# Patient Record
Sex: Female | Born: 1940 | Race: Black or African American | Hispanic: No | Marital: Married | Smoking: Never smoker
Health system: Southern US, Community
[De-identification: ages and names within clinical notes are randomized; demographics above are authoritative.]

## PROBLEM LIST (undated history)

## (undated) DIAGNOSIS — D751 Secondary polycythemia: Secondary | ICD-10-CM

## (undated) DIAGNOSIS — K219 Gastro-esophageal reflux disease without esophagitis: Secondary | ICD-10-CM

## (undated) DIAGNOSIS — E119 Type 2 diabetes mellitus without complications: Secondary | ICD-10-CM

## (undated) DIAGNOSIS — B029 Zoster without complications: Secondary | ICD-10-CM

## (undated) DIAGNOSIS — E0590 Thyrotoxicosis, unspecified without thyrotoxic crisis or storm: Secondary | ICD-10-CM

## (undated) DIAGNOSIS — I10 Essential (primary) hypertension: Secondary | ICD-10-CM

## (undated) DIAGNOSIS — E785 Hyperlipidemia, unspecified: Secondary | ICD-10-CM

## (undated) DIAGNOSIS — J189 Pneumonia, unspecified organism: Secondary | ICD-10-CM

## (undated) HISTORY — DX: Pneumonia, unspecified organism: J18.9

## (undated) HISTORY — DX: Zoster without complications: B02.9

## (undated) HISTORY — PX: COLONOSCOPY: SHX174

## (undated) HISTORY — PX: DENTAL SURGERY: SHX609

## (undated) HISTORY — DX: Secondary polycythemia: D75.1

## (undated) HISTORY — DX: Essential (primary) hypertension: I10

## (undated) HISTORY — DX: Gastro-esophageal reflux disease without esophagitis: K21.9

## (undated) HISTORY — DX: Hyperlipidemia, unspecified: E78.5

## (undated) HISTORY — DX: Type 2 diabetes mellitus without complications: E11.9

## (undated) HISTORY — DX: Thyrotoxicosis, unspecified without thyrotoxic crisis or storm: E05.90

---

## 2004-05-18 ENCOUNTER — Emergency Department (HOSPITAL_COMMUNITY): Admission: EM | Admit: 2004-05-18 | Discharge: 2004-05-18 | Payer: Self-pay | Admitting: Family Medicine

## 2004-05-28 ENCOUNTER — Emergency Department (HOSPITAL_COMMUNITY): Admission: EM | Admit: 2004-05-28 | Discharge: 2004-05-28 | Payer: Self-pay | Admitting: Family Medicine

## 2008-06-03 ENCOUNTER — Other Ambulatory Visit: Admission: RE | Admit: 2008-06-03 | Discharge: 2008-06-03 | Payer: Self-pay | Admitting: Family Medicine

## 2013-01-24 DIAGNOSIS — R21 Rash and other nonspecific skin eruption: Secondary | ICD-10-CM | POA: Insufficient documentation

## 2013-10-23 ENCOUNTER — Encounter (HOSPITAL_COMMUNITY): Payer: Self-pay | Admitting: Emergency Medicine

## 2013-10-23 ENCOUNTER — Emergency Department (HOSPITAL_COMMUNITY)
Admission: EM | Admit: 2013-10-23 | Discharge: 2013-10-24 | Disposition: A | Discharge status: Home / Self Care | Payer: Medicare Other | Attending: Emergency Medicine | Admitting: Emergency Medicine

## 2013-10-23 ENCOUNTER — Emergency Department (INDEPENDENT_AMBULATORY_CARE_PROVIDER_SITE_OTHER)
Admission: EM | Admit: 2013-10-23 | Discharge: 2013-10-23 | Disposition: A | Discharge status: Nursing Home (Includes ICF) | Payer: Medicare Other | Source: Home / Self Care | Attending: Emergency Medicine | Admitting: Emergency Medicine

## 2013-10-23 DIAGNOSIS — N390 Urinary tract infection, site not specified: Secondary | ICD-10-CM

## 2013-10-23 DIAGNOSIS — E119 Type 2 diabetes mellitus without complications: Secondary | ICD-10-CM | POA: Insufficient documentation

## 2013-10-23 DIAGNOSIS — R030 Elevated blood-pressure reading, without diagnosis of hypertension: Secondary | ICD-10-CM | POA: Insufficient documentation

## 2013-10-23 LAB — URINALYSIS, ROUTINE W REFLEX MICROSCOPIC
Bilirubin Urine: NEGATIVE
Glucose, UA: >1000 mg/dL — AB
Ketones, ur: 40 mg/dL — AB
NITRITE: POSITIVE — AB
PROTEIN: 100 mg/dL — AB
Specific Gravity, Urine: 1.034 — ABNORMAL HIGH (ref 1.005–1.030)
UROBILINOGEN UA: 1 mg/dL (ref 0.0–1.0)
pH: 5.5 (ref 5.0–8.0)

## 2013-10-23 LAB — COMPREHENSIVE METABOLIC PANEL
ALT: 23 U/L (ref 0–35)
AST: 21 U/L (ref 0–37)
Albumin: 3.7 g/dL (ref 3.5–5.2)
Alkaline Phosphatase: 146 U/L — ABNORMAL HIGH (ref 39–117)
BUN: 16 mg/dL (ref 6–23)
CALCIUM: 10.3 mg/dL (ref 8.4–10.5)
CO2: 27 mEq/L (ref 19–32)
Chloride: 93 mEq/L — ABNORMAL LOW (ref 96–112)
Creatinine, Ser: 0.63 mg/dL (ref 0.50–1.10)
GFR calc Af Amer: >90 mL/min (ref >90)
GFR, EST NON AFRICAN AMERICAN: 87 mL/min — AB (ref >90)
Glucose, Bld: 415 mg/dL — ABNORMAL HIGH (ref 70–99)
Potassium: 3.7 mEq/L (ref 3.7–5.3)
SODIUM: 135 meq/L — AB (ref 137–147)
Total Bilirubin: 0.9 mg/dL (ref 0.3–1.2)
Total Protein: 8.7 g/dL — ABNORMAL HIGH (ref 6.0–8.3)

## 2013-10-23 LAB — CBC WITH DIFFERENTIAL/PLATELET
BASOS ABS: 0 10*3/uL (ref 0.0–0.1)
Basophils Relative: 0 % (ref 0–1)
EOS PCT: 0 % (ref 0–5)
Eosinophils Absolute: 0 10*3/uL (ref 0.0–0.7)
HCT: 44.6 % (ref 36.0–46.0)
Hemoglobin: 15.9 g/dL — ABNORMAL HIGH (ref 12.0–15.0)
LYMPHS PCT: 11 % — AB (ref 12–46)
Lymphs Abs: 2 10*3/uL (ref 0.7–4.0)
MCH: 31.3 pg (ref 26.0–34.0)
MCHC: 35.7 g/dL (ref 30.0–36.0)
MCV: 87.8 fL (ref 78.0–100.0)
Monocytes Absolute: 1.3 10*3/uL — ABNORMAL HIGH (ref 0.1–1.0)
Monocytes Relative: 7 % (ref 3–12)
Neutro Abs: 14.6 10*3/uL — ABNORMAL HIGH (ref 1.7–7.7)
Neutrophils Relative %: 82 % — ABNORMAL HIGH (ref 43–77)
PLATELETS: 482 10*3/uL — AB (ref 150–400)
RBC: 5.08 MIL/uL (ref 3.87–5.11)
RDW: 15.5 % (ref 11.5–15.5)
WBC: 17.8 10*3/uL — AB (ref 4.0–10.5)

## 2013-10-23 LAB — URINE MICROSCOPIC-ADD ON

## 2013-10-23 LAB — POCT URINALYSIS DIP (DEVICE)
Bilirubin Urine: NEGATIVE
Glucose, UA: >=1000 mg/dL — AB
KETONES UR: 40 mg/dL — AB
Nitrite: POSITIVE — AB
PH: 5.5 (ref 5.0–8.0)
PROTEIN: 100 mg/dL — AB
Specific Gravity, Urine: 1.01 (ref 1.005–1.030)
Urobilinogen, UA: 0.2 mg/dL (ref 0.0–1.0)

## 2013-10-23 LAB — POCT I-STAT, CHEM 8
BUN: 16 mg/dL (ref 6–23)
CREATININE: 0.7 mg/dL (ref 0.50–1.10)
Calcium, Ion: 1.23 mmol/L (ref 1.13–1.30)
Chloride: 99 mEq/L (ref 96–112)
Glucose, Bld: 420 mg/dL — ABNORMAL HIGH (ref 70–99)
HCT: 50 % — ABNORMAL HIGH (ref 36.0–46.0)
Hemoglobin: 17 g/dL — ABNORMAL HIGH (ref 12.0–15.0)
Potassium: 4.1 mEq/L (ref 3.7–5.3)
Sodium: 139 mEq/L (ref 137–147)
TCO2: 26 mmol/L (ref 0–100)

## 2013-10-23 LAB — CBG MONITORING, ED: Glucose-Capillary: 390 mg/dL — ABNORMAL HIGH (ref 70–99)

## 2013-10-23 MED ORDER — SODIUM CHLORIDE 0.9 % IV SOLN
INTRAVENOUS | Status: DC
  Filled 2013-10-23: qty 1

## 2013-10-23 MED ORDER — INSULIN ASPART 100 UNIT/ML ~~LOC~~ SOLN
10.0000 [IU] | Freq: Once | SUBCUTANEOUS | Status: AC
  Administered 2013-10-23: 10 [IU] via SUBCUTANEOUS
  Filled 2013-10-23: qty 1

## 2013-10-23 MED ORDER — SODIUM CHLORIDE 0.9 % IV BOLUS (SEPSIS)
1000.0000 mL | Freq: Once | INTRAVENOUS | Status: AC
  Administered 2013-10-23: 1000 mL via INTRAVENOUS

## 2013-10-23 NOTE — ED Notes (Signed)
Pt  Reports  Low  abd pain  With  Frequency  Of  Urination            Symptoms  Started  Yesterday      Also she  Noticed  Some  Blood  On         Tissue  After       Wiping

## 2013-10-23 NOTE — ED Provider Notes (Addendum)
Chief Complaint   Chief Complaint  Patient presents with  . Urinary Frequency    History of Present Illness   Brandi Mccormick is a 73 year old female who presents tonight with a one-month history of urinary frequency, lower abdominal pain, dysuria, urgency, and blood in the urine. She denies any fever, chills, nausea, or vomiting. She also has had a one-month history of polyuria, polydipsia and a 25 pound weight loss. She's felt dizzy and lightheaded. Her appetite has been good. There is a positive family history diabetes but she has no personal history of diabetes.  Review of Systems   Other than as noted above, the patient denies any of the following symptoms: General:  No fevers, chills, or sweats. GI:  No abdominal pain, back pain, nausea, vomiting, diarrhea, or constipation. GU:  No dysuria, frequency, urgency, hematuria, or incontinence. GYN:  No discharge, itching, vulvar pain or lesions, pelvic pain, or abnormal vaginal bleeding.  [place]   Past medical history, family history, social history, meds, and allergies were reviewed.    Physical Examination     Vital signs:  BP 171/98  Pulse 108  Temp(Src) 98.6 F (37 C) (Oral)  Resp 18  SpO2 97% Gen:  Alert, oriented, in no distress. Lungs:  Clear to auscultation, no wheezes, rales or rhonchi. Heart:  Regular rhythm, no gallop or murmer. Abdomen:  Flat and soft. There was slight suprapubic pain to palpation.  No guarding, or rebound.  No hepato-splenomegaly or mass.  Bowel sounds were normally active.  No hernia. Back:  No CVA tenderness.  Skin:  Clear, warm and dry.  Labs   Results for orders placed during the hospital encounter of 10/23/13  POCT URINALYSIS DIP (DEVICE)      Result Value Ref Range   Glucose, UA >=1000 (*) NEGATIVE mg/dL   Bilirubin Urine NEGATIVE  NEGATIVE   Ketones, ur 40 (*) NEGATIVE mg/dL   Specific Gravity, Urine 1.010  1.005 - 1.030   Hgb urine dipstick LARGE (*) NEGATIVE   pH 5.5  5.0 - 8.0    Protein, ur 100 (*) NEGATIVE mg/dL   Urobilinogen, UA 0.2  0.0 - 1.0 mg/dL   Nitrite POSITIVE (*) NEGATIVE   Leukocytes, UA TRACE (*) NEGATIVE  POCT I-STAT, CHEM 8      Result Value Ref Range   Sodium 139  137 - 147 mEq/L   Potassium 4.1  3.7 - 5.3 mEq/L   Chloride 99  96 - 112 mEq/L   BUN 16  6 - 23 mg/dL   Creatinine, Ser 0.70  0.50 - 1.10 mg/dL   Glucose, Bld 420 (*) 70 - 99 mg/dL   Calcium, Ion 1.23  1.13 - 1.30 mmol/L   TCO2 26  0 - 100 mmol/L   Hemoglobin 17.0 (*) 12.0 - 15.0 g/dL   HCT 50.0 (*) 36.0 - 46.0 %     A urine culture was obtained.  Results are pending at this time and we will call about any positive results.  Assessment   The primary encounter diagnosis was UTI (lower urinary tract infection). A diagnosis of Type 2 diabetes mellitus was also pertinent to this visit.   No evidence of pyelonephritis.  She has new onset type 2 diabetes. She will need glycemic control and rehydration.  Plan   The patient was transferred to the ED via shuttle in stable condition.  Medical Decision Making   73 year old female has a 1 month history of urinary frequency, urgency, dysuria, and  hematuria.  She also has a 1 month history of polyuria, polydipsia and a 25 lb wt loss.  No previous history of DM.  Her UA is positive for nitrite, WBCs and RBCs.  Her iStat reveals a glucose of 420.  She needs blood glucose control.  She does have orthostatic changes on her VS, so she may be mildly dehydrated and in need of rehydration as well.       Harden Mo, MD 10/23/13 2008  Harden Mo, MD 10/23/13 2008  Harden Mo, MD 10/23/13 2010

## 2013-10-23 NOTE — ED Notes (Signed)
[place], PA at bedside. 

## 2013-10-23 NOTE — ED Notes (Signed)
Pt. transferred from [facility] , reports urinary frequency for several days and elevated blood sugar ( 420 ) this evening . Respirations unlabored / alert and oriented .

## 2013-10-23 NOTE — Discharge Instructions (Signed)
We have determined that your problem requires further evaluation in the emergency department.  We will take care of your transport there.  Once at the emergency department, you will be evaluated by a provider and they will order whatever treatment or tests they deem necessary.  We cannot guarantee that they will do any specific test or do any specific treatment.  ° °

## 2013-10-23 NOTE — ED Notes (Signed)
cbg was 390.

## 2013-10-24 LAB — CBG MONITORING, ED: Glucose-Capillary: 259 mg/dL — ABNORMAL HIGH (ref 70–99)

## 2013-10-24 MED ORDER — CEPHALEXIN 250 MG PO CAPS
500.0000 mg | ORAL_CAPSULE | Freq: Once | ORAL | Status: AC
  Administered 2013-10-24: 500 mg via ORAL
  Filled 2013-10-24: qty 2

## 2013-10-24 MED ORDER — CEPHALEXIN 500 MG PO CAPS
500.0000 mg | ORAL_CAPSULE | Freq: Four times a day (QID) | ORAL | Status: DC
Start: 2013-10-24 — End: 2013-11-15

## 2013-10-24 MED ORDER — LISINOPRIL 10 MG PO TABS: 10.0000 mg | ORAL_TABLET | Freq: Once | ORAL | Status: DC

## 2013-10-24 MED ORDER — METFORMIN HCL 850 MG PO TABS: 850.0000 mg | ORAL_TABLET | Freq: Every day | ORAL | Status: DC

## 2013-10-24 NOTE — Discharge Instructions (Signed)
Antibiotic Medication Antibiotics are among the most frequently prescribed medicines. Antibiotics cure illness by assisting our body to injure or kill the bacteria that cause infection. While antibiotics are useful to treat a wide variety of infections they are useless against viruses. Antibiotics cannot cure colds, flu, or other viral infections.  There are many types of antibiotics available. Your caregiver will decide which antibiotic will be useful for an illness. Never take or give someone else's antibiotics or left over medicine. Your caregiver may also take into account:  Allergies.  The cost of the medicine.  Dosing schedules.  Taste.  Common side effects when choosing an antibiotic for an infection. Ask your caregiver if you have questions about why a certain medicine was chosen. HOME CARE INSTRUCTIONS Read all instructions and labels on medicine bottles carefully. Some antibiotics should be taken on an empty stomach while others should be taken with food. Taking antibiotics incorrectly may reduce how well they work. Some antibiotics need to be kept in the refrigerator. Others should be kept at room temperature. Ask your caregiver or pharmacist if you do not understand how to give the medicine. Be sure to give the amount of medicine your caregiver has prescribed. Even if you feel better and your symptoms improve, bacteria may still remain alive in the body. Taking all of the medicine will prevent:  The infection from returning and becoming harder to treat.  Complications from partially treated infections. If there is any medicine left over after you have taken the medicine as your caregiver has instructed, throw the medicine away. Be sure to tell your caregiver if you:  Are allergic to any medicines.  Are pregnant or intend to become pregnant while using this medicine.  Are breastfeeding.  Are taking any other prescription, non-prescription medicine, or herbal  remedies.  Have any other medical conditions or problems you have not already discussed. If you are taking birth control pills, they may not work while you are on antibiotics. To avoid unwanted pregnancy:  Continue taking your birth control pills as usual.  Use a second form of birth control (such as condoms) while you are taking antibiotic medicine.  When you finish taking the antibiotic medicine, continue using the second form of birth control until you are finished with your current 1 month cycle of birth control pills. Try not to miss any doses of medicine. If you miss a dose, take it as soon as possible. However, if it is almost time for the next dose and the dosing schedule is:  2 doses a day, take the missed dose and the next dose 5 to 6 hours apart.  3 or more doses a day, take the missed dose and the next dose 2 to 4 hours apart, then go back to the normal schedule.  If you are unable to make up a missed dose, take the next scheduled dose on time and complete the missed dose at the end of the prescribed time for your medicine. SIDE EFFECTS TO TAKING ANTIBIOTICS Common side effects to antibiotic use include:  Soft stools or diarrhea.  Mild stomach upset.  Sun sensitivity. SEEK MEDICAL CARE IF:   If you get worse or do not improve within a few days of starting the medicine.  Vomiting develops.  Diaper rash or rash on the genitals appears.  Vaginal itching occurs.  White patches appear on the tongue or in the mouth.  Severe watery diarrhea and abdominal cramps occur.  Signs of an allergy develop (hives, unknown  itchy rash appears). STOP TAKING THE ANTIBIOTIC. SEEK IMMEDIATE MEDICAL CARE IF:   Urine turns dark or blood colored.  Skin turns yellow.  Easy bruising or bleeding occurs.  Joint pain or muscle aches occur.  Fever returns.  Severe headache occurs.  Signs of an allergy develop (trouble breathing, wheezing, swelling of the lips, face or tongue,  fainting, or blisters on the skin or in the mouth). STOP TAKING THE ANTIBIOTIC. Document Released: 05/01/2004 Document Revised: 11/11/2011 Document Reviewed: 05/11/2009 [facility] Patient Information 2014 [place].  Blood Glucose Monitoring, Adult Monitoring your blood glucose (also know as blood sugar) helps you to manage your diabetes. It also helps you and your health care provider monitor your diabetes and determine how well your treatment plan is working. WHY SHOULD YOU MONITOR YOUR BLOOD GLUCOSE?  It can help you understand how food, exercise, and medicine affect your blood glucose.  It allows you to know what your blood glucose is at any given moment. You can quickly tell if you are having low blood glucose (hypoglycemia) or high blood glucose (hyperglycemia).  It can help you and your health care provider know how to adjust your medicines.  It can help you understand how to manage an illness or adjust medicine for exercise. WHEN SHOULD YOU TEST? Your health care provider will help you decide how often you should check your blood glucose. This may depend on the type of diabetes you have, your diabetes control, or the types of medicines you are taking. Be sure to write down all of your blood glucose readings so that this information can be reviewed with your health care provider. See below for examples of testing times that your health care provider may suggest. Type 1 Diabetes  Test 4 times a day if you are in good control, using an insulin pump, or perform multiple daily injections.  If your diabetes is not well-controlled or if you are sick, you may need to monitor more often.  It is a good idea to also monitor:  Before and after exercise.  Between meals and 2 hours after a meal.  Occasionally between 2:00 to 3:00 am. Type 2 Diabetes  It can vary with each person, but generally, if you are on insulin, test 4 times a day.  If you take medicines by mouth (orally), test 2  times a day.  If you are on a controlled diet, test once a day.  If your diabetes is not well controlled or if you are sick, you may need to monitor more often. HOW TO MONITOR YOUR BLOOD GLUCOSE Supplies Needed  Blood glucose meter.  Test strips for your meter. Each meter has its own strips. You must use the strips that go with your own meter.  A pricking needle (lancet).  A device that holds the lancet (lancing device).  A journal or log book to write down your results. Procedure  Wash your hands with soap and water. Alcohol is not preferred.  Prick the side of your finger (not the tip) with the lancet.  Gently milk the finger until a small drop of blood appears.  Follow the instructions that come with your meter for inserting the test strip, applying blood to the strip, and using your blood glucose meter. Other Areas to Get Blood for Testing Some meters allow you to use other areas of your body (other than your finger) to test your blood. These areas are called alternative sites. The most common alternative sites are:  The forearm.  The thigh.  The back area of the lower leg.  The palm of the hand. The blood flow in these areas is slower. Therefore, the blood glucose values you get may be delayed, and the numbers are different from what you would get from your fingers. Do not use alternative sites if you think you are having hypoglycemia. Your reading will not be accurate. Always use a finger if you are having hypoglycemia. Also, if you cannot feel your lows (hypoglycemia unawareness), always use your fingers for your blood glucose checks. ADDITIONAL TIPS FOR GLUCOSE MONITORING  Do not reuse lancets.  Always carry your supplies with you.  All blood glucose meters have a 24-hour "hotline" number to call if you have questions or need help.  Adjust (calibrate) your blood glucose meter with a control solution after finishing a few boxes of strips. BLOOD GLUCOSE RECORD  KEEPING It is a good idea to keep a daily record or log of your blood glucose readings. Most glucose meters, if not all, keep your glucose records stored in the meter. Some meters come with the ability to download your records to your home computer. Keeping a record of your blood glucose readings is especially helpful if you are wanting to look for patterns. Make notes to go along with the blood glucose readings because you might forget what happened at that exact time. Keeping good records helps you and your health care provider to work together to achieve good diabetes management.  Document Released: 08/22/2003 Document Revised: 04/21/2013 Document Reviewed: 01/11/2013 [facility] Patient Information 2014 [place].  Diabetes Meal Planning Guide The diabetes meal planning guide is a tool to help you plan your meals and snacks. It is important for people with diabetes to manage their blood glucose (sugar) levels. Choosing the right foods and the right amounts throughout your day will help control your blood glucose. Eating right can even help you improve your blood pressure and reach or maintain a healthy weight. CARBOHYDRATE COUNTING MADE EASY When you eat carbohydrates, they turn to sugar. This raises your blood glucose level. Counting carbohydrates can help you control this level so you feel better. When you plan your meals by counting carbohydrates, you can have more flexibility in what you eat and balance your medicine with your food intake. Carbohydrate counting simply means adding up the total amount of carbohydrate grams in your meals and snacks. Try to eat about the same amount at each meal. Foods with carbohydrates are listed below. Each portion below is 1 carbohydrate serving or 15 grams of carbohydrates. Ask your dietician how many grams of carbohydrates you should eat at each meal or snack. Grains and Starches  1 slice bread.   English muffin or hotdog/hamburger bun.   cup cold  cereal (unsweetened).   cup cooked pasta or rice.   cup starchy vegetables (corn, potatoes, peas, beans, winter squash).  1 tortilla (6 inches).   bagel.  1 waffle or pancake (size of a CD).   cup cooked cereal.  4 to 6 small crackers. *Whole grain is recommended. Fruit  1 cup fresh unsweetened berries, melon, papaya, pineapple.  1 small fresh fruit.   banana or mango.   cup fruit juice (4 oz unsweetened).   cup canned fruit in natural juice or water.  2 tbs dried fruit.  12 to 15 grapes or cherries. Milk and Yogurt  1 cup fat-free or 1% milk.  1 cup soy milk.  6 oz light yogurt with sugar-free sweetener.  6  oz low-fat soy yogurt.  6 oz plain yogurt. Vegetables  1 cup raw or  cup cooked is counted as 0 carbohydrates or a "free" food.  If you eat 3 or more servings at 1 meal, count them as 1 carbohydrate serving. Other Carbohydrates   oz chips or pretzels.   cup ice cream or frozen yogurt.   cup sherbet or sorbet.  2 inch square cake, no frosting.  1 tbs honey, sugar, jam, jelly, or syrup.  2 small cookies.  3 squares of graham crackers.  3 cups popcorn.  6 crackers.  1 cup broth-based soup.  Count 1 cup casserole or other mixed foods as 2 carbohydrate servings.  Foods with less than 20 calories in a serving may be counted as 0 carbohydrates or a "free" food. You may want to purchase a book or computer software that lists the carbohydrate gram counts of different foods. In addition, the nutrition facts panel on the labels of the foods you eat are a good source of this information. The label will tell you how big the serving size is and the total number of carbohydrate grams you will be eating per serving. Divide this number by 15 to obtain the number of carbohydrate servings in a portion. Remember, 1 carbohydrate serving equals 15 grams of carbohydrate. SERVING SIZES Measuring foods and serving sizes helps you make sure you are  getting the right amount of food. The list below tells how big or small some common serving sizes are.  1 oz.........4 stacked dice.  3 oz........Marland KitchenDeck of cards.  1 tsp.......Marland KitchenTip of little finger.  1 tbs......Marland KitchenMarland KitchenThumb.  2 tbs.......Marland KitchenGolf ball.   cup......Marland KitchenHalf of a fist.  1 cup.......Marland KitchenA fist. SAMPLE DIABETES MEAL PLAN Below is a sample meal plan that includes foods from the grain and starches, dairy, vegetable, fruit, and meat groups. A dietician can individualize a meal plan to fit your calorie needs and tell you the number of servings needed from each food group. However, controlling the total amount of carbohydrates in your meal or snack is more important than making sure you include all of the food groups at every meal. You may interchange carbohydrate containing foods (dairy, starches, and fruits). The meal plan below is an example of a 2000 calorie diet using carbohydrate counting. This meal plan has 17 carbohydrate servings. Breakfast  1 cup oatmeal (2 carb servings).   cup light yogurt (1 carb serving).  1 cup blueberries (1 carb serving).   cup almonds. Snack  1 large apple (2 carb servings).  1 low-fat string cheese stick. Lunch  Chicken breast salad.  1 cup spinach.   cup chopped tomatoes.  2 oz chicken breast, sliced.  2 tbs low-fat [place] dressing.  12 whole-wheat crackers (2 carb servings).  12 to 15 grapes (1 carb serving).  1 cup low-fat milk (1 carb serving). Snack  1 cup carrots.   cup hummus (1 carb serving). Dinner  3 oz broiled salmon.  1 cup brown rice (3 carb servings). Snack  1  cups steamed broccoli (1 carb serving) drizzled with 1 tsp olive oil and lemon juice.  1 cup light pudding (2 carb servings). DIABETES MEAL PLANNING WORKSHEET Your dietician can use this worksheet to help you decide how many servings of foods and what types of foods are right for you.  BREAKFAST Food Group and Servings / Carb  Servings Grain/Starches __________________________________ Dairy __________________________________________ Vegetable ______________________________________ Fruit ___________________________________________ Meat __________________________________________ Fat ____________________________________________ LUNCH Food Group and Servings / Carb Servings  Grain/Starches ___________________________________ Dairy ___________________________________________ Fruit ____________________________________________ Meat ___________________________________________ Fat _____________________________________________ Wonda Cheng Food Group and Servings / Carb Servings Grain/Starches ___________________________________ Dairy ___________________________________________ Fruit ____________________________________________ Meat ___________________________________________ Fat _____________________________________________ SNACKS Food Group and Servings / Carb Servings Grain/Starches ___________________________________ Dairy ___________________________________________ Vegetable _______________________________________ Fruit ____________________________________________ Meat ___________________________________________ Fat _____________________________________________ DAILY TOTALS Starches _________________________ Vegetable ________________________ Fruit ____________________________ Dairy ____________________________ Meat ____________________________ Fat ______________________________ Document Released: 05/16/2005 Document Revised: 11/11/2011 Document Reviewed: 03/27/2009 [employer] Patient Information 2014 [employer].  Diets for Diabetes, Food Labeling Look at food labels to help you decide how much of a product you can eat. You will want to check the amount of total carbohydrate in a serving to see how the food fits into your meal plan. In the list of ingredients, the ingredient present in the largest amount by  weight must be listed first, followed by the other ingredients in descending order. STANDARD OF IDENTITY Most products have a list of ingredients. However, foods that the Food and Drug Administration (FDA) has given a standard of identity do not need a list of ingredients. A standard of identity means that a food must contain certain ingredients if it is called a particular name. Examples are mayonnaise, peanut butter, ketchup, jelly, and cheese. LABELING TERMS There are many terms found on food labels. Some of these terms have specific definitions. Some terms are regulated by the FDA, and the FDA has clearly specified how they can be used. Others are not regulated or well-defined and can be misleading and confusing. SPECIFICALLY DEFINED TERMS Nutritive Sweetener.  A sweetener that contains calories,such as table sugar or honey. Nonnutritive Sweetener.  A sweetener with few or no calories,such as saccharin, aspartame, sucralose, and cyclamate. LABELING TERMS REGULATED BY THE FDA Free.  The product contains only a tiny or small amount of fat, cholesterol, sodium, sugar, or calories. For example, a "fat-free" product will contain less than 0.5 g of fat per serving. Low.  A food described as "low" in fat, saturated fat, cholesterol, sodium, or calories could be eaten fairly often without exceeding dietary guidelines. For example, "low in fat" means no more than 3 g of fat per serving. Lean.  "Lean" and "extra lean" are U.S. Department of Agriculture Scientist, research (physical sciences)) terms for use on meat and poultry products. "Lean" means the product contains less than 10 g of fat, 4 g of saturated fat, and 95 mg of cholesterol per serving. "Lean" is not as low in fat as a product labeled "low." Extra Lean.  "Extra lean" means the product contains less than 5 g of fat, 2 g of saturated fat, and 95 mg of cholesterol per serving. While "extra lean" has less fat than "lean," it is still higher in fat than a product labeled  "low." Reduced, Less, Fewer.  A diet product that contains 25% less of a nutrient or calories than the regular version. For example, hot dogs might be labeled "25% less fat than our regular hot dogs." Light/Lite.  A diet product that contains  fewer calories or  the fat of the original. For example, "light in sodium" means a product with  the usual sodium. More.  One serving contains at least 10% more of the daily value of a vitamin, mineral, or fiber than usual. Good Source Of.  One serving contains 10% to 19% of the daily value for a particular vitamin, mineral, or fiber. Excellent Source Of.  One serving contains 20% or more of the daily value for a particular nutrient. Other terms used might be "high in"  or "rich in." Enriched or Fortified.  The product contains added vitamins, minerals, or protein. Nutrition labeling must be used on enriched or fortified foods. Imitation.  The product has been altered so that it is lower in protein, vitamins, or minerals than the usual food,such as imitation peanut butter. Total Fat.  The number listed is the total of all fat found in a serving of the product. Under total fat, food labels must list saturated fat and trans fat, which are associated with raising bad cholesterol and an increased risk of heart blood vessel disease. Saturated Fat.  Mainly fats from animal-based sources. Some examples are red meat, cheese, cream, whole milk, and coconut oil. Trans Fat.  Found in some fried snack foods, packaged foods, and fried restaurant foods. It is recommended you eat as close to 0 g of trans fat as possible, since it raises bad cholesterol and lowers good cholesterol. Polyunsaturated and Monounsaturated Fats.  More healthful fats. These fats are from plant sources. Total Carbohydrate.  The number of carbohydrate grams in a serving of the product. Under total carbohydrate are listed the other carbohydrate sources, such as dietary fiber and  sugars. Dietary Fiber.  A carbohydrate from plant sources. Sugars.  Sugars listed on the label contain all naturally occurring sugars as well as added sugars. LABELING TERMS NOT REGULATED BY THE FDA Sugarless.  Table sugar (sucrose) has not been added. However, the manufacturer may use another form of sugar in place of sucrose to sweeten the product. For example, sugar alcohols are used to sweeten foods. Sugar alcohols are a form of sugar but are not table sugar. If a product contains sugar alcohols in place of sucrose, it can still be labeled "sugarless." Low Salt, Salt-Free, Unsalted, No Salt, No Salt Added, Without Added Salt.  Food that is usually processed with salt has been made without salt. However, the food may contain sodium-containing additives, such as preservatives, leavening agents, or flavorings. Natural.  This term has no legal meaning. Organic.  Foods that are certified as organic have been inspected and approved by the USDA to ensure they are produced without pesticides, fertilizers containing synthetic ingredients, bioengineering, or ionizing radiation. Document Released: 08/22/2003 Document Revised: 11/11/2011 Document Reviewed: 03/09/2009 [facility] Patient Information 2014 [place], [place].  Daily Diabetes Record Week of _____________________________ Date: _________  Elita Boone, BG/Medications: ________________ / __________________________________________________________  LUNCH, BG/Medications: ____________________ / __________________________________________________________  Wonda Cheng, BG/Medications: ___________________ / __________________________________________________________  BEDTIME, BG/Medications: __________________ / __________________________________________________________ Date: _________  Elita Boone, BG/Medications: ________________ / __________________________________________________________  LUNCH, BG/Medications: ____________________ /  __________________________________________________________  Wonda Cheng, BG/Medications: ___________________ / __________________________________________________________  BEDTIME, BG/Medications: __________________ / __________________________________________________________ Date: _________  Elita Boone, BG/Medications: ________________ / __________________________________________________________  LUNCH, BG/Medications: ____________________ / __________________________________________________________  Wonda Cheng, BG/Medications: ___________________ / __________________________________________________________  BEDTIME, BG/Medications: __________________ / __________________________________________________________ Date: _________  Elita Boone, BG/Medications: ________________ / __________________________________________________________  LUNCH, BG/Medications: ____________________ / __________________________________________________________  Wonda Cheng, BG/Medications: ___________________ / __________________________________________________________  BEDTIME, BG/Medications: __________________ / __________________________________________________________ Date: _________  Elita Boone, BG/Medications: ________________ / __________________________________________________________  LUNCH, BG/Medications: ____________________ / __________________________________________________________  Wonda Cheng, BG/Medications: ___________________ / __________________________________________________________  BEDTIME, BG/Medications: __________________ / __________________________________________________________ Date: _________  Elita Boone, BG/Medications: ________________ / __________________________________________________________  LUNCH, BG/Medications: ____________________ / __________________________________________________________  Wonda Cheng, BG/Medications: ___________________ /  __________________________________________________________  BEDTIME, BG/Medications: __________________ / __________________________________________________________ Date: _________  Elita Boone, BG/Medications: ________________ / __________________________________________________________  LUNCH, BG/Medications: ____________________ / __________________________________________________________  Wonda Cheng, BG/Medications: ___________________ / __________________________________________________________  BEDTIME, BG/Medications: __________________ / __________________________________________________________ Notes: __________________________________________________________________________________________________ Document Released: 07/23/2004 Document Revised: 11/11/2011 Document Reviewed: 05/17/2009 [employer] Patient Information 2014 [employer].  Urinary Tract Infection  Urinary tract infections (UTIs) can develop anywhere along your urinary tract. Your urinary tract is your body's drainage system for removing wastes and extra water. Your urinary tract includes two kidneys, two ureters, a bladder, and a urethra. Your kidneys are a pair of bean-shaped organs. Each kidney is about the size of your fist. They are located below your ribs, one on each side of your spine. CAUSES Infections are caused by microbes, which are microscopic organisms, including fungi, viruses, and bacteria. These organisms are so small that they can only be seen through a microscope. Bacteria are the microbes that most commonly cause UTIs. SYMPTOMS  Symptoms of UTIs may vary by age and gender of the patient and by the location of the infection. Symptoms in young women typically include a frequent and intense urge to urinate and a painful, burning feeling in the bladder or urethra during urination. Older women and men are more likely to be tired, shaky, and weak and have muscle aches and abdominal pain. A fever may mean the  infection is in your kidneys. Other symptoms of a kidney infection include pain in your back or sides below the ribs, nausea, and vomiting. DIAGNOSIS To diagnose a UTI, your caregiver will ask you about your symptoms. Your caregiver also will ask to provide a urine sample. The urine sample will be tested for bacteria and white blood cells. White blood cells are made by your body to help fight infection. TREATMENT  Typically, UTIs can be treated with medication. Because most UTIs are caused by a bacterial infection, they usually can be treated with the use of antibiotics. The choice of antibiotic and length of treatment depend on your symptoms and the type of bacteria causing your infection. HOME CARE INSTRUCTIONS  If you were prescribed antibiotics, take them exactly as your caregiver instructs you. Finish the medication even if you feel better after you have only taken some of the medication.  Drink enough water and fluids to keep your urine clear or pale yellow.  Avoid caffeine, tea, and carbonated beverages. They tend to irritate your bladder.  Empty your bladder often. Avoid holding urine for long periods of time.  Empty your bladder before and after sexual intercourse.  After a bowel movement, women should cleanse from front to back. Use each tissue only once. SEEK MEDICAL CARE IF:   You have back pain.  You develop a fever.  Your symptoms do not begin to resolve within 3 days. SEEK IMMEDIATE MEDICAL CARE IF:   You have severe back pain or lower abdominal pain.  You develop chills.  You have nausea or vomiting.  You have continued burning or discomfort with urination. MAKE SURE YOU:   Understand these instructions.  Will watch your condition.  Will get help right away if you are not doing well or get worse. Document Released: 05/29/2005 Document Revised: 02/18/2012 Document Reviewed: 09/27/2011 [facility] Patient Information 2014 [place].

## 2013-10-24 NOTE — ED Provider Notes (Signed)
Medical screening examination/treatment/procedure(s) were conducted as a shared visit with non-physician practitioner(s) and myself.  I personally evaluated the patient during the encounter.  Patient understand and agree with initial ED impression and plan with expectations set for ED visit.   Babette Relic, MD 10/24/13 [phone]

## 2013-10-24 NOTE — ED Provider Notes (Signed)
CSN: [id number]     Arrival date & time 10/23/13  2024 History   First MD Initiated Contact with Patient 10/23/13 2156     Chief Complaint  Patient presents with  . Hyperglycemia  . Urinary Frequency     (Consider location/radiation/quality/duration/timing/severity/associated sxs/prior Treatment) HPI Comments: Patient is 73 year old female who presents here from the [facility] with hyperglycemia,  Initial blood sugar of 420 there, she is not in DKA, has slight increase in anion gap but is also noted to be hypertensive as well.  She reports no history of medical problems.  She will be following up with her husbands, PCP, Dr. Ivy Lynn this coming week.  She is also noted to have an UTI but without evidence of pyelonephritis.  She denies nausea, vomiting, chest pain, shortness of breath, abdominal pain, fever, or chills.  Patient is a 73 y.o. female presenting with hyperglycemia and frequency. The history is provided by the patient. No language interpreter was used.  Hyperglycemia Blood sugar level PTA:  420 Severity:  Severe Onset quality:  Gradual Timing:  Constant Progression:  Worsening Chronicity:  New Diabetes status:  Non-diabetic Relieved by:  Nothing Ineffective treatments:  None tried Associated symptoms: dysuria, increased appetite, increased thirst and polyuria   Associated symptoms: no abdominal pain, no altered mental status, no chest pain, no dehydration, no diaphoresis, no dizziness, no fatigue, no malaise, no nausea, no shortness of breath, no vomiting, no weakness and no weight change   Urinary Frequency Pertinent negatives include no abdominal pain, chest pain, diaphoresis, fatigue, nausea or vomiting.    History reviewed. No pertinent past medical history. History reviewed. No pertinent past surgical history. No family history on file. History  Substance Use Topics  . Smoking status: Never Smoker   . Smokeless tobacco: Not on file  . Alcohol Use: No   OB History   Grav  Para Term Preterm Abortions TAB SAB Ect Mult Living                 Review of Systems  Constitutional: Negative for diaphoresis and fatigue.  Respiratory: Negative for shortness of breath.   Cardiovascular: Negative for chest pain.  Gastrointestinal: Negative for nausea, vomiting and abdominal pain.  Endocrine: Positive for polydipsia and polyuria.  Genitourinary: Positive for dysuria and frequency.  Neurological: Negative for dizziness.  All other systems reviewed and are negative.      Allergies  Review of patient's allergies indicates no known allergies.  Home Medications   Current Outpatient Rx  Name  Route  Sig  Dispense  Refill  . ibuprofen (ADVIL,MOTRIN) 200 MG tablet   Oral   Take 200 mg by mouth every 6 (six) hours as needed for mild pain.          BP 177/82  Pulse 94  Temp(Src) 98 F (36.7 C) (Oral)  Resp 18  Ht 5\' 4"  (1.626 m)  Wt 111 lb 5 oz (50.491 kg)  BMI 19.10 kg/m2  SpO2 99% Physical Exam  Nursing note and vitals reviewed. Constitutional: She is oriented to person, place, and time. She appears well-developed and well-nourished. No distress.  HENT:  Head: Normocephalic and atraumatic.  Right Ear: External ear normal.  Left Ear: External ear normal.  Nose: Nose normal.  Mouth/Throat: Oropharynx is clear and moist. No oropharyngeal exudate.  Eyes: Conjunctivae are normal. Pupils are equal, round, and reactive to light. No scleral icterus.  Neck: Normal range of motion. Neck supple.  Cardiovascular: Normal rate, regular rhythm and  normal heart sounds.  Exam reveals no gallop and no friction rub.   No murmur heard. Pulmonary/Chest: Effort normal and breath sounds normal. No respiratory distress. She has no wheezes. She has no rales. She exhibits no tenderness.  Abdominal: Soft. Bowel sounds are normal. She exhibits no distension. There is no tenderness. There is no rebound and no guarding.  Musculoskeletal: Normal range of motion. She exhibits no  edema and no tenderness.  Lymphadenopathy:    She has no cervical adenopathy.  Neurological: She is alert and oriented to person, place, and time. She exhibits normal muscle tone. Coordination normal.  Skin: Skin is warm and dry. No rash noted. No erythema. No pallor.  Psychiatric: She has a normal mood and affect. Her behavior is normal. Judgment and thought content normal.    ED Course  Procedures (including critical care time) Labs Review Labs Reviewed  URINALYSIS, ROUTINE W REFLEX MICROSCOPIC - Abnormal; Notable for the following:    APPearance CLOUDY (*)    Specific Gravity, Urine 1.034 (*)    Glucose, UA >1000 (*)    Hgb urine dipstick LARGE (*)    Ketones, ur 40 (*)    Protein, ur 100 (*)    Nitrite POSITIVE (*)    Leukocytes, UA SMALL (*)    All other components within normal limits  CBC WITH DIFFERENTIAL - Abnormal; Notable for the following:    WBC 17.8 (*)    Hemoglobin 15.9 (*)    Platelets 482 (*)    Neutrophils Relative % 82 (*)    Neutro Abs 14.6 (*)    Lymphocytes Relative 11 (*)    Monocytes Absolute 1.3 (*)    All other components within normal limits  COMPREHENSIVE METABOLIC PANEL - Abnormal; Notable for the following:    Sodium 135 (*)    Chloride 93 (*)    Glucose, Bld 415 (*)    Total Protein 8.7 (*)    Alkaline Phosphatase 146 (*)    GFR calc non Af Amer 87 (*)    All other components within normal limits  URINE MICROSCOPIC-ADD ON - Abnormal; Notable for the following:    Bacteria, UA FEW (*)    All other components within normal limits  CBG MONITORING, ED - Abnormal; Notable for the following:    Glucose-Capillary 390 (*)    All other components within normal limits  CBG MONITORING, ED   Imaging Review No results found.  EKG Interpretation   None      Medications  sodium chloride 0.9 % bolus 1,000 mL (0 mLs Intravenous Stopped 10/23/13 2339)  insulin aspart (novoLOG) injection 10 Units (10 Units Subcutaneous Given 10/23/13 2305)   12:58  AM Glucose down to 256, plan to discharge patient on metformin to start with then adjusted dosing by PCP.  Patient was noted to be hypertensive here but now blood pressure is 136/78  MDM   DM, type 2 UTI  Patient is otherwise previously healthy 73 year old female who presents to the ED with complaints of hyperglycemia, she was noted to be 420 at the [facility], we have given SQ regular insulin and a liter of fluids and she is normalizing.  Will start the patient on metformin and keflex for the UTI, culture sent from [facility].  Patient to follow up with Dr. Ivy Lynn.    Idalia Needle Joelyn Oms, PA-C 10/24/13 0104

## 2013-10-24 NOTE — ED Notes (Signed)
[place], PA at bedside. 

## 2013-10-26 LAB — URINE CULTURE: Colony Count: >=100000

## 2013-10-26 NOTE — Progress Notes (Signed)
Quick Note:  Results are abnormal as noted, but have been adequately treated. No further action necessary. Patient was transferred to ED. They discharged her on cephalexin. ______

## 2013-10-26 NOTE — ED Notes (Signed)
Urine culture: >100,000 colonies E. Coli.  Pt. was transferred to ED.  Dr. Jake Michaelis said pt. was d/c with Rx. of Keflex which is adequate treatment. Roselyn Meier 10/26/2013

## 2013-11-15 ENCOUNTER — Telehealth: Payer: Self-pay

## 2013-11-15 NOTE — Telephone Encounter (Signed)
Medication and allergies:  Reviewed and updated  90 day supply/mail order: n/a Local pharmacy:  [id number] - [place] ([place]), [place] - [address]   Immunizations due: PNA and Zoster.  Influenza-declined   A/P: Personal, family history and past surgical hx-Updated CCS- less than 5 years ago per patient-normal MMG- many years ago-normal Flu- Declined Tdap- less than 10 years ago per patient PNA- never received Shingles-never received  To Discuss with Provider: Nothing at this time.

## 2013-11-16 ENCOUNTER — Ambulatory Visit (INDEPENDENT_AMBULATORY_CARE_PROVIDER_SITE_OTHER): Payer: Medicare Other | Admitting: Family Medicine

## 2013-11-16 ENCOUNTER — Encounter: Payer: Self-pay | Admitting: Family Medicine

## 2013-11-16 VITALS — BP 120/74 | HR 77 | Temp 97.6°F | Ht 64.0 in | Wt 112.4 lb

## 2013-11-16 DIAGNOSIS — E1165 Type 2 diabetes mellitus with hyperglycemia: Secondary | ICD-10-CM

## 2013-11-16 DIAGNOSIS — Z23 Encounter for immunization: Secondary | ICD-10-CM

## 2013-11-16 DIAGNOSIS — Z1239 Encounter for other screening for malignant neoplasm of breast: Secondary | ICD-10-CM

## 2013-11-16 DIAGNOSIS — E118 Type 2 diabetes mellitus with unspecified complications: Secondary | ICD-10-CM

## 2013-11-16 DIAGNOSIS — E1159 Type 2 diabetes mellitus with other circulatory complications: Secondary | ICD-10-CM

## 2013-11-16 DIAGNOSIS — IMO0002 Reserved for concepts with insufficient information to code with codable children: Secondary | ICD-10-CM

## 2013-11-16 DIAGNOSIS — E2839 Other primary ovarian failure: Secondary | ICD-10-CM

## 2013-11-16 LAB — LIPID PANEL
CHOL/HDL RATIO: 5
Cholesterol: 302 mg/dL — ABNORMAL HIGH (ref 0–200)
HDL: 65.3 mg/dL (ref >39.00)
LDL Cholesterol: 221 mg/dL — ABNORMAL HIGH (ref 0–99)
Triglycerides: 78 mg/dL (ref 0.0–149.0)
VLDL: 15.6 mg/dL (ref 0.0–40.0)

## 2013-11-16 LAB — HEPATIC FUNCTION PANEL
ALK PHOS: 71 U/L (ref 39–117)
ALT: 18 U/L (ref 0–35)
AST: 17 U/L (ref 0–37)
Albumin: 4.1 g/dL (ref 3.5–5.2)
BILIRUBIN TOTAL: 0.6 mg/dL (ref 0.3–1.2)
Bilirubin, Direct: 0.1 mg/dL (ref 0.0–0.3)
Total Protein: 7.5 g/dL (ref 6.0–8.3)

## 2013-11-16 LAB — BASIC METABOLIC PANEL
BUN: 19 mg/dL (ref 6–23)
CO2: 29 mEq/L (ref 19–32)
Calcium: 9.5 mg/dL (ref 8.4–10.5)
Chloride: 103 mEq/L (ref 96–112)
Creatinine, Ser: 0.5 mg/dL (ref 0.4–1.2)
GFR: 117.91 mL/min (ref >60.00)
Glucose, Bld: 146 mg/dL — ABNORMAL HIGH (ref 70–99)
POTASSIUM: 3.8 meq/L (ref 3.5–5.1)
SODIUM: 137 meq/L (ref 135–145)

## 2013-11-16 LAB — HEMOGLOBIN A1C: HEMOGLOBIN A1C: 11.9 % — AB (ref 4.6–6.5)

## 2013-11-16 MED ORDER — ONETOUCH ULTRASOFT LANCETS MISC: Status: DC

## 2013-11-16 MED ORDER — GLUCOSE BLOOD VI STRP: ORAL_STRIP | Status: DC

## 2013-11-16 NOTE — Progress Notes (Signed)
  Subjective:     Brandi Mccormick is a 73 y.o. female who presents with new onset of Type 2 diabetes.. Current symptoms include: hyperglycemia, increase appetite, nausea, visual disturbances, vomitting and weight loss. Patient denies foot ulcerations, hypoglycemia , increased appetite, nausea and vomiting. Evaluation to date has been: fasting blood sugar. Home sugars: patient does not check sugars. Current treatments: none. Last dilated eye exam due.  The following portions of the patient's history were reviewed and updated as appropriate: allergies, current medications, past family history, past medical history, past social history, past surgical history and problem list.  Review of Systems Pertinent items are noted in HPI.    Objective:    BP 120/74  Pulse 77  Temp(Src) 97.6 F (36.4 C) (Oral)  Ht 5\' 4"  (1.626 m)  Wt 112 lb 6.4 oz (50.984 kg)  BMI 19.28 kg/m2  SpO2 97% General appearance: alert, cooperative, appears stated age and no distress Eyes: conjunctivae/corneas clear. PERRL, EOM's intact. Fundi benign. Ears: normal TM's and external ear canals both ears Nose: Nares normal. Septum midline. Mucosa normal. No drainage or sinus tenderness. Throat: lips, mucosa, and tongue normal; teeth and gums normal Neck: no adenopathy, no carotid bruit, no JVD, supple, symmetrical, trachea midline and thyroid not enlarged, symmetric, no tenderness/mass/nodules Back: symmetric, no curvature. ROM normal. No CVA tenderness. Lungs: clear to auscultation bilaterally Heart: S1, S2 normal    @DMFOOTEXAM @  Patient was evaluated for proper footwear and sizing.  Laboratory: No components found with this basename: A1C      Assessment:    Diabetes mellitus Type II, under poor control.    Plan:    Discussed foot care.   1. Type II or unspecified type diabetes mellitus with peripheral circulatory disorders, uncontrolled(250.72) Check labs,  con't meds - Basic metabolic panel - Hemoglobin  A1c - Hepatic function panel - Lipid panel - Microalbumin / creatinine urine ratio - POCT urinalysis dipstick - glucose blood test strip; Use as instructed  Dispense: 100 each; Refill: 12 - Lancets (ONETOUCH ULTRASOFT) lancets; Use as instructed  Dispense: 100 each; Refill: 12  2. Other screening breast examination - MM DIGITAL SCREENING BILATERAL; Future  3. Estrogen deficiency  - DG Bone Density; Future  4. Need for prophylactic vaccination against Streptococcus pneumoniae (pneumococcus)  - Pneumococcal polysaccharide vaccine 23-valent greater than or equal to 2yo subcutaneous/IM  5. Type II or unspecified type diabetes mellitus with unspecified complication, uncontrolled

## 2013-11-16 NOTE — Progress Notes (Signed)
Pre visit review using our clinic review tool, if applicable. No additional management support is needed unless otherwise documented below in the visit note. 

## 2013-11-16 NOTE — Patient Instructions (Signed)

## 2013-11-17 ENCOUNTER — Other Ambulatory Visit: Payer: Self-pay

## 2013-11-17 DIAGNOSIS — E1159 Type 2 diabetes mellitus with other circulatory complications: Secondary | ICD-10-CM

## 2013-11-17 LAB — MICROALBUMIN / CREATININE URINE RATIO
Creatinine,U: 111.6 mg/dL
MICROALB UR: 9.8 mg/dL — AB (ref 0.0–1.9)
Microalb Creat Ratio: 8.8 mg/g (ref 0.0–30.0)

## 2013-11-17 LAB — POCT URINALYSIS DIPSTICK
Bilirubin, UA: NEGATIVE
Blood, UA: NEGATIVE
Glucose, UA: NEGATIVE
KETONES UA: NEGATIVE
Leukocytes, UA: NEGATIVE
Nitrite, UA: NEGATIVE
PH UA: 5
Protein, UA: NEGATIVE
Urobilinogen, UA: 0.2

## 2013-11-17 MED ORDER — ONETOUCH ULTRASOFT LANCETS MISC: Status: DC

## 2013-11-18 ENCOUNTER — Telehealth: Payer: Self-pay | Admitting: Family Medicine

## 2013-11-18 MED ORDER — GLUCOSE BLOOD VI STRP: ORAL_STRIP | Status: DC

## 2013-11-18 MED ORDER — METFORMIN HCL 850 MG PO TABS: 850.0000 mg | ORAL_TABLET | Freq: Every day | ORAL | Status: DC

## 2013-11-18 MED ORDER — ONETOUCH DELICA LANCETS FINE MISC
Status: DC
Start: 2013-11-18 — End: 2014-03-21

## 2013-11-18 NOTE — Telephone Encounter (Signed)
Rx faxed.    KP 

## 2013-11-18 NOTE — Telephone Encounter (Signed)
Patient called and stated that she checked with her pharmacy and they still don't have her refills. Patient needs a refill for Lancets (ONETOUCH ULTRASOFT) lancets, glucose blood test strip and metFORMIN (GLUCOPHAGE) 850 MG tablet.

## 2013-11-24 ENCOUNTER — Telehealth: Payer: Self-pay | Admitting: *Deleted

## 2013-11-24 DIAGNOSIS — E785 Hyperlipidemia, unspecified: Secondary | ICD-10-CM

## 2013-11-24 DIAGNOSIS — E119 Type 2 diabetes mellitus without complications: Secondary | ICD-10-CM

## 2013-11-24 MED ORDER — METFORMIN HCL 850 MG PO TABS: 850.0000 mg | ORAL_TABLET | Freq: Two times a day (BID) | ORAL | Status: DC

## 2013-11-24 MED ORDER — SIMVASTATIN 40 MG PO TABS: 40.0000 mg | ORAL_TABLET | Freq: Every day | ORAL | Status: DC

## 2013-11-24 NOTE — Telephone Encounter (Signed)
Spoke with patient and made aware of recent lab results. Patient was advised to increase metformin to 2 tablets daily and to begin Zocor. Patient also advised that referral to endo haas been recommended. Rx sent to [facility] on [address]

## 2013-11-24 NOTE — Telephone Encounter (Signed)
Message copied by Chilton Greathouse on Wed Nov 24, 2013  2:39 PM ------      Message from: Rosalita Chessman      Created: Thu Nov 18, 2013 10:49 PM       DM uncontrolled--- inc metformin 850 mg to bid #60-- refer to endo      Cholesterol--- LDL goal < 70,  HDL >40,  TG < 150.  Diet and exercise will increase HDL and decrease LDL and TG.  Fish,  Fish Oil, Flaxseed oil will also help increase the HDL and decrease Triglycerides.   Recheck labs in 3 months------ start zocor 40 mg #30  1 po qhs, 2 refills      2724 lipid, hep  250.01  Bmp, hgba1c.             ------

## 2013-12-06 LAB — HM DIABETES EYE EXAM

## 2013-12-14 ENCOUNTER — Ambulatory Visit: Payer: Medicare Other | Admitting: Internal Medicine

## 2013-12-20 ENCOUNTER — Ambulatory Visit (INDEPENDENT_AMBULATORY_CARE_PROVIDER_SITE_OTHER): Payer: Medicare Other | Admitting: Internal Medicine

## 2013-12-20 ENCOUNTER — Encounter: Payer: Self-pay | Admitting: Internal Medicine

## 2013-12-20 VITALS — BP 124/78 | HR 82 | Temp 98.1°F | Resp 12 | Ht 64.0 in | Wt 111.6 lb

## 2013-12-20 DIAGNOSIS — Z8639 Personal history of other endocrine, nutritional and metabolic disease: Secondary | ICD-10-CM

## 2013-12-20 DIAGNOSIS — E1165 Type 2 diabetes mellitus with hyperglycemia: Secondary | ICD-10-CM

## 2013-12-20 DIAGNOSIS — Z862 Personal history of diseases of the blood and blood-forming organs and certain disorders involving the immune mechanism: Secondary | ICD-10-CM

## 2013-12-20 DIAGNOSIS — E118 Type 2 diabetes mellitus with unspecified complications: Principal | ICD-10-CM

## 2013-12-20 DIAGNOSIS — IMO0002 Reserved for concepts with insufficient information to code with codable children: Secondary | ICD-10-CM

## 2013-12-20 LAB — TSH: TSH: 0.28 u[IU]/mL — ABNORMAL LOW (ref 0.35–5.50)

## 2013-12-20 LAB — T3, FREE: T3, Free: 2.9 pg/mL (ref 2.3–4.2)

## 2013-12-20 LAB — T4, FREE: Free T4: 0.97 ng/dL (ref 0.60–1.60)

## 2013-12-20 NOTE — Patient Instructions (Signed)
Please check sugars 2x a day as advised. Continue Metformin 850 mg 2x a daily. Please stop at the lab.  PATIENT INSTRUCTIONS FOR TYPE 2 DIABETES:  **Please join MyChart!** - see attached instructions about how to join   DIET AND EXERCISE Diet and exercise is an important part of diabetic treatment.  We recommended aerobic exercise in the form of brisk walking (working between 40-60% of maximal aerobic capacity, similar to brisk walking) for 150 minutes per week (such as 30 minutes five days per week) along with 3 times per week performing 'resistance' training (using various gauge rubber tubes with handles) 5-10 exercises involving the major muscle groups (upper body, lower body and core) performing 10-15 repetitions (or near fatigue) each exercise. Start at half the above goal but build slowly to reach the above goals. If limited by weight, joint pain, or disability, we recommend daily walking in a swimming pool with water up to waist to reduce pressure from joints while allow for adequate exercise.    BLOOD GLUCOSES Monitoring your blood glucoses is important for continued management of your diabetes. Please check your blood glucoses 2-4 times a day: fasting, before meals and at bedtime (you can rotate these measurements - e.g. one day check before the 3 meals, the next day check before 2 of the meals and before bedtime, etc.   HYPOGLYCEMIA (low blood sugar) Hypoglycemia is usually a reaction to not eating, exercising, or taking too much insulin/ other diabetes drugs.  Symptoms include tremors, sweating, hunger, confusion, headache, etc. Treat IMMEDIATELY with 15 grams of Carbs:   4 glucose tablets    cup regular juice/soda   2 tablespoons raisins   4 teaspoons sugar   1 tablespoon honey Recheck blood glucose in 15 mins and repeat above if still symptomatic/blood glucose <100. Please contact our office at [phone] if you have questions about how to next handle your  insulin.  RECOMMENDATIONS TO REDUCE YOUR RISK OF DIABETIC COMPLICATIONS: * Take your prescribed MEDICATION(S). * Follow a DIABETIC diet: Complex carbs, fiber rich foods, heart healthy fish twice weekly, (monounsaturated and polyunsaturated) fats * AVOID saturated/trans fats, high fat foods, >2,300 mg salt per day. * EXERCISE at least 5 times a week for 30 minutes or preferably daily.  * DO NOT SMOKE OR DRINK more than 1 drink a day. * Check your FEET every day. Do not wear tightfitting shoes. Contact us if you develop an ulcer * See your EYE doctor once a year or more if needed * Get a FLU shot once a year * Get a PNEUMONIA vaccine once before and once after age 10 years  GOALS:  * Your Hemoglobin A1c of <7%  * fasting sugars need to be <130 * after meals sugars need to be <180 (2h after you start eating) * Your Systolic BP should be [id number] or lower  * Your Diastolic BP should be 80 or lower  * Your HDL (Good Cholesterol) should be 40 or higher  * Your LDL (Bad Cholesterol) should be 100 or lower  * Your Triglycerides should be 150 or lower  * Your Urine microalbumin (kidney function) should be <30 * Your Body Mass Index should be 25 or lower   We will be glad to help you achieve these goals. Our telephone number is: [phone].

## 2013-12-20 NOTE — Progress Notes (Signed)
Patient ID: Brandi Mccormick, female   DOB: 1940/11/07, 73 y.o.   MRN: [id number]  HPI: Brandi Mccormick is a 73 y.o.-year-old femalemale, referred by her PCP, Dr.Lowne, for management of DM2, non-insulin-dependent, uncontrolled, without complications and pt also tells me she would like me to investigate her thyroid (h/o hyperthyroidism).  She has a h/o hyperthyroidism, tx with meds ~20 years ago. No other history available and no recent labs to review. No neck compression sxs.  Patient has been diagnosed with diabetes in 10/2013; she has not been started on insulin. Last hemoglobin A1c was: Lab Results  Component Value Date   HGBA1C 11.9* 11/16/2013   Pt is on a regimen of: - Metformin 850 mg po bid  Pt checks her sugars - once a week (her sisters checks her sugars) and they are ~160, ~130: - am: n/c  - 2h after b'fast: n/c - before lunch: n/c - 2h after lunch: n/c - before dinner: n/c - 2h after dinner: n/c - bedtime: n/c - nighttime: n/c ? lows; ? If she  she has hypoglycemia awareness. Highest sugar was ?Marland Kitchen  Meter: OneTouch Verio >> she does not check sugars herself  Pt's meals are: - Breakfast: egg, coffee, juice; sometime - Lunch: No Lunch - Dinner: meat + vegetables - Snacks: 1-2  - no CKD, last BUN/creatinine:  Lab Results  Component Value Date   BUN 19 11/16/2013   CREATININE 0.5 11/16/2013  ACR 8 on 11/16/2013. - last set of lipids: Lab Results  Component Value Date   CHOL 302* 11/16/2013   HDL 65.30 11/16/2013   LDLCALC 221* 11/16/2013   TRIG 78.0 11/16/2013   CHOLHDL 5 11/16/2013  on Simvastatin. - last eye exam was in 12/06/2013. No DR.  - no numbness and tingling in her feet.  I reviewed her chart and she also has a history of HL;   Pt has FH of DM in sisters, brother, mother.    Past Medical History  Diagnosis Date  . Diabetes   . Thyroid disease     Not on med's  . Shingles    Past Surgical History  Procedure Laterality Date  . Dental surgery      History   Social History  . Marital Status: Married    Spouse Name: N/A    Number of Children: 2   Occupational History  . [employer]--     retired   Social History Main Topics  . Smoking status: Never Smoker   . Smokeless tobacco: Not on file  . Alcohol Use: No  . Drug Use: No   Social History Narrative   Exercise-- yard work,  Walking in [employer]   Current Outpatient Prescriptions on File Prior to Visit  Medication Sig Dispense Refill  . glucose blood test strip Check blood sugar once daily.  100 each  12  . Lancets (ONETOUCH ULTRASOFT) lancets Check blood sugar once daily  100 each  12  . metFORMIN (GLUCOPHAGE) 850 MG tablet Take 1 tablet (850 mg total) by mouth 2 (two) times daily with a meal.  60 tablet  2  . ONETOUCH DELICA LANCETS FINE MISC Check blood sugar once daily.  100 each  12  . simvastatin (ZOCOR) 40 MG tablet Take 1 tablet (40 mg total) by mouth at bedtime.  30 tablet  3   No current facility-administered medications on file prior to visit.   No Known Allergies Family History  Problem Relation Age of Onset  . Diabetes Mother  and on mother's side of family  . Colon cancer Mother   . Stroke Mother   . Diabetes Father     and on father's side of family  . Diabetes Sister   . Diabetes Brother   . Diabetes Sister     ROS: Constitutional: no weight gain/loss, no fatigue, no subjective hyperthermia/hypothermia Eyes: no blurry vision, no xerophthalmia ENT: no sore throat, no nodules palpated in throat, no dysphagia/odynophagia, no hoarseness Cardiovascular: no CP/SOB/palpitations/leg swelling Respiratory: no cough/SOB Gastrointestinal: no N/V/D/C Musculoskeletal: no muscle/joint aches Skin: no rashes Neurological: no tremors/numbness/tingling/dizziness Psychiatric: no depression/anxiety  PE: BP 124/78  Pulse 82  Temp(Src) 98.1 F (36.7 C) (Oral)  Resp 12  Ht 5\' 4"  (1.626 m)  Wt 111 lb 9.6 oz (50.621 kg)  BMI 19.15 kg/m2  SpO2 97% Wt Readings  from Last 3 Encounters:  12/20/13 111 lb 9.6 oz (50.621 kg)  11/16/13 112 lb 6.4 oz (50.984 kg)  10/23/13 111 lb 5 oz (50.491 kg)   Constitutional: thin, in NAD Eyes: PERRLA, EOMI, no exophthalmos ENT: moist mucous membranes, no thyromegaly - but full R thyroid lobe, no cervical lymphadenopathy Cardiovascular: RRR, No MRG Respiratory: CTA B Gastrointestinal: abdomen soft, NT, ND, BS+ Musculoskeletal: no deformities, strength intact in all 4 Skin: moist, warm, no rashes Neurological: no tremor with outstretched hands, DTR normal in all 4  ASSESSMENT: 1. DM2, non-insulin-dependent, uncontrolled, without complications - new dx  2. H/o Hyperthyroidism - 20 years ago - was on meds, cannot remember name  PLAN:  1. Patient with recently dx'ed diabetes, on oral antidiabetic regimen, with unknown control, since not checking sugars - We discussed about options for treatment, and I suggested to:  Patient Instructions  Please check sugars 2x a day as advised. Continue Metformin 850 mg 2x a daily. Please stop at the lab. - Strongly advised her to start checking sugars at different times of the day - check 2 times a day, rotating checks - given sugar log and advised how to fill it and to bring it at next appt  - given foot care handout and explained the principles  - given instructions for hypoglycemia management "15-15 rule"  - advised for yearly eye exams - she is up to date - discussed healthy eating, not skipping meals - Return to clinic in 1 mo with sugar log   2. H/o hyperthyroidism - appears euthyroid, but she is very thin - check TFTs today - may need a thyroid U/S to investigate the R thyroid fullness  Office Visit on 12/20/2013  Component Date Value Ref Range Status  . HM Diabetic Eye Exam 12/06/2013 No Retinopathy  No Retinopathy Final   Dr Ricki Miller  . TSH 12/20/2013 0.28* 0.35 - 5.50 uIU/mL Final  . Free T4 12/20/2013 0.97  0.60 - 1.60 ng/dL Final  . T3, Free  12/20/2013 2.9  2.3 - 4.2 pg/mL Final   Mild subclinical hyperthyroidism. Will need an uptake and scan especially to investigate the R thyroid fulness. I will d/w her at next visit, and will need a repeat TFT set then.

## 2013-12-21 DIAGNOSIS — Z8639 Personal history of other endocrine, nutritional and metabolic disease: Secondary | ICD-10-CM | POA: Insufficient documentation

## 2014-01-25 ENCOUNTER — Ambulatory Visit: Payer: Medicare Other

## 2014-02-01 ENCOUNTER — Ambulatory Visit: Payer: Medicare Other

## 2014-02-08 ENCOUNTER — Ambulatory Visit: Payer: Medicare Other

## 2014-02-16 ENCOUNTER — Telehealth: Payer: Self-pay

## 2014-02-16 NOTE — Telephone Encounter (Signed)
Appointment confirmed

## 2014-02-17 ENCOUNTER — Ambulatory Visit (INDEPENDENT_AMBULATORY_CARE_PROVIDER_SITE_OTHER): Payer: Medicare Other | Admitting: Family Medicine

## 2014-02-17 ENCOUNTER — Encounter: Payer: Self-pay | Admitting: Family Medicine

## 2014-02-17 VITALS — BP 120/76 | HR 76 | Temp 98.2°F | Ht 64.0 in | Wt 111.2 lb

## 2014-02-17 DIAGNOSIS — R634 Abnormal weight loss: Secondary | ICD-10-CM

## 2014-02-17 DIAGNOSIS — Z23 Encounter for immunization: Secondary | ICD-10-CM

## 2014-02-17 DIAGNOSIS — Z0000 Encounter for general adult medical examination without abnormal findings: Secondary | ICD-10-CM

## 2014-02-17 DIAGNOSIS — Z1231 Encounter for screening mammogram for malignant neoplasm of breast: Secondary | ICD-10-CM

## 2014-02-17 DIAGNOSIS — E2839 Other primary ovarian failure: Secondary | ICD-10-CM

## 2014-02-17 DIAGNOSIS — E1159 Type 2 diabetes mellitus with other circulatory complications: Secondary | ICD-10-CM

## 2014-02-17 LAB — HEMOGLOBIN A1C: Hgb A1c MFr Bld: 8.1 % — ABNORMAL HIGH (ref 4.6–6.5)

## 2014-02-17 LAB — CBC WITH DIFFERENTIAL/PLATELET
BASOS ABS: 0 10*3/uL (ref 0.0–0.1)
Basophils Relative: 0.4 % (ref 0.0–3.0)
EOS ABS: 0 10*3/uL (ref 0.0–0.7)
Eosinophils Relative: 0.6 % (ref 0.0–5.0)
HEMATOCRIT: 44.4 % (ref 36.0–46.0)
HEMOGLOBIN: 14.8 g/dL (ref 12.0–15.0)
LYMPHS ABS: 2 10*3/uL (ref 0.7–4.0)
Lymphocytes Relative: 27.6 % (ref 12.0–46.0)
MCHC: 33.3 g/dL (ref 30.0–36.0)
MCV: 91.1 fl (ref 78.0–100.0)
MONO ABS: 0.4 10*3/uL (ref 0.1–1.0)
Monocytes Relative: 5.3 % (ref 3.0–12.0)
NEUTROS ABS: 4.9 10*3/uL (ref 1.4–7.7)
Neutrophils Relative %: 66.1 % (ref 43.0–77.0)
Platelets: 497 10*3/uL — ABNORMAL HIGH (ref 150.0–400.0)
RBC: 4.88 Mil/uL (ref 3.87–5.11)
RDW: 17.7 % — AB (ref 11.5–15.5)
WBC: 7.4 10*3/uL (ref 4.0–10.5)

## 2014-02-17 LAB — BASIC METABOLIC PANEL
BUN: 18 mg/dL (ref 6–23)
CO2: 26 mEq/L (ref 19–32)
Calcium: 9.4 mg/dL (ref 8.4–10.5)
Chloride: 105 mEq/L (ref 96–112)
Creatinine, Ser: 0.7 mg/dL (ref 0.4–1.2)
GFR: 95.13 mL/min (ref >60.00)
Glucose, Bld: 171 mg/dL — ABNORMAL HIGH (ref 70–99)
POTASSIUM: 3.7 meq/L (ref 3.5–5.1)
SODIUM: 138 meq/L (ref 135–145)

## 2014-02-17 LAB — T4, FREE: Free T4: 1 ng/dL (ref 0.60–1.60)

## 2014-02-17 LAB — HEPATIC FUNCTION PANEL
ALBUMIN: 4 g/dL (ref 3.5–5.2)
ALT: 18 U/L (ref 0–35)
AST: 17 U/L (ref 0–37)
Alkaline Phosphatase: 52 U/L (ref 39–117)
Bilirubin, Direct: 0 mg/dL (ref 0.0–0.3)
TOTAL PROTEIN: 7.2 g/dL (ref 6.0–8.3)
Total Bilirubin: 0.5 mg/dL (ref 0.2–1.2)

## 2014-02-17 LAB — LIPID PANEL
Cholesterol: 205 mg/dL — ABNORMAL HIGH (ref 0–200)
HDL: 61.7 mg/dL (ref >39.00)
LDL Cholesterol: 125 mg/dL — ABNORMAL HIGH (ref 0–99)
NonHDL: 143.3
Total CHOL/HDL Ratio: 3
Triglycerides: 90 mg/dL (ref 0.0–149.0)
VLDL: 18 mg/dL (ref 0.0–40.0)

## 2014-02-17 LAB — T3, FREE: T3, Free: 2.6 pg/mL (ref 2.3–4.2)

## 2014-02-17 LAB — TSH: TSH: 0.44 u[IU]/mL (ref 0.35–4.50)

## 2014-02-17 MED ORDER — ZOSTER VACCINE LIVE 19400 UNT/0.65ML ~~LOC~~ SOLR: 0.6500 mL | Freq: Once | SUBCUTANEOUS | Status: DC

## 2014-02-17 NOTE — Patient Instructions (Signed)
Preventive Care for Adults A healthy lifestyle and preventive care can promote health and wellness. Preventive health guidelines for women include the following key practices.  A routine yearly physical is a good way to check with your health care Brandi Mccormick about your health and preventive screening. It is a chance to share any concerns and updates on your health and to receive a thorough exam.  Visit your dentist for a routine exam and preventive care every 6 months. Brush your teeth twice a day and floss once a day. Good oral hygiene prevents tooth decay and gum disease.  The frequency of eye exams is based on your age, health, family medical history, use of contact lenses, and other factors. Follow your health care Brandi Mccormick's recommendations for frequency of eye exams.  Eat a healthy diet. Foods like vegetables, fruits, whole grains, low-fat dairy products, and lean protein foods contain the nutrients you need without too many calories. Decrease your intake of foods high in solid fats, added sugars, and salt. Eat the right amount of calories for you.Get information about a proper diet from your health care Brandi Mccormick, if necessary.  Regular physical exercise is one of the most important things you can do for your health. Most adults should get at least 150 minutes of moderate-intensity exercise (any activity that increases your heart rate and causes you to sweat) each week. In addition, most adults need muscle-strengthening exercises on 2 or more days a week.  Maintain a healthy weight. The body mass index (BMI) is a screening tool to identify possible weight problems. It provides an estimate of body fat based on height and weight. Your health care Brandi Mccormick can find your BMI, and can help you achieve or maintain a healthy weight.For adults 20 years and older:  A BMI below 18.5 is considered underweight.  A BMI of 18.5 to 24.9 is normal.  A BMI of 25 to 29.9 is considered overweight.  A BMI of  30 and above is considered obese.  Maintain normal blood lipids and cholesterol levels by exercising and minimizing your intake of saturated fat. Eat a balanced diet with plenty of fruit and vegetables. Blood tests for lipids and cholesterol should begin at age 52 and be repeated every 5 years. If your lipid or cholesterol levels are high, you are over 50, or you are at high risk for heart disease, you may need your cholesterol levels checked more frequently.Ongoing high lipid and cholesterol levels should be treated with medicines if diet and exercise are not working.  If you smoke, find out from your health care Brandi Mccormick how to quit. If you do not use tobacco, do not start.  Lung cancer screening is recommended for adults aged 37-80 years who are at high risk for developing lung cancer because of a history of smoking. A yearly low-dose CT scan of the lungs is recommended for people who have at least a 30-pack-year history of smoking and are a current smoker or have quit within the past 15 years. A pack year of smoking is smoking an average of 1 pack of cigarettes a day for 1 year (for example: 1 pack a day for 30 years or 2 packs a day for 15 years). Yearly screening should continue until the smoker has stopped smoking for at least 15 years. Yearly screening should be stopped for people who develop a health problem that would prevent them from having lung cancer treatment.  If you are pregnant, do not drink alcohol. If you are breastfeeding,  be very cautious about drinking alcohol. If you are not pregnant and choose to drink alcohol, do not have more than 1 drink per day. One drink is considered to be 12 ounces (355 mL) of beer, 5 ounces (148 mL) of wine, or 1.5 ounces (44 mL) of liquor.  Avoid use of street drugs. Do not share needles with anyone. Ask for help if you need support or instructions about stopping the use of drugs.  High blood pressure causes heart disease and increases the risk of  stroke. Your blood pressure should be checked at least every 1 to 2 years. Ongoing high blood pressure should be treated with medicines if weight loss and exercise do not work.  If you are 75-52 years old, ask your health care Brandi Mccormick if you should take aspirin to prevent strokes.  Diabetes screening involves taking a blood sample to check your fasting blood sugar level. This should be done once every 3 years, after age 15, if you are within normal weight and without risk factors for diabetes. Testing should be considered at a younger age or be carried out more frequently if you are overweight and have at least 1 risk factor for diabetes.  Breast cancer screening is essential preventive care for women. You should practice "breast self-awareness." This means understanding the normal appearance and feel of your breasts and may include breast self-examination. Any changes detected, no matter how small, should be reported to a health care Brandi Mccormick. Women in their 58s and 30s should have a clinical breast exam (CBE) by a health care Brandi Mccormick as part of a regular health exam every 1 to 3 years. After age 16, women should have a CBE every year. Starting at age 53, women should consider having a mammogram (breast X-ray test) every year. Women who have a family history of breast cancer should talk to their health care Brandi Mccormick about genetic screening. Women at a high risk of breast cancer should talk to their health care providers about having an MRI and a mammogram every year.  Breast cancer gene (BRCA)-related cancer risk assessment is recommended for women who have family members with BRCA-related cancers. BRCA-related cancers include breast, ovarian, tubal, and peritoneal cancers. Having family members with these cancers may be associated with an increased risk for harmful changes (mutations) in the breast cancer genes BRCA1 and BRCA2. Results of the assessment will determine the need for genetic counseling and  BRCA1 and BRCA2 testing.  Routine pelvic exams to screen for cancer are no longer recommended for nonpregnant women who are considered low risk for cancer of the pelvic organs (ovaries, uterus, and vagina) and who do not have symptoms. Ask your health care Brandi Mccormick if a screening pelvic exam is right for you.  If you have had past treatment for cervical cancer or a condition that could lead to cancer, you need Pap tests and screening for cancer for at least 20 years after your treatment. If Pap tests have been discontinued, your risk factors (such as having a new sexual partner) need to be reassessed to determine if screening should be resumed. Some women have medical problems that increase the chance of getting cervical cancer. In these cases, your health care Brandi Mccormick may recommend more frequent screening and Pap tests.  The HPV test is an additional test that may be used for cervical cancer screening. The HPV test looks for the virus that can cause the cell changes on the cervix. The cells collected during the Pap test can be  tested for HPV. The HPV test could be used to screen women aged 47 years and older, and should be used in women of any age who have unclear Pap test results. After the age of 36, women should have HPV testing at the same frequency as a Pap test.  Colorectal cancer can be detected and often prevented. Most routine colorectal cancer screening begins at the age of 38 years and continues through age 58 years. However, your health care Brandi Mccormick may recommend screening at an earlier age if you have risk factors for colon cancer. On a yearly basis, your health care Brandi Mccormick may provide home test kits to check for hidden blood in the stool. Use of a small camera at the end of a tube, to directly examine the colon (sigmoidoscopy or colonoscopy), can detect the earliest forms of colorectal cancer. Talk to your health care Brandi Mccormick about this at age 64, when routine screening begins. Direct  exam of the colon should be repeated every 5-10 years through age 21 years, unless early forms of pre-cancerous polyps or small growths are found.  People who are at an increased risk for hepatitis B should be screened for this virus. You are considered at high risk for hepatitis B if:  You were born in a country where hepatitis B occurs often. Talk with your health care Brandi Mccormick about which countries are considered high risk.  Your parents were born in a high-risk country and you have not received a shot to protect against hepatitis B (hepatitis B vaccine).  You have HIV or AIDS.  You use needles to inject street drugs.  You live with, or have sex with, someone who has Hepatitis B.  You get hemodialysis treatment.  You take certain medicines for conditions like cancer, organ transplantation, and autoimmune conditions.  Hepatitis C blood testing is recommended for all people born from [birth year] through 1965 and any individual with known risks for hepatitis C.  Practice safe sex. Use condoms and avoid high-risk sexual practices to reduce the spread of sexually transmitted infections (STIs). STIs include gonorrhea, chlamydia, syphilis, trichomonas, herpes, HPV, and human immunodeficiency virus (HIV). Herpes, HIV, and HPV are viral illnesses that have no cure. They can result in disability, cancer, and death.  You should be screened for sexually transmitted illnesses (STIs) including gonorrhea and chlamydia if:  You are sexually active and are younger than 24 years.  You are older than 24 years and your health care Brandi Mccormick tells you that you are at risk for this type of infection.  Your sexual activity has changed since you were last screened and you are at an increased risk for chlamydia or gonorrhea. Ask your health care Brandi Mccormick if you are at risk.  If you are at risk of being infected with HIV, it is recommended that you take a prescription medicine daily to prevent HIV infection. This is  called preexposure prophylaxis (PrEP). You are considered at risk if:  You are a heterosexual woman, are sexually active, and are at increased risk for HIV infection.  You take drugs by injection.  You are sexually active with a partner who has HIV.  Talk with your health care Brandi Mccormick about whether you are at high risk of being infected with HIV. If you choose to begin PrEP, you should first be tested for HIV. You should then be tested every 3 months for as long as you are taking PrEP.  Osteoporosis is a disease in which the bones lose minerals and strength  with aging. This can result in serious bone fractures or breaks. The risk of osteoporosis can be identified using a bone density scan. Women ages 65 years and over and women at risk for fractures or osteoporosis should discuss screening with their health care providers. Ask your health care Barbarajean Kinzler whether you should take a calcium supplement or vitamin D to reduce the rate of osteoporosis.  Menopause can be associated with physical symptoms and risks. Hormone replacement therapy is available to decrease symptoms and risks. You should talk to your health care Lucylle Foulkes about whether hormone replacement therapy is right for you.  Use sunscreen. Apply sunscreen liberally and repeatedly throughout the day. You should seek shade when your shadow is shorter than you. Protect yourself by wearing long sleeves, pants, a wide-brimmed hat, and sunglasses year round, whenever you are outdoors.  Once a month, do a whole body skin exam, using a mirror to look at the skin on your back. Tell your health care Cephus Tupy of new moles, moles that have irregular borders, moles that are larger than a pencil eraser, or moles that have changed in shape or color.  Stay current with required vaccines (immunizations).  Influenza vaccine. All adults should be immunized every year.  Tetanus, diphtheria, and acellular pertussis (Td, Tdap) vaccine. Pregnant women should  receive 1 dose of Tdap vaccine during each pregnancy. The dose should be obtained regardless of the length of time since the last dose. Immunization is preferred during the 27th-36th week of gestation. An adult who has not previously received Tdap or who does not know her vaccine status should receive 1 dose of Tdap. This initial dose should be followed by tetanus and diphtheria toxoids (Td) booster doses every 10 years. Adults with an unknown or incomplete history of completing a 3-dose immunization series with Td-containing vaccines should begin or complete a primary immunization series including a Tdap dose. Adults should receive a Td booster every 10 years.  Varicella vaccine. An adult without evidence of immunity to varicella should receive 2 doses or a second dose if she has previously received 1 dose. Pregnant females who do not have evidence of immunity should receive the first dose after pregnancy. This first dose should be obtained before leaving the health care facility. The second dose should be obtained 4-8 weeks after the first dose.  Human papillomavirus (HPV) vaccine. Females aged 13-26 years who have not received the vaccine previously should obtain the 3-dose series. The vaccine is not recommended for use in pregnant females. However, pregnancy testing is not needed before receiving a dose. If a female is found to be pregnant after receiving a dose, no treatment is needed. In that case, the remaining doses should be delayed until after the pregnancy. Immunization is recommended for any person with an immunocompromised condition through the age of 26 years if she did not get any or all doses earlier. During the 3-dose series, the second dose should be obtained 4-8 weeks after the first dose. The third dose should be obtained 24 weeks after the first dose and 16 weeks after the second dose.  Zoster vaccine. One dose is recommended for adults aged 60 years or older unless certain conditions are  present.  Measles, mumps, and rubella (MMR) vaccine. Adults born before 1957 generally are considered immune to measles and mumps. Adults born in 1957 or later should have 1 or more doses of MMR vaccine unless there is a contraindication to the vaccine or there is laboratory evidence of immunity to   each of the three diseases. A routine second dose of MMR vaccine should be obtained at least 28 days after the first dose for students attending postsecondary schools, health care workers, or international travelers. People who received inactivated measles vaccine or an unknown type of measles vaccine during 1963-1967 should receive 2 doses of MMR vaccine. People who received inactivated mumps vaccine or an unknown type of mumps vaccine before 1979 and are at high risk for mumps infection should consider immunization with 2 doses of MMR vaccine. For females of childbearing age, rubella immunity should be determined. If there is no evidence of immunity, females who are not pregnant should be vaccinated. If there is no evidence of immunity, females who are pregnant should delay immunization until after pregnancy. Unvaccinated health care workers born before 1957 who lack laboratory evidence of measles, mumps, or rubella immunity or laboratory confirmation of disease should consider measles and mumps immunization with 2 doses of MMR vaccine or rubella immunization with 1 dose of MMR vaccine.  Pneumococcal 13-valent conjugate (PCV13) vaccine. When indicated, a person who is uncertain of her immunization history and has no record of immunization should receive the PCV13 vaccine. An adult aged 19 years or older who has certain medical conditions and has not been previously immunized should receive 1 dose of PCV13 vaccine. This PCV13 should be followed with a dose of pneumococcal polysaccharide (PPSV23) vaccine. The PPSV23 vaccine dose should be obtained at least 8 weeks after the dose of PCV13 vaccine. An adult aged 19  years or older who has certain medical conditions and previously received 1 or more doses of PPSV23 vaccine should receive 1 dose of PCV13. The PCV13 vaccine dose should be obtained 1 or more years after the last PPSV23 vaccine dose.  Pneumococcal polysaccharide (PPSV23) vaccine. When PCV13 is also indicated, PCV13 should be obtained first. All adults aged 65 years and older should be immunized. An adult younger than age 65 years who has certain medical conditions should be immunized. Any person who resides in a nursing home or long-term care facility should be immunized. An adult smoker should be immunized. People with an immunocompromised condition and certain other conditions should receive both PCV13 and PPSV23 vaccines. People with human immunodeficiency virus (HIV) infection should be immunized as soon as possible after diagnosis. Immunization during chemotherapy or radiation therapy should be avoided. Routine use of PPSV23 vaccine is not recommended for American Indians, [place] Natives, or people younger than 65 years unless there are medical conditions that require PPSV23 vaccine. When indicated, people who have unknown immunization and have no record of immunization should receive PPSV23 vaccine. One-time revaccination 5 years after the first dose of PPSV23 is recommended for people aged 19-64 years who have chronic kidney failure, nephrotic syndrome, asplenia, or immunocompromised conditions. People who received 1-2 doses of PPSV23 before age 65 years should receive another dose of PPSV23 vaccine at age 65 years or later if at least 5 years have passed since the previous dose. Doses of PPSV23 are not needed for people immunized with PPSV23 at or after age 65 years.  Meningococcal vaccine. Adults with asplenia or persistent complement component deficiencies should receive 2 doses of quadrivalent meningococcal conjugate (MenACWY-D) vaccine. The doses should be obtained at least 2 months apart.  Microbiologists working with certain meningococcal bacteria, military recruits, people at risk during an outbreak, and people who travel to or live in countries with a high rate of meningitis should be immunized. A first-year college student up through age   21 years who is living in a residence hall should receive a dose if she did not receive a dose on or after her 16th birthday. Adults who have certain high-risk conditions should receive one or more doses of vaccine.  Hepatitis A vaccine. Adults who wish to be protected from this disease, have certain high-risk conditions, work with hepatitis A-infected animals, work in hepatitis A research labs, or travel to or work in countries with a high rate of hepatitis A should be immunized. Adults who were previously unvaccinated and who anticipate close contact with an international adoptee during the first 60 days after arrival in the [place] from a country with a high rate of hepatitis A should be immunized.  Hepatitis B vaccine. Adults who wish to be protected from this disease, have certain high-risk conditions, may be exposed to blood or other infectious body fluids, are household contacts or sex partners of hepatitis B positive people, are clients or workers in certain care facilities, or travel to or work in countries with a high rate of hepatitis B should be immunized.  Haemophilus influenzae type b (Hib) vaccine. A previously unvaccinated person with asplenia or sickle cell disease or having a scheduled splenectomy should receive 1 dose of Hib vaccine. Regardless of previous immunization, a recipient of a hematopoietic stem cell transplant should receive a 3-dose series 6-12 months after her successful transplant. Hib vaccine is not recommended for adults with HIV infection. Preventive Services / Frequency Ages 43 to 39years  Blood pressure check.** / Every 1 to 2 years.  Lipid and cholesterol check.** / Every 5 years beginning at age  75.  Clinical breast exam.** / Every 3 years for women in their 32s and 74s.  BRCA-related cancer risk assessment.** / For women who have family members with a BRCA-related cancer (breast, ovarian, tubal, or peritoneal cancers).  Pap test.** / Every 2 years from ages 65 through 91. Every 3 years starting at age 34 through age 93 or 72 with a history of 3 consecutive normal Pap tests.  HPV screening.** / Every 3 years from ages 46 through ages 53 to 26 with a history of 3 consecutive normal Pap tests.  Hepatitis C blood test.** / For any individual with known risks for hepatitis C.  Skin self-exam. / Monthly.  Influenza vaccine. / Every year.  Tetanus, diphtheria, and acellular pertussis (Tdap, Td) vaccine.** / Consult your health care Hila Bolding. Pregnant women should receive 1 dose of Tdap vaccine during each pregnancy. 1 dose of Td every 10 years.  Varicella vaccine.** / Consult your health care Sameer Teeple. Pregnant females who do not have evidence of immunity should receive the first dose after pregnancy.  HPV vaccine. / 3 doses over 6 months, if 70 and younger. The vaccine is not recommended for use in pregnant females. However, pregnancy testing is not needed before receiving a dose.  Measles, mumps, rubella (MMR) vaccine.** / You need at least 1 dose of MMR if you were born in 1957 or later. You may also need a 2nd dose. For females of childbearing age, rubella immunity should be determined. If there is no evidence of immunity, females who are not pregnant should be vaccinated. If there is no evidence of immunity, females who are pregnant should delay immunization until after pregnancy.  Pneumococcal 13-valent conjugate (PCV13) vaccine.** / Consult your health care Mizuki Hoel.  Pneumococcal polysaccharide (PPSV23) vaccine.** / 1 to 2 doses if you smoke cigarettes or if you have certain conditions.  Meningococcal vaccine.** /  1 dose if you are age 70 to 51 years and a [place]er living in a residence hall, or have one of several medical conditions, you need to get vaccinated against meningococcal disease. You may also need additional booster doses.  Hepatitis A vaccine.** / Consult your health care Montasia Chisenhall.  Hepatitis B vaccine.** / Consult your health care Manpreet Strey.  Haemophilus influenzae type b (Hib) vaccine.** / Consult your health care Denesha Brouse. Ages 40 to 64years  Blood pressure check.** / Every 1 to 2 years.  Lipid and cholesterol check.** / Every 5 years beginning at age 58 years.  Lung cancer screening. / Every year if you are aged 56-80 years and have a 30-pack-year history of smoking and currently smoke or have quit within the past 15 years. Yearly screening is stopped once you have quit smoking for at least 15 years or develop a health problem that would prevent you from having lung cancer treatment.  Clinical breast exam.** / Every year after age 35 years.  BRCA-related cancer risk assessment.** / For women who have family members with a BRCA-related cancer (breast, ovarian, tubal, or peritoneal cancers).  Mammogram.** / Every year beginning at age 109 years and continuing for as long as you are in good health. Consult with your health care Trecia Maring.  Pap test.** / Every 3 years starting at age 44 years through age 94 or 70 years with a history of 3 consecutive normal Pap tests.  HPV screening.** / Every 3 years from ages 109 years through ages 50 to 30 years with a history of 3 consecutive normal Pap tests.  Fecal occult blood test (FOBT) of stool. / Every year beginning at age 73 years and continuing until age 59 years. You may not need to do this test if you get a colonoscopy every 10 years.  Flexible sigmoidoscopy or colonoscopy.** / Every 5 years for a flexible sigmoidoscopy or every 10 years for a colonoscopy beginning at age 68 years and continuing until age 12 years.  Hepatitis C blood test.** / For all people born from [birth year] through  1964 and any individual with known risks for hepatitis C.  Skin self-exam. / Monthly.  Influenza vaccine. / Every year.  Tetanus, diphtheria, and acellular pertussis (Tdap/Td) vaccine.** / Consult your health care Emma Schupp. Pregnant women should receive 1 dose of Tdap vaccine during each pregnancy. 1 dose of Td every 10 years.  Varicella vaccine.** / Consult your health care Adebayo Ensminger. Pregnant females who do not have evidence of immunity should receive the first dose after pregnancy.  Zoster vaccine.** / 1 dose for adults aged 2 years or older.  Measles, mumps, rubella (MMR) vaccine.** / You need at least 1 dose of MMR if you were born in 1956 or later. You may also need a 2nd dose. For females of childbearing age, rubella immunity should be determined. If there is no evidence of immunity, females who are not pregnant should be vaccinated. If there is no evidence of immunity, females who are pregnant should delay immunization until after pregnancy.  Pneumococcal 13-valent conjugate (PCV13) vaccine.** / Consult your health care Thai Hemrick.  Pneumococcal polysaccharide (PPSV23) vaccine.** / 1 to 2 doses if you smoke cigarettes or if you have certain conditions.  Meningococcal vaccine.** / Consult your health care Jessi Pitstick.  Hepatitis A vaccine.** / Consult your health care Leandra Vanderweele.  Hepatitis B vaccine.** / Consult your health care Zair Borawski.  Haemophilus influenzae type b (Hib) vaccine.** / Consult your health care Janace Decker. Ages 48 years  and over  Blood pressure check.** / Every 1 to 2 years.  Lipid and cholesterol check.** / Every 5 years beginning at age 84 years.  Lung cancer screening. / Every year if you are aged 50-80 years and have a 30-pack-year history of smoking and currently smoke or have quit within the past 15 years. Yearly screening is stopped once you have quit smoking for at least 15 years or develop a health problem that would prevent you from having lung cancer  treatment.  Clinical breast exam.** / Every year after age 24 years.  BRCA-related cancer risk assessment.** / For women who have family members with a BRCA-related cancer (breast, ovarian, tubal, or peritoneal cancers).  Mammogram.** / Every year beginning at age 14 years and continuing for as long as you are in good health. Consult with your health care Senaya Dicenso.  Pap test.** / Every 3 years starting at age 17 years through age 31 or 74 years with 3 consecutive normal Pap tests. Testing can be stopped between 65 and 70 years with 3 consecutive normal Pap tests and no abnormal Pap or HPV tests in the past 10 years.  HPV screening.** / Every 3 years from ages 30 years through ages 70 or 28 years with a history of 3 consecutive normal Pap tests. Testing can be stopped between 65 and 70 years with 3 consecutive normal Pap tests and no abnormal Pap or HPV tests in the past 10 years.  Fecal occult blood test (FOBT) of stool. / Every year beginning at age 64 years and continuing until age 92 years. You may not need to do this test if you get a colonoscopy every 10 years.  Flexible sigmoidoscopy or colonoscopy.** / Every 5 years for a flexible sigmoidoscopy or every 10 years for a colonoscopy beginning at age 73 years and continuing until age 39 years.  Hepatitis C blood test.** / For all people born from [birth year] through 1964 and any individual with known risks for hepatitis C.  Osteoporosis screening.** / A one-time screening for women ages 35 years and over and women at risk for fractures or osteoporosis.  Skin self-exam. / Monthly.  Influenza vaccine. / Every year.  Tetanus, diphtheria, and acellular pertussis (Tdap/Td) vaccine.** / 1 dose of Td every 10 years.  Varicella vaccine.** / Consult your health care Summerlynn Glauser.  Zoster vaccine.** / 1 dose for adults aged 59 years or older.  Pneumococcal 13-valent conjugate (PCV13) vaccine.** / Consult your health care Leonetta Mcgivern.  Pneumococcal  polysaccharide (PPSV23) vaccine.** / 1 dose for all adults aged 8 years and older.  Meningococcal vaccine.** / Consult your health care Leeana Creer.  Hepatitis A vaccine.** / Consult your health care Belissa Kooy.  Hepatitis B vaccine.** / Consult your health care Alfie Rideaux.  Haemophilus influenzae type b (Hib) vaccine.** / Consult your health care Rahima Fleishman. ** Family history and personal history of risk and conditions may change your health care Niyah Mamaril's recommendations. Document Released: 10/15/2001 Document Revised: 08/24/2013 Document Reviewed: 01/14/2011 [facility] Patient Information 2015 [place], [place]. This information is not intended to replace advice given to you by your health care Yuli Lanigan. Make sure you discuss any questions you have with your health care Cristalle Rohm.

## 2014-02-17 NOTE — Progress Notes (Signed)
Pre visit review using our clinic review tool, if applicable. No additional management support is needed unless otherwise documented below in the visit note. 

## 2014-02-17 NOTE — Progress Notes (Signed)
Subjective:    Brandi Mccormick is a 73 y.o. female who presents for Medicare Annual/Subsequent preventive examination.  Preventive Screening-Counseling & Management  Tobacco History  Smoking status  . Never Smoker   Smokeless tobacco  . Not on file     Problems Prior to Visit 1. none  Current Problems (verified) Patient Active Problem List   Diagnosis Date Noted  . H/O hyperthyroidism 12/21/2013  . Type II or unspecified type diabetes mellitus with unspecified complication, uncontrolled 11/16/2013    Medications Prior to Visit Current Outpatient Prescriptions on File Prior to Visit  Medication Sig Dispense Refill  . glucose blood test strip Check blood sugar once daily.  100 each  12  . Lancets (ONETOUCH ULTRASOFT) lancets Check blood sugar once daily  100 each  12  . metFORMIN (GLUCOPHAGE) 850 MG tablet Take 1 tablet (850 mg total) by mouth 2 (two) times daily with a meal.  60 tablet  2  . ONETOUCH DELICA LANCETS FINE MISC Check blood sugar once daily.  100 each  12  . simvastatin (ZOCOR) 40 MG tablet Take 1 tablet (40 mg total) by mouth at bedtime.  30 tablet  3   No current facility-administered medications on file prior to visit.    Current Medications (verified) Current Outpatient Prescriptions  Medication Sig Dispense Refill  . glucose blood test strip Check blood sugar once daily.  100 each  12  . Lancets (ONETOUCH ULTRASOFT) lancets Check blood sugar once daily  100 each  12  . metFORMIN (GLUCOPHAGE) 850 MG tablet Take 1 tablet (850 mg total) by mouth 2 (two) times daily with a meal.  60 tablet  2  . ONETOUCH DELICA LANCETS FINE MISC Check blood sugar once daily.  100 each  12  . simvastatin (ZOCOR) 40 MG tablet Take 1 tablet (40 mg total) by mouth at bedtime.  30 tablet  3   No current facility-administered medications for this visit.     Allergies (verified) Review of patient's allergies indicates no known allergies.   PAST HISTORY  Family  History Family History  Problem Relation Age of Onset  . Diabetes Mother     and on mother's side of family  . Colon cancer Mother   . Stroke Mother   . Diabetes Father     and on father's side of family  . Diabetes Sister   . Diabetes Brother   . Diabetes Sister     Social History History  Substance Use Topics  . Smoking status: Never Smoker   . Smokeless tobacco: Not on file  . Alcohol Use: No     Are there smokers in your home (other than you)? No  Risk Factors Current exercise habits: yard work an running with great grandchildren  Dietary issues discussed: to see nutrition   Cardiac risk factors: advanced age (older than 4 for men, 53 for women), diabetes mellitus and dyslipidemia.  Depression Screen (Note: if answer to either of the following is "Yes", a more complete depression screening is indicated)   Over the past two weeks, have you felt down, depressed or hopeless? No  Over the past two weeks, have you felt little interest or pleasure in doing things? No  Have you lost interest or pleasure in daily life? No  Do you often feel hopeless? No  Do you cry easily over simple problems? No  Activities of Daily Living In your present state of health, do you have any difficulty performing the following activities?:  Driving? No Managing money?  No Feeding yourself? No Getting from bed to chair? No Climbing a flight of stairs? No Preparing food and eating?: No Bathing or showering? No Getting dressed: No Getting to the toilet? No Using the toilet:No Moving around from place to place: No In the past year have you fallen or had a near fall?:No   Are you sexually active?  Yes  Do you have more than one partner?  No  Hearing Difficulties: No Do you often ask people to speak up or repeat themselves? No Do you experience ringing or noises in your ears? No Do you have difficulty understanding soft or whispered voices? No   Do you feel that you have a problem  with memory? No  Do you often misplace items? No  Do you feel safe at home?  Yes  Cognitive Testing  Alert? Yes  Normal Appearance?Yes  Oriented to person? Yes  Place? Yes   Time? Yes  Recall of three objects?  Yes  Can perform simple calculations? Yes  Displays appropriate judgment?Yes  Can read the correct time from a watch face?Yes   Advanced Directives have been discussed with the patient? Yes  List the Names of Other Physician/Practitioners you currently use: 1.  Eye- [place] 2. Dentist-- Ilda Basset 3 endo- [facility]   Indicate any recent Medical Services you may have received from other than [facility] providers in the past year (date may be approximate).  Immunization History  Administered Date(s) Administered  . Pneumococcal Polysaccharide-23 11/16/2013    Screening Tests Health Maintenance  Topic Date Due  . Tetanus/tdap  10/02/1960  . Mammogram  10/03/1991  . Colonoscopy  10/03/1991  . Zostavax  10/02/2001  . Influenza Vaccine  04/02/2014  . Hemoglobin A1c  05/19/2014  . Urine Microalbumin  11/17/2014  . Ophthalmology Exam  12/07/2014  . Foot Exam  02/18/2015  . Pneumococcal Polysaccharide Vaccine Age 35 And Over  Completed    All answers were reviewed with the patient and necessary referrals were made:  Garnet Koyanagi, DO   02/17/2014   History reviewed:  She  has a past medical history of Diabetes; Thyroid disease; and Shingles. She  does not have any pertinent problems on file. She  has past surgical history that includes Dental surgery. Her family history includes Colon cancer in her mother; Diabetes in her brother, father, mother, sister, and sister; Stroke in her mother. She  reports that she has never smoked. She does not have any smokeless tobacco history on file. She reports that she does not drink alcohol or use illicit drugs. She has a current medication list which includes the following prescription(s): glucose blood, onetouch ultrasoft, metformin,  onetouch delica lancets fine, and simvastatin. Current Outpatient Prescriptions on File Prior to Visit  Medication Sig Dispense Refill  . glucose blood test strip Check blood sugar once daily.  100 each  12  . Lancets (ONETOUCH ULTRASOFT) lancets Check blood sugar once daily  100 each  12  . metFORMIN (GLUCOPHAGE) 850 MG tablet Take 1 tablet (850 mg total) by mouth 2 (two) times daily with a meal.  60 tablet  2  . ONETOUCH DELICA LANCETS FINE MISC Check blood sugar once daily.  100 each  12  . simvastatin (ZOCOR) 40 MG tablet Take 1 tablet (40 mg total) by mouth at bedtime.  30 tablet  3   No current facility-administered medications on file prior to visit.   She has No Known Allergies.  Review of Systems  Review of Systems  Constitutional: Negative for activity change, appetite change and fatigue.  HENT: Negative for hearing loss, congestion, tinnitus and ear discharge.   Eyes: Negative for visual disturbance (see optho q1y -- vision corrected to 20/20 with glasses).  Respiratory: Negative for cough, chest tightness and shortness of breath.   Cardiovascular: Negative for chest pain, palpitations and leg swelling.  Gastrointestinal: Negative for abdominal pain, diarrhea, constipation and abdominal distention.  Genitourinary: Negative for urgency, frequency, decreased urine volume and difficulty urinating.  Musculoskeletal: Negative for back pain, arthralgias and gait problem.  Skin: Negative for color change, pallor and rash.  Neurological: Negative for dizziness, light-headedness, numbness and headaches.  Hematological: Negative for adenopathy. Does not bruise/bleed easily.  Psychiatric/Behavioral: Negative for suicidal ideas, confusion, sleep disturbance, self-injury, dysphoric mood, decreased concentration and agitation.  Pt is able to read and write and can do all ADLs No risk for falling No abuse/ violence in home      Objective:     Vision by Snellen chart: opth  Body  mass index is 19.08 kg/(m^2). BP 120/76  Pulse 76  Temp(Src) 98.2 F (36.8 C) (Oral)  Ht 5\' 4"  (1.626 m)  Wt 111 lb 3.2 oz (50.44 kg)  BMI 19.08 kg/m2  SpO2 96%  BP 120/76  Pulse 76  Temp(Src) 98.2 F (36.8 C) (Oral)  Ht 5\' 4"  (1.626 m)  Wt 111 lb 3.2 oz (50.44 kg)  BMI 19.08 kg/m2  SpO2 96% General appearance: alert, cooperative, appears stated age and no distress Head: Normocephalic, without obvious abnormality, atraumatic Eyes: conjunctivae/corneas clear. PERRL, EOM's intact. Fundi benign. Ears: normal TM's and external ear canals both ears Nose: Nares normal. Septum midline. Mucosa normal. No drainage or sinus tenderness. Throat: lips, mucosa, and tongue normal; teeth and gums normal Neck: no adenopathy, no carotid bruit, no JVD, supple, symmetrical, trachea midline and thyroid not enlarged, symmetric, no tenderness/mass/nodules Back: symmetric, no curvature. ROM normal. No CVA tenderness. Lungs: clear to auscultation bilaterally Breasts: normal appearance, no masses or tenderness Heart: regular rate and rhythm, S1, S2 normal, no murmur, click, rub or gallop Abdomen: soft, non-tender; bowel sounds normal; no masses,  no organomegaly Pelvic: deferred Extremities: extremities normal, atraumatic, no cyanosis or edema Pulses: 2+ and symmetric Skin: Skin color, texture, turgor normal. No rashes or lesions Lymph nodes: Cervical, supraclavicular, and axillary nodes normal. Neurologic: Alert and oriented X 3, normal strength and tone. Normal symmetric reflexes. Normal coordination and gait Psych-- no depression, no anxiety      Assessment:     cpe     Plan:     During the course of the visit the patient was educated and counseled about appropriate screening and preventive services including:    Screening mammography  Bone densitometry screening  Colorectal cancer screening  Diabetes screening  Glaucoma screening  Advanced directives: has NO advanced directive  - not interested in additional information  Diet review for nutrition referral? Yes ____  Not Indicated ___x_   Patient Instructions (the written plan) was given to the patient.  Medicare Attestation I have personally reviewed: The patient's medical and social history Their use of alcohol, tobacco or illicit drugs Their current medications and supplements The patient's functional ability including ADLs,fall risks, home safety risks, cognitive, and hearing and visual impairment Diet and physical activities Evidence for depression or mood disorders  The patient's weight, height, BMI, and visual acuity have been recorded in the chart.  I have made referrals, counseling, and provided education to the patient  based on review of the above and I have provided the patient with a written personalized care plan for preventive services.    1. Loss of weight  - TSH - T3, free - T4, free  2. Type II or unspecified type diabetes mellitus with peripheral circulatory disorders, uncontrolled(250.72) Check labs-- f/u endo - Basic metabolic panel - CBC with Differential - Hemoglobin A1c - Hepatic function panel - Lipid panel - Microalbumin / creatinine urine ratio - POCT urinalysis dipstick  3. Medicare annual wellness visit, subsequent    4. Preventative health care    5. Other screening mammogram   - MM DIGITAL SCREENING BILATERAL; Future  6. Estrogen deficiency   - DG Bone Density; Future  7. Need for shingles vaccine   - zoster vaccine live, PF, (ZOSTAVAX) 19147 UNT/0.65ML injection; Inject 19,400 Units into the skin once.  Dispense: 1 vial; Refill: [place], DO   02/17/2014

## 2014-02-18 ENCOUNTER — Encounter: Payer: Self-pay | Admitting: Internal Medicine

## 2014-02-18 ENCOUNTER — Ambulatory Visit (INDEPENDENT_AMBULATORY_CARE_PROVIDER_SITE_OTHER): Payer: Medicare Other | Admitting: Internal Medicine

## 2014-02-18 VITALS — BP 122/76 | HR 113 | Temp 98.2°F | Resp 12 | Wt 112.0 lb

## 2014-02-18 DIAGNOSIS — Z8639 Personal history of other endocrine, nutritional and metabolic disease: Secondary | ICD-10-CM

## 2014-02-18 DIAGNOSIS — IMO0002 Reserved for concepts with insufficient information to code with codable children: Secondary | ICD-10-CM

## 2014-02-18 DIAGNOSIS — E1165 Type 2 diabetes mellitus with hyperglycemia: Secondary | ICD-10-CM

## 2014-02-18 DIAGNOSIS — Z862 Personal history of diseases of the blood and blood-forming organs and certain disorders involving the immune mechanism: Secondary | ICD-10-CM

## 2014-02-18 DIAGNOSIS — E118 Type 2 diabetes mellitus with unspecified complications: Secondary | ICD-10-CM

## 2014-02-18 MED ORDER — GLIPIZIDE 5 MG PO TABS: 5.0000 mg | ORAL_TABLET | Freq: Two times a day (BID) | ORAL | Status: DC

## 2014-02-18 NOTE — Progress Notes (Signed)
Patient ID: Brandi Mccormick, female   DOB: 19-Sep-1940, 73 y.o.   MRN: [id number]  HPI: Brandi Mccormick is a 73 y.o.-year-old female, returning for f/u for DM2, dx 2013, non-insulin-dependent, uncontrolled, without complications and h/o hyperthyroidism.  She has a h/o hyperthyroidism, tx with meds ~20 years ago. No other history available.  No neck compression sxs.  Recent labs reviewed: Lab Results  Component Value Date   TSH 0.28* 12/20/2013   FREET4 0.97 12/20/2013  Labs were collected yesterday by PCP but results not back yet.  DM2:  Last hemoglobin A1c was: Lab Results  Component Value Date   HGBA1C 11.9* 11/16/2013  A new HbA1c was collected yesterday by PCP but results not back yet.  Pt is on a regimen of: - Metformin 850 mg po bid  Pt now checks her sugars - 2x a day and they are high: - am: n/c >> 170-230 - 2h after b'fast: n/c >> 144,160-280 - before lunch: n/c >> 178, 184 - 2h after lunch: n/c >>  226, 244, 354 - before dinner: n/c >> 161, 174, 182, 211 - 2h after dinner: n/c >> 174-343 - bedtime: n/c >> 182-280 - nighttime: n/c no lows; ? If she  she has hypoglycemia awareness. Highest sugar was 353.  Meter: OneTouch Verio  Pt's meals are: - Breakfast: egg, coffee, juice; sometime - Lunch: No Lunch - Dinner: meat + vegetables - Snacks: 1-2  - no CKD, last BUN/creatinine:  Lab Results  Component Value Date   BUN 19 11/16/2013   CREATININE 0.5 11/16/2013  ACR 8 on 11/16/2013. - last set of lipids: Lab Results  Component Value Date   CHOL 302* 11/16/2013   HDL 65.30 11/16/2013   LDLCALC 221* 11/16/2013   TRIG 78.0 11/16/2013   CHOLHDL 5 11/16/2013  on Simvastatin. - last eye exam was in 12/06/2013. No DR.  - no numbness and tingling in her feet.  She also has a history of HL.  ROS: Constitutional: no weight gain/loss, no fatigue, no subjective hyperthermia/hypothermia Eyes: no blurry vision, no xerophthalmia ENT: no sore throat, no nodules palpated in  throat, no dysphagia/odynophagia, no hoarseness Cardiovascular: no CP/SOB/palpitations/leg swelling Respiratory: no cough/SOB Gastrointestinal: no N/V/D/C Musculoskeletal: no muscle/joint aches Skin: no rashes Neurological: no tremors/numbness/tingling/dizziness  I reviewed pt's medications, allergies, PMH, social hx, family hx and no changes required, except as mentioned above.  PE: BP 122/76  Pulse 113  Temp(Src) 98.2 F (36.8 C) (Oral)  Resp 12  Wt 112 lb (50.803 kg)  SpO2 96% Wt Readings from Last 3 Encounters:  02/18/14 112 lb (50.803 kg)  02/17/14 111 lb 3.2 oz (50.44 kg)  12/20/13 111 lb 9.6 oz (50.621 kg)   Constitutional: thin, in NAD Eyes: PERRLA, EOMI, no exophthalmos ENT: moist mucous membranes, no thyromegaly - but full R thyroid lobe, no cervical lymphadenopathy Cardiovascular: tachycardia, RR, No MRG Respiratory: CTA B Gastrointestinal: abdomen soft, NT, ND, BS+ Musculoskeletal: no deformities, strength intact in all 4 Skin: moist, warm, no rashes Neurological: no tremor with outstretched hands, DTR normal in all 4  ASSESSMENT: 1. DM2, non-insulin-dependent, uncontrolled, without complications - new dx  2. H/o Hyperthyroidism - 20 years ago - was on meds, cannot remember name  PLAN:  1. Patient with recently dx'ed diabetes, on oral antidiabetic regimen, with suboptimal control. I advised her to start basal insulin but she would like to try other oral alternatives. - We discussed about options for treatment, and I suggested to:  Patient Instructions  Please  continue Metformin 850 mg 2x a day. Start Glipizide 5 mg 2x a day before breakfast and before dinner. Please return in 1 month with your sugar log.  - if sugars improved at last visit, may be able to add Januvia and avoid insulin - continue checking sugars at different times of the day - check 2 times a day, rotating checks - given more sugar logs  - she is up to date with yearly eye exams - A1c  from yesterday pending - Return to clinic in 1 mo with sugar log   2. H/o hyperthyroidism - appears euthyroid, but she is very thin - her pulse is high and she mentions this is because she is nervous when she comes to the dr - will await TFT results (checked yesterday) - may need a thyroid U/S to investigate the R thyroid fullness  Component     Latest Ref Rng 02/17/2014  Hemoglobin A1C     4.6 - 6.5 % 8.1 (H)  TSH     0.35 - 4.50 uIU/mL 0.44  T3, Free     2.3 - 4.2 pg/mL 2.6  Free T4     0.60 - 1.60 ng/dL 1.00   TFTs normal now. HbA1c greatly decreased!

## 2014-02-18 NOTE — Patient Instructions (Signed)
Please continue Metformin 850 mg 2x a day. Start Glipizide 5 mg 2x a day before breakfast and before dinner.  Please return in 1 month with your sugar log.

## 2014-02-21 LAB — MICROALBUMIN / CREATININE URINE RATIO
CREATININE, U: 169 mg/dL
MICROALB UR: 16.7 mg/dL — AB (ref 0.0–1.9)
MICROALB/CREAT RATIO: 9.9 mg/g (ref 0.0–30.0)

## 2014-02-21 NOTE — Addendum Note (Signed)
Addended by: Modena Morrow D on: 02/21/2014 02:29 PM   Modules accepted: Orders

## 2014-02-23 ENCOUNTER — Telehealth: Payer: Self-pay

## 2014-02-23 DIAGNOSIS — E785 Hyperlipidemia, unspecified: Secondary | ICD-10-CM

## 2014-02-23 MED ORDER — ATORVASTATIN CALCIUM 20 MG PO TABS: 20.0000 mg | ORAL_TABLET | Freq: Every day | ORAL | Status: DC

## 2014-02-23 NOTE — Telephone Encounter (Signed)
Pt called stating the Glipizide has cause terrible stomach issues. Some of the symptoms have been nausea and stomach pain. Pt wanted to know if an alternative could be given.

## 2014-02-23 NOTE — Telephone Encounter (Signed)
hgba1c-- better-- I know you have an appointment with endo soon Your microalbumin went up-- need to check a 24 h urine for protein Cholesterol--- LDL goal < 70, HDL >40, TG < 150. Diet and exercise will increase HDL and decrease LDL and TG. Fish, Fish Oil, Flaxseed oil will also help increase the HDL and decrease Triglycerides. Recheck labs in 3 months----- zocor is not strong enough. Change to Lipitor 20 mg #30 1 po qhs, 2 refills 272.4 250.00 Lipid, hep, bmp, hgba1c.   Spoke with patient and she voiced understanding, she has agreed to switch to the Lipitor and will stop by the office in the morning for the 24 hour urine container. Med's updated.    KP

## 2014-02-23 NOTE — Telephone Encounter (Signed)
Please read note below and advise.  

## 2014-02-23 NOTE — Telephone Encounter (Signed)
Called pt and advised her per Dr Arman Filter note. Pt understood and will call to let us know if the pain comes back.

## 2014-02-23 NOTE — Telephone Encounter (Signed)
Let's stop Glipizide for 3-4 days to see if pain better. Can then restart 5 mg in am before b'fast and advance as tolerated. If pain is back >> will need a new med.

## 2014-03-01 LAB — POCT URINALYSIS DIPSTICK
Bilirubin, UA: NEGATIVE
Blood, UA: NEGATIVE
Glucose, UA: NEGATIVE
Ketones, UA: NEGATIVE
Leukocytes, UA: NEGATIVE
Nitrite, UA: NEGATIVE
PH UA: 6
Protein, UA: 30
SPEC GRAV UA: 1.025
UROBILINOGEN UA: 0.2

## 2014-03-08 ENCOUNTER — Ambulatory Visit
Admission: RE | Admit: 2014-03-08 | Discharge: 2014-03-08 | Disposition: A | Discharge status: Home / Self Care | Payer: Medicare Other | Source: Ambulatory Visit | Attending: Family Medicine | Admitting: Family Medicine

## 2014-03-08 DIAGNOSIS — Z1231 Encounter for screening mammogram for malignant neoplasm of breast: Secondary | ICD-10-CM

## 2014-03-21 ENCOUNTER — Ambulatory Visit (INDEPENDENT_AMBULATORY_CARE_PROVIDER_SITE_OTHER): Payer: Medicare Other | Admitting: Internal Medicine

## 2014-03-21 ENCOUNTER — Encounter: Payer: Self-pay | Admitting: Internal Medicine

## 2014-03-21 VITALS — BP 176/92 | HR 76 | Temp 98.5°F | Resp 12 | Wt 116.6 lb

## 2014-03-21 DIAGNOSIS — E1165 Type 2 diabetes mellitus with hyperglycemia: Secondary | ICD-10-CM

## 2014-03-21 DIAGNOSIS — Z8639 Personal history of other endocrine, nutritional and metabolic disease: Secondary | ICD-10-CM

## 2014-03-21 DIAGNOSIS — E118 Type 2 diabetes mellitus with unspecified complications: Secondary | ICD-10-CM

## 2014-03-21 DIAGNOSIS — Z862 Personal history of diseases of the blood and blood-forming organs and certain disorders involving the immune mechanism: Secondary | ICD-10-CM

## 2014-03-21 DIAGNOSIS — IMO0002 Reserved for concepts with insufficient information to code with codable children: Secondary | ICD-10-CM

## 2014-03-21 MED ORDER — ONETOUCH DELICA LANCETS FINE MISC: Status: DC

## 2014-03-21 NOTE — Progress Notes (Signed)
Patient ID: Brandi Mccormick, female   DOB: [birth year], 73 y.o.   MRN: [id number]  HPI: Brandi Mccormick is a 73 y.o.-year-old female, returning for f/u for DM2, dx 2013, non-insulin-dependent, uncontrolled, without complications and h/o hyperthyroidism. Last visit 1 mo ago.   She has a h/o hyperthyroidism, tx with meds ~20 years ago. No other history available.   Recent labs reviewed - normal: Lab Results  Component Value Date   TSH 0.44 02/17/2014   TSH 0.28* 12/20/2013   FREET4 1.00 02/17/2014   FREET4 0.97 12/20/2013   DM2:  Last hemoglobin A1c was: Lab Results  Component Value Date   HGBA1C 8.1* 02/17/2014   HGBA1C 11.9* 11/16/2013   Pt is on a regimen of: - Metformin 850 mg po bid - Glipizide 5 mg bid - initially had AP with this, not resolved  Pt now checks her sugars - 2x a day and they are better: - am: n/c >> 170-230 >> 95-194 - 2h after b'fast: n/c >> 144,160-280 >> 151, 157, 170 - before lunch: n/c >> 178, 184 >> 187 - 2h after lunch: n/c >>  226, 244, 354 >> 105, 173 - before dinner: n/c >> 161, 174, 182, 211 >> n/c - 2h after dinner: n/c >> 174-343 >> 153, 237, 240, 289 - bedtime: n/c >> 182-280 >> 86, 111, 142, 195 - nighttime: n/c no lows; ? If she  she has hypoglycemia awareness. Highest sugar was  289 x1.  Meter: OneTouch Verio.  Pt's meals are: - Breakfast: egg, coffee, juice; sometime - Lunch: No Lunch - Dinner: meat + vegetables - Snacks: 1-2  - no CKD, last BUN/creatinine:  Lab Results  Component Value Date   BUN 18 02/17/2014   CREATININE 0.7 02/17/2014  ACR 8 on 11/16/2013. - last set of lipids: Lab Results  Component Value Date   CHOL 205* 02/17/2014   HDL 61.70 02/17/2014   LDLCALC 125* 02/17/2014   TRIG 90.0 02/17/2014   CHOLHDL 3 02/17/2014  on Simvastatin. - last eye exam was in 12/06/2013. No DR.  - no numbness and tingling in her feet.   ROS: Constitutional: no weight gain/loss, no fatigue, no subjective hyperthermia/hypothermia Eyes:  no blurry vision, no xerophthalmia ENT: no sore throat, no nodules palpated in throat, no dysphagia/odynophagia, no hoarseness Cardiovascular: no CP/SOB/palpitations/leg swelling Respiratory: no cough/SOB Gastrointestinal: no N/V/D/C Musculoskeletal: no muscle/joint aches Skin: no rashes Neurological: no tremors/numbness/tingling/dizziness  I reviewed pt's medications, allergies, PMH, social hx, family hx and no changes required, except as mentioned above.  PE: BP 176/92  Pulse 76  Temp(Src) 98.5 F (36.9 C) (Oral)  Resp 12  Wt 116 lb 9.6 oz (52.889 kg)  SpO2 97% Wt Readings from Last 3 Encounters:  03/21/14 116 lb 9.6 oz (52.889 kg)  02/18/14 112 lb (50.803 kg)  02/17/14 111 lb 3.2 oz (50.44 kg)   Constitutional: thin, in NAD Eyes: PERRLA, EOMI, no exophthalmos ENT: moist mucous membranes, no thyromegaly - but full R thyroid lobe, no cervical lymphadenopathy Cardiovascular: tachycardia, RR, No MRG Respiratory: CTA B Gastrointestinal: abdomen soft, NT, ND, BS+ Musculoskeletal: no deformities, strength intact in all 4 Skin: moist, warm, no rashes Neurological: no tremor with outstretched hands, DTR normal in all 4  ASSESSMENT: 1. DM2, non-insulin-dependent, uncontrolled, without complications - new dx  2. H/o Hyperthyroidism - 20 years ago - was on meds, cannot remember name  PLAN:  1. Patient with recently dx'ed diabetes, on oral antidiabetic regimen, now with improved control. She had abd  pain after she started Glipizide, now resolved. - I suggested to:  Patient Instructions  Please continue Metformin 850 mg 2x a day. Continue Glipizide 5 mg 2x a day before breakfast and before dinner. Please return in 2 months with your sugar log.  - we may need to add Januvia at next visit - continue checking sugars at different times of the day - check 2 times a day, rotating checks - she is up to date with yearly eye exams - refilled her lancets - Return to clinic in 2 mo  with sugar log - will check Hba1c then  2. H/o hyperthyroidism - appears euthyroid - last TFTs normal at last visit - may need a thyroid U/S to investigate the R thyroid fullness - for now we will follow this clinically. She is asymptomatic.

## 2014-03-21 NOTE — Patient Instructions (Signed)
Please continue Metformin 850 mg 2x a day. Continue Glipizide 5 mg 2x a day before breakfast and before dinner.  Please return in 2 months with your sugar log.

## 2014-04-07 ENCOUNTER — Other Ambulatory Visit: Payer: Self-pay | Admitting: Family Medicine

## 2014-05-19 ENCOUNTER — Other Ambulatory Visit: Payer: Self-pay

## 2014-05-19 MED ORDER — ATORVASTATIN CALCIUM 20 MG PO TABS
ORAL_TABLET | ORAL | Status: DC
Start: 2014-05-19 — End: 2014-05-25

## 2014-05-23 ENCOUNTER — Ambulatory Visit (INDEPENDENT_AMBULATORY_CARE_PROVIDER_SITE_OTHER): Payer: Medicare Other | Admitting: Internal Medicine

## 2014-05-23 ENCOUNTER — Encounter: Payer: Self-pay | Admitting: Internal Medicine

## 2014-05-23 VITALS — BP 114/64 | HR 71 | Temp 97.9°F | Resp 12 | Wt 116.0 lb

## 2014-05-23 DIAGNOSIS — IMO0002 Reserved for concepts with insufficient information to code with codable children: Secondary | ICD-10-CM

## 2014-05-23 DIAGNOSIS — E1165 Type 2 diabetes mellitus with hyperglycemia: Secondary | ICD-10-CM

## 2014-05-23 DIAGNOSIS — E118 Type 2 diabetes mellitus with unspecified complications: Principal | ICD-10-CM

## 2014-05-23 DIAGNOSIS — Z8639 Personal history of other endocrine, nutritional and metabolic disease: Secondary | ICD-10-CM

## 2014-05-23 DIAGNOSIS — Z862 Personal history of diseases of the blood and blood-forming organs and certain disorders involving the immune mechanism: Secondary | ICD-10-CM

## 2014-05-23 MED ORDER — METFORMIN HCL ER 750 MG PO TB24: 750.0000 mg | ORAL_TABLET | Freq: Every day | ORAL | Status: DC

## 2014-05-23 MED ORDER — LINAGLIPTIN 5 MG PO TABS: 5.0000 mg | ORAL_TABLET | Freq: Every day | ORAL | Status: DC

## 2014-05-23 NOTE — Progress Notes (Signed)
Patient ID: Brandi Mccormick, female   DOB: March 26, 1941, 73 y.o.   MRN: [id number]  HPI: Brandi Mccormick is a 73 y.o.-year-old female, returning for f/u for DM2, dx 2013, non-insulin-dependent, uncontrolled, without complications and h/o hyperthyroidism. Last visit 1.5 mo ago.   She has a h/o hyperthyroidism, tx with meds ~20 years ago. No other history available.   Recent labs reviewed - normal now: Lab Results  Component Value Date   TSH 0.44 02/17/2014   TSH 0.28* 12/20/2013   FREET4 1.00 02/17/2014   FREET4 0.97 12/20/2013   DM2:  Last hemoglobin A1c was: Lab Results  Component Value Date   HGBA1C 8.1* 02/17/2014   HGBA1C 11.9* 11/16/2013   Pt is on a regimen of: - Metformin 850 mg po bid - Glipizide 5 mg bid  Pt now checks her sugars - 2x a day and they are worse: - am: n/c >> 170-230 >> 95-194 >> 163-247 - 2h after b'fast: n/c >> 144,160-280 >> 151, 157, 170 >> n/c - before lunch: n/c >> 178, 184 >> 187 >> n/c - 2h after lunch: n/c >>  226, 244, 354 >> 105, 173 >> 191-270 - before dinner: n/c >> 161, 174, 182, 211 >> n/c >> 70, 101, 149 - 2h after dinner: n/c >> 174-343 >> 153, 237, 240, 289 >> 158-225 - bedtime: n/c >> 182-280 >> 86, 111, 142, 195 >> 285, 313 - nighttime: n/c  no lows; ? If she  she has hypoglycemia awareness. Highest sugar was  289 x1 >> 313 x1.  Meter: OneTouch Verio.  Pt's meals are: - Breakfast: egg, coffee, juice; sometime - Lunch: No Lunch - Dinner: meat + vegetables - Snacks: 1-2  - no CKD, last BUN/creatinine:  Lab Results  Component Value Date   BUN 18 02/17/2014   CREATININE 0.7 02/17/2014  ACR 8 on 11/16/2013. - last set of lipids: Lab Results  Component Value Date   CHOL 205* 02/17/2014   HDL 61.70 02/17/2014   LDLCALC 125* 02/17/2014   TRIG 90.0 02/17/2014   CHOLHDL 3 02/17/2014  on Simvastatin. - last eye exam was in 12/06/2013. No DR.  - no numbness and tingling in her feet.   ROS: Constitutional: no weight gain/loss, no  fatigue, no subjective hyperthermia/hypothermia Eyes: no blurry vision, no xerophthalmia ENT: no sore throat, no nodules palpated in throat, no dysphagia/odynophagia, no hoarseness Cardiovascular: no CP/SOB/palpitations/leg swelling Respiratory: no cough/SOB Gastrointestinal: no N/V/+ D/no C Musculoskeletal: no muscle/joint aches Skin: no rashes Neurological: no tremors/numbness/tingling/dizziness  I reviewed pt's medications, allergies, PMH, social hx, family hx and no changes required, except as mentioned above.  PE: BP 114/64  Pulse 71  Temp(Src) 97.9 F (36.6 C) (Oral)  Resp 12  Wt 116 lb (52.617 kg)  SpO2 97% Wt Readings from Last 3 Encounters:  05/23/14 116 lb (52.617 kg)  03/21/14 116 lb 9.6 oz (52.889 kg)  02/18/14 112 lb (50.803 kg)   Constitutional: thin, in NAD Eyes: PERRLA, EOMI, no exophthalmos ENT: moist mucous membranes, no thyromegaly - but full R thyroid lobe, no cervical lymphadenopathy Cardiovascular: tachycardia, RR, No MRG Respiratory: CTA B Gastrointestinal: abdomen soft, NT, ND, BS+ Musculoskeletal: no deformities, strength intact in all 4 Skin: moist, warm, no rashes Neurological: no tremor with outstretched hands, DTR normal in all 4  ASSESSMENT: 1. DM2, non-insulin-dependent, uncontrolled, without complications - new dx  2. H/o Hyperthyroidism - 20 years ago - was on meds, cannot remember name  PLAN:  1. Patient with recently dx'ed  diabetes, on oral antidiabetic regimen, now worsened since last visit. She is getting diarrhea from Metformin. - I suggested to:  Patient Instructions  Please continue: - Glipizide 5 mg 2x a day  Stop: - Metformin  Start: - Metformin extended release 750 mg 2x a day with meals - Tradjenta 5 mg daily in am  Please return in 1.5 month with your sugar log.   Please stop at the lab.  - continue checking sugars at different times of the day - check 2 times a day, rotating checks - she is up to date with  yearly eye exams - will check HbA1c today - Return to clinic in 1.5 mo with sugar log   2. H/o hyperthyroidism - appears euthyroid - last TFTs normal at last visit - may need a thyroid U/S to investigate the R thyroid fullness - for now we will follow this clinically. She is asymptomatic.  Office Visit on 05/23/2014  Component Date Value Ref Range Status  . Hemoglobin A1C 05/23/2014 7.2* 4.6 - 6.5 % Final   Glycemic Control Guidelines for People with Diabetes:Non Diabetic:  <6%Goal of Therapy: <7%Additional Action Suggested:  >8%    The HbA1c improved further! However, there is a discrepancy between the higher sugars in her log and her HbA1c >> will need a fructosamine level at next check.

## 2014-05-23 NOTE — Patient Instructions (Addendum)
Please continue: - Glipizide 5 mg 2x a day  Stop: - Metformin  Start: - Metformin extended release 750 mg 2x a day with meals - Tradjenta 5 mg daily in am  Please return in 1.5 month with your sugar log.   Please stop at the lab.

## 2014-05-24 LAB — HEMOGLOBIN A1C: HEMOGLOBIN A1C: 7.2 % — AB (ref 4.6–6.5)

## 2014-05-25 ENCOUNTER — Other Ambulatory Visit: Payer: Self-pay

## 2014-05-25 MED ORDER — GLIPIZIDE 5 MG PO TABS: 5.0000 mg | ORAL_TABLET | Freq: Two times a day (BID) | ORAL | Status: DC

## 2014-05-25 MED ORDER — ATORVASTATIN CALCIUM 20 MG PO TABS: ORAL_TABLET | ORAL | Status: DC

## 2014-05-26 ENCOUNTER — Telehealth: Payer: Self-pay | Admitting: Internal Medicine

## 2014-05-26 NOTE — Telephone Encounter (Signed)
Patient is takinf metformin extended relese asnd stated that she can't afford it want to know if there is something else more affordable she can

## 2014-06-24 ENCOUNTER — Inpatient Hospital Stay (HOSPITAL_COMMUNITY)
Admission: EM | Admit: 2014-06-24 | Discharge: 2014-06-25 | DRG: 193 | Disposition: A | Discharge status: Home / Self Care | Payer: Medicare Other | Attending: Internal Medicine | Admitting: Internal Medicine

## 2014-06-24 ENCOUNTER — Encounter (HOSPITAL_COMMUNITY): Payer: Self-pay | Admitting: Emergency Medicine

## 2014-06-24 ENCOUNTER — Emergency Department (HOSPITAL_COMMUNITY): Payer: Medicare Other

## 2014-06-24 DIAGNOSIS — E43 Unspecified severe protein-calorie malnutrition: Secondary | ICD-10-CM | POA: Insufficient documentation

## 2014-06-24 DIAGNOSIS — Z880 Allergy status to penicillin: Secondary | ICD-10-CM

## 2014-06-24 DIAGNOSIS — Z79899 Other long term (current) drug therapy: Secondary | ICD-10-CM

## 2014-06-24 DIAGNOSIS — E0590 Thyrotoxicosis, unspecified without thyrotoxic crisis or storm: Secondary | ICD-10-CM | POA: Diagnosis present

## 2014-06-24 DIAGNOSIS — E119 Type 2 diabetes mellitus without complications: Secondary | ICD-10-CM

## 2014-06-24 DIAGNOSIS — Z6820 Body mass index (BMI) 20.0-20.9, adult: Secondary | ICD-10-CM

## 2014-06-24 DIAGNOSIS — E46 Unspecified protein-calorie malnutrition: Secondary | ICD-10-CM | POA: Diagnosis present

## 2014-06-24 DIAGNOSIS — J189 Pneumonia, unspecified organism: Principal | ICD-10-CM | POA: Diagnosis present

## 2014-06-24 DIAGNOSIS — R0600 Dyspnea, unspecified: Secondary | ICD-10-CM

## 2014-06-24 DIAGNOSIS — R05 Cough: Secondary | ICD-10-CM | POA: Diagnosis present

## 2014-06-24 DIAGNOSIS — D473 Essential (hemorrhagic) thrombocythemia: Secondary | ICD-10-CM | POA: Diagnosis present

## 2014-06-24 DIAGNOSIS — Z8639 Personal history of other endocrine, nutritional and metabolic disease: Secondary | ICD-10-CM

## 2014-06-24 DIAGNOSIS — E876 Hypokalemia: Secondary | ICD-10-CM | POA: Diagnosis present

## 2014-06-24 LAB — BASIC METABOLIC PANEL
Anion gap: 16 — ABNORMAL HIGH (ref 5–15)
BUN: 15 mg/dL (ref 6–23)
CO2: 27 mEq/L (ref 19–32)
Calcium: 9.5 mg/dL (ref 8.4–10.5)
Chloride: 92 mEq/L — ABNORMAL LOW (ref 96–112)
Creatinine, Ser: 0.62 mg/dL (ref 0.50–1.10)
GFR calc Af Amer: >90 mL/min (ref >90)
GFR calc non Af Amer: 88 mL/min — ABNORMAL LOW (ref >90)
Glucose, Bld: 183 mg/dL — ABNORMAL HIGH (ref 70–99)
Potassium: 3.2 mEq/L — ABNORMAL LOW (ref 3.7–5.3)
Sodium: 135 mEq/L — ABNORMAL LOW (ref 137–147)

## 2014-06-24 LAB — HIV ANTIBODY (ROUTINE TESTING W REFLEX): HIV: NONREACTIVE

## 2014-06-24 LAB — GLUCOSE, CAPILLARY
GLUCOSE-CAPILLARY: 98 mg/dL (ref 70–99)
Glucose-Capillary: 167 mg/dL — ABNORMAL HIGH (ref 70–99)

## 2014-06-24 LAB — CBC
HCT: 38.9 % (ref 36.0–46.0)
Hemoglobin: 13.7 g/dL (ref 12.0–15.0)
MCH: 30.1 pg (ref 26.0–34.0)
MCHC: 35.2 g/dL (ref 30.0–36.0)
MCV: 85.5 fL (ref 78.0–100.0)
Platelets: 632 10*3/uL — ABNORMAL HIGH (ref 150–400)
RBC: 4.55 MIL/uL (ref 3.87–5.11)
RDW: 15.4 % (ref 11.5–15.5)
WBC: 13.7 10*3/uL — ABNORMAL HIGH (ref 4.0–10.5)

## 2014-06-24 MED ORDER — GUAIFENESIN ER 600 MG PO TB12
600.0000 mg | ORAL_TABLET | Freq: Two times a day (BID) | ORAL | Status: DC
  Administered 2014-06-24 – 2014-06-25 (×2): 600 mg via ORAL
  Filled 2014-06-24 (×3): qty 1

## 2014-06-24 MED ORDER — POTASSIUM CHLORIDE CRYS ER 20 MEQ PO TBCR
40.0000 meq | EXTENDED_RELEASE_TABLET | Freq: Once | ORAL | Status: AC
  Administered 2014-06-24: 40 meq via ORAL
  Filled 2014-06-24: qty 2

## 2014-06-24 MED ORDER — LINAGLIPTIN 5 MG PO TABS
5.0000 mg | ORAL_TABLET | Freq: Every day | ORAL | Status: DC
  Administered 2014-06-24 – 2014-06-25 (×2): 5 mg via ORAL
  Filled 2014-06-24 (×2): qty 1

## 2014-06-24 MED ORDER — DEXTROSE 5 % IV SOLN
500.0000 mg | INTRAVENOUS | Status: DC
  Administered 2014-06-25: 500 mg via INTRAVENOUS
  Filled 2014-06-24: qty 500

## 2014-06-24 MED ORDER — ONDANSETRON HCL 4 MG PO TABS: 4.0000 mg | ORAL_TABLET | Freq: Four times a day (QID) | ORAL | Status: DC | PRN

## 2014-06-24 MED ORDER — INSULIN ASPART 100 UNIT/ML ~~LOC~~ SOLN
0.0000 [IU] | Freq: Three times a day (TID) | SUBCUTANEOUS | Status: DC
Start: 2014-06-24 — End: 2014-06-25
  Administered 2014-06-24: 3 [IU] via SUBCUTANEOUS

## 2014-06-24 MED ORDER — GLIPIZIDE 5 MG PO TABS
5.0000 mg | ORAL_TABLET | Freq: Two times a day (BID) | ORAL | Status: DC
  Administered 2014-06-24 – 2014-06-25 (×2): 5 mg via ORAL
  Filled 2014-06-24 (×4): qty 1

## 2014-06-24 MED ORDER — DEXTROSE 5 % IV SOLN
1.0000 g | Freq: Once | INTRAVENOUS | Status: AC
  Administered 2014-06-24: 1 g via INTRAVENOUS
  Filled 2014-06-24: qty 10

## 2014-06-24 MED ORDER — AZITHROMYCIN 500 MG IV SOLR
500.0000 mg | Freq: Once | INTRAVENOUS | Status: DC
  Filled 2014-06-24: qty 500

## 2014-06-24 MED ORDER — IPRATROPIUM-ALBUTEROL 0.5-2.5 (3) MG/3ML IN SOLN
3.0000 mL | Freq: Once | RESPIRATORY_TRACT | Status: AC
  Administered 2014-06-24: 3 mL via RESPIRATORY_TRACT
  Filled 2014-06-24: qty 3

## 2014-06-24 MED ORDER — ACETAMINOPHEN 325 MG PO TABS: 650.0000 mg | ORAL_TABLET | Freq: Four times a day (QID) | ORAL | Status: DC | PRN

## 2014-06-24 MED ORDER — ENOXAPARIN SODIUM 40 MG/0.4ML ~~LOC~~ SOLN
40.0000 mg | SUBCUTANEOUS | Status: DC
  Administered 2014-06-24: 40 mg via SUBCUTANEOUS
  Filled 2014-06-24 (×2): qty 0.4

## 2014-06-24 MED ORDER — ACETAMINOPHEN 650 MG RE SUPP: 650.0000 mg | Freq: Four times a day (QID) | RECTAL | Status: DC | PRN

## 2014-06-24 MED ORDER — ATORVASTATIN CALCIUM 20 MG PO TABS
20.0000 mg | ORAL_TABLET | Freq: Every day | ORAL | Status: DC
  Administered 2014-06-24 – 2014-06-25 (×2): 20 mg via ORAL
  Filled 2014-06-24 (×2): qty 1

## 2014-06-24 MED ORDER — ONDANSETRON HCL 4 MG/2ML IJ SOLN: 4.0000 mg | Freq: Four times a day (QID) | INTRAMUSCULAR | Status: DC | PRN

## 2014-06-24 MED ORDER — INSULIN ASPART 100 UNIT/ML ~~LOC~~ SOLN: 0.0000 [IU] | Freq: Every day | SUBCUTANEOUS | Status: DC

## 2014-06-24 MED ORDER — CEFTRIAXONE SODIUM 1 G IJ SOLR
1.0000 g | INTRAMUSCULAR | Status: DC
  Administered 2014-06-25: 1 g via INTRAVENOUS
  Filled 2014-06-24: qty 10

## 2014-06-24 MED ORDER — SODIUM CHLORIDE 0.9 % IV SOLN: INTRAVENOUS | Status: DC

## 2014-06-24 NOTE — ED Notes (Signed)
Bed: [id number] Expected date:  Expected time:  Means of arrival:  Comments: RN has RM #[id number]

## 2014-06-24 NOTE — ED Provider Notes (Signed)
CSN: [id number]     Arrival date & time 06/24/14  [id number] History   First MD Initiated Contact with Patient 06/24/14 [phone]     Chief Complaint  Patient presents with  . Cough  . Nasal Congestion     (Consider location/radiation/quality/duration/timing/severity/associated sxs/prior Treatment) HPI  73 year old female with history of type 2 diabetes mellitus on oral hypoglycemics, presented to the ED with for one week of cough with yellowish phlegm and dyspnea on exertion. Patient reported subjective fever yesterday but did not record her temperature. She reported having chills . Patient denied headache, dizziness, nausea , vomiting, chest pain, palpitations, bowel or urinary symptoms. Reported feeling weak with poor appetite. She reported dull aching abdominal pain for several weeks. denies any recent travel or sick contacts.   Past Medical History  Diagnosis Date  . Diabetes   . Thyroid disease     Not on med's  . Shingles    Past Surgical History  Procedure Laterality Date  . Dental surgery     Family History  Problem Relation Age of Onset  . Diabetes Mother     and on mother's side of family  . Colon cancer Mother   . Stroke Mother   . Diabetes Father     and on father's side of family  . Diabetes Sister   . Diabetes Brother   . Diabetes Sister    History  Substance Use Topics  . Smoking status: Never Smoker   . Smokeless tobacco: Never Used  . Alcohol Use: No   OB History   Grav Para Term Preterm Abortions TAB SAB Ect Mult Living                 Review of Systems  All systems reviewed and negative, other than as noted in HPI.    Allergies  Penicillins  Home Medications   Prior to Admission medications   Medication Sig Start Date End Date Taking? Authorizing Provider  atorvastatin (LIPITOR) 20 MG tablet Take 20 mg by mouth daily.   Yes Historical Provider, MD  glipiZIDE (GLUCOTROL) 5 MG tablet Take 1 tablet (5 mg total) by mouth 2 (two) times daily before  a meal. 05/25/14  Yes Yvonne R Lowne, DO  linagliptin (TRADJENTA) 5 MG TABS tablet Take 1 tablet (5 mg total) by mouth daily. 05/23/14  Yes Philemon Kingdom, MD  metFORMIN (GLUCOPHAGE-XR) 750 MG 24 hr tablet Take 1 tablet (750 mg total) by mouth daily with breakfast. 05/23/14  Yes Philemon Kingdom, MD   BP 137/67  Pulse 85  Temp(Src) 97.6 F (36.4 C) (Oral)  Resp 16 Physical Exam  Nursing note and vitals reviewed. Constitutional: She appears well-developed and well-nourished. No distress.  HENT:  Head: Normocephalic and atraumatic.  Eyes: Conjunctivae are normal. Right eye exhibits no discharge. Left eye exhibits no discharge.  Neck: Neck supple.  Cardiovascular: Normal rate, regular rhythm and normal heart sounds.  Exam reveals no gallop and no friction rub.   No murmur heard. Pulmonary/Chest: Effort normal and breath sounds normal. No respiratory distress.  Decreased breath sounds right side, but I do not hear any adventitious sounds.  Abdominal: Soft. She exhibits no distension. There is no tenderness.  Musculoskeletal: She exhibits no edema and no tenderness.  Lower extremities symmetric as compared to each other. No calf tenderness. Negative Homan's. No palpable cords.   Neurological: She is alert.  Skin: Skin is warm and dry.  Psychiatric: She has a normal mood and affect. Her behavior is normal.  Thought content normal.    ED Course  Procedures (including critical care time) Labs Review Labs Reviewed  BASIC METABOLIC PANEL - Abnormal; Notable for the following:    Sodium 135 (*)    Potassium 3.2 (*)    Chloride 92 (*)    Glucose, Bld 183 (*)    GFR calc non Af Amer 88 (*)    Anion gap 16 (*)    All other components within normal limits  CBC - Abnormal; Notable for the following:    WBC 13.7 (*)    Platelets 632 (*)    All other components within normal limits    Imaging Review Dg Chest 2 View (if Patient Has Fever And/or Copd)  06/24/2014   CLINICAL DATA:   Coughing ingestion getting worse of the last week  EXAM: CHEST  2 VIEW  COMPARISON:  None.  FINDINGS: Cardiac shadow is within normal limits. The lungs are well aerated bilaterally. Increased density is noted in the right lung base projecting in the right lower lobe on the lateral projection consistent with acute infiltrate. No acute bony abnormality is seen. No other focal abnormality is noted.  IMPRESSION: Right lower lobe pneumonia.   Electronically Signed   By: Inez Catalina M.D.   On: 06/24/2014 10:32     EKG Interpretation None      MDM   Final diagnoses:  Community acquired pneumonia  Hypokalemia  Protein-calorie malnutrition  Diabetes mellitus type 2, noninsulin dependent  Protein-calorie malnutrition, severe  Dyspnea  H/O hyperthyroidism  CAP (community acquired pneumonia)    73 year old female with shortness of breath. She does not appear distressed, but she is requiring small amount of supplemental oxygen to keep her saturations in the 90s. No home oxygen requirement. Given her age and this requirement, will admit.    Virgel Manifold, MD 06/29/14 1102

## 2014-06-24 NOTE — Progress Notes (Signed)
UR completed 

## 2014-06-24 NOTE — ED Notes (Signed)
Per pt, states cold symptoms for about a week-increase cough-states she took mucinex and it made it worse-place on [address] [place]

## 2014-06-24 NOTE — H&P (Signed)
[facility] History and Physical  Brandi Mccormick [id number] DOB: Jun 05, 1941 DOA: 06/24/2014  Referring physician: Dr Wilson Singer PCP: Garnet Koyanagi, DO   Chief Complaint:  Cough and shortness of breath for one week   HPI:  73 year old female with history of type 2 diabetes mellitus on oral hypoglycemics, presented to the ED with for one week of cough with yellowish phlegm and dyspnea on exertion. Patient reports subjective fever yesterday but did not record her temperature. She reports having chills .  Patient denies headache, dizziness, nausea , vomiting, chest pain, palpitations,  bowel or urinary symptoms. Reports feeling weak with poor appetite. She reports dull aching abdominal pain for several weeks.  denies any recent travel or sick contacts.  Course in the ED Patient was afebrile and vitals stable. But will go toward a lesion 30.7, hemoglobin of 13.7 and platelets of 632. Chemistry shows sodium of 135, potassium 3.2 and chloride of 92 with normal renal function.  Chest x-ray showed right lower lobe pneumonia. Patient given a dose of IV Rocephin and azithromycin and hospitalists admission requested the medical floor.   Review of Systems:  Constitutional: fever, chills, diaphoresis, appetite change and fatigue.  HEENT: Denies visual symptoms, ear pain, nasal congestion, sore throat, rhinorrhea, sneezing,  trouble swallowing, neck pain,  Respiratory: Dyspnea on exertion and cough, denies chest tightness,  and wheezing.   Cardiovascular: Denies chest pain, palpitations and leg swelling.  Gastrointestinal: Dull abdominal pain, Denies nausea, vomiting,  diarrhea, constipation, blood in stool and abdominal distention.  Genitourinary: Denies dysuria, urgency, frequency, hematuria, flank pain and difficulty urinating.  Endocrine: Denies: hot or cold intolerance,polyuria, polydipsia. Musculoskeletal: Denies myalgias, back pain, arthralgia Skin: Denies  rash and wound.  Neurological:  Generalized weakness, Denies dizziness,  syncope, weakness, light-headedness, numbness and headaches.  Psychiatric/Behavioral: Denies  confusion,    Past Medical History  Diagnosis Date  . Diabetes   . Thyroid disease     Not on med's  . Shingles    Past Surgical History  Procedure Laterality Date  . Dental surgery     Social History:  reports that she has never smoked. She has never used smokeless tobacco. She reports that she does not drink alcohol or use illicit drugs.  Allergies  Allergen Reactions  . Penicillins Nausea And Vomiting    Family History  Problem Relation Age of Onset  . Diabetes Mother     and on mother's side of family  . Colon cancer Mother   . Stroke Mother   . Diabetes Father     and on father's side of family  . Diabetes Sister   . Diabetes Brother   . Diabetes Sister     Prior to Admission medications   Medication Sig Start Date End Date Taking? Authorizing Provider  atorvastatin (LIPITOR) 20 MG tablet Take 20 mg by mouth daily.   Yes Historical Provider, MD  glipiZIDE (GLUCOTROL) 5 MG tablet Take 1 tablet (5 mg total) by mouth 2 (two) times daily before a meal. 05/25/14  Yes Yvonne R Lowne, DO  linagliptin (TRADJENTA) 5 MG TABS tablet Take 1 tablet (5 mg total) by mouth daily. 05/23/14  Yes Philemon Kingdom, MD  metFORMIN (GLUCOPHAGE-XR) 750 MG 24 hr tablet Take 1 tablet (750 mg total) by mouth daily with breakfast. 05/23/14  Yes Philemon Kingdom, MD     Physical Exam:  Filed Vitals:   06/24/14 0914 06/24/14 1129  BP: 137/67 157/67  Pulse: 85 76  Temp: 97.6 F (36.4 C)  TempSrc: Oral   Resp: 16 17  SpO2:  95%    Constitutional: Vital signs reviewed. Thin built elderly female in no acute distress HEENT: no pallor, no icterus, moist oral mucosa, no cervical lymphadenopathy Cardiovascular: RRR, S1 normal, S2 normal, no MRG Chest: CTAB, fine crackles over right lung base, no rhonchi or wheeze Abdominal: Soft. Non-tender, non-distended,  bowel sounds are normal,  Ext: warm, no edema Neurological: Alert and oriented  Labs on Admission:  Basic Metabolic Panel:  Recent Labs Lab 06/24/14 1005  NA 135*  K 3.2*  CL 92*  CO2 27  GLUCOSE 183*  BUN 15  CREATININE 0.62  CALCIUM 9.5   Liver Function Tests: No results found for this basename: AST, ALT, ALKPHOS, BILITOT, PROT, ALBUMIN,  in the last 168 hours No results found for this basename: LIPASE, AMYLASE,  in the last 168 hours No results found for this basename: AMMONIA,  in the last 168 hours CBC:  Recent Labs Lab 06/24/14 1005  WBC 13.7*  HGB 13.7  HCT 38.9  MCV 85.5  PLT 632*   Cardiac Enzymes: No results found for this basename: CKTOTAL, CKMB, CKMBINDEX, TROPONINI,  in the last 168 hours BNP: No components found with this basename: POCBNP,  CBG: No results found for this basename: GLUCAP,  in the last 168 hours  Radiological Exams on Admission: Dg Chest 2 View (if Patient Has Fever And/or Copd)  06/24/2014   CLINICAL DATA:  Coughing ingestion getting worse of the last week  EXAM: CHEST  2 VIEW  COMPARISON:  None.  FINDINGS: Cardiac shadow is within normal limits. The lungs are well aerated bilaterally. Increased density is noted in the right lung base projecting in the right lower lobe on the lateral projection consistent with acute infiltrate. No acute bony abnormality is seen. No other focal abnormality is noted.  IMPRESSION: Right lower lobe pneumonia.   Electronically Signed   By: Inez Catalina M.D.   On: 06/24/2014 10:32      Assessment/Plan  Principal Problem:   Community acquired pneumonia Admit to [place]. Continue empiric Rocephin and azithromycin Supportive care with when necessary O2 via nasal cannula, Tylenol and antitussives. Gentle IV hydration Follow blood culture, sputum culture, urine for strep antigen and Legionella. Check HIV antibody.  Active Problems:   Diabetes mellitus type 2, noninsulin dependent Resume home dose  glipizide and tradjenta. Hold metformin. He is on sliding-scale insulin      Protein-calorie malnutrition Nutrition consult    Hypokalemia Replenish     Diet: Diabetic  DVT prophylaxis: sq lovenox   Code Status: full code Family Communication: discussed with patient and her husband None at bedside Disposition Plan: Admit to [place]. Home once improved  Yaroslav Gombos, [place] [facility] Pager [phone]  Total time spent on admission :60 minutes  If 7PM-7AM, please contact night-coverage [email or web address] Password [email or web address] 06/24/2014, 12:08 PM

## 2014-06-24 NOTE — Progress Notes (Signed)
INITIAL NUTRITION ASSESSMENT  Pt meets criteria for severe MALNUTRITION in the context of acute illness as evidenced by intake <50% for > 1 month, decreased muscle mass and body fat.  DOCUMENTATION CODES Per approved criteria  -Severe malnutrition in the context of acute illness or injury   INTERVENTION: Continue CHO MOD diet Encouraged intake of meals Add HS snack Reviewed importance of regular meals and adequate intake of protein after discharge. RD to follow.  NUTRITION DIAGNOSIS: Inadequate oral inte related to tasted alteration, too tired to prepare food as evidenced by patient report..   Goal: Intake of meals and supplements to meet >90% estimated needs.  Monitor:  Intake, labs, weight trend  Reason for Assessment: MST and consult per progress notes  72 y.o. female  Admitting Dx: Community acquired pneumonia  ASSESSMENT: Patient admitted with community acquired pneumonia with hx of diabetes.  Patient states that diabetes was diagnosed this spring.  Reports very poor intake for the past week due to taste alterations.  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  Began eating breakfast this spring.  States that sister is a Marine scientist and has been lecturing her on her habits. Patient ate good lunch today.  Nutrition Focused Physical Exam:  Subcutaneous Fat:  Orbital Region: wnl Upper Arm Region: severe Thoracic and Lumbar Region: mild  Muscle:  Temple Region: severe Clavicle Bone Region: moderate Clavicle and Acromion Bone Region: mild Scapular Bone Region: moderate Dorsal Hand: severe Patellar Region: moderate Anterior Thigh Region: moderate Posterior Calf Region: moderate  Edema: not noted    Height: Ht Readings from Last 1 Encounters:  06/24/14 5\' 4"  (1.626 m)    Weight: Wt Readings from Last 1 Encounters:  06/24/14 119 lb  (53.978 kg)    Ideal Body Weight: 120 lbs  % Ideal Body Weight: 99  Wt Readings from Last 10 Encounters:  06/24/14 119 lb (53.978 kg)  05/23/14 116 lb (52.617 kg)  03/21/14 116 lb 9.6 oz (52.889 kg)  02/18/14 112 lb (50.803 kg)  02/17/14 111 lb 3.2 oz (50.44 kg)  12/20/13 111 lb 9.6 oz (50.621 kg)  11/16/13 112 lb 6.4 oz (50.984 kg)  10/23/13 111 lb 5 oz (50.491 kg)    Usual Body Weight: 116 lbs  % Usual Body Weight: 103  BMI:  Body mass index is 20.42 kg/(m^2).  Estimated Nutritional Needs: Kcal: 1400-1600 Protein: 65-75 gm Fluid: >1.4L daily  Skin: intact  Diet Order: Carb Control  EDUCATION NEEDS: -Education needs addressed  No intake or output data in the 24 hours ending 06/24/14 1600   Labs:   Recent Labs Lab 06/24/14 1005  NA 135*  K 3.2*  CL 92*  CO2 27  BUN 15  CREATININE 0.62  CALCIUM 9.5  GLUCOSE 183*    CBG (last 3)  No results found for this basename: GLUCAP,  in the last 72 hours  Scheduled Meds: . atorvastatin  20 mg Oral Daily  . azithromycin (ZITHROMAX) 500 MG IVPB  500 mg Intravenous Once  . [START ON 06/25/2014] azithromycin  500 mg Intravenous Q24H  . [START ON 06/25/2014] cefTRIAXone (ROCEPHIN)  IV  1 g Intravenous Q24H  . enoxaparin (LOVENOX) injection  40 mg Subcutaneous Q24H  . glipiZIDE  5 mg Oral BID AC  . guaiFENesin  600 mg Oral BID  . insulin aspart  0-15 Units  Subcutaneous TID WC  . insulin aspart  0-5 Units Subcutaneous QHS  . linagliptin  5 mg Oral Daily  . potassium chloride  40 mEq Oral Once    Continuous Infusions: . sodium chloride 75 mL/hr at 06/24/14 1458    Past Medical History  Diagnosis Date  . Diabetes   . Thyroid disease     Not on med's  . Shingles     Past Surgical History  Procedure Laterality Date  . Dental surgery      Antonieta Iba, RD, LDN Clinical Inpatient Dietitian Pager:  [phone] Weekend and after hours pager:  [phone]

## 2014-06-25 DIAGNOSIS — E43 Unspecified severe protein-calorie malnutrition: Secondary | ICD-10-CM

## 2014-06-25 DIAGNOSIS — E119 Type 2 diabetes mellitus without complications: Secondary | ICD-10-CM

## 2014-06-25 DIAGNOSIS — R0600 Dyspnea, unspecified: Secondary | ICD-10-CM

## 2014-06-25 LAB — BASIC METABOLIC PANEL
ANION GAP: 11 (ref 5–15)
BUN: 13 mg/dL (ref 6–23)
CALCIUM: 8.8 mg/dL (ref 8.4–10.5)
CO2: 26 mEq/L (ref 19–32)
Chloride: 102 mEq/L (ref 96–112)
Creatinine, Ser: 0.57 mg/dL (ref 0.50–1.10)
GFR calc Af Amer: >90 mL/min (ref >90)
GFR calc non Af Amer: >90 mL/min (ref >90)
Glucose, Bld: 106 mg/dL — ABNORMAL HIGH (ref 70–99)
Potassium: 4.6 mEq/L (ref 3.7–5.3)
SODIUM: 139 meq/L (ref 137–147)

## 2014-06-25 LAB — CBC
HEMATOCRIT: 37.6 % (ref 36.0–46.0)
Hemoglobin: 13.2 g/dL (ref 12.0–15.0)
MCH: 31.2 pg (ref 26.0–34.0)
MCHC: 35.1 g/dL (ref 30.0–36.0)
MCV: 88.9 fL (ref 78.0–100.0)
PLATELETS: 650 10*3/uL — AB (ref 150–400)
RBC: 4.23 MIL/uL (ref 3.87–5.11)
RDW: 15.8 % — AB (ref 11.5–15.5)
WBC: 12.2 10*3/uL — ABNORMAL HIGH (ref 4.0–10.5)

## 2014-06-25 LAB — GLUCOSE, CAPILLARY
GLUCOSE-CAPILLARY: 109 mg/dL — AB (ref 70–99)
Glucose-Capillary: 166 mg/dL — ABNORMAL HIGH (ref 70–99)

## 2014-06-25 LAB — STREP PNEUMONIAE URINARY ANTIGEN: Strep Pneumo Urinary Antigen: NEGATIVE

## 2014-06-25 MED ORDER — LEVOFLOXACIN 750 MG PO TABS: 750.0000 mg | ORAL_TABLET | Freq: Every day | ORAL | Status: DC

## 2014-06-25 MED ORDER — GUAIFENESIN ER 600 MG PO TB12: 600.0000 mg | ORAL_TABLET | Freq: Two times a day (BID) | ORAL | Status: DC

## 2014-06-25 NOTE — Progress Notes (Signed)
Discharge instructions given to pt, verbalized understanding. Left the unit in stable condition. 

## 2014-06-25 NOTE — Progress Notes (Signed)
Pt ambulated Halls on room air with sat 94%, no distress.Will continue to monitor.

## 2014-06-25 NOTE — Discharge Summary (Signed)
Physician Discharge Summary  ABIGEAL POERTNER [id number] DOB: [birth year] DOA: 06/24/2014  PCP: Garnet Koyanagi, DO  Admit date: 06/24/2014 Discharge date: 06/25/2014  Time spent: 45 minutes  Recommendations for Outpatient Follow-up:  Patient will be discharged home. She is to followup with her primary care physician within one week of discharge. Patient should continue her medications as prescribed. Patient should follow a carb modified diet.  Discharge Diagnoses:  Principal Problem:   Community acquired pneumonia Active Problems:   Diabetes mellitus type 2, noninsulin dependent   Hypokalemia   CAP (community acquired pneumonia)   Protein-calorie malnutrition, severe  Discharge Condition: Stable  Diet recommendation: Carb modified  Filed Weights   06/24/14 1355  Weight: 53.978 kg (119 lb)    History of present illness:  On 06/24/2014 73 year old female with history of type 2 diabetes mellitus on oral hypoglycemics, presented to the ED with for one week of cough with yellowish phlegm and dyspnea on exertion. Patient reported subjective fever yesterday but did not record her temperature. She reported having chills . Patient denied headache, dizziness, nausea , vomiting, chest pain, palpitations, bowel or urinary symptoms. Reported feeling weak with poor appetite. She reported dull aching abdominal pain for several weeks. denies any recent travel or sick contacts.  Hospital Course:  Dyspnea secondary to Community acquired pneumonia -Chest x-ray: Right lower lobe pneumonia -Patient initially placed on Rocephin and azithromycin, will be discharged with Levaquin -Patient required oxygen upon admission however were conducted while chest, patient maintained her oxygen saturations above 92% -Continue antitussives -Strep pneumoniae urine antigen negative, legionella urine antigen pending -Patient states her breathing has improved  Diabetes mellitus, type II -Continue home  regimen  Severe protein calorie malnutrition -Nutrition consulted and recommended nutrition supplementation  Thrombocytosis -Possibly reactive, patient to followup with primary care physician  Hypokalemia -Likely secondary to poor oral intake -Resolved  Procedures:  None  Consultations:  None  Discharge Exam: Filed Vitals:   06/25/14 0552  BP: 148/74  Pulse: 89  Temp: 97.9 F (36.6 C)  Resp: 20     General: Well developed, thin, NAD, appears stated age  HEENT: NCAT, mucous membranes moist.  Cardiovascular: S1 S2 auscultated, no rubs, murmurs or gallops. Regular rate and rhythm.  Respiratory: Fine crackles noted over the right lung base, otherwise clear, good air movement   Abdomen: Soft, nontender, nondistended, + bowel sounds  Extremities: warm dry without cyanosis clubbing or edema  Neuro: AAOx3, no focal deficits  Psych: Normal affect and demeanor   Discharge Instructions      Discharge Instructions   Discharge instructions    Complete by:  As directed   Patient will be discharged home. She is to followup with her primary care physician within one week of discharge. Patient should continue her medications as prescribed. Patient should follow a carb modified diet.            Medication List         atorvastatin 20 MG tablet  Commonly known as:  LIPITOR  Take 20 mg by mouth daily.     glipiZIDE 5 MG tablet  Commonly known as:  GLUCOTROL  Take 1 tablet (5 mg total) by mouth 2 (two) times daily before a meal.     guaiFENesin 600 MG 12 hr tablet  Commonly known as:  MUCINEX  Take 1 tablet (600 mg total) by mouth 2 (two) times daily.     levofloxacin 750 MG tablet  Commonly known as:  LEVAQUIN  Take 1 tablet (  750 mg total) by mouth daily.     linagliptin 5 MG Tabs tablet  Commonly known as:  TRADJENTA  Take 1 tablet (5 mg total) by mouth daily.     metFORMIN 750 MG 24 hr tablet  Commonly known as:  GLUCOPHAGE-XR  Take 1 tablet (750 mg  total) by mouth daily with breakfast.       Allergies  Allergen Reactions  . Penicillins Nausea And Vomiting   Follow-up Information   Follow up with Garnet Koyanagi, DO. Schedule an appointment as soon as possible for a visit in 1 week. [facility] followup)    Specialty:  Family Medicine   Contact information:   [place] [address] [phone]        The results of significant diagnostics from this hospitalization (including imaging, microbiology, ancillary and laboratory) are listed below for reference.    Significant Diagnostic Studies: Dg Chest 2 View (if Patient Has Fever And/or Copd)  06/24/2014   CLINICAL DATA:  Coughing ingestion getting worse of the last week  EXAM: CHEST  2 VIEW  COMPARISON:  None.  FINDINGS: Cardiac shadow is within normal limits. The lungs are well aerated bilaterally. Increased density is noted in the right lung base projecting in the right lower lobe on the lateral projection consistent with acute infiltrate. No acute bony abnormality is seen. No other focal abnormality is noted.  IMPRESSION: Right lower lobe pneumonia.   Electronically Signed   By: Inez Catalina M.D.   On: 06/24/2014 10:32    Microbiology: No results found for this or any previous visit (from the past 240 hour(s)).   Labs: Basic Metabolic Panel:  Recent Labs Lab 06/24/14 1005 06/25/14 0544  NA 135* 139  K 3.2* 4.6  CL 92* 102  CO2 27 26  GLUCOSE 183* 106*  BUN 15 13  CREATININE 0.62 0.57  CALCIUM 9.5 8.8   Liver Function Tests: No results found for this basename: AST, ALT, ALKPHOS, BILITOT, PROT, ALBUMIN,  in the last 168 hours No results found for this basename: LIPASE, AMYLASE,  in the last 168 hours No results found for this basename: AMMONIA,  in the last 168 hours CBC:  Recent Labs Lab 06/24/14 1005 06/25/14 0544  WBC 13.7* 12.2*  HGB 13.7 13.2  HCT 38.9 37.6  MCV 85.5 88.9  PLT 632* 650*   Cardiac Enzymes: No results found  for this basename: CKTOTAL, CKMB, CKMBINDEX, TROPONINI,  in the last 168 hours BNP: BNP (last 3 results) No results found for this basename: PROBNP,  in the last 8760 hours CBG:  Recent Labs Lab 06/24/14 1722 06/24/14 2148 06/25/14 0729  GLUCAP 167* 98 109*       Signed:  Halcyon Heck  [facility] 06/25/2014, 10:35 AM

## 2014-06-25 NOTE — Discharge Instructions (Signed)

## 2014-06-26 LAB — LEGIONELLA ANTIGEN, URINE

## 2014-06-27 ENCOUNTER — Telehealth: Payer: Self-pay

## 2014-06-27 NOTE — Telephone Encounter (Signed)
Admit date: 06/24/2014  Discharge date: 06/25/2014  Reason for admission:  Community acquired pneumonia-Chest x-ray: Right lower lobe pneumonia  Transition Care Management Follow-up Telephone Call  How have you been since you were released from the hospital? Pt states that she is doing much better.  She continues to have a cough, but it has improved.  She denies shortness of breath, wheezing, chest pain/tightness, and fever.  She is eating and drinking fluids without difficulty.  She is a diabetic and her blood sugar this morning was 105 before breakfast.    Do you understand why you were in the hospital? yes   Do you understand the discharge instructions? yes  Items Reviewed:  Medications reviewed: yes, has been taking antibiotic, Levaquin, as prescribed.  Allergies reviewed: yes  Dietary changes reviewed: yes, carb modified diet  Referrals reviewed: n/a   Functional Questionnaire:   Activities of Daily Living (ADLs):   She states they are independent in the following: ambulation, bathing and hygiene, feeding, continence, grooming, toileting and dressing States they require assistance with the following: n/a   Any transportation issues/concerns?: no   Any patient concerns? no   Confirmed importance and date/time of follow-up visits scheduled: yes   Confirmed with patient if condition begins to worsen call PCP or go to the ER.  Patient was given the Call-a-Nurse line [phone]: yes   Hospital follow up appointment scheduled with Elyn Aquas, PA-C on 07/01/14 @ 11:15 am.

## 2014-06-27 NOTE — Telephone Encounter (Signed)
Thank you for making me aware.

## 2014-06-30 LAB — CULTURE, BLOOD (ROUTINE X 2)
Culture: NO GROWTH
Culture: NO GROWTH

## 2014-07-01 ENCOUNTER — Encounter: Payer: Self-pay | Admitting: Physician Assistant

## 2014-07-01 ENCOUNTER — Ambulatory Visit (INDEPENDENT_AMBULATORY_CARE_PROVIDER_SITE_OTHER): Payer: Medicare Other | Admitting: Physician Assistant

## 2014-07-01 VITALS — BP 154/81 | HR 95 | Temp 98.0°F | Resp 16 | Ht 64.0 in | Wt 114.1 lb

## 2014-07-01 DIAGNOSIS — K219 Gastro-esophageal reflux disease without esophagitis: Secondary | ICD-10-CM

## 2014-07-01 DIAGNOSIS — D45 Polycythemia vera: Secondary | ICD-10-CM | POA: Insufficient documentation

## 2014-07-01 DIAGNOSIS — D473 Essential (hemorrhagic) thrombocythemia: Secondary | ICD-10-CM

## 2014-07-01 DIAGNOSIS — D75839 Thrombocytosis, unspecified: Secondary | ICD-10-CM

## 2014-07-01 DIAGNOSIS — J189 Pneumonia, unspecified organism: Secondary | ICD-10-CM

## 2014-07-01 LAB — BASIC METABOLIC PANEL
BUN: 14 mg/dL (ref 6–23)
CO2: 27 mEq/L (ref 19–32)
CREATININE: 0.7 mg/dL (ref 0.4–1.2)
Calcium: 10.1 mg/dL (ref 8.4–10.5)
Chloride: 101 mEq/L (ref 96–112)
GFR: 83.12 mL/min (ref >60.00)
Glucose, Bld: 71 mg/dL (ref 70–99)
Potassium: 4.4 mEq/L (ref 3.5–5.1)
Sodium: 141 mEq/L (ref 135–145)

## 2014-07-01 LAB — CBC
HCT: 42.4 % (ref 36.0–46.0)
HEMOGLOBIN: 14.3 g/dL (ref 12.0–15.0)
MCHC: 33.8 g/dL (ref 30.0–36.0)
MCV: 90.9 fl (ref 78.0–100.0)
Platelets: 834 10*3/uL — ABNORMAL HIGH (ref 150.0–400.0)
RBC: 4.66 Mil/uL (ref 3.87–5.11)
RDW: 17.3 % — AB (ref 11.5–15.5)
WBC: 12.3 10*3/uL — ABNORMAL HIGH (ref 4.0–10.5)

## 2014-07-01 MED ORDER — RANITIDINE HCL 150 MG PO TABS: 150.0000 mg | ORAL_TABLET | Freq: Every day | ORAL | Status: DC

## 2014-07-01 MED ORDER — AZITHROMYCIN 250 MG PO TABS: ORAL_TABLET | ORAL | Status: DC

## 2014-07-01 NOTE — Progress Notes (Signed)
Patient presents to clinic today for hospital follow-up of Community-Acquired Pneumonia and reactive thrombocytosis.  Patient admitted to the hospital on 06/24/14 after initial ER evaluation.  Hospital records reviewed.  Patient with RLL pneumonia on CXR.  Was initially treated with Rocephin and Azithromycin in the hospital.  Once ready for discharge, patient was given a 5-day course of Levaquin.  Patient endorses taking as directed.  Finished last pill 2 days ago.  Endorses marked improvement in respiratory symptoms. Denies cough, chest congestion, pleuritic chest pain or fever.  Endorses fatigue.  Denies new symptoms.  Of note CBC in hospital revealed thrombocytosis though to be reactive in nature.  Will need repeat CBC.   Past Medical History  Diagnosis Date  . Diabetes   . Thyroid disease     Not on med's  . Shingles     Current Outpatient Prescriptions on File Prior to Visit  Medication Sig Dispense Refill  . atorvastatin (LIPITOR) 20 MG tablet Take 20 mg by mouth daily.      Marland Kitchen glipiZIDE (GLUCOTROL) 5 MG tablet Take 1 tablet (5 mg total) by mouth 2 (two) times daily before a meal.  180 tablet  1  . guaiFENesin (MUCINEX) 600 MG 12 hr tablet Take 1 tablet (600 mg total) by mouth 2 (two) times daily.  10 tablet  0  . linagliptin (TRADJENTA) 5 MG TABS tablet Take 1 tablet (5 mg total) by mouth daily.  30 tablet  3  . metFORMIN (GLUCOPHAGE-XR) 750 MG 24 hr tablet Take 1 tablet (750 mg total) by mouth daily with breakfast.  60 tablet  3  . ONETOUCH DELICA LANCETS 23N MISC       . ONETOUCH VERIO test strip        No current facility-administered medications on file prior to visit.    Allergies  Allergen Reactions  . Penicillins Nausea And Vomiting    Family History  Problem Relation Age of Onset  . Diabetes Mother     and on mother's side of family  . Colon cancer Mother   . Stroke Mother   . Diabetes Father     and on father's side of family  . Diabetes Sister   . Diabetes  Brother   . Diabetes Sister     History   Social History  . Marital Status: Married    Spouse Name: N/A    Number of Children: N/A  . Years of Education: N/A   Occupational History  . [employer]--     retired   Social History Main Topics  . Smoking status: Never Smoker   . Smokeless tobacco: Never Used  . Alcohol Use: No  . Drug Use: No  . Sexual Activity: None   Other Topics Concern  . None   Social History Narrative   Exercise-- yard work,  Walking in [place] - See HPI.  All other ROS are negative.  BP 154/81  Pulse 95  Temp(Src) 98 F (36.7 C) (Oral)  Resp 16  Ht $R'5\' 4"'bJ$  (1.626 m)  Wt 114 lb 2 oz (51.767 kg)  BMI 19.58 kg/m2  SpO2 98%  Physical Exam  Vitals reviewed. Constitutional: She is oriented to person, place, and time and well-developed, well-nourished, and in no distress.  HENT:  Head: Normocephalic and atraumatic.  Right Ear: External ear normal.  Left Ear: External ear normal.  Nose: Nose normal.  Mouth/Throat: Oropharynx is clear and moist. No oropharyngeal exudate.  TM within  normal limits bilaterally.  Eyes: Conjunctivae are normal. Pupils are equal, round, and reactive to light.  Neck: Neck supple.  Cardiovascular: Normal rate, regular rhythm, normal heart sounds and intact distal pulses.   Pulmonary/Chest: Effort normal. No respiratory distress. She has no wheezes. She has rales. She exhibits no tenderness.  Lymphadenopathy:    She has no cervical adenopathy.  Neurological: She is alert and oriented to person, place, and time.  Skin: Skin is warm and dry. No rash noted.  Psychiatric: Affect normal.    Recent Results (from the past 2160 hour(s))  HEMOGLOBIN A1C     Status: Abnormal   Collection Time    05/23/14  4:29 PM      Result Value Ref Range   Hemoglobin A1C 7.2 (*) 4.6 - 6.5 %   Comment: Glycemic Control Guidelines for People with Diabetes:Non Diabetic:  <6%Goal of Therapy: <7%Additional Action Suggested:  >8%     BASIC METABOLIC PANEL     Status: Abnormal   Collection Time    06/24/14 10:05 AM      Result Value Ref Range   Sodium 135 (*) 137 - 147 mEq/L   Potassium 3.2 (*) 3.7 - 5.3 mEq/L   Chloride 92 (*) 96 - 112 mEq/L   CO2 27  19 - 32 mEq/L   Glucose, Bld 183 (*) 70 - 99 mg/dL   BUN 15  6 - 23 mg/dL   Creatinine, Ser 0.62  0.50 - 1.10 mg/dL   Calcium 9.5  8.4 - 10.5 mg/dL   GFR calc non Af Amer 88 (*) >90 mL/min   GFR calc Af Amer >90  >90 mL/min   Comment: (NOTE)     The eGFR has been calculated using the CKD EPI equation.     This calculation has not been validated in all clinical situations.     eGFR's persistently <90 mL/min signify possible Chronic Kidney     Disease.   Anion gap 16 (*) 5 - 15  CBC     Status: Abnormal   Collection Time    06/24/14 10:05 AM      Result Value Ref Range   WBC 13.7 (*) 4.0 - 10.5 K/uL   RBC 4.55  3.87 - 5.11 MIL/uL   Hemoglobin 13.7  12.0 - 15.0 g/dL   HCT 38.9  36.0 - 46.0 %   MCV 85.5  78.0 - 100.0 fL   MCH 30.1  26.0 - 34.0 pg   MCHC 35.2  30.0 - 36.0 g/dL   RDW 15.4  11.5 - 15.5 %   Platelets 632 (*) 150 - 400 K/uL  CULTURE, BLOOD (ROUTINE X 2)     Status: None   Collection Time    06/24/14 12:43 PM      Result Value Ref Range   Specimen Description BLOOD RIGHT ANTECUBITAL     Special Requests BOTTLES DRAWN AEROBIC AND ANAEROBIC 3CC     Culture  Setup Time       Value: 06/24/2014 19:20     Performed at [facility]   Culture       Value: NO GROWTH 5 DAYS     Performed at [facility]   Report Status 06/30/2014 FINAL    HIV ANTIBODY (ROUTINE TESTING)     Status: None   Collection Time    06/24/14 12:43 PM      Result Value Ref Range   HIV 1&2 Ab, 4th Generation NONREACTIVE  NONREACTIVE   Comment: (  NOTE)     A NONREACTIVE HIV Ag/Ab result does not exclude HIV infection since     the time frame for seroconversion is variable. If acute HIV infection     is suspected, a HIV-1 RNA Qualitative TMA test is recommended.      HIV-1/2 Antibody Diff         Not indicated.     HIV-1 RNA, Qual TMA           Not indicated.     PLEASE NOTE: This information has been disclosed to you from records     whose confidentiality may be protected by state law. If your state     requires such protection, then the state law prohibits you from making     any further disclosure of the information without the specific written     consent of the person to whom it pertains, or as otherwise permitted     by law. A general authorization for the release of medical or other     information is NOT sufficient for this purpose.     The performance of this assay has not been clinically validated in     patients less than 76 years old.     Performed at [place], BLOOD (ROUTINE X 2)     Status: None   Collection Time    06/24/14  1:00 PM      Result Value Ref Range   Specimen Description BLOOD RIGHT ANTECUBITAL     Special Requests BOTTLES DRAWN AEROBIC AND ANAEROBIC 4CC     Culture  Setup Time       Value: 06/24/2014 19:20     Performed at [facility]   Culture       Value: NO GROWTH 5 DAYS     Performed at [facility]   Report Status 06/30/2014 FINAL    GLUCOSE, CAPILLARY     Status: Abnormal   Collection Time    06/24/14  5:22 PM      Result Value Ref Range   Glucose-Capillary 167 (*) 70 - 99 mg/dL  LEGIONELLA ANTIGEN, URINE     Status: None   Collection Time    06/24/14  5:30 PM      Result Value Ref Range   Specimen Description URINE, CLEAN CATCH     Special Requests NONE     Legionella Antigen, Urine       Value: Negative for Legionella pneumophila serogroup 1                                                              Legionella pneumophila serogroup 1 antigen can be detected in urine within 2 to 3 days of infection and may persist even after treatment. This      assay does not detect other Legionella species or serogroups.     Performed at [facility]   Report Status  06/26/2014 FINAL    STREP PNEUMONIAE URINARY ANTIGEN     Status: None   Collection Time    06/24/14  5:30 PM      Result Value Ref Range   Strep Pneumo Urinary Antigen NEGATIVE  NEGATIVE   Comment:  Infection due to S. pneumoniae     cannot be absolutely ruled out     since the antigen present     may be below the detection limit     of the test.     Performed at [place], CAPILLARY     Status: None   Collection Time    06/24/14  9:48 PM      Result Value Ref Range   Glucose-Capillary 98  70 - 99 mg/dL   Comment 1 Notify RN    BASIC METABOLIC PANEL     Status: Abnormal   Collection Time    06/25/14  5:44 AM      Result Value Ref Range   Sodium 139  137 - 147 mEq/L   Potassium 4.6  3.7 - 5.3 mEq/L   Comment: DELTA CHECK NOTED   Chloride 102  96 - 112 mEq/L   Comment: DELTA CHECK NOTED   CO2 26  19 - 32 mEq/L   Glucose, Bld 106 (*) 70 - 99 mg/dL   BUN 13  6 - 23 mg/dL   Creatinine, Ser 0.57  0.50 - 1.10 mg/dL   Calcium 8.8  8.4 - 10.5 mg/dL   GFR calc non Af Amer >90  >90 mL/min   GFR calc Af Amer >90  >90 mL/min   Comment: (NOTE)     The eGFR has been calculated using the CKD EPI equation.     This calculation has not been validated in all clinical situations.     eGFR's persistently <90 mL/min signify possible Chronic Kidney     Disease.   Anion gap 11  5 - 15  CBC     Status: Abnormal   Collection Time    06/25/14  5:44 AM      Result Value Ref Range   WBC 12.2 (*) 4.0 - 10.5 K/uL   RBC 4.23  3.87 - 5.11 MIL/uL   Hemoglobin 13.2  12.0 - 15.0 g/dL   HCT 37.6  36.0 - 46.0 %   MCV 88.9  78.0 - 100.0 fL   MCH 31.2  26.0 - 34.0 pg   MCHC 35.1  30.0 - 36.0 g/dL   RDW 15.8 (*) 11.5 - 15.5 %   Platelets 650 (*) 150 - 400 K/uL  GLUCOSE, CAPILLARY     Status: Abnormal   Collection Time    06/25/14  7:29 AM      Result Value Ref Range   Glucose-Capillary 109 (*) 70 - 99 mg/dL   Comment 1 Notify RN    GLUCOSE, CAPILLARY     Status:  Abnormal   Collection Time    06/25/14 11:44 AM      Result Value Ref Range   Glucose-Capillary 166 (*) 70 - 99 mg/dL   Comment 1 Notify RN      Assessment/Plan: CAP (community acquired pneumonia) Vitals are good.  Patient symptomatic. Some crackling of R lung base that clears mostly with cough.  Given recent hospitalization, will Rx Azithromycin.  Increase fluids.  Rest.  Saline nasal spray.  Probiotic.  Follow-up 1 week for reassessment.  Thrombocytosis Most-likely reactive giving CAP.  Will obtain repeat CBC today.

## 2014-07-01 NOTE — Progress Notes (Signed)
Pre visit review using our clinic review tool, if applicable. No additional management support is needed unless otherwise documented below in the visit note/SLS  

## 2014-07-01 NOTE — Patient Instructions (Signed)
Please stop by lab for blood work. I will call you with your results.   Please take the Azithromycin and Zantac (Ranitidine) as directed.  Continue staying well-hydrated.  Follow-up in 1 week for repeat assessment.  Pneumonia Pneumonia is an infection of the lungs.  CAUSES Pneumonia may be caused by bacteria or a virus. Usually, these infections are caused by breathing infectious particles into the lungs (respiratory tract). SIGNS AND SYMPTOMS   Cough.  Fever.  Chest pain.  Increased rate of breathing.  Wheezing.  Mucus production. DIAGNOSIS  If you have the common symptoms of pneumonia, your health care provider will typically confirm the diagnosis with a chest X-ray. The X-ray will show an abnormality in the lung (pulmonary infiltrate) if you have pneumonia. Other tests of your blood, urine, or sputum may be done to find the specific cause of your pneumonia. Your health care provider may also do tests (blood gases or pulse oximetry) to see how well your lungs are working. TREATMENT  Some forms of pneumonia may be spread to other people when you cough or sneeze. You may be asked to wear a mask before and during your exam. Pneumonia that is caused by bacteria is treated with antibiotic medicine. Pneumonia that is caused by the influenza virus may be treated with an antiviral medicine. Most other viral infections must run their course. These infections will not respond to antibiotics.  HOME CARE INSTRUCTIONS   Cough suppressants may be used if you are losing too much rest. However, coughing protects you by clearing your lungs. You should avoid using cough suppressants if you can.  Your health care provider may have prescribed medicine if he or she thinks your pneumonia is caused by bacteria or influenza. Finish your medicine even if you start to feel better.  Your health care provider may also prescribe an expectorant. This loosens the mucus to be coughed up.  Take medicines only as  directed by your health care provider.  Do not smoke. Smoking is a common cause of bronchitis and can contribute to pneumonia. If you are a smoker and continue to smoke, your cough may last several weeks after your pneumonia has cleared.  A cold steam vaporizer or humidifier in your room or home may help loosen mucus.  Coughing is often worse at night. Sleeping in a semi-upright position in a recliner or using a couple pillows under your head will help with this.  Get rest as you feel it is needed. Your body will usually let you know when you need to rest. PREVENTION A pneumococcal shot (vaccine) is available to prevent a common bacterial cause of pneumonia. This is usually suggested for:  People over 31 years old.  Patients on chemotherapy.  People with chronic lung problems, such as bronchitis or emphysema.  People with immune system problems. If you are over 65 or have a high risk condition, you may receive the pneumococcal vaccine if you have not received it before. In some countries, a routine influenza vaccine is also recommended. This vaccine can help prevent some cases of pneumonia.You may be offered the influenza vaccine as part of your care. If you smoke, it is time to quit. You may receive instructions on how to stop smoking. Your health care provider can provide medicines and counseling to help you quit. SEEK MEDICAL CARE IF: You have a fever. SEEK IMMEDIATE MEDICAL CARE IF:   Your illness becomes worse. This is especially true if you are elderly or weakened from any  other disease.  You cannot control your cough with suppressants and are losing sleep.  You begin coughing up blood.  You develop pain which is getting worse or is uncontrolled with medicines.  Any of the symptoms which initially brought you in for treatment are getting worse rather than better.  You develop shortness of breath or chest pain. MAKE SURE YOU:   Understand these instructions.  Will watch  your condition.  Will get help right away if you are not doing well or get worse. Document Released: 08/19/2005 Document Revised: 01/03/2014 Document Reviewed: 11/08/2010 [facility] Patient Information 2015 [place], [place]. This information is not intended to replace advice given to you by your health care provider. Make sure you discuss any questions you have with your health care provider.

## 2014-07-01 NOTE — Assessment & Plan Note (Signed)
Most-likely reactive giving CAP.  Will obtain repeat CBC today.

## 2014-07-01 NOTE — Assessment & Plan Note (Signed)
Vitals are good.  Patient symptomatic. Some crackling of R lung base that clears mostly with cough.  Given recent hospitalization, will Rx Azithromycin.  Increase fluids.  Rest.  Saline nasal spray.  Probiotic.  Follow-up 1 week for reassessment.

## 2014-07-04 ENCOUNTER — Encounter: Payer: Self-pay | Admitting: Internal Medicine

## 2014-07-04 ENCOUNTER — Ambulatory Visit (INDEPENDENT_AMBULATORY_CARE_PROVIDER_SITE_OTHER): Payer: Medicare Other | Admitting: Internal Medicine

## 2014-07-04 VITALS — BP 118/64 | HR 94 | Temp 98.1°F | Resp 12 | Wt 115.0 lb

## 2014-07-04 DIAGNOSIS — E119 Type 2 diabetes mellitus without complications: Secondary | ICD-10-CM

## 2014-07-04 DIAGNOSIS — Z8639 Personal history of other endocrine, nutritional and metabolic disease: Secondary | ICD-10-CM

## 2014-07-04 NOTE — Patient Instructions (Signed)
Please continue: - Glipizide 5 mg 2x a day - Metformin extended release 750 mg once a day in am Move Tradjenta 5 mg daily in am Please return in 1.5 month with your sugar log.

## 2014-07-04 NOTE — Progress Notes (Signed)
Patient ID: AHMIRACLE UPCHURCH, female   DOB: [birth year], 73 y.o.   MRN: [id number]  HPI: Brandi Mccormick is a 73 y.o.-year-old female, returning for f/u for DM2, dx 2013, non-insulin-dependent, uncontrolled, without complications and h/o hyperthyroidism. Last visit 1.5 mo ago.   She was in the hospital with PNA x2 days - admitted 06/24/2014. Feeling better.  She has a h/o hyperthyroidism, tx with meds ~20 years ago. No other history available.   Recent labs reviewed - normal now: Lab Results  Component Value Date   TSH 0.44 02/17/2014   TSH 0.28* 12/20/2013   FREET4 1.00 02/17/2014   FREET4 0.97 12/20/2013   DM2:  Last hemoglobin A1c was: Lab Results  Component Value Date   HGBA1C 7.2* 05/23/2014   HGBA1C 8.1* 02/17/2014   HGBA1C 11.9* 11/16/2013   Pt is on a regimen of: - Metformin ER 750 mg po in am >> did not understand to take it bid - Glipizide 5 mg bid - Tradjenta 5 mg daily in am >> did not understand to take it in am   Pt now checks her sugars - 2x a day and they are much improved: - am: n/c >> 170-230 >> 95-194 >> 163-247 >> 97-115 - 2h after b'fast: n/c >> 144,160-280 >> 151, 157, 170 >> n/c - before lunch: n/c >> 178, 184 >> 187 >> n/c - 2h after lunch: n/c >>  226, 244, 354 >> 105, 173 >> 191-270 >> 117-120 - before dinner: n/c >> 161, 174, 182, 211 >> n/c >> 70, 101, 149 >> n/c - 2h after dinner: n/c >> 174-343 >> 153, 237, 240, 289 >> 158-225 >> n/c - bedtime: n/c >> 182-280 >> 86, 111, 142, 195 >> 285, 313 >> 117-120 - nighttime: n/c No lows; ? If she  she has hypoglycemia awareness. Highest sugar was  289 x1 >> 313 x1 >> <150  Meter: OneTouch Verio.  Pt's meals are: - Breakfast: egg, coffee, juice; sometime - Lunch: No Lunch - Dinner: meat + vegetables - Snacks: 1-2  - no CKD, last BUN/creatinine:  Lab Results  Component Value Date   BUN 14 07/01/2014   CREATININE 0.7 07/01/2014  ACR 8 on 11/16/2013. - last set of lipids: Lab Results  Component  Value Date   CHOL 205* 02/17/2014   HDL 61.70 02/17/2014   LDLCALC 125* 02/17/2014   TRIG 90.0 02/17/2014   CHOLHDL 3 02/17/2014  on Simvastatin. - last eye exam was in 12/06/2013. No DR.  - no numbness and tingling in her feet.   ROS: Constitutional: no weight gain/loss, no fatigue, no subjective hyperthermia/hypothermia Eyes: no blurry vision, no xerophthalmia ENT: no sore throat, no nodules palpated in throat, no dysphagia/odynophagia, no hoarseness Cardiovascular: no CP/SOB/palpitations/leg swelling Respiratory: no cough/SOB Gastrointestinal: no N/V/D/no C Musculoskeletal: no muscle/joint aches Skin: no rashes Neurological: no tremors/numbness/tingling/dizziness  I reviewed pt's medications, allergies, PMH, social hx, family hx and no changes required, except as mentioned above.  PE: BP 118/64 mmHg  Pulse 94  Temp(Src) 98.1 F (36.7 C) (Oral)  Resp 12  Wt 115 lb (52.164 kg)  SpO2 95% Wt Readings from Last 3 Encounters:  07/04/14 115 lb (52.164 kg)  07/01/14 114 lb 2 oz (51.767 kg)  06/24/14 119 lb (53.978 kg)   Constitutional: thin, in NAD Eyes: PERRLA, EOMI, no exophthalmos ENT: moist mucous membranes, no thyromegaly - but full R thyroid lobe, no cervical lymphadenopathy Cardiovascular: tachycardia, RR, No MRG Respiratory: CTA B Gastrointestinal: abdomen soft, NT, ND,  BS+ Musculoskeletal: no deformities, strength intact in all 4 Skin: moist, warm, no rashes Neurological: no tremor with outstretched hands, DTR normal in all 4  ASSESSMENT: 1. DM2, non-insulin-dependent, uncontrolled, without complications - new dx  2. H/o Hyperthyroidism - 20 years ago - was on meds, cannot remember name  PLAN:  1. Patient with recently dx'ed diabetes, on oral antidiabetic regimen, now worsened since last visit. She is getting diarrhea from Metformin. - I suggested to:  Patient Instructions  Please continue: - Glipizide 5 mg 2x a day - Metformin extended release 750 mg  once a day in am - Move Tradjenta 5 mg daily in am Please return in 1.5 month with your sugar log.  - continue checking sugars at different times of the day - check 2 times a day, rotating checks - she is up to date with yearly eye exams - will check HbA1c at next visit - Return to clinic in 1.5 mo with sugar log   2. H/o hyperthyroidism - appears euthyroid - last TFTs normal at last visit - may need a thyroid U/S to investigate the R thyroid fullness - for now we will follow this clinically. She is asymptomatic.

## 2014-07-08 ENCOUNTER — Encounter: Payer: Self-pay | Admitting: Physician Assistant

## 2014-07-08 ENCOUNTER — Ambulatory Visit (INDEPENDENT_AMBULATORY_CARE_PROVIDER_SITE_OTHER): Payer: Medicare Other | Admitting: Physician Assistant

## 2014-07-08 VITALS — BP 148/78 | HR 85 | Temp 98.0°F | Resp 16 | Ht 64.0 in | Wt 116.0 lb

## 2014-07-08 DIAGNOSIS — D75839 Thrombocytosis, unspecified: Secondary | ICD-10-CM

## 2014-07-08 DIAGNOSIS — J189 Pneumonia, unspecified organism: Secondary | ICD-10-CM

## 2014-07-08 DIAGNOSIS — D473 Essential (hemorrhagic) thrombocythemia: Secondary | ICD-10-CM

## 2014-07-08 LAB — CBC
HCT: 38.7 % (ref 36.0–46.0)
Hemoglobin: 12.7 g/dL (ref 12.0–15.0)
MCHC: 32.8 g/dL (ref 30.0–36.0)
MCV: 89.8 fl (ref 78.0–100.0)
Platelets: 576 10*3/uL — ABNORMAL HIGH (ref 150.0–400.0)
RBC: 4.31 Mil/uL (ref 3.87–5.11)
RDW: 17.3 % — ABNORMAL HIGH (ref 11.5–15.5)
WBC: 7.2 10*3/uL (ref 4.0–10.5)

## 2014-07-08 NOTE — Assessment & Plan Note (Signed)
Resolved.  Asymptomatic.  O2 saturation 100%.  Will check repeat CBC to ensure resolution of leukocytosis.

## 2014-07-08 NOTE — Progress Notes (Signed)
Pre visit review using our clinic review tool, if applicable. No additional management support is needed unless otherwise documented below in the visit note/SLS  

## 2014-07-08 NOTE — Progress Notes (Signed)
Patient presents to clinic today for follow-up of CAP. Patient was seen one week ago for hospital follow-up of CAP s/p treatment with Levaquin.  Some symptoms persisted and a Z-pack was prescribed.  Patient endorses taking medication as directed.  Endorses resolution of symptoms.  Denies cough, congestion, SOB or wheezing.  Needs repeat CBC to ensure resolution of reactive thrombocytosis.  Past Medical History  Diagnosis Date  . Diabetes   . Thyroid disease     Not on med's  . Shingles     Current Outpatient Prescriptions on File Prior to Visit  Medication Sig Dispense Refill  . atorvastatin (LIPITOR) 20 MG tablet Take 20 mg by mouth daily.    Marland Kitchen glipiZIDE (GLUCOTROL) 5 MG tablet Take 1 tablet (5 mg total) by mouth 2 (two) times daily before a meal. 180 tablet 1  . linagliptin (TRADJENTA) 5 MG TABS tablet Take 1 tablet (5 mg total) by mouth daily. 30 tablet 3  . ONETOUCH DELICA LANCETS 98J MISC     . ONETOUCH VERIO test strip     . ranitidine (ZANTAC) 150 MG tablet Take 1 tablet (150 mg total) by mouth at bedtime. 30 tablet 1  . metFORMIN (GLUCOPHAGE-XR) 750 MG 24 hr tablet Take 1 tablet (750 mg total) by mouth daily with breakfast. 60 tablet 3   No current facility-administered medications on file prior to visit.    Allergies  Allergen Reactions  . Penicillins Nausea And Vomiting    Family History  Problem Relation Age of Onset  . Diabetes Mother     and on mother's side of family  . Colon cancer Mother   . Stroke Mother   . Diabetes Father     and on father's side of family  . Diabetes Sister   . Diabetes Brother   . Diabetes Sister     History   Social History  . Marital Status: Married    Spouse Name: N/A    Number of Children: N/A  . Years of Education: N/A   Occupational History  . [employer]--     retired   Social History Main Topics  . Smoking status: Never Smoker   . Smokeless tobacco: Never Used  . Alcohol Use: No  . Drug Use: No  . Sexual Activity:  None   Other Topics Concern  . None   Social History Narrative   Exercise-- yard work,  Walking in [place] - See HPI.  All other ROS are negative.  BP 148/78 mmHg  Pulse 85  Temp(Src) 98 F (36.7 C) (Oral)  Resp 16  Ht 5' 4" (1.626 m)  Wt 116 lb (52.617 kg)  BMI 19.90 kg/m2  SpO2 100%  Physical Exam  Constitutional: She is oriented to person, place, and time and well-developed, well-nourished, and in no distress.  HENT:  Head: Normocephalic and atraumatic.  Cardiovascular: Normal rate, regular rhythm, normal heart sounds and intact distal pulses.   Pulmonary/Chest: Effort normal and breath sounds normal. No respiratory distress. She has no wheezes. She has no rales. She exhibits no tenderness.  Neurological: She is alert and oriented to person, place, and time.  Skin: Skin is warm and dry. No rash noted.  Psychiatric: Affect normal.  Vitals reviewed.   Recent Results (from the past 2160 hour(s))  HgB A1c     Status: Abnormal   Collection Time: 05/23/14  4:29 PM  Result Value Ref Range   Hgb A1c MFr Bld 7.2 (H) 4.6 -  6.5 %    Comment: Glycemic Control Guidelines for People with Diabetes:Non Diabetic:  <6%Goal of Therapy: <7%Additional Action Suggested:  >2%   Basic metabolic panel    (if pt has PMH of COPD)     Status: Abnormal   Collection Time: 06/24/14 10:05 AM  Result Value Ref Range   Sodium 135 (L) 137 - 147 mEq/L   Potassium 3.2 (L) 3.7 - 5.3 mEq/L   Chloride 92 (L) 96 - 112 mEq/L   CO2 27 19 - 32 mEq/L   Glucose, Bld 183 (H) 70 - 99 mg/dL   BUN 15 6 - 23 mg/dL   Creatinine, Ser 0.62 0.50 - 1.10 mg/dL   Calcium 9.5 8.4 - 10.5 mg/dL   GFR calc non Af Amer 88 (L) >90 mL/min   GFR calc Af Amer >90 >90 mL/min    Comment: (NOTE) The eGFR has been calculated using the CKD EPI equation. This calculation has not been validated in all clinical situations. eGFR's persistently <90 mL/min signify possible Chronic Kidney Disease.   Anion gap 16 (H)  5 - 15  CBC     (if pt has PMH of COPD)     Status: Abnormal   Collection Time: 06/24/14 10:05 AM  Result Value Ref Range   WBC 13.7 (H) 4.0 - 10.5 K/uL   RBC 4.55 3.87 - 5.11 MIL/uL   Hemoglobin 13.7 12.0 - 15.0 g/dL   HCT 38.9 36.0 - 46.0 %   MCV 85.5 78.0 - 100.0 fL   MCH 30.1 26.0 - 34.0 pg   MCHC 35.2 30.0 - 36.0 g/dL   RDW 15.4 11.5 - 15.5 %   Platelets 632 (H) 150 - 400 K/uL  Culture, blood (routine x 2) Call MD if unable to obtain prior to antibiotics being given     Status: None   Collection Time: 06/24/14 12:43 PM  Result Value Ref Range   Specimen Description BLOOD RIGHT ANTECUBITAL    Special Requests BOTTLES DRAWN AEROBIC AND ANAEROBIC 3CC    Culture  Setup Time      06/24/2014 19:20 Performed at [facility] 5 DAYS Performed at [facility]   Report Status 06/30/2014 FINAL   HIV antibody     Status: None   Collection Time: 06/24/14 12:43 PM  Result Value Ref Range   HIV 1&2 Ab, 4th Generation NONREACTIVE NONREACTIVE    Comment: (NOTE) A NONREACTIVE HIV Ag/Ab result does not exclude HIV infection since the time frame for seroconversion is variable. If acute HIV infection is suspected, a HIV-1 RNA Qualitative TMA test is recommended. HIV-1/2 Antibody Diff         Not indicated. HIV-1 RNA, Qual TMA           Not indicated. PLEASE NOTE: This information has been disclosed to you from records whose confidentiality may be protected by state law. If your state requires such protection, then the state law prohibits you from making any further disclosure of the information without the specific written consent of the person to whom it pertains, or as otherwise permitted by law. A general authorization for the release of medical or other information is NOT sufficient for this purpose. The performance of this assay has not been clinically validated in patients less than 49 years old. Performed at [facility],  blood (routine x 2) Call MD if unable to obtain prior to antibiotics being given  Status: None   Collection Time: 06/24/14  1:00 PM  Result Value Ref Range   Specimen Description BLOOD RIGHT ANTECUBITAL    Special Requests BOTTLES DRAWN AEROBIC AND ANAEROBIC 4CC    Culture  Setup Time      06/24/2014 19:20 Performed at [facility]   Culture      NO GROWTH 5 DAYS Performed at [facility]   Report Status 06/30/2014 FINAL   Glucose, capillary     Status: Abnormal   Collection Time: 06/24/14  5:22 PM  Result Value Ref Range   Glucose-Capillary 167 (H) 70 - 99 mg/dL  Legionella antigen, urine     Status: None   Collection Time: 06/24/14  5:30 PM  Result Value Ref Range   Specimen Description URINE, CLEAN CATCH    Special Requests NONE    Legionella Antigen, Urine      Negative for Legionella pneumophila serogroup 1                                                              Legionella pneumophila serogroup 1 antigen can be detected in urine within 2 to 3 days of infection and may persist even after treatment. This  assay does not detect other Legionella species or serogroups. Performed at [facility]   Report Status 06/26/2014 FINAL   Strep pneumoniae urinary antigen     Status: None   Collection Time: 06/24/14  5:30 PM  Result Value Ref Range   Strep Pneumo Urinary Antigen NEGATIVE NEGATIVE    Comment:        Infection due to S. pneumoniae cannot be absolutely ruled out since the antigen present may be below the detection limit of the test. Performed at [facility]  Glucose, capillary     Status: None   Collection Time: 06/24/14  9:48 PM  Result Value Ref Range   Glucose-Capillary 98 70 - 99 mg/dL   Comment 1 Notify RN   Basic metabolic panel     Status: Abnormal   Collection Time: 06/25/14  5:44 AM  Result Value Ref Range   Sodium 139 137 - 147 mEq/L   Potassium 4.6 3.7 - 5.3 mEq/L    Comment: DELTA CHECK NOTED   Chloride 102 96 -  112 mEq/L    Comment: DELTA CHECK NOTED   CO2 26 19 - 32 mEq/L   Glucose, Bld 106 (H) 70 - 99 mg/dL   BUN 13 6 - 23 mg/dL   Creatinine, Ser 0.57 0.50 - 1.10 mg/dL   Calcium 8.8 8.4 - 10.5 mg/dL   GFR calc non Af Amer >90 >90 mL/min   GFR calc Af Amer >90 >90 mL/min    Comment: (NOTE) The eGFR has been calculated using the CKD EPI equation. This calculation has not been validated in all clinical situations. eGFR's persistently <90 mL/min signify possible Chronic Kidney Disease.   Anion gap 11 5 - 15  CBC     Status: Abnormal   Collection Time: 06/25/14  5:44 AM  Result Value Ref Range   WBC 12.2 (H) 4.0 - 10.5 K/uL   RBC 4.23 3.87 - 5.11 MIL/uL   Hemoglobin 13.2 12.0 - 15.0 g/dL   HCT 37.6 36.0 - 46.0 %   MCV 88.9 78.0 -  100.0 fL   MCH 31.2 26.0 - 34.0 pg   MCHC 35.1 30.0 - 36.0 g/dL   RDW 15.8 (H) 11.5 - 15.5 %   Platelets 650 (H) 150 - 400 K/uL  Glucose, capillary     Status: Abnormal   Collection Time: 06/25/14  7:29 AM  Result Value Ref Range   Glucose-Capillary 109 (H) 70 - 99 mg/dL   Comment 1 Notify RN   Glucose, capillary     Status: Abnormal   Collection Time: 06/25/14 11:44 AM  Result Value Ref Range   Glucose-Capillary 166 (H) 70 - 99 mg/dL   Comment 1 Notify RN   CBC     Status: Abnormal   Collection Time: 07/01/14 11:57 AM  Result Value Ref Range   WBC 12.3 (H) 4.0 - 10.5 K/uL   RBC 4.66 3.87 - 5.11 Mil/uL   Platelets 834.0 (H) 150.0 - 400.0 K/uL   Hemoglobin 14.3 12.0 - 15.0 g/dL   HCT 42.4 36.0 - 46.0 %   MCV 90.9 78.0 - 100.0 fl   MCHC 33.8 30.0 - 36.0 g/dL   RDW 17.3 (H) 11.5 - 18.2 %  Basic Metabolic Panel (BMET)     Status: None   Collection Time: 07/01/14 11:57 AM  Result Value Ref Range   Sodium 141 135 - 145 mEq/L   Potassium 4.4 3.5 - 5.1 mEq/L   Chloride 101 96 - 112 mEq/L   CO2 27 19 - 32 mEq/L   Glucose, Bld 71 70 - 99 mg/dL   BUN 14 6 - 23 mg/dL   Creatinine, Ser 0.7 0.4 - 1.2 mg/dL   Calcium 10.1 8.4 - 10.5 mg/dL   GFR 83.12 >60.00  mL/min    Assessment/Plan: CAP (community acquired pneumonia) Resolved.  Asymptomatic.  O2 saturation 100%.  Will check repeat CBC to ensure resolution of leukocytosis.  Thrombocytosis Reactive.  Infection has resolved clinically. Will check repeat platelet count today.

## 2014-07-08 NOTE — Patient Instructions (Signed)
Please continue good fluid intake and rest.  I am glad you are feeling better.  Please stop by the lab for repeat blood work.  I will call you with your results.  Follow-up will be base don results.

## 2014-07-08 NOTE — Assessment & Plan Note (Signed)
Reactive.  Infection has resolved clinically. Will check repeat platelet count today.

## 2014-07-24 DIAGNOSIS — K219 Gastro-esophageal reflux disease without esophagitis: Secondary | ICD-10-CM

## 2014-07-24 DIAGNOSIS — D473 Essential (hemorrhagic) thrombocythemia: Secondary | ICD-10-CM

## 2014-07-26 ENCOUNTER — Other Ambulatory Visit: Payer: Self-pay

## 2014-07-26 MED ORDER — ATORVASTATIN CALCIUM 20 MG PO TABS: 20.0000 mg | ORAL_TABLET | Freq: Every day | ORAL | Status: DC

## 2014-08-15 ENCOUNTER — Encounter: Payer: Self-pay | Admitting: Internal Medicine

## 2014-08-15 ENCOUNTER — Ambulatory Visit (INDEPENDENT_AMBULATORY_CARE_PROVIDER_SITE_OTHER): Payer: Medicare Other | Admitting: Internal Medicine

## 2014-08-15 VITALS — BP 118/78 | HR 86 | Temp 97.9°F | Resp 12 | Wt 118.0 lb

## 2014-08-15 DIAGNOSIS — E119 Type 2 diabetes mellitus without complications: Secondary | ICD-10-CM

## 2014-08-15 DIAGNOSIS — Z8639 Personal history of other endocrine, nutritional and metabolic disease: Secondary | ICD-10-CM

## 2014-08-15 MED ORDER — GLIPIZIDE ER 5 MG PO TB24: 5.0000 mg | ORAL_TABLET | Freq: Every day | ORAL | Status: DC

## 2014-08-15 NOTE — Patient Instructions (Signed)
Please continue: - Metformin ER 750 mg in am Stop: - Glipizide  Start: - Tradjenta 5 mg in am - Glipizide XL 5 mg in am  Please return in 1.5 month with your sugar log.   Please stop at the lab.

## 2014-08-15 NOTE — Progress Notes (Signed)
Patient ID: Brandi Mccormick, female   DOB: November 03, 1940, 73 y.o.   MRN: [id number]  HPI: Brandi Mccormick is a 73 y.o.-year-old female, returning for f/u for DM2, dx 2013, non-insulin-dependent, uncontrolled, without complications and h/o hyperthyroidism. Last visit 1.5 mo ago.   She has a h/o hyperthyroidism, tx with meds ~20 years ago. No other history available.   She is asymptomatic. Denies palpitations, tremors, heat intolerance, weight loss, hyperdefecation.  Recent labs reviewed - normal at last check: Lab Results  Component Value Date   TSH 0.44 02/17/2014   TSH 0.28* 12/20/2013   FREET4 1.00 02/17/2014   FREET4 0.97 12/20/2013   DM2:  Last hemoglobin A1c greatly improved in last year: Lab Results  Component Value Date   HGBA1C 7.2* 05/23/2014   HGBA1C 8.1* 02/17/2014   HGBA1C 11.9* 11/16/2013   Pt is on a regimen of: - Metformin ER 750 mg po in am - Glipizide 5 mg bid She stoppedTradjenta 5 mg daily in am >> did not understand to continue...  Pt now checks her sugars - 2x a day and they are worse >> stress, eating more, stopped Tradjenta: - am: n/c >> 170-230 >> 95-194 >> 163-247 >> 97-115 >> 113-176 - 2h after b'fast: n/c >> 144,160-280 >> 151, 157, 170 >> n/c >> 93-175, 214 - before lunch: n/c >> 178, 184 >> 187 >> n/c >> 76-92 - 2h after lunch: n/c >>  226, 244, 354 >> 105, 173 >> 191-270 >> 117-120 >> n/c - before dinner: n/c >> 161, 174, 182, 211 >> n/c >> 70, 101, 149 >> n/c >> 108-174 - 2h after dinner: n/c >> 174-343 >> 153, 237, 240, 289 >> 158-225 >> n/c >> 99-196, 271x1 - bedtime: n/c >> 182-280 >> 86, 111, 142, 195 >> 285, 313 >> 117-120 >> 70, 111-190 - nighttime: n/c No lows; ? If she  she has hypoglycemia awareness. Highest sugar was  289 x1 >> 313 x1 >> <150 >> 271x1  Meter: OneTouch Verio.  Pt's meals are: - Breakfast: egg, coffee, juice; sometime - Lunch: No Lunch - Dinner: meat + vegetables - Snacks: 1-2  - no CKD, last BUN/creatinine:  Lab  Results  Component Value Date   BUN 14 07/01/2014   CREATININE 0.7 07/01/2014  ACR 8 on 11/16/2013. - last set of lipids: Lab Results  Component Value Date   CHOL 205* 02/17/2014   HDL 61.70 02/17/2014   LDLCALC 125* 02/17/2014   TRIG 90.0 02/17/2014   CHOLHDL 3 02/17/2014  on Simvastatin. - last eye exam was in 12/06/2013. No DR.  - no numbness and tingling in her feet.  ROS: Constitutional: no weight gain/loss, no fatigue, no subjective hyperthermia/hypothermia Eyes: no blurry vision, no xerophthalmia ENT: no sore throat, no nodules palpated in throat, no dysphagia/odynophagia, no hoarseness Cardiovascular: no CP/SOB/palpitations/leg swelling Respiratory: no cough/SOB Gastrointestinal: no N/V/D/no C Musculoskeletal: no muscle/joint aches Skin: no rashes Neurological: no tremors/numbness/tingling/dizziness  I reviewed pt's medications, allergies, PMH, social hx, family hx, and changes were documented in the history of present illness. Otherwise, unchanged from my last visit note.  PE: BP 118/78 mmHg  Pulse 86  Temp(Src) 97.9 F (36.6 C) (Oral)  Resp 12  Wt 118 lb (53.524 kg)  SpO2 99% Wt Readings from Last 3 Encounters:  08/15/14 118 lb (53.524 kg)  07/08/14 116 lb (52.617 kg)  07/04/14 115 lb (52.164 kg)   Constitutional: thin, in NAD Eyes: PERRLA, EOMI, no exophthalmos ENT: moist mucous membranes, no thyromegaly -  but full R thyroid lobe, no cervical lymphadenopathy Cardiovascular: tachycardia, RR, No MRG Respiratory: CTA B Gastrointestinal: abdomen soft, NT, ND, BS+ Musculoskeletal: no deformities, strength intact in all 4 Skin: moist, warm, no rashes Neurological: no tremor with outstretched hands, DTR normal in all 4  ASSESSMENT: 1. DM2, non-insulin-dependent, uncontrolled, without complications - new dx  2. H/o Hyperthyroidism - 20 years ago - was on meds, cannot remember name  PLAN:  1. Patient with fairly recently dx'ed diabetes, on oral  antidiabetic regimen, now worsened since last visit. She was getting diarrhea from Metformin >> now Metformin Xt 750 mg in am. She accidentally stopped Tradjenta >> needs to restart. Since she has lows at lunchtime >> will switch from regular Glipizide to Glipizide XL and decrease the dose to 5 mg daily. - I suggested to:  Patient Instructions  Please continue: - Metformin ER 750 mg in am Stop: - Glipizide  Start: - Tradjenta 5 mg in am - Glipizide XL 5 mg in am  Please return in 1.5 month with your sugar log.   Please stop at the lab.  - continue checking sugars at different times of the day - check 2 times a day, rotating checks - she is up to date with yearly eye exams - refuses flu vaccine - will check HbA1c at next visit (not yet 3 mo since previous) - Return to clinic in 1.5 mo with sugar log   2. H/o hyperthyroidism - appears euthyroid - last TFTs normal at last visit >> will repeat today - may need a thyroid U/S to investigate the R thyroid fullness - for now we will follow this clinically. She is asymptomatic.  Office Visit on 08/15/2014  Component Date Value Ref Range Status  . TSH 08/15/2014 0.93  0.35 - 4.50 uIU/mL Final  . Free T4 08/15/2014 0.91  0.60 - 1.60 ng/dL Final  . T3, Free 08/15/2014 3.2  2.3 - 4.2 pg/mL Final   TFTs normal.

## 2014-08-16 LAB — T3, FREE: T3, Free: 3.2 pg/mL (ref 2.3–4.2)

## 2014-08-16 LAB — T4, FREE: Free T4: 0.91 ng/dL (ref 0.60–1.60)

## 2014-08-16 LAB — TSH: TSH: 0.93 u[IU]/mL (ref 0.35–4.50)

## 2014-09-26 ENCOUNTER — Ambulatory Visit (INDEPENDENT_AMBULATORY_CARE_PROVIDER_SITE_OTHER): Payer: Medicare HMO | Admitting: Internal Medicine

## 2014-09-26 ENCOUNTER — Encounter: Payer: Self-pay | Admitting: Internal Medicine

## 2014-09-26 VITALS — BP 120/80 | HR 79 | Temp 97.9°F | Resp 12 | Wt 123.8 lb

## 2014-09-26 DIAGNOSIS — E119 Type 2 diabetes mellitus without complications: Secondary | ICD-10-CM

## 2014-09-26 DIAGNOSIS — Z8639 Personal history of other endocrine, nutritional and metabolic disease: Secondary | ICD-10-CM

## 2014-09-26 LAB — HEMOGLOBIN A1C: Hgb A1c MFr Bld: 6.7 % — ABNORMAL HIGH (ref 4.6–6.5)

## 2014-09-26 MED ORDER — LINAGLIPTIN 5 MG PO TABS: 5.0000 mg | ORAL_TABLET | Freq: Every day | ORAL | Status: DC

## 2014-09-26 MED ORDER — METFORMIN HCL ER 750 MG PO TB24: 750.0000 mg | ORAL_TABLET | Freq: Every day | ORAL | Status: DC

## 2014-09-26 NOTE — Patient Instructions (Addendum)
Please continue: - Metformin ER 750 mg in am - Glipizide XL 5 mg in am  Restart: - Tradjenta 5 mg in am  Please return in 3 month with your sugar log.   Please stop at the lab.

## 2014-09-26 NOTE — Progress Notes (Signed)
Patient ID: Brandi Mccormick, female   DOB: 09/11/1940, 74 y.o.   MRN: [id number]  HPI: Brandi Mccormick is a 74 y.o.-year-old female, returning for f/u for DM2, dx 2013, non-insulin-dependent, uncontrolled, without complications and h/o hyperthyroidism. Last visit 1.5 mo ago.   She has a h/o hyperthyroidism, tx with meds ~20 years ago. No other history available.   She is asymptomatic. Denies palpitations, tremors, heat intolerance, weight loss, hyperdefecation.  Recent labs reviewed - normal at last check: Lab Results  Component Value Date   TSH 0.93 08/15/2014   TSH 0.44 02/17/2014   TSH 0.28* 12/20/2013   FREET4 0.91 08/15/2014   FREET4 1.00 02/17/2014   FREET4 0.97 12/20/2013   DM2:  Last hemoglobin A1c greatly improved in last year: Lab Results  Component Value Date   HGBA1C 7.2* 05/23/2014   HGBA1C 8.1* 02/17/2014   HGBA1C 11.9* 11/16/2013   Pt is on a regimen of: - Metformin ER 750 mg po in am - Glipizide XL 5 mg in am - Tradjenta 5 mg daily in am - restarted at last visit >> did not get it from the pharmacy!  Pt now checks her sugars - 2x a day and they are ~same as before: - am: n/c >> 170-230 >> 95-194 >> 163-247 >> 97-115 >> 113-176 >> 94-176, 210x1 - 2h after b'fast: n/c >> 144,160-280 >> 151, 157, 170 >> n/c >> 93-175, 214 >> 175, 181 - before lunch: n/c >> 178, 184 >> 187 >> n/c >> 76-92 >> 79 - 2h after lunch: n/c >>  226, 244, 354 >> 105, 173 >> 191-270 >> 117-120 >> n/c >> 126, 172 - before dinner: n/c >> 161, 174, 182, 211 >> n/c >> 70, 101, 149 >> n/c >> 108-174 >> 108-177 - 2h after dinner: n/c >> 174-343 >> 153, 237, 240, 289 >> 158-225 >> n/c >> 99-196, 271x1 >> 148, 168 - bedtime: n/c >> 182-280 >> 86, 111, 142, 195 >> 285, 313 >> 117-120 >> 70, 111-190 >> 117-173, 205 x1 - nighttime: n/c No lows; had 1 CBG at 79 >> ? If she  she has hypoglycemia awareness. Highest sugar was  289 x1 >> 313 x1 >> <150 >> 271x1 >> 201x1  Meter: OneTouch Verio.  Pt's  meals are: - Breakfast: egg, coffee, juice; sometime - Lunch: No Lunch - Dinner: meat + vegetables - Snacks: 1-2  - no CKD, last BUN/creatinine:  Lab Results  Component Value Date   BUN 14 07/01/2014   CREATININE 0.7 07/01/2014  ACR 8 on 11/16/2013. - last set of lipids: Lab Results  Component Value Date   CHOL 205* 02/17/2014   HDL 61.70 02/17/2014   LDLCALC 125* 02/17/2014   TRIG 90.0 02/17/2014   CHOLHDL 3 02/17/2014  on Simvastatin. - last eye exam was in 12/06/2013. No DR.  - no numbness and tingling in her feet.  ROS: Constitutional: no weight gain/loss, no fatigue, no subjective hyperthermia/hypothermia Eyes: no blurry vision, no xerophthalmia ENT: no sore throat, no nodules palpated in throat, no dysphagia/odynophagia, no hoarseness Cardiovascular: no CP/SOB/palpitations/leg swelling Respiratory: no cough/SOB Gastrointestinal: no N/V/D/no C Musculoskeletal: no muscle/joint aches Skin: no rashes Neurological: no tremors/numbness/tingling/dizziness  I reviewed pt's medications, allergies, PMH, social hx, family hx, and changes were documented in the history of present illness. Otherwise, unchanged from my initial visit note.  PE: BP 120/80 mmHg  Pulse 79  Temp(Src) 97.9 F (36.6 C) (Oral)  Resp 12  Wt 123 lb 12.8 oz (56.155 kg)  SpO2 97% Wt Readings from Last 3 Encounters:  09/26/14 123 lb 12.8 oz (56.155 kg)  08/15/14 118 lb (53.524 kg)  07/08/14 116 lb (52.617 kg)   Constitutional: thin, in NAD Eyes: PERRLA, EOMI, no exophthalmos ENT: moist mucous membranes, no thyromegaly - full R thyroid lobe, no cervical lymphadenopathy Cardiovascular: tachycardia, RR, No MRG Respiratory: CTA B Gastrointestinal: abdomen soft, NT, ND, BS+ Musculoskeletal: no deformities, strength intact in all 4 Skin: moist, warm, no rashes Neurological: no tremor with outstretched hands, DTR normal in all 4  ASSESSMENT: 1. DM2, non-insulin-dependent, uncontrolled, without  complications - new dx  2. H/o Hyperthyroidism - 20 years ago - was on meds, cannot remember name  PLAN:  1. Patient with fairly recently dx'ed diabetes, on oral antidiabetic regimen, unchanged since last visit. She was getting diarrhea from Metformin >> now Metformin XR 750 mg in am. She did not get Tradjenta >> needs to restart. - I suggested to:  Patient Instructions  Please continue: - Metformin ER 750 mg in am - Glipizide XL 5 mg in am  Restart: - Tradjenta 5 mg in am  Please return in 3 month with your sugar log.   Please stop at the lab.  - continue checking sugars at different times of the day - check 2 times a day, rotating checks - she is up to date with yearly eye exams - refuses flu vaccine - will check HbA1c today - refilled Metformin and Tradjenta - given coupon - Return to clinic in 3 mo with sugar log   2. H/o hyperthyroidism - appears euthyroid - last TFTs normal at last visit, 1 mo ago - may need a thyroid U/S to investigate the R thyroid fullness - for now we will follow this clinically. She is asymptomatic.  Office Visit on 09/26/2014  Component Date Value Ref Range Status  . Hgb A1c MFr Bld 09/26/2014 6.7* 4.6 - 6.5 % Final   Glycemic Control Guidelines for People with Diabetes:Non Diabetic:  <6%Goal of Therapy: <7%Additional Action Suggested:  >8%    Hemoglobin A1c improved. We'll continue with the plan above, but have a low threshold to stop that glipizide if she develops lows.

## 2014-09-27 ENCOUNTER — Telehealth: Payer: Self-pay | Admitting: Internal Medicine

## 2014-09-27 NOTE — Telephone Encounter (Signed)
Continue without it, then. Keep an eye on the sugars. Last HbA1c was <7%, which is great.

## 2014-09-27 NOTE — Telephone Encounter (Signed)
Pt was not able to pick up the tradjenta it is $232. Please advise.

## 2014-09-27 NOTE — Telephone Encounter (Signed)
Please read note below and advise.  

## 2014-09-28 NOTE — Telephone Encounter (Signed)
Called pt and advised her per Dr Gherghe's note. Pt understood.  

## 2014-10-18 ENCOUNTER — Ambulatory Visit (INDEPENDENT_AMBULATORY_CARE_PROVIDER_SITE_OTHER): Payer: Medicare HMO | Admitting: Family Medicine

## 2014-10-18 ENCOUNTER — Encounter: Payer: Self-pay | Admitting: Family Medicine

## 2014-10-18 VITALS — BP 186/92 | HR 79 | Temp 98.2°F | Wt 124.0 lb

## 2014-10-18 DIAGNOSIS — K219 Gastro-esophageal reflux disease without esophagitis: Secondary | ICD-10-CM

## 2014-10-18 DIAGNOSIS — R140 Abdominal distension (gaseous): Secondary | ICD-10-CM

## 2014-10-18 DIAGNOSIS — R1010 Upper abdominal pain, unspecified: Secondary | ICD-10-CM

## 2014-10-18 DIAGNOSIS — Z8639 Personal history of other endocrine, nutritional and metabolic disease: Secondary | ICD-10-CM

## 2014-10-18 LAB — CBC WITH DIFFERENTIAL/PLATELET
BASOS ABS: 0 10*3/uL (ref 0.0–0.1)
Basophils Relative: 0.2 % (ref 0.0–3.0)
EOS ABS: 0.1 10*3/uL (ref 0.0–0.7)
Eosinophils Relative: 0.7 % (ref 0.0–5.0)
HCT: 46 % (ref 36.0–46.0)
Hemoglobin: 15.2 g/dL — ABNORMAL HIGH (ref 12.0–15.0)
LYMPHS PCT: 25.7 % (ref 12.0–46.0)
Lymphs Abs: 1.9 10*3/uL (ref 0.7–4.0)
MCHC: 33.2 g/dL (ref 30.0–36.0)
MCV: 88.8 fl (ref 78.0–100.0)
Monocytes Absolute: 0.6 10*3/uL (ref 0.1–1.0)
Monocytes Relative: 7.5 % (ref 3.0–12.0)
Neutro Abs: 4.9 10*3/uL (ref 1.4–7.7)
Neutrophils Relative %: 65.9 % (ref 43.0–77.0)
PLATELETS: 598 10*3/uL — AB (ref 150.0–400.0)
RBC: 5.18 Mil/uL — ABNORMAL HIGH (ref 3.87–5.11)
RDW: 18 % — AB (ref 11.5–15.5)
WBC: 7.5 10*3/uL (ref 4.0–10.5)

## 2014-10-18 LAB — POCT URINALYSIS DIPSTICK
Bilirubin, UA: NEGATIVE
GLUCOSE UA: NEGATIVE
Ketones, UA: NEGATIVE
Leukocytes, UA: NEGATIVE
NITRITE UA: NEGATIVE
RBC UA: NEGATIVE
Spec Grav, UA: 1.025
UROBILINOGEN UA: NEGATIVE
pH, UA: 5.5

## 2014-10-18 LAB — BASIC METABOLIC PANEL
BUN: 20 mg/dL (ref 6–23)
CHLORIDE: 105 meq/L (ref 96–112)
CO2: 30 meq/L (ref 19–32)
Calcium: 9.7 mg/dL (ref 8.4–10.5)
Creatinine, Ser: 0.75 mg/dL (ref 0.40–1.20)
GFR: 80.5 mL/min (ref >60.00)
Glucose, Bld: 134 mg/dL — ABNORMAL HIGH (ref 70–99)
Potassium: 4.2 mEq/L (ref 3.5–5.1)
SODIUM: 139 meq/L (ref 135–145)

## 2014-10-18 LAB — HEPATIC FUNCTION PANEL
ALT: 30 U/L (ref 0–35)
AST: 22 U/L (ref 0–37)
Albumin: 3.8 g/dL (ref 3.5–5.2)
Alkaline Phosphatase: 76 U/L (ref 39–117)
Bilirubin, Direct: 0.1 mg/dL (ref 0.0–0.3)
TOTAL PROTEIN: 7.2 g/dL (ref 6.0–8.3)
Total Bilirubin: 0.5 mg/dL (ref 0.2–1.2)

## 2014-10-18 LAB — LIPASE: LIPASE: 14 U/L (ref 11.0–59.0)

## 2014-10-18 LAB — H. PYLORI ANTIBODY, IGG: H PYLORI IGG: NEGATIVE

## 2014-10-18 LAB — TSH: TSH: 1.44 u[IU]/mL (ref 0.35–4.50)

## 2014-10-18 LAB — AMYLASE: Amylase: 79 U/L (ref 27–131)

## 2014-10-18 MED ORDER — RANITIDINE HCL 150 MG PO TABS: 150.0000 mg | ORAL_TABLET | Freq: Two times a day (BID) | ORAL | Status: DC

## 2014-10-18 NOTE — Patient Instructions (Signed)

## 2014-10-18 NOTE — Progress Notes (Signed)
Pre visit review using our clinic review tool, if applicable. No additional management support is needed unless otherwise documented below in the visit note. 

## 2014-10-18 NOTE — Progress Notes (Signed)
Subjective:    Patient ID: Brandi Mccormick, female    DOB: 05/29/1941, 74 y.o.   MRN: [id number]  HPI  Patient here for abd bloating.  Past Medical History  Diagnosis Date  . Diabetes   . Thyroid disease     Not on med's  . Shingles     Review of Systems  Constitutional: Negative for activity change, appetite change, fatigue and unexpected weight change.  Respiratory: Negative for cough and shortness of breath.   Cardiovascular: Negative for chest pain and palpitations.  Gastrointestinal: Positive for abdominal distention. Negative for nausea, vomiting, abdominal pain, diarrhea, constipation, blood in stool, anal bleeding and rectal pain.  Genitourinary: Negative for dysuria, frequency, flank pain, difficulty urinating and dyspareunia.       Pelvic bloating  Psychiatric/Behavioral: Negative for behavioral problems and dysphoric mood. The patient is not nervous/anxious.        Objective:    Physical Exam  Constitutional: She is oriented to person, place, and time. She appears well-developed and well-nourished. No distress.  HENT:  Right Ear: External ear normal.  Left Ear: External ear normal.  Nose: Nose normal.  Mouth/Throat: Oropharynx is clear and moist.  Eyes: EOM are normal. Pupils are equal, round, and reactive to light.  Neck: Normal range of motion. Neck supple.  Cardiovascular: Normal rate, regular rhythm and normal heart sounds.   No murmur heard. Pulmonary/Chest: Effort normal and breath sounds normal. No respiratory distress. She has no wheezes. She has no rales. She exhibits no tenderness.  Abdominal: Soft. Bowel sounds are normal. She exhibits distension. She exhibits no mass. There is no tenderness. There is no rebound and no guarding.  Neurological: She is alert and oriented to person, place, and time.  Psychiatric: She has a normal mood and affect. Her behavior is normal. Judgment and thought content normal.    BP 186/92 mmHg  Pulse 79  Temp(Src)  98.2 F (36.8 C) (Oral)  Wt 124 lb (56.246 kg)  SpO2 98% Wt Readings from Last 3 Encounters:  10/18/14 124 lb (56.246 kg)  09/26/14 123 lb 12.8 oz (56.155 kg)  08/15/14 118 lb (53.524 kg)     Lab Results  Component Value Date   WBC 7.2 07/08/2014   HGB 12.7 07/08/2014   HCT 38.7 07/08/2014   PLT 576.0* 07/08/2014   GLUCOSE 71 07/01/2014   CHOL 205* 02/17/2014   TRIG 90.0 02/17/2014   HDL 61.70 02/17/2014   LDLCALC 125* 02/17/2014   ALT 18 02/17/2014   AST 17 02/17/2014   NA 141 07/01/2014   K 4.4 07/01/2014   CL 101 07/01/2014   CREATININE 0.7 07/01/2014   BUN 14 07/01/2014   CO2 27 07/01/2014   TSH 0.93 08/15/2014   HGBA1C 6.7* 09/26/2014   MICROALBUR 16.7* 02/21/2014    Dg Chest 2 View (if Patient Has Fever And/or Copd)  06/24/2014   CLINICAL DATA:  Coughing ingestion getting worse of the last week  EXAM: CHEST  2 VIEW  COMPARISON:  None.  FINDINGS: Cardiac shadow is within normal limits. The lungs are well aerated bilaterally. Increased density is noted in the right lung base projecting in the right lower lobe on the lateral projection consistent with acute infiltrate. No acute bony abnormality is seen. No other focal abnormality is noted.  IMPRESSION: Right lower lobe pneumonia.   Electronically Signed   By: Inez Catalina M.D.   On: 06/24/2014 10:32       Assessment & Plan:   Problem  List Items Addressed This Visit    None    Visit Diagnoses    Gastroesophageal reflux disease without esophagitis    -  Primary    Relevant Medications    ranitidine (ZANTAC) tablet    Other Relevant Orders    H. pylori antibody, IgG    US Abdomen Complete    US Pelvis Complete    US Transvaginal Non-OB    Pain of upper abdomen        Relevant Orders    Basic metabolic panel    CBC with Differential/Platelet    Hepatic function panel    POCT urinalysis dipstick    TSH    H. pylori antibody, IgG    US Abdomen Complete    US Pelvis Complete    US Transvaginal Non-OB     Amylase    Lipase    Abdominal bloating        Relevant Orders    Basic metabolic panel    CBC with Differential/Platelet    Hepatic function panel    POCT urinalysis dipstick    TSH    US Abdomen Complete    US Pelvis Complete    US Transvaginal Non-OB    Amylase    Lipase        Garnet Koyanagi, DO

## 2014-10-20 ENCOUNTER — Ambulatory Visit (HOSPITAL_BASED_OUTPATIENT_CLINIC_OR_DEPARTMENT_OTHER): Payer: Medicare HMO

## 2014-12-27 ENCOUNTER — Ambulatory Visit (INDEPENDENT_AMBULATORY_CARE_PROVIDER_SITE_OTHER): Payer: Medicare HMO | Admitting: Internal Medicine

## 2014-12-27 ENCOUNTER — Encounter: Payer: Self-pay | Admitting: Internal Medicine

## 2014-12-27 VITALS — BP 108/62 | HR 83 | Temp 97.1°F | Resp 12 | Wt 125.0 lb

## 2014-12-27 DIAGNOSIS — E119 Type 2 diabetes mellitus without complications: Secondary | ICD-10-CM

## 2014-12-27 DIAGNOSIS — Z8639 Personal history of other endocrine, nutritional and metabolic disease: Secondary | ICD-10-CM

## 2014-12-27 LAB — HEMOGLOBIN A1C: HEMOGLOBIN A1C: 6.6 % — AB (ref 4.6–6.5)

## 2014-12-27 MED ORDER — GLIPIZIDE ER 5 MG PO TB24: ORAL_TABLET | ORAL | Status: DC

## 2014-12-27 NOTE — Progress Notes (Signed)
Patient ID: Brandi Mccormick, female   DOB: August 28, 1941, 74 y.o.   MRN: [id number]  HPI: Brandi Mccormick is a 74 y.o.-year-old female, returning for f/u for DM2, dx 2013, non-insulin-dependent, uncontrolled, without complications and h/o hyperthyroidism. Last visit 3 mo ago.   She has a h/o hyperthyroidism, tx with meds ~20 years ago. No other history available.  She is asymptomatic. Denies palpitations, tremors, heat intolerance, weight loss, hyperdefecation.  Recent labs reviewed - normal at last check: Lab Results  Component Value Date   TSH 1.44 10/18/2014   TSH 0.93 08/15/2014   TSH 0.44 02/17/2014   TSH 0.28* 12/20/2013   FREET4 0.91 08/15/2014   FREET4 1.00 02/17/2014   FREET4 0.97 12/20/2013   DM2:  Last hemoglobin A1c greatly improved in last year: Lab Results  Component Value Date   HGBA1C 6.7* 09/26/2014   HGBA1C 7.2* 05/23/2014   HGBA1C 8.1* 02/17/2014   Pt is on a regimen of: - Metformin ER 750 mg po in am - Glipizide XL 5 mg in am We tried Tradjenta 5 mg daily in am - too expensive.  Pt now checks her sugars - 2x a day and they are ~same as before - highs after dinner: - am: n/c >> 170-230 >> 95-194 >> 163-247 >> 97-115 >> 113-176 >> 94-176, 210x1 >> 91-163, 175 - 2h after b'fast: n/c >> 144,160-280 >> 151, 157, 170 >> n/c >> 93-175, 214 >> 175, 181 >> 161 - before lunch: n/c >> 178, 184 >> 187 >> n/c >> 76-92 >> 79 >> 96-149 - 2h after lunch: 105, 173 >> 191-270 >> 117-120 >> n/c >> 126, 172 >> 154, 278 (pound cake) - before dinner: n/c >> 70, 101, 149 >> n/c >> 108-174 >> 108-177 >> 78, 106-141 - 2h after dinner: 153, 237, 240, 289 >> 158-225 >> n/c >> 99-196, 271x1 >> 148, 168 >> 125, 153, 201, 226, 288 - bedtime: n/c >> 182-280 >> 86, 111, 142, 195 >> 285, 313 >> 117-120 >> 70, 111-190 >> 117-173, 205 x1 - nighttime: n/c >> 161 No lows; had 1 CBG at 79 >> ? If she  she has hypoglycemia awareness. Highest sugar was  289 x1 >> 313 x1 >> <150 >> 271x1 >> 201x1  >> 288   Meter: OneTouch Verio.  Pt's meals are: - Breakfast: egg, coffee, juice; sometime - Lunch: No Lunch - Dinner: meat + vegetables - Snacks: 1-2  - no CKD, last BUN/creatinine:  Lab Results  Component Value Date   BUN 20 10/18/2014   CREATININE 0.75 10/18/2014  ACR 8 on 11/16/2013. - last set of lipids: Lab Results  Component Value Date   CHOL 205* 02/17/2014   HDL 61.70 02/17/2014   LDLCALC 125* 02/17/2014   TRIG 90.0 02/17/2014   CHOLHDL 3 02/17/2014  on Simvastatin. - last eye exam was in 12/06/2013. No DR.  - no numbness and tingling in her feet.  ROS: Constitutional: no weight gain/loss, no fatigue, no subjective hyperthermia/hypothermia Eyes: no blurry vision, no xerophthalmia ENT: no sore throat, no nodules palpated in throat, no dysphagia/odynophagia, no hoarseness Cardiovascular: no CP/SOB/palpitations/leg swelling Respiratory: no cough/SOB Gastrointestinal: no N/V/D/no C Musculoskeletal: no muscle/joint aches Skin: no rashes Neurological: no tremors/numbness/tingling/dizziness  I reviewed pt's medications, allergies, PMH, social hx, family hx, and changes were documented in the history of present illness. Otherwise, unchanged from my initial visit note.  PE: BP 108/62 mmHg  Pulse 83  Temp(Src) 97.1 F (36.2 C) (Oral)  Resp 12  Wt 125 lb (56.7 kg)  SpO2 96% Wt Readings from Last 3 Encounters:  12/27/14 125 lb (56.7 kg)  10/18/14 124 lb (56.246 kg)  09/26/14 123 lb 12.8 oz (56.155 kg)   Constitutional: thin, in NAD Eyes: PERRLA, EOMI, no exophthalmos ENT: moist mucous membranes, no thyromegaly - full R thyroid lobe, no cervical lymphadenopathy Cardiovascular: tachycardia, RR, No MRG Respiratory: CTA B Gastrointestinal: abdomen soft, NT, ND, BS+ Musculoskeletal: no deformities, strength intact in all 4 Skin: moist, warm, no rashes Neurological: no tremor with outstretched hands, DTR normal in all 4  ASSESSMENT: 1. DM2,  non-insulin-dependent, uncontrolled, without complications  2. H/o Hyperthyroidism - 20 years ago - was on meds, cannot remember name  PLAN:  1. Patient with fairly recently dx'ed diabetes, on oral antidiabetic regimen, unchanged since last visit. Sugars are high after dinner >> add 1/2 of a glipizide XL tablet before dinner. She was getting diarrhea from Metformin >> now Metformin XR 750 mg in am. She did not get Tradjenta b/c too expensive.  - I suggested to:  Patient Instructions  Please continue: - Metformin ER 750 mg in am - Glipizide XL 5 mg in am  Add Glipizide XL 1/2 of a 5 mg tablet before dinner.   Please return in 3 month with your sugar log.   Please stop at the lab.  - continue checking sugars at different times of the day - check 2 times a day, rotating checks - needs a new eye exam - will check HbA1c today - Return to clinic in 3 mo with sugar log   2. H/o hyperthyroidism - appears euthyroid - last TFTs normal at last visit - we need a thyroid U/S to investigate the R thyroid fullness - refuses one for now  Office Visit on 12/27/2014  Component Date Value Ref Range Status  . Hgb A1c MFr Bld 12/27/2014 6.6* 4.6 - 6.5 % Final   Glycemic Control Guidelines for People with Diabetes:Non Diabetic:  <6%Goal of Therapy: <7%Additional Action Suggested:  >8%   HbA1c is a little better.

## 2014-12-27 NOTE — Patient Instructions (Signed)
Please continue: - Metformin ER 750 mg in am - Glipizide XL 5 mg in am  Add Glipizide XL 1/2 of a 5 mg tablet before dinner.   Please return in 3 month with your sugar log.   Please stop at the lab.

## 2015-02-03 ENCOUNTER — Other Ambulatory Visit: Payer: Self-pay | Admitting: Internal Medicine

## 2015-02-08 NOTE — Telephone Encounter (Signed)
Please refill the pt's glipizide

## 2015-02-27 ENCOUNTER — Other Ambulatory Visit: Payer: Self-pay

## 2015-03-02 ENCOUNTER — Other Ambulatory Visit: Payer: Self-pay

## 2015-03-02 MED ORDER — GLUCOSE BLOOD VI STRP: ORAL_STRIP | Status: DC

## 2015-03-28 ENCOUNTER — Ambulatory Visit: Payer: Medicare HMO | Admitting: Internal Medicine

## 2015-04-20 ENCOUNTER — Telehealth: Payer: Self-pay

## 2015-04-20 NOTE — Telephone Encounter (Signed)
Pt's husband returned the call, please call back

## 2015-04-20 NOTE — Telephone Encounter (Signed)
Called to schedule Medicare Wellness Visit with Health Coach.  Left a message for call back.  

## 2015-04-24 ENCOUNTER — Ambulatory Visit (INDEPENDENT_AMBULATORY_CARE_PROVIDER_SITE_OTHER): Payer: Medicare HMO

## 2015-04-24 VITALS — BP 176/86 | HR 83 | Temp 98.3°F | Ht 64.75 in | Wt 125.2 lb

## 2015-04-24 DIAGNOSIS — Z0000 Encounter for general adult medical examination without abnormal findings: Secondary | ICD-10-CM

## 2015-04-24 MED ORDER — ATORVASTATIN CALCIUM 20 MG PO TABS: 20.0000 mg | ORAL_TABLET | Freq: Every day | ORAL | Status: DC

## 2015-04-24 NOTE — Patient Instructions (Addendum)
Please consider completing a colonoscopy or IFobt, mammogram, and Dexa scan this year.  Schedule eye exam.  Eat at least 3 meals per day. Include lean protein, fruit/veggies, and whole grains.  Try Glucerna, Smoothies with protein, etc.    Increase physical activity by walking at least 3 days per week.    Limit sugar intake.  Trade cake and pies for fruit.    Follow up with Dr. Etter Sjogren in October.      Colonoscopy A colonoscopy is an exam to look at the entire large intestine (colon). This exam can help find problems such as tumors, polyps, inflammation, and areas of bleeding. The exam takes about 1 hour.  LET [facility] CARE PROVIDER KNOW ABOUT:   Any allergies you have.  All medicines you are taking, including vitamins, herbs, eye drops, creams, and over-the-counter medicines.  Previous problems you or members of your family have had with the use of anesthetics.  Any blood disorders you have.  Previous surgeries you have had.  Medical conditions you have. RISKS AND COMPLICATIONS  Generally, this is a safe procedure. However, as with any procedure, complications can occur. Possible complications include:  Bleeding.  Tearing or rupture of the colon wall.  Reaction to medicines given during the exam.  Infection (rare). BEFORE THE PROCEDURE   Ask your health care provider about changing or stopping your regular medicines.  You may be prescribed an oral bowel prep. This involves drinking a large amount of medicated liquid, starting the day before your procedure. The liquid will cause you to have multiple loose stools until your stool is almost clear or light green. This cleans out your colon in preparation for the procedure.  Do not eat or drink anything else once you have started the bowel prep, unless your health care provider tells you it is safe to do so.  Arrange for someone to drive you home after the procedure. PROCEDURE   You will be given medicine to help you  relax (sedative).  You will lie on your side with your knees bent.  A long, flexible tube with a light and camera on the end (colonoscope) will be inserted through the rectum and into the colon. The camera sends video back to a computer screen as it moves through the colon. The colonoscope also releases carbon dioxide gas to inflate the colon. This helps your health care provider see the area better.  During the exam, your health care provider may take a small tissue sample (biopsy) to be examined under a microscope if any abnormalities are found.  The exam is finished when the entire colon has been viewed. AFTER THE PROCEDURE   Do not drive for 24 hours after the exam.  You may have a small amount of blood in your stool.  You may pass moderate amounts of gas and have mild abdominal cramping or bloating. This is caused by the gas used to inflate your colon during the exam.  Ask when your test results will be ready and how you will get your results. Make sure you get your test results. Document Released: 08/16/2000 Document Revised: 06/09/2013 Document Reviewed: 04/26/2013 [facility] Patient Information 2015 [place], [place]. This information is not intended to replace advice given to you by your health care provider. Make sure you discuss any questions you have with your health care provider.  Fat and Cholesterol Control Diet Your diet has an affect on your fat and cholesterol levels in your blood and organs. Too much fat and  cholesterol in your blood can affect your:  Heart.  Blood vessels (arteries, veins).  Gallbladder.  Liver.  Pancreas. CONTROL FAT AND CHOLESTEROL WITH DIET Certain foods raise cholesterol and others lower it. It is important to replace bad fats with other types of fat.  Do not eat:  Fatty meats, such as hot dogs and salami.  Stick margarine and some tub margarines that have "partially hydrogenated oils" in them.  Baked goods, such as cookies and crackers  that have "partially hydrogenated oils" in them.  Saturated tropical oils, such as coconut and palm oil. Eat the following foods:  Round or loin cuts of red meat.  Chicken (without skin).  Fish.  Veal.  [place] breast.  Shellfish.  Fruit, such as apples.  Vegetables, such as broccoli, potatoes, and carrots.  Beans, peas, and lentils (legumes).  Grains, such as barley, rice, couscous, and bulgar wheat.  Pasta (without cream sauces). Look for foods that are nonfat, low in fat, and low in cholesterol.  FIND FOODS THAT ARE LOWER IN FAT AND CHOLESTEROL  Find foods with soluble fiber and plant sterols (phytosterol). You should eat 2 grams a day of these foods. These foods include:  Fruits.  Vegetables.  Whole grains.  Dried beans and peas.  Nuts and seeds.  Read package labels. Look for low-saturated fats, trans fat free, low-fat foods.  Choose cheese that have only 2 to 3 grams of saturated fat per ounce.  Use heart-healthy tub margarine that is free of trans fat or partially hydrogenated oil.  Avoid buying baked goods that have partially hydrogenated oils in them. Instead, buy baked goods made with whole grains (whole-wheat or whole oat flour). Avoid baked goods labeled with "flour" or "enriched flour."  Buy non-creamy canned soups with reduced salt and no added fats. PREPARING YOUR FOOD  Broil, bake, steam, or roast foods. Do not fry food.  Use non-stick cooking sprays.  Use lemon or herbs to flavor food instead of using butter or stick margarine.  Use nonfat yogurt, salsa, or low-fat dressings for salads. LOW-SATURATED FAT / LOW-FAT FOOD SUBSTITUTES  Meats / Saturated Fat (g)  Avoid: Steak, marbled (3 oz/85 g) / 11 g.  Choose: Steak, lean (3 oz/85 g) / 4 g.  Avoid: Hamburger (3 oz/85 g) / 7 g.  Choose: Hamburger, lean (3 oz/85 g) / 5 g.  Avoid: Ham (3 oz/85 g) / 6 g.  Choose: Ham, lean cut (3 oz/85 g) / 2.4 g.  Avoid: Chicken, with skin,  dark meat (3 oz/85 g) / 4 g.  Choose: Chicken, skin removed, dark meat (3 oz/85 g) / 2 g.  Avoid: Chicken, with skin, light meat (3 oz/85 g) / 2.5 g.  Choose: Chicken, skin removed, light meat (3 oz/85 g) / 1 g. Dairy / Saturated Fat (g)  Avoid: Whole milk (1 cup) / 5 g.  Choose: Low-fat milk, 2% (1 cup) / 3 g.  Choose: Low-fat milk, 1% (1 cup) / 1.5 g.  Choose: Skim milk (1 cup) / 0.3 g.  Avoid: Hard cheese (1 oz/28 g) / 6 g.  Choose: Skim milk cheese (1 oz/28 g) / 2 to 3 g.  Avoid: Cottage cheese, 4% fat (1 cup) / 6.5 g.  Choose: Low-fat cottage cheese, 1% fat (1 cup) / 1.5 g.  Avoid: Ice cream (1 cup) / 9 g.  Choose: Sherbet (1 cup) / 2.5 g.  Choose: Nonfat frozen yogurt (1 cup) / 0.3 g.  Choose: Frozen fruit bar /  trace.  Avoid: Whipped cream (1 tbs) / 3.5 g.  Choose: Nondairy whipped topping (1 tbs) / 1 g. Condiments / Saturated Fat (g)  Avoid: Mayonnaise (1 tbs) / 2 g.  Choose: Low-fat mayonnaise (1 tbs) / 1 g.  Avoid: Butter (1 tbs) / 7 g.  Choose: Extra light margarine (1 tbs) / 1 g.  Avoid: Coconut oil (1 tbs) / 11.8 g.  Choose: Olive oil (1 tbs) / 1.8 g.  Choose: Corn oil (1 tbs) / 1.7 g.  Choose: Safflower oil (1 tbs) / 1.2 g.  Choose: Sunflower oil (1 tbs) / 1.4 g.  Choose: Soybean oil (1 tbs) / 2.4 g .  Choose: Canola oil (1 tbs) / 1 g. Document Released: 02/18/2012 Document Revised: 04/21/2013 Document Reviewed: 11/18/2013 [facility] Patient Information 2015 [place], [place]. This information is not intended to replace advice given to you by your health care provider. Make sure you discuss any questions you have with your health care provider.  Health Maintenance Adopting a healthy lifestyle and getting preventive care can go a long way to promote health and wellness. Talk with your health care provider about what schedule of regular examinations is right for you. This is a good chance for you to check in with your provider about disease  prevention and staying healthy. In between checkups, there are plenty of things you can do on your own. Experts have done a lot of research about which lifestyle changes and preventive measures are most likely to keep you healthy. Ask your health care provider for more information. WEIGHT AND DIET  Eat a healthy diet  Be sure to include plenty of vegetables, fruits, low-fat dairy products, and lean protein.  Do not eat a lot of foods high in solid fats, added sugars, or salt.  Get regular exercise. This is one of the most important things you can do for your health.  Most adults should exercise for at least 150 minutes each week. The exercise should increase your heart rate and make you sweat (moderate-intensity exercise).  Most adults should also do strengthening exercises at least twice a week. This is in addition to the moderate-intensity exercise.  Maintain a healthy weight  Body mass index (BMI) is a measurement that can be used to identify possible weight problems. It estimates body fat based on height and weight. Your health care provider can help determine your BMI and help you achieve or maintain a healthy weight.  For females 63 years of age and older:   A BMI below 18.5 is considered underweight.  A BMI of 18.5 to 24.9 is normal.  A BMI of 25 to 29.9 is considered overweight.  A BMI of 30 and above is considered obese.  Watch levels of cholesterol and blood lipids  You should start having your blood tested for lipids and cholesterol at 73 years of age, then have this test every 5 years.  You may need to have your cholesterol levels checked more often if:  Your lipid or cholesterol levels are high.  You are older than 73 years of age.  You are at high risk for heart disease.  CANCER SCREENING   Lung Cancer  Lung cancer screening is recommended for adults 66-48 years old who are at high risk for lung cancer because of a history of smoking.  A yearly low-dose  CT scan of the lungs is recommended for people who:  Currently smoke.  Have quit within the past 15 years.  Have at least  a 30-pack-year history of smoking. A pack year is smoking an average of one pack of cigarettes a day for 1 year.  Yearly screening should continue until it has been 15 years since you quit.  Yearly screening should stop if you develop a health problem that would prevent you from having lung cancer treatment.  Breast Cancer  Practice breast self-awareness. This means understanding how your breasts normally appear and feel.  It also means doing regular breast self-exams. Let your health care provider know about any changes, no matter how small.  If you are in your 20s or 30s, you should have a clinical breast exam (CBE) by a health care provider every 1-3 years as part of a regular health exam.  If you are 4 or older, have a CBE every year. Also consider having a breast X-ray (mammogram) every year.  If you have a family history of breast cancer, talk to your health care provider about genetic screening.  If you are at high risk for breast cancer, talk to your health care provider about having an MRI and a mammogram every year.  Breast cancer gene (BRCA) assessment is recommended for women who have family members with BRCA-related cancers. BRCA-related cancers include:  Breast.  Ovarian.  Tubal.  Peritoneal cancers.  Results of the assessment will determine the need for genetic counseling and BRCA1 and BRCA2 testing. Cervical Cancer Routine pelvic examinations to screen for cervical cancer are no longer recommended for nonpregnant women who are considered low risk for cancer of the pelvic organs (ovaries, uterus, and vagina) and who do not have symptoms. A pelvic examination may be necessary if you have symptoms including those associated with pelvic infections. Ask your health care provider if a screening pelvic exam is right for you.   The Pap test is the  screening test for cervical cancer for women who are considered at risk.  If you had a hysterectomy for a problem that was not cancer or a condition that could lead to cancer, then you no longer need Pap tests.  If you are older than 65 years, and you have had normal Pap tests for the past 10 years, you no longer need to have Pap tests.  If you have had past treatment for cervical cancer or a condition that could lead to cancer, you need Pap tests and screening for cancer for at least 20 years after your treatment.  If you no longer get a Pap test, assess your risk factors if they change (such as having a new sexual partner). This can affect whether you should start being screened again.  Some women have medical problems that increase their chance of getting cervical cancer. If this is the case for you, your health care provider may recommend more frequent screening and Pap tests.  The human papillomavirus (HPV) test is another test that may be used for cervical cancer screening. The HPV test looks for the virus that can cause cell changes in the cervix. The cells collected during the Pap test can be tested for HPV.  The HPV test can be used to screen women 97 years of age and older. Getting tested for HPV can extend the interval between normal Pap tests from three to five years.  An HPV test also should be used to screen women of any age who have unclear Pap test results.  After 73 years of age, women should have HPV testing as often as Pap tests.  Colorectal Cancer  This type  of cancer can be detected and often prevented.  Routine colorectal cancer screening usually begins at 73 years of age and continues through 73 years of age.  Your health care provider may recommend screening at an earlier age if you have risk factors for colon cancer.  Your health care provider may also recommend using home test kits to check for hidden blood in the stool.  A small camera at the end of a tube can  be used to examine your colon directly (sigmoidoscopy or colonoscopy). This is done to check for the earliest forms of colorectal cancer.  Routine screening usually begins at age 18.  Direct examination of the colon should be repeated every 5-10 years through 73 years of age. However, you may need to be screened more often if early forms of precancerous polyps or small growths are found. Skin Cancer  Check your skin from head to toe regularly.  Tell your health care provider about any new moles or changes in moles, especially if there is a change in a mole's shape or color.  Also tell your health care provider if you have a mole that is larger than the size of a pencil eraser.  Always use sunscreen. Apply sunscreen liberally and repeatedly throughout the day.  Protect yourself by wearing long sleeves, pants, a wide-brimmed hat, and sunglasses whenever you are outside. HEART DISEASE, DIABETES, AND HIGH BLOOD PRESSURE   Have your blood pressure checked at least every 1-2 years. High blood pressure causes heart disease and increases the risk of stroke.  If you are between 30 years and 46 years old, ask your health care provider if you should take aspirin to prevent strokes.  Have regular diabetes screenings. This involves taking a blood sample to check your fasting blood sugar level.  If you are at a normal weight and have a low risk for diabetes, have this test once every three years after 73 years of age.  If you are overweight and have a high risk for diabetes, consider being tested at a younger age or more often. PREVENTING INFECTION  Hepatitis B  If you have a higher risk for hepatitis B, you should be screened for this virus. You are considered at high risk for hepatitis B if:  You were born in a country where hepatitis B is common. Ask your health care provider which countries are considered high risk.  Your parents were born in a high-risk country, and you have not been  immunized against hepatitis B (hepatitis B vaccine).  You have HIV or AIDS.  You use needles to inject street drugs.  You live with someone who has hepatitis B.  You have had sex with someone who has hepatitis B.  You get hemodialysis treatment.  You take certain medicines for conditions, including cancer, organ transplantation, and autoimmune conditions. Hepatitis C  Blood testing is recommended for:  Everyone born from [birth year] through 1964.  Anyone with known risk factors for hepatitis C. Sexually transmitted infections (STIs)  You should be screened for sexually transmitted infections (STIs) including gonorrhea and chlamydia if:  You are sexually active and are younger than 73 years of age.  You are older than 73 years of age and your health care provider tells you that you are at risk for this type of infection.  Your sexual activity has changed since you were last screened and you are at an increased risk for chlamydia or gonorrhea. Ask your health care provider if you are at risk.  If you do not have HIV, but are at risk, it may be recommended that you take a prescription medicine daily to prevent HIV infection. This is called pre-exposure prophylaxis (PrEP). You are considered at risk if:  You are sexually active and do not regularly use condoms or know the HIV status of your partner(s).  You take drugs by injection.  You are sexually active with a partner who has HIV. Talk with your health care provider about whether you are at high risk of being infected with HIV. If you choose to begin PrEP, you should first be tested for HIV. You should then be tested every 3 months for as long as you are taking PrEP.  PREGNANCY   If you are premenopausal and you may become pregnant, ask your health care provider about preconception counseling.  If you may become pregnant, take 400 to 800 micrograms (mcg) of folic acid every day.  If you want to prevent pregnancy, talk to your  health care provider about birth control (contraception). OSTEOPOROSIS AND MENOPAUSE   Osteoporosis is a disease in which the bones lose minerals and strength with aging. This can result in serious bone fractures. Your risk for osteoporosis can be identified using a bone density scan.  If you are 6 years of age or older, or if you are at risk for osteoporosis and fractures, ask your health care provider if you should be screened.  Ask your health care provider whether you should take a calcium or vitamin D supplement to lower your risk for osteoporosis.  Menopause may have certain physical symptoms and risks.  Hormone replacement therapy may reduce some of these symptoms and risks. Talk to your health care provider about whether hormone replacement therapy is right for you.  HOME CARE INSTRUCTIONS   Schedule regular health, dental, and eye exams.  Stay current with your immunizations.   Do not use any tobacco products including cigarettes, chewing tobacco, or electronic cigarettes.  If you are pregnant, do not drink alcohol.  If you are breastfeeding, limit how much and how often you drink alcohol.  Limit alcohol intake to no more than 1 drink per day for nonpregnant women. One drink equals 12 ounces of beer, 5 ounces of wine, or 1 ounces of hard liquor.  Do not use street drugs.  Do not share needles.  Ask your health care provider for help if you need support or information about quitting drugs.  Tell your health care provider if you often feel depressed.  Tell your health care provider if you have ever been abused or do not feel safe at home. Document Released: 03/04/2011 Document Revised: 01/03/2014 Document Reviewed: 07/21/2013 [facility] Patient Information 2015 [place], [place]. This information is not intended to replace advice given to you by your health care provider. Make sure you discuss any questions you have with your health care provider.

## 2015-04-24 NOTE — Telephone Encounter (Signed)
Appointment scheduled.

## 2015-04-24 NOTE — Progress Notes (Addendum)
Subjective:   Brandi Mccormick is a 74 y.o. female who presents for Medicare Annual (Subsequent) preventive examination.  She describes her overall health as "pretty good."   Review of Systems:  NO ROS  Sleep patterns:   Sleeps 4-5 hours per night/Gets up at least once   Home Safety/Smoke Alarms:  Feels safe at home.  Lives in 1 story home with husband.  Smoke detector and alarm system present.  Firearm Safety: 1 firearm kept in safe place.   Seat Belt Safety/Bike Helmet:  Always wears seat belt.    Counseling:   Eye Exam- Discussed; plan to scheduled an appointment.   Dental- goes regularly--twice a year--has an appointment next month Female:  Pap- No longer receive    Mammo- Discussed, plans to schedule an appointment.      Dexa scan-Ordered.      CCS- Discussed. Declines, but is considering.   Immunizations-pt states she would like to postpone at this time. Foot exam: Completed during this office visit.    Objective:   Vitals: BP 176/86 mmHg  Pulse 83  Temp(Src) 98.3 F (36.8 C) (Oral)  Ht 5' 4.75" (1.645 m)  Wt 125 lb 3.2 oz (56.79 kg)  BMI 20.99 kg/m2  SpO2 98%  Tobacco History  Smoking status  . Never Smoker   Smokeless tobacco  . Never Used     Counseling given: Not Answered   Past Medical History  Diagnosis Date  . Diabetes   . Thyroid disease     Not on med's  . Shingles    Past Surgical History  Procedure Laterality Date  . Dental surgery     Family History  Problem Relation Age of Onset  . Diabetes Mother     and on mother's side of family  . Colon cancer Mother   . Stroke Mother   . Diabetes Father     and on father's side of family  . Diabetes Sister   . Diabetes Brother   . Diabetes Sister    History  Sexual Activity  . Sexual Activity:  . Partners: Male    Comment: married-husband     Outpatient Encounter Prescriptions as of 04/24/2015  Medication Sig  . atorvastatin (LIPITOR) 20 MG tablet Take 1 tablet (20 mg total) by mouth  daily. Repeat labs are due now for more refills.  Marland Kitchen glipiZIDE (GLUCOTROL XL) 5 MG 24 hr tablet Take 1 tablet before breakfast and 1/2 tablet before dinner  . glipiZIDE (GLUCOTROL XL) 5 MG 24 hr tablet TAKE ONE TABLET BY MOUTH ONCE DAILY WITH  BREAKFAST  . glucose blood (ONETOUCH VERIO) test strip Check Blood sugar once daily. Dx: E11.9  . metFORMIN (GLUCOPHAGE-XR) 750 MG 24 hr tablet Take 1 tablet (750 mg total) by mouth daily with breakfast.  . ONETOUCH DELICA LANCETS 99991111 MISC   . ranitidine (ZANTAC) 150 MG tablet Take 1 tablet (150 mg total) by mouth 2 (two) times daily. (Patient taking differently: Take 150 mg by mouth at bedtime. )  . [DISCONTINUED] atorvastatin (LIPITOR) 20 MG tablet Take 1 tablet (20 mg total) by mouth daily. Repeat labs are due nor for more refills.   No facility-administered encounter medications on file as of 04/24/2015.    Activities of Daily Living In your present state of health, do you have any difficulty performing the following activities: 04/24/2015 06/24/2014  Hearing? N N  Vision? N N  Difficulty concentrating or making decisions? N N  Walking or climbing stairs? N N  Dressing or bathing? N N  Doing errands, shopping? N N  Preparing Food and eating ? N -  Using the Toilet? N -  In the past six months, have you accidently leaked urine? N -  Do you have problems with loss of bowel control? N -  Managing your Medications? N -  Managing your Finances? N -  Housekeeping or managing your Housekeeping? N -    Patient Care Team: Rosalita Chessman, DO as PCP - General (Family Medicine) Philemon Kingdom, MD as Consulting Physician (Internal Medicine)    Assessment:  Thrombocytosis- Plt count last assessed on 10/18/14. Pt encouraged to follow up with Dr. Etter Sjogren.    Protein-calorie malnutrition- pt states this was an issue last year when she had pneumonia.  Discussed the importance of eating regular meals at regular intervals.  Discussed ways to increase meal and  protein intake.    H/O Hyperthyroidism- treated with meds 20 yrs ago.  Currently asymptomatic.    Diabetes, type II-on Glipizide and metformin.  Monitors blood sugars at least once a day.  Per patient, BS range 120-130s before meals.  States she has occasional lows in the 68s.  Following with Dr. Cruzita Lederer.    Elevated BP: Pt blood pressure typically stable, however, elevated today.  Will make provider aware.  Pt denies chest pain, shortness of breath, headache, weakness, and blurred vision.    BMI: 20.99 kg/m2  Exercise Activities and Dietary recommendations Current Exercise Habits:: Home exercise routine (Yardwork/gardening ), Time (Minutes): 10, Frequency (Times/Week): 6, Weekly Exercise (Minutes/Week): 60, Intensity: Mild   Diet: Breakfast- egg/leftovers/bowl of cereal Lunch-no lunch Dinner-1 meat/vegetable/starch Snack-cheese and crackers Consumes very little fruit, but admits to eating too much sweets.    Goals    . Have 3 meals a day     At lunch add maybe Glucerna, Smoothies with protein, Sandwiches with lean protein/veggies, etc.     . Reduce sugar intake to 25 grams per day     Trade cake and pie for fruit.        Fall Risk Fall Risk  04/24/2015 11/16/2013  Falls in the past year? No No  Risk for fall due to : - History of fall(s)   Depression Screen PHQ 2/9 Scores 04/24/2015 11/16/2013  PHQ - 2 Score 0 0     Cognitive Testing MMSE - Mini Mental State Exam 04/24/2015  Orientation to time 5  Orientation to Place 5  Registration 3  Attention/ Calculation 4  Recall 2  Language- name 2 objects 2  Language- repeat 1  Language- follow 3 step command 3  Language- read & follow direction 1  Write a sentence 1  Copy design 1  Total score 28    Immunization History  Administered Date(s) Administered  . Pneumococcal Polysaccharide-23 11/16/2013   Screening Tests Health Maintenance  Topic Date Due  . URINE MICROALBUMIN  02/22/2015  . INFLUENZA VACCINE   04/03/2015  . OPHTHALMOLOGY EXAM  06/05/2015 (Originally 12/07/2014)  . DEXA SCAN  06/05/2015 (Originally 10/02/2006)  . COLONOSCOPY  06/05/2015 (Originally 10/03/1991)  . ZOSTAVAX  06/05/2015 (Originally 10/02/2001)  . TETANUS/TDAP  06/05/2015 (Originally 10/02/1960)  . PNA vac Low Risk Adult (2 of 2 - PCV13) 06/05/2015 (Originally 11/17/2014)  . HEMOGLOBIN A1C  06/28/2015  . MAMMOGRAM  03/08/2016  . FOOT EXAM  04/23/2016      Plan:   Please consider completing a colonoscopy or IFobt, mammogram, and Dexa scan this year.  Schedule eye exam.  Eat at  least 3 meals per day. Include lean protein, fruit/veggies, and whole grains.  Try Glucerna, Smoothies with protein, etc.    Increase physical activity by walking at least 3 times per week.    Limit sugar intake.  Trade cake and pies for fruit.    Follow up with Dr. Etter Sjogren in October.     During the course of the visit the patient was educated and counseled about the following appropriate screening and preventive services:   Vaccines to include Pneumoccal, Influenza, Hepatitis B, Td, Zostavax, HCV  Electrocardiogram  Cardiovascular Disease  Colorectal cancer screening  Bone density screening  Diabetes screening  Glaucoma screening  Mammography/PAP  Nutrition counseling   Patient Instructions (the written plan) was given to the patient.  Rudene Anda, RN  04/24/2015

## 2015-05-01 ENCOUNTER — Ambulatory Visit
Admission: RE | Admit: 2015-05-01 | Discharge: 2015-05-01 | Disposition: A | Discharge status: Home / Self Care | Payer: Medicare HMO | Source: Ambulatory Visit

## 2015-05-01 ENCOUNTER — Other Ambulatory Visit: Payer: Self-pay

## 2015-05-01 DIAGNOSIS — Z1231 Encounter for screening mammogram for malignant neoplasm of breast: Secondary | ICD-10-CM

## 2015-05-02 ENCOUNTER — Encounter: Payer: Self-pay | Admitting: Internal Medicine

## 2015-05-02 ENCOUNTER — Other Ambulatory Visit (INDEPENDENT_AMBULATORY_CARE_PROVIDER_SITE_OTHER): Payer: Medicare HMO | Admitting: *Deleted

## 2015-05-02 ENCOUNTER — Ambulatory Visit (INDEPENDENT_AMBULATORY_CARE_PROVIDER_SITE_OTHER): Payer: Medicare HMO | Admitting: Internal Medicine

## 2015-05-02 VITALS — BP 124/80 | HR 81 | Temp 98.4°F | Resp 12 | Wt 125.0 lb

## 2015-05-02 DIAGNOSIS — E119 Type 2 diabetes mellitus without complications: Secondary | ICD-10-CM

## 2015-05-02 DIAGNOSIS — Z8639 Personal history of other endocrine, nutritional and metabolic disease: Secondary | ICD-10-CM

## 2015-05-02 LAB — POCT GLYCOSYLATED HEMOGLOBIN (HGB A1C): HEMOGLOBIN A1C: 6.7

## 2015-05-02 MED ORDER — GLIPIZIDE ER 5 MG PO TB24: ORAL_TABLET | ORAL | Status: DC

## 2015-05-02 NOTE — Patient Instructions (Signed)
Please continue: - Metformin ER 750 mg in am - Glipizide XL 5 mg in am - Glipizide XL 1/2 of a 5 mg tablet before dinner.   Please return in 3-4 month with your sugar log.   Please have thyroid labs drawn at the appt with Dr Etter Sjogren next month.

## 2015-05-02 NOTE — Progress Notes (Signed)
Patient ID: Brandi Mccormick, female   DOB: Jan 13, 1941, 74 y.o.   MRN: [id number]  HPI: Brandi Mccormick is a 74 y.o.-year-old female, returning for f/u for DM2, dx 2013, non-insulin-dependent, uncontrolled, without complications and h/o hyperthyroidism. Last visit 3 mo ago.   She has a h/o hyperthyroidism, tx with meds ~20 years ago. No other history available.  She is asymptomatic. Denies palpitations, tremors, heat intolerance, weight loss, hyperdefecation.  Recent labs reviewed - normal at last check: Lab Results  Component Value Date   TSH 1.44 10/18/2014   TSH 0.93 08/15/2014   TSH 0.44 02/17/2014   TSH 0.28* 12/20/2013   FREET4 0.91 08/15/2014   FREET4 1.00 02/17/2014   FREET4 0.97 12/20/2013   DM2:  Last hemoglobin A1c greatly improved in last year: Lab Results  Component Value Date   HGBA1C 6.6* 12/27/2014   HGBA1C 6.7* 09/26/2014   HGBA1C 7.2* 05/23/2014   Pt is on a regimen of: - Metformin ER 750 mg in am - Glipizide XL 5 mg in am - Glipizide XL 1/2 of a 5 mg tablet before dinner.  We tried Tradjenta 5 mg daily in am - too expensive. She was getting diarrhea from Metformin >> now Metformin XR 750 mg in am >> tolerates it well.  Pt now checks her sugars - 2x a day and they are better: - am: 95-194 >> 163-247 >> 97-115 >> 113-176 >> 94-176, 210x1 >> 91-163, 175 >> 107-147, but higher last mo - 2h after b'fast: 144,160-280 >> 151, 157, 170 >> n/c >> 93-175, 214 >> 175, 181 >> 161 >> 127-136 - before lunch: n/c >> 178, 184 >> 187 >> n/c >> 76-92 >> 79 >> 96-149 >> 142-176 >>102-163, 176 (snack) - 2h after lunch: 105, 173 >> 191-270 >> 117-120 >> n/c >> 126, 172 >> 154, 278 (pound cake) >> 78-163 - before dinner: n/c >> 70, 101, 149 >> n/c >> 108-174 >> 108-177 >> 78, 106-141 >> 85-136, 154 - 2h after dinner: 158-225 >> n/c >> 99-196, 271x1 >> 148, 168 >> 125, 153, 201, 226, 288 >> 149, 170 - bedtime: 86, 111, 142, 195 >> 285, 313 >> 117-120 >> 70, 111-190 >> 117-173, 205  x1 >> n/c - nighttime: n/c >> 161 >> n/c No lows; had 1 CBG at 79 >> 78 If she  she has hypoglycemia awareness. Highest sugar was  289 x1 >> 313 x1 >> <150 >> 271x1 >> 201x1 >> 288 >> 170s  Meter: OneTouch Verio.  Pt's meals are: - Breakfast: egg, coffee, juice; sometime - Lunch: No Lunch - Dinner: meat + vegetables - Snacks: 1-2  - no CKD, last BUN/creatinine:  Lab Results  Component Value Date   BUN 20 10/18/2014   CREATININE 0.75 10/18/2014  ACR 8 on 11/16/2013. - last set of lipids: Lab Results  Component Value Date   CHOL 205* 02/17/2014   HDL 61.70 02/17/2014   LDLCALC 125* 02/17/2014   TRIG 90.0 02/17/2014   CHOLHDL 3 02/17/2014  on Simvastatin. - last eye exam was in 12/06/2013. No DR.  - no numbness and tingling in her feet.  ROS: Constitutional: no weight gain/loss, no fatigue, no subjective hyperthermia/hypothermia Eyes: no blurry vision, no xerophthalmia ENT: no sore throat, no nodules palpated in throat, no dysphagia/odynophagia, no hoarseness Cardiovascular: no CP/SOB/palpitations/leg swelling Respiratory: no cough/SOB Gastrointestinal: no N/V/D/C Musculoskeletal: no muscle/joint aches Skin: no rashes Neurological: no tremors/numbness/tingling/dizziness  I reviewed pt's medications, allergies, PMH, social hx, family hx, and changes were  documented in the history of present illness. Otherwise, unchanged from my initial visit note.  PE: BP 124/80 mmHg  Pulse 81  Temp(Src) 98.4 F (36.9 C) (Oral)  Resp 12  Wt 125 lb (56.7 kg)  SpO2 98% Wt Readings from Last 3 Encounters:  05/02/15 125 lb (56.7 kg)  04/24/15 125 lb 3.2 oz (56.79 kg)  12/27/14 125 lb (56.7 kg)   Constitutional: thin, in NAD Eyes: PERRLA, EOMI, no exophthalmos ENT: moist mucous membranes, no thyromegaly, no cervical lymphadenopathy Cardiovascular: tachycardia, RR, No MRG Respiratory: CTA B Gastrointestinal: abdomen soft, NT, ND, BS+ Musculoskeletal: no deformities, strength  intact in all 4 Skin: moist, warm, no rashes Neurological: no tremor with outstretched hands, DTR normal in all 4  ASSESSMENT: 1. DM2, non-insulin-dependent, uncontrolled, without complications  2. H/o Hyperthyroidism - 20 years ago - was on meds, cannot remember name  PLAN:  1. Patient with fairly recently dx'ed diabetes, on oral antidiabetic regimen, unchanged since last visit. Sugars were high after dinner >> we added 1/2 of a glipizide XL tablet before dinner >> sugars better after dinner, but not many checks. Continue current regimen. Refilled Glipizide XL. - I suggested to:  Patient Instructions  Please continue: - Metformin ER 750 mg in am - Glipizide XL 5 mg in am - Glipizide XL 1/2 of a 5 mg tablet before dinner.   Please return in 3-4 month with your sugar log.   Please have thyroid labs drawn at the appt with Dr Etter Sjogren next month.  - continue checking sugars at different times of the day - check 1-2 times a day, rotating checks - needs a new eye exam >> advised to schedule - will check HbA1c today >> 6.7%! - Return to clinic in 3 mo with sugar log   2. H/o hyperthyroidism - appears euthyroid - last TFTs normal at last visit >> will check at next lab draw in 1.5 mo

## 2015-05-03 ENCOUNTER — Other Ambulatory Visit: Payer: Self-pay | Admitting: Family Medicine

## 2015-05-03 DIAGNOSIS — R928 Other abnormal and inconclusive findings on diagnostic imaging of breast: Secondary | ICD-10-CM

## 2015-05-04 ENCOUNTER — Other Ambulatory Visit: Payer: Self-pay | Admitting: Family Medicine

## 2015-05-04 ENCOUNTER — Other Ambulatory Visit: Payer: Self-pay

## 2015-05-04 DIAGNOSIS — R928 Other abnormal and inconclusive findings on diagnostic imaging of breast: Secondary | ICD-10-CM

## 2015-05-09 ENCOUNTER — Ambulatory Visit
Admission: RE | Admit: 2015-05-09 | Discharge: 2015-05-09 | Disposition: A | Discharge status: Home / Self Care | Payer: Medicare HMO | Source: Ambulatory Visit | Attending: Family Medicine | Admitting: Family Medicine

## 2015-05-09 DIAGNOSIS — R928 Other abnormal and inconclusive findings on diagnostic imaging of breast: Secondary | ICD-10-CM

## 2015-05-29 ENCOUNTER — Other Ambulatory Visit: Payer: Self-pay | Admitting: Internal Medicine

## 2015-06-02 ENCOUNTER — Telehealth: Payer: Self-pay | Admitting: Family Medicine

## 2015-06-02 NOTE — Progress Notes (Signed)
bp is high--- f/u is scheduled for oct

## 2015-06-02 NOTE — Telephone Encounter (Signed)
pre visit letter mailed 06/02/15

## 2015-06-22 ENCOUNTER — Encounter: Payer: Self-pay | Admitting: Behavioral Health

## 2015-06-22 ENCOUNTER — Telehealth: Payer: Self-pay | Admitting: Behavioral Health

## 2015-06-22 NOTE — Telephone Encounter (Signed)
Pre-Visit Call completed with patient and chart updated.   Pre-Visit Info documented in Specialty Comments under [employer].    

## 2015-06-23 ENCOUNTER — Ambulatory Visit (INDEPENDENT_AMBULATORY_CARE_PROVIDER_SITE_OTHER): Payer: Medicare HMO | Admitting: Family Medicine

## 2015-06-23 ENCOUNTER — Encounter: Payer: Self-pay | Admitting: Family Medicine

## 2015-06-23 VITALS — BP 126/78 | HR 69 | Temp 98.0°F | Ht 65.0 in | Wt 126.4 lb

## 2015-06-23 DIAGNOSIS — E1151 Type 2 diabetes mellitus with diabetic peripheral angiopathy without gangrene: Secondary | ICD-10-CM

## 2015-06-23 DIAGNOSIS — E785 Hyperlipidemia, unspecified: Secondary | ICD-10-CM | POA: Diagnosis not present

## 2015-06-23 DIAGNOSIS — Z0000 Encounter for general adult medical examination without abnormal findings: Secondary | ICD-10-CM

## 2015-06-23 LAB — COMPREHENSIVE METABOLIC PANEL
ALT: 28 U/L (ref 0–35)
AST: 24 U/L (ref 0–37)
Albumin: 4.1 g/dL (ref 3.5–5.2)
Alkaline Phosphatase: 86 U/L (ref 39–117)
BUN: 17 mg/dL (ref 6–23)
CHLORIDE: 104 meq/L (ref 96–112)
CO2: 30 meq/L (ref 19–32)
CREATININE: 0.73 mg/dL (ref 0.40–1.20)
Calcium: 10.3 mg/dL (ref 8.4–10.5)
GFR: 82.89 mL/min (ref >60.00)
Glucose, Bld: 121 mg/dL — ABNORMAL HIGH (ref 70–99)
POTASSIUM: 5 meq/L (ref 3.5–5.1)
SODIUM: 141 meq/L (ref 135–145)
Total Bilirubin: 0.7 mg/dL (ref 0.2–1.2)
Total Protein: 7.5 g/dL (ref 6.0–8.3)

## 2015-06-23 LAB — POCT URINALYSIS DIPSTICK
BILIRUBIN UA: NEGATIVE
Glucose, UA: NEGATIVE
KETONES UA: NEGATIVE
Leukocytes, UA: NEGATIVE
NITRITE UA: NEGATIVE
PH UA: 6
RBC UA: NEGATIVE
Spec Grav, UA: 1.02
Urobilinogen, UA: 0.2

## 2015-06-23 LAB — CBC WITH DIFFERENTIAL/PLATELET
BASOS ABS: 0 10*3/uL (ref 0.0–0.1)
Basophils Relative: 0.5 % (ref 0.0–3.0)
Eosinophils Absolute: 0.1 10*3/uL (ref 0.0–0.7)
Eosinophils Relative: 1.5 % (ref 0.0–5.0)
HCT: 48.6 % — ABNORMAL HIGH (ref 36.0–46.0)
Hemoglobin: 15.8 g/dL — ABNORMAL HIGH (ref 12.0–15.0)
LYMPHS ABS: 2.2 10*3/uL (ref 0.7–4.0)
Lymphocytes Relative: 23.5 % (ref 12.0–46.0)
MCHC: 32.6 g/dL (ref 30.0–36.0)
MCV: 87.1 fl (ref 78.0–100.0)
MONO ABS: 0.4 10*3/uL (ref 0.1–1.0)
MONOS PCT: 4.8 % (ref 3.0–12.0)
NEUTROS ABS: 6.4 10*3/uL (ref 1.4–7.7)
NEUTROS PCT: 69.7 % (ref 43.0–77.0)
PLATELETS: 697 10*3/uL — AB (ref 150.0–400.0)
RBC: 5.58 Mil/uL — AB (ref 3.87–5.11)
RDW: 17.8 % — ABNORMAL HIGH (ref 11.5–15.5)
WBC: 9.2 10*3/uL (ref 4.0–10.5)

## 2015-06-23 LAB — LIPID PANEL
CHOL/HDL RATIO: 3
Cholesterol: 209 mg/dL — ABNORMAL HIGH (ref 0–200)
HDL: 61.1 mg/dL (ref >39.00)
LDL Cholesterol: 129 mg/dL — ABNORMAL HIGH (ref 0–99)
NONHDL: 147.67
TRIGLYCERIDES: 91 mg/dL (ref 0.0–149.0)
VLDL: 18.2 mg/dL (ref 0.0–40.0)

## 2015-06-23 LAB — TSH: TSH: 1.29 u[IU]/mL (ref 0.35–4.50)

## 2015-06-23 MED ORDER — ATORVASTATIN CALCIUM 20 MG PO TABS: 20.0000 mg | ORAL_TABLET | Freq: Every day | ORAL | Status: DC

## 2015-06-23 NOTE — Progress Notes (Signed)
Subjective:   Brandi Mccormick is a 74 y.o. female who presents for Medicare Annual (Subsequent) preventive examination.  Review of Systems:   Review of Systems  Constitutional: Negative for activity change, appetite change and fatigue.  HENT: Negative for hearing loss, congestion, tinnitus and ear discharge.   Eyes: Negative for visual disturbance (see optho q1y -- vision corrected to 20/20 with glasses).  Respiratory: Negative for cough, chest tightness and shortness of breath.   Cardiovascular: Negative for chest pain, palpitations and leg swelling.  Gastrointestinal: Negative for abdominal pain, diarrhea, constipation and abdominal distention.  Genitourinary: Negative for urgency, frequency, decreased urine volume and difficulty urinating.  Musculoskeletal: Negative for back pain, arthralgias and gait problem.  Skin: Negative for color change, pallor and rash.  Neurological: Negative for dizziness, light-headedness, numbness and headaches.  Hematological: Negative for adenopathy. Does not bruise/bleed easily.  Psychiatric/Behavioral: Negative for suicidal ideas, confusion, sleep disturbance, self-injury, dysphoric mood, decreased concentration and agitation.  Pt is able to read and write and can do all ADLs No risk for falling No abuse/ violence in home           Objective:     Vitals: BP 126/78 mmHg  Pulse 69  Temp(Src) 98 F (36.7 C) (Oral)  Ht $R'5\' 5"'dB$  (1.651 m)  Wt 126 lb 6.4 oz (57.335 kg)  BMI 21.03 kg/m2  SpO2 97% BP 126/78 mmHg  Pulse 69  Temp(Src) 98 F (36.7 C) (Oral)  Ht $R'5\' 5"'kq$  (1.651 m)  Wt 126 lb 6.4 oz (57.335 kg)  BMI 21.03 kg/m2  SpO2 97% General appearance: alert, cooperative, appears stated age and no distress Head: Normocephalic, without obvious abnormality, atraumatic Eyes: conjunctivae/corneas clear. PERRL, EOM's intact. Fundi benign. Ears: normal TM's and external ear canals both ears Nose: Nares normal. Septum midline. Mucosa normal. No  drainage or sinus tenderness. Throat: lips, mucosa, and tongue normal; teeth and gums normal Neck: no adenopathy, no carotid bruit, no JVD, supple, symmetrical, trachea midline and thyroid not enlarged, symmetric, no tenderness/mass/nodules Back: symmetric, no curvature. ROM normal. No CVA tenderness. Lungs: clear to auscultation bilaterally Breasts: normal appearance, no masses or tenderness Heart: regular rate and rhythm, S1, S2 normal, no murmur, click, rub or gallop Abdomen: soft, non-tender; bowel sounds normal; no masses,  no organomegaly Pelvic: not indicated; post-menopausal, no abnormal Pap smears in past Extremities: extremities normal, atraumatic, no cyanosis or edema Pulses: 2+ and symmetric Skin: Skin color, texture, turgor normal. No rashes or lesions Lymph nodes: Cervical, supraclavicular, and axillary nodes normal. Neurologic: Alert and oriented X 3, normal strength and tone. Normal symmetric reflexes. Normal coordination and gait Psych- no depression, no anxiety Tobacco History  Smoking status  . Never Smoker   Smokeless tobacco  . Never Used     Counseling given: Not Answered   Past Medical History  Diagnosis Date  . Diabetes ([place])   . Thyroid disease     Not on med's  . Shingles   . Pneumonia    Past Surgical History  Procedure Laterality Date  . Dental surgery     Family History  Problem Relation Age of Onset  . Diabetes Mother     and on mother's side of family  . Colon cancer Mother   . Stroke Mother   . Diabetes Father     and on father's side of family  . Diabetes Sister   . Diabetes Brother   . Diabetes Sister    History  Sexual Activity  . Sexual  Activity:  . Partners: Male    Comment: married-husband     Outpatient Encounter Prescriptions as of 06/23/2015  Medication Sig  . atorvastatin (LIPITOR) 20 MG tablet Take 1 tablet (20 mg total) by mouth daily.  Marland Kitchen glipiZIDE (GLUCOTROL XL) 5 MG 24 hr tablet TAKE ONE TABLET BY MOUTH ONCE  DAILY WITH  BREAKFAST  . glipiZIDE (GLUCOTROL XL) 5 MG 24 hr tablet Take 1 tablet before breakfast and 1/2 tablet before dinner  . glucose blood (ONETOUCH VERIO) test strip Check Blood sugar once daily. Dx: E11.9  . metFORMIN (GLUCOPHAGE-XR) 750 MG 24 hr tablet Take 1 tablet (750 mg total) by mouth daily with breakfast.  . ONETOUCH DELICA LANCETS 76L MISC USE  TO CHECK GLUCOSE TWICE DAILY  . ranitidine (ZANTAC) 150 MG tablet Take 1 tablet (150 mg total) by mouth 2 (two) times daily. (Patient taking differently: Take 150 mg by mouth at bedtime. )  . [DISCONTINUED] atorvastatin (LIPITOR) 20 MG tablet Take 1 tablet (20 mg total) by mouth daily. Repeat labs are due now for more refills.   No facility-administered encounter medications on file as of 06/23/2015.    Activities of Daily Living In your present state of health, do you have any difficulty performing the following activities: 06/23/2015 04/24/2015  Hearing? N N  Vision? N N  Difficulty concentrating or making decisions? N N  Walking or climbing stairs? N N  Dressing or bathing? N N  Doing errands, shopping? N N  Preparing Food and eating ? - N  Using the Toilet? - N  In the past six months, have you accidently leaked urine? - N  Do you have problems with loss of bowel control? - N  Managing your Medications? - N  Managing your Finances? - N  Housekeeping or managing your Housekeeping? - N    Patient Care Team: Rosalita Chessman, DO as PCP - General (Family Medicine) Philemon Kingdom, MD as Consulting Physician (Internal Medicine) Ara Kussmaul, MD as Consulting Physician (Ophthalmology)    Assessment:    CPE: Exercise Activities and Dietary recommendations--- pt doing yard work    Goals    . Have 3 meals a day     At lunch add maybe Glucerna, Smoothies with protein, Sandwiches with lean protein/veggies, etc.     . Reduce sugar intake to 25 grams per day     Trade cake and pie for fruit.        Fall Risk Fall Risk   06/23/2015 06/23/2015 04/24/2015 11/16/2013  Falls in the past year? No No No No  Risk for fall due to : - - - History of fall(s)   Depression Screen PHQ 2/9 Scores 06/23/2015 06/23/2015 04/24/2015 11/16/2013  PHQ - 2 Score 0 0 0 0     Cognitive Testing MMSE - Mini Mental State Exam 04/24/2015  Orientation to time 5  Orientation to Place 5  Registration 3  Attention/ Calculation 4  Recall 2  Language- name 2 objects 2  Language- repeat 1  Language- follow 3 step command 3  Language- read & follow direction 1  Write a sentence 1  Copy design 1  Total score 28    Immunization History  Administered Date(s) Administered  . Pneumococcal Polysaccharide-23 11/16/2013   Screening Tests Health Maintenance  Topic Date Due  . COLONOSCOPY  10/03/1991  . PNA vac Low Risk Adult (2 of 2 - PCV13) 11/17/2014  . OPHTHALMOLOGY EXAM  12/07/2014  . URINE MICROALBUMIN  02/22/2015  .  HEMOGLOBIN A1C  10/31/2015  . INFLUENZA VACCINE  04/02/2016  . FOOT EXAM  04/23/2016  . MAMMOGRAM  04/30/2017  . TETANUS/TDAP  06/22/2025  . DEXA SCAN  Addressed  . ZOSTAVAX  Addressed      Plan:    see avs During the course of the visit the patient was educated and counseled about the following appropriate screening and preventive services:   Vaccines to include Pneumoccal, Influenza, Hepatitis B, Td, Zostavax, HCV  Electrocardiogram  Cardiovascular Disease  Colorectal cancer screening  Bone density screening  Diabetes screening  Glaucoma screening  Mammography/PAP  Nutrition counseling   Patient Instructions (the written plan) was given to the patient.  1. Preventative health care See above  2. Hyperlipidemia  - atorvastatin (LIPITOR) 20 MG tablet; Take 1 tablet (20 mg total) by mouth daily.  Dispense: 90 tablet; Refill: 3 - Lipid panel - CBC with Differential/Platelet - POCT urinalysis dipstick - TSH - Comp Met (CMET)  3. DM (diabetes mellitus) type II controlled peripheral  vascular disorder (HCC) Per endo - Comp Met (CMET)  4. Routine history and physical examination of adult   Garnet Koyanagi, DO  06/23/2015

## 2015-06-23 NOTE — Patient Instructions (Signed)
Preventive Care for Adults, Female A healthy lifestyle and preventive care can promote health and wellness. Preventive health guidelines for women include the following key practices.  A routine yearly physical is a good way to check with your health care provider about your health and preventive screening. It is a chance to share any concerns and updates on your health and to receive a thorough exam.  Visit your dentist for a routine exam and preventive care every 6 months. Brush your teeth twice a day and floss once a day. Good oral hygiene prevents tooth decay and gum disease.  The frequency of eye exams is based on your age, health, family medical history, use of contact lenses, and other factors. Follow your health care provider's recommendations for frequency of eye exams.  Eat a healthy diet. Foods like vegetables, fruits, whole grains, low-fat dairy products, and lean protein foods contain the nutrients you need without too many calories. Decrease your intake of foods high in solid fats, added sugars, and salt. Eat the right amount of calories for you.Get information about a proper diet from your health care provider, if necessary.  Regular physical exercise is one of the most important things you can do for your health. Most adults should get at least 150 minutes of moderate-intensity exercise (any activity that increases your heart rate and causes you to sweat) each week. In addition, most adults need muscle-strengthening exercises on 2 or more days a week.  Maintain a healthy weight. The body mass index (BMI) is a screening tool to identify possible weight problems. It provides an estimate of body fat based on height and weight. Your health care provider can find your BMI and can help you achieve or maintain a healthy weight.For adults 20 years and older:  A BMI below 18.5 is considered underweight.  A BMI of 18.5 to 24.9 is normal.  A BMI of 25 to 29.9 is considered overweight.  A  BMI of 30 and above is considered obese.  Maintain normal blood lipids and cholesterol levels by exercising and minimizing your intake of saturated fat. Eat a balanced diet with plenty of fruit and vegetables. Blood tests for lipids and cholesterol should begin at age 45 and be repeated every 5 years. If your lipid or cholesterol levels are high, you are over 50, or you are at high risk for heart disease, you may need your cholesterol levels checked more frequently.Ongoing high lipid and cholesterol levels should be treated with medicines if diet and exercise are not working.  If you smoke, find out from your health care provider how to quit. If you do not use tobacco, do not start.  Lung cancer screening is recommended for adults aged 45-80 years who are at high risk for developing lung cancer because of a history of smoking. A yearly low-dose CT scan of the lungs is recommended for people who have at least a 30-pack-year history of smoking and are a current smoker or have quit within the past 15 years. A pack year of smoking is smoking an average of 1 pack of cigarettes a day for 1 year (for example: 1 pack a day for 30 years or 2 packs a day for 15 years). Yearly screening should continue until the smoker has stopped smoking for at least 15 years. Yearly screening should be stopped for people who develop a health problem that would prevent them from having lung cancer treatment.  If you are pregnant, do not drink alcohol. If you are  breastfeeding, be very cautious about drinking alcohol. If you are not pregnant and choose to drink alcohol, do not have more than 1 drink per day. One drink is considered to be 12 ounces (355 mL) of beer, 5 ounces (148 mL) of wine, or 1.5 ounces (44 mL) of liquor.  Avoid use of street drugs. Do not share needles with anyone. Ask for help if you need support or instructions about stopping the use of drugs.  High blood pressure causes heart disease and increases the risk  of stroke. Your blood pressure should be checked at least every 1 to 2 years. Ongoing high blood pressure should be treated with medicines if weight loss and exercise do not work.  If you are 55-79 years old, ask your health care provider if you should take aspirin to prevent strokes.  Diabetes screening is done by taking a blood sample to check your blood glucose level after you have not eaten for a certain period of time (fasting). If you are not overweight and you do not have risk factors for diabetes, you should be screened once every 3 years starting at age 45. If you are overweight or obese and you are 40-70 years of age, you should be screened for diabetes every year as part of your cardiovascular risk assessment.  Breast cancer screening is essential preventive care for women. You should practice "breast self-awareness." This means understanding the normal appearance and feel of your breasts and may include breast self-examination. Any changes detected, no matter how small, should be reported to a health care provider. Women in their 20s and 30s should have a clinical breast exam (CBE) by a health care provider as part of a regular health exam every 1 to 3 years. After age 40, women should have a CBE every year. Starting at age 40, women should consider having a mammogram (breast X-ray test) every year. Women who have a family history of breast cancer should talk to their health care provider about genetic screening. Women at a high risk of breast cancer should talk to their health care providers about having an MRI and a mammogram every year.  Breast cancer gene (BRCA)-related cancer risk assessment is recommended for women who have family members with BRCA-related cancers. BRCA-related cancers include breast, ovarian, tubal, and peritoneal cancers. Having family members with these cancers may be associated with an increased risk for harmful changes (mutations) in the breast cancer genes BRCA1 and  BRCA2. Results of the assessment will determine the need for genetic counseling and BRCA1 and BRCA2 testing.  Your health care provider may recommend that you be screened regularly for cancer of the pelvic organs (ovaries, uterus, and vagina). This screening involves a pelvic examination, including checking for microscopic changes to the surface of your cervix (Pap test). You may be encouraged to have this screening done every 3 years, beginning at age 21.  For women ages 30-65, health care providers may recommend pelvic exams and Pap testing every 3 years, or they may recommend the Pap and pelvic exam, combined with testing for human papilloma virus (HPV), every 5 years. Some types of HPV increase your risk of cervical cancer. Testing for HPV may also be done on women of any age with unclear Pap test results.  Other health care providers may not recommend any screening for nonpregnant women who are considered low risk for pelvic cancer and who do not have symptoms. Ask your health care provider if a screening pelvic exam is right for   you.  If you have had past treatment for cervical cancer or a condition that could lead to cancer, you need Pap tests and screening for cancer for at least 20 years after your treatment. If Pap tests have been discontinued, your risk factors (such as having a new sexual partner) need to be reassessed to determine if screening should resume. Some women have medical problems that increase the chance of getting cervical cancer. In these cases, your health care provider may recommend more frequent screening and Pap tests.  Colorectal cancer can be detected and often prevented. Most routine colorectal cancer screening begins at the age of 50 years and continues through age 75 years. However, your health care provider may recommend screening at an earlier age if you have risk factors for colon cancer. On a yearly basis, your health care provider may provide home test kits to check  for hidden blood in the stool. Use of a small camera at the end of a tube, to directly examine the colon (sigmoidoscopy or colonoscopy), can detect the earliest forms of colorectal cancer. Talk to your health care provider about this at age 50, when routine screening begins. Direct exam of the colon should be repeated every 5-10 years through age 75 years, unless early forms of precancerous polyps or small growths are found.  People who are at an increased risk for hepatitis B should be screened for this virus. You are considered at high risk for hepatitis B if:  You were born in a country where hepatitis B occurs often. Talk with your health care provider about which countries are considered high risk.  Your parents were born in a high-risk country and you have not received a shot to protect against hepatitis B (hepatitis B vaccine).  You have HIV or AIDS.  You use needles to inject street drugs.  You live with, or have sex with, someone who has hepatitis B.  You get hemodialysis treatment.  You take certain medicines for conditions like cancer, organ transplantation, and autoimmune conditions.  Hepatitis C blood testing is recommended for all people born from 1945 through 1965 and any individual with known risks for hepatitis C.  Practice safe sex. Use condoms and avoid high-risk sexual practices to reduce the spread of sexually transmitted infections (STIs). STIs include gonorrhea, chlamydia, syphilis, trichomonas, herpes, HPV, and human immunodeficiency virus (HIV). Herpes, HIV, and HPV are viral illnesses that have no cure. They can result in disability, cancer, and death.  You should be screened for sexually transmitted illnesses (STIs) including gonorrhea and chlamydia if:  You are sexually active and are younger than 24 years.  You are older than 24 years and your health care provider tells you that you are at risk for this type of infection.  Your sexual activity has changed  since you were last screened and you are at an increased risk for chlamydia or gonorrhea. Ask your health care provider if you are at risk.  If you are at risk of being infected with HIV, it is recommended that you take a prescription medicine daily to prevent HIV infection. This is called preexposure prophylaxis (PrEP). You are considered at risk if:  You are sexually active and do not regularly use condoms or know the HIV status of your partner(s).  You take drugs by injection.  You are sexually active with a partner who has HIV.  Talk with your health care provider about whether you are at high risk of being infected with HIV. If   you choose to begin PrEP, you should first be tested for HIV. You should then be tested every 3 months for as long as you are taking PrEP.  Osteoporosis is a disease in which the bones lose minerals and strength with aging. This can result in serious bone fractures or breaks. The risk of osteoporosis can be identified using a bone density scan. Women ages 67 years and over and women at risk for fractures or osteoporosis should discuss screening with their health care providers. Ask your health care provider whether you should take a calcium supplement or vitamin D to reduce the rate of osteoporosis.  Menopause can be associated with physical symptoms and risks. Hormone replacement therapy is available to decrease symptoms and risks. You should talk to your health care provider about whether hormone replacement therapy is right for you.  Use sunscreen. Apply sunscreen liberally and repeatedly throughout the day. You should seek shade when your shadow is shorter than you. Protect yourself by wearing long sleeves, pants, a wide-brimmed hat, and sunglasses year round, whenever you are outdoors.  Once a month, do a whole body skin exam, using a mirror to look at the skin on your back. Tell your health care provider of new moles, moles that have irregular borders, moles that  are larger than a pencil eraser, or moles that have changed in shape or color.  Stay current with required vaccines (immunizations).  Influenza vaccine. All adults should be immunized every year.  Tetanus, diphtheria, and acellular pertussis (Td, Tdap) vaccine. Pregnant women should receive 1 dose of Tdap vaccine during each pregnancy. The dose should be obtained regardless of the length of time since the last dose. Immunization is preferred during the 27th-36th week of gestation. An adult who has not previously received Tdap or who does not know her vaccine status should receive 1 dose of Tdap. This initial dose should be followed by tetanus and diphtheria toxoids (Td) booster doses every 10 years. Adults with an unknown or incomplete history of completing a 3-dose immunization series with Td-containing vaccines should begin or complete a primary immunization series including a Tdap dose. Adults should receive a Td booster every 10 years.  Varicella vaccine. An adult without evidence of immunity to varicella should receive 2 doses or a second dose if she has previously received 1 dose. Pregnant females who do not have evidence of immunity should receive the first dose after pregnancy. This first dose should be obtained before leaving the health care facility. The second dose should be obtained 4-8 weeks after the first dose.  Human papillomavirus (HPV) vaccine. Females aged 13-26 years who have not received the vaccine previously should obtain the 3-dose series. The vaccine is not recommended for use in pregnant females. However, pregnancy testing is not needed before receiving a dose. If a female is found to be pregnant after receiving a dose, no treatment is needed. In that case, the remaining doses should be delayed until after the pregnancy. Immunization is recommended for any person with an immunocompromised condition through the age of 61 years if she did not get any or all doses earlier. During the  3-dose series, the second dose should be obtained 4-8 weeks after the first dose. The third dose should be obtained 24 weeks after the first dose and 16 weeks after the second dose.  Zoster vaccine. One dose is recommended for adults aged 30 years or older unless certain conditions are present.  Measles, mumps, and rubella (MMR) vaccine. Adults born  before 1957 generally are considered immune to measles and mumps. Adults born in 1957 or later should have 1 or more doses of MMR vaccine unless there is a contraindication to the vaccine or there is laboratory evidence of immunity to each of the three diseases. A routine second dose of MMR vaccine should be obtained at least 28 days after the first dose for students attending postsecondary schools, health care workers, or international travelers. People who received inactivated measles vaccine or an unknown type of measles vaccine during 1963-1967 should receive 2 doses of MMR vaccine. People who received inactivated mumps vaccine or an unknown type of mumps vaccine before 1979 and are at high risk for mumps infection should consider immunization with 2 doses of MMR vaccine. For females of childbearing age, rubella immunity should be determined. If there is no evidence of immunity, females who are not pregnant should be vaccinated. If there is no evidence of immunity, females who are pregnant should delay immunization until after pregnancy. Unvaccinated health care workers born before 1957 who lack laboratory evidence of measles, mumps, or rubella immunity or laboratory confirmation of disease should consider measles and mumps immunization with 2 doses of MMR vaccine or rubella immunization with 1 dose of MMR vaccine.  Pneumococcal 13-valent conjugate (PCV13) vaccine. When indicated, a person who is uncertain of his immunization history and has no record of immunization should receive the PCV13 vaccine. All adults 65 years of age and older should receive this  vaccine. An adult aged 19 years or older who has certain medical conditions and has not been previously immunized should receive 1 dose of PCV13 vaccine. This PCV13 should be followed with a dose of pneumococcal polysaccharide (PPSV23) vaccine. Adults who are at high risk for pneumococcal disease should obtain the PPSV23 vaccine at least 8 weeks after the dose of PCV13 vaccine. Adults older than 73 years of age who have normal immune system function should obtain the PPSV23 vaccine dose at least 1 year after the dose of PCV13 vaccine.  Pneumococcal polysaccharide (PPSV23) vaccine. When PCV13 is also indicated, PCV13 should be obtained first. All adults aged 65 years and older should be immunized. An adult younger than age 65 years who has certain medical conditions should be immunized. Any person who resides in a nursing home or long-term care facility should be immunized. An adult smoker should be immunized. People with an immunocompromised condition and certain other conditions should receive both PCV13 and PPSV23 vaccines. People with human immunodeficiency virus (HIV) infection should be immunized as soon as possible after diagnosis. Immunization during chemotherapy or radiation therapy should be avoided. Routine use of PPSV23 vaccine is not recommended for American Indians, [place] Natives, or people younger than 65 years unless there are medical conditions that require PPSV23 vaccine. When indicated, people who have unknown immunization and have no record of immunization should receive PPSV23 vaccine. One-time revaccination 5 years after the first dose of PPSV23 is recommended for people aged 19-64 years who have chronic kidney failure, nephrotic syndrome, asplenia, or immunocompromised conditions. People who received 1-2 doses of PPSV23 before age 65 years should receive another dose of PPSV23 vaccine at age 65 years or later if at least 5 years have passed since the previous dose. Doses of PPSV23 are not  needed for people immunized with PPSV23 at or after age 65 years.  Meningococcal vaccine. Adults with asplenia or persistent complement component deficiencies should receive 2 doses of quadrivalent meningococcal conjugate (MenACWY-D) vaccine. The doses should be obtained   at least 2 months apart. Microbiologists working with certain meningococcal bacteria, [place] recruits, people at risk during an outbreak, and people who travel to or live in countries with a high rate of meningitis should be immunized. A first-year college student up through age 34 years who is living in a residence hall should receive a dose if she did not receive a dose on or after her 16th birthday. Adults who have certain high-risk conditions should receive one or more doses of vaccine.  Hepatitis A vaccine. Adults who wish to be protected from this disease, have certain high-risk conditions, work with hepatitis A-infected animals, work in hepatitis A research labs, or travel to or work in countries with a high rate of hepatitis A should be immunized. Adults who were previously unvaccinated and who anticipate close contact with an international adoptee during the first 60 days after arrival in the [place] from a country with a high rate of hepatitis A should be immunized.  Hepatitis B vaccine. Adults who wish to be protected from this disease, have certain high-risk conditions, may be exposed to blood or other infectious body fluids, are household contacts or sex partners of hepatitis B positive people, are clients or workers in certain care facilities, or travel to or work in countries with a high rate of hepatitis B should be immunized.  Haemophilus influenzae type b (Hib) vaccine. A previously unvaccinated person with asplenia or sickle cell disease or having a scheduled splenectomy should receive 1 dose of Hib vaccine. Regardless of previous immunization, a recipient of a hematopoietic stem cell transplant should receive a  3-dose series 6-12 months after her successful transplant. Hib vaccine is not recommended for adults with HIV infection. Preventive Services / Frequency Ages 35 to 4 years  Blood pressure check.** / Every 3-5 years.  Lipid and cholesterol check.** / Every 5 years beginning at age 60.  Clinical breast exam.** / Every 3 years for women in their 71s and 10s.  BRCA-related cancer risk assessment.** / For women who have family members with a BRCA-related cancer (breast, ovarian, tubal, or peritoneal cancers).  Pap test.** / Every 2 years from ages 76 through 26. Every 3 years starting at age 61 through age 76 or 93 with a history of 3 consecutive normal Pap tests.  HPV screening.** / Every 3 years from ages 37 through ages 60 to 51 with a history of 3 consecutive normal Pap tests.  Hepatitis C blood test.** / For any individual with known risks for hepatitis C.  Skin self-exam. / Monthly.  Influenza vaccine. / Every year.  Tetanus, diphtheria, and acellular pertussis (Tdap, Td) vaccine.** / Consult your health care provider. Pregnant women should receive 1 dose of Tdap vaccine during each pregnancy. 1 dose of Td every 10 years.  Varicella vaccine.** / Consult your health care provider. Pregnant females who do not have evidence of immunity should receive the first dose after pregnancy.  HPV vaccine. / 3 doses over 6 months, if 93 and younger. The vaccine is not recommended for use in pregnant females. However, pregnancy testing is not needed before receiving a dose.  Measles, mumps, rubella (MMR) vaccine.** / You need at least 1 dose of MMR if you were born in 1957 or later. You may also need a 2nd dose. For females of childbearing age, rubella immunity should be determined. If there is no evidence of immunity, females who are not pregnant should be vaccinated. If there is no evidence of immunity, females who are  pregnant should delay immunization until after pregnancy.  Pneumococcal  13-valent conjugate (PCV13) vaccine.** / Consult your health care provider.  Pneumococcal polysaccharide (PPSV23) vaccine.** / 1 to 2 doses if you smoke cigarettes or if you have certain conditions.  Meningococcal vaccine.** / 1 dose if you are age 68 to 8 years and a Market researcher living in a residence hall, or have one of several medical conditions, you need to get vaccinated against meningococcal disease. You may also need additional booster doses.  Hepatitis A vaccine.** / Consult your health care provider.  Hepatitis B vaccine.** / Consult your health care provider.  Haemophilus influenzae type b (Hib) vaccine.** / Consult your health care provider. Ages 7 to 53 years  Blood pressure check.** / Every year.  Lipid and cholesterol check.** / Every 5 years beginning at age 25 years.  Lung cancer screening. / Every year if you are aged 11-80 years and have a 30-pack-year history of smoking and currently smoke or have quit within the past 15 years. Yearly screening is stopped once you have quit smoking for at least 15 years or develop a health problem that would prevent you from having lung cancer treatment.  Clinical breast exam.** / Every year after age 48 years.  BRCA-related cancer risk assessment.** / For women who have family members with a BRCA-related cancer (breast, ovarian, tubal, or peritoneal cancers).  Mammogram.** / Every year beginning at age 41 years and continuing for as long as you are in good health. Consult with your health care provider.  Pap test.** / Every 3 years starting at age 65 years through age 37 or 70 years with a history of 3 consecutive normal Pap tests.  HPV screening.** / Every 3 years from ages 72 years through ages 60 to 40 years with a history of 3 consecutive normal Pap tests.  Fecal occult blood test (FOBT) of stool. / Every year beginning at age 21 years and continuing until age 5 years. You may not need to do this test if you get  a colonoscopy every 10 years.  Flexible sigmoidoscopy or colonoscopy.** / Every 5 years for a flexible sigmoidoscopy or every 10 years for a colonoscopy beginning at age 35 years and continuing until age 48 years.  Hepatitis C blood test.** / For all people born from [birth year] through 1965 and any individual with known risks for hepatitis C.  Skin self-exam. / Monthly.  Influenza vaccine. / Every year.  Tetanus, diphtheria, and acellular pertussis (Tdap/Td) vaccine.** / Consult your health care provider. Pregnant women should receive 1 dose of Tdap vaccine during each pregnancy. 1 dose of Td every 10 years.  Varicella vaccine.** / Consult your health care provider. Pregnant females who do not have evidence of immunity should receive the first dose after pregnancy.  Zoster vaccine.** / 1 dose for adults aged 30 years or older.  Measles, mumps, rubella (MMR) vaccine.** / You need at least 1 dose of MMR if you were born in 1957 or later. You may also need a second dose. For females of childbearing age, rubella immunity should be determined. If there is no evidence of immunity, females who are not pregnant should be vaccinated. If there is no evidence of immunity, females who are pregnant should delay immunization until after pregnancy.  Pneumococcal 13-valent conjugate (PCV13) vaccine.** / Consult your health care provider.  Pneumococcal polysaccharide (PPSV23) vaccine.** / 1 to 2 doses if you smoke cigarettes or if you have certain conditions.  Meningococcal vaccine.** /  Consult your health care provider.  Hepatitis A vaccine.** / Consult your health care provider.  Hepatitis B vaccine.** / Consult your health care provider.  Haemophilus influenzae type b (Hib) vaccine.** / Consult your health care provider. Ages 64 years and over  Blood pressure check.** / Every year.  Lipid and cholesterol check.** / Every 5 years beginning at age 23 years.  Lung cancer screening. / Every year if you  are aged 16-80 years and have a 30-pack-year history of smoking and currently smoke or have quit within the past 15 years. Yearly screening is stopped once you have quit smoking for at least 15 years or develop a health problem that would prevent you from having lung cancer treatment.  Clinical breast exam.** / Every year after age 74 years.  BRCA-related cancer risk assessment.** / For women who have family members with a BRCA-related cancer (breast, ovarian, tubal, or peritoneal cancers).  Mammogram.** / Every year beginning at age 44 years and continuing for as long as you are in good health. Consult with your health care provider.  Pap test.** / Every 3 years starting at age 58 years through age 22 or 39 years with 3 consecutive normal Pap tests. Testing can be stopped between 65 and 70 years with 3 consecutive normal Pap tests and no abnormal Pap or HPV tests in the past 10 years.  HPV screening.** / Every 3 years from ages 64 years through ages 70 or 61 years with a history of 3 consecutive normal Pap tests. Testing can be stopped between 65 and 70 years with 3 consecutive normal Pap tests and no abnormal Pap or HPV tests in the past 10 years.  Fecal occult blood test (FOBT) of stool. / Every year beginning at age 40 years and continuing until age 27 years. You may not need to do this test if you get a colonoscopy every 10 years.  Flexible sigmoidoscopy or colonoscopy.** / Every 5 years for a flexible sigmoidoscopy or every 10 years for a colonoscopy beginning at age 7 years and continuing until age 32 years.  Hepatitis C blood test.** / For all people born from [birth year] through 1964 and any individual with known risks for hepatitis C.  Osteoporosis screening.** / A one-time screening for women ages 30 years and over and women at risk for fractures or osteoporosis.  Skin self-exam. / Monthly.  Influenza vaccine. / Every year.  Tetanus, diphtheria, and acellular pertussis (Tdap/Td)  vaccine.** / 1 dose of Td every 10 years.  Varicella vaccine.** / Consult your health care provider.  Zoster vaccine.** / 1 dose for adults aged 35 years or older.  Pneumococcal 13-valent conjugate (PCV13) vaccine.** / Consult your health care provider.  Pneumococcal polysaccharide (PPSV23) vaccine.** / 1 dose for all adults aged 46 years and older.  Meningococcal vaccine.** / Consult your health care provider.  Hepatitis A vaccine.** / Consult your health care provider.  Hepatitis B vaccine.** / Consult your health care provider.  Haemophilus influenzae type b (Hib) vaccine.** / Consult your health care provider. ** Family history and personal history of risk and conditions may change your health care provider's recommendations.   This information is not intended to replace advice given to you by your health care provider. Make sure you discuss any questions you have with your health care provider.   Document Released: 10/15/2001 Document Revised: 09/09/2014 Document Reviewed: 01/14/2011 Elsevier Interactive Patient Education [employer].

## 2015-06-23 NOTE — Progress Notes (Signed)
Pre visit review using our clinic review tool, if applicable. No additional management support is needed unless otherwise documented below in the visit note. 

## 2015-06-24 ENCOUNTER — Other Ambulatory Visit: Payer: Self-pay | Admitting: Internal Medicine

## 2015-06-26 ENCOUNTER — Telehealth: Payer: Self-pay

## 2015-06-26 NOTE — Telephone Encounter (Signed)
Completed Cologuard request via the portal.     KP

## 2015-06-26 NOTE — Telephone Encounter (Signed)
-----   Message from Rosalita Chessman, DO sent at 06/23/2015  9:04 AM EDT ----- Pt will do cologuard

## 2015-07-12 DIAGNOSIS — E785 Hyperlipidemia, unspecified: Secondary | ICD-10-CM

## 2015-07-12 MED ORDER — ATORVASTATIN CALCIUM 40 MG PO TABS: 40.0000 mg | ORAL_TABLET | Freq: Every day | ORAL | Status: DC

## 2015-08-29 ENCOUNTER — Ambulatory Visit: Payer: Medicare HMO | Admitting: Internal Medicine

## 2015-08-29 DIAGNOSIS — R69 Illness, unspecified: Secondary | ICD-10-CM | POA: Diagnosis not present

## 2015-08-30 ENCOUNTER — Encounter: Payer: Self-pay | Admitting: Internal Medicine

## 2015-08-30 ENCOUNTER — Ambulatory Visit (INDEPENDENT_AMBULATORY_CARE_PROVIDER_SITE_OTHER): Payer: Medicare HMO | Admitting: Internal Medicine

## 2015-08-30 VITALS — BP 130/70 | HR 84 | Temp 97.9°F | Wt 130.0 lb

## 2015-08-30 DIAGNOSIS — Z8639 Personal history of other endocrine, nutritional and metabolic disease: Secondary | ICD-10-CM

## 2015-08-30 DIAGNOSIS — R69 Illness, unspecified: Secondary | ICD-10-CM | POA: Diagnosis not present

## 2015-08-30 DIAGNOSIS — E119 Type 2 diabetes mellitus without complications: Secondary | ICD-10-CM

## 2015-08-30 LAB — POCT GLYCOSYLATED HEMOGLOBIN (HGB A1C): Hemoglobin A1C: 6.9

## 2015-08-30 NOTE — Patient Instructions (Signed)
Patient Instructions  Please continue: - Metformin ER 750 mg in am - Glipizide XL 5 mg in am - Glipizide XL 1/2 of a 5 mg tablet before dinner.   Please return in 3-4 month with your sugar log.

## 2015-08-30 NOTE — Progress Notes (Signed)
Patient ID: Brandi Mccormick, female   DOB: [birth year], 74 y.o.   MRN: [id number]  HPI: Brandi Mccormick is a 74 y.o.-year-old female, returning for f/u for DM2, dx 2013, non-insulin-dependent, uncontrolled, without complications and h/o hyperthyroidism. Last visit 4 mo ago.   She has a h/o hyperthyroidism, tx with meds ~20 years ago. No other history available.  She is asymptomatic. Denies palpitations, tremors, heat intolerance, weight loss, hyperdefecation.  Recent labs reviewed - normal at last check: Lab Results  Component Value Date   TSH 1.29 06/23/2015   TSH 1.44 10/18/2014   TSH 0.93 08/15/2014   TSH 0.44 02/17/2014   TSH 0.28* 12/20/2013   FREET4 0.91 08/15/2014   FREET4 1.00 02/17/2014   FREET4 0.97 12/20/2013   DM2:  Last hemoglobin A1c greatly improved in last year: Lab Results  Component Value Date   HGBA1C 6.9 08/30/2015   HGBA1C 6.7 05/02/2015   HGBA1C 6.6* 12/27/2014   Pt is on a regimen of: - Metformin ER 750 mg in am - Glipizide XL 5 mg in am - Glipizide XL 1/2 of a 5 mg tablet before dinner.  We tried Tradjenta 5 mg daily in am - too expensive. She was getting diarrhea from Metformin >> now Metformin XR 750 mg in am >> tolerates it well.  Pt now checks her sugars - 2x a day and they are better: - am: 95-194 >> 163-247 >> 97-115 >> 113-176 >> 94-176, 210x1 >> 91-163, 175 >> 107-147 >> 112-160 - 2h after b'fast: 144,160-280 >> 151, 157, 170 >> n/c >> 93-175, 214 >> 175, 181 >> 161 >> 127-136 >> 107-150 - before lunch: n/c >> 178, 184 >> 187 >> n/c >> 76-92 >> 79 >> 96-149 >> 142-176 >>102-163, 176 (snack) >> 134 - 2h after lunch: 105, 173 >> 191-270 >> 117-120 >> n/c >> 126, 172 >> 154, 278 (pound cake) >> 78-163 >> 96-127 - before dinner: n/c >> 70, 101, 149 >> n/c >> 108-174 >> 108-177 >> 78, 106-141 >> 85-136, 154 >> 109-148, 174 - 2h after dinner: 158-225 >> n/c >> 99-196, 271x1 >> 148, 168 >> 125, 153, 201, 226, 288 >> 149, 170 >> 114-150, 170 - bedtime:  86, 111, 142, 195 >> 285, 313 >> 117-120 >> 70, 111-190 >> 117-173, 205 x1 >> n/c >> 133, 160 - nighttime: n/c >> 161 >> n/c No lows; had 1 CBG at 79 >> 78 >> 96  she has hypoglycemia awareness. Highest sugar was  289 x1 >> 313 x1 >> <150 >> 271x1 >> 201x1 >> 288 >> 170s >> 170  Meter: OneTouch Verio.  Pt's meals are: - Breakfast: egg, coffee, juice; sometime - Lunch: No Lunch - Dinner: meat + vegetables - Snacks: 1-2  - no CKD, last BUN/creatinine:  Lab Results  Component Value Date   BUN 17 06/23/2015   CREATININE 0.73 06/23/2015  ACR 8 on 11/16/2013. - last set of lipids: Lab Results  Component Value Date   CHOL 209* 06/23/2015   HDL 61.10 06/23/2015   LDLCALC 129* 06/23/2015   TRIG 91.0 06/23/2015   CHOLHDL 3 06/23/2015  On Simvastatin. - last eye exam was in 12/06/2013. No DR.  - no numbness and tingling in her feet.  ROS: Constitutional: no weight gain/loss, no fatigue, no subjective hyperthermia/hypothermia Eyes: no blurry vision, no xerophthalmia ENT: no sore throat, no nodules palpated in throat, no dysphagia/odynophagia, no hoarseness Cardiovascular: no CP/SOB/palpitations/leg swelling Respiratory: no cough/SOB Gastrointestinal: no N/V/D/C Musculoskeletal: no muscle/joint aches  Skin: no rashes Neurological: no tremors/numbness/tingling/dizziness  I reviewed pt's medications, allergies, PMH, social hx, family hx, and changes were documented in the history of present illness. Otherwise, unchanged from my initial visit note.  PE: BP 130/70 mmHg  Pulse 84  Temp(Src) 97.9 F (36.6 C)  Wt 130 lb (58.968 kg) Wt Readings from Last 3 Encounters:  08/30/15 130 lb (58.968 kg)  06/23/15 126 lb 6.4 oz (57.335 kg)  05/02/15 125 lb (56.7 kg)   Constitutional: thin, in NAD Eyes: PERRLA, EOMI, no exophthalmos ENT: moist mucous membranes, no thyromegaly, no cervical lymphadenopathy Cardiovascular: tachycardia, RR, No MRG Respiratory: CTA B Gastrointestinal: abdomen  soft, NT, ND, BS+ Musculoskeletal: no deformities, strength intact in all 4 Skin: moist, warm, no rashes Neurological: no tremor with outstretched hands, DTR normal in all 4  ASSESSMENT: 1. DM2, non-insulin-dependent, uncontrolled, without complications  2. H/o Hyperthyroidism - 20 years ago - was on meds, cannot remember name  PLAN:  1. Patient with fairly recently dx'ed diabetes, with now good control, on oral antidiabetic regimen, unchanged since last visit. Sugars were high after dinner in the past >> we added 1/2 of a glipizide XL tablet before dinner >> sugars better after dinner. She does not take this second glipizide every day, that is why it is not on her med list. - I suggested to continue current regimen:  Patient Instructions  Please continue: - Metformin ER 750 mg in am - Glipizide XL 5 mg in am - Glipizide XL 1/2 of a 5 mg tablet before dinner.   Please return in 3-4 month with your sugar log.   - continue checking sugars at different times of the day - check 1-2 times a day, rotating checks - needs a new eye exam >> advised to schedule - refuses flu shot today (son got sick after it) - will check HbA1c today >> 6.9%! - Return to clinic in 3-4 mo with sugar log   2. H/o hyperthyroidism - appears euthyroid - last TFTs normal 1 mo ago >> will not repeat

## 2015-09-01 ENCOUNTER — Ambulatory Visit: Payer: Medicare HMO | Admitting: Internal Medicine

## 2015-09-14 DIAGNOSIS — R69 Illness, unspecified: Secondary | ICD-10-CM | POA: Diagnosis not present

## 2015-09-15 ENCOUNTER — Telehealth: Payer: Self-pay | Admitting: Internal Medicine

## 2015-09-15 NOTE — Telephone Encounter (Signed)
Patient stated the she is having a problem with her big toe, she is scared she may need it amputated. please advise

## 2015-09-15 NOTE — Telephone Encounter (Signed)
Returned pt's call. Pt was not at home. Left message for her to return call.

## 2015-09-18 NOTE — Telephone Encounter (Signed)
Her toe nail is brownish and looks like it is ingrown perhaps. Does not have pain

## 2015-09-19 NOTE — Telephone Encounter (Signed)
Please read messages below and advise.

## 2015-09-19 NOTE — Telephone Encounter (Signed)
She will probably need to be seen - can she check with her PCP as I have a super full schedule this pm and tomorrow.

## 2015-09-19 NOTE — Telephone Encounter (Signed)
Called pt and advised her per Dr Arman Filter message. Pt voiced understanding, will call PCP, Dr Etter Sjogren.

## 2015-09-22 ENCOUNTER — Ambulatory Visit (INDEPENDENT_AMBULATORY_CARE_PROVIDER_SITE_OTHER): Payer: Medicare HMO | Admitting: Family Medicine

## 2015-09-22 ENCOUNTER — Encounter: Payer: Self-pay | Admitting: Family Medicine

## 2015-09-22 VITALS — BP 158/92 | HR 83 | Temp 98.3°F | Ht 65.0 in | Wt 130.2 lb

## 2015-09-22 DIAGNOSIS — E1151 Type 2 diabetes mellitus with diabetic peripheral angiopathy without gangrene: Secondary | ICD-10-CM

## 2015-09-22 DIAGNOSIS — E1165 Type 2 diabetes mellitus with hyperglycemia: Secondary | ICD-10-CM

## 2015-09-22 DIAGNOSIS — I10 Essential (primary) hypertension: Secondary | ICD-10-CM | POA: Diagnosis not present

## 2015-09-22 DIAGNOSIS — IMO0002 Reserved for concepts with insufficient information to code with codable children: Secondary | ICD-10-CM

## 2015-09-22 MED ORDER — AMLODIPINE BESYLATE 5 MG PO TABS: 5.0000 mg | ORAL_TABLET | Freq: Every day | ORAL | Status: DC

## 2015-09-22 NOTE — Patient Instructions (Signed)
Hypertension Hypertension, commonly called high blood pressure, is when the force of blood pumping through your arteries is too strong. Your arteries are the blood vessels that carry blood from your heart throughout your body. A blood pressure reading consists of a higher number over a lower number, such as 110/72. The higher number (systolic) is the pressure inside your arteries when your heart pumps. The lower number (diastolic) is the pressure inside your arteries when your heart relaxes. Ideally you want your blood pressure below 120/80. Hypertension forces your heart to work harder to pump blood. Your arteries may become narrow or stiff. Having untreated or uncontrolled hypertension can cause heart attack, stroke, kidney disease, and other problems. RISK FACTORS Some risk factors for high blood pressure are controllable. Others are not.  Risk factors you cannot control include:   Race. You may be at higher risk if you are African American.  Age. Risk increases with age.  Gender. Men are at higher risk than women before age 45 years. After age 65, women are at higher risk than men. Risk factors you can control include:  Not getting enough exercise or physical activity.  Being overweight.  Getting too much fat, sugar, calories, or salt in your diet.  Drinking too much alcohol. SIGNS AND SYMPTOMS Hypertension does not usually cause signs or symptoms. Extremely high blood pressure (hypertensive crisis) may cause headache, anxiety, shortness of breath, and nosebleed. DIAGNOSIS To check if you have hypertension, your health care provider will measure your blood pressure while you are seated, with your arm held at the level of your heart. It should be measured at least twice using the same arm. Certain conditions can cause a difference in blood pressure between your right and left arms. A blood pressure reading that is higher than normal on one occasion does not mean that you need treatment. If  it is not clear whether you have high blood pressure, you may be asked to return on a different day to have your blood pressure checked again. Or, you may be asked to monitor your blood pressure at home for 1 or more weeks. TREATMENT Treating high blood pressure includes making lifestyle changes and possibly taking medicine. Living a healthy lifestyle can help lower high blood pressure. You may need to change some of your habits. Lifestyle changes may include:  Following the DASH diet. This diet is high in fruits, vegetables, and whole grains. It is low in salt, red meat, and added sugars.  Keep your sodium intake below 2,300 mg per day.  Getting at least 30-45 minutes of aerobic exercise at least 4 times per week.  Losing weight if necessary.  Not smoking.  Limiting alcoholic beverages.  Learning ways to reduce stress. Your health care provider may prescribe medicine if lifestyle changes are not enough to get your blood pressure under control, and if one of the following is true:  You are 18-59 years of age and your systolic blood pressure is above 140.  You are 60 years of age or older, and your systolic blood pressure is above 150.  Your diastolic blood pressure is above 90.  You have diabetes, and your systolic blood pressure is over 140 or your diastolic blood pressure is over 90.  You have kidney disease and your blood pressure is above 140/90.  You have heart disease and your blood pressure is above 140/90. Your personal target blood pressure may vary depending on your medical conditions, your age, and other factors. HOME CARE INSTRUCTIONS    Have your blood pressure rechecked as directed by your health care provider.   Take medicines only as directed by your health care provider. Follow the directions carefully. Blood pressure medicines must be taken as prescribed. The medicine does not work as well when you skip doses. Skipping doses also puts you at risk for  problems.  Do not smoke.   Monitor your blood pressure at home as directed by your health care provider. SEEK MEDICAL CARE IF:   You think you are having a reaction to medicines taken.  You have recurrent headaches or feel dizzy.  You have swelling in your ankles.  You have trouble with your vision. SEEK IMMEDIATE MEDICAL CARE IF:  You develop a severe headache or confusion.  You have unusual weakness, numbness, or feel faint.  You have severe chest or abdominal pain.  You vomit repeatedly.  You have trouble breathing. MAKE SURE YOU:   Understand these instructions.  Will watch your condition.  Will get help right away if you are not doing well or get worse.   This information is not intended to replace advice given to you by your health care provider. Make sure you discuss any questions you have with your health care provider.   Document Released: 08/19/2005 Document Revised: 01/03/2015 Document Reviewed: 06/11/2013 Elsevier Interactive Patient Education 2016 [employer]  

## 2015-09-22 NOTE — Progress Notes (Signed)
Patient ID: Brandi Mccormick, female    DOB: 1941-07-18  Age: 75 y.o. MRN: [id number]    Subjective:  Subjective HPI Brandi Mccormick presents c/o discoloration of toenails and she is concerned because her family member died when she had gangrene of toe secondary diabetes.  .  No other symptoms.  Review of Systems  Constitutional: Negative for diaphoresis, appetite change, fatigue and unexpected weight change.  Eyes: Negative for pain, redness and visual disturbance.  Respiratory: Negative for cough, chest tightness, shortness of breath and wheezing.   Cardiovascular: Negative for chest pain, palpitations and leg swelling.  Endocrine: Negative for cold intolerance, heat intolerance, polydipsia, polyphagia and polyuria.  Genitourinary: Negative for dysuria, frequency and difficulty urinating.  Neurological: Negative for dizziness, light-headedness, numbness and headaches.    History Past Medical History  Diagnosis Date  . Diabetes ([place])   . Thyroid disease     Not on med's  . Shingles   . Pneumonia     She has past surgical history that includes Dental surgery.   Her family history includes Colon cancer in her mother; Diabetes in her brother, father, mother, sister, and sister; Stroke in her mother.She reports that she has never smoked. She has never used smokeless tobacco. She reports that she drinks alcohol. She reports that she does not use illicit drugs.  Current Outpatient Prescriptions on File Prior to Visit  Medication Sig Dispense Refill  . atorvastatin (LIPITOR) 40 MG tablet Take 1 tablet (40 mg total) by mouth daily. 90 tablet 1  . glipiZIDE (GLUCOTROL XL) 5 MG 24 hr tablet TAKE ONE TABLET BY MOUTH ONCE DAILY WITH  BREAKFAST 60 tablet 1  . glucose blood (ONETOUCH VERIO) test strip Check Blood sugar once daily. Dx: E11.9 100 each 3  . metFORMIN (GLUCOPHAGE-XR) 750 MG 24 hr tablet Take 1 tablet (750 mg total) by mouth daily with breakfast. 90 tablet 1  . ONETOUCH DELICA  LANCETS 99991111 MISC USE  TO CHECK GLUCOSE TWICE DAILY 200 each 5  . ranitidine (ZANTAC) 150 MG tablet Take 1 tablet (150 mg total) by mouth 2 (two) times daily. (Patient taking differently: Take 150 mg by mouth at bedtime. ) 60 tablet 1   No current facility-administered medications on file prior to visit.     Objective:  Objective Physical Exam  Constitutional: She is oriented to person, place, and time. She appears well-developed and well-nourished. No distress.  HENT:  Head: Normocephalic and atraumatic.  Right Ear: External ear normal.  Left Ear: External ear normal.  Nose: Nose normal.  Mouth/Throat: Oropharynx is clear and moist.  Eyes: Conjunctivae and EOM are normal. Pupils are equal, round, and reactive to light.  Neck: Normal range of motion. Neck supple. No JVD present. Carotid bruit is not present. No thyromegaly present.  Cardiovascular: Normal rate, regular rhythm and normal heart sounds.   No murmur heard. Pulmonary/Chest: Effort normal and breath sounds normal. No respiratory distress. She has no wheezes. She has no rales. She exhibits no tenderness.  Musculoskeletal: She exhibits no edema.  Neurological: She is alert and oriented to person, place, and time.  Skin: No cyanosis. Nails show no clubbing.     Psychiatric: She has a normal mood and affect. Her behavior is normal. Judgment and thought content normal.  Nursing note and vitals reviewed.  BP 158/92 mmHg  Pulse 83  Temp(Src) 98.3 F (36.8 C) (Oral)  Ht 5\' 5"  (1.651 m)  Wt 130 lb 3.2 oz (59.058 kg)  BMI 21.67  kg/m2  SpO2 98% Wt Readings from Last 3 Encounters:  09/22/15 130 lb 3.2 oz (59.058 kg)  08/30/15 130 lb (58.968 kg)  06/23/15 126 lb 6.4 oz (57.335 kg)     Lab Results  Component Value Date   WBC 9.2 06/23/2015   HGB 15.8* 06/23/2015   HCT 48.6* 06/23/2015   PLT 697.0* 06/23/2015   GLUCOSE 121* 06/23/2015   CHOL 209* 06/23/2015   TRIG 91.0 06/23/2015   HDL 61.10 06/23/2015   LDLCALC 129*  06/23/2015   ALT 28 06/23/2015   AST 24 06/23/2015   NA 141 06/23/2015   K 5.0 06/23/2015   CL 104 06/23/2015   CREATININE 0.73 06/23/2015   BUN 17 06/23/2015   CO2 30 06/23/2015   TSH 1.29 06/23/2015   HGBA1C 6.9 08/30/2015   MICROALBUR 16.7* 02/21/2014    Mm Diag Breast Tomo Uni Left  05/09/2015  CLINICAL DATA:  Screening recall for a left breast asymmetry seen on the MLO view only. EXAM: DIGITAL DIAGNOSTIC LEFT MAMMOGRAM WITH 3D TOMOSYNTHESIS AND CAD COMPARISON:  Mammograms dated 05/01/2015 and 03/08/2014. ACR Breast Density Category c: The breast tissue is heterogeneously dense, which may obscure small masses. FINDINGS: Cc and MLO tomosynthesis was performed of the left breast. The initially questioned left breast asymmetry resolves on the additional imaging with findings compatible with overlapping tissue. There is no mammographic evidence of malignancy in the left breast. Mammographic images were processed with CAD. IMPRESSION: Initially questioned left breast asymmetry resolves on the additional imaging compatible with overlapping tissue. There is no mammographic evidence of malignancy in the left breast. RECOMMENDATION: Screening mammogram in one year.(Code:[id number]-B-01Y) I have discussed the findings and recommendations with the patient. Results were also provided in writing at the conclusion of the visit. If applicable, a reminder letter will be sent to the patient regarding the next appointment. BI-RADS CATEGORY  1: Negative. Electronically Signed   By: Everlean Alstrom M.D.   On: 05/09/2015 08:25     Assessment & Plan:  Plan I am having Ms. Nowling start on amLODipine. I am also having her maintain her metFORMIN, ranitidine, glipiZIDE, glucose blood, ONETOUCH DELICA LANCETS 99991111, and atorvastatin.  Meds ordered this encounter  Medications  . amLODipine (NORVASC) 5 MG tablet    Sig: Take 1 tablet (5 mg total) by mouth daily.    Dispense:  30 tablet    Refill:  2    Problem List  Items Addressed This Visit    None    Visit Diagnoses    DM (diabetes mellitus) type II uncontrolled, periph vascular disorder ([place])    -  Primary    Relevant Medications    amLODipine (NORVASC) 5 MG tablet    Other Relevant Orders    Ambulatory referral to Podiatry    Essential hypertension        Relevant Medications    amLODipine (NORVASC) 5 MG tablet    Other Relevant Orders    EKG 12-Lead (Completed)       Follow-up: Return in about 2 weeks (around 10/06/2015), or if symptoms worsen or fail to improve, for hypertension.  Garnet Koyanagi, DO

## 2015-09-22 NOTE — Progress Notes (Signed)
Pre visit review using our clinic review tool, if applicable. No additional management support is needed unless otherwise documented below in the visit note. 

## 2015-09-25 ENCOUNTER — Ambulatory Visit (INDEPENDENT_AMBULATORY_CARE_PROVIDER_SITE_OTHER): Payer: Medicare HMO | Admitting: Sports Medicine

## 2015-09-25 ENCOUNTER — Encounter: Payer: Self-pay | Admitting: Sports Medicine

## 2015-09-25 DIAGNOSIS — M79672 Pain in left foot: Secondary | ICD-10-CM | POA: Diagnosis not present

## 2015-09-25 DIAGNOSIS — E119 Type 2 diabetes mellitus without complications: Secondary | ICD-10-CM

## 2015-09-25 DIAGNOSIS — B351 Tinea unguium: Secondary | ICD-10-CM | POA: Diagnosis not present

## 2015-09-25 DIAGNOSIS — M79671 Pain in right foot: Secondary | ICD-10-CM | POA: Diagnosis not present

## 2015-09-25 NOTE — Progress Notes (Signed)
Patient ID: Brandi Mccormick, female   DOB: [birth year], 75 y.o.   MRN: [id number] Subjective: Brandi Mccormick is a 75 y.o. female patient with history of type 2 diabetes who presents to office today complaining of thick, long, painful nails  while ambulating in shoes; unable to trim. Patient states that the glucose reading this morning was 110 mg/dl. Patient denies any new changes in medication or new problems. Patient denies any new cramping, numbness, burning or tingling in the legs.  Patient Active Problem List   Diagnosis Date Noted  . Thrombocytosis ([place]) 07/01/2014  . Protein-calorie malnutrition, severe ([place]) 06/25/2014  . Protein-calorie malnutrition ([place]) 06/24/2014  . Hypokalemia 06/24/2014  . CAP (community acquired pneumonia) 06/24/2014  . H/O hyperthyroidism 12/21/2013  . Diabetes mellitus type 2, noninsulin dependent ([place]) 11/16/2013   Current Outpatient Prescriptions on File Prior to Visit  Medication Sig Dispense Refill  . amLODipine (NORVASC) 5 MG tablet Take 1 tablet (5 mg total) by mouth daily. 30 tablet 2  . atorvastatin (LIPITOR) 40 MG tablet Take 1 tablet (40 mg total) by mouth daily. 90 tablet 1  . glipiZIDE (GLUCOTROL XL) 5 MG 24 hr tablet TAKE ONE TABLET BY MOUTH ONCE DAILY WITH  BREAKFAST 60 tablet 1  . glucose blood (ONETOUCH VERIO) test strip Check Blood sugar once daily. Dx: E11.9 100 each 3  . metFORMIN (GLUCOPHAGE-XR) 750 MG 24 hr tablet Take 1 tablet (750 mg total) by mouth daily with breakfast. 90 tablet 1  . ONETOUCH DELICA LANCETS 99991111 MISC USE  TO CHECK GLUCOSE TWICE DAILY 200 each 5  . ranitidine (ZANTAC) 150 MG tablet Take 1 tablet (150 mg total) by mouth 2 (two) times daily. (Patient taking differently: Take 150 mg by mouth at bedtime. ) 60 tablet 1   No current facility-administered medications on file prior to visit.   Allergies  Allergen Reactions  . Penicillins Nausea And Vomiting   Objective: General: Patient is awake, alert, and oriented x 3  and in no acute distress.  Integument: Skin is warm, dry and supple bilateral. Nails are tender, long, thickened and  dystrophic with subungual debris, consistent with onychomycosis, 1-5 bilateral. No signs of infection. No open lesions or preulcerative lesions present bilateral. Remaining integument unremarkable.  Vasculature:  Dorsalis Pedis pulse 2/4 bilateral. Posterior Tibial pulse  1/4 bilateral.  Capillary fill time <3 sec 1-5 bilateral. Positive hair growth to the level of the digits. Temperature gradient within normal limits. No varicosities present bilateral. No edema present bilateral.   Neurology: The patient has intact sensation measured with a 5.07/10g Semmes Weinstein Monofilament at all pedal sites bilateral . Vibratory sensation intact bilateral with tuning fork. No Babinski sign present bilateral.   Musculoskeletal: Asymptomatic mild bunion deformities noted bilateral. Muscular strength 5/5 in all lower extremity muscular groups bilateral without pain or limitation on range of motion . No tenderness with calf compression bilateral.  Assessment and Plan: Problem List Items Addressed This Visit    None    Visit Diagnoses    Dermatophytosis of nail    -  Primary    Foot pain, bilateral        Diabetes mellitus without complication ([place])          -Examined patient. -Discussed and educated patient on diabetic foot care, especially with  regards to the vascular, neurological and musculoskeletal systems.  -Stressed the importance of good glycemic control and the detriment of not  controlling glucose levels in relation to the foot. -Mechanically debrided  all nails 1-5 bilateral using sterile nail nipper and filed with dremel without incident  -Rx nail lacquer for onychomycosis rx sent to [facility] for mail to patient  -Answered all patient questions -Patient to return in 3 months for at risk foot care -Patient advised to call the office if any problems or questions  arise in the meantime.  [name], DPM

## 2015-09-25 NOTE — Patient Instructions (Signed)
Diabetes and Foot Care Diabetes may cause you to have problems because of poor blood supply (circulation) to your feet and legs. This may cause the skin on your feet to become thinner, break easier, and heal more slowly. Your skin may become dry, and the skin may peel and crack. You may also have nerve damage in your legs and feet causing decreased feeling in them. You may not notice minor injuries to your feet that could lead to infections or more serious problems. Taking care of your feet is one of the most important things you can do for yourself.  HOME CARE INSTRUCTIONS  Wear shoes at all times, even in the house. Do not go barefoot. Bare feet are easily injured.  Check your feet daily for blisters, cuts, and redness. If you cannot see the bottom of your feet, use a mirror or ask someone for help.  Wash your feet with warm water (do not use hot water) and mild soap. Then pat your feet and the areas between your toes until they are completely dry. Do not soak your feet as this can dry your skin.  Apply a moisturizing lotion or petroleum jelly (that does not contain alcohol and is unscented) to the skin on your feet and to dry, brittle toenails. Do not apply lotion between your toes.  Trim your toenails straight across. Do not dig under them or around the cuticle. File the edges of your nails with an emery board or nail file.  Do not cut corns or calluses or try to remove them with medicine.  Wear clean socks or stockings every day. Make sure they are not too tight. Do not wear knee-high stockings since they may decrease blood flow to your legs.  Wear shoes that fit properly and have enough cushioning. To break in new shoes, wear them for just a few hours a day. This prevents you from injuring your feet. Always look in your shoes before you put them on to be sure there are no objects inside.  Do not cross your legs. This may decrease the blood flow to your feet.  If you find a minor scrape,  cut, or break in the skin on your feet, keep it and the skin around it clean and dry. These areas may be cleansed with mild soap and water. Do not cleanse the area with peroxide, alcohol, or iodine.  When you remove an adhesive bandage, be sure not to damage the skin around it.  If you have a wound, look at it several times a day to make sure it is healing.  Do not use heating pads or hot water bottles. They may burn your skin. If you have lost feeling in your feet or legs, you may not know it is happening until it is too late.  Make sure your health care provider performs a complete foot exam at least annually or more often if you have foot problems. Report any cuts, sores, or bruises to your health care provider immediately. SEEK MEDICAL CARE IF:   You have an injury that is not healing.  You have cuts or breaks in the skin.  You have an ingrown nail.  You notice redness on your legs or feet.  You feel burning or tingling in your legs or feet.  You have pain or cramps in your legs and feet.  Your legs or feet are numb.  Your feet always feel cold. SEEK IMMEDIATE MEDICAL CARE IF:   There is increasing redness,   swelling, or pain in or around a wound.  There is a red line that goes up your leg.  Pus is coming from a wound.  You develop a fever or as directed by your health care provider.  You notice a bad smell coming from an ulcer or wound.   This information is not intended to replace advice given to you by your health care provider. Make sure you discuss any questions you have with your health care provider.   Document Released: 08/16/2000 Document Revised: 04/21/2013 Document Reviewed: 01/26/2013 Elsevier Interactive Patient Education 2016 [employer] Inc.  

## 2015-09-26 ENCOUNTER — Telehealth: Payer: Self-pay | Admitting: *Deleted

## 2015-09-26 MED ORDER — NONFORMULARY OR COMPOUNDED ITEM: Status: DC

## 2015-09-26 NOTE — Telephone Encounter (Signed)
Dr. Cannon Kettle ordered [employer] compound Onychomycosis Nail Lacquer.  Faxed to [facility].

## 2015-10-17 ENCOUNTER — Other Ambulatory Visit: Payer: Self-pay | Admitting: Internal Medicine

## 2015-11-02 ENCOUNTER — Telehealth: Payer: Self-pay | Admitting: Internal Medicine

## 2015-11-02 DIAGNOSIS — R69 Illness, unspecified: Secondary | ICD-10-CM | POA: Diagnosis not present

## 2015-11-02 MED ORDER — ONETOUCH DELICA LANCETS 33G MISC: Status: DC

## 2015-11-02 NOTE — Telephone Encounter (Signed)
Pt said she needs a new prescription for her One Touch Delica Lancets called into her pharmacy

## 2015-11-06 ENCOUNTER — Telehealth: Payer: Self-pay | Admitting: Internal Medicine

## 2015-11-06 MED ORDER — GLUCOSE BLOOD VI STRP: ORAL_STRIP | Status: DC

## 2015-11-06 NOTE — Telephone Encounter (Signed)
Test strips for one touch call into [facility]

## 2015-11-08 DIAGNOSIS — R69 Illness, unspecified: Secondary | ICD-10-CM | POA: Diagnosis not present

## 2015-11-20 DIAGNOSIS — Z0100 Encounter for examination of eyes and vision without abnormal findings: Secondary | ICD-10-CM | POA: Diagnosis not present

## 2015-11-20 DIAGNOSIS — H524 Presbyopia: Secondary | ICD-10-CM | POA: Diagnosis not present

## 2015-11-29 DIAGNOSIS — R69 Illness, unspecified: Secondary | ICD-10-CM | POA: Diagnosis not present

## 2015-12-25 ENCOUNTER — Encounter: Payer: Self-pay | Admitting: Sports Medicine

## 2015-12-25 ENCOUNTER — Ambulatory Visit (INDEPENDENT_AMBULATORY_CARE_PROVIDER_SITE_OTHER): Payer: Medicare HMO | Admitting: Sports Medicine

## 2015-12-25 DIAGNOSIS — B351 Tinea unguium: Secondary | ICD-10-CM

## 2015-12-25 DIAGNOSIS — E119 Type 2 diabetes mellitus without complications: Secondary | ICD-10-CM | POA: Diagnosis not present

## 2015-12-25 DIAGNOSIS — M79671 Pain in right foot: Secondary | ICD-10-CM | POA: Diagnosis not present

## 2015-12-25 DIAGNOSIS — M79672 Pain in left foot: Secondary | ICD-10-CM | POA: Diagnosis not present

## 2015-12-25 NOTE — Progress Notes (Signed)
Patient ID: Brandi Mccormick, female   DOB: 1941/03/28, 75 y.o.   MRN: [id number]   Subjective: Brandi Mccormick is a 75 y.o. female patient with history of type 2 diabetes who presents to office today complaining of thick, long, painful nails  while ambulating in shoes; unable to trim. Patient states that the glucose reading this morning was 130 mg/dl. Patient denies any new changes in medication or new problems. Patient denies any new cramping, numbness, burning or tingling in the legs.  Patient Active Problem List   Diagnosis Date Noted  . Thrombocytosis ([place]) 07/01/2014  . Protein-calorie malnutrition, severe ([place]) 06/25/2014  . Protein-calorie malnutrition ([place]) 06/24/2014  . Hypokalemia 06/24/2014  . CAP (community acquired pneumonia) 06/24/2014  . H/O hyperthyroidism 12/21/2013  . Diabetes mellitus type 2, noninsulin dependent ([place]) 11/16/2013   Current Outpatient Prescriptions on File Prior to Visit  Medication Sig Dispense Refill  . amLODipine (NORVASC) 5 MG tablet Take 1 tablet (5 mg total) by mouth daily. 30 tablet 2  . atorvastatin (LIPITOR) 40 MG tablet Take 1 tablet (40 mg total) by mouth daily. 90 tablet 1  . glipiZIDE (GLUCOTROL XL) 5 MG 24 hr tablet TAKE ONE TABLET BY MOUTH ONCE DAILY WITH  BREAKFAST 60 tablet 1  . glucose blood (ONETOUCH VERIO) test strip Check Blood sugar once daily. Dx: E11.9 100 each 3  . metFORMIN (GLUCOPHAGE-XR) 750 MG 24 hr tablet TAKE ONE TABLET BY MOUTH ONCE DAILY WITH BREAKFAST 60 tablet 2  . NONFORMULARY OR COMPOUNDED [place] compound:  Onychomycosis Nail Lacquer - Fluconazole 2%, Terbinafine 1%, DMSO, dispense 120 grams, appt to affected area once daily.  +2refills. 120 each 2  . ONETOUCH DELICA LANCETS 99991111 MISC USE  TO CHECK GLUCOSE TWICE DAILY. DX: E11.9 200 each 3  . ranitidine (ZANTAC) 150 MG tablet Take 1 tablet (150 mg total) by mouth 2 (two) times daily. (Patient taking differently: Take 150 mg by mouth at bedtime. ) 60 tablet  1   No current facility-administered medications on file prior to visit.   Allergies  Allergen Reactions  . Penicillins Nausea And Vomiting   Objective: General: Patient is awake, alert, and oriented x 3 and in no acute distress.  Integument: Skin is warm, dry and supple bilateral. Nails are tender, long, thickened and  dystrophic with subungual debris, consistent with onychomycosis, 1-5 bilateral. No signs of infection. No open lesions or preulcerative lesions present bilateral. Remaining integument unremarkable.  Vasculature:  Dorsalis Pedis pulse 2/4 bilateral. Posterior Tibial pulse  1/4 bilateral.  Capillary fill time <3 sec 1-5 bilateral. Positive hair growth to the level of the digits. Temperature gradient within normal limits. No varicosities present bilateral. No edema present bilateral.   Neurology: The patient has intact sensation measured with a 5.07/10g Semmes Weinstein Monofilament at all pedal sites bilateral . Vibratory sensation intact bilateral with tuning fork. No Babinski sign present bilateral.   Musculoskeletal: Asymptomatic mild bunion deformities noted bilateral. Muscular strength 5/5 in all lower extremity muscular groups bilateral without pain or limitation on range of motion . No tenderness with calf compression bilateral.  Assessment and Plan: Problem List Items Addressed This Visit    None    Visit Diagnoses    Dermatophytosis of nail    -  Primary    Foot pain, bilateral        Diabetes mellitus without complication ([place])          -Examined patient. -Discussed and educated patient on diabetic foot care,  especially with  regards to the vascular, neurological and musculoskeletal systems.  -Stressed the importance of good glycemic control and the detriment of not  controlling glucose levels in relation to the foot. -Mechanically debrided all nails 1-5 bilateral using sterile nail nipper and filed with dremel without incident  -Cont with nail lacquer  for onychomycosis ([place]) -Answered all patient questions -Patient to return in 3 months for at risk foot care -Patient advised to call the office if any problems or questions arise in the meantime.  [name], DPM

## 2015-12-25 NOTE — Patient Instructions (Signed)
Diabetes and Foot Care Diabetes may cause you to have problems because of poor blood supply (circulation) to your feet and legs. This may cause the skin on your feet to become thinner, break easier, and heal more slowly. Your skin may become dry, and the skin may peel and crack. You may also have nerve damage in your legs and feet causing decreased feeling in them. You may not notice minor injuries to your feet that could lead to infections or more serious problems. Taking care of your feet is one of the most important things you can do for yourself.  HOME CARE INSTRUCTIONS  Wear shoes at all times, even in the house. Do not go barefoot. Bare feet are easily injured.  Check your feet daily for blisters, cuts, and redness. If you cannot see the bottom of your feet, use a mirror or ask someone for help.  Wash your feet with warm water (do not use hot water) and mild soap. Then pat your feet and the areas between your toes until they are completely dry. Do not soak your feet as this can dry your skin.  Apply a moisturizing lotion or petroleum jelly (that does not contain alcohol and is unscented) to the skin on your feet and to dry, brittle toenails. Do not apply lotion between your toes.  Trim your toenails straight across. Do not dig under them or around the cuticle. File the edges of your nails with an emery board or nail file.  Do not cut corns or calluses or try to remove them with medicine.  Wear clean socks or stockings every day. Make sure they are not too tight. Do not wear knee-high stockings since they may decrease blood flow to your legs.  Wear shoes that fit properly and have enough cushioning. To break in new shoes, wear them for just a few hours a day. This prevents you from injuring your feet. Always look in your shoes before you put them on to be sure there are no objects inside.  Do not cross your legs. This may decrease the blood flow to your feet.  If you find a minor scrape,  cut, or break in the skin on your feet, keep it and the skin around it clean and dry. These areas may be cleansed with mild soap and water. Do not cleanse the area with peroxide, alcohol, or iodine.  When you remove an adhesive bandage, be sure not to damage the skin around it.  If you have a wound, look at it several times a day to make sure it is healing.  Do not use heating pads or hot water bottles. They may burn your skin. If you have lost feeling in your feet or legs, you may not know it is happening until it is too late.  Make sure your health care provider performs a complete foot exam at least annually or more often if you have foot problems. Report any cuts, sores, or bruises to your health care provider immediately. SEEK MEDICAL CARE IF:   You have an injury that is not healing.  You have cuts or breaks in the skin.  You have an ingrown nail.  You notice redness on your legs or feet.  You feel burning or tingling in your legs or feet.  You have pain or cramps in your legs and feet.  Your legs or feet are numb.  Your feet always feel cold. SEEK IMMEDIATE MEDICAL CARE IF:   There is increasing redness,   swelling, or pain in or around a wound.  There is a red line that goes up your leg.  Pus is coming from a wound.  You develop a fever or as directed by your health care provider.  You notice a bad smell coming from an ulcer or wound.   This information is not intended to replace advice given to you by your health care provider. Make sure you discuss any questions you have with your health care provider.   Document Released: 08/16/2000 Document Revised: 04/21/2013 Document Reviewed: 01/26/2013 Elsevier Interactive Patient Education 2016 [employer] Inc.  

## 2015-12-26 DIAGNOSIS — R69 Illness, unspecified: Secondary | ICD-10-CM | POA: Diagnosis not present

## 2015-12-29 ENCOUNTER — Ambulatory Visit (INDEPENDENT_AMBULATORY_CARE_PROVIDER_SITE_OTHER): Payer: Medicare HMO | Admitting: Internal Medicine

## 2015-12-29 ENCOUNTER — Encounter: Payer: Self-pay | Admitting: Internal Medicine

## 2015-12-29 ENCOUNTER — Other Ambulatory Visit (INDEPENDENT_AMBULATORY_CARE_PROVIDER_SITE_OTHER): Payer: Medicare HMO | Admitting: *Deleted

## 2015-12-29 VITALS — BP 118/78 | HR 78 | Temp 97.8°F | Resp 12 | Wt 133.0 lb

## 2015-12-29 DIAGNOSIS — I10 Essential (primary) hypertension: Secondary | ICD-10-CM

## 2015-12-29 DIAGNOSIS — E119 Type 2 diabetes mellitus without complications: Secondary | ICD-10-CM

## 2015-12-29 DIAGNOSIS — Z8639 Personal history of other endocrine, nutritional and metabolic disease: Secondary | ICD-10-CM | POA: Diagnosis not present

## 2015-12-29 LAB — POCT GLYCOSYLATED HEMOGLOBIN (HGB A1C): HEMOGLOBIN A1C: 7.5

## 2015-12-29 MED ORDER — METFORMIN HCL ER 750 MG PO TB24: ORAL_TABLET | ORAL | Status: DC

## 2015-12-29 MED ORDER — AMLODIPINE BESYLATE 5 MG PO TABS: 5.0000 mg | ORAL_TABLET | Freq: Every day | ORAL | Status: DC

## 2015-12-29 NOTE — Progress Notes (Signed)
Patient ID: Brandi Mccormick, female   DOB: Jul 30, 1941, 75 y.o.   MRN: [id number]  HPI: Brandi Mccormick is a 75 y.o.-year-old female, returning for f/u for DM2, dx 2013, non-insulin-dependent, uncontrolled, without complications and h/o hyperthyroidism. Last visit 4 mo ago.   She has a h/o hyperthyroidism, tx with meds ~20 years ago. No other history available.  She is asymptomatic. Denies palpitations, tremors, heat intolerance, weight loss, hyperdefecation.  Recent labs reviewed - normal at last check: Lab Results  Component Value Date   TSH 1.29 06/23/2015   TSH 1.44 10/18/2014   TSH 0.93 08/15/2014   TSH 0.44 02/17/2014   TSH 0.28* 12/20/2013   FREET4 0.91 08/15/2014   FREET4 1.00 02/17/2014   FREET4 0.97 12/20/2013   DM2: Hemoglobin A1c great in last year: Lab Results  Component Value Date   HGBA1C 6.9 08/30/2015   HGBA1C 6.7 05/02/2015   HGBA1C 6.6* 12/27/2014   Pt is on a regimen of: - Metformin ER 750 mg in am - Glipizide XL 5 mg in am - Glipizide XL 1/2 of a 5 mg tablet before dinner.  We tried Tradjenta 5 mg daily in am - too expensive. She was getting diarrhea from Metformin >> now Metformin XR 750 mg in am >> tolerates it well.  Pt now checks her sugars - 2x a day and they are better: - am: 163-247 >> 97-115 >> 113-176 >> 94-176, 210x1 >> 91-163, 175 >> 107-147 >> 112-160 >> 103-146, 155 - 2h after b'fast: 151, 157, 170 >> n/c >> 93-175, 214 >> 175, 181 >> 161 >> 127-136 >> 107-150 >> 132, 138 - before lunch: 187 >> n/c >> 76-92 >> 79 >> 96-149 >> 142-176 >>102-163, 176 (snack) >> 134 >> 109-156 - 2h after lunch:191-270 >> 117-120 >> n/c >> 126, 172 >> 154, 278 (pound cake) >> 78-163 >> 96-127 >> 130-159 - before dinner:n/c >> 108-174 >> 108-177 >> 78, 106-141 >> 85-136, 154 >> 109-148, 174 >> 94-150 - 2h after dinner: 99-196, 271x1 >> 148, 168 >> 125, 153, 201, 226, 288 >> 149, 170 >> 114-150, 170 >> 104-146, 155 - bedtime: 285, 313 >> 117-120 >> 70, 111-190 >>  117-173, 205 x1 >> n/c >> 133, 160 >> 140-153 - nighttime: n/c >> 161 >> n/c No lows; had 1 CBG at 79 >> 78 >> 96 >> 94  she has hypoglycemia awareness. Highest sugar was  289 x1 >> 313 x1 >> <150 >> 271x1 >> 201x1 >> 288 >> 170s >> 170 >> 163  Meter: OneTouch Verio.  Pt's meals are: - Breakfast: egg, coffee, juice; sometime - Lunch: No Lunch - Dinner: meat + vegetables - Snacks: 1-2 Cut doen on Pepsi and on sweets.  - no CKD, last BUN/creatinine:  Lab Results  Component Value Date   BUN 17 06/23/2015   CREATININE 0.73 06/23/2015  ACR 8 on 11/16/2013. - last set of lipids: Lab Results  Component Value Date   CHOL 209* 06/23/2015   HDL 61.10 06/23/2015   LDLCALC 129* 06/23/2015   TRIG 91.0 06/23/2015   CHOLHDL 3 06/23/2015  On Simvastatin >> off x 1 week b/c fatigue. - last eye exam was in 11/2015. No DR.  - no numbness and tingling in her feet.  ROS: Constitutional: no weight gain/loss, + fatigue, no subjective hyperthermia/hypothermia Eyes: no blurry vision, no xerophthalmia ENT: no sore throat, no nodules palpated in throat, no dysphagia/odynophagia, no hoarseness Cardiovascular: no CP/SOB/palpitations/leg swelling Respiratory: no cough/SOB Gastrointestinal: no N/V/D/C Musculoskeletal:  no muscle/joint aches Skin: no rashes Neurological: no tremors/numbness/tingling/dizziness  I reviewed pt's medications, allergies, PMH, social hx, family hx, and changes were documented in the history of present illness. Otherwise, unchanged from my initial visit note.  PE: BP 118/78 mmHg  Pulse 78  Temp(Src) 97.8 F (36.6 C) (Oral)  Resp 12  Wt 133 lb (60.328 kg)  SpO2 98% Body mass index is 22.13 kg/(m^2). Wt Readings from Last 3 Encounters:  12/29/15 133 lb (60.328 kg)  09/22/15 130 lb 3.2 oz (59.058 kg)  08/30/15 130 lb (58.968 kg)   Constitutional: thin, in NAD Eyes: PERRLA, EOMI, no exophthalmos ENT: moist mucous membranes, no thyromegaly, no cervical  lymphadenopathy Cardiovascular: tachycardia, RR, No MRG Respiratory: CTA B Gastrointestinal: abdomen soft, NT, ND, BS+ Musculoskeletal: no deformities, strength intact in all 4 Skin: moist, warm, no rashes Neurological: no tremor with outstretched hands, DTR normal in all 4  ASSESSMENT: 1. DM2, non-insulin-dependent, uncontrolled, without complications  2. H/o Hyperthyroidism - 20 years ago - was on meds, cannot remember name  PLAN:  1. Patient with controlled diabetes, on oral antidiabetic regimen, unchanged since last visit. Sugars are same as at last visit - slightly higher than goal before meals >> will increase Metformin ER to 2x a day. - I suggested to:  Patient Instructions  Please continue: - Glipizide XL 5 mg in am - Glipizide XL 1/2 of a 5 mg tablet before dinner.   Please increase: - Metformin ER 750 mg to 2x a day  Please return in 3 months with your sugar log.   - continue checking sugars at different times of the day - check 1-2 times a day, rotating checks - UTD with eye exams - refuses flu shots (son got sick after it) - will check HbA1c today >> 7.5% (higher) - Return to clinic in 3 mo with sugar log   2. H/o hyperthyroidism - appears euthyroid - last TFTs normal >> will repeat in 1 year from previous

## 2015-12-29 NOTE — Patient Instructions (Signed)
Patient Instructions  Please continue: - Glipizide XL 5 mg in am - Glipizide XL 1/2 of a 5 mg tablet before dinner.   Please increase: - Metformin ER 750 mg to 2x a day  Please return in 3 months with your sugar log.

## 2016-02-18 DIAGNOSIS — R69 Illness, unspecified: Secondary | ICD-10-CM | POA: Diagnosis not present

## 2016-02-27 ENCOUNTER — Telehealth: Payer: Self-pay

## 2016-02-27 NOTE — Telephone Encounter (Signed)
Patient is on the list for [facility] 2017 and may be a good candidate for an AWV in 2017. Please let me know if/when appt is scheduled.   Note: due around August.  

## 2016-03-01 NOTE — Telephone Encounter (Signed)
Patient scheduled for 04/24/2016 at 9am,

## 2016-03-01 NOTE — Telephone Encounter (Signed)
Thank you :)

## 2016-03-25 ENCOUNTER — Encounter: Payer: Self-pay | Admitting: Sports Medicine

## 2016-03-25 ENCOUNTER — Ambulatory Visit (INDEPENDENT_AMBULATORY_CARE_PROVIDER_SITE_OTHER): Payer: Medicare HMO | Admitting: Sports Medicine

## 2016-03-25 DIAGNOSIS — B351 Tinea unguium: Secondary | ICD-10-CM

## 2016-03-25 DIAGNOSIS — M79672 Pain in left foot: Secondary | ICD-10-CM

## 2016-03-25 DIAGNOSIS — M79671 Pain in right foot: Secondary | ICD-10-CM

## 2016-03-25 DIAGNOSIS — E119 Type 2 diabetes mellitus without complications: Secondary | ICD-10-CM

## 2016-03-25 NOTE — Progress Notes (Signed)
Patient ID: Brandi Mccormick, female   DOB: [birth year], 75 y.o.   MRN: [id number]   Subjective: Brandi Mccormick is a 75 y.o. female patient with history of type 2 diabetes who presents to office today complaining of thick, long, painful nails  while ambulating in shoes; unable to trim. Patient states that the glucose reading this morning was 130 mg/dl. Range 130-40; Patient denies any new changes in medication or new problems. Patient denies any new cramping, numbness, burning or tingling in the legs.  Patient Active Problem List   Diagnosis Date Noted  . Thrombocytosis ([place]) 07/01/2014  . Protein-calorie malnutrition, severe ([place]) 06/25/2014  . Protein-calorie malnutrition ([place]) 06/24/2014  . Hypokalemia 06/24/2014  . CAP (community acquired pneumonia) 06/24/2014  . H/O hyperthyroidism 12/21/2013  . Diabetes mellitus type 2, noninsulin dependent ([place]) 11/16/2013   Current Outpatient Prescriptions on File Prior to Visit  Medication Sig Dispense Refill  . amLODipine (NORVASC) 5 MG tablet Take 1 tablet (5 mg total) by mouth daily. 90 tablet 3  . atorvastatin (LIPITOR) 40 MG tablet Take 1 tablet (40 mg total) by mouth daily. 90 tablet 1  . glipiZIDE (GLUCOTROL XL) 5 MG 24 hr tablet TAKE ONE TABLET BY MOUTH ONCE DAILY WITH  BREAKFAST 60 tablet 1  . glucose blood (ONETOUCH VERIO) test strip Check Blood sugar once daily. Dx: E11.9 100 each 3  . metFORMIN (GLUCOPHAGE-XR) 750 MG 24 hr tablet TAKE ONE TABLET BY MOUTH 2x DAILY: WITH BREAKFAST AND DINNER 180 tablet 3  . NONFORMULARY OR COMPOUNDED [place] compound:  Onychomycosis Nail Lacquer - Fluconazole 2%, Terbinafine 1%, DMSO, dispense 120 grams, appt to affected area once daily.  +2refills. 120 each 2  . ONETOUCH DELICA LANCETS 99991111 MISC USE  TO CHECK GLUCOSE TWICE DAILY. DX: E11.9 200 each 3  . ranitidine (ZANTAC) 150 MG tablet Take 1 tablet (150 mg total) by mouth 2 (two) times daily. (Patient taking differently: Take 150 mg by mouth  at bedtime. ) 60 tablet 1   No current facility-administered medications on file prior to visit.    Allergies  Allergen Reactions  . Penicillins Nausea And Vomiting   Objective: General: Patient is awake, alert, and oriented x 3 and in no acute distress.  Integument: Skin is warm, dry and supple bilateral. Nails are tender, long, thickened and  dystrophic with subungual debris, consistent with onychomycosis, 1-5 bilateral. No signs of infection. No open lesions or preulcerative lesions present bilateral. Remaining integument unremarkable.  Vasculature:  Dorsalis Pedis pulse 2/4 bilateral. Posterior Tibial pulse  1/4 bilateral.  Capillary fill time <3 sec 1-5 bilateral. Positive hair growth to the level of the digits. Temperature gradient within normal limits. No varicosities present bilateral. No edema present bilateral.   Neurology: The patient has intact sensation measured with a 5.07/10g Semmes Weinstein Monofilament at all pedal sites bilateral . Vibratory sensation intact bilateral with tuning fork. No Babinski sign present bilateral.   Musculoskeletal: Asymptomatic mild bunion deformities noted bilateral. Muscular strength 5/5 in all lower extremity muscular groups bilateral without pain or limitation on range of motion . No tenderness with calf compression bilateral.  Assessment and Plan: Problem List Items Addressed This Visit    None    Visit Diagnoses    Dermatophytosis of nail    -  Primary   Foot pain, bilateral       Diabetes mellitus without complication ([place])         -Examined patient. -Discussed and educated patient on diabetic  foot care, especially with  regards to the vascular, neurological and musculoskeletal systems.  -Stressed the importance of good glycemic control and the detriment of not  controlling glucose levels in relation to the foot. -Mechanically debrided all nails 1-5 bilateral using sterile nail nipper and filed with dremel without incident   -Cont with nail lacquer for onychomycosis ([place]) and recommended vinegar soaks -Answered all patient questions -Patient to return in 3 months for at risk foot care -Patient advised to call the office if any problems or questions arise in the meantime.  [name], DPM

## 2016-03-28 ENCOUNTER — Encounter: Payer: Self-pay | Admitting: Internal Medicine

## 2016-03-28 ENCOUNTER — Ambulatory Visit (INDEPENDENT_AMBULATORY_CARE_PROVIDER_SITE_OTHER): Payer: Medicare HMO | Admitting: Internal Medicine

## 2016-03-28 VITALS — BP 173/91 | HR 90 | Wt 128.0 lb

## 2016-03-28 DIAGNOSIS — E119 Type 2 diabetes mellitus without complications: Secondary | ICD-10-CM | POA: Diagnosis not present

## 2016-03-28 DIAGNOSIS — Z8639 Personal history of other endocrine, nutritional and metabolic disease: Secondary | ICD-10-CM | POA: Diagnosis not present

## 2016-03-28 LAB — POCT GLYCOSYLATED HEMOGLOBIN (HGB A1C): HEMOGLOBIN A1C: 7.1

## 2016-03-28 NOTE — Progress Notes (Addendum)
Patient ID: Brandi Mccormick, female   DOB: 1940-11-29, 75 y.o.   MRN: [id number]  HPI: Brandi Mccormick is a 75 y.o.-year-old female, returning for f/u for DM2, dx 2013, non-insulin-dependent, uncontrolled, without complications and h/o hyperthyroidism. Last visit 3 mo ago.   She has a h/o hyperthyroidism, tx with meds ~20 years ago. No other history available.  She is asymptomatic. Denies palpitations, tremors, heat intolerance, hyperdefecation.  Recent labs reviewed - normal at last check: Lab Results  Component Value Date   TSH 1.29 06/23/2015   TSH 1.44 10/18/2014   TSH 0.93 08/15/2014   TSH 0.44 02/17/2014   TSH 0.28 (L) 12/20/2013   FREET4 0.91 08/15/2014   FREET4 1.00 02/17/2014   FREET4 0.97 12/20/2013   DM2: Hemoglobin A1c in last year: Lab Results  Component Value Date   HGBA1C 7.5 12/29/2015   HGBA1C 6.9 08/30/2015   HGBA1C 6.7 05/02/2015   Pt is on a regimen of: - Metformin ER 750 mg 2x a day - increased 12/2015 - Glipizide XL 5 mg in am - Glipizide XL 1/2 of a 5 mg tablet before dinner.  We tried Tradjenta 5 mg daily in am - too expensive. She was getting diarrhea from Metformin >> now Metformin XR 750 mg in am >> tolerates it well.  Pt now checks her sugars - 2x a day and they are: - am: 97-115 >> 113-176 >> 94-176, 210x1 >> 91-163, 175 >> 107-147 >> 112-160 >> 103-146, 155 >> 130-140 - 2h after b'fast: 151, 157, 170 >> n/c >> 93-175, 214 >> 175, 181 >> 161 >> 127-136 >> 107-150 >> 132, 138 >> n/c - before lunch: 187 >> n/c >> 76-92 >> 79 >> 96-149 >> 142-176 >>102-163, 176 (snack) >> 134 >> 109-156 >> 140s - 2h after lunch: 117-120 >> n/c >> 126, 172 >> 154, 278 (pound cake) >> 78-163 >> 96-127 >> 130-159 >> n/c - before dinner:n/c >> 108-174 >> 108-177 >> 78, 106-141 >> 85-136, 154 >> 109-148, 174 >> 94-150 >> 130-140 - 2h after dinner: 148, 168 >> 125, 153, 201, 226, 288 >> 149, 170 >> 114-150, 170 >> 104-146, 155 >> n/c - bedtime: 117-120 >> 70, 111-190 >>  117-173, 205 x1 >> n/c >> 133, 160 >> 140-153 >> 140s - nighttime: n/c >> 161 >> n/c No lows; had 1 CBG at 79 >> 78 >> 96 >> 94 >> 128 she has hypoglycemia awareness. Highest sugar was  289 x1 >> 313 x1 >> <150 >> 271x1 >> 201x1 >> 288 >> 170s >> 170 >> 163 >> 148  Meter: OneTouch Verio.  Pt's meals are: - Breakfast: egg, coffee, juice; sometime - Lunch: No Lunch - Dinner: meat + vegetables - Snacks: 1-2 Cut doen on Pepsi and on sweets.  - no CKD, last BUN/creatinine:  Lab Results  Component Value Date   BUN 17 06/23/2015   CREATININE 0.73 06/23/2015  ACR 8 on 11/16/2013. - last set of lipids: Lab Results  Component Value Date   CHOL 209 (H) 06/23/2015   HDL 61.10 06/23/2015   LDLCALC 129 (H) 06/23/2015   TRIG 91.0 06/23/2015   CHOLHDL 3 06/23/2015  On Simvastatin >> off now b/c fatigue. - last eye exam was in 11/2015. No DR.  - no numbness and tingling in her feet.  ROS: Constitutional: no weight gain/loss, no fatigue, no subjective hyperthermia/hypothermia Eyes: no blurry vision, no xerophthalmia ENT: no sore throat, no nodules palpated in throat, no dysphagia/odynophagia, no hoarseness Cardiovascular: no  CP/SOB/palpitations/leg swelling Respiratory: no cough/SOB Gastrointestinal: no N/V/D/C Musculoskeletal: no muscle/joint aches Skin: no rashes Neurological: no tremors/numbness/tingling/dizziness  I reviewed pt's medications, allergies, PMH, social hx, family hx, and changes were documented in the history of present illness. Otherwise, unchanged from my initial visit note.  PE: BP (!) 173/91 (BP Location: Left Arm, Patient Position: Sitting)   Pulse 90   Wt 128 lb (58.1 kg)   SpO2 98%   BMI 21.30 kg/m  Body mass index is 21.3 kg/m. Wt Readings from Last 3 Encounters:  03/28/16 128 lb (58.1 kg)  12/29/15 133 lb (60.3 kg)  09/22/15 130 lb 3.2 oz (59.1 kg)   Constitutional: thin, in NAD Eyes: PERRLA, EOMI, no exophthalmos ENT: moist mucous membranes, no  thyromegaly, no cervical lymphadenopathy Cardiovascular: tachycardia, RR, No MRG Respiratory: CTA B Gastrointestinal: abdomen soft, NT, ND, BS+ Musculoskeletal: no deformities, strength intact in all 4 Skin: moist, warm, no rashes Neurological: no tremor with outstretched hands, DTR normal in all 4  ASSESSMENT: 1. DM2, non-insulin-dependent, uncontrolled, without complications  2. H/o Hyperthyroidism - 20 years ago - was on meds, cannot remember name  PLAN:  1. Patient with controlled diabetes, on oral antidiabetic regimen, unchanged since last visit. Sugars were higher at last visit >> we increased Metformin ER to 2x a day >> she tolerates this well. Sugars stable 130-140. - I suggested to:  Patient Instructions  Please continue: - Glipizide XL 5 mg in am - Glipizide XL 1/2 of a 5 mg tablet before dinner.  - Metformin ER 750 mg 2x a day  Please return in 3 months with your sugar log.   - continue checking sugars at different times of the day - check 1-2 times a day, rotating checks - UTD with eye exams - refuses flu shots (son got sick after it) - will check HbA1c today >> 7.1% (improved!) - Return to clinic in 3 mo with sugar log   2. H/o hyperthyroidism - appears euthyroid - last TFTs normal >> will repeat in 1 year from previous

## 2016-03-28 NOTE — Patient Instructions (Signed)
Please continue: - Glipizide XL 5 mg in am - Glipizide XL 1/2 of a 5 mg tablet before dinner.  - Metformin ER 750 mg  2x a day  Please return in 3 months with your sugar log.

## 2016-04-11 ENCOUNTER — Other Ambulatory Visit: Payer: Self-pay | Admitting: Internal Medicine

## 2016-04-24 ENCOUNTER — Ambulatory Visit: Payer: Medicare HMO

## 2016-04-27 DIAGNOSIS — R69 Illness, unspecified: Secondary | ICD-10-CM | POA: Diagnosis not present

## 2016-04-29 DIAGNOSIS — R69 Illness, unspecified: Secondary | ICD-10-CM | POA: Diagnosis not present

## 2016-06-01 NOTE — Telephone Encounter (Signed)
Pt cancled AWV. If pt is rescheduled please let me know.

## 2016-06-03 NOTE — Telephone Encounter (Signed)
Unable to reach patient no voicemail set up.

## 2016-06-04 DIAGNOSIS — R69 Illness, unspecified: Secondary | ICD-10-CM | POA: Diagnosis not present

## 2016-06-17 ENCOUNTER — Other Ambulatory Visit: Payer: Self-pay | Admitting: Internal Medicine

## 2016-06-25 NOTE — Telephone Encounter (Signed)
lvm informing patient to call and schedule appointment.

## 2016-06-25 NOTE — Telephone Encounter (Signed)
Can you try to reach pt again?

## 2016-06-28 ENCOUNTER — Encounter: Payer: Self-pay | Admitting: Internal Medicine

## 2016-06-28 ENCOUNTER — Other Ambulatory Visit: Payer: Self-pay

## 2016-06-28 ENCOUNTER — Ambulatory Visit (INDEPENDENT_AMBULATORY_CARE_PROVIDER_SITE_OTHER): Payer: Medicare HMO | Admitting: Internal Medicine

## 2016-06-28 VITALS — BP 118/84 | HR 82 | Ht 64.0 in | Wt 127.0 lb

## 2016-06-28 DIAGNOSIS — E119 Type 2 diabetes mellitus without complications: Secondary | ICD-10-CM | POA: Diagnosis not present

## 2016-06-28 DIAGNOSIS — Z8639 Personal history of other endocrine, nutritional and metabolic disease: Secondary | ICD-10-CM

## 2016-06-28 DIAGNOSIS — E785 Hyperlipidemia, unspecified: Secondary | ICD-10-CM | POA: Diagnosis not present

## 2016-06-28 LAB — POCT GLYCOSYLATED HEMOGLOBIN (HGB A1C): Hemoglobin A1C: 6.6

## 2016-06-28 LAB — COMPLETE METABOLIC PANEL WITH GFR
ALT: 16 U/L (ref 6–29)
AST: 19 U/L (ref 10–35)
Albumin: 3.8 g/dL (ref 3.6–5.1)
Alkaline Phosphatase: 66 U/L (ref 33–130)
BUN: 16 mg/dL (ref 7–25)
CHLORIDE: 103 mmol/L (ref 98–110)
CO2: 26 mmol/L (ref 20–31)
CREATININE: 0.82 mg/dL (ref 0.60–0.93)
Calcium: 9.7 mg/dL (ref 8.6–10.4)
GFR, Est African American: 82 mL/min (ref >=60)
GFR, Est Non African American: 71 mL/min (ref >=60)
Glucose, Bld: 129 mg/dL — ABNORMAL HIGH (ref 65–99)
Potassium: 4.7 mmol/L (ref 3.5–5.3)
Sodium: 139 mmol/L (ref 135–146)
TOTAL PROTEIN: 6.8 g/dL (ref 6.1–8.1)
Total Bilirubin: 0.6 mg/dL (ref 0.2–1.2)

## 2016-06-28 LAB — LIPID PANEL
CHOL/HDL RATIO: 4
CHOLESTEROL: 251 mg/dL — AB (ref 0–200)
HDL: 63.6 mg/dL (ref >39.00)
LDL CALC: 169 mg/dL — AB (ref 0–99)
NonHDL: 187.75
TRIGLYCERIDES: 96 mg/dL (ref 0.0–149.0)
VLDL: 19.2 mg/dL (ref 0.0–40.0)

## 2016-06-28 LAB — MICROALBUMIN / CREATININE URINE RATIO
CREATININE, U: 195.8 mg/dL
Microalb Creat Ratio: 48.8 mg/g — ABNORMAL HIGH (ref 0.0–30.0)
Microalb, Ur: 95.6 mg/dL — ABNORMAL HIGH (ref 0.0–1.9)

## 2016-06-28 MED ORDER — ATORVASTATIN CALCIUM 40 MG PO TABS: 40.0000 mg | ORAL_TABLET | Freq: Every day | ORAL | 1 refills | Status: DC

## 2016-06-28 NOTE — Progress Notes (Signed)
Patient ID: Brandi Mccormick, female   DOB: [birth year], 75 y.o.   MRN: [id number]  HPI: Brandi Mccormick is a 75 y.o.-year-old female, returning for f/u for DM2, dx 2013, non-insulin-dependent, uncontrolled, without complications and h/o hyperthyroidism. Last visit 3 mo ago.   She has a h/o hyperthyroidism, tx with meds ~20 years ago. No other history available.  She is asymptomatic. Denies palpitations, tremors, heat intolerance, hyperdefecation.  Recent labs reviewed - normal in last 2.5 years:  Lab Results  Component Value Date   TSH 1.29 06/23/2015   TSH 1.44 10/18/2014   TSH 0.93 08/15/2014   TSH 0.44 02/17/2014   TSH 0.28 (L) 12/20/2013   FREET4 0.91 08/15/2014   FREET4 1.00 02/17/2014   FREET4 0.97 12/20/2013   DM2: Hemoglobin A1c in last year: Lab Results  Component Value Date   HGBA1C 7.1 03/28/2016   HGBA1C 7.5 12/29/2015   HGBA1C 6.9 08/30/2015   Pt is on a regimen of: - Metformin ER 750 mg 2x a day - increased 12/2015 - Glipizide XL 5 mg in am - Glipizide XL 1/2 of a 5 mg tablet before a larger dinner.  We tried Tradjenta 5 mg daily in am - too expensive. She was getting diarrhea from Metformin >> now Metformin XR 750 mg in am >> tolerates it well.  Pt now checks her sugars - 2x a day and they are: - am: 91-163, 175 >> 107-147 >> 112-160 >> 103-146, 155 >> 130-140 >> 101-150, 158 - 2h after b'fast: 175, 181 >> 161 >> 127-136 >> 107-150 >> 132, 138 >> n/c >> 132-150 - before lunch: 142-176 >>102-163, 176 (snack) >> 134 >> 109-156 >> 140s >> 126-146, 154 - 2h after lunch: 154, 278 (pound cake) >> 78-163 >> 96-127 >> 130-159 >> n/c >> 109, 142-149, 156 - before dinner:85-136, 154 >> 109-148, 174 >> 94-150 >> 130-140 >> 106-154 - 2h after dinner: 149, 170 >> 114-150, 170 >> 104-146, 155 >> n/c >> 132-156 - bedtime: 117-173, 205 x1 >> n/c >> 133, 160 >> 140-153 >> 140s >> 113, 151 - nighttime: n/c >> 161 >> n/c Lowest sugar: 128 >> 101; she has hypoglycemia  awareness. Highest sugar:  148 >> 158  Meter: OneTouch Verio.  Pt's meals are: - Breakfast: egg, coffee, juice; sometime - Lunch: No Lunch - Dinner: meat + vegetables - Snacks: 1-2 Cut doen on Pepsi and on sweets.  - no CKD, last BUN/creatinine:  Lab Results  Component Value Date   BUN 17 06/23/2015   CREATININE 0.73 06/23/2015  ACR 8 on 11/16/2013. - last set of lipids: Lab Results  Component Value Date   CHOL 209 (H) 06/23/2015   HDL 61.10 06/23/2015   LDLCALC 129 (H) 06/23/2015   TRIG 91.0 06/23/2015   CHOLHDL 3 06/23/2015  On Simvastatin >> off now b/c fatigue. - last eye exam was in 11/2015. No DR.  - no numbness and tingling in her feet.  ROS: Constitutional: no weight gain/loss, no fatigue, no subjective hyperthermia/hypothermia Eyes: no blurry vision, no xerophthalmia ENT: no sore throat, no nodules palpated in throat, no dysphagia/odynophagia, no hoarseness Cardiovascular: no CP/SOB/palpitations/leg swelling Respiratory: no cough/SOB Gastrointestinal: no N/V/D/C Musculoskeletal: no muscle/joint aches Skin: no rashes Neurological: no tremors/numbness/tingling/dizziness  I reviewed pt's medications, allergies, PMH, social hx, family hx, and changes were documented in the history of present illness. Otherwise, unchanged from my initial visit note.  PE: BP 118/84 (BP Location: Left Arm, Patient Position: Sitting)   Pulse 82  Ht 5\' 4"  (1.626 m)   Wt 127 lb (57.6 kg)   SpO2 97%   BMI 21.80 kg/m  Body mass index is 21.8 kg/m. Wt Readings from Last 3 Encounters:  06/28/16 127 lb (57.6 kg)  03/28/16 128 lb (58.1 kg)  12/29/15 133 lb (60.3 kg)   Constitutional: thin, in NAD Eyes: PERRLA, EOMI, no exophthalmos ENT: moist mucous membranes, no thyromegaly, no cervical lymphadenopathy Cardiovascular: tachycardia, RR, No MRG Respiratory: CTA B Gastrointestinal: abdomen soft, NT, ND, BS+ Musculoskeletal: no deformities, strength intact in all 4 Skin: moist,  warm, no rashes Neurological: no tremor with outstretched hands, DTR normal in all 4  ASSESSMENT: 1. DM2, non-insulin-dependent, uncontrolled, without complications  2. H/o Hyperthyroidism - 20 years ago - was on meds, cannot remember name  PLAN:  1. Patient with controlled diabetes, on oral antidiabetic regimen, unchanged since last visit. Her sugars are stable throughout the day >> will continue current regimen. I did advise her that she may need to take a crushed Glipizide XL 5 mg before a Holiday dinner. - I suggested to:  Patient Instructions  Please continue: - Glipizide XL 5 mg in am - Glipizide XL 1/2 of a 5 mg tablet before dinner (crush a whole tablet before a very large dinner) - Metformin ER 750 mg 2x a day  Please return in 3 months with your sugar log.   Please stop at the lab.  - continue checking sugars at different times of the day - check 1-2 times a day, rotating checks - UTD with eye exams - again refuses flu shots (son got sick after it) - will check Lipids, CMP, ACR - will check HbA1c today >> 6.6% (improved!) - Return to clinic in 3 mo with sugar log   2. H/o hyperthyroidism - appears euthyroid - last TFTs normal >> will repeat now, in 1 year from previous  Office Visit on 06/28/2016  Component Date Value Ref Range Status  . Free T4 07/01/2016 0.96  0.60 - 1.60 ng/dL Final   Comment: Specimens from patients who are undergoing biotin therapy and /or ingesting biotin supplements may contain high levels of biotin.  The higher biotin concentration in these specimens interferes with this Free T4 assay.  Specimens that contain high levels  of biotin may cause false high results for this Free T4 assay.  Please interpret results in light of the total clinical presentation of the patient.    . T3, Free 07/01/2016 3.1  2.3 - 4.2 pg/mL Final  . TSH 07/01/2016 1.12  0.35 - 4.50 uIU/mL Final  . Sodium 06/28/2016 139  135 - 146 mmol/L Final  . Potassium 06/28/2016  4.7  3.5 - 5.3 mmol/L Final  . Chloride 06/28/2016 103  98 - 110 mmol/L Final  . CO2 06/28/2016 26  20 - 31 mmol/L Final  . Glucose, Bld 06/28/2016 129* 65 - 99 mg/dL Final  . BUN 06/28/2016 16  7 - 25 mg/dL Final  . Creat 06/28/2016 0.82  0.60 - 0.93 mg/dL Final   Comment:   For patients > or = 74 years of age: The upper reference limit for Creatinine is approximately 13% higher for people identified as African-American.     . Total Bilirubin 06/28/2016 0.6  0.2 - 1.2 mg/dL Final  . Alkaline Phosphatase 06/28/2016 66  33 - 130 U/L Final  . AST 06/28/2016 19  10 - 35 U/L Final  . ALT 06/28/2016 16  6 - 29 U/L Final  . Total  Protein 06/28/2016 6.8  6.1 - 8.1 g/dL Final  . Albumin 06/28/2016 3.8  3.6 - 5.1 g/dL Final  . Calcium 06/28/2016 9.7  8.6 - 10.4 mg/dL Final  . GFR, Est African American 06/28/2016 82  >=60 mL/min Final  . GFR, Est Non African American 06/28/2016 71  >=60 mL/min Final  . Cholesterol 06/28/2016 251* 0 - 200 mg/dL Final  . Triglycerides 06/28/2016 96.0  0.0 - 149.0 mg/dL Final  . HDL 06/28/2016 63.60  >39.00 mg/dL Final  . VLDL 06/28/2016 19.2  0.0 - 40.0 mg/dL Final  . LDL Cholesterol 06/28/2016 169* 0 - 99 mg/dL Final  . Total CHOL/HDL Ratio 06/28/2016 4   Final  . NonHDL 06/28/2016 187.75   Final  . Microalb, Ur 06/28/2016 95.6* 0.0 - 1.9 mg/dL Final  . Creatinine,U 06/28/2016 195.8  mg/dL Final  . Microalb Creat Ratio 06/28/2016 48.8* 0.0 - 30.0 mg/g Final  . Hemoglobin A1C 06/28/2016 6.6   Final   Labs are good except elevated ACR. I will need to repeat this at next visit, and if still elevated, she needs to start ACE inhibitor. Also, LDL cholesterol is very high >> will try to see whether she accepts to try Livalo, which is more diabetes-friendly and hopefully will cause her less fatigue. I'm not sure if this is covered by her insurance though...  Philemon Kingdom, MD PhD [facility] Endocrinology

## 2016-06-28 NOTE — Patient Instructions (Addendum)
Please continue: - Glipizide XL 5 mg in am - Glipizide XL 1/2 of a 5 mg tablet before dinner (crush a whole tablet before a very large dinner) - Metformin ER 750 mg 2x a day  Please return in 3 months with your sugar log.   Please stop at the lab.

## 2016-07-01 LAB — TSH: TSH: 1.12 u[IU]/mL (ref 0.35–4.50)

## 2016-07-01 LAB — T4, FREE: FREE T4: 0.96 ng/dL (ref 0.60–1.60)

## 2016-07-01 LAB — T3, FREE: T3, Free: 3.1 pg/mL (ref 2.3–4.2)

## 2016-07-02 ENCOUNTER — Ambulatory Visit: Payer: Medicare HMO | Admitting: Sports Medicine

## 2016-07-05 DIAGNOSIS — R69 Illness, unspecified: Secondary | ICD-10-CM | POA: Diagnosis not present

## 2016-07-08 ENCOUNTER — Other Ambulatory Visit: Payer: Self-pay | Admitting: Family Medicine

## 2016-07-08 DIAGNOSIS — E785 Hyperlipidemia, unspecified: Secondary | ICD-10-CM

## 2016-07-09 ENCOUNTER — Encounter: Payer: Self-pay | Admitting: Sports Medicine

## 2016-07-09 ENCOUNTER — Ambulatory Visit (INDEPENDENT_AMBULATORY_CARE_PROVIDER_SITE_OTHER): Payer: Medicare HMO | Admitting: Sports Medicine

## 2016-07-09 DIAGNOSIS — M79676 Pain in unspecified toe(s): Secondary | ICD-10-CM

## 2016-07-09 DIAGNOSIS — B351 Tinea unguium: Secondary | ICD-10-CM | POA: Diagnosis not present

## 2016-07-09 DIAGNOSIS — M79671 Pain in right foot: Secondary | ICD-10-CM

## 2016-07-09 DIAGNOSIS — E119 Type 2 diabetes mellitus without complications: Secondary | ICD-10-CM | POA: Diagnosis not present

## 2016-07-09 DIAGNOSIS — M79672 Pain in left foot: Secondary | ICD-10-CM

## 2016-07-09 NOTE — Patient Instructions (Signed)
Vinegar soaks once a week. 1 cup of vinegar to 8 cups of warm water.

## 2016-07-09 NOTE — Progress Notes (Signed)
Patient ID: Brandi Mccormick, female   DOB: [birth year], 75 y.o.   MRN: [id number]   Subjective: Brandi Mccormick is a 75 y.o. female patient with history of type 2 diabetes who presents to office today complaining of thick, long, painful nails  while ambulating in shoes; unable to trim. Patient states that the glucose reading this morning was "good". Patient denies any new changes in medication or new problems. Patient denies any new cramping, numbness, burning or tingling in the legs.  Patient Active Problem List   Diagnosis Date Noted  . Thrombocytosis ([place]) 07/01/2014  . Protein-calorie malnutrition, severe ([place]) 06/25/2014  . Protein-calorie malnutrition ([place]) 06/24/2014  . Hypokalemia 06/24/2014  . CAP (community acquired pneumonia) 06/24/2014  . H/O hyperthyroidism 12/21/2013  . Diabetes mellitus type 2, noninsulin dependent ([place]) 11/16/2013   Current Outpatient Prescriptions on File Prior to Visit  Medication Sig Dispense Refill  . amLODipine (NORVASC) 5 MG tablet Take 1 tablet (5 mg total) by mouth daily. 90 tablet 3  . atorvastatin (LIPITOR) 40 MG tablet Take 1 tablet (40 mg total) by mouth daily. 90 tablet 1  . atorvastatin (LIPITOR) 40 MG tablet Take 1 tablet (40 mg total) by mouth daily. 90 tablet 1  . glipiZIDE (GLUCOTROL XL) 5 MG 24 hr tablet TAKE ONE TABLET BY MOUTH ONCE DAILY WITH  BREAKFAST 60 tablet 1  . glipiZIDE (GLUCOTROL XL) 5 MG 24 hr tablet TAKE ONE TABLET BY MOUTH BEFORE BREAKFAST AND ONE-HALF TABLET BEFORE DINNER 135 tablet 2  . glucose blood (ONETOUCH VERIO) test strip Check Blood sugar once daily. Dx: E11.9 100 each 3  . metFORMIN (GLUCOPHAGE-XR) 750 MG 24 hr tablet TAKE ONE TABLET BY MOUTH 2x DAILY: WITH BREAKFAST AND DINNER 180 tablet 3  . metFORMIN (GLUCOPHAGE-XR) 750 MG 24 hr tablet TAKE ONE TABLET BY MOUTH ONCE DAILY 60 tablet 2  . NONFORMULARY OR COMPOUNDED [place] compound:  Onychomycosis Nail Lacquer - Fluconazole 2%, Terbinafine 1%, DMSO,  dispense 120 grams, appt to affected area once daily.  +2refills. 120 each 2  . ONETOUCH DELICA LANCETS 10X MISC USE  TO CHECK GLUCOSE TWICE DAILY. DX: E11.9 200 each 3  . ranitidine (ZANTAC) 150 MG tablet Take 1 tablet (150 mg total) by mouth 2 (two) times daily. (Patient taking differently: Take 150 mg by mouth at bedtime. ) 60 tablet 1   No current facility-administered medications on file prior to visit.    Allergies  Allergen Reactions  . Penicillins Nausea And Vomiting   Objective: General: Patient is awake, alert, and oriented x 3 and in no acute distress.  Integument: Skin is warm, dry and supple bilateral. Nails are tender, long, thickened and  dystrophic with subungual debris, consistent with onychomycosis, 1-5 bilateral. No signs of infection. No open lesions or preulcerative lesions present bilateral. Remaining integument unremarkable.  Vasculature:  Dorsalis Pedis pulse 2/4 bilateral. Posterior Tibial pulse  1/4 bilateral.  Capillary fill time <3 sec 1-5 bilateral. Positive hair growth to the level of the digits. Temperature gradient within normal limits. No varicosities present bilateral. No edema present bilateral.   Neurology: The patient has intact sensation measured with a 5.07/10g Semmes Weinstein Monofilament at all pedal sites bilateral . Vibratory sensation intact bilateral with tuning fork. No Babinski sign present bilateral.   Musculoskeletal: Asymptomatic mild bunion deformities noted bilateral, R>L. Muscular strength 5/5 in all lower extremity muscular groups bilateral without pain or limitation on range of motion . No tenderness with calf compression bilateral.  Assessment  and Plan: Problem List Items Addressed This Visit    None    Visit Diagnoses    Dermatophytosis of nail    -  Primary   Foot pain, bilateral       Diabetes mellitus without complication ([place])         -Examined patient. -Discussed and educated patient on diabetic foot care, especially  with regards to the vascular, neurological and musculoskeletal systems.  -Stressed the importance of good glycemic control and the detriment of not controlling glucose levels in relation to the foot. -Mechanically debrided all nails 1-5 bilateral using sterile nail nipper and filed with dremel without incident  -Cont with nail lacquer for onychomycosis ([place]) and recommended vinegar soaks -Answered all patient questions -Patient to return in 3 months for at risk foot care -Patient advised to call the office if any problems or questions arise in the meantime.  Brandi Mccormick, DPM

## 2016-07-13 ENCOUNTER — Other Ambulatory Visit: Payer: Self-pay | Admitting: Family Medicine

## 2016-07-13 DIAGNOSIS — E785 Hyperlipidemia, unspecified: Secondary | ICD-10-CM

## 2016-07-16 ENCOUNTER — Encounter: Payer: Self-pay | Admitting: Family Medicine

## 2016-09-03 DIAGNOSIS — R69 Illness, unspecified: Secondary | ICD-10-CM | POA: Diagnosis not present

## 2016-09-04 DIAGNOSIS — R69 Illness, unspecified: Secondary | ICD-10-CM | POA: Diagnosis not present

## 2016-09-17 DIAGNOSIS — R69 Illness, unspecified: Secondary | ICD-10-CM | POA: Diagnosis not present

## 2016-09-27 ENCOUNTER — Ambulatory Visit: Payer: Medicare HMO | Admitting: Internal Medicine

## 2016-10-08 ENCOUNTER — Encounter: Payer: Self-pay | Admitting: Sports Medicine

## 2016-10-08 ENCOUNTER — Ambulatory Visit (INDEPENDENT_AMBULATORY_CARE_PROVIDER_SITE_OTHER): Payer: Medicare HMO | Admitting: Sports Medicine

## 2016-10-08 DIAGNOSIS — E119 Type 2 diabetes mellitus without complications: Secondary | ICD-10-CM

## 2016-10-08 DIAGNOSIS — B351 Tinea unguium: Secondary | ICD-10-CM

## 2016-10-08 DIAGNOSIS — M79671 Pain in right foot: Secondary | ICD-10-CM

## 2016-10-08 DIAGNOSIS — M79672 Pain in left foot: Secondary | ICD-10-CM

## 2016-10-08 NOTE — Progress Notes (Signed)
Patient ID: Brandi Mccormick, female   DOB: Dec 23, 1940, 76 y.o.   MRN: [id number]   Subjective: Brandi Mccormick is a 76 y.o. female patient with history of type 2 diabetes who presents to office today complaining of thick, long, painful nails  while ambulating in shoes; unable to trim. Patient states that the glucose reading this morning was 151. Patient denies any new changes in medication or new problems. Patient denies any new cramping, numbness, burning or tingling in the legs.  Patient Active Problem List   Diagnosis Date Noted  . Thrombocytosis ([place]) 07/01/2014  . Protein-calorie malnutrition, severe ([place]) 06/25/2014  . Protein-calorie malnutrition ([place]) 06/24/2014  . Hypokalemia 06/24/2014  . CAP (community acquired pneumonia) 06/24/2014  . H/O hyperthyroidism 12/21/2013  . Diabetes mellitus type 2, noninsulin dependent ([place]) 11/16/2013   Current Outpatient Prescriptions on File Prior to Visit  Medication Sig Dispense Refill  . amLODipine (NORVASC) 5 MG tablet Take 1 tablet (5 mg total) by mouth daily. 90 tablet 3  . atorvastatin (LIPITOR) 40 MG tablet Take 1 tablet (40 mg total) by mouth daily. 90 tablet 1  . atorvastatin (LIPITOR) 40 MG tablet Take 1 tablet (40 mg total) by mouth daily. 90 tablet 1  . glipiZIDE (GLUCOTROL XL) 5 MG 24 hr tablet TAKE ONE TABLET BY MOUTH ONCE DAILY WITH  BREAKFAST 60 tablet 1  . glipiZIDE (GLUCOTROL XL) 5 MG 24 hr tablet TAKE ONE TABLET BY MOUTH BEFORE BREAKFAST AND ONE-HALF TABLET BEFORE DINNER 135 tablet 2  . glucose blood (ONETOUCH VERIO) test strip Check Blood sugar once daily. Dx: E11.9 100 each 3  . metFORMIN (GLUCOPHAGE-XR) 750 MG 24 hr tablet TAKE ONE TABLET BY MOUTH 2x DAILY: WITH BREAKFAST AND DINNER 180 tablet 3  . metFORMIN (GLUCOPHAGE-XR) 750 MG 24 hr tablet TAKE ONE TABLET BY MOUTH ONCE DAILY 60 tablet 2  . NONFORMULARY OR COMPOUNDED [place] compound:  Onychomycosis Nail Lacquer - Fluconazole 2%, Terbinafine 1%, DMSO,  dispense 120 grams, appt to affected area once daily.  +2refills. 120 each 2  . ONETOUCH DELICA LANCETS 18H MISC USE  TO CHECK GLUCOSE TWICE DAILY. DX: E11.9 200 each 3  . ranitidine (ZANTAC) 150 MG tablet Take 1 tablet (150 mg total) by mouth 2 (two) times daily. (Patient taking differently: Take 150 mg by mouth at bedtime. ) 60 tablet 1   No current facility-administered medications on file prior to visit.    Allergies  Allergen Reactions  . Penicillins Nausea And Vomiting   Objective: General: Patient is awake, alert, and oriented x 3 and in no acute distress.  Integument: Skin is warm, dry and supple bilateral. Nails are tender, long, thickened and  dystrophic with subungual debris, consistent with onychomycosis, 1-5 bilateral. No signs of infection. No open lesions or preulcerative lesions present bilateral. Remaining integument unremarkable.  Vasculature:  Dorsalis Pedis pulse 2/4 bilateral. Posterior Tibial pulse  1/4 bilateral.  Capillary fill time <3 sec 1-5 bilateral. Positive hair growth to the level of the digits. Temperature gradient within normal limits. No varicosities present bilateral. No edema present bilateral.   Neurology: The patient has intact sensation measured with a 5.07/10g Semmes Weinstein Monofilament at all pedal sites bilateral . Vibratory sensation intact bilateral with tuning fork. No Babinski sign present bilateral.   Musculoskeletal: Asymptomatic mild bunion deformities noted bilateral, R>L. Muscular strength 5/5 in all lower extremity muscular groups bilateral without pain or limitation on range of motion . No tenderness with calf compression bilateral.  Assessment  and Plan: Problem List Items Addressed This Visit    None    Visit Diagnoses    Dermatophytosis of nail    -  Primary   Foot pain, bilateral       Diabetes mellitus without complication ([place])         -Examined patient. -Discussed and educated patient on diabetic foot care, especially  with regards to the vascular, neurological and musculoskeletal systems.  -Stressed the importance of good glycemic control and the detriment of not controlling glucose levels in relation to the foot. -Mechanically debrided all nails 1-5 bilateral using sterile nail nipper and filed with dremel without incident  -Cont with nail lacquer for onychomycosis ([place]) and recommended cont vinegar soaks -Answered all patient questions -Patient to return in 3 months for at risk foot care -Patient advised to call the office if any problems or questions arise in the meantime.  [name], DPM

## 2016-10-24 DIAGNOSIS — R69 Illness, unspecified: Secondary | ICD-10-CM | POA: Diagnosis not present

## 2016-11-05 ENCOUNTER — Other Ambulatory Visit: Payer: Self-pay

## 2016-11-05 ENCOUNTER — Telehealth: Payer: Self-pay | Admitting: Internal Medicine

## 2016-11-05 MED ORDER — METFORMIN HCL ER 750 MG PO TB24: 750.0000 mg | ORAL_TABLET | Freq: Every day | ORAL | 2 refills | Status: DC

## 2016-11-05 NOTE — Telephone Encounter (Signed)
Patient needs Rx refilled: metFORMIN (GLUCOPHAGE-XR) 750 MG 24 hr tablet 60 tablet       Sent to: [facility] ([address]), [place] [phone] (Phone) [phone] (Fax)

## 2016-11-05 NOTE — Telephone Encounter (Signed)
Submitted

## 2016-11-15 ENCOUNTER — Encounter: Payer: Self-pay | Admitting: Internal Medicine

## 2016-11-15 ENCOUNTER — Ambulatory Visit (INDEPENDENT_AMBULATORY_CARE_PROVIDER_SITE_OTHER): Payer: Medicare HMO | Admitting: Internal Medicine

## 2016-11-15 VITALS — BP 128/88 | HR 83 | Ht 64.0 in | Wt 125.0 lb

## 2016-11-15 DIAGNOSIS — Z8639 Personal history of other endocrine, nutritional and metabolic disease: Secondary | ICD-10-CM

## 2016-11-15 DIAGNOSIS — E785 Hyperlipidemia, unspecified: Secondary | ICD-10-CM | POA: Diagnosis not present

## 2016-11-15 DIAGNOSIS — E119 Type 2 diabetes mellitus without complications: Secondary | ICD-10-CM

## 2016-11-15 LAB — LIPID PANEL
CHOLESTEROL: 219 mg/dL — AB (ref 0–200)
HDL: 63.1 mg/dL (ref >39.00)
LDL CALC: 138 mg/dL — AB (ref 0–99)
NonHDL: 156.08
Total CHOL/HDL Ratio: 3
Triglycerides: 92 mg/dL (ref 0.0–149.0)
VLDL: 18.4 mg/dL (ref 0.0–40.0)

## 2016-11-15 LAB — MICROALBUMIN / CREATININE URINE RATIO
Creatinine,U: 120.1 mg/dL
Microalb Creat Ratio: 225.8 mg/g — ABNORMAL HIGH (ref 0.0–30.0)
Microalb, Ur: 271.1 mg/dL — ABNORMAL HIGH (ref 0.0–1.9)

## 2016-11-15 LAB — POCT GLYCOSYLATED HEMOGLOBIN (HGB A1C): HEMOGLOBIN A1C: 7.3

## 2016-11-15 MED ORDER — ATORVASTATIN CALCIUM 40 MG PO TABS: 80.0000 mg | ORAL_TABLET | Freq: Every day | ORAL | 1 refills | Status: DC

## 2016-11-15 MED ORDER — LISINOPRIL 5 MG PO TABS: 5.0000 mg | ORAL_TABLET | Freq: Every day | ORAL | 3 refills | Status: DC

## 2016-11-15 NOTE — Progress Notes (Signed)
Patient ID: Brandi Mccormick, female   DOB: Sep 08, 1940, 76 y.o.   MRN: [id number]  HPI: Brandi Mccormick is a 76 y.o.-year-old female, returning for f/u for DM2, dx 2013, non-insulin-dependent, uncontrolled, without complications and h/o hyperthyroidism. Last visit 5 mo ago.   She had 2 girls at her house x 6 mo >> more stressed >> sugars worse. Now the girls moved out.  She has a h/o hyperthyroidism, tx with meds ~20 years ago. No other history available.  She is asymptomatic. Denies palpitations, tremors, heat intolerance, hyperdefecation.  Recent labs reviewed - normal since at least 2015: Lab Results  Component Value Date   TSH 1.12 06/28/2016   TSH 1.29 06/23/2015   TSH 1.44 10/18/2014   TSH 0.93 08/15/2014   TSH 0.44 02/17/2014   FREET4 0.96 06/28/2016   FREET4 0.91 08/15/2014   FREET4 1.00 02/17/2014   FREET4 0.97 12/20/2013   DM2: Hemoglobin A1c levels: Lab Results  Component Value Date   HGBA1C 6.6 06/28/2016   HGBA1C 7.1 03/28/2016   HGBA1C 7.5 12/29/2015   Pt is on a regimen of: - Metformin ER 750 mg 2x a day - increased 12/2015 - Glipizide XL 5 mg in am - Glipizide XL 1/2 of a 5 mg tablet before dinner.  We tried Tradjenta 5 mg daily in am - too expensive. She was getting diarrhea from Metformin >> now Metformin XR 750 mg in am >> tolerates it well.  Pt now checks her sugars - 2x a day and they are: - am: 103-146, 155 >> 130-140 >> 101-150, 158 >> 127-153 - 2h after b'fast: 107-150 >> 132, 138 >> n/c >> 132-150 >> 150, 151 - before lunch: 134 >> 109-156 >> 140s >> 126-146, 154 >> 129-152 - 2h after lunch: 96-127 >> 130-159 >> n/c >> 109, 142-149, 156 >> 142-156 - before dinner:109-148, 174 >> 94-150 >> 130-140 >> 106-154 >> 79-144 - 2h after dinner: 114-150, 170 >> 104-146, 155 >> n/c >> 132-156 >> 138-158 - bedtime: 133, 160 >> 140-153 >> 140s >> 113, 151 >> 109-150 - nighttime: n/c >> 161 >> n/c Lowest sugar: 128 >> 101 >> 79; she has hypoglycemia  awareness. Highest sugar:  148 >> 158 >> 158  Meter: OneTouch Verio.  Pt's meals are: - Breakfast: egg, coffee, juice; sometime - Lunch: No Lunch - Dinner: meat + vegetables - Snacks: 1-2 Cut down on Pepsi and on sweets.  - no CKD, last BUN/creatinine:  Lab Results  Component Value Date   BUN 16 06/28/2016   CREATININE 0.82 06/28/2016   Lab Results  Component Value Date   MICRALBCREAT 48.8 (H) 06/28/2016   MICRALBCREAT 9.9 02/21/2014   MICRALBCREAT 8.8 11/16/2013   - last set of lipids: Lab Results  Component Value Date   CHOL 251 (H) 06/28/2016   HDL 63.60 06/28/2016   LDLCALC 169 (H) 06/28/2016   TRIG 96.0 06/28/2016   CHOLHDL 4 06/28/2016  Prev. on Simvastatin >> off in 06/2016 b/c fatigue. Started Pravastatin at last visit. - last eye exam was in 11/2015. No DR. Has an appt  Scheduled later this mo. - no numbness and tingling in her feet.  ROS: Constitutional: no weight gain/loss, no fatigue, no subjective hyperthermia/hypothermia Eyes: no blurry vision, no xerophthalmia ENT: no sore throat, no nodules palpated in throat, no dysphagia/odynophagia, no hoarseness Cardiovascular: no CP/SOB/palpitations/leg swelling Respiratory: no cough/SOB Gastrointestinal: no N/V/D/C Musculoskeletal: no muscle/joint aches Skin: no rashes Neurological: no tremors/numbness/tingling/dizziness  I reviewed pt's medications, allergies, PMH,  social hx, family hx, and changes were documented in the history of present illness. Otherwise, unchanged from my initial visit note.  PE: BP 128/88 (BP Location: Left Arm, Patient Position: Sitting)   Pulse 83   Ht 5\' 4"  (1.626 m)   Wt 125 lb (56.7 kg)   SpO2 94%   BMI 21.46 kg/m  Body mass index is 21.46 kg/m. Wt Readings from Last 3 Encounters:  11/15/16 125 lb (56.7 kg)  06/28/16 127 lb (57.6 kg)  03/28/16 128 lb (58.1 kg)   Constitutional: thin, in NAD Eyes: PERRLA, EOMI, no exophthalmos ENT: moist mucous membranes, no  thyromegaly, no cervical lymphadenopathy Cardiovascular: tachycardia, RR, No MRG Respiratory: CTA B Gastrointestinal: abdomen soft, NT, ND, BS+ Musculoskeletal: no deformities, strength intact in all 4 Skin: moist, warm, no rashes Neurological: no tremor with outstretched hands, DTR normal in all 4  ASSESSMENT: 1. DM2, non-insulin-dependent, uncontrolled, without complications  2. H/o Hyperthyroidism - 20 years ago - was on meds, cannot remember name  3. HL  PLAN:  1. Patient with controlled diabetes, on oral antidiabetic regimen, unchanged since last visit. Her sugars are stable throughout the day, with only 1 slightly loe CBG >> unclear why. Overall, the sugars are a little higher, likely due to stress of having house guests for 6 mo >> now gone >> will continue current regimen.  - I suggested to:  Patient Instructions  Please continue: - Glipizide XL 5 mg in am - Glipizide XL 1/2 of a 5 mg tablet before dinner - Metformin ER 750 mg 2x a day  Please return in 3 months with your sugar log.   Please stop at the lab.  - continue checking sugars at different times of the day - check 1-2 times a day, rotating checks - UTD with eye exams - again refuses flu shots (son got sick after it) - will check Lipids, ACR again - will check HbA1c today >> 7.3% (higher!) - Return to clinic in 3 mo with sugar log   2. H/o hyperthyroidism - appears euthyroid - last TFTs normal at last visit >> will repeat  in 1 year from previous  3. HL - we started Pravastatin 5 mo ago >> recheck Lipids today (fasting)  Component     Latest Ref Rng & Units 11/15/2016  Cholesterol     0 - 200 mg/dL 219 (H)  Triglycerides     0.0 - 149.0 mg/dL 92.0  HDL Cholesterol     >39.00 mg/dL 63.10  VLDL     0.0 - 40.0 mg/dL 18.4  LDL (calc)     0 - 99 mg/dL 138 (H)  Total CHOL/HDL Ratio      3  NonHDL      156.08  Microalb, Ur     0.0 - 1.9 mg/dL 271.1 (H)  Creatinine,U     mg/dL 120.1   MICROALB/CREAT RATIO     0.0 - 30.0 mg/g 225.8 (H)  Hemoglobin A1C      7.3  LDL is slightly better, but still high. Would suggest to increase the dose of atorvastatin to 2 tablets of the 40 mg daily. Her ACR is higher than before. I will suggest to start lisinopril 5 mg daily.  Philemon Kingdom, MD PhD [facility]

## 2016-11-15 NOTE — Patient Instructions (Signed)
Please continue: - Glipizide XL 5 mg in am - Glipizide XL 1/2 of a 5 mg tablet before dinner  - Metformin ER 750 mg 2x a day  Please return in 3 months with your sugar log.   Please stop at the lab.

## 2016-11-26 DIAGNOSIS — H2513 Age-related nuclear cataract, bilateral: Secondary | ICD-10-CM | POA: Diagnosis not present

## 2016-11-26 DIAGNOSIS — E119 Type 2 diabetes mellitus without complications: Secondary | ICD-10-CM | POA: Diagnosis not present

## 2016-11-26 DIAGNOSIS — H524 Presbyopia: Secondary | ICD-10-CM | POA: Diagnosis not present

## 2016-11-26 LAB — HM DIABETES EYE EXAM

## 2016-12-05 DIAGNOSIS — R69 Illness, unspecified: Secondary | ICD-10-CM | POA: Diagnosis not present

## 2016-12-09 ENCOUNTER — Other Ambulatory Visit: Payer: Self-pay | Admitting: Internal Medicine

## 2016-12-09 DIAGNOSIS — R69 Illness, unspecified: Secondary | ICD-10-CM | POA: Diagnosis not present

## 2016-12-13 DIAGNOSIS — R69 Illness, unspecified: Secondary | ICD-10-CM | POA: Diagnosis not present

## 2016-12-16 ENCOUNTER — Other Ambulatory Visit: Payer: Self-pay | Admitting: Internal Medicine

## 2016-12-16 DIAGNOSIS — I10 Essential (primary) hypertension: Secondary | ICD-10-CM

## 2016-12-18 ENCOUNTER — Telehealth: Payer: Self-pay | Admitting: Family Medicine

## 2016-12-18 NOTE — Telephone Encounter (Signed)
PT CAME BY MADE APPT 01/14/17 FOR FU FOR REFILL MEDS. PT HAS GENERIC AMLODIPINE 5MG .  ONCE IT IS GONE SHE HAS NO REFILLS REMAINING.

## 2016-12-20 ENCOUNTER — Other Ambulatory Visit: Payer: Self-pay | Admitting: Internal Medicine

## 2016-12-20 DIAGNOSIS — I10 Essential (primary) hypertension: Secondary | ICD-10-CM

## 2017-01-07 ENCOUNTER — Ambulatory Visit: Payer: Medicare HMO | Admitting: Sports Medicine

## 2017-01-14 ENCOUNTER — Ambulatory Visit (INDEPENDENT_AMBULATORY_CARE_PROVIDER_SITE_OTHER): Payer: Medicare HMO | Admitting: Family Medicine

## 2017-01-14 ENCOUNTER — Encounter: Payer: Self-pay | Admitting: Family Medicine

## 2017-01-14 VITALS — BP 132/88 | HR 77 | Temp 98.3°F | Resp 16 | Ht 64.0 in | Wt 127.2 lb

## 2017-01-14 DIAGNOSIS — Z0000 Encounter for general adult medical examination without abnormal findings: Secondary | ICD-10-CM

## 2017-01-14 DIAGNOSIS — I10 Essential (primary) hypertension: Secondary | ICD-10-CM | POA: Diagnosis not present

## 2017-01-14 DIAGNOSIS — E785 Hyperlipidemia, unspecified: Secondary | ICD-10-CM | POA: Diagnosis not present

## 2017-01-14 DIAGNOSIS — I152 Hypertension secondary to endocrine disorders: Secondary | ICD-10-CM | POA: Insufficient documentation

## 2017-01-14 LAB — COMPREHENSIVE METABOLIC PANEL
ALK PHOS: 92 U/L (ref 39–117)
ALT: 21 U/L (ref 0–35)
AST: 20 U/L (ref 0–37)
Albumin: 3.9 g/dL (ref 3.5–5.2)
BUN: 17 mg/dL (ref 6–23)
CHLORIDE: 108 meq/L (ref 96–112)
CO2: 29 mEq/L (ref 19–32)
Calcium: 10 mg/dL (ref 8.4–10.5)
Creatinine, Ser: 0.86 mg/dL (ref 0.40–1.20)
GFR: 68.32 mL/min (ref >60.00)
GLUCOSE: 91 mg/dL (ref 70–99)
Potassium: 5 mEq/L (ref 3.5–5.1)
SODIUM: 143 meq/L (ref 135–145)
Total Bilirubin: 0.7 mg/dL (ref 0.2–1.2)
Total Protein: 7.2 g/dL (ref 6.0–8.3)

## 2017-01-14 LAB — LIPID PANEL
CHOL/HDL RATIO: 4
Cholesterol: 257 mg/dL — ABNORMAL HIGH (ref 0–200)
HDL: 71.6 mg/dL (ref >39.00)
LDL CALC: 161 mg/dL — AB (ref 0–99)
NonHDL: 184.97
Triglycerides: 122 mg/dL (ref 0.0–149.0)
VLDL: 24.4 mg/dL (ref 0.0–40.0)

## 2017-01-14 NOTE — Progress Notes (Signed)
Patient ID: Brandi Mccormick, female   DOB: 01/05/1941, 76 y.o.   MRN: [id number]     Subjective:  I acted as a Education administrator for Dr. Carollee Herter.  Guerry Bruin, [place]   Patient ID: Brandi Mccormick, female    DOB: May 30, 1941, 76 y.o.   MRN: [id number]  Chief Complaint  Patient presents with  . Hypertension  . Diabetes    Hypertension  This is a chronic problem. Pertinent negatives include no blurred vision, chest pain, headaches, malaise/fatigue, palpitations or shortness of breath. Past treatments include ACE inhibitors and calcium channel blockers.  Diabetes  She presents for her follow-up diabetic visit. She has type 2 diabetes mellitus. Pertinent negatives for hypoglycemia include no headaches. Pertinent negatives for diabetes include no blurred vision, no chest pain, no fatigue, no foot ulcerations, no polydipsia, no polyphagia, no polyuria and no weakness. Current diabetic treatment includes oral agent (dual therapy). (Sugars have been ranging in the upper 90s-150s) An ACE inhibitor/angiotensin II receptor blocker is being taken.    Patient is in today for follow up blood pressure and diabetes.    Patient Care Team: Carollee Herter, Alferd Apa, DO as PCP - General (Family Medicine) Philemon Kingdom, MD as Consulting Physician (Internal Medicine) Ara Kussmaul, MD as Consulting Physician (Ophthalmology) [facility] [place] as Consulting Physician (Podiatry)   Past Medical History:  Diagnosis Date  . Pneumonia   . Shingles     Past Surgical History:  Procedure Laterality Date  . DENTAL SURGERY      Family History  Problem Relation Age of Onset  . Diabetes Mother        and on mother's side of family  . Colon cancer Mother   . Stroke Mother   . Diabetes Father        and on father's side of family  . Diabetes Sister   . Diabetes Brother   . Diabetes Sister     Social History   Social History  . Marital status: Married    Spouse name: N/A  .  Number of children: N/A  . Years of education: N/A   Occupational History  . [employer]--     retired   Social History Main Topics  . Smoking status: Never Smoker  . Smokeless tobacco: Never Used  . Alcohol use 0.0 oz/week     Comment: occasional wine cooler  . Drug use: No  . Sexual activity: Yes    Partners: Male     Comment: married-husband    Other Topics Concern  . Not on file   Social History Narrative   Exercise-- yard work,  Walking in [place] Medications Prior to Visit  Medication Sig Dispense Refill  . amLODipine (NORVASC) 5 MG tablet TAKE ONE TABLET BY MOUTH ONCE DAILY 90 tablet 3  . atorvastatin (LIPITOR) 40 MG tablet Take 2 tablets (80 mg total) by mouth daily. 180 tablet 1  . glipiZIDE (GLUCOTROL XL) 5 MG 24 hr tablet TAKE ONE TABLET BY MOUTH BEFORE BREAKFAST AND ONE-HALF TABLET BEFORE DINNER 135 tablet 2  . lisinopril (PRINIVIL,ZESTRIL) 5 MG tablet Take 1 tablet (5 mg total) by mouth daily. 90 tablet 3  . metFORMIN (GLUCOPHAGE-XR) 750 MG 24 hr tablet TAKE ONE TABLET BY MOUTH 2x DAILY: WITH BREAKFAST AND DINNER 180 tablet 3  . NONFORMULARY OR COMPOUNDED [place] compound:  Onychomycosis Nail Lacquer - Fluconazole 2%, Terbinafine 1%, DMSO, dispense 120 grams, appt to affected area once daily.  +  2refills. 120 each 2  . ONETOUCH DELICA LANCETS 44W MISC USE   TO CHECK GLUCOSE TWICE DAILY 100 each 7  . ONETOUCH VERIO test strip USE ONE STRIP TO CHECK GLUCOSE ONCE DAILY 50 each 7  . ranitidine (ZANTAC) 150 MG tablet Take 1 tablet (150 mg total) by mouth 2 (two) times daily. (Patient taking differently: Take 150 mg by mouth at bedtime. ) 60 tablet 1  . glipiZIDE (GLUCOTROL XL) 5 MG 24 hr tablet TAKE ONE TABLET BY MOUTH ONCE DAILY WITH  BREAKFAST 60 tablet 1  . metFORMIN (GLUCOPHAGE-XR) 750 MG 24 hr tablet Take 1 tablet (750 mg total) by mouth daily. 60 tablet 2   No facility-administered medications prior to visit.     Allergies  Allergen  Reactions  . Penicillins Nausea And Vomiting    Review of Systems  Constitutional: Negative for fatigue, fever and malaise/fatigue.  HENT: Negative for congestion.   Eyes: Negative for blurred vision.  Respiratory: Negative for cough and shortness of breath.   Cardiovascular: Negative for chest pain, palpitations and leg swelling.  Gastrointestinal: Negative for vomiting.  Musculoskeletal: Negative for back pain.  Skin: Negative for rash.  Neurological: Negative for loss of consciousness, weakness and headaches.  Endo/Heme/Allergies: Negative for polydipsia and polyphagia.       Objective:    Physical Exam  Constitutional: She is oriented to person, place, and time. She appears well-developed and well-nourished. No distress.  HENT:  Head: Normocephalic and atraumatic.  Eyes: Conjunctivae are normal.  Neck: Normal range of motion. No thyromegaly present.  Cardiovascular: Normal rate and regular rhythm.   Pulmonary/Chest: Effort normal and breath sounds normal. She has no wheezes.  Abdominal: Soft. Bowel sounds are normal. There is no tenderness.  Musculoskeletal: Normal range of motion. She exhibits no edema or deformity.  Neurological: She is alert and oriented to person, place, and time.  Skin: Skin is warm and dry. She is not diaphoretic.  Psychiatric: She has a normal mood and affect.    BP 132/88 (BP Location: Right Arm, Cuff Size: Normal)   Pulse 77   Temp 98.3 F (36.8 C) (Oral)   Resp 16   Ht 5\' 4"  (1.626 m)   Wt 127 lb 3.2 oz (57.7 kg)   SpO2 97%   BMI 21.83 kg/m  Wt Readings from Last 3 Encounters:  01/14/17 127 lb 3.2 oz (57.7 kg)  11/15/16 125 lb (56.7 kg)  06/28/16 127 lb (57.6 kg)   BP Readings from Last 3 Encounters:  01/14/17 132/88  11/15/16 128/88  06/28/16 118/84     Immunization History  Administered Date(s) Administered  . Pneumococcal Polysaccharide-23 11/16/2013    Health Maintenance  Topic Date Due  . COLONOSCOPY  10/03/1991  .  PNA vac Low Risk Adult (2 of 2 - PCV13) 11/17/2014  . INFLUENZA VACCINE  06/28/2017 (Originally 04/02/2017)  . HEMOGLOBIN A1C  05/18/2017  . FOOT EXAM  10/23/2017  . OPHTHALMOLOGY EXAM  11/26/2017  . TETANUS/TDAP  06/22/2025  . DEXA SCAN  Addressed    Lab Results  Component Value Date   WBC 9.2 06/23/2015   HGB 15.8 (H) 06/23/2015   HCT 48.6 (H) 06/23/2015   PLT 697.0 (H) 06/23/2015   GLUCOSE 91 01/14/2017   CHOL 257 (H) 01/14/2017   TRIG 122.0 01/14/2017   HDL 71.60 01/14/2017   LDLCALC 161 (H) 01/14/2017   ALT 21 01/14/2017   AST 20 01/14/2017   NA 143 01/14/2017   K 5.0 01/14/2017  CL 108 01/14/2017   CREATININE 0.86 01/14/2017   BUN 17 01/14/2017   CO2 29 01/14/2017   TSH 1.12 06/28/2016   HGBA1C 7.3 11/15/2016   MICROALBUR 271.1 (H) 11/15/2016    Lab Results  Component Value Date   TSH 1.12 06/28/2016   Lab Results  Component Value Date   WBC 9.2 06/23/2015   HGB 15.8 (H) 06/23/2015   HCT 48.6 (H) 06/23/2015   MCV 87.1 06/23/2015   PLT 697.0 (H) 06/23/2015   Lab Results  Component Value Date   NA 143 01/14/2017   K 5.0 01/14/2017   CO2 29 01/14/2017   GLUCOSE 91 01/14/2017   BUN 17 01/14/2017   CREATININE 0.86 01/14/2017   BILITOT 0.7 01/14/2017   ALKPHOS 92 01/14/2017   AST 20 01/14/2017   ALT 21 01/14/2017   PROT 7.2 01/14/2017   ALBUMIN 3.9 01/14/2017   CALCIUM 10.0 01/14/2017   ANIONGAP 11 06/25/2014   GFR 68.32 01/14/2017   Lab Results  Component Value Date   CHOL 257 (H) 01/14/2017   Lab Results  Component Value Date   HDL 71.60 01/14/2017   Lab Results  Component Value Date   LDLCALC 161 (H) 01/14/2017   Lab Results  Component Value Date   TRIG 122.0 01/14/2017   Lab Results  Component Value Date   CHOLHDL 4 01/14/2017   Lab Results  Component Value Date   HGBA1C 7.3 11/15/2016   +      Assessment & Plan:   Problem List Items Addressed This Visit      Unprioritized   Essential hypertension    Well controlled,  no changes to meds. Encouraged heart healthy diet such as the DASH diet and exercise as tolerated.       Relevant Orders   Lipid panel (Completed)   Comprehensive metabolic panel (Completed)   Hyperlipidemia LDL goal <70 - Primary    Tolerating statin, encouraged heart healthy diet, avoid trans fats, minimize simple carbs and saturated fats. Increase exercise as tolerated      Relevant Orders   Lipid panel (Completed)   Comprehensive metabolic panel (Completed)    Other Visit Diagnoses    Preventative health care       Relevant Orders   Ambulatory referral to Gastroenterology      I am having Ms. Glendenning maintain her ranitidine, NONFORMULARY OR COMPOUNDED ITEM, metFORMIN, glipiZIDE, atorvastatin, lisinopril, ONETOUCH VERIO, ONETOUCH DELICA LANCETS 92E, and amLODipine.  No orders of the defined types were placed in this encounter.   CMA served as Education administrator during this visit. History, Physical and Plan performed by medical provider. Documentation and orders reviewed and attested to.  [name], DO .

## 2017-01-14 NOTE — Assessment & Plan Note (Signed)
Well controlled, no changes to meds. Encouraged heart healthy diet such as the DASH diet and exercise as tolerated.  °

## 2017-01-14 NOTE — Assessment & Plan Note (Signed)
Tolerating statin, encouraged heart healthy diet, avoid trans fats, minimize simple carbs and saturated fats. Increase exercise as tolerated 

## 2017-01-14 NOTE — Patient Instructions (Signed)

## 2017-01-17 ENCOUNTER — Other Ambulatory Visit: Payer: Self-pay | Admitting: Family Medicine

## 2017-01-17 DIAGNOSIS — E119 Type 2 diabetes mellitus without complications: Secondary | ICD-10-CM

## 2017-01-17 DIAGNOSIS — E785 Hyperlipidemia, unspecified: Secondary | ICD-10-CM

## 2017-01-21 ENCOUNTER — Encounter: Payer: Self-pay | Admitting: Physician Assistant

## 2017-01-27 DIAGNOSIS — R69 Illness, unspecified: Secondary | ICD-10-CM | POA: Diagnosis not present

## 2017-01-29 ENCOUNTER — Encounter: Payer: Self-pay | Admitting: Gastroenterology

## 2017-01-29 ENCOUNTER — Ambulatory Visit (INDEPENDENT_AMBULATORY_CARE_PROVIDER_SITE_OTHER): Payer: Medicare HMO | Admitting: Physician Assistant

## 2017-01-29 ENCOUNTER — Encounter: Payer: Self-pay | Admitting: Physician Assistant

## 2017-01-29 VITALS — BP 134/72 | HR 88 | Ht 64.0 in | Wt 123.0 lb

## 2017-01-29 DIAGNOSIS — Z800 Family history of malignant neoplasm of digestive organs: Secondary | ICD-10-CM | POA: Diagnosis not present

## 2017-01-29 DIAGNOSIS — K6289 Other specified diseases of anus and rectum: Secondary | ICD-10-CM

## 2017-01-29 MED ORDER — NA SULFATE-K SULFATE-MG SULF 17.5-3.13-1.6 GM/177ML PO SOLN: 1.0000 | Freq: Once | ORAL | 0 refills | Status: AC

## 2017-01-29 NOTE — Progress Notes (Addendum)
Subjective:    Patient ID: Brandi Mccormick, female    DOB: 13-Jul-1941, 76 y.o.   MRN: [id number]  HPI Brandi Mccormick is a pleasant 76 year old African-American female, referred by Dr. Carollee Herter today to discuss colonoscopy. Patient states that she had 1 previous colonoscopy 5 or 6 years ago but does not remember who did the procedure. It was done in [place] and she was told it was negative. Patient has family history of colon cancer in her mother diagnosed in her late 56s. Patient also with history of hypertension hyperlipidemia and adult onset diabetes mellitus. She currently denies any abdominal pain. She does feel that her abdomen is been getting a little bit bigger but she has not gained any weight. She generally not having any difficulty with constipation or diarrhea and has not noted any melena or hematochezia. She does mention some vague rectal discomfort and says over the past few months she has noticed an auto rectal sensation which she describes as a "wiggling feeling"inside her rectum as if something is moving.  Review of Systems Pertinent positive and negative review of systems were noted in the above HPI section.  All other review of systems was otherwise negative.  Outpatient Encounter Prescriptions as of 01/29/2017  Medication Sig  . amLODipine (NORVASC) 5 MG tablet TAKE ONE TABLET BY MOUTH ONCE DAILY  . atorvastatin (LIPITOR) 40 MG tablet Take 2 tablets (80 mg total) by mouth daily.  Marland Kitchen glipiZIDE (GLUCOTROL XL) 5 MG 24 hr tablet TAKE ONE TABLET BY MOUTH BEFORE BREAKFAST AND ONE-HALF TABLET BEFORE DINNER  . lisinopril (PRINIVIL,ZESTRIL) 5 MG tablet Take 1 tablet (5 mg total) by mouth daily.  . metFORMIN (GLUCOPHAGE-XR) 750 MG 24 hr tablet TAKE ONE TABLET BY MOUTH 2x DAILY: WITH BREAKFAST AND DINNER  . NONFORMULARY OR COMPOUNDED [place] compound:  Onychomycosis Nail Lacquer - Fluconazole 2%, Terbinafine 1%, DMSO, dispense 120 grams, appt to affected area once daily.   +2refills.  Glory Rosebush DELICA LANCETS 37D MISC USE   TO CHECK GLUCOSE TWICE DAILY  . ONETOUCH VERIO test strip USE ONE STRIP TO CHECK GLUCOSE ONCE DAILY  . ranitidine (ZANTAC) 150 MG tablet Take 1 tablet (150 mg total) by mouth 2 (two) times daily. (Patient taking differently: Take 150 mg by mouth at bedtime. )  . Na Sulfate-K Sulfate-Mg Sulf 17.5-3.13-1.6 GM/180ML SOLN Take 1 kit by mouth once.   No facility-administered encounter medications on file as of 01/29/2017.    Allergies  Allergen Reactions  . Penicillins Nausea And Vomiting   Patient Active Problem List   Diagnosis Date Noted  . Essential hypertension 01/14/2017  . Hyperlipidemia LDL goal <70 11/15/2016  . Thrombocytosis ([place]) 07/01/2014  . Protein-calorie malnutrition, severe ([place]) 06/25/2014  . Protein-calorie malnutrition ([place]) 06/24/2014  . Hypokalemia 06/24/2014  . CAP (community acquired pneumonia) 06/24/2014  . H/O hyperthyroidism 12/21/2013  . Diabetes mellitus type 2, noninsulin dependent ([place]) 11/16/2013   Social History   Social History  . Marital status: Married    Spouse name: N/A  . Number of children: N/A  . Years of education: N/A   Occupational History  . A[employer]--     retired   Social History Main Topics  . Smoking status: Never Smoker  . Smokeless tobacco: Never Used  . Alcohol use 0.0 oz/week     Comment: occasional wine cooler  . Drug use: No  . Sexual activity: Yes    Partners: Male     Comment: married-husband    Other Topics  Concern  . Not on file   Social History Narrative   Exercise-- yard work,  Walking in [place]    Ms. Dina's family history includes Colon cancer in her mother; Diabetes in her brother, father, mother, sister, and sister; Stroke in her mother.      Objective:    Vitals:   01/29/17 1114  BP: 134/72  Pulse: 88    Physical Exam  well-developed African-American female in no acute distress, pleasant blood pressure 134/72 pulse 88, height 5 foot 4, BMI  21.1, weight 123. HEENT; nontraumatic normocephalic EOMI PERRLA sclera anicteric, Cardiovascular; regular rate and rhythm with S1-S2 no murmur or gallop, Pulmonary ;clear bilaterally, Abdomen ;soft, nontender nondistended bowel sounds are active there is no palpable mass or hepatosplenomegaly, Rectal ;exam not done, Extremities ;no clubbing cyanosis or edema skin warm and dry, Neuropsych; mood and affect appropriate       Assessment & Plan:   #69 76 year old African-American female with positive family history of colon cancer in her mother who is referred today for colonoscopy. Patient has had 1 previous colonoscopy done at [facility]-reported[facility] 5-6 years ago and negative. #2 recent intermittent rectal discomfort/question prurutis-with sensation of movement #3 hypertension #4 hyperlipidemia #5 adult-onset diabetes mellitus  Plan; we'll check stool for O&P Patient signed a release and will obtain copy of her previous colonoscopy Will schedule for colonoscopy with Dr. Havery Moros. Procedure discussed in detail with the patient including risks and benefits and she is agreeable to proceed.   Addendum; previous colonoscopy record obtained-colonoscopy October 2009 per Dr. Amedeo Plenty with finding of sigmoid diverticulosis, no colon polyps.  Amy S Esterwood PA-C 01/29/2017   Cc: Carollee Herter, Alferd Apa, *

## 2017-01-29 NOTE — Patient Instructions (Addendum)
Please go to the basement level to have our lab for a stool study.  You have been scheduled for a colonoscopy. Please follow written instructions given to you at your visit today.  Please pick up your prep supplies at the pharmacy within the next 1-3 days. [place] :[place]. If you use inhalers (even only as needed), please bring them with you on the day of your procedure. Your physician has requested that you go to www.startemmi.com and enter the access code given to you at your visit today. This web site gives a general overview about your procedure. However, you should still follow specific instructions given to you by our office regarding your preparation for the procedure.

## 2017-01-30 NOTE — Progress Notes (Signed)
Agree with note as outlined.

## 2017-02-04 ENCOUNTER — Ambulatory Visit (INDEPENDENT_AMBULATORY_CARE_PROVIDER_SITE_OTHER): Payer: Medicare HMO | Admitting: Sports Medicine

## 2017-02-04 ENCOUNTER — Encounter: Payer: Self-pay | Admitting: Sports Medicine

## 2017-02-04 ENCOUNTER — Other Ambulatory Visit: Payer: Self-pay | Admitting: Internal Medicine

## 2017-02-04 DIAGNOSIS — E785 Hyperlipidemia, unspecified: Secondary | ICD-10-CM

## 2017-02-04 DIAGNOSIS — M79672 Pain in left foot: Secondary | ICD-10-CM | POA: Diagnosis not present

## 2017-02-04 DIAGNOSIS — B351 Tinea unguium: Secondary | ICD-10-CM

## 2017-02-04 DIAGNOSIS — M79671 Pain in right foot: Secondary | ICD-10-CM

## 2017-02-04 DIAGNOSIS — E119 Type 2 diabetes mellitus without complications: Secondary | ICD-10-CM

## 2017-02-04 NOTE — Progress Notes (Signed)
Patient ID: Brandi Mccormick, female   DOB: 1941-04-11, 76 y.o.   MRN: [id number]   Subjective: Brandi Mccormick is a 76 y.o. female patient with history of type 2 diabetes who presents to office today complaining of thick, long, painful nails  while ambulating in shoes; unable to trim. Patient states that the glucose reading this morning was "good". Patient denies any new changes in medication or new problems. Patient denies any new cramping, numbness, burning or tingling in the legs. Patient denies any medication changes.  Patient Active Problem List   Diagnosis Date Noted  . Essential hypertension 01/14/2017  . Hyperlipidemia LDL goal <70 11/15/2016  . Thrombocytosis ([place]) 07/01/2014  . Protein-calorie malnutrition, severe ([place]) 06/25/2014  . Protein-calorie malnutrition ([place]) 06/24/2014  . Hypokalemia 06/24/2014  . CAP (community acquired pneumonia) 06/24/2014  . H/O hyperthyroidism 12/21/2013  . Diabetes mellitus type 2, noninsulin dependent ([place]) 11/16/2013   Current Outpatient Prescriptions on File Prior to Visit  Medication Sig Dispense Refill  . amLODipine (NORVASC) 5 MG tablet TAKE ONE TABLET BY MOUTH ONCE DAILY 90 tablet 3  . atorvastatin (LIPITOR) 40 MG tablet Take 2 tablets (80 mg total) by mouth daily. 180 tablet 1  . glipiZIDE (GLUCOTROL XL) 5 MG 24 hr tablet TAKE ONE TABLET BY MOUTH BEFORE BREAKFAST AND ONE-HALF TABLET BEFORE DINNER 135 tablet 2  . lisinopril (PRINIVIL,ZESTRIL) 5 MG tablet Take 1 tablet (5 mg total) by mouth daily. 90 tablet 3  . metFORMIN (GLUCOPHAGE-XR) 750 MG 24 hr tablet TAKE ONE TABLET BY MOUTH 2x DAILY: WITH BREAKFAST AND DINNER 180 tablet 3  . NONFORMULARY OR COMPOUNDED [place] compound:  Onychomycosis Nail Lacquer - Fluconazole 2%, Terbinafine 1%, DMSO, dispense 120 grams, appt to affected area once daily.  +2refills. 120 each 2  . ONETOUCH DELICA LANCETS 64P MISC USE   TO CHECK GLUCOSE TWICE DAILY 100 each 7  . ONETOUCH VERIO test strip  USE ONE STRIP TO CHECK GLUCOSE ONCE DAILY 50 each 7  . ranitidine (ZANTAC) 150 MG tablet Take 1 tablet (150 mg total) by mouth 2 (two) times daily. (Patient taking differently: Take 150 mg by mouth at bedtime. ) 60 tablet 1   No current facility-administered medications on file prior to visit.    Allergies  Allergen Reactions  . Penicillins Nausea And Vomiting   Objective: General: Patient is awake, alert, and oriented x 3 and in no acute distress.  Integument: Skin is warm, dry and supple bilateral. Nails are tender, long, thickened and  dystrophic with subungual debris, consistent with onychomycosis, 1-5 bilateral. No signs of infection. No open lesions or preulcerative lesions present bilateral. Remaining integument unremarkable.  Vasculature:  Dorsalis Pedis pulse 2/4 bilateral. Posterior Tibial pulse  1/4 bilateral.  Capillary fill time <3 sec 1-5 bilateral. Positive hair growth to the level of the digits. Temperature gradient within normal limits. No varicosities present bilateral. No edema present bilateral.   Neurology: The patient has intact sensation measured with a 5.07/10g Semmes Weinstein Monofilament at all pedal sites bilateral . Vibratory sensation intact bilateral with tuning fork. No Babinski sign present bilateral.   Musculoskeletal: Asymptomatic mild bunion deformities noted bilateral, R>L. Muscular strength 5/5 in all lower extremity muscular groups bilateral without pain or limitation on range of motion . No tenderness with calf compression bilateral.  Assessment and Plan: Problem List Items Addressed This Visit    None    Visit Diagnoses    Dermatophytosis of nail    -  Primary  Foot pain, bilateral       Diabetes mellitus without complication ([place])         -Examined patient. -Discussed and educated patient on diabetic foot care, especially with regards to the vascular, neurological and musculoskeletal systems.  -Stressed the importance of good glycemic  control and the detriment of not controlling glucose levels in relation to the foot. -Mechanically debrided all nails 1-5 bilateral using sterile nail nipper and filed with dremel without incident  -Cont with nail lacquer for onychomycosis ([place]) and recommended cont vinegar soaks as previously recommended  -Answered all patient questions -Patient to return in 3 months for at risk foot care -Patient advised to call the office if any problems or questions arise in the meantime.  [name], DPM

## 2017-02-05 ENCOUNTER — Ambulatory Visit (AMBULATORY_SURGERY_CENTER): Payer: Medicare HMO | Admitting: Gastroenterology

## 2017-02-05 ENCOUNTER — Encounter: Payer: Self-pay | Admitting: Gastroenterology

## 2017-02-05 VITALS — BP 195/92 | HR 70 | Temp 97.8°F | Resp 15 | Ht 64.0 in | Wt 123.0 lb

## 2017-02-05 DIAGNOSIS — Z1212 Encounter for screening for malignant neoplasm of rectum: Secondary | ICD-10-CM | POA: Diagnosis not present

## 2017-02-05 DIAGNOSIS — D120 Benign neoplasm of cecum: Secondary | ICD-10-CM

## 2017-02-05 DIAGNOSIS — Z1211 Encounter for screening for malignant neoplasm of colon: Secondary | ICD-10-CM | POA: Diagnosis not present

## 2017-02-05 DIAGNOSIS — D125 Benign neoplasm of sigmoid colon: Secondary | ICD-10-CM | POA: Diagnosis not present

## 2017-02-05 DIAGNOSIS — J189 Pneumonia, unspecified organism: Secondary | ICD-10-CM | POA: Insufficient documentation

## 2017-02-05 DIAGNOSIS — Z800 Family history of malignant neoplasm of digestive organs: Secondary | ICD-10-CM

## 2017-02-05 DIAGNOSIS — K6289 Other specified diseases of anus and rectum: Secondary | ICD-10-CM

## 2017-02-05 DIAGNOSIS — K635 Polyp of colon: Secondary | ICD-10-CM | POA: Diagnosis not present

## 2017-02-05 MED ORDER — SODIUM CHLORIDE 0.9 % IV SOLN: 500.0000 mL | INTRAVENOUS | Status: DC

## 2017-02-05 NOTE — Progress Notes (Signed)
Alert and oriented x3, pleased with MAC, report to RN Opal Sidles

## 2017-02-05 NOTE — Progress Notes (Signed)
Called to room to assist during endoscopic procedure.  Patient ID and intended procedure confirmed with present staff. Received instructions for my participation in the procedure from the performing physician.  

## 2017-02-05 NOTE — Progress Notes (Signed)
Pt. BP elevated talked with Dr. Havery Moros. Pt. States she took her BP medication this AM.  She denies any symptoms.  She was advised to recheck BP after she goes home and rests.  She should call her primary care or go to ED if she becomes symptomatic  She and her husband voiced understanding.

## 2017-02-05 NOTE — Op Note (Signed)
[facility] Patient Name: Brandi Mccormick Procedure Date: 02/05/2017 1:52 PM MRN: [id number] Endoscopist: Remo Lipps P. Jedaiah Rathbun MD, MD Age: 76 Referring MD:  Date of Birth: 1941/04/11 Gender: Female Account #: [id number] Procedure:                Colonoscopy Indications:              Screening in patient at increased risk: Family                            history of 1st-degree relative with colorectal                            cancer diagnosed before age 62 years Medicines:                Monitored Anesthesia Care Procedure:                Pre-Anesthesia Assessment:                           - Prior to the procedure, a History and Physical                            was performed, and patient medications and                            allergies were reviewed. The patient's tolerance of                            previous anesthesia was also reviewed. The risks                            and benefits of the procedure and the sedation                            options and risks were discussed with the patient.                            All questions were answered, and informed consent                            was obtained. Prior Anticoagulants: The patient has                            taken no previous anticoagulant or antiplatelet                            agents. ASA Grade Assessment: II - A patient with                            mild systemic disease. After reviewing the risks                            and benefits, the patient was deemed in  satisfactory condition to undergo the procedure.                           After obtaining informed consent, the colonoscope                            was passed under direct vision. Throughout the                            procedure, the patient's blood pressure, pulse, and                            oxygen saturations were monitored continuously. The                            Model PCF-[id number]  [id number]) scope was introduced                            through the anus and advanced to the the cecum,                            identified by appendiceal orifice and ileocecal                            valve. The colonoscopy was performed without                            difficulty. The patient tolerated the procedure                            well. The quality of the bowel preparation was                            adequate. The ileocecal valve, appendiceal orifice,                            and rectum were photographed. Scope In: 1:54:42 PM Scope Out: 2:17:24 PM Scope Withdrawal Time: 0 hours 19 minutes 38 seconds  Total Procedure Duration: 0 hours 22 minutes 42 seconds  Findings:                 The perianal and digital rectal examinations were                            normal.                           Two sessile polyps were found in the cecum. The                            polyps were 2 to 4 mm in size. These polyps were                            removed with a cold snare. Resection and retrieval  were complete.                           A 4 mm polyp was found in the sigmoid colon. The                            polyp was sessile. The polyp was removed with a                            cold snare. Resection and retrieval were complete.                           Scattered medium-mouthed diverticula were found in                            the entire colon.                           Internal hemorrhoids were found during retroflexion.                           The exam was otherwise without abnormality. Complications:            No immediate complications. Estimated blood loss:                            Minimal. Estimated Blood Loss:     Estimated blood loss was minimal. Impression:               - Two 2 to 4 mm polyps in the cecum, removed with a                            cold snare. Resected and retrieved.                           - One  4 mm polyp in the sigmoid colon, removed with                            a cold snare. Resected and retrieved.                           - Diverticulosis in the entire examined colon.                           - Internal hemorrhoids.                           - The examination was otherwise normal. Recommendation:           - Patient has a contact number available for                            emergencies. The signs and symptoms of potential                            delayed complications  were discussed with the                            patient. Return to normal activities tomorrow.                            Written discharge instructions were provided to the                            patient.                           - Resume previous diet.                           - Continue present medications.                           - Await pathology results.                           - Repeat colonoscopy is recommended for                            surveillance. The colonoscopy date will be                            determined after pathology results from today's                            exam become available for review.                           - No ibuprofen, naproxen, or other non-steroidal                            anti-inflammatory drugs for 2 weeks after polyp                            removal. Remo Lipps P. Marco Raper MD, MD 02/05/2017 2:22:10 PM This report has been signed electronically.

## 2017-02-05 NOTE — Patient Instructions (Signed)
YOU HAD AN ENDOSCOPIC PROCEDURE TODAY AT [facility]:   Refer to the procedure report that was given to you for any specific questions about what was found during the examination.  If the procedure report does not answer your questions, please call your gastroenterologist to clarify.  If you requested that your care partner not be given the details of your procedure findings, then the procedure report has been included in a sealed envelope for you to review at your convenience later.  YOU SHOULD EXPECT: Some feelings of bloating in the abdomen. Passage of more gas than usual.  Walking can help get rid of the air that was put into your GI tract during the procedure and reduce the bloating. If you had a lower endoscopy (such as a colonoscopy or flexible sigmoidoscopy) you may notice spotting of blood in your stool or on the toilet paper. If you underwent a bowel prep for your procedure, you may not have a normal bowel movement for a few days.  Please Note:  You might notice some irritation and congestion in your nose or some drainage.  This is from the oxygen used during your procedure.  There is no need for concern and it should clear up in a day or so.  SYMPTOMS TO REPORT IMMEDIATELY:   Following lower endoscopy (colonoscopy or flexible sigmoidoscopy):  Excessive amounts of blood in the stool  Significant tenderness or worsening of abdominal pains  Swelling of the abdomen that is new, acute  Fever of 100F or higher   Following upper endoscopy (EGD)  Vomiting of blood or coffee ground material  New chest pain or pain under the shoulder blades  Painful or persistently difficult swallowing  New shortness of breath  Fever of 100F or higher  Black, tarry-looking stools  For urgent or emergent issues, a gastroenterologist can be reached at any hour by calling [phone].   DIET:  We do recommend a small meal at first, but then you may proceed to your regular diet.  Drink  plenty of fluids but you should avoid alcoholic beverages for 24 hours.  ACTIVITY:  You should plan to take it easy for the rest of today and you should NOT DRIVE or use heavy machinery until tomorrow (because of the sedation medicines used during the test).    FOLLOW UP: Our staff will call the number listed on your records the next business day following your procedure to check on you and address any questions or concerns that you may have regarding the information given to you following your procedure. If we do not reach you, we will leave a message.  However, if you are feeling well and you are not experiencing any problems, there is no need to return our call.  We will assume that you have returned to your regular daily activities without incident.  If any biopsies were taken you will be contacted by phone or by letter within the next 1-3 weeks.  Please call us at [phone] if you have not heard about the biopsies in 3 weeks.    SIGNATURES/CONFIDENTIALITY: You and/or your care partner have signed paperwork which will be entered into your electronic medical record.  These signatures attest to the fact that that the information above on your After Visit Summary has been reviewed and is understood.  Full responsibility of the confidentiality of this discharge information lies with you and/or your care-partner.  Polyp, diverticulosis, and hemorrhoid information given.  No ibuprofen, naproxen or  other non- steroidal anti-inflammatory meds for 2 weeks.

## 2017-02-06 ENCOUNTER — Telehealth: Payer: Self-pay

## 2017-02-06 NOTE — Telephone Encounter (Signed)
  Follow up Call-  Call back number 02/05/2017  Post procedure Call Back phone  # [phone]  Permission to leave phone message Yes  Some recent data might be hidden     Patient questions:  Do you have a fever, pain , or abdominal swelling? No. Pain Score  0 *  Have you tolerated food without any problems? Yes.    Have you been able to return to your normal activities? Yes.    Do you have any questions about your discharge instructions: Diet   No. Medications  No. Follow up visit  No.  Do you have questions or concerns about your Care? No.  Actions: * If pain score is 4 or above: No action needed, pain <4.

## 2017-02-07 ENCOUNTER — Other Ambulatory Visit: Payer: Self-pay | Admitting: Internal Medicine

## 2017-02-07 DIAGNOSIS — E785 Hyperlipidemia, unspecified: Secondary | ICD-10-CM

## 2017-02-12 ENCOUNTER — Encounter: Payer: Self-pay | Admitting: Gastroenterology

## 2017-02-14 ENCOUNTER — Ambulatory Visit (INDEPENDENT_AMBULATORY_CARE_PROVIDER_SITE_OTHER): Payer: Medicare HMO | Admitting: Internal Medicine

## 2017-02-14 ENCOUNTER — Encounter: Payer: Self-pay | Admitting: Internal Medicine

## 2017-02-14 VITALS — BP 128/82 | HR 80 | Ht 64.0 in | Wt 123.0 lb

## 2017-02-14 DIAGNOSIS — Z8639 Personal history of other endocrine, nutritional and metabolic disease: Secondary | ICD-10-CM | POA: Diagnosis not present

## 2017-02-14 DIAGNOSIS — R809 Proteinuria, unspecified: Secondary | ICD-10-CM

## 2017-02-14 DIAGNOSIS — E1129 Type 2 diabetes mellitus with other diabetic kidney complication: Secondary | ICD-10-CM

## 2017-02-14 LAB — POCT GLYCOSYLATED HEMOGLOBIN (HGB A1C): HEMOGLOBIN A1C: 6.9

## 2017-02-14 MED ORDER — LISINOPRIL 5 MG PO TABS: 5.0000 mg | ORAL_TABLET | Freq: Every day | ORAL | 3 refills | Status: DC

## 2017-02-14 NOTE — Addendum Note (Signed)
Addended by: Caprice Beaver T on: 02/14/2017 10:09 AM   Modules accepted: Orders

## 2017-02-14 NOTE — Patient Instructions (Signed)
Please continue: - Glipizide XL 5 mg in am - Glipizide XL 1/2 of a 5 mg tablet before dinner - Metformin ER 750 mg 2x a day  Please return in 3 months with your sugar log.   Please stop at the lab.

## 2017-02-14 NOTE — Progress Notes (Signed)
Patient ID: Brandi Mccormick, female   DOB: February 20, 1941, 76 y.o.   MRN: [id number]  HPI: Brandi Mccormick is a 76 y.o.-year-old female, returning for f/u for DM2, dx 2013, non-insulin-dependent, uncontrolled, with complications (microalbuminuria) and h/o hyperthyroidism. Last visit 3 mo ago.  DM2: Hemoglobin A1c levels: Lab Results  Component Value Date   HGBA1C 7.3 11/15/2016   HGBA1C 6.6 06/28/2016   HGBA1C 7.1 03/28/2016   Pt is on a regimen of: - Metformin ER 750 mg 2x a day - increased 12/2015 - Glipizide XL 5 mg in am - Glipizide XL 2.5 mg tablet before dinner.  We tried Tradjenta 5 mg daily in am - too expensive. She was getting diarrhea from Metformin >> now Metformin XR 750 mg in am >> tolerates it well.  Pt now checks her sugars - 2x a day: - am: 103-146, 155 >> 130-140 >> 101-150, 158 >> 127-153 >> 86-140 - 2h after b'fast: 107-150 >> 132, 138 >> n/c >> 132-150 >> 150, 151 >> 109-147 - before lunch: 134 >> 109-156 >> 140s >> 126-146, 154 >> 129-152 >> 130 - 2h after lunch: 96-127 >> 130-159 >> n/c >> 109, 142-149, 156 >> 142-156 >> 136 - before dinner:109-148, 174 >> 94-150 >> 130-140 >> 106-154 >> 79-144 >> 104-150 (snack) - 2h after dinner: 114-150, 170 >> 104-146, 155 >> n/c >> 132-156 >> 138-158 >>143, 150 - bedtime: 133, 160 >> 140-153 >> 140s >> 113, 151 >> 109-150 >> 103, 128 - nighttime: n/c >> 161 >> n/c Lowest sugar: 128 >> 101 >> 79; she has hypoglycemia awareness. Highest sugar:  148 >> 158 >> 158  Meter: OneTouch Verio.  Pt's meals are: - Breakfast: egg, coffee, juice; sometime - Lunch: No Lunch - Dinner: meat + vegetables - Snacks: 1-2 Cut down on Pepsi and on sweets.  - no CKD, last BUN/creatinine:  Lab Results  Component Value Date   BUN 17 01/14/2017   CREATININE 0.86 01/14/2017   Lab Results  Component Value Date   MICRALBCREAT 225.8 (H) 11/15/2016   MICRALBCREAT 48.8 (H) 06/28/2016   MICRALBCREAT 9.9 02/21/2014   MICRALBCREAT 8.8  11/16/2013  Did not start Lisinopril 5. - last set of lipids: Lab Results  Component Value Date   CHOL 257 (H) 01/14/2017   HDL 71.60 01/14/2017   LDLCALC 161 (H) 01/14/2017   TRIG 122.0 01/14/2017   CHOLHDL 4 01/14/2017  Prev. on Simvastatin >> off in 06/2016 b/c fatigue. On Pravastatin now >> dose increased to 80 mg daily in 12/2016. - last eye exam was in 10/2016. No DR. - she denies numbness and tingling in her feet.  She has a h/o hyperthyroidism, tx with meds ~20 years ago. No other history available.   Pt denies: - weight loss - heat intolerance - tremors - palpitations - anxiety - hyperdefecation - hair loss  Pt denies: - feeling nodules in neck - hoarseness - dysphagia - choking - SOB with lying down  Recent labs reviewed - normal since at least 2015: Lab Results  Component Value Date   TSH 1.12 06/28/2016   TSH 1.29 06/23/2015   TSH 1.44 10/18/2014   TSH 0.93 08/15/2014   TSH 0.44 02/17/2014   TSH 0.28 (L) 12/20/2013   FREET4 0.96 06/28/2016   FREET4 0.91 08/15/2014   FREET4 1.00 02/17/2014   FREET4 0.97 12/20/2013   T3FREE 3.1 06/28/2016   T3FREE 3.2 08/15/2014   T3FREE 2.6 02/17/2014   T3FREE 2.9 12/20/2013   ROS: Constitutional:  no weight gain/no weight loss, no fatigue, no subjective hyperthermia, no subjective hypothermia Eyes: no blurry vision, no xerophthalmia ENT: no sore throat, no nodules palpated in throat, no dysphagia, no odynophagia, no hoarseness Cardiovascular: no CP/no SOB/no palpitations/no leg swelling Respiratory: no cough/no SOB/no wheezing Gastrointestinal: no N/no V/no D/no C/no acid reflux Musculoskeletal: no muscle aches/no joint aches Skin: no rashes, no hair loss Neurological: no tremors/no numbness/no tingling/no dizziness  I reviewed pt's medications, allergies, PMH, social hx, family hx, and changes were documented in the history of present illness. Otherwise, unchanged from my initial visit note.   PE: BP 128/82  (BP Location: Left Arm, Patient Position: Sitting)   Pulse 80   Ht 5\' 4"  (1.626 m)   Wt 123 lb (55.8 kg)   SpO2 96%   BMI 21.11 kg/m  Body mass index is 21.11 kg/m. Wt Readings from Last 3 Encounters:  02/14/17 123 lb (55.8 kg)  02/05/17 123 lb (55.8 kg)  01/29/17 123 lb (55.8 kg)   Constitutional: thin, in NAD Eyes: PERRLA, EOMI, no exophthalmos ENT: moist mucous membranes, no thyromegaly, no cervical lymphadenopathy Cardiovascular: RRR, No MRG Respiratory: CTA B Gastrointestinal: abdomen soft, NT, ND, BS+ Musculoskeletal: no deformities, strength intact in all 4 Skin: moist, warm, no rashes Neurological: no tremor with outstretched hands, DTR normal in all 4  ASSESSMENT: 1. DM2, non-insulin-dependent, uncontrolled, with complications - MAU  2. H/o Hyperthyroidism - 20 years ago - was on meds, cannot remember name  3. HL  PLAN:  1. Patient with controlled diabetes, on oral antidiabetic regimen, now with improved control after her house guests left. All her sugars are at or slightly above goal. No lows.  - will continue current regimen - I suggested to:  Patient Instructions  Please continue: - Glipizide XL 5 mg in am - Glipizide XL 1/2 of a 5 mg tablet before dinner - Metformin ER 750 mg 2x a day  Please return in 3 months with your sugar log.   Please stop at the lab.  - today, HbA1c is 6.9% (better) - continue checking sugars at different times of the day - check 1x a day, rotating checks - advised for yearly eye exams >> she is UTD - we started Lisinopril at last visit (5 mg) >> did not get it from pharmacy >> will re-send. Discussed with her why it is needed (ACR increased) - Return to clinic in 3 mo with sugar log    2. H/o hyperthyroidism - appears euthyroid - reviewed last TSH >> normal >> will repeat at next visit, 1 year from the last check  3. HL - at last visit, I advised her to increase her Atorvastatin to 80 mg >> she only did this after seeing  her PCP in 12/2016. It is too soon to check another lipid panel today...  Philemon Kingdom, MD PhD [facility]

## 2017-02-27 ENCOUNTER — Telehealth: Payer: Self-pay

## 2017-02-27 NOTE — Telephone Encounter (Signed)
Patient is on the list for [employer] 2018 and may be a good candidate for an AWV. Please let me know if/when appt is scheduled.   

## 2017-02-27 NOTE — Telephone Encounter (Signed)
Called pt to schedule awv. Lvm for pt to call office to schedule appt.  °

## 2017-03-04 DIAGNOSIS — H1032 Unspecified acute conjunctivitis, left eye: Secondary | ICD-10-CM | POA: Diagnosis not present

## 2017-03-04 DIAGNOSIS — I10 Essential (primary) hypertension: Secondary | ICD-10-CM | POA: Diagnosis not present

## 2017-03-18 DIAGNOSIS — R69 Illness, unspecified: Secondary | ICD-10-CM | POA: Diagnosis not present

## 2017-03-19 NOTE — Telephone Encounter (Signed)
Awv schedule with [facility] 03/21/17 @8 

## 2017-03-20 NOTE — Progress Notes (Signed)
Subjective:   Brandi Mccormick is a 76 y.o. female who presents for Medicare Annual (Subsequent) preventive examination.  Pt reports left eye irritation. States she feels like something fell in her eye when she was outside. Pt will discuss with PCP today.  Review of Systems:  No ROS.  Medicare Wellness Visit. Additional risk factors are reflected in the social history.  Cardiac Risk Factors include: advanced age (>47men, >38 women);diabetes mellitus;dyslipidemia;hypertension;sedentary lifestyle Sleep patterns: Reports only sleeping 3-4 hrs, but states that is all she needs. Naps occasionally. Declines anything to help her sleep.  Home Safety/Smoke Alarms: Feels safe in home. Smoke alarms in place.  Living environment; residence and Firearm Safety: Lives with husband. 1 story. Guns not discussed.  Seat Belt Safety/Bike Helmet: Wears seat belt.   Counseling:   Eye Exam- Wears reading glasses. Goes to eye doctor yearly for eye exam.  Dental- Dr.Cathey every 6 months.   Female:   Pap-  No longer doing routine screening due to age.    Mammo-   Last 05/09/15: BI-RADS CATEGORY  1: Negative.    Dexa scan- declines       CCS- Last 02/05/17: At least one of the polyps removed was pre-cancerous. Recall 5 yrs     Objective:     Vitals: BP 124/78 (BP Location: Right Arm, Patient Position: Sitting, Cuff Size: Normal)   Pulse 78   Ht 5\' 4"  (1.626 m)   Wt 124 lb 12.8 oz (56.6 kg)   SpO2 97%   BMI 21.42 kg/m   Body mass index is 21.42 kg/m.   Tobacco History  Smoking Status  . Never Smoker  Smokeless Tobacco  . Never Used     Counseling given: Not Answered   Past Medical History:  Diagnosis Date  . Diabetes ([place])   . GERD (gastroesophageal reflux disease)   . Hyperlipidemia   . Hypertension   . Hyperthyroidism   . Pneumonia   . Shingles    Past Surgical History:  Procedure Laterality Date  . COLONOSCOPY    . DENTAL SURGERY     Family History  Problem Relation Age of  Onset  . Diabetes Sister   . Diabetes Brother   . Diabetes Sister   . Diabetes Mother        and on mother's side of family  . Colon cancer Mother   . Stroke Mother   . Diabetes Father        and on father's side of family  . Heart disease Neg Hx   . Rectal cancer Neg Hx   . Stomach cancer Neg Hx    History  Sexual Activity  . Sexual activity: No    Outpatient Encounter Prescriptions as of 03/21/2017  Medication Sig  . amLODipine (NORVASC) 5 MG tablet TAKE ONE TABLET BY MOUTH ONCE DAILY  . atorvastatin (LIPITOR) 40 MG tablet Take 2 tablets (80 mg total) by mouth daily.  Marland Kitchen glipiZIDE (GLUCOTROL XL) 5 MG 24 hr tablet TAKE ONE TABLET BY MOUTH BEFORE BREAKFAST AND ONE-HALF TABLET BEFORE DINNER  . lisinopril (PRINIVIL,ZESTRIL) 5 MG tablet Take 1 tablet (5 mg total) by mouth daily.  . metFORMIN (GLUCOPHAGE-XR) 750 MG 24 hr tablet TAKE ONE TABLET BY MOUTH 2x DAILY: WITH BREAKFAST AND DINNER  . NONFORMULARY OR COMPOUNDED [place] compound:  Onychomycosis Nail Lacquer - Fluconazole 2%, Terbinafine 1%, DMSO, dispense 120 grams, appt to affected area once daily.  +2refills.  Glory Rosebush DELICA LANCETS 95M MISC USE  TO CHECK GLUCOSE TWICE DAILY  . ONETOUCH VERIO test strip USE ONE STRIP TO CHECK GLUCOSE ONCE DAILY  . ranitidine (ZANTAC) 150 MG tablet Take 1 tablet (150 mg total) by mouth 2 (two) times daily. (Patient taking differently: Take 150 mg by mouth at bedtime. )  . [DISCONTINUED] atorvastatin (LIPITOR) 40 MG tablet TAKE ONE TABLET BY MOUTH ONCE DAILY   Facility-Administered Encounter Medications as of 03/21/2017  Medication  . 0.9 %  sodium chloride infusion    Activities of Daily Living In your present state of health, do you have any difficulty performing the following activities: 03/21/2017  Hearing? N  Vision? N  Difficulty concentrating or making decisions? N  Walking or climbing stairs? N  Dressing or bathing? N  Doing errands, shopping? N  Preparing Food and  eating ? N  Using the Toilet? N  In the past six months, have you accidently leaked urine? Y  Do you have problems with loss of bowel control? N  Managing your Medications? N  Managing your Finances? N  Housekeeping or managing your Housekeeping? N  Some recent data might be hidden    Patient Care Team: Carollee Herter, Alferd Apa, DO as PCP - General (Family Medicine) Philemon Kingdom, MD as Consulting Physician (Internal Medicine) Ara Kussmaul, MD as Consulting Physician (Ophthalmology) [facility] as Consulting Physician (Podiatry)    Assessment:    Physical assessment deferred to PCP.  Exercise Activities and Dietary recommendations   Diet (meal preparation, eat out, water intake, caffeinated beverages, dairy products, fruits and vegetables): in general, a "healthy" diet    Goals    . Have 3 meals a day          At lunch add maybe Glucerna, Smoothies with protein, Sandwiches with lean protein/veggies, etc.     . Reduce sugar intake to 25 grams per day          Trade cake and pie for fruit.        Fall Risk Fall Risk  03/21/2017 06/23/2015 06/23/2015 04/24/2015 11/16/2013  Falls in the past year? No No No No No  Risk for fall due to : - - - - History of fall(s)   Depression Screen PHQ 2/9 Scores 03/21/2017 06/23/2015 06/23/2015 04/24/2015  PHQ - 2 Score 0 0 0 0     Cognitive Function MMSE - Mini Mental State Exam 03/21/2017 04/24/2015  Orientation to time 5 5  Orientation to Place 5 5  Registration 3 3  Attention/ Calculation 3 4  Recall 2 2  Language- name 2 objects 2 2  Language- repeat 1 1  Language- follow 3 step command 3 3  Language- read & follow direction 1 1  Write a sentence 1 1  Copy design 1 1  Total score 27 28        Immunization History  Administered Date(s) Administered  . Pneumococcal Polysaccharide-23 11/16/2013   Screening Tests Health Maintenance  Topic Date Due  . PNA vac Low Risk Adult (2 of 2 -  PCV13) 11/17/2014  . INFLUENZA VACCINE  06/28/2017 (Originally 04/02/2017)  . HEMOGLOBIN A1C  08/16/2017  . FOOT EXAM  10/23/2017  . OPHTHALMOLOGY EXAM  11/26/2017  . COLONOSCOPY  02/05/2022  . TETANUS/TDAP  06/22/2025  . DEXA SCAN  Addressed      Plan:   Follow up with PCP today as scheduled.  Continue to eat heart healthy diet (full of fruits, vegetables, whole grains, lean protein, water--limit salt,  fat, and sugar intake) and increase physical activity as tolerated.  Continue doing brain stimulating activities (puzzles, reading, adult coloring books, staying active) to keep memory sharp.   I have ordered your mammogram today. They will call you to schedule.   I have personally reviewed and noted the following in the patient's chart:   . Medical and social history . Use of alcohol, tobacco or illicit drugs  . Current medications and supplements . Functional ability and status . Nutritional status . Physical activity . Advanced directives . List of other physicians . Hospitalizations, surgeries, and ER visits in previous 12 months . Vitals . Screenings to include cognitive, depression, and falls . Referrals and appointments  In addition, I have reviewed and discussed with patient certain preventive protocols, quality metrics, and best practice recommendations. A written personalized care plan for preventive services as well as general preventive health recommendations were provided to patient.     Naaman Plummer [place], [place]  03/21/2017

## 2017-03-21 ENCOUNTER — Encounter: Payer: Self-pay | Admitting: Family Medicine

## 2017-03-21 ENCOUNTER — Ambulatory Visit (INDEPENDENT_AMBULATORY_CARE_PROVIDER_SITE_OTHER): Payer: Medicare HMO | Admitting: Family Medicine

## 2017-03-21 VITALS — BP 124/78 | HR 78 | Ht 64.0 in | Wt 124.8 lb

## 2017-03-21 DIAGNOSIS — I10 Essential (primary) hypertension: Secondary | ICD-10-CM

## 2017-03-21 DIAGNOSIS — Z1231 Encounter for screening mammogram for malignant neoplasm of breast: Secondary | ICD-10-CM | POA: Diagnosis not present

## 2017-03-21 DIAGNOSIS — R7989 Other specified abnormal findings of blood chemistry: Secondary | ICD-10-CM | POA: Diagnosis not present

## 2017-03-21 DIAGNOSIS — E785 Hyperlipidemia, unspecified: Secondary | ICD-10-CM

## 2017-03-21 DIAGNOSIS — E1151 Type 2 diabetes mellitus with diabetic peripheral angiopathy without gangrene: Secondary | ICD-10-CM

## 2017-03-21 DIAGNOSIS — Z1239 Encounter for other screening for malignant neoplasm of breast: Secondary | ICD-10-CM

## 2017-03-21 DIAGNOSIS — S0502XA Injury of conjunctiva and corneal abrasion without foreign body, left eye, initial encounter: Secondary | ICD-10-CM

## 2017-03-21 DIAGNOSIS — Z0001 Encounter for general adult medical examination with abnormal findings: Secondary | ICD-10-CM | POA: Diagnosis not present

## 2017-03-21 DIAGNOSIS — Z0000 Encounter for general adult medical examination without abnormal findings: Secondary | ICD-10-CM

## 2017-03-21 LAB — COMPREHENSIVE METABOLIC PANEL
ALK PHOS: 88 U/L (ref 39–117)
ALT: 30 U/L (ref 0–35)
AST: 33 U/L (ref 0–37)
Albumin: 3.9 g/dL (ref 3.5–5.2)
BILIRUBIN TOTAL: 0.8 mg/dL (ref 0.2–1.2)
BUN: 16 mg/dL (ref 6–23)
CALCIUM: 9.9 mg/dL (ref 8.4–10.5)
CO2: 27 mEq/L (ref 19–32)
Chloride: 103 mEq/L (ref 96–112)
Creatinine, Ser: 0.81 mg/dL (ref 0.40–1.20)
GFR: 73.17 mL/min (ref >60.00)
Glucose, Bld: 151 mg/dL — ABNORMAL HIGH (ref 70–99)
Potassium: 3.7 mEq/L (ref 3.5–5.1)
Sodium: 137 mEq/L (ref 135–145)
TOTAL PROTEIN: 7 g/dL (ref 6.0–8.3)

## 2017-03-21 LAB — CBC WITH DIFFERENTIAL/PLATELET
BASOS ABS: 0.1 10*3/uL (ref 0.0–0.1)
Basophils Relative: 0.6 % (ref 0.0–3.0)
EOS ABS: 0.2 10*3/uL (ref 0.0–0.7)
Eosinophils Relative: 1.7 % (ref 0.0–5.0)
HEMATOCRIT: 50.3 % — AB (ref 36.0–46.0)
Hemoglobin: 16.2 g/dL — ABNORMAL HIGH (ref 12.0–15.0)
LYMPHS ABS: 2.1 10*3/uL (ref 0.7–4.0)
LYMPHS PCT: 20.4 % (ref 12.0–46.0)
MCHC: 32.2 g/dL (ref 30.0–36.0)
MCV: 81.7 fl (ref 78.0–100.0)
MONOS PCT: 8.5 % (ref 3.0–12.0)
Monocytes Absolute: 0.9 10*3/uL (ref 0.1–1.0)
NEUTROS ABS: 7.2 10*3/uL (ref 1.4–7.7)
NEUTROS PCT: 68.8 % (ref 43.0–77.0)
PLATELETS: 871 10*3/uL — AB (ref 150.0–400.0)
RBC: 6.15 Mil/uL — AB (ref 3.87–5.11)
RDW: 19.5 % — ABNORMAL HIGH (ref 11.5–15.5)
WBC: 10.5 10*3/uL (ref 4.0–10.5)

## 2017-03-21 LAB — LIPID PANEL
Cholesterol: 215 mg/dL — ABNORMAL HIGH (ref 0–200)
HDL: 62.2 mg/dL (ref >39.00)
LDL Cholesterol: 133 mg/dL — ABNORMAL HIGH (ref 0–99)
NONHDL: 152.34
TRIGLYCERIDES: 98 mg/dL (ref 0.0–149.0)
Total CHOL/HDL Ratio: 3
VLDL: 19.6 mg/dL (ref 0.0–40.0)

## 2017-03-21 MED ORDER — MOXIFLOXACIN HCL 0.5 % OP SOLN: 1.0000 [drp] | Freq: Three times a day (TID) | OPHTHALMIC | 0 refills | Status: DC

## 2017-03-21 NOTE — Patient Instructions (Signed)
Brandi Mccormick , Thank you for taking time to come for your Medicare Wellness Visit. I appreciate your ongoing commitment to your health goals. Please review the following plan we discussed and let me know if I can assist you in the future.   These are the goals we discussed: Goals    . Have 3 meals a day          At lunch add maybe Glucerna, Smoothies with protein, Sandwiches with lean protein/veggies, etc.     . Reduce sugar intake to 25 grams per day          Trade cake and pie for fruit.         This is a list of the screening recommended for you and due dates:  Health Maintenance  Topic Date Due  . Flu Shot  06/28/2017*  . Pneumonia vaccines (2 of 2 - PCV13) 03/21/2018*  . Hemoglobin A1C  08/16/2017  . Complete foot exam   10/23/2017  . Eye exam for diabetics  11/26/2017  . Colon Cancer Screening  02/05/2022  . Tetanus Vaccine  06/22/2025  . DEXA scan (bone density measurement)  Addressed  *Topic was postponed. The date shown is not the original due date.   Continue to eat heart healthy diet (full of fruits, vegetables, whole grains, lean protein, water--limit salt, fat, and sugar intake) and increase physical activity as tolerated.  Continue doing brain stimulating activities (puzzles, reading, adult coloring books, staying active) to keep memory sharp.   I have ordered your mammogram today. They will call you to schedule.   Health Maintenance for Postmenopausal Women Menopause is a normal process in which your reproductive ability comes to an end. This process happens gradually over a span of months to years, usually between the ages of 33 and 32. Menopause is complete when you have missed 12 consecutive menstrual periods. It is important to talk with your health care provider about some of the most common conditions that affect postmenopausal women, such as heart disease, cancer, and bone loss (osteoporosis). Adopting a healthy lifestyle and getting preventive care can  help to promote your health and wellness. Those actions can also lower your chances of developing some of these common conditions. What should I know about menopause? During menopause, you may experience a number of symptoms, such as:  Moderate-to-severe hot flashes.  Night sweats.  Decrease in sex drive.  Mood swings.  Headaches.  Tiredness.  Irritability.  Memory problems.  Insomnia.  Choosing to treat or not to treat menopausal changes is an individual decision that you make with your health care provider. What should I know about hormone replacement therapy and supplements? Hormone therapy products are effective for treating symptoms that are associated with menopause, such as hot flashes and night sweats. Hormone replacement carries certain risks, especially as you become older. If you are thinking about using estrogen or estrogen with progestin treatments, discuss the benefits and risks with your health care provider. What should I know about heart disease and stroke? Heart disease, heart attack, and stroke become more likely as you age. This may be due, in part, to the hormonal changes that your body experiences during menopause. These can affect how your body processes dietary fats, triglycerides, and cholesterol. Heart attack and stroke are both medical emergencies. There are many things that you can do to help prevent heart disease and stroke:  Have your blood pressure checked at least every 1-2 years. High blood pressure causes heart disease and  increases the risk of stroke.  If you are 18-71 years old, ask your health care provider if you should take aspirin to prevent a heart attack or a stroke.  Do not use any tobacco products, including cigarettes, chewing tobacco, or electronic cigarettes. If you need help quitting, ask your health care provider.  It is important to eat a healthy diet and maintain a healthy weight. ? Be sure to include plenty of vegetables, fruits,  low-fat dairy products, and lean protein. ? Avoid eating foods that are high in solid fats, added sugars, or salt (sodium).  Get regular exercise. This is one of the most important things that you can do for your health. ? Try to exercise for at least 150 minutes each week. The type of exercise that you do should increase your heart rate and make you sweat. This is known as moderate-intensity exercise. ? Try to do strengthening exercises at least twice each week. Do these in addition to the moderate-intensity exercise.  Know your numbers.Ask your health care provider to check your cholesterol and your blood glucose. Continue to have your blood tested as directed by your health care provider.  What should I know about cancer screening? There are several types of cancer. Take the following steps to reduce your risk and to catch any cancer development as early as possible. Breast Cancer  Practice breast self-awareness. ? This means understanding how your breasts normally appear and feel. ? It also means doing regular breast self-exams. Let your health care provider know about any changes, no matter how small.  If you are 52 or older, have a clinician do a breast exam (clinical breast exam or CBE) every year. Depending on your age, family history, and medical history, it may be recommended that you also have a yearly breast X-ray (mammogram).  If you have a family history of breast cancer, talk with your health care provider about genetic screening.  If you are at high risk for breast cancer, talk with your health care provider about having an MRI and a mammogram every year.  Breast cancer (BRCA) gene test is recommended for women who have family members with BRCA-related cancers. Results of the assessment will determine the need for genetic counseling and BRCA1 and for BRCA2 testing. BRCA-related cancers include these types: ? Breast. This occurs in males or females. ? Ovarian. ? Tubal. This  may also be called fallopian tube cancer. ? Cancer of the abdominal or pelvic lining (peritoneal cancer). ? Prostate. ? Pancreatic.  Cervical, Uterine, and Ovarian Cancer Your health care provider may recommend that you be screened regularly for cancer of the pelvic organs. These include your ovaries, uterus, and vagina. This screening involves a pelvic exam, which includes checking for microscopic changes to the surface of your cervix (Pap test).  For women ages 21-65, health care providers may recommend a pelvic exam and a Pap test every three years. For women ages 17-65, they may recommend the Pap test and pelvic exam, combined with testing for human papilloma virus (HPV), every five years. Some types of HPV increase your risk of cervical cancer. Testing for HPV may also be done on women of any age who have unclear Pap test results.  Other health care providers may not recommend any screening for nonpregnant women who are considered low risk for pelvic cancer and have no symptoms. Ask your health care provider if a screening pelvic exam is right for you.  If you have had past treatment for cervical  cancer or a condition that could lead to cancer, you need Pap tests and screening for cancer for at least 20 years after your treatment. If Pap tests have been discontinued for you, your risk factors (such as having a new sexual partner) need to be reassessed to determine if you should start having screenings again. Some women have medical problems that increase the chance of getting cervical cancer. In these cases, your health care provider may recommend that you have screening and Pap tests more often.  If you have a family history of uterine cancer or ovarian cancer, talk with your health care provider about genetic screening.  If you have vaginal bleeding after reaching menopause, tell your health care provider.  There are currently no reliable tests available to screen for ovarian cancer.  Lung  Cancer Lung cancer screening is recommended for adults 59-14 years old who are at high risk for lung cancer because of a history of smoking. A yearly low-dose CT scan of the lungs is recommended if you:  Currently smoke.  Have a history of at least 30 pack-years of smoking and you currently smoke or have quit within the past 15 years. A pack-year is smoking an average of one pack of cigarettes per day for one year.  Yearly screening should:  Continue until it has been 15 years since you quit.  Stop if you develop a health problem that would prevent you from having lung cancer treatment.  Colorectal Cancer  This type of cancer can be detected and can often be prevented.  Routine colorectal cancer screening usually begins at age 81 and continues through age 31.  If you have risk factors for colon cancer, your health care provider may recommend that you be screened at an earlier age.  If you have a family history of colorectal cancer, talk with your health care provider about genetic screening.  Your health care provider may also recommend using home test kits to check for hidden blood in your stool.  A small camera at the end of a tube can be used to examine your colon directly (sigmoidoscopy or colonoscopy). This is done to check for the earliest forms of colorectal cancer.  Direct examination of the colon should be repeated every 5-10 years until age 2. However, if early forms of precancerous polyps or small growths are found or if you have a family history or genetic risk for colorectal cancer, you may need to be screened more often.  Skin Cancer  Check your skin from head to toe regularly.  Monitor any moles. Be sure to tell your health care provider: ? About any new moles or changes in moles, especially if there is a change in a mole's shape or color. ? If you have a mole that is larger than the size of a pencil eraser.  If any of your family members has a history of skin  cancer, especially at a young age, talk with your health care provider about genetic screening.  Always use sunscreen. Apply sunscreen liberally and repeatedly throughout the day.  Whenever you are outside, protect yourself by wearing long sleeves, pants, a wide-brimmed hat, and sunglasses.  What should I know about osteoporosis? Osteoporosis is a condition in which bone destruction happens more quickly than new bone creation. After menopause, you may be at an increased risk for osteoporosis. To help prevent osteoporosis or the bone fractures that can happen because of osteoporosis, the following is recommended:  If you are 23-43 years old,  get at least 1,000 mg of calcium and at least 600 mg of vitamin D per day.  If you are older than age 14 but younger than age 22, get at least 1,200 mg of calcium and at least 600 mg of vitamin D per day.  If you are older than age 80, get at least 1,200 mg of calcium and at least 800 mg of vitamin D per day.  Smoking and excessive alcohol intake increase the risk of osteoporosis. Eat foods that are rich in calcium and vitamin D, and do weight-bearing exercises several times each week as directed by your health care provider. What should I know about how menopause affects my mental health? Depression may occur at any age, but it is more common as you become older. Common symptoms of depression include:  Low or sad mood.  Changes in sleep patterns.  Changes in appetite or eating patterns.  Feeling an overall lack of motivation or enjoyment of activities that you previously enjoyed.  Frequent crying spells.  Talk with your health care provider if you think that you are experiencing depression. What should I know about immunizations? It is important that you get and maintain your immunizations. These include:  Tetanus, diphtheria, and pertussis (Tdap) booster vaccine.  Influenza every year before the flu season begins.  Pneumonia  vaccine.  Shingles vaccine.  Your health care provider may also recommend other immunizations. This information is not intended to replace advice given to you by your health care provider. Make sure you discuss any questions you have with your health care provider. Document Released: 10/11/2005 Document Revised: 03/08/2016 Document Reviewed: 05/23/2015 Elsevier Interactive Patient Education  2018 [employer].

## 2017-03-21 NOTE — Progress Notes (Signed)
Subjective:     Brandi Mccormick is a 76 y.o. female and is here for a comprehensive physical exam. The patient reports problems - feels like there is something in her left eye x 1 week-- after looking up at sky on windy day    HPI HYPERTENSION   Blood pressure range-not checking   Chest pain- no      Dyspnea- no Lightheadedness- no   Edema- no  Other side effects - no   Medication compliance: good Low salt diet- yes    DIABETES    Blood Sugar ranges-88-156  Polyuria- no New Visual problems- no  Hypoglycemic symptoms- no  Other side effects-no Medication compliance - good Last eye exam- 10/2016 Foot exam- today   HYPERLIPIDEMIA  Medication compliance- good RUQ pain- no  Muscle aches- no Other side effects-no  Social History   Social History  . Marital status: Married    Spouse name: N/A  . Number of children: N/A  . Years of education: N/A   Occupational History  . [employer]--     retired   Social History Main Topics  . Smoking status: Never Smoker  . Smokeless tobacco: Never Used  . Alcohol use 0.0 oz/week     Comment: occasional wine cooler  . Drug use: No  . Sexual activity: No   Other Topics Concern  . Not on file   Social History Narrative   Exercise-- yard work,  Walking in [place] Date Due  . INFLUENZA VACCINE  06/28/2017 (Originally 04/02/2017)  . PNA vac Low Risk Adult (2 of 2 - PCV13) 03/21/2018 (Originally 11/17/2014)  . HEMOGLOBIN A1C  08/16/2017  . FOOT EXAM  10/23/2017  . OPHTHALMOLOGY EXAM  11/26/2017  . COLONOSCOPY  02/05/2022  . TETANUS/TDAP  06/22/2025  . DEXA SCAN  Addressed    The following portions of the patient's history were reviewed and updated as appropriate:  She  has a past medical history of Diabetes ([place]); GERD (gastroesophageal reflux disease); Hyperlipidemia; Hypertension; Hyperthyroidism; Pneumonia; and Shingles. She  does not have any pertinent problems on file. She  has a past surgical history  that includes Dental surgery and Colonoscopy. Her family history includes Colon cancer in her mother; Diabetes in her brother, father, mother, sister, and sister; Stroke in her mother. She  reports that she has never smoked. She has never used smokeless tobacco. She reports that she drinks alcohol. She reports that she does not use drugs. She has a current medication list which includes the following prescription(s): amlodipine, atorvastatin, glipizide, lisinopril, metformin, NONFORMULARY OR COMPOUNDED ITEM, onetouch delica lancets 38V, onetouch verio, ranitidine, and moxifloxacin, and the following Facility-Administered Medications: sodium chloride. Current Outpatient Prescriptions on File Prior to Visit  Medication Sig Dispense Refill  . amLODipine (NORVASC) 5 MG tablet TAKE ONE TABLET BY MOUTH ONCE DAILY 90 tablet 3  . atorvastatin (LIPITOR) 40 MG tablet Take 2 tablets (80 mg total) by mouth daily. 180 tablet 1  . glipiZIDE (GLUCOTROL XL) 5 MG 24 hr tablet TAKE ONE TABLET BY MOUTH BEFORE BREAKFAST AND ONE-HALF TABLET BEFORE DINNER 135 tablet 2  . lisinopril (PRINIVIL,ZESTRIL) 5 MG tablet Take 1 tablet (5 mg total) by mouth daily. 90 tablet 3  . metFORMIN (GLUCOPHAGE-XR) 750 MG 24 hr tablet TAKE ONE TABLET BY MOUTH 2x DAILY: WITH BREAKFAST AND DINNER 180 tablet 3  . NONFORMULARY OR COMPOUNDED [place] compound:  Onychomycosis Nail Lacquer - Fluconazole 2%, Terbinafine 1%, DMSO, dispense 120  grams, appt to affected area once daily.  +2refills. 120 each 2  . ONETOUCH DELICA LANCETS 99I MISC USE   TO CHECK GLUCOSE TWICE DAILY 100 each 7  . ONETOUCH VERIO test strip USE ONE STRIP TO CHECK GLUCOSE ONCE DAILY 50 each 7  . ranitidine (ZANTAC) 150 MG tablet Take 1 tablet (150 mg total) by mouth 2 (two) times daily. (Patient taking differently: Take 150 mg by mouth at bedtime. ) 60 tablet 1   Current Facility-Administered Medications on File Prior to Visit  Medication Dose Route Frequency  Provider Last Rate Last Dose  . 0.9 %  sodium chloride infusion  500 mL Intravenous Continuous Armbruster, Renelda Loma, MD       She is allergic to penicillins..  Review of Systems Review of Systems  Constitutional: Negative for activity change, appetite change and fatigue.  HENT: Negative for hearing loss, congestion, tinnitus and ear discharge.  dentist q35m Eyes: Negative for visual disturbance (see optho q1y -- vision corrected to 20/20 with glasses). --- + fb sensation  Respiratory: Negative for cough, chest tightness and shortness of breath.   Cardiovascular: Negative for chest pain, palpitations and leg swelling.  Gastrointestinal: Negative for abdominal pain, diarrhea, constipation and abdominal distention.  Genitourinary: Negative for urgency, frequency, decreased urine volume and difficulty urinating.  Musculoskeletal: Negative for back pain, arthralgias and gait problem.  Skin: Negative for color change, pallor and rash.  Neurological: Negative for dizziness, light-headedness, numbness and headaches.  Hematological: Negative for adenopathy. Does not bruise/bleed easily.  Psychiatric/Behavioral: Negative for suicidal ideas, confusion, sleep disturbance, self-injury, dysphoric mood, decreased concentration and agitation.      Objective:    BP 124/78 (BP Location: Right Arm, Patient Position: Sitting, Cuff Size: Normal)   Pulse 78   Ht 5\' 4"  (1.626 m)   Wt 124 lb 12.8 oz (56.6 kg)   SpO2 97%   BMI 21.42 kg/m  General appearance: alert, cooperative, appears stated age and no distress Head: Normocephalic, without obvious abnormality, atraumatic Eyes: L eye -- + corneal abrasion laterally  --- looked at it with flourescein and woods lamp Ears: normal TM's and external ear canals both ears Nose: Nares normal. Septum midline. Mucosa normal. No drainage or sinus tenderness. Throat: lips, mucosa, and tongue normal; teeth and gums normal Neck: no adenopathy, no carotid bruit, no  JVD, supple, symmetrical, trachea midline and thyroid not enlarged, symmetric, no tenderness/mass/nodules Back: symmetric, no curvature. ROM normal. No CVA tenderness. Lungs: clear to auscultation bilaterally Breasts: normal appearance, no masses or tenderness Heart: regular rate and rhythm, S1, S2 normal, no murmur, click, rub or gallop Abdomen: soft, non-tender; bowel sounds normal; no masses,  no organomegaly Pelvic: not indicated; post-menopausal, no abnormal Pap smears in past Extremities: extremities normal, atraumatic, no cyanosis or edema Pulses: 2+ and symmetric Skin: Skin color, texture, turgor normal. No rashes or lesions Lymph nodes: Cervical, supraclavicular, and axillary nodes normal. Neurologic: Alert and oriented X 3, normal strength and tone. Normal symmetric reflexes. Normal coordination and gait    Assessment:    Healthy female exam.      Plan:     See After Visit Summary for Counseling Recommendations    1. Screening for breast cancer  - MM DIGITAL SCREENING BILATERAL; Future  2. Encounter for Medicare annual wellness exam See above  3. DM (diabetes mellitus) type II, controlled, with peripheral vascular disorder (HCC) hgba1c acceptable, minimize simple carbs. Increase exercise as tolerated. Continue current meds Per endo  - POCT Urinalysis Dipstick (  Automated)  4. Hyperlipidemia LDL goal <70 Tolerating statin, encouraged heart healthy diet, avoid trans fats, minimize simple carbs and saturated fats. Increase exercise as tolerated - Lipid panel - Comprehensive metabolic panel - POCT Urinalysis Dipstick (Automated)  5. Essential hypertension Well controlled, no changes to meds. Encouraged heart healthy diet such as the DASH diet and exercise as tolerated.   - CBC with Differential/Platelet - POCT Urinalysis Dipstick (Automated)  6. Preventative health care See above  7. Abrasion of left cornea, initial encounter vigamox per orders F/u opth if no  improvement in 3-4 days  - moxifloxacin (VIGAMOX) 0.5 % ophthalmic solution; Place 1 drop into the left eye 3 (three) times daily.  Dispense: 3 mL; Refill: 0

## 2017-03-26 MED ORDER — EZETIMIBE 10 MG PO TABS: 10.0000 mg | ORAL_TABLET | Freq: Every day | ORAL | 2 refills | Status: DC

## 2017-03-26 NOTE — Addendum Note (Signed)
Addended byDamita Dunnings D on: 03/26/2017 11:45 AM   Modules accepted: Orders

## 2017-04-03 ENCOUNTER — Other Ambulatory Visit: Payer: Self-pay | Admitting: Internal Medicine

## 2017-04-09 ENCOUNTER — Other Ambulatory Visit: Payer: Self-pay | Admitting: Family

## 2017-04-09 DIAGNOSIS — D473 Essential (hemorrhagic) thrombocythemia: Secondary | ICD-10-CM

## 2017-04-09 DIAGNOSIS — D75839 Thrombocytosis, unspecified: Secondary | ICD-10-CM

## 2017-04-10 ENCOUNTER — Telehealth: Payer: Self-pay | Admitting: Family

## 2017-04-10 ENCOUNTER — Ambulatory Visit (HOSPITAL_BASED_OUTPATIENT_CLINIC_OR_DEPARTMENT_OTHER): Payer: Medicare HMO | Admitting: Family

## 2017-04-10 ENCOUNTER — Ambulatory Visit: Payer: Medicare HMO

## 2017-04-10 ENCOUNTER — Other Ambulatory Visit (HOSPITAL_BASED_OUTPATIENT_CLINIC_OR_DEPARTMENT_OTHER): Payer: Medicare HMO

## 2017-04-10 VITALS — BP 185/77 | HR 78 | Temp 98.2°F | Resp 16 | Wt 125.0 lb

## 2017-04-10 DIAGNOSIS — D473 Essential (hemorrhagic) thrombocythemia: Secondary | ICD-10-CM | POA: Diagnosis not present

## 2017-04-10 DIAGNOSIS — E119 Type 2 diabetes mellitus without complications: Secondary | ICD-10-CM | POA: Diagnosis not present

## 2017-04-10 DIAGNOSIS — D75839 Thrombocytosis, unspecified: Secondary | ICD-10-CM

## 2017-04-10 LAB — CBC WITH DIFFERENTIAL (CANCER CENTER ONLY)
BASO#: 0.1 10*3/uL (ref 0.0–0.2)
BASO%: 0.5 % (ref 0.0–2.0)
EOS ABS: 0.2 10*3/uL (ref 0.0–0.5)
EOS%: 1.6 % (ref 0.0–7.0)
HEMATOCRIT: 48.6 % — AB (ref 34.8–46.6)
HEMOGLOBIN: 16.7 g/dL — AB (ref 11.6–15.9)
LYMPH#: 1.9 10*3/uL (ref 0.9–3.3)
LYMPH%: 18.4 % (ref 14.0–48.0)
MCH: 26.7 pg (ref 26.0–34.0)
MCHC: 34.4 g/dL (ref 32.0–36.0)
MCV: 78 fL — AB (ref 81–101)
MONO#: 0.9 10*3/uL (ref 0.1–0.9)
MONO%: 8.7 % (ref 0.0–13.0)
NEUT%: 70.8 % (ref 39.6–80.0)
NEUTROS ABS: 7.4 10*3/uL — AB (ref 1.5–6.5)
Platelets: 807 10*3/uL — ABNORMAL HIGH (ref 145–400)
RBC: 6.25 10*6/uL — AB (ref 3.70–5.32)
RDW: 19.8 % — ABNORMAL HIGH (ref 11.1–15.7)
WBC: 10.5 10*3/uL — AB (ref 3.9–10.0)

## 2017-04-10 LAB — CMP (CANCER CENTER ONLY)
ALBUMIN: 3.4 g/dL (ref 3.3–5.5)
ALT: 25 U/L (ref 10–47)
AST: 31 U/L (ref 11–38)
Alkaline Phosphatase: 100 U/L — ABNORMAL HIGH (ref 26–84)
BUN, Bld: 12 mg/dL (ref 7–22)
CALCIUM: 9.3 mg/dL (ref 8.0–10.3)
CHLORIDE: 110 meq/L — AB (ref 98–108)
CO2: 29 mEq/L (ref 18–33)
Creat: 0.9 mg/dl (ref 0.6–1.2)
Glucose, Bld: 135 mg/dL — ABNORMAL HIGH (ref 73–118)
POTASSIUM: 3.9 meq/L (ref 3.3–4.7)
Sodium: 138 mEq/L (ref 128–145)
TOTAL PROTEIN: 7.4 g/dL (ref 6.4–8.1)
Total Bilirubin: 0.9 mg/dl (ref 0.20–1.60)

## 2017-04-10 LAB — IRON AND TIBC
%SAT: 19 % — ABNORMAL LOW (ref 21–57)
Iron: 57 ug/dL (ref 41–142)
TIBC: 304 ug/dL (ref 236–444)
UIBC: 247 ug/dL (ref 120–384)

## 2017-04-10 LAB — CHCC SATELLITE - SMEAR

## 2017-04-10 LAB — FERRITIN: FERRITIN: 19 ng/mL (ref 9–269)

## 2017-04-10 NOTE — Telephone Encounter (Signed)
Erroneous encounter

## 2017-04-10 NOTE — Progress Notes (Signed)
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 Her Hgb and WBC count are also mildly elevated.  She is asymptomatic with this.  No issue with frequent infections. No fever, chills, n/v, cough, rash, dizziness, SOB, chest pain, palpitations, abdominal pain or changes in bowel or bladder habits.  No history of thrombus, stroke or heart attack. No episodes of bleeding bruising or petechiae.  No history of liver problems. She states that she still has her spleen.  No personal cancer history. Her mother had colon cancer.  She had her colonoscopy in June and had several polyps removed which were benign.  She is due for her mammogram and will schedule this herself.  No swelling, tenderness, numbness or tingling in her extremities.  She is diabetic and takes metformin and glipizide daily.  She has maintained a good appetite and is staying well hydrated. Her weight is stable.  She has never been a smoker. She has an occasional glass of wine once in a while.  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  She has several grandchildren and great grandchildren that she loves to pieces. She is going with her grandson and great granddaughter to the beach next week as a surprise.   ROS: All other 10 point review of systems is negative.   PAST MEDICAL HISTORY:   Past Medical History:  Diagnosis Date  . Diabetes (HCC)   . GERD (gastroesophageal reflux disease)   . Hyperlipidemia   . Hypertension   . Hyperthyroidism   . Pneumonia   . Shingles     ALLERGIES: Allergies  Allergen  Reactions  . Penicillins Nausea And Vomiting      MEDICATIONS:  Current Outpatient Prescriptions on File Prior to Visit  Medication Sig Dispense Refill  . amLODipine (NORVASC) 5 MG tablet TAKE ONE TABLET BY MOUTH ONCE DAILY 90 tablet 3  . atorvastatin (LIPITOR) 40 MG tablet Take 2 tablets (80 mg total) by mouth daily. 180 tablet 1  . ezetimibe (ZETIA) 10 MG tablet Take 1 tablet (10 mg total) by mouth daily. 30 tablet 2  . glipiZIDE (GLUCOTROL XL) 5 MG 24 hr tablet TAKE ONE TABLET BY MOUTH BEFORE BREAKFAST AND ONE-HALF TABLET BEFORE DINNER 135 tablet 2  . lisinopril (PRINIVIL,ZESTRIL) 5 MG tablet Take 1 tablet (5 mg total) by mouth daily. 90 tablet 3  . metFORMIN (GLUCOPHAGE-XR) 750 MG 24 hr tablet TAKE ONE TABLET BY MOUTH 2x DAILY: WITH BREAKFAST AND DINNER 180 tablet 3  . metFORMIN (GLUCOPHAGE-XR) 750 MG 24 hr tablet TAKE 1 TABLET BY MOUTH ONCE DAILY 60 tablet 2  . moxifloxacin (VIGAMOX) 0.5 % ophthalmic solution Place 1 drop into the left eye 3 (three) times daily. 3 mL 0  . NONFORMULARY OR COMPOUNDED ITEM [facility] compound:  Onychomycosis Nail Lacquer - Fluconazole 2%, Terbinafine 1%, DMSO, dispense 120 grams, appt to affected area once daily.  +2refills. 120 each 2  . ONETOUCH DELICA LANCETS 33G MISC USE   TO CHECK GLUCOSE TWICE DAILY 100 each 7  . ONETOUCH VERIO test strip USE ONE STRIP TO CHECK  GLUCOSE ONCE DAILY 50 each 7  . ranitidine (ZANTAC) 150 MG tablet Take 1 tablet (150 mg total) by mouth 2 (two) times daily. (Patient taking differently: Take 150 mg by mouth at bedtime. ) 60 tablet 1   Current Facility-Administered Medications on File Prior to Visit  Medication Dose Route Frequency Provider Last Rate Last Dose  . 0.9 %  sodium chloride infusion  500 mL Intravenous Continuous Armbruster, Renelda Loma, MD         PAST SURGICAL HISTORY Past Surgical History:  Procedure Laterality Date  . COLONOSCOPY    . DENTAL SURGERY      FAMILY HISTORY: Family History  Problem  Relation Age of Onset  . Diabetes Sister   . Diabetes Brother   . Diabetes Sister   . Diabetes Mother        and on mother's side of family  . Colon cancer Mother   . Stroke Mother   . Diabetes Father        and on father's side of family  . Heart disease Neg Hx   . Rectal cancer Neg Hx   . Stomach cancer Neg Hx     SOCIAL HISTORY:  reports that she has never smoked. She has never used smokeless tobacco. She reports that she drinks alcohol. She reports that she does not use drugs.  PERFORMANCE STATUS: The patient's performance status is 0 - Asymptomatic  PHYSICAL EXAM: Most Recent Vital Signs: Blood pressure (!) 185/77, pulse 78, temperature 98.2 F (36.8 C), temperature source Oral, resp. rate 16, weight 125 lb (56.7 kg), SpO2 100 %. BP (!) 185/77 (BP Location: Left Arm, Patient Position: Sitting)   Pulse 78   Temp 98.2 F (36.8 C) (Oral)   Resp 16   Wt 125 lb (56.7 kg)   SpO2 100%   BMI 21.46 kg/m   General Appearance:    Alert, cooperative, no distress, appears stated age  Head:    Normocephalic, without obvious abnormality, atraumatic  Eyes:    PERRL, conjunctiva/corneas clear, EOM's intact, fundi    benign, both eyes        Throat:   Lips, mucosa, and tongue normal; teeth and gums normal  Neck:   Supple, symmetrical, trachea midline, no adenopathy;    thyroid:  no enlargement/tenderness/nodules; no carotid   bruit or JVD  Back:     Symmetric, no curvature, ROM normal, no CVA tenderness  Lungs:     Clear to auscultation bilaterally, respirations unlabored  Chest Wall:    No tenderness or deformity   Heart:    Regular rate and rhythm, S1 and S2 normal, no murmur, rub   or gallop     Abdomen:     Soft, non-tender, bowel sounds active all four quadrants,    no masses, no organomegaly        Extremities:   Extremities normal, atraumatic, no cyanosis or edema  Pulses:   2+ and symmetric all extremities  Skin:   Skin color, texture, turgor normal, no rashes or  lesions  Lymph nodes:   Cervical, supraclavicular, and axillary nodes normal       LABORATORY DATA:  Results for orders placed or performed in visit on 04/10/17 (from the past 48 hour(s))  CBC w/Diff     Status: Abnormal   Collection Time: 04/10/17  9:02 AM  Result Value Ref Range   WBC 10.5 (H) 3.9 - 10.0 10e3/uL   RBC 6.25 (H) 3.70 - 5.32 10e6/uL   HGB  16.7 (H) 11.6 - 15.9 g/dL   HCT 48.6 (H) 34.8 - 46.6 %   MCV 78 (L) 81 - 101 fL   MCH 26.7 26.0 - 34.0 pg   MCHC 34.4 32.0 - 36.0 g/dL   RDW 19.8 (H) 11.1 - 15.7 %   Platelets 807 (H) 145 - 400 10e3/uL   NEUT# 7.4 (H) 1.5 - 6.5 10e3/uL   LYMPH# 1.9 0.9 - 3.3 10e3/uL   MONO# 0.9 0.1 - 0.9 10e3/uL   Eosinophils Absolute 0.2 0.0 - 0.5 10e3/uL   BASO# 0.1 0.0 - 0.2 10e3/uL   NEUT% 70.8 39.6 - 80.0 %   LYMPH% 18.4 14.0 - 48.0 %   MONO% 8.7 0.0 - 13.0 %   EOS% 1.6 0.0 - 7.0 %   BASO% 0.5 0.0 - 2.0 %      RADIOGRAPHY: No results found.     PATHOLOGY: None  ASSESSMENT/PLAN: Ms. Cranmer is a very pleasant 76 yo African American female with history of thrombocytosis and elevated Hgb and WBC count. We will add an MPN panel today to assess for polycythemia. She is asymptomatic at this time and has no complaints.  She will start taking 1 enteric coated aspirin daily.  We will get an US of the abdomen to assess her liver and spleen.  She may eventually require a bone marrow biopsy but we will see what her blood work shows first.  We will schedule her follow up once we have all her test results.   All questions were answered and she is in agreement with the plan. She will contact our office with any questions or concerns. We can certainly see her sooner if needed.  She was discussed with and also seen by Dr. Marin Olp and he is in agreement with the aforementioned.   [facility]    Addendum:  I agree with the above assessment by Judson Roch. I saw and examined the patient with Judson Roch.  I looked at her blood the microscope. 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 She has large platelets. I do not see any immature myeloid lymphoid cells.  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 Her iron studies are borderline low. 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  We'll will be very critical now will be the genetic markers-JAK2, Calreticulin, and MPL515.  We told her to make sure she starts a baby aspirin.  I think an ultrasound of her abdomen will be helpful to see if there is any splenomegaly. She is a very petite woman. We cannot palpate any obvious splenomegaly.  Of note, her MCV is low. There is no sickle cell in her family. There is no obvious thalassemia. I would suspect that her iron studies might be related to her iron studies.  I don't think we have to do a bone marrow biopsy on her. However, this might be something that we will think about.  I suspect that she probably was going to need to be on some type of marrow suppressive agent.  We will see her back in about 3-4 weeks.  We spent about 45 minutes with her. Again I really believe that she will end up having a myeloproliferative disorder.  Lattie Haw, MD

## 2017-04-11 ENCOUNTER — Other Ambulatory Visit: Payer: Self-pay | Admitting: Family

## 2017-04-11 DIAGNOSIS — D75839 Thrombocytosis, unspecified: Secondary | ICD-10-CM

## 2017-04-11 DIAGNOSIS — D473 Essential (hemorrhagic) thrombocythemia: Secondary | ICD-10-CM

## 2017-04-11 LAB — RETICULOCYTES: Reticulocyte Count: 1.4 % (ref 0.6–2.6)

## 2017-04-11 LAB — C-REACTIVE PROTEIN: CRP: 2.3 mg/L (ref 0.0–4.9)

## 2017-04-11 LAB — SEDIMENTATION RATE: Sedimentation Rate-Westergren: 2 mm/hr (ref 0–40)

## 2017-04-17 ENCOUNTER — Other Ambulatory Visit: Payer: Self-pay | Admitting: Family

## 2017-04-17 NOTE — Progress Notes (Signed)
error 

## 2017-04-18 ENCOUNTER — Other Ambulatory Visit (INDEPENDENT_AMBULATORY_CARE_PROVIDER_SITE_OTHER): Payer: Medicare HMO

## 2017-04-18 DIAGNOSIS — E785 Hyperlipidemia, unspecified: Secondary | ICD-10-CM | POA: Diagnosis not present

## 2017-04-18 DIAGNOSIS — E119 Type 2 diabetes mellitus without complications: Secondary | ICD-10-CM | POA: Diagnosis not present

## 2017-04-18 LAB — COMPREHENSIVE METABOLIC PANEL
ALBUMIN: 3.5 g/dL (ref 3.5–5.2)
ALK PHOS: 81 U/L (ref 39–117)
ALT: 25 U/L (ref 0–35)
AST: 27 U/L (ref 0–37)
BUN: 17 mg/dL (ref 6–23)
CALCIUM: 9.4 mg/dL (ref 8.4–10.5)
CHLORIDE: 105 meq/L (ref 96–112)
CO2: 26 mEq/L (ref 19–32)
Creatinine, Ser: 0.8 mg/dL (ref 0.40–1.20)
GFR: 74.21 mL/min (ref >60.00)
Glucose, Bld: 141 mg/dL — ABNORMAL HIGH (ref 70–99)
POTASSIUM: 3.7 meq/L (ref 3.5–5.1)
SODIUM: 139 meq/L (ref 135–145)
TOTAL PROTEIN: 6.6 g/dL (ref 6.0–8.3)
Total Bilirubin: 0.7 mg/dL (ref 0.2–1.2)

## 2017-04-18 LAB — LIPID PANEL
CHOLESTEROL: 181 mg/dL (ref 0–200)
HDL: 52.7 mg/dL (ref >39.00)
LDL CALC: 115 mg/dL — AB (ref 0–99)
NonHDL: 128.53
TRIGLYCERIDES: 66 mg/dL (ref 0.0–149.0)
Total CHOL/HDL Ratio: 3
VLDL: 13.2 mg/dL (ref 0.0–40.0)

## 2017-04-18 LAB — HEMOGLOBIN A1C: HEMOGLOBIN A1C: 7 % — AB (ref 4.6–6.5)

## 2017-04-21 ENCOUNTER — Ambulatory Visit (HOSPITAL_BASED_OUTPATIENT_CLINIC_OR_DEPARTMENT_OTHER)
Admission: RE | Admit: 2017-04-21 | Discharge: 2017-04-21 | Disposition: A | Discharge status: Home / Self Care | Payer: Medicare HMO | Source: Ambulatory Visit | Attending: Family Medicine | Admitting: Family Medicine

## 2017-04-21 ENCOUNTER — Telehealth: Payer: Self-pay | Admitting: Family Medicine

## 2017-04-21 ENCOUNTER — Other Ambulatory Visit: Payer: Self-pay | Admitting: Family

## 2017-04-21 ENCOUNTER — Encounter (HOSPITAL_BASED_OUTPATIENT_CLINIC_OR_DEPARTMENT_OTHER): Payer: Self-pay

## 2017-04-21 ENCOUNTER — Telehealth: Payer: Self-pay | Admitting: Family

## 2017-04-21 ENCOUNTER — Ambulatory Visit (HOSPITAL_BASED_OUTPATIENT_CLINIC_OR_DEPARTMENT_OTHER)
Admission: RE | Admit: 2017-04-21 | Discharge: 2017-04-21 | Disposition: A | Discharge status: Home / Self Care | Payer: Medicare HMO | Source: Ambulatory Visit | Attending: Family | Admitting: Family

## 2017-04-21 DIAGNOSIS — I700 Atherosclerosis of aorta: Secondary | ICD-10-CM | POA: Insufficient documentation

## 2017-04-21 DIAGNOSIS — Z1231 Encounter for screening mammogram for malignant neoplasm of breast: Secondary | ICD-10-CM | POA: Insufficient documentation

## 2017-04-21 DIAGNOSIS — N2889 Other specified disorders of kidney and ureter: Secondary | ICD-10-CM | POA: Insufficient documentation

## 2017-04-21 DIAGNOSIS — D75839 Thrombocytosis, unspecified: Secondary | ICD-10-CM

## 2017-04-21 DIAGNOSIS — Z1239 Encounter for other screening for malignant neoplasm of breast: Secondary | ICD-10-CM

## 2017-04-21 DIAGNOSIS — D473 Essential (hemorrhagic) thrombocythemia: Secondary | ICD-10-CM | POA: Insufficient documentation

## 2017-04-21 NOTE — Telephone Encounter (Signed)
Patient notified (left a message with her husband) to call in 2 to 3 weeks and schedule nurse visit to check BP on new medication.

## 2017-04-21 NOTE — Telephone Encounter (Signed)
I spole with Brandi Mccormick and went over her [place] report from today with her. ALl questions were answered and we will get an MRI of the abdomen in order to better evaluate the possible right kidney mass. She is in agreement with the plan. We are still awaiting her flow cytometry results.

## 2017-04-21 NOTE — Telephone Encounter (Signed)
-----   Message from [name], DO sent at 04/18/2017  5:02 PM EDT ----- benicar sent to pharmacy to replace diovan Pt need bp check 2-3 weeks

## 2017-04-22 ENCOUNTER — Telehealth: Payer: Self-pay | Admitting: Family Medicine

## 2017-04-22 NOTE — Telephone Encounter (Signed)
Pt called in because she received message from spouse. Advised pt. Not showing that medication has been sent to pharmacy. Pt will schedule bp check once she starts medication.      Pharmacy: [facility] ([facility] [place] [address]

## 2017-04-23 ENCOUNTER — Other Ambulatory Visit: Payer: Self-pay | Admitting: Family Medicine

## 2017-04-23 DIAGNOSIS — I10 Essential (primary) hypertension: Secondary | ICD-10-CM

## 2017-04-23 MED ORDER — OLMESARTAN MEDOXOMIL 40 MG PO TABS: 40.0000 mg | ORAL_TABLET | Freq: Every day | ORAL | 2 refills | Status: DC

## 2017-04-23 NOTE — Telephone Encounter (Signed)
See telephone note dated 04/21/17 benicar was not sent in

## 2017-04-23 NOTE — Telephone Encounter (Signed)
sent 

## 2017-04-23 NOTE — Telephone Encounter (Signed)
Patient informed. 

## 2017-04-26 ENCOUNTER — Ambulatory Visit (HOSPITAL_BASED_OUTPATIENT_CLINIC_OR_DEPARTMENT_OTHER)
Admission: RE | Admit: 2017-04-26 | Discharge: 2017-04-26 | Disposition: A | Discharge status: Home / Self Care | Payer: Medicare HMO | Source: Ambulatory Visit | Attending: Family | Admitting: Family

## 2017-04-26 DIAGNOSIS — N2889 Other specified disorders of kidney and ureter: Secondary | ICD-10-CM | POA: Insufficient documentation

## 2017-04-26 MED ORDER — GADOBENATE DIMEGLUMINE 529 MG/ML IV SOLN
10.0000 mL | Freq: Once | INTRAVENOUS | Status: AC | PRN
  Administered 2017-04-26: 10 mL via INTRAVENOUS

## 2017-04-29 ENCOUNTER — Telehealth: Payer: Self-pay | Admitting: Family

## 2017-04-29 ENCOUNTER — Telehealth: Payer: Self-pay | Admitting: *Deleted

## 2017-04-29 ENCOUNTER — Other Ambulatory Visit: Payer: Self-pay | Admitting: Family

## 2017-04-29 DIAGNOSIS — Z1589 Genetic susceptibility to other disease: Secondary | ICD-10-CM

## 2017-04-29 DIAGNOSIS — D473 Essential (hemorrhagic) thrombocythemia: Secondary | ICD-10-CM

## 2017-04-29 LAB — MYELODYSPLASTIC SYNDROME (MDS) PANEL

## 2017-04-29 NOTE — Telephone Encounter (Signed)
I went over recent lab work and scans with Brandi Mccormick in detail and explained that she was JAK 2 positive with essential thrombocytosis putting her at higher risk of developing a blood clot or having a cardiac event. I explained to her what a phlebotomy was ans that we would like for her to have one to bring down her Hct. She verbalized understanding but states that she wants to get the bills first for her visit, labs and scans first. She is on social security and has a limited income. This is understandable. I will have her speak with Baxter Flattery and see if there is any financial assistance out there for her.

## 2017-04-29 NOTE — Telephone Encounter (Addendum)
Patient is aware of results. She wants to speak to provider before she agrees to any further treatment. Message given to [facility]  ----- Message from Eliezer Bottom, NP sent at 04/29/2017  9:32 AM EDT ----- She was JAK 2 positive, MRI was negative for mass on kidney. LOS sent to [facility] to schedule phlebotomy this week and follow-up in 1 month. Thank you!  Sarah  ----- Message ----- From: Buel Ream, Rad Results In Sent: 04/26/2017   5:36 PM To: Eliezer Bottom, NP

## 2017-05-01 ENCOUNTER — Telehealth: Payer: Self-pay | Admitting: Family Medicine

## 2017-05-01 NOTE — Telephone Encounter (Signed)
D/c benicar avapro 150 mg #30  1 po qd,  2 refills   Ov 2-3 weeks

## 2017-05-01 NOTE — Telephone Encounter (Signed)
Pt called in to make PCP aware that she can not afford her BP medication Brandi Mccormick she said that it is over 100.00   She would like to be advised further.

## 2017-05-01 NOTE — Telephone Encounter (Signed)
YL-Patient  Called stating that she cannot afford Benicar as it is over $100.00/is requesting an alternative/plz advise/thx dmf

## 2017-05-02 MED ORDER — IRBESARTAN 150 MG PO TABS: 150.0000 mg | ORAL_TABLET | Freq: Every day | ORAL | 2 refills | Status: DC

## 2017-05-02 NOTE — Telephone Encounter (Signed)
I LM with female to advise pt to RTC as YL said to Jill Stopka/C Benicar and Start Avapro 150mg  1qd #30+2/will fax when I speak with pt/thx dmf

## 2017-05-02 NOTE — Telephone Encounter (Signed)
Pt aware to Nessie Nong/C Benicar/Start Avapro 150mg  1qd #30+2 faxed/pt will advise if cannot afford/thx dmf

## 2017-05-13 ENCOUNTER — Encounter: Payer: Self-pay | Admitting: Sports Medicine

## 2017-05-13 ENCOUNTER — Ambulatory Visit (INDEPENDENT_AMBULATORY_CARE_PROVIDER_SITE_OTHER): Payer: Medicare HMO | Admitting: Sports Medicine

## 2017-05-13 DIAGNOSIS — E119 Type 2 diabetes mellitus without complications: Secondary | ICD-10-CM | POA: Diagnosis not present

## 2017-05-13 DIAGNOSIS — M79671 Pain in right foot: Secondary | ICD-10-CM

## 2017-05-13 DIAGNOSIS — M79672 Pain in left foot: Secondary | ICD-10-CM

## 2017-05-13 DIAGNOSIS — B351 Tinea unguium: Secondary | ICD-10-CM

## 2017-05-13 NOTE — Progress Notes (Signed)
Patient ID: Brandi Mccormick, female   DOB: Jun 19, 1941, 76 y.o.   MRN: [id number]   Subjective: Brandi Mccormick is a 76 y.o. female patient with history of type 2 diabetes who presents to office today complaining of thick, long, painful nails  while ambulating in shoes; unable to trim. Patient states that the glucose reading this morning was "good". Patient does not know A1c. Patient denies any new changes in medication or new problems.   Patient Active Problem List   Diagnosis Date Noted  . 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 Pneumonia   . Essential hypertension 01/14/2017  . Hyperlipidemia LDL goal <70 11/15/2016  . 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 Protein-calorie malnutrition, severe ([place]) 06/25/2014  . Protein-calorie malnutrition ([place]) 06/24/2014  . Hypokalemia 06/24/2014  . CAP (community acquired pneumonia) 06/24/2014  . H/O hyperthyroidism 12/21/2013  . Diabetes mellitus type 2, noninsulin dependent ([place]) 11/16/2013   Current Outpatient Prescriptions on File Prior to Visit  Medication Sig Dispense Refill  . amLODipine (NORVASC) 5 MG tablet TAKE ONE TABLET BY MOUTH ONCE DAILY 90 tablet 3  . atorvastatin (LIPITOR) 40 MG tablet Take 2 tablets (80 mg total) by mouth daily. 180 tablet 1  . erythromycin ophthalmic ointment erythromycin 5 mg/gram (0.5 %) eye ointment  APPLY 1 CM RIBBON INTO THE LOWER CONJUNCTIVAL SAC(S) IN THE AFFECTED EYE(S) BY OPHTHALMIC ROUTE 4 TIMES A DAY FOR 5 DAYS.    Marland Kitchen ezetimibe (ZETIA) 10 MG tablet Take 1 tablet (10 mg total) by mouth daily. 30 tablet 2  . glipiZIDE (GLUCOTROL XL) 5 MG 24 hr tablet TAKE ONE TABLET BY MOUTH BEFORE BREAKFAST AND ONE-HALF TABLET BEFORE DINNER 135 tablet 2  . irbesartan (AVAPRO) 150 MG tablet Take 1 tablet (150 mg total) by mouth daily. 30 tablet 2  . lisinopril (PRINIVIL,ZESTRIL) 5 MG tablet Take 1 tablet (5 mg total) by mouth daily. 90 tablet 3  . metFORMIN (GLUCOPHAGE-XR) 750 MG 24 hr tablet TAKE ONE TABLET BY MOUTH 2x  DAILY: WITH BREAKFAST AND DINNER 180 tablet 3  . metFORMIN (GLUCOPHAGE-XR) 750 MG 24 hr tablet TAKE 1 TABLET BY MOUTH ONCE DAILY 60 tablet 2  . moxifloxacin (VIGAMOX) 0.5 % ophthalmic solution Place 1 drop into the left eye 3 (three) times daily. 3 mL 0  . NONFORMULARY OR COMPOUNDED [place] compound:  Onychomycosis Nail Lacquer - Fluconazole 2%, Terbinafine 1%, DMSO, dispense 120 grams, appt to affected area once daily.  +2refills. 120 each 2  . ONETOUCH DELICA LANCETS 18A MISC USE   TO CHECK GLUCOSE TWICE DAILY 100 each 7  . ONETOUCH VERIO test strip USE ONE STRIP TO CHECK GLUCOSE ONCE DAILY 50 each 7  . ranitidine (ZANTAC) 150 MG tablet Take 1 tablet (150 mg total) by mouth 2 (two) times daily. (Patient taking differently: Take 150 mg by mouth at bedtime. ) 60 tablet 1   Current Facility-Administered Medications on File Prior to Visit  Medication Dose Route Frequency Provider Last Rate Last Dose  . 0.9 %  sodium chloride infusion  500 mL Intravenous Continuous Armbruster, Carlota Raspberry, MD       Allergies  Allergen Reactions  . Penicillins Nausea And Vomiting   Objective: General: Patient is awake, alert, and oriented x 3 and in no acute distress.  Integument: Skin is warm, dry and supple bilateral. Nails are tender, long, thickened and  dystrophic with subungual debris, consistent with onychomycosis, 1-5 bilateral. No signs of infection. No open lesions or preulcerative lesions present bilateral.  Remaining integument unremarkable.  Vasculature:  Dorsalis Pedis pulse 2/4 bilateral. Posterior Tibial pulse  1/4 bilateral.  Capillary fill time <3 sec 1-5 bilateral. Positive hair growth to the level of the digits. Temperature gradient within normal limits. No varicosities present bilateral. No edema present bilateral.   Neurology: The patient has intact sensation measured with a 5.07/10g Semmes Weinstein Monofilament at all pedal sites bilateral . Vibratory sensation intact  bilateral with tuning fork. No Babinski sign present bilateral.   Musculoskeletal: Asymptomatic mild bunion deformities noted bilateral, R>L. Muscular strength 5/5 in all lower extremity muscular groups bilateral without pain or limitation on range of motion . No tenderness with calf compression bilateral.  Assessment and Plan: Problem List Items Addressed This Visit    None    Visit Diagnoses    Dermatophytosis of nail    -  Primary   Foot pain, bilateral       Diabetes mellitus without complication ([place])         -Examined patient. -Discussed and educated patient on diabetic foot care, especially with regards to the vascular, neurological and musculoskeletal systems.  -Stressed the importance of good glycemic control and the detriment of not controlling glucose levels in relation to the foot. -Mechanically debrided all nails 1-5 bilateral using sterile nail nipper and filed with dremel without incident  -Cont with nail lacquer for onychomycosis ([facility]) and recommended cont vinegar soaks as previously recommended; Patient to call [facility] for a refill -Answered all patient questions -Patient to return in 2.5 months for at risk foot care -Patient advised to call the office if any problems or questions arise in the meantime.  [name], DPM

## 2017-05-16 DIAGNOSIS — R69 Illness, unspecified: Secondary | ICD-10-CM | POA: Diagnosis not present

## 2017-06-02 ENCOUNTER — Other Ambulatory Visit: Payer: Self-pay | Admitting: Internal Medicine

## 2017-06-03 ENCOUNTER — Telehealth: Payer: Self-pay | Admitting: *Deleted

## 2017-06-03 MED ORDER — NONFORMULARY OR COMPOUNDED ITEM: 11 refills | Status: DC

## 2017-06-03 NOTE — Telephone Encounter (Signed)
Received request for refill of [facility] Onychomycosis Nail Lacquer. Dr. Marcene Duos refill as prn. Return faxed [phone].

## 2017-06-09 DIAGNOSIS — R69 Illness, unspecified: Secondary | ICD-10-CM | POA: Diagnosis not present

## 2017-06-13 ENCOUNTER — Telehealth: Payer: Self-pay | Admitting: Sports Medicine

## 2017-06-13 NOTE — Telephone Encounter (Signed)
[facility] states they received an order on 06/04/2017, and their notes do not show anyone speaking with pt. I asked Daisy to contact pt as pt requested on mobile phone. Charleston Ropes states she will call pt and check if order has been sent out.

## 2017-06-13 NOTE — Telephone Encounter (Signed)
I have a medication for my toe that has ran out. I contacted [facility] on 28 September and I also gave them my bank card information. I have not received the medication nor have I heard anything from them. I was wondering if you could tell me what is going on. Please call me on my cell phone as we have no power. That number is [phone]. Thank you.

## 2017-06-16 ENCOUNTER — Ambulatory Visit: Payer: Medicare HMO | Admitting: Internal Medicine

## 2017-07-03 ENCOUNTER — Ambulatory Visit (INDEPENDENT_AMBULATORY_CARE_PROVIDER_SITE_OTHER): Payer: Medicare HMO | Admitting: Internal Medicine

## 2017-07-03 ENCOUNTER — Encounter: Payer: Self-pay | Admitting: Internal Medicine

## 2017-07-03 VITALS — BP 132/84 | HR 92 | Wt 126.0 lb

## 2017-07-03 DIAGNOSIS — Z8639 Personal history of other endocrine, nutritional and metabolic disease: Secondary | ICD-10-CM | POA: Diagnosis not present

## 2017-07-03 DIAGNOSIS — E119 Type 2 diabetes mellitus without complications: Secondary | ICD-10-CM | POA: Diagnosis not present

## 2017-07-03 DIAGNOSIS — R69 Illness, unspecified: Secondary | ICD-10-CM | POA: Diagnosis not present

## 2017-07-03 LAB — T3, FREE: T3, Free: 3.2 pg/mL (ref 2.3–4.2)

## 2017-07-03 LAB — T4, FREE: Free T4: 0.93 ng/dL (ref 0.60–1.60)

## 2017-07-03 LAB — TSH: TSH: 1.13 u[IU]/mL (ref 0.35–4.50)

## 2017-07-03 NOTE — Progress Notes (Signed)
Patient ID: Brandi Mccormick, female   DOB: [birth year], 76 y.o.   MRN: [id number]  HPI: Brandi Mccormick is a 76 y.o.-year-old female, returning for f/u for DM2, dx 2013, non-insulin-dependent, uncontrolled, with complications (microalbuminuria) and h/o hyperthyroidism. Last visit  4.5 mo ago.  DM2: Hemoglobin A1c levels: Lab Results  Component Value Date   HGBA1C 7.0 (H) 04/18/2017   HGBA1C 6.9 02/14/2017   HGBA1C 7.3 11/15/2016   Pt is on a regimen of: - Metformin ER 750 mg 1x a day (she was supposed to take it 2x a day...) - Glipizide XL 5 mg in am - Glipizide XL 2.5 mg tablet before dinner.  We tried Tradjenta 5 mg daily in am - too expensive. She was getting diarrhea from Metformin >> now Metformin XR 750 mg in am >> tolerates it well.  Pt now checks her sugars - 1x a day: - am: 101-150, 158 >> 127-153 >> 86-140 >> 96-132, 141 - 2h after b'fast: 150, 151 >> 109-147 >> 140 - before lunch:  129-152 >> 130 >> 98-134 - 2h after lunch: 142-156 >> 136 >> 93-145 - before dinner: 79-144 >> 104-150 (snack) >> 74, 110-140 - 2h after dinner: 138-158 >>143, 150 >> n/c - bedtime:109-150 >> 103, 128 >> 140, 144 - nighttime: n/c >> 161 >> n/c Lowest sugar: 128 >> 101 >> 79 >> 74; she has hypoglycemia awareness. Highest sugar:  148 >> 158 >> 158 >> 145.  Meter: OneTouch Verio.  Pt's meals are: - Breakfast: egg, coffee, juice; sometime - Lunch: No Lunch - Dinner: meat + vegetables - Snacks: 1-2 Cut down on Pepsi and on sweets.  - + CKD, last BUN/creatinine:  Lab Results  Component Value Date   BUN 17 04/18/2017   CREATININE 0.80 04/18/2017  She has microalbuminuria: Lab Results  Component Value Date   MICRALBCREAT 225.8 (H) 11/15/2016   MICRALBCREAT 48.8 (H) 06/28/2016   MICRALBCREAT 9.9 02/21/2014   MICRALBCREAT 8.8 11/16/2013  On Irbesartan. - + HL; ast set of lipids: Lab Results  Component Value Date   CHOL 181 04/18/2017   HDL 52.70 04/18/2017   LDLCALC 115 (H)  04/18/2017   TRIG 66.0 04/18/2017   CHOLHDL 3 04/18/2017  Prev. on Simvastatin >> off in 06/2016 b/c fatigue. On Pravastatin now >> dose increased to 80 mg daily in 12/2016. Also on Zetia - started this summer. - No numbness and tingling in her feet. - Last eye exam 10/2016 >> No DR.  She has a h/o hyperthyroidism, tx with meds approximately 20 years ago. No other history available.  Pt denies: - feeling nodules in neck - hoarseness - dysphagia - choking - SOB with lying down  Recent labs reviewed - normal since at least 2015: Lab Results  Component Value Date   TSH 1.12 06/28/2016   TSH 1.29 06/23/2015   TSH 1.44 10/18/2014   TSH 0.93 08/15/2014   TSH 0.44 02/17/2014   TSH 0.28 (L) 12/20/2013   FREET4 0.96 06/28/2016   FREET4 0.91 08/15/2014   FREET4 1.00 02/17/2014   FREET4 0.97 12/20/2013   T3FREE 3.1 06/28/2016   T3FREE 3.2 08/15/2014   T3FREE 2.6 02/17/2014   T3FREE 2.9 12/20/2013   ROS: Constitutional: no weight gain/no weight loss, no fatigue, no subjective hyperthermia, no subjective hypothermia Eyes: no blurry vision, no xerophthalmia ENT: no sore throat, no nodules palpated in throat, no dysphagia, no odynophagia, no hoarseness Cardiovascular: no CP/no SOB/no palpitations/no leg swelling Respiratory: no cough/no SOB/no wheezing Gastrointestinal:  no N/no V/no D/no C/no acid reflux Musculoskeletal: no muscle aches/no joint aches Skin: no rashes, no hair loss Neurological: no tremors/no numbness/no tingling/no dizziness  I reviewed pt's medications, allergies, PMH, social hx, family hx, and changes were documented in the history of present illness. Otherwise, unchanged from my initial visit note.  PE: BP 132/84 (BP Location: Left Arm, Patient Position: Sitting)   Pulse 92   Wt 126 lb (57.2 kg)   SpO2 96%   BMI 21.63 kg/m  Body mass index is 21.63 kg/m. Wt Readings from Last 3 Encounters:  07/03/17 126 lb (57.2 kg)  04/10/17 125 lb (56.7 kg)  03/21/17  124 lb 12.8 oz (56.6 kg)   Constitutional: thin, in NAD Eyes: PERRLA, EOMI, no exophthalmos ENT: moist mucous membranes, no thyromegaly, no cervical lymphadenopathy Cardiovascular: RRR (no tachycardia at the end of the visit), No MRG Respiratory: CTA B Gastrointestinal: abdomen soft, NT, ND, BS+ Musculoskeletal: no deformities, strength intact in all 4 Skin: moist, warm, no rashes Neurological: no tremor with outstretched hands, DTR normal in all 4  ASSESSMENT: 1. DM2, non-insulin-dependent, uncontrolled, with complications - MAU  2. H/o Hyperthyroidism - 20 years ago - was on meds, cannot remember name  PLAN:  1. Patient with controlled diabetes, on oral antidiabetic regimen, with good control despite only taking 1 metformin tablet a day instead of 2. As sugars are at goal or very close to goal, no need to change her regimen at this point. We can increase the metformin back to 2 tablets a day at next visit, if needed. - We reviewed together her latest HbA1c from 2.5 months ago, which was 7.0%. It is not yet time to check another one and she prefers to wait until they are next visit for a repeat. - I suggested to:  Patient Instructions  Please continue: - Glipizide XL 5 mg in am - Glipizide XL 1/2 of a 5 mg tablet before dinner - Metformin ER 750 mg 1x a day  Please return in 3 months with your sugar log.   Please stop at the lab.  - continue checking sugars at different times of the day - check 1x a day, rotating checks - advised for yearly eye exams >> she is UTD - refuses flu shot - Return to clinic in 3 mo with sugar log   2. H/o hyperthyroidism - Appears euthyroid. She had mild tachycardia at the beginning of the appointment, but by the time I listened to her chest, she was in regular rate and rhythm. - Reviewed her latest TSH level, which was normal a year ago. We will repeat a TFT sets today.  Office Visit on 07/03/2017  Component Date Value Ref Range Status  . TSH  07/03/2017 1.13  0.35 - 4.50 uIU/mL Final  . Free T4 07/03/2017 0.93  0.60 - 1.60 ng/dL Final   Comment: Specimens from patients who are undergoing biotin therapy and /or ingesting biotin supplements may contain high levels of biotin.  The higher biotin concentration in these specimens interferes with this Free T4 assay.  Specimens that contain high levels  of biotin may cause false high results for this Free T4 assay.  Please interpret results in light of the total clinical presentation of the patient.    . T3, Free 07/03/2017 3.2  2.3 - 4.2 pg/mL Final   TFTs remain normal. Philemon Kingdom, MD PhD [facility]

## 2017-07-03 NOTE — Patient Instructions (Signed)
Please continue: - Glipizide XL 5 mg in am - Glipizide XL 1/2 of a 5 mg tablet before dinner - Metformin ER 750 mg 2x a day  Please return in 3 months with your sugar log.   Please stop at the lab.

## 2017-07-22 ENCOUNTER — Ambulatory Visit: Payer: Medicare HMO | Admitting: Sports Medicine

## 2017-08-02 ENCOUNTER — Other Ambulatory Visit: Payer: Self-pay | Admitting: Internal Medicine

## 2017-08-02 DIAGNOSIS — E785 Hyperlipidemia, unspecified: Secondary | ICD-10-CM

## 2017-08-20 DIAGNOSIS — R69 Illness, unspecified: Secondary | ICD-10-CM | POA: Diagnosis not present

## 2017-09-03 ENCOUNTER — Other Ambulatory Visit: Payer: Self-pay

## 2017-09-03 MED ORDER — IRBESARTAN 150 MG PO TABS: 150.0000 mg | ORAL_TABLET | Freq: Every day | ORAL | 0 refills | Status: DC

## 2017-09-09 ENCOUNTER — Encounter: Payer: Self-pay | Admitting: Sports Medicine

## 2017-09-09 ENCOUNTER — Ambulatory Visit: Payer: Medicare HMO | Admitting: Sports Medicine

## 2017-09-09 DIAGNOSIS — E119 Type 2 diabetes mellitus without complications: Secondary | ICD-10-CM | POA: Diagnosis not present

## 2017-09-09 DIAGNOSIS — M79671 Pain in right foot: Secondary | ICD-10-CM

## 2017-09-09 DIAGNOSIS — B351 Tinea unguium: Secondary | ICD-10-CM | POA: Diagnosis not present

## 2017-09-09 DIAGNOSIS — M79672 Pain in left foot: Secondary | ICD-10-CM | POA: Diagnosis not present

## 2017-09-09 NOTE — Progress Notes (Signed)
Patient ID: Brandi Mccormick, female   DOB: 1941/04/04, 77 y.o.   MRN: [id number]   Subjective: Brandi Mccormick is a 77 y.o. female patient with history of type 2 diabetes who presents to office today complaining of thick, long, painful nails  while ambulating in shoes; unable to trim. Patient states that the glucose reading this morning was "good". Patient does not know A1c. Patient denies any new changes in medication or new problems.   Patient Active Problem List   Diagnosis Date Noted  . 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 Pneumonia   . Essential hypertension 01/14/2017  . Hyperlipidemia LDL goal <70 11/15/2016  . 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 Protein-calorie malnutrition, severe ([place]) 06/25/2014  . Protein-calorie malnutrition ([place]) 06/24/2014  . Hypokalemia 06/24/2014  . CAP (community acquired pneumonia) 06/24/2014  . H/O hyperthyroidism 12/21/2013  . Diabetes mellitus type 2, noninsulin dependent ([place]) 11/16/2013   Current Outpatient Medications on File Prior to Visit  Medication Sig Dispense Refill  . amLODipine (NORVASC) 5 MG tablet TAKE ONE TABLET BY MOUTH ONCE DAILY 90 tablet 3  . atorvastatin (LIPITOR) 40 MG tablet Take 2 tablets (80 mg total) by mouth daily. 180 tablet 1  . atorvastatin (LIPITOR) 40 MG tablet TAKE 1 TABLET BY MOUTH ONCE DAILY 90 tablet 1  . erythromycin ophthalmic ointment erythromycin 5 mg/gram (0.5 %) eye ointment  APPLY 1 CM RIBBON INTO THE LOWER CONJUNCTIVAL SAC(S) IN THE AFFECTED EYE(S) BY OPHTHALMIC ROUTE 4 TIMES A DAY FOR 5 DAYS.    Marland Kitchen ezetimibe (ZETIA) 10 MG tablet Take 1 tablet (10 mg total) by mouth daily. 30 tablet 2  . GLIPIZIDE XL 5 MG 24 hr tablet TAKE ONE TABLET BY MOUTH BEFORE BREAKFAST AND ONE-HALF BEFORE SUPPER 135 tablet 2  . irbesartan (AVAPRO) 150 MG tablet Take 1 tablet (150 mg total) by mouth daily. 90 tablet 0  . metFORMIN (GLUCOPHAGE-XR) 750 MG 24 hr tablet TAKE 1 TABLET BY MOUTH ONCE DAILY 60 tablet 2  . moxifloxacin  (VIGAMOX) 0.5 % ophthalmic solution Place 1 drop into the left eye 3 (three) times daily. 3 mL 0  . NONFORMULARY OR COMPOUNDED [place] compound:  Onychomycosis Nail Lacquer - Fluconazole 2%, Terbinafine 1%, DMSO, dispense 120 grams, appt to affected area once daily.  +2refills. 120 each 11  . ONETOUCH DELICA LANCETS 78G MISC USE   TO CHECK GLUCOSE TWICE DAILY 100 each 7  . ONETOUCH VERIO test strip USE ONE STRIP TO CHECK GLUCOSE ONCE DAILY 50 each 7  . ranitidine (ZANTAC) 150 MG tablet Take 1 tablet (150 mg total) by mouth 2 (two) times daily. (Patient taking differently: Take 150 mg by mouth at bedtime. ) 60 tablet 1   Current Facility-Administered Medications on File Prior to Visit  Medication Dose Route Frequency Provider Last Rate Last Dose  . 0.9 %  sodium chloride infusion  500 mL Intravenous Continuous Armbruster, Carlota Raspberry, MD       Allergies  Allergen Reactions  . Penicillins Nausea And Vomiting   Objective: General: Patient is awake, alert, and oriented x 3 and in no acute distress.  Integument: Skin is warm, dry and supple bilateral. Nails are tender, long, thickened and  dystrophic with subungual debris, consistent with onychomycosis, 1-5 bilateral. No signs of infection. No open lesions or preulcerative lesions present bilateral. Remaining integument unremarkable.  Vasculature:  Dorsalis Pedis pulse 2/4 bilateral. Posterior Tibial pulse  1/4 bilateral.  Capillary fill time <3 sec 1-5 bilateral. Positive hair  growth to the level of the digits. Temperature gradient within normal limits. No varicosities present bilateral. No edema present bilateral.   Neurology: The patient has intact sensation measured with a 5.07/10g Semmes Weinstein Monofilament at all pedal sites bilateral . Vibratory sensation intact bilateral with tuning fork. No Babinski sign present bilateral.   Musculoskeletal: Asymptomatic mild bunion deformities noted bilateral, R>L. Muscular strength 5/5  in all lower extremity muscular groups bilateral without pain or limitation on range of motion . No tenderness with calf compression bilateral.  Assessment and Plan: Problem List Items Addressed This Visit    None    Visit Diagnoses    Dermatophytosis of nail    -  Primary   Foot pain, bilateral       Diabetes mellitus without complication ([place])         -Examined patient. -Discussed and educated patient on diabetic foot care, especially with regards to the vascular, neurological and musculoskeletal systems.  -Stressed the importance of good glycemic control and the detriment of not controlling glucose levels in relation to the foot. -Mechanically debrided all nails 1-5 bilateral using sterile nail nipper and filed with dremel without incident  -Cont with nail lacquer for onychomycosis ([facility]) and recommended cont vinegar soaks as previously recommended; Patient to call [facility] for a refill -Answered all patient questions -Patient to return in 2.5 months for at risk foot care -Patient advised to call the office if any problems or questions arise in the meantime.  [name], DPM

## 2017-09-22 ENCOUNTER — Other Ambulatory Visit: Payer: Self-pay | Admitting: Internal Medicine

## 2017-09-29 ENCOUNTER — Other Ambulatory Visit: Payer: Self-pay

## 2017-09-29 ENCOUNTER — Telehealth: Payer: Self-pay | Admitting: Family Medicine

## 2017-09-29 MED ORDER — AMLODIPINE BESYLATE 10 MG PO TABS: 10.0000 mg | ORAL_TABLET | Freq: Every day | ORAL | 3 refills | Status: DC

## 2017-09-29 NOTE — Telephone Encounter (Signed)
Rx changed as advised Pt states she will come in for BP follow up as advised.

## 2017-09-29 NOTE — Telephone Encounter (Signed)
Stop [facility] norvasc to 10 mg #30  1 po qd , 2 refill  Keep f/u appointment

## 2017-09-29 NOTE — Telephone Encounter (Signed)
CRM forwarded to [facility]. Pt seems fairly upset about Rx recall and wants Rx switched or advised on what to do ASAP. Pt states that she's still taking Rx but no longer wants to. Pt is scheduled to come in in 2 weeks for BP follow up, same day as her husband to see nurse for BP follow up pending a change in therapy is made.

## 2017-09-29 NOTE — Telephone Encounter (Signed)
Pt called and Rx change was made. Pt will come in as advised. Will D/c irbesartan

## 2017-09-29 NOTE — Telephone Encounter (Signed)
Copied from [place]. Topic: Quick Communication - See Telephone Encounter >> Sep 29, 2017  8:37 AM Ether Griffins B wrote: CRM for notification. See Telephone encounter for:  Pt calling about recall on irbesartan.  09/29/17.

## 2017-10-03 ENCOUNTER — Ambulatory Visit: Payer: Medicare HMO | Admitting: Internal Medicine

## 2017-10-03 ENCOUNTER — Encounter: Payer: Self-pay | Admitting: Internal Medicine

## 2017-10-03 VITALS — BP 126/80 | HR 90 | Ht 64.0 in | Wt 128.2 lb

## 2017-10-03 DIAGNOSIS — Z8639 Personal history of other endocrine, nutritional and metabolic disease: Secondary | ICD-10-CM | POA: Diagnosis not present

## 2017-10-03 DIAGNOSIS — E119 Type 2 diabetes mellitus without complications: Secondary | ICD-10-CM | POA: Diagnosis not present

## 2017-10-03 LAB — POCT GLYCOSYLATED HEMOGLOBIN (HGB A1C): HEMOGLOBIN A1C: 7.4

## 2017-10-03 NOTE — Progress Notes (Signed)
Patient ID: Brandi Mccormick, female   DOB: June 22, 1941, 77 y.o.   MRN: [id number]  HPI: Brandi Mccormick is a 77 y.o.-year-old female, returning for f/u for DM2, dx 2013, non-insulin-dependent, uncontrolled, with complications (microalbuminuria) and h/o hyperthyroidism. Last visit 3 months ago.  DM2: Hemoglobin A1c levels: Lab Results  Component Value Date   HGBA1C 7.0 (H) 04/18/2017   HGBA1C 6.9 02/14/2017   HGBA1C 7.3 11/15/2016   HGBA1C 6.6 06/28/2016   HGBA1C 7.1 03/28/2016   HGBA1C 7.5 12/29/2015   HGBA1C 6.9 08/30/2015   HGBA1C 6.7 05/02/2015   HGBA1C 6.6 (H) 12/27/2014   HGBA1C 6.7 (H) 09/26/2014   HGBA1C 7.2 (H) 05/23/2014   HGBA1C 8.1 (H) 02/17/2014   HGBA1C 11.9 (H) 11/16/2013   Pt is on a regimen of: - Metformin ER 750 mg once a day - Glipizide XL 5 mg in am - Glipizide XL 2.5 mg tablet before dinner. We tried Tradjenta 5 mg daily in am - too expensive. She was getting diarrhea from Metformin >> now Metformin XR 750 mg in am >> tolerates this well.  Pt now checks her sugars -once a day: - am: 127-153 >> 86-140 >> 96-132, 141 >> 103, 130-150 - 2h after b'fast: 150, 151 >> 109-147 >> 140 >> 134, 153 - before lunch:  129-152 >> 130 >> 98-134 >> n/c - 2h after lunch: 142-156 >> 136 >> 93-145 >> n/c - before dinner: 79-144 >> 104-150 >> 74, 110-140 >> 75, 114-154 - 2h after dinner: 138-158 >>143, 150 >> n/c >> 90, 102 - bedtime:109-150 >> 103, 128 >> 140, 144 >> 105-144 - nighttime: n/c >> 161 >> n/c Lowest sugar: 74; unclear at what level she has hypoglycemia awareness. Highest sugar:  145.  Meter: OneTouch Verio.  Pt's meals are: - Breakfast: egg, coffee, juice; sometime - Lunch: No Lunch - Dinner: meat + vegetables - Snacks: 1-2 Cut down on Pepsi and on sweets.  -+ CKD, last BUN/creatinine:  Lab Results  Component Value Date   BUN 17 04/18/2017   CREATININE 0.80 04/18/2017   + Microalbuminuria: Lab Results  Component Value Date   MICRALBCREAT 225.8  (H) 11/15/2016   MICRALBCREAT 48.8 (H) 06/28/2016   MICRALBCREAT 9.9 02/21/2014   MICRALBCREAT 8.8 11/16/2013  Prev. on irbesartan, stopped 2/2 recall. Now on Amlodipine.  -+ HL; last set of lipids: Lab Results  Component Value Date   CHOL 181 04/18/2017   HDL 52.70 04/18/2017   LDLCALC 115 (H) 04/18/2017   TRIG 66.0 04/18/2017   CHOLHDL 3 04/18/2017  Prev. on Simvastatin >> off in 06/2016 b/c fatigue.  On pravastatin now -80 mg daily since 12/2016.  Also on Zetia.  - Denies numbness and tingling in her feet.  - Last eye exam 10/2016: No DR  She has a h/o hyperthyroidism, previously treated with medication, approximately 20 years ago.  No other history available.  Pt denies: - feeling nodules in neck - hoarseness - dysphagia - choking - SOB with lying down  Pt denies: - weight loss - heat intolerance - tremors - palpitations - anxiety - hyperdefecation - hair loss  Recent labs reviewed - normal since at least 2015: Lab Results  Component Value Date   TSH 1.13 07/03/2017   TSH 1.12 06/28/2016   TSH 1.29 06/23/2015   TSH 1.44 10/18/2014   TSH 0.93 08/15/2014   TSH 0.44 02/17/2014   TSH 0.28 (L) 12/20/2013   FREET4 0.93 07/03/2017   FREET4 0.96 06/28/2016   FREET4 0.91  08/15/2014   FREET4 1.00 02/17/2014   FREET4 0.97 12/20/2013   T3FREE 3.2 07/03/2017   T3FREE 3.1 06/28/2016   T3FREE 3.2 08/15/2014   T3FREE 2.6 02/17/2014   T3FREE 2.9 12/20/2013   ROS: Constitutional: + See HPI Eyes: no blurry vision, no xerophthalmia ENT: no sore throat, + see HPI Cardiovascular: no CP/no SOB/no palpitations/no leg swelling Respiratory: no cough/no SOB/no wheezing Gastrointestinal: no N/no V/no D/no C/no acid reflux Musculoskeletal: no muscle aches/no joint aches Skin: no rashes, no hair loss Neurological: no tremors/no numbness/no tingling/no dizziness  I reviewed pt's medications, allergies, PMH, social hx, family hx, and changes were documented in the history of  present illness. Otherwise, unchanged from my initial visit note.  PE: BP 126/80   Pulse 90   Ht 5\' 4"  (1.626 m)   Wt 128 lb 3.2 oz (58.2 kg)   SpO2 98%   BMI 22.01 kg/m  Body mass index is 22.01 kg/m. Wt Readings from Last 3 Encounters:  10/03/17 128 lb 3.2 oz (58.2 kg)  07/03/17 126 lb (57.2 kg)  04/10/17 125 lb (56.7 kg)   Constitutional: normal weight, in NAD Eyes: PERRLA, EOMI, no exophthalmos ENT: moist mucous membranes, no thyromegaly, no cervical lymphadenopathy Cardiovascular: RRR, No MRG Respiratory: CTA B Gastrointestinal: abdomen soft, NT, ND, BS+ Musculoskeletal: no deformities, strength intact in all 4 Skin: moist, warm, no rashes Neurological: no tremor with outstretched hands, DTR normal in all 4  ASSESSMENT: 1. DM2, non-insulin-dependent, uncontrolled, with complications - MAU  2. H/o Hyperthyroidism - 20 years ago - was on meds, cannot remember name  PLAN:  1. Patient with history of uncontrolled diabetes, with better control in last 3-4 years, only on oral antidiabetic regimen.  She takes a low-dose metformin ER once a day in the morning.  Her sugars in the morning are slightly higher than target, so I advised her to move the metformin dose with dinner.  She takes glipizide before breakfast and dinner and will continue this since she has no lows.  I advised her to let me know if she develops any lows, in that case we need to reduce or even stop glipizide.  She had problems affording other tier medications in the past. - latest HbA1c: 7.0% 04/2017- today, HbA1c is 7.4% (higher) - I suggested to:  Patient Instructions  Please continue: - Glipizide XL 5 mg in am - Glipizide XL 1/2 of a 5 mg tablet before dinner  Please move: - Metformin ER 750 mg to dinnertime  Please return in 4 months with your sugar log.   - continue checking sugars at different times of the day - check 1x a day, rotating checks - advised for yearly eye exams >> she is UTD - Return  to clinic in 4 mo with sugar log   2. H/o hyperthyroidism - appears euthyroid - no tachycardia on auscultation - recent TFTs were reviewed >> normal in 07/2017  Philemon Kingdom, MD PhD [facility] Endocr[facility]

## 2017-10-03 NOTE — Patient Instructions (Addendum)
Please continue: - Glipizide XL 5 mg in am - Glipizide XL 1/2 of a 5 mg tablet before dinner  Please move: - Metformin ER 750 mg to dinnertime  Please return in 4 months with your sugar log.

## 2017-10-03 NOTE — Addendum Note (Signed)
Addended by: Drucilla Schmidt on: 10/03/2017 04:23 PM   Modules accepted: Orders

## 2017-10-14 ENCOUNTER — Ambulatory Visit: Payer: Medicare HMO

## 2017-10-14 VITALS — BP 169/85 | HR 78

## 2017-10-14 DIAGNOSIS — R69 Illness, unspecified: Secondary | ICD-10-CM | POA: Diagnosis not present

## 2017-10-14 DIAGNOSIS — I10 Essential (primary) hypertension: Secondary | ICD-10-CM

## 2017-10-14 MED ORDER — METOPROLOL SUCCINATE ER 25 MG PO TB24: 25.0000 mg | ORAL_TABLET | Freq: Every day | ORAL | 3 refills | Status: DC

## 2017-10-14 NOTE — Addendum Note (Signed)
Addended by: Roma Kayser on: 10/14/2017 01:49 PM   Modules accepted: Orders

## 2017-10-14 NOTE — Progress Notes (Addendum)
Pt comes in this morning for a BP check. Pt's BP was 173/92 upon coming in. She stated that traffic was bad and there was a lot going on this morning that may have "pushed it up" due to the acute stress. Pt states she's taking her Norvasc 10mg  tablets daily as advised.Pt states her last dose was about an hour before the appointment. After allowing Pt to rest for about 85minutes her BP went down to 169/85. Advised Pt that I would consult with her PCP upon arrival and call her with any directives or changes provider may advise. Pt states that she understands and is willing to comply with future directives.     Dr.Lowne sent in the following instructions:   toprol 25 mg  #30 1 po qd     bp check 2-3 weeks  Med to be added on   Pt called and notified of update and Rx sent in to pharmacy of Pt's choice ([facility] on [address]). Pt stated she would comply with new regimen.

## 2017-10-14 NOTE — Progress Notes (Signed)
toprol 25 mg  #30 1 po qd     bp check 2-3 weeks  Med to be added on

## 2017-11-18 ENCOUNTER — Ambulatory Visit: Payer: Medicare HMO | Admitting: Sports Medicine

## 2017-11-18 ENCOUNTER — Encounter: Payer: Self-pay | Admitting: Sports Medicine

## 2017-11-18 DIAGNOSIS — M79672 Pain in left foot: Secondary | ICD-10-CM

## 2017-11-18 DIAGNOSIS — B351 Tinea unguium: Secondary | ICD-10-CM | POA: Diagnosis not present

## 2017-11-18 DIAGNOSIS — M79671 Pain in right foot: Secondary | ICD-10-CM | POA: Diagnosis not present

## 2017-11-18 DIAGNOSIS — E119 Type 2 diabetes mellitus without complications: Secondary | ICD-10-CM | POA: Diagnosis not present

## 2017-11-18 NOTE — Progress Notes (Signed)
Patient ID: Brandi Mccormick, female   DOB: 14-Oct-1940, 77 y.o.   MRN: [id number]   Subjective: Brandi Mccormick is a 77 y.o. female patient with history of type 2 diabetes who presents to office today complaining of thick, long, painful nails  while ambulating in shoes; unable to trim. Patient states that the glucose reading this morning was "good", no changes. Patient admits to a little swelling in ankles.   Patient Active Problem List   Diagnosis Date Noted  . 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 Pneumonia   . Essential hypertension 01/14/2017  . Hyperlipidemia LDL goal <70 11/15/2016  . 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 Protein-calorie malnutrition, severe ([place]) 06/25/2014  . Protein-calorie malnutrition ([place]) 06/24/2014  . Hypokalemia 06/24/2014  . CAP (community acquired pneumonia) 06/24/2014  . H/O hyperthyroidism 12/21/2013  . Diabetes mellitus type 2, noninsulin dependent ([place]) 11/16/2013   Current Outpatient Medications on File Prior to Visit  Medication Sig Dispense Refill  . amLODipine (NORVASC) 10 MG tablet Take 1 tablet (10 mg total) by mouth daily. 90 tablet 3  . erythromycin ophthalmic ointment erythromycin 5 mg/gram (0.5 %) eye ointment  APPLY 1 CM RIBBON INTO THE LOWER CONJUNCTIVAL SAC(S) IN THE AFFECTED EYE(S) BY OPHTHALMIC ROUTE 4 TIMES A DAY FOR 5 DAYS.    Marland Kitchen ezetimibe (ZETIA) 10 MG tablet Take 1 tablet (10 mg total) by mouth daily. 30 tablet 2  . GLIPIZIDE XL 5 MG 24 hr tablet TAKE ONE TABLET BY MOUTH BEFORE BREAKFAST AND ONE-HALF BEFORE SUPPER 135 tablet 2  . metFORMIN (GLUCOPHAGE-XR) 750 MG 24 hr tablet TAKE 1 TABLET BY MOUTH ONCE DAILY 60 tablet 0  . metoprolol succinate (TOPROL-XL) 25 MG 24 hr tablet Take 1 tablet (25 mg total) by mouth daily. 90 tablet 3  . moxifloxacin (VIGAMOX) 0.5 % ophthalmic solution Place 1 drop into the left eye 3 (three) times daily. 3 mL 0  . NONFORMULARY OR COMPOUNDED [place] compound:  Onychomycosis Nail Lacquer -  Fluconazole 2%, Terbinafine 1%, DMSO, dispense 120 grams, appt to affected area once daily.  +2refills. 120 each 11  . ONETOUCH DELICA LANCETS 70W MISC USE   TO CHECK GLUCOSE TWICE DAILY 100 each 7  . ONETOUCH VERIO test strip USE ONE STRIP TO CHECK GLUCOSE ONCE DAILY 50 each 7  . ranitidine (ZANTAC) 150 MG tablet Take 1 tablet (150 mg total) by mouth 2 (two) times daily. (Patient taking differently: Take 150 mg by mouth at bedtime. ) 60 tablet 1   Current Facility-Administered Medications on File Prior to Visit  Medication Dose Route Frequency Provider Last Rate Last Dose  . 0.9 %  sodium chloride infusion  500 mL Intravenous Continuous Armbruster, Carlota Raspberry, MD       Allergies  Allergen Reactions  . Penicillins Nausea And Vomiting   Objective: General: Patient is awake, alert, and oriented x 3 and in no acute distress.  Integument: Skin is warm, dry and supple bilateral. Nails are tender, long, thickened and  dystrophic with subungual debris, consistent with onychomycosis, 1-5 bilateral. No signs of infection. No open lesions or preulcerative lesions present bilateral. Remaining integument unremarkable.  Vasculature:  Dorsalis Pedis pulse 2/4 bilateral. Posterior Tibial pulse  1/4 bilateral.  Capillary fill time <3 sec 1-5 bilateral. Positive hair growth to the level of the digits. Temperature gradient within normal limits. No varicosities present bilateral. Trace edema present bilateral.   Neurology: The patient has intact sensation measured with a 5.07/10g Semmes Weinstein Monofilament at  all pedal sites bilateral . Vibratory sensation intact bilateral with tuning fork. No Babinski sign present bilateral.   Musculoskeletal: Asymptomatic mild bunion deformities noted bilateral, R>L. Muscular strength 5/5 in all lower extremity muscular groups bilateral without pain or limitation on range of motion . No tenderness with calf compression bilateral.  Assessment and Plan: Problem List Items  Addressed This Visit    None    Visit Diagnoses    Dermatophytosis of nail    -  Primary   Foot pain, bilateral       Diabetes mellitus without complication ([name])         -Examined patient. -Discussed and educated patient on diabetic foot care, especially with regards to the vascular, neurological and musculoskeletal systems.  -Stressed the importance of good glycemic control and the detriment of not controlling glucose levels in relation to the foot. -Mechanically debrided all nails 1-5 bilateral using sterile nail nipper and filed with dremel without incident  -Recommend elevation to assist with edema control  -Answered all patient questions -Patient to return in 2.5 months for at risk foot care -Patient advised to call the office if any problems or questions arise in the meantime.  [name], DPM

## 2017-11-21 DIAGNOSIS — R69 Illness, unspecified: Secondary | ICD-10-CM | POA: Diagnosis not present

## 2017-11-27 ENCOUNTER — Other Ambulatory Visit: Payer: Self-pay | Admitting: Internal Medicine

## 2017-12-06 DIAGNOSIS — R69 Illness, unspecified: Secondary | ICD-10-CM | POA: Diagnosis not present

## 2017-12-09 DIAGNOSIS — R69 Illness, unspecified: Secondary | ICD-10-CM | POA: Diagnosis not present

## 2017-12-13 DIAGNOSIS — H524 Presbyopia: Secondary | ICD-10-CM | POA: Diagnosis not present

## 2017-12-13 LAB — HM DIABETES EYE EXAM

## 2017-12-25 ENCOUNTER — Other Ambulatory Visit: Payer: Self-pay | Admitting: Internal Medicine

## 2017-12-31 ENCOUNTER — Other Ambulatory Visit: Payer: Self-pay

## 2017-12-31 DIAGNOSIS — R69 Illness, unspecified: Secondary | ICD-10-CM | POA: Diagnosis not present

## 2017-12-31 MED ORDER — METFORMIN HCL ER 750 MG PO TB24: 750.0000 mg | ORAL_TABLET | Freq: Every day | ORAL | 0 refills | Status: DC

## 2018-01-01 ENCOUNTER — Other Ambulatory Visit: Payer: Self-pay

## 2018-01-01 MED ORDER — METFORMIN HCL ER 750 MG PO TB24: 750.0000 mg | ORAL_TABLET | Freq: Every day | ORAL | 0 refills | Status: DC

## 2018-01-27 ENCOUNTER — Ambulatory Visit: Payer: Medicare HMO | Admitting: Sports Medicine

## 2018-01-27 ENCOUNTER — Encounter: Payer: Self-pay | Admitting: Sports Medicine

## 2018-01-27 DIAGNOSIS — E119 Type 2 diabetes mellitus without complications: Secondary | ICD-10-CM | POA: Diagnosis not present

## 2018-01-27 DIAGNOSIS — M79671 Pain in right foot: Secondary | ICD-10-CM | POA: Diagnosis not present

## 2018-01-27 DIAGNOSIS — M79672 Pain in left foot: Secondary | ICD-10-CM | POA: Diagnosis not present

## 2018-01-27 DIAGNOSIS — B351 Tinea unguium: Secondary | ICD-10-CM

## 2018-01-27 NOTE — Progress Notes (Signed)
Patient ID: Brandi Mccormick, female   DOB: [birth year], 77 y.o.   MRN: [id number]   Subjective: Brandi Mccormick is a 77 y.o. female patient with history of type 2 diabetes who presents to office today complaining of thick, long, painful nails  while ambulating in shoes; unable to trim. Patient states that the glucose reading this morning was "good", around 140, no changes to health or medications since last visit.    Patient Active Problem List   Diagnosis Date Noted  . 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 Pneumonia   . Essential hypertension 01/14/2017  . Hyperlipidemia LDL goal <70 11/15/2016  . 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 Protein-calorie malnutrition, severe ([place]) 06/25/2014  . Protein-calorie malnutrition ([place]) 06/24/2014  . Hypokalemia 06/24/2014  . CAP (community acquired pneumonia) 06/24/2014  . H/O hyperthyroidism 12/21/2013  . Diabetes mellitus type 2, noninsulin dependent ([place]) 11/16/2013   Current Outpatient Medications on File Prior to Visit  Medication Sig Dispense Refill  . amLODipine (NORVASC) 10 MG tablet Take 1 tablet (10 mg total) by mouth daily. 90 tablet 3  . erythromycin ophthalmic ointment erythromycin 5 mg/gram (0.5 %) eye ointment  APPLY 1 CM RIBBON INTO THE LOWER CONJUNCTIVAL SAC(S) IN THE AFFECTED EYE(S) BY OPHTHALMIC ROUTE 4 TIMES A DAY FOR 5 DAYS.    Marland Kitchen ezetimibe (ZETIA) 10 MG tablet Take 1 tablet (10 mg total) by mouth daily. 30 tablet 2  . GLIPIZIDE XL 5 MG 24 hr tablet TAKE ONE TABLET BY MOUTH BEFORE BREAKFAST AND ONE-HALF BEFORE SUPPER 135 tablet 2  . metFORMIN (GLUCOPHAGE-XR) 750 MG 24 hr tablet Take 1 tablet (750 mg total) by mouth daily. 90 tablet 0  . metoprolol succinate (TOPROL-XL) 25 MG 24 hr tablet Take 1 tablet (25 mg total) by mouth daily. 90 tablet 3  . moxifloxacin (VIGAMOX) 0.5 % ophthalmic solution Place 1 drop into the left eye 3 (three) times daily. 3 mL 0  . NONFORMULARY OR COMPOUNDED [place] compound:   Onychomycosis Nail Lacquer - Fluconazole 2%, Terbinafine 1%, DMSO, dispense 120 grams, appt to affected area once daily.  +2refills. 120 each 11  . ONETOUCH DELICA LANCETS 42A MISC USE   TO CHECK GLUCOSE TWICE DAILY 100 each 7  . ONETOUCH VERIO test strip USE 1 STRIP TO CHECK GLUCOSE ONCE DAILY 50 each 7  . ranitidine (ZANTAC) 150 MG tablet Take 1 tablet (150 mg total) by mouth 2 (two) times daily. (Patient taking differently: Take 150 mg by mouth at bedtime. ) 60 tablet 1   Current Facility-Administered Medications on File Prior to Visit  Medication Dose Route Frequency Provider Last Rate Last Dose  . 0.9 %  sodium chloride infusion  500 mL Intravenous Continuous Armbruster, Carlota Raspberry, MD       Allergies  Allergen Reactions  . Penicillins Nausea And Vomiting   Objective: General: Patient is awake, alert, and oriented x 3 and in no acute distress.  Integument: Skin is warm, dry and supple bilateral. Nails are tender, long, thickened and  dystrophic with subungual debris, consistent with onychomycosis, 1-5 bilateral. No signs of infection. No open lesions or preulcerative lesions present bilateral. Remaining integument unremarkable.  Vasculature:  Dorsalis Pedis pulse 2/4 bilateral. Posterior Tibial pulse  1/4 bilateral.  Capillary fill time <3 sec 1-5 bilateral. Positive hair growth to the level of the digits. Temperature gradient within normal limits. No varicosities present bilateral. Trace edema present bilateral.   Neurology: The patient has intact sensation measured with a 5.07/10g  Semmes Weinstein Monofilament at all pedal sites bilateral . Vibratory sensation intact bilateral with tuning fork. No Babinski sign present bilateral.   Musculoskeletal: Asymptomatic mild bunion deformities noted bilateral, R>L. Muscular strength 5/5 in all lower extremity muscular groups bilateral without pain or limitation on range of motion . No tenderness with calf compression bilateral.  Assessment and  Plan: Problem List Items Addressed This Visit    None    Visit Diagnoses    Dermatophytosis of nail    -  Primary   Foot pain, bilateral       Diabetes mellitus without complication ([name])         -Examined patient. -Discussed and educated patient on diabetic foot care, especially with regards to the vascular, neurological and musculoskeletal systems.  -Stressed the importance of good glycemic control and the detriment of not controlling glucose levels in relation to the foot. -Mechanically debrided all nails 1-5 bilateral using sterile nail nipper and filed with dremel without incident  -Patient to return in 2.5 to 3 months for at risk foot care -Patient advised to call the office if any problems or questions arise in the meantime.  [name], DPM

## 2018-02-02 ENCOUNTER — Encounter: Payer: Self-pay | Admitting: Internal Medicine

## 2018-02-02 ENCOUNTER — Ambulatory Visit (INDEPENDENT_AMBULATORY_CARE_PROVIDER_SITE_OTHER): Payer: Medicare HMO | Admitting: Internal Medicine

## 2018-02-02 VITALS — BP 130/90 | HR 85 | Ht 64.0 in | Wt 127.4 lb

## 2018-02-02 DIAGNOSIS — Z8639 Personal history of other endocrine, nutritional and metabolic disease: Secondary | ICD-10-CM | POA: Diagnosis not present

## 2018-02-02 DIAGNOSIS — E785 Hyperlipidemia, unspecified: Secondary | ICD-10-CM

## 2018-02-02 DIAGNOSIS — E119 Type 2 diabetes mellitus without complications: Secondary | ICD-10-CM | POA: Diagnosis not present

## 2018-02-02 LAB — POCT GLYCOSYLATED HEMOGLOBIN (HGB A1C): Hemoglobin A1C: 7.6 % — AB (ref 4.0–5.6)

## 2018-02-02 MED ORDER — METFORMIN HCL ER 750 MG PO TB24: 750.0000 mg | ORAL_TABLET | Freq: Two times a day (BID) | ORAL | 3 refills | Status: DC

## 2018-02-02 NOTE — Progress Notes (Signed)
Patient ID: Brandi Mccormick, female   DOB: [birth year], 77 y.o.   MRN: [id number]  HPI: MIKI Mccormick is a 77 y.o.-year-old female, returning for f/u for DM2, dx 2013, non-insulin-dependent, uncontrolled, with complications (microalbuminuria) and h/o hyperthyroidism. Last visit 4 months ago  DM2: Hemoglobin A1c levels: Lab Results  Component Value Date   HGBA1C 7.4 10/03/2017   HGBA1C 7.0 (H) 04/18/2017   HGBA1C 6.9 02/14/2017   HGBA1C 7.3 11/15/2016   HGBA1C 6.6 06/28/2016   HGBA1C 7.1 03/28/2016   HGBA1C 7.5 12/29/2015   HGBA1C 6.9 08/30/2015   HGBA1C 6.7 05/02/2015   HGBA1C 6.6 (H) 12/27/2014   HGBA1C 6.7 (H) 09/26/2014   HGBA1C 7.2 (H) 05/23/2014   HGBA1C 8.1 (H) 02/17/2014   HGBA1C 11.9 (H) 11/16/2013   Pt is on a regimen of: - Glipizide XL 5 mg in am - Glipizide XL 2.5 mg before dinner - Metformin ER 750 mg in a.m. (she did not move it at dinnertime as advised at last visit, as she forgot).  We tried Tradjenta 5 mg daily in am - too expensive. She was getting diarrhea from Metformin >> now Metformin XR 750 mg daily >> tolerates this well.  Pt checks her sugars -1X a day: - am: 96-132, 141 >> 103, 130-150 >> 109-152 - 2h after b'fast: 109-147 >> 140 >> 134, 153 >>122 - before lunch:   130 >> 98-134 >> n/c >> 114-145 - 2h after lunch:  136 >> 93-145 >> n/c - before dinner:  74, 110-140 >> 75, 114-154 >> 85, 109-150 - 2h after dinner:143, 150 >> n/c >> 90, 102 >> 145-155 - bedtime: 140, 144 >> 105-144 >> 103-142 >> 109-141 - nighttime: n/c >> 161 >> n/c Lowest sugar: 74 >> 85; It is unclear at which level she has hypoglycemia awareness Highest sugar:  145 >> 155  Meter: OneTouch Verio.  Pt's meals are: - Breakfast: egg, coffee, juice; sometime - Lunch: No Lunch - Dinner: meat + vegetables >> late dinner during the weekend as her son is cooking and brings her food - Snacks: 1-2 Cut down on Pepsi and sweets.  -+ CKD, last BUN/creatinine:  Lab Results  Component  Value Date   BUN 17 04/18/2017   CREATININE 0.80 04/18/2017   She has a history of microalbuminuria: Lab Results  Component Value Date   MICRALBCREAT 225.8 (H) 11/15/2016   MICRALBCREAT 48.8 (H) 06/28/2016   MICRALBCREAT 9.9 02/21/2014   MICRALBCREAT 8.8 11/16/2013  Previously on irbesartan, stopped due to recall.  Now on amlodipine.  -+ HL; last set of lipids: Lab Results  Component Value Date   CHOL 181 04/18/2017   HDL 52.70 04/18/2017   LDLCALC 115 (H) 04/18/2017   TRIG 66.0 04/18/2017   CHOLHDL 3 04/18/2017  Prev. on Simvastatin >> off in 06/2016 b/c fatigue.  On pravastatin 80 mg daily since 12/2016.  Also on Zetia.  -No numbness and tingling in her feet.  - Last eye exam 12/2017: No DR  She has a h/o hyperthyroidism, previously treated with medication, approximately 20 years ago.  No other history available.  Pt denies: - feeling nodules in neck - hoarseness - dysphagia - choking - SOB with lying down  Review TFTs-normal at least since 2015: Lab Results  Component Value Date   TSH 1.13 07/03/2017   TSH 1.12 06/28/2016   TSH 1.29 06/23/2015   TSH 1.44 10/18/2014   TSH 0.93 08/15/2014   TSH 0.44 02/17/2014   TSH 0.28 (L) 12/20/2013  FREET4 0.93 07/03/2017   FREET4 0.96 06/28/2016   FREET4 0.91 08/15/2014   FREET4 1.00 02/17/2014   FREET4 0.97 12/20/2013   T3FREE 3.2 07/03/2017   T3FREE 3.1 06/28/2016   T3FREE 3.2 08/15/2014   T3FREE 2.6 02/17/2014   T3FREE 2.9 12/20/2013   ROS: Constitutional: no weight gain/no weight loss, no fatigue, no subjective hyperthermia, no subjective hypothermia Eyes: no blurry vision, no xerophthalmia ENT: no sore throat, + see HPI Cardiovascular: no CP/no SOB/no palpitations/no leg swelling Respiratory: no cough/no SOB/no wheezing Gastrointestinal: no N/no V/no D/no C/no acid reflux Musculoskeletal: no muscle aches/no joint aches Skin: no rashes, no hair loss Neurological: no tremors/no numbness/no tingling/no  dizziness  I reviewed pt's medications, allergies, PMH, social hx, family hx, and changes were documented in the history of present illness. Otherwise, unchanged from my initial visit note.  Past Medical History:  Diagnosis Date  . Diabetes ([place])   . GERD (gastroesophageal reflux disease)   . Hyperlipidemia   . Hypertension   . Hyperthyroidism   . Pneumonia   . Shingles    Past Surgical History:  Procedure Laterality Date  . COLONOSCOPY    . DENTAL SURGERY     Social History   Socioeconomic History  . Marital status: Married    Spouse name: Not on file  . Number of children: Not on file  . Years of education: Not on file  . Highest education level: Not on file  Occupational History  . 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 Financial resource strain: Not on file  . Food insecurity:    Worry: Not on file    Inability: Not on file  . Transportation needs:    Medical: Not on file    Non-medical: Not on file  Tobacco Use  . Smoking status: Never Smoker  . Smokeless tobacco: Never Used  Substance and Sexual Activity  . Alcohol use: Yes    Alcohol/week: 0.0 oz    Comment: occasional wine cooler  . Drug use: No  . Sexual activity: Never    Partners: Male  Lifestyle  . Physical activity:    Days per week: Not on file    Minutes per session: Not on file  . Stress: Not on file  Relationships  . Social connections:    Talks on phone: Not on file    Gets together: Not on file    Attends religious service: Not on file    Active member of club or organization: Not on file    Attends meetings of clubs or organizations: Not on file    Relationship status: Not on file  . Intimate partner violence:    Fear of current or ex partner: Not on file    Emotionally abused: Not on file    Physically abused: Not on file    Forced sexual activity: Not on file  Other Topics Concern  . Not on file  Social History Narrative   Exercise-- yard work,  Walking in  [employer]   Current Outpatient Medications on File Prior to Visit  Medication Sig Dispense Refill  . amLODipine (NORVASC) 10 MG tablet Take 1 tablet (10 mg total) by mouth daily. 90 tablet 3  . erythromycin ophthalmic ointment erythromycin 5 mg/gram (0.5 %) eye ointment  APPLY 1 CM RIBBON INTO THE LOWER CONJUNCTIVAL SAC(S) IN THE AFFECTED EYE(S) BY OPHTHALMIC ROUTE 4 TIMES A DAY FOR 5 DAYS.    Marland Kitchen ezetimibe (ZETIA) 10 MG tablet Take  1 tablet (10 mg total) by mouth daily. 30 tablet 2  . GLIPIZIDE XL 5 MG 24 hr tablet TAKE ONE TABLET BY MOUTH BEFORE BREAKFAST AND ONE-HALF BEFORE SUPPER 135 tablet 2  . metFORMIN (GLUCOPHAGE-XR) 750 MG 24 hr tablet Take 1 tablet (750 mg total) by mouth daily. 90 tablet 0  . metoprolol succinate (TOPROL-XL) 25 MG 24 hr tablet Take 1 tablet (25 mg total) by mouth daily. 90 tablet 3  . moxifloxacin (VIGAMOX) 0.5 % ophthalmic solution Place 1 drop into the left eye 3 (three) times daily. 3 mL 0  . NONFORMULARY OR COMPOUNDED [place] compound:  Onychomycosis Nail Lacquer - Fluconazole 2%, Terbinafine 1%, DMSO, dispense 120 grams, appt to affected area once daily.  +2refills. 120 each 11  . ONETOUCH DELICA LANCETS 29J MISC USE   TO CHECK GLUCOSE TWICE DAILY 100 each 7  . ONETOUCH VERIO test strip USE 1 STRIP TO CHECK GLUCOSE ONCE DAILY 50 each 7  . ranitidine (ZANTAC) 150 MG tablet Take 1 tablet (150 mg total) by mouth 2 (two) times daily. (Patient taking differently: Take 150 mg by mouth at bedtime. ) 60 tablet 1   Current Facility-Administered Medications on File Prior to Visit  Medication Dose Route Frequency Provider Last Rate Last Dose  . 0.9 %  sodium chloride infusion  500 mL Intravenous Continuous Armbruster, Carlota Raspberry, MD       Allergies  Allergen Reactions  . Penicillins Nausea And Vomiting   Family History  Problem Relation Age of Onset  . Diabetes Sister   . Diabetes Brother   . Diabetes Sister   . Diabetes Mother        and on mother's side  of family  . Colon cancer Mother   . Stroke Mother   . Diabetes Father        and on father's side of family  . Heart disease Neg Hx   . Rectal cancer Neg Hx   . Stomach cancer Neg Hx     PE: BP 130/90   Pulse 85   Ht 5\' 4"  (1.626 m)   Wt 127 lb 6.4 oz (57.8 kg)   SpO2 96%   BMI 21.87 kg/m  Body mass index is 21.87 kg/m. Wt Readings from Last 3 Encounters:  02/02/18 127 lb 6.4 oz (57.8 kg)  10/03/17 128 lb 3.2 oz (58.2 kg)  07/03/17 126 lb (57.2 kg)   Constitutional: Normal weight, in NAD Eyes: PERRLA, EOMI, no exophthalmos ENT: moist mucous membranes, no thyromegaly, no cervical lymphadenopathy Cardiovascular: RRR, No MRG Respiratory: CTA B Gastrointestinal: abdomen soft, NT, ND, BS+ Musculoskeletal: no deformities, strength intact in all 4 Skin: moist, warm, no rashes Neurological: no tremor with outstretched hands, DTR normal in all 4  ASSESSMENT: 1. DM2, non-insulin-dependent, uncontrolled, with complications - MAU  2. H/o Hyperthyroidism - 20 years ago - was on meds, cannot remember name  3. HL  PLAN:  1. Patient with history of uncontrolled diabetes, with better control in the last 4 years, only on oral antidiabetic regimen.  She takes a low-dose metformin ER, once a day.  I advised her to move this to dinnertime at last visit as her morning sugars were slightly higher than target, but she forgot and still taking it in a.m.  She continues to take glipizide XL before breakfast and a lower dose before dinner (1/2 of a 5 mg tablet).  No lows.  She could not afford her other medicines in the past. -  At this visit, sugars are slightly higher, especially in the morning-with more values in the 150s.  I suspect that this is due to her late dinners or snacking.  We discussed about not snacking after 7 PM ideally and moving dinner earlier.  We will also add another metformin ER tablet with dinnerto improve a.m. Sugars. - At last visit, HbA1c was slightly higher, at 7.4% -  I suggested to:  Patient Instructions  Please continue: - Glipizide XL 5 mg in am - Glipizide XL 2.5 mg before dinner  Please increase: - Metformin ER 750 mg to 2x a day with meals  Please return in 4 months with your sugar log.   - today, HbA1c is 7.6% (worse) - continue checking sugars at different times of the day - check 1x a day, rotating checks - advised for yearly eye exams >> she is UTD - Return to clinic in 4 mo with sugar log   2. H/o hyperthyroidism - appears euthyroid - No tachycardia - Recent TFTs were reviewed: Normal in 07/2017  3. HL - Reviewed latest lipid panel from 04/2017: LDL above target, with the rest of the fractions at goal - Continues high-dose pravastatin without side effects.   Philemon Kingdom, MD PhD [facility]

## 2018-02-02 NOTE — Addendum Note (Signed)
Addended by: Drucilla Schmidt on: 02/02/2018 11:40 AM   Modules accepted: Orders

## 2018-02-02 NOTE — Patient Instructions (Addendum)
Please continue: - Glipizide XL 5 mg in am - Glipizide XL 2.5 mg before dinner  Please increase: - Metformin ER 750 mg to 2x a day with meals  Please return in 4 months with your sugar log.

## 2018-02-07 DIAGNOSIS — R69 Illness, unspecified: Secondary | ICD-10-CM | POA: Diagnosis not present

## 2018-02-16 ENCOUNTER — Other Ambulatory Visit: Payer: Self-pay | Admitting: Internal Medicine

## 2018-02-16 DIAGNOSIS — E785 Hyperlipidemia, unspecified: Secondary | ICD-10-CM

## 2018-02-16 NOTE — Telephone Encounter (Signed)
Ok

## 2018-02-16 NOTE — Telephone Encounter (Signed)
Is this okay to refill? 

## 2018-03-18 DIAGNOSIS — R69 Illness, unspecified: Secondary | ICD-10-CM | POA: Diagnosis not present

## 2018-03-24 ENCOUNTER — Ambulatory Visit: Payer: Medicare HMO | Admitting: *Deleted

## 2018-03-25 NOTE — Progress Notes (Signed)
Subjective:   Brandi Mccormick is a 77 y.o. female who presents for Medicare Annual (Subsequent) preventive examination.  Review of Systems: No ROS.  Medicare Wellness Visit. Additional risk factors are reflected in the social history. Cardiac Risk Factors include: advanced age (>54men, >42 women);diabetes mellitus;dyslipidemia;hypertension Sleep patterns: Doesn't sleep well. Feels rested though per pt.  Home Safety/Smoke Alarms: Feels safe in home. Smoke alarms in place.  Living environment; residence and Firearm Safety: 1 story home. Lives with husband.    Female:        Mammo- utd       Dexa scan- declines       CCS- due 2023 Eye-UTD per pt. Last visit in April.     Objective:     Vitals: BP 136/78 (BP Location: Left Arm, Patient Position: Sitting, Cuff Size: Normal)   Pulse 91   Ht 5\' 4"  (1.626 m)   Wt 126 lb (57.2 kg)   SpO2 96%   BMI 21.63 kg/m   Body mass index is 21.63 kg/m.  Advanced Directives 04/02/2018 04/10/2017 03/21/2017 02/05/2017 06/23/2015 06/23/2015 04/24/2015  Does Patient Have a Medical Advance Directive? No No No No No No No  Would patient like information on creating a medical advance directive? No - Patient declined - No - Patient declined - No - patient declined information Yes - Scientist, clinical (histocompatibility and immunogenetics) given Yes - Scientist, clinical (histocompatibility and immunogenetics) given    Tobacco Social History   Tobacco Use  Smoking Status Never Smoker  Smokeless Tobacco Never Used     Counseling given: Not Answered   Clinical Intake:  Pain : No/denies pain   Past Medical History:  Diagnosis Date  . Diabetes ([place])   . GERD (gastroesophageal reflux disease)   . Hyperlipidemia   . Hypertension   . Hyperthyroidism   . Pneumonia   . Shingles    Past Surgical History:  Procedure Laterality Date  . COLONOSCOPY    . DENTAL SURGERY     Family History  Problem Relation Age of Onset  . Diabetes Sister   . Diabetes Brother   . Diabetes Sister   . Diabetes Mother        and on  mother's side of family  . Colon cancer Mother   . Stroke Mother   . Diabetes Father        and on father's side of family  . Heart disease Neg Hx   . Rectal cancer Neg Hx   . Stomach cancer Neg Hx    Social History   Socioeconomic History  . Marital status: Married    Spouse name: Not on file  . Number of children: Not on file  . Years of education: Not on file  . Highest education level: Not on file  Occupational History  . 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 Financial resource strain: Not on file  . Food insecurity:    Worry: Not on file    Inability: Not on file  . Transportation needs:    Medical: Not on file    Non-medical: Not on file  Tobacco Use  . Smoking status: Never Smoker  . Smokeless tobacco: Never Used  Substance and Sexual Activity  . Alcohol use: Yes    Alcohol/week: 0.0 oz    Comment: occasional wine cooler  . Drug use: No  . Sexual activity: Never    Partners: Male  Lifestyle  . Physical activity:    Days per week: Not on  file    Minutes per session: Not on file  . Stress: Not on file  Relationships  . Social connections:    Talks on phone: Not on file    Gets together: Not on file    Attends religious service: Not on file    Active member of club or organization: Not on file    Attends meetings of clubs or organizations: Not on file    Relationship status: Not on file  Other Topics Concern  . Not on file  Social History Narrative   Exercise-- yard work,  Walking in [place] Encounter Medications as of 04/02/2018  Medication Sig  . amLODipine (NORVASC) 10 MG tablet Take 1 tablet (10 mg total) by mouth daily.  Marland Kitchen atorvastatin (LIPITOR) 40 MG tablet TAKE 1 TABLET BY MOUTH ONCE DAILY  . erythromycin ophthalmic ointment erythromycin 5 mg/gram (0.5 %) eye ointment  APPLY 1 CM RIBBON INTO THE LOWER CONJUNCTIVAL SAC(S) IN THE AFFECTED EYE(S) BY OPHTHALMIC ROUTE 4 TIMES A DAY FOR 5 DAYS.  Marland Kitchen ezetimibe (ZETIA) 10 MG  tablet Take 1 tablet (10 mg total) by mouth daily.  Marland Kitchen GLIPIZIDE XL 5 MG 24 hr tablet TAKE ONE TABLET BY MOUTH BEFORE BREAKFAST AND ONE-HALF BEFORE SUPPER  . metFORMIN (GLUCOPHAGE-XR) 750 MG 24 hr tablet Take 1 tablet (750 mg total) by mouth 2 (two) times daily with a meal.  . metoprolol succinate (TOPROL-XL) 25 MG 24 hr tablet Take 1 tablet (25 mg total) by mouth daily.  . NONFORMULARY OR COMPOUNDED [place] compound:  Onychomycosis Nail Lacquer - Fluconazole 2%, Terbinafine 1%, DMSO, dispense 120 grams, appt to affected area once daily.  +2refills.  Glory Rosebush DELICA LANCETS 17C MISC USE   TO CHECK GLUCOSE TWICE DAILY  . ONETOUCH VERIO test strip USE 1 STRIP TO CHECK GLUCOSE ONCE DAILY  . ranitidine (ZANTAC) 150 MG tablet Take 1 tablet (150 mg total) by mouth 2 (two) times daily. (Patient taking differently: Take 150 mg by mouth at bedtime. )  . [DISCONTINUED] moxifloxacin (VIGAMOX) 0.5 % ophthalmic solution Place 1 drop into the left eye 3 (three) times daily.   Facility-Administered Encounter Medications as of 04/02/2018  Medication  . 0.9 %  sodium chloride infusion    Activities of Daily Living In your present state of health, do you have any difficulty performing the following activities: 04/02/2018  Hearing? N  Vision? N  Difficulty concentrating or making decisions? N  Walking or climbing stairs? N  Dressing or bathing? N  Doing errands, shopping? N  Preparing Food and eating ? N  Using the Toilet? N  In the past six months, have you accidently leaked urine? N  Do you have problems with loss of bowel control? N  Managing your Medications? N  Managing your Finances? N  Housekeeping or managing your Housekeeping? N  Some recent data might be hidden    Patient Care Team: Carollee Herter, Alferd Apa, DO as PCP - General (Family Medicine) Philemon Kingdom, MD as Consulting Physician (Internal Medicine) Ara Kussmaul, MD as Consulting Physician  (Ophthalmology) [facility] [place] [facility] as Consulting Physician (Podiatry)    Assessment:   This is a routine wellness examination for Jackelin. Physical assessment deferred to PCP.  Exercise Activities and Dietary recommendations Current Exercise Habits: The patient does not participate in regular exercise at present, Exercise limited by: None identified Diet (meal preparation, eat out, water intake, caffeinated beverages, dairy products, fruits and vegetables):  Breakfast: egg and cheese  Lunch:hot dog and cake Dinner: steak Does not drink enough water   Goals    . DIET - INCREASE WATER INTAKE    . Have 3 meals a day     At lunch add maybe Glucerna, Smoothies with protein, Sandwiches with lean protein/veggies, etc.     . Reduce sugar intake to 25 grams per day     Trade cake and pie for fruit.         Fall Risk Fall Risk  04/02/2018 03/21/2017 06/23/2015 06/23/2015 04/24/2015  Falls in the past year? No No No No No  Risk for fall due to : - - - - -    Depression Screen PHQ 2/9 Scores 04/02/2018 03/21/2017 06/23/2015 06/23/2015  PHQ - 2 Score 0 0 0 0     Cognitive Function MMSE - Mini Mental State Exam 04/02/2018 03/21/2017 04/24/2015  Orientation to time 5 5 5   Orientation to Place 5 5 5   Registration 3 3 3   Attention/ Calculation 4 3 4   Recall 2 2 2   Language- name 2 objects 2 2 2   Language- repeat 1 1 1   Language- follow 3 step command 3 3 3   Language- read & follow direction 1 1 1   Write a sentence 1 1 1   Copy design 1 1 1   Total score 28 27 28         Immunization History  Administered Date(s) Administered  . Pneumococcal Polysaccharide-23 11/16/2013    Screening Tests Health Maintenance  Topic Date Due  . FOOT EXAM  10/23/2017  . URINE MICROALBUMIN  11/15/2017  . INFLUENZA VACCINE  07/03/2018 (Originally 04/02/2018)  . PNA vac Low Risk Adult (2 of 2 - PCV13) 04/03/2019 (Originally 11/17/2014)  . HEMOGLOBIN A1C  08/04/2018  . OPHTHALMOLOGY  EXAM  12/14/2018  . COLONOSCOPY  02/05/2022  . TETANUS/TDAP  06/22/2025  . DEXA SCAN  Addressed       Plan:    Please schedule your next medicare wellness visit with me in 1 yr.  Eat heart healthy diet (full of fruits, vegetables, whole grains, lean protein, water--limit salt, fat, and sugar intake) and increase physical activity as tolerated.  Continue doing brain stimulating activities (puzzles, reading, adult coloring books, staying active) to keep memory sharp.    I have personally reviewed and noted the following in the patient's chart:   . Medical and social history . Use of alcohol, tobacco or illicit drugs  . Current medications and supplements . Functional ability and status . Nutritional status . Physical activity . Advanced directives . List of other physicians . Hospitalizations, surgeries, and ER visits in previous 12 months . Vitals . Screenings to include cognitive, depression, and falls . Referrals and appointments  In addition, I have reviewed and discussed with patient certain preventive protocols, quality metrics, and best practice recommendations. A written personalized care plan for preventive services as well as general preventive health recommendations were provided to patient.     Shela Nevin, [place]  04/02/2018

## 2018-04-02 ENCOUNTER — Encounter: Payer: Self-pay | Admitting: Family Medicine

## 2018-04-02 ENCOUNTER — Ambulatory Visit (INDEPENDENT_AMBULATORY_CARE_PROVIDER_SITE_OTHER): Payer: Medicare HMO | Admitting: Family Medicine

## 2018-04-02 ENCOUNTER — Encounter: Payer: Self-pay | Admitting: *Deleted

## 2018-04-02 ENCOUNTER — Ambulatory Visit (INDEPENDENT_AMBULATORY_CARE_PROVIDER_SITE_OTHER): Payer: Medicare HMO | Admitting: *Deleted

## 2018-04-02 ENCOUNTER — Ambulatory Visit: Payer: Medicare HMO | Admitting: Family Medicine

## 2018-04-02 VITALS — BP 184/79 | HR 91 | Ht 64.0 in | Wt 126.0 lb

## 2018-04-02 VITALS — BP 136/78 | HR 91 | Ht 64.0 in | Wt 126.0 lb

## 2018-04-02 DIAGNOSIS — Z0000 Encounter for general adult medical examination without abnormal findings: Secondary | ICD-10-CM

## 2018-04-02 DIAGNOSIS — E785 Hyperlipidemia, unspecified: Secondary | ICD-10-CM | POA: Diagnosis not present

## 2018-04-02 DIAGNOSIS — I10 Essential (primary) hypertension: Secondary | ICD-10-CM | POA: Diagnosis not present

## 2018-04-02 DIAGNOSIS — E118 Type 2 diabetes mellitus with unspecified complications: Secondary | ICD-10-CM

## 2018-04-02 DIAGNOSIS — E1169 Type 2 diabetes mellitus with other specified complication: Secondary | ICD-10-CM

## 2018-04-02 DIAGNOSIS — Z8639 Personal history of other endocrine, nutritional and metabolic disease: Secondary | ICD-10-CM | POA: Diagnosis not present

## 2018-04-02 DIAGNOSIS — E119 Type 2 diabetes mellitus without complications: Secondary | ICD-10-CM

## 2018-04-02 LAB — COMPREHENSIVE METABOLIC PANEL
ALBUMIN: 3.3 g/dL — AB (ref 3.5–5.2)
ALT: 22 U/L (ref 0–35)
AST: 28 U/L (ref 0–37)
Alkaline Phosphatase: 106 U/L (ref 39–117)
BUN: 20 mg/dL (ref 6–23)
CALCIUM: 9.6 mg/dL (ref 8.4–10.5)
CHLORIDE: 103 meq/L (ref 96–112)
CO2: 26 mEq/L (ref 19–32)
CREATININE: 0.98 mg/dL (ref 0.40–1.20)
GFR: 58.57 mL/min — ABNORMAL LOW (ref >60.00)
Glucose, Bld: 180 mg/dL — ABNORMAL HIGH (ref 70–99)
POTASSIUM: 3.7 meq/L (ref 3.5–5.1)
Sodium: 137 mEq/L (ref 135–145)
Total Bilirubin: 0.8 mg/dL (ref 0.2–1.2)
Total Protein: 6.9 g/dL (ref 6.0–8.3)

## 2018-04-02 LAB — LIPID PANEL
CHOLESTEROL: 328 mg/dL — AB (ref 0–200)
HDL: 69.5 mg/dL (ref >39.00)
LDL CALC: 237 mg/dL — AB (ref 0–99)
NonHDL: 258.23
TRIGLYCERIDES: 105 mg/dL (ref 0.0–149.0)
Total CHOL/HDL Ratio: 5
VLDL: 21 mg/dL (ref 0.0–40.0)

## 2018-04-02 LAB — TSH: TSH: 1.11 u[IU]/mL (ref 0.35–4.50)

## 2018-04-02 LAB — MICROALBUMIN / CREATININE URINE RATIO
CREATININE, U: 97 mg/dL
MICROALB UR: 410 mg/dL — AB (ref 0.0–1.9)
Microalb Creat Ratio: 422.7 mg/g — ABNORMAL HIGH (ref 0.0–30.0)

## 2018-04-02 MED ORDER — METOPROLOL SUCCINATE ER 50 MG PO TB24: 50.0000 mg | ORAL_TABLET | Freq: Every day | ORAL | 3 refills | Status: DC

## 2018-04-02 NOTE — Patient Instructions (Signed)
Please schedule your next medicare wellness visit with me in 1 yr.  Eat heart healthy diet (full of fruits, vegetables, whole grains, lean protein, water--limit salt, fat, and sugar intake) and increase physical activity as tolerated.  Continue doing brain stimulating activities (puzzles, reading, adult coloring books, staying active) to keep memory sharp.   Brandi Mccormick , Thank you for taking time to come for your Medicare Wellness Visit. I appreciate your ongoing commitment to your health goals. Please review the following plan we discussed and let me know if I can assist you in the future.   These are the goals we discussed: Goals    . DIET - INCREASE WATER INTAKE    . Have 3 meals a day     At lunch add maybe Glucerna, Smoothies with protein, Sandwiches with lean protein/veggies, etc.     . Reduce sugar intake to 25 grams per day     Trade cake and pie for fruit.         This is a list of the screening recommended for you and due dates:  Health Maintenance  Topic Date Due  . Complete foot exam   10/23/2017  . Urine Protein Check  11/15/2017  . Flu Shot  07/03/2018*  . Pneumonia vaccines (2 of 2 - PCV13) 04/03/2019*  . Hemoglobin A1C  08/04/2018  . Eye exam for diabetics  12/14/2018  . Colon Cancer Screening  02/05/2022  . Tetanus Vaccine  06/22/2025  . DEXA scan (bone density measurement)  Addressed  *Topic was postponed. The date shown is not the original due date.    Health Maintenance for Postmenopausal Women Menopause is a normal process in which your reproductive ability comes to an end. This process happens gradually over a span of months to years, usually between the ages of 75 and 64. Menopause is complete when you have missed 12 consecutive menstrual periods. It is important to talk with your health care provider about some of the most common conditions that affect postmenopausal women, such as heart disease, cancer, and bone loss (osteoporosis). Adopting a healthy  lifestyle and getting preventive care can help to promote your health and wellness. Those actions can also lower your chances of developing some of these common conditions. What should I know about menopause? During menopause, you may experience a number of symptoms, such as:  Moderate-to-severe hot flashes.  Night sweats.  Decrease in sex drive.  Mood swings.  Headaches.  Tiredness.  Irritability.  Memory problems.  Insomnia.  Choosing to treat or not to treat menopausal changes is an individual decision that you make with your health care provider. What should I know about hormone replacement therapy and supplements? Hormone therapy products are effective for treating symptoms that are associated with menopause, such as hot flashes and night sweats. Hormone replacement carries certain risks, especially as you become older. If you are thinking about using estrogen or estrogen with progestin treatments, discuss the benefits and risks with your health care provider. What should I know about heart disease and stroke? Heart disease, heart attack, and stroke become more likely as you age. This may be due, in part, to the hormonal changes that your body experiences during menopause. These can affect how your body processes dietary fats, triglycerides, and cholesterol. Heart attack and stroke are both medical emergencies. There are many things that you can do to help prevent heart disease and stroke:  Have your blood pressure checked at least every 1-2 years. High blood pressure  causes heart disease and increases the risk of stroke.  If you are 51-61 years old, ask your health care provider if you should take aspirin to prevent a heart attack or a stroke.  Do not use any tobacco products, including cigarettes, chewing tobacco, or electronic cigarettes. If you need help quitting, ask your health care provider.  It is important to eat a healthy diet and maintain a healthy weight. ? Be  sure to include plenty of vegetables, fruits, low-fat dairy products, and lean protein. ? Avoid eating foods that are high in solid fats, added sugars, or salt (sodium).  Get regular exercise. This is one of the most important things that you can do for your health. ? Try to exercise for at least 150 minutes each week. The type of exercise that you do should increase your heart rate and make you sweat. This is known as moderate-intensity exercise. ? Try to do strengthening exercises at least twice each week. Do these in addition to the moderate-intensity exercise.  Know your numbers.Ask your health care provider to check your cholesterol and your blood glucose. Continue to have your blood tested as directed by your health care provider.  What should I know about cancer screening? There are several types of cancer. Take the following steps to reduce your risk and to catch any cancer development as early as possible. Breast Cancer  Practice breast self-awareness. ? This means understanding how your breasts normally appear and feel. ? It also means doing regular breast self-exams. Let your health care provider know about any changes, no matter how small.  If you are 30 or older, have a clinician do a breast exam (clinical breast exam or CBE) every year. Depending on your age, family history, and medical history, it may be recommended that you also have a yearly breast X-ray (mammogram).  If you have a family history of breast cancer, talk with your health care provider about genetic screening.  If you are at high risk for breast cancer, talk with your health care provider about having an MRI and a mammogram every year.  Breast cancer (BRCA) gene test is recommended for women who have family members with BRCA-related cancers. Results of the assessment will determine the need for genetic counseling and BRCA1 and for BRCA2 testing. BRCA-related cancers include these types: ? Breast. This occurs in  males or females. ? Ovarian. ? Tubal. This may also be called fallopian tube cancer. ? Cancer of the abdominal or pelvic lining (peritoneal cancer). ? Prostate. ? Pancreatic.  Cervical, Uterine, and Ovarian Cancer Your health care provider may recommend that you be screened regularly for cancer of the pelvic organs. These include your ovaries, uterus, and vagina. This screening involves a pelvic exam, which includes checking for microscopic changes to the surface of your cervix (Pap test).  For women ages 21-65, health care providers may recommend a pelvic exam and a Pap test every three years. For women ages 10-65, they may recommend the Pap test and pelvic exam, combined with testing for human papilloma virus (HPV), every five years. Some types of HPV increase your risk of cervical cancer. Testing for HPV may also be done on women of any age who have unclear Pap test results.  Other health care providers may not recommend any screening for nonpregnant women who are considered low risk for pelvic cancer and have no symptoms. Ask your health care provider if a screening pelvic exam is right for you.  If you have had  past treatment for cervical cancer or a condition that could lead to cancer, you need Pap tests and screening for cancer for at least 20 years after your treatment. If Pap tests have been discontinued for you, your risk factors (such as having a new sexual partner) need to be reassessed to determine if you should start having screenings again. Some women have medical problems that increase the chance of getting cervical cancer. In these cases, your health care provider may recommend that you have screening and Pap tests more often.  If you have a family history of uterine cancer or ovarian cancer, talk with your health care provider about genetic screening.  If you have vaginal bleeding after reaching menopause, tell your health care provider.  There are currently no reliable tests  available to screen for ovarian cancer.  Lung Cancer Lung cancer screening is recommended for adults 2-80 years old who are at high risk for lung cancer because of a history of smoking. A yearly low-dose CT scan of the lungs is recommended if you:  Currently smoke.  Have a history of at least 30 pack-years of smoking and you currently smoke or have quit within the past 15 years. A pack-year is smoking an average of one pack of cigarettes per day for one year.  Yearly screening should:  Continue until it has been 15 years since you quit.  Stop if you develop a health problem that would prevent you from having lung cancer treatment.  Colorectal Cancer  This type of cancer can be detected and can often be prevented.  Routine colorectal cancer screening usually begins at age 82 and continues through age 64.  If you have risk factors for colon cancer, your health care provider may recommend that you be screened at an earlier age.  If you have a family history of colorectal cancer, talk with your health care provider about genetic screening.  Your health care provider may also recommend using home test kits to check for hidden blood in your stool.  A small camera at the end of a tube can be used to examine your colon directly (sigmoidoscopy or colonoscopy). This is done to check for the earliest forms of colorectal cancer.  Direct examination of the colon should be repeated every 5-10 years until age 70. However, if early forms of precancerous polyps or small growths are found or if you have a family history or genetic risk for colorectal cancer, you may need to be screened more often.  Skin Cancer  Check your skin from head to toe regularly.  Monitor any moles. Be sure to tell your health care provider: ? About any new moles or changes in moles, especially if there is a change in a mole's shape or color. ? If you have a mole that is larger than the size of a pencil eraser.  If any  of your family members has a history of skin cancer, especially at a young age, talk with your health care provider about genetic screening.  Always use sunscreen. Apply sunscreen liberally and repeatedly throughout the day.  Whenever you are outside, protect yourself by wearing long sleeves, pants, a wide-brimmed hat, and sunglasses.  What should I know about osteoporosis? Osteoporosis is a condition in which bone destruction happens more quickly than new bone creation. After menopause, you may be at an increased risk for osteoporosis. To help prevent osteoporosis or the bone fractures that can happen because of osteoporosis, the following is recommended:  If you  are 60-57 years old, get at least 1,000 mg of calcium and at least 600 mg of vitamin D per day.  If you are older than age 23 but younger than age 87, get at least 1,200 mg of calcium and at least 600 mg of vitamin D per day.  If you are older than age 24, get at least 1,200 mg of calcium and at least 800 mg of vitamin D per day.  Smoking and excessive alcohol intake increase the risk of osteoporosis. Eat foods that are rich in calcium and vitamin D, and do weight-bearing exercises several times each week as directed by your health care provider. What should I know about how menopause affects my mental health? Depression may occur at any age, but it is more common as you become older. Common symptoms of depression include:  Low or sad mood.  Changes in sleep patterns.  Changes in appetite or eating patterns.  Feeling an overall lack of motivation or enjoyment of activities that you previously enjoyed.  Frequent crying spells.  Talk with your health care provider if you think that you are experiencing depression. What should I know about immunizations? It is important that you get and maintain your immunizations. These include:  Tetanus, diphtheria, and pertussis (Tdap) booster vaccine.  Influenza every year before the flu  season begins.  Pneumonia vaccine.  Shingles vaccine.  Your health care provider may also recommend other immunizations. This information is not intended to replace advice given to you by your health care provider. Make sure you discuss any questions you have with your health care provider. Document Released: 10/11/2005 Document Revised: 03/08/2016 Document Reviewed: 05/23/2015 Elsevier Interactive Patient Education  2018 [employer].

## 2018-04-02 NOTE — Assessment & Plan Note (Signed)
Tolerating statin, encouraged heart healthy diet, avoid trans fats, minimize simple carbs and saturated fats. Increase exercise as tolerated 

## 2018-04-02 NOTE — Assessment & Plan Note (Signed)
Per endo °

## 2018-04-02 NOTE — Patient Instructions (Signed)

## 2018-04-02 NOTE — Progress Notes (Signed)
Patient ID: Brandi Mccormick, female    DOB: Mar 02, 1941  Age: 77 y.o. MRN: [id number]    Subjective:  Subjective  HPI Brandi Mccormick presents for f/u bp and cholesterol.  Pt sees endo for DM  HYPERTENSION   Blood pressure range-not checking   Chest pain- no      Dyspnea- no Lightheadedness- no   Edema- no  Other side effects - no   Medication compliance: good Low salt diet- yes    DIABETES    Blood Sugar ranges-per endo  Polyuria- no New Visual problems- no  Hypoglycemic symptoms- no  Other side effects-no Medication compliance - good Foot exam- per podiatry    HYPERLIPIDEMIA  Medication compliance- good RUQ pain- no  Muscle aches- mo Other side effects-no  Review of Systems  Constitutional: Negative for chills and fever.  HENT: Negative for congestion and hearing loss.   Eyes: Negative for discharge.  Respiratory: Negative for cough and shortness of breath.   Cardiovascular: Negative for chest pain, palpitations and leg swelling.  Gastrointestinal: Negative for abdominal pain, blood in stool, constipation, diarrhea, nausea and vomiting.  Genitourinary: Negative for dysuria, frequency, hematuria and urgency.  Musculoskeletal: Negative for back pain and myalgias.  Skin: Negative for rash.  Allergic/Immunologic: Negative for environmental allergies.  Neurological: Negative for dizziness, weakness and headaches.  Hematological: Does not bruise/bleed easily.  Psychiatric/Behavioral: Negative for suicidal ideas. The patient is not nervous/anxious.     History Past Medical History:  Diagnosis Date  . Diabetes ([place])   . GERD (gastroesophageal reflux disease)   . Hyperlipidemia   . Hypertension   . Hyperthyroidism   . Pneumonia   . Shingles     She has a past surgical history that includes Dental surgery and Colonoscopy.   Her family history includes Colon cancer in her mother; Diabetes in her brother, father, mother, sister, and sister; Stroke in her mother.She  reports that she has never smoked. She has never used smokeless tobacco. She reports that she drinks alcohol. She reports that she does not use drugs.  Current Outpatient Medications on File Prior to Visit  Medication Sig Dispense Refill  . amLODipine (NORVASC) 10 MG tablet Take 1 tablet (10 mg total) by mouth daily. 90 tablet 3  . atorvastatin (LIPITOR) 40 MG tablet TAKE 1 TABLET BY MOUTH ONCE DAILY 90 tablet 1  . erythromycin ophthalmic ointment erythromycin 5 mg/gram (0.5 %) eye ointment  APPLY 1 CM RIBBON INTO THE LOWER CONJUNCTIVAL SAC(S) IN THE AFFECTED EYE(S) BY OPHTHALMIC ROUTE 4 TIMES A DAY FOR 5 DAYS.    Marland Kitchen ezetimibe (ZETIA) 10 MG tablet Take 1 tablet (10 mg total) by mouth daily. 30 tablet 2  . GLIPIZIDE XL 5 MG 24 hr tablet TAKE ONE TABLET BY MOUTH BEFORE BREAKFAST AND ONE-HALF BEFORE SUPPER 135 tablet 2  . metFORMIN (GLUCOPHAGE-XR) 750 MG 24 hr tablet Take 1 tablet (750 mg total) by mouth 2 (two) times daily with a meal. 180 tablet 3  . NONFORMULARY OR COMPOUNDED [place] compound:  Onychomycosis Nail Lacquer - Fluconazole 2%, Terbinafine 1%, DMSO, dispense 120 grams, appt to affected area once daily.  +2refills. 120 each 11  . ONETOUCH DELICA LANCETS 24O MISC USE   TO CHECK GLUCOSE TWICE DAILY 100 each 7  . ONETOUCH VERIO test strip USE 1 STRIP TO CHECK GLUCOSE ONCE DAILY 50 each 7  . ranitidine (ZANTAC) 150 MG tablet Take 1 tablet (150 mg total) by mouth 2 (two) times daily. (Patient taking  differently: Take 150 mg by mouth at bedtime. ) 60 tablet 1   Current Facility-Administered Medications on File Prior to Visit  Medication Dose Route Frequency Provider Last Rate Last Dose  . 0.9 %  sodium chloride infusion  500 mL Intravenous Continuous Armbruster, Carlota Raspberry, MD         Objective:  Objective  Physical Exam  Constitutional: She is oriented to person, place, and time. She appears well-developed and well-nourished.  HENT:  Head: Normocephalic and atraumatic.    Eyes: Conjunctivae and EOM are normal.  Neck: Normal range of motion. Neck supple. No JVD present. Carotid bruit is not present. No thyromegaly present.  Cardiovascular: Normal rate, regular rhythm and normal heart sounds.  No murmur heard. Pulmonary/Chest: Effort normal and breath sounds normal. No respiratory distress. She has no wheezes. She has no rales. She exhibits no tenderness.  Musculoskeletal: She exhibits no edema.  Neurological: She is alert and oriented to person, place, and time.  Psychiatric: She has a normal mood and affect.  Nursing note and vitals reviewed.  BP (!) 184/79 (BP Location: Left Arm, Patient Position: Sitting, Cuff Size: Normal)   Pulse 91   Ht 5\' 4"  (1.626 m)   Wt 126 lb (57.2 kg)   SpO2 99%   BMI 21.63 kg/m  Wt Readings from Last 3 Encounters:  04/02/18 126 lb (57.2 kg)  04/02/18 126 lb (57.2 kg)  02/02/18 127 lb 6.4 oz (57.8 kg)     Lab Results  Component Value Date   WBC 10.5 (H) 04/10/2017   HGB 16.7 (H) 04/10/2017   HCT 48.6 (H) 04/10/2017   PLT 807 (H) 04/10/2017   GLUCOSE 141 (H) 04/18/2017   CHOL 181 04/18/2017   TRIG 66.0 04/18/2017   HDL 52.70 04/18/2017   LDLCALC 115 (H) 04/18/2017   ALT 25 04/18/2017   AST 27 04/18/2017   NA 139 04/18/2017   K 3.7 04/18/2017   CL 105 04/18/2017   CREATININE 0.80 04/18/2017   BUN 17 04/18/2017   CO2 26 04/18/2017   TSH 1.13 07/03/2017   HGBA1C 7.6 (A) 02/02/2018   MICROALBUR 271.1 (H) 11/15/2016    Mr Abdomen W Wo Contrast  Result Date: 04/26/2017 CLINICAL DATA:  Right renal mass EXAM: MRI ABDOMEN WITHOUT AND WITH CONTRAST TECHNIQUE: Multiplanar multisequence MR imaging of the abdomen was performed both before and after the administration of intravenous contrast. CONTRAST:  42mL MULTIHANCE GADOBENATE DIMEGLUMINE 529 MG/ML IV SOLN COMPARISON:  Abdominal ultrasound dated 04/21/2017. CT abdomen/ pelvis dated 06/24/2008. FINDINGS: Lower chest: Lung bases are clear. Hepatobiliary: Liver is  within normal limits. Gallbladder is unremarkable. No intrahepatic or extrahepatic ductal dilatation. Pancreas:  Within normal limits. Spleen:  Within normal limits. Adrenals/Urinary Tract:  Adrenal glands are within normal limits. Kidneys are within normal limits. No renal cysts or enhancing renal lesion. No hydronephrosis. Stomach/Bowel: Stomach is within normal limits. Visualized bowel is unremarkable. Vascular/Lymphatic:  No evidence of abdominal aortic aneurysm. No suspicious abdominal lymphadenopathy. Other:  No abdominal ascites. Musculoskeletal: No focal osseous lesions. IMPRESSION: Negative MRI abdomen. No evidence of right renal mass. Electronically Signed   By: Julian Hy M.D.   On: 04/26/2017 17:34     Assessment & Plan:  Plan  I have discontinued Leslea G. Blessinger's metoprolol succinate. I am also having her start on metoprolol succinate. Additionally, I am having her maintain her ranitidine, ONETOUCH DELICA LANCETS 71G, ezetimibe, erythromycin, GLIPIZIDE XL, NONFORMULARY OR COMPOUNDED ITEM, amLODipine, ONETOUCH VERIO, metFORMIN, and atorvastatin. We will  continue to administer sodium chloride.  Meds ordered this encounter  Medications  . metoprolol succinate (TOPROL-XL) 50 MG 24 hr tablet    Sig: Take 1 tablet (50 mg total) by mouth daily. Take with or immediately following a meal.    Dispense:  90 tablet    Refill:  3    Problem List Items Addressed This Visit      Unprioritized   Diabetes mellitus type 2, noninsulin dependent ([place])    Per endo      Essential hypertension    Poorly controlled will alter medications, encouraged DASH diet, minimize caffeine and obtain adequate sleep. Report concerning symptoms and follow up as directed and as needed      Relevant Medications   metoprolol succinate (TOPROL-XL) 50 MG 24 hr tablet   Other Relevant Orders   Lipid panel   Comprehensive metabolic panel   Microalbumin / creatinine urine ratio   Hyperlipidemia associated  with type 2 diabetes mellitus ([facility]) - Primary   Relevant Medications   metoprolol succinate (TOPROL-XL) 50 MG 24 hr tablet   Other Relevant Orders   Lipid panel   Comprehensive metabolic panel   Microalbumin / creatinine urine ratio   Hyperlipidemia LDL goal <70    Tolerating statin, encouraged heart healthy diet, avoid trans fats, minimize simple carbs and saturated fats. Increase exercise as tolerated      Relevant Medications   metoprolol succinate (TOPROL-XL) 50 MG 24 hr tablet    Other Visit Diagnoses    Controlled type 2 diabetes mellitus with complication, without long-term current use of insulin ([place])       Relevant Orders   Microalbumin / creatinine urine ratio   History of thyroid disease       Relevant Orders   TSH      Follow-up: Return in about 6 months (around 10/03/2018) for annual exam, fasting.  [name], DO

## 2018-04-02 NOTE — Assessment & Plan Note (Signed)
Poorly controlled will alter medications, encouraged DASH diet, minimize caffeine and obtain adequate sleep. Report concerning symptoms and follow up as directed and as needed 

## 2018-04-02 NOTE — Progress Notes (Signed)
Reviewed  Yvonne R Lowne Chase, DO  

## 2018-04-03 ENCOUNTER — Other Ambulatory Visit: Payer: Self-pay

## 2018-04-03 DIAGNOSIS — R809 Proteinuria, unspecified: Secondary | ICD-10-CM

## 2018-04-03 DIAGNOSIS — E785 Hyperlipidemia, unspecified: Secondary | ICD-10-CM

## 2018-04-03 DIAGNOSIS — I10 Essential (primary) hypertension: Secondary | ICD-10-CM

## 2018-04-12 ENCOUNTER — Other Ambulatory Visit: Payer: Self-pay | Admitting: Internal Medicine

## 2018-04-13 DIAGNOSIS — R69 Illness, unspecified: Secondary | ICD-10-CM | POA: Diagnosis not present

## 2018-05-05 ENCOUNTER — Ambulatory Visit: Payer: Medicare HMO | Admitting: Sports Medicine

## 2018-05-05 ENCOUNTER — Encounter: Payer: Self-pay | Admitting: Sports Medicine

## 2018-05-05 DIAGNOSIS — B351 Tinea unguium: Secondary | ICD-10-CM

## 2018-05-05 DIAGNOSIS — E119 Type 2 diabetes mellitus without complications: Secondary | ICD-10-CM | POA: Diagnosis not present

## 2018-05-05 DIAGNOSIS — R69 Illness, unspecified: Secondary | ICD-10-CM | POA: Diagnosis not present

## 2018-05-05 DIAGNOSIS — M79672 Pain in left foot: Secondary | ICD-10-CM

## 2018-05-05 DIAGNOSIS — M79671 Pain in right foot: Secondary | ICD-10-CM | POA: Diagnosis not present

## 2018-05-05 NOTE — Progress Notes (Signed)
Patient ID: Brandi Mccormick, female   DOB: 1941-08-28, 77 y.o.   MRN: [id number]   Subjective: Brandi Mccormick is a 77 y.o. female patient with history of type 2 diabetes who presents to office today complaining of thick, long, painful nails  while ambulating in shoes; unable to trim. Patient states that the glucose reading this morning was not recorded. Admits kidney issues but no other changes to health or medications since last visit.    Patient Active Problem List   Diagnosis Date Noted  . Hyperlipidemia associated with type 2 diabetes mellitus ([place]) 04/02/2018  . 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 Pneumonia   . Essential hypertension 01/14/2017  . Hyperlipidemia LDL goal <70 11/15/2016  . Essential thrombocytosis ([place]) 07/01/2014  . Protein-calorie malnutrition, severe ([place]) 06/25/2014  . Protein-calorie malnutrition ([place]) 06/24/2014  . Hypokalemia 06/24/2014  . CAP (community acquired pneumonia) 06/24/2014  . H/O hyperthyroidism 12/21/2013  . Diabetes mellitus type 2, noninsulin dependent ([place]) 11/16/2013   Current Outpatient Medications on File Prior to Visit  Medication Sig Dispense Refill  . amLODipine (NORVASC) 10 MG tablet Take 1 tablet (10 mg total) by mouth daily. 90 tablet 3  . atorvastatin (LIPITOR) 40 MG tablet TAKE 1 TABLET BY MOUTH ONCE DAILY 90 tablet 1  . erythromycin ophthalmic ointment erythromycin 5 mg/gram (0.5 %) eye ointment  APPLY 1 CM RIBBON INTO THE LOWER CONJUNCTIVAL SAC(S) IN THE AFFECTED EYE(S) BY OPHTHALMIC ROUTE 4 TIMES A DAY FOR 5 DAYS.    Marland Kitchen ezetimibe (ZETIA) 10 MG tablet Take 1 tablet (10 mg total) by mouth daily. 30 tablet 2  . GLIPIZIDE XL 5 MG 24 hr tablet TAKE ONE TABLET BY MOUTH BEFORE BREAKFAST AND ONE-HALF BEFORE SUPPER 135 tablet 2  . lisinopril (PRINIVIL,ZESTRIL) 5 MG tablet lisinopril 5 mg tablet    . metFORMIN (GLUCOPHAGE-XR) 750 MG 24 hr tablet Take 1 tablet (750 mg total) by mouth 2 (two) times daily with a meal. 180 tablet 3  . metoprolol  succinate (TOPROL-XL) 50 MG 24 hr tablet Take 1 tablet (50 mg total) by mouth daily. Take with or immediately following a meal. 90 tablet 3  . NONFORMULARY OR COMPOUNDED [place] compound:  Onychomycosis Nail Lacquer - Fluconazole 2%, Terbinafine 1%, DMSO, dispense 120 grams, appt to affected area once daily.  +2refills. 120 each 11  . ONETOUCH DELICA LANCETS 41D MISC OneTouch Delica Lancets 33 gauge    . ONETOUCH VERIO test strip USE 1 STRIP TO CHECK GLUCOSE ONCE DAILY 50 each 7  . ranitidine (ZANTAC) 150 MG tablet Take 1 tablet (150 mg total) by mouth 2 (two) times daily. (Patient taking differently: Take 150 mg by mouth at bedtime. ) 60 tablet 1   Current Facility-Administered Medications on File Prior to Visit  Medication Dose Route Frequency Provider Last Rate Last Dose  . 0.9 %  sodium chloride infusion  500 mL Intravenous Continuous Armbruster, Carlota Raspberry, MD       Allergies  Allergen Reactions  . Penicillins Nausea And Vomiting   Objective: General: Patient is awake, alert, and oriented x 3 and in no acute distress.  Integument: Skin is warm, dry and supple bilateral. Nails are tender, long, thickened and  dystrophic with subungual debris, consistent with onychomycosis, 1-5 bilateral. No signs of infection. No open lesions or preulcerative lesions present bilateral. Remaining integument unremarkable.  Vasculature:  Dorsalis Pedis pulse 2/4 bilateral. Posterior Tibial pulse  1/4 bilateral.  Capillary fill time <3 sec 1-5 bilateral. Positive hair  growth to the level of the digits. Temperature gradient within normal limits. No varicosities present bilateral. Trace edema present bilateral.   Neurology: The patient has intact sensation measured with a 5.07/10g Semmes Weinstein Monofilament at all pedal sites bilateral . Vibratory sensation intact bilateral with tuning fork. No Babinski sign present bilateral.   Musculoskeletal: Asymptomatic mild bunion deformities noted  bilateral, R>L. Muscular strength 5/5 in all lower extremity muscular groups bilateral without pain or limitation on range of motion . No tenderness with calf compression bilateral.  Assessment and Plan: Problem List Items Addressed This Visit    None    Visit Diagnoses    Dermatophytosis of nail    -  Primary   Foot pain, bilateral       Diabetes mellitus without complication ([place])       Relevant Medications   lisinopril (PRINIVIL,ZESTRIL) 5 MG tablet     -Examined patient. -Discussed and educated patient on diabetic foot care, especially with regards to the vascular, neurological and musculoskeletal systems.  -Stressed the importance of good glycemic control and the detriment of not controlling glucose levels in relation to the foot. -Mechanically debrided all nails 1-5 bilateral using sterile nail nipper and filed with dremel without incident  -Recommend elevation of legs when sitting to assist with edema control and follow-up with primary care doctor and nephrologist for further recommendations in the setting of kidney disease -Patient to return in 2.5 to 3 months for at risk foot care -Patient advised to call the office if any problems or questions arise in the meantime.  [name], DPM

## 2018-05-11 ENCOUNTER — Other Ambulatory Visit: Payer: Self-pay | Admitting: Internal Medicine

## 2018-05-15 DIAGNOSIS — E1129 Type 2 diabetes mellitus with other diabetic kidney complication: Secondary | ICD-10-CM | POA: Diagnosis not present

## 2018-05-15 DIAGNOSIS — E785 Hyperlipidemia, unspecified: Secondary | ICD-10-CM | POA: Diagnosis not present

## 2018-05-15 DIAGNOSIS — R809 Proteinuria, unspecified: Secondary | ICD-10-CM | POA: Diagnosis not present

## 2018-05-15 DIAGNOSIS — R8271 Bacteriuria: Secondary | ICD-10-CM | POA: Diagnosis not present

## 2018-05-15 DIAGNOSIS — I129 Hypertensive chronic kidney disease with stage 1 through stage 4 chronic kidney disease, or unspecified chronic kidney disease: Secondary | ICD-10-CM | POA: Diagnosis not present

## 2018-05-22 ENCOUNTER — Other Ambulatory Visit (HOSPITAL_COMMUNITY): Payer: Self-pay | Admitting: Internal Medicine

## 2018-05-22 DIAGNOSIS — R809 Proteinuria, unspecified: Secondary | ICD-10-CM

## 2018-05-26 DIAGNOSIS — I129 Hypertensive chronic kidney disease with stage 1 through stage 4 chronic kidney disease, or unspecified chronic kidney disease: Secondary | ICD-10-CM | POA: Diagnosis not present

## 2018-06-01 ENCOUNTER — Other Ambulatory Visit: Payer: Self-pay | Admitting: Radiology

## 2018-06-02 ENCOUNTER — Emergency Department (HOSPITAL_COMMUNITY)
Admission: EM | Admit: 2018-06-02 | Discharge: 2018-06-02 | Disposition: A | Discharge status: Home / Self Care | Payer: Medicare HMO | Attending: Emergency Medicine | Admitting: Emergency Medicine

## 2018-06-02 ENCOUNTER — Emergency Department (HOSPITAL_COMMUNITY): Payer: Medicare HMO

## 2018-06-02 ENCOUNTER — Ambulatory Visit (HOSPITAL_COMMUNITY)
Admission: RE | Admit: 2018-06-02 | Discharge: 2018-06-02 | Disposition: A | Discharge status: Home / Self Care | Payer: Medicare HMO | Source: Ambulatory Visit | Attending: Internal Medicine | Admitting: Internal Medicine

## 2018-06-02 DIAGNOSIS — Z7984 Long term (current) use of oral hypoglycemic drugs: Secondary | ICD-10-CM | POA: Diagnosis not present

## 2018-06-02 DIAGNOSIS — I10 Essential (primary) hypertension: Secondary | ICD-10-CM | POA: Diagnosis not present

## 2018-06-02 DIAGNOSIS — N99820 Postprocedural hemorrhage and hematoma of a genitourinary system organ or structure following a genitourinary system procedure: Secondary | ICD-10-CM | POA: Insufficient documentation

## 2018-06-02 DIAGNOSIS — E119 Type 2 diabetes mellitus without complications: Secondary | ICD-10-CM | POA: Diagnosis not present

## 2018-06-02 DIAGNOSIS — R112 Nausea with vomiting, unspecified: Secondary | ICD-10-CM | POA: Diagnosis not present

## 2018-06-02 DIAGNOSIS — R809 Proteinuria, unspecified: Secondary | ICD-10-CM

## 2018-06-02 DIAGNOSIS — Z7982 Long term (current) use of aspirin: Secondary | ICD-10-CM | POA: Diagnosis not present

## 2018-06-02 DIAGNOSIS — N99840 Postprocedural hematoma of a genitourinary system organ or structure following a genitourinary system procedure: Secondary | ICD-10-CM

## 2018-06-02 DIAGNOSIS — K5730 Diverticulosis of large intestine without perforation or abscess without bleeding: Secondary | ICD-10-CM | POA: Diagnosis not present

## 2018-06-02 DIAGNOSIS — R109 Unspecified abdominal pain: Secondary | ICD-10-CM | POA: Diagnosis not present

## 2018-06-02 LAB — BASIC METABOLIC PANEL
ANION GAP: 11 (ref 5–15)
BUN: 19 mg/dL (ref 8–23)
CALCIUM: 9.1 mg/dL (ref 8.9–10.3)
CO2: 22 mmol/L (ref 22–32)
Chloride: 103 mmol/L (ref 98–111)
Creatinine, Ser: 1.49 mg/dL — ABNORMAL HIGH (ref 0.44–1.00)
GFR calc Af Amer: 38 mL/min — ABNORMAL LOW (ref >60)
GFR calc non Af Amer: 33 mL/min — ABNORMAL LOW (ref >60)
GLUCOSE: 260 mg/dL — AB (ref 70–99)
Potassium: 3.6 mmol/L (ref 3.5–5.1)
Sodium: 136 mmol/L (ref 135–145)

## 2018-06-02 LAB — URINALYSIS, MICROSCOPIC (REFLEX)
BACTERIA UA: NONE SEEN
RBC / HPF: >50 RBC/hpf (ref 0–5)
WBC, UA: NONE SEEN WBC/hpf (ref 0–5)

## 2018-06-02 LAB — URINALYSIS, ROUTINE W REFLEX MICROSCOPIC
Bilirubin Urine: NEGATIVE
Glucose, UA: 150 mg/dL — AB
Ketones, ur: 5 mg/dL — AB
Nitrite: NEGATIVE
PH: 6 (ref 5.0–8.0)
Protein, ur: 100 mg/dL — AB
Specific Gravity, Urine: 1.01 (ref 1.005–1.030)

## 2018-06-02 LAB — CBC
HCT: 51.7 % — ABNORMAL HIGH (ref 36.0–46.0)
HEMATOCRIT: 47 % — AB (ref 36.0–46.0)
HEMOGLOBIN: 14.6 g/dL (ref 12.0–15.0)
Hemoglobin: 16.3 g/dL — ABNORMAL HIGH (ref 12.0–15.0)
MCH: 25.3 pg — ABNORMAL LOW (ref 26.0–34.0)
MCH: 25.5 pg — ABNORMAL LOW (ref 26.0–34.0)
MCHC: 31.1 g/dL (ref 30.0–36.0)
MCHC: 31.5 g/dL (ref 30.0–36.0)
MCV: 81.5 fL (ref 78.0–100.0)
MCV: 81 fL (ref 78.0–100.0)
Platelets: 945 10*3/uL (ref 150–400)
Platelets: 1172 10*3/uL (ref 150–400)
RBC: 5.77 MIL/uL — ABNORMAL HIGH (ref 3.87–5.11)
RBC: 6.38 MIL/uL — ABNORMAL HIGH (ref 3.87–5.11)
RDW: 20.3 % — ABNORMAL HIGH (ref 11.5–15.5)
RDW: 20.6 % — AB (ref 11.5–15.5)
WBC: 10.7 10*3/uL — ABNORMAL HIGH (ref 4.0–10.5)
WBC: 22 10*3/uL — AB (ref 4.0–10.5)

## 2018-06-02 LAB — PROTIME-INR
INR: 0.97
PROTHROMBIN TIME: 12.8 s (ref 11.4–15.2)

## 2018-06-02 LAB — GLUCOSE, CAPILLARY: Glucose-Capillary: 143 mg/dL — ABNORMAL HIGH (ref 70–99)

## 2018-06-02 MED ORDER — FENTANYL CITRATE (PF) 100 MCG/2ML IJ SOLN
50.0000 ug | Freq: Once | INTRAMUSCULAR | Status: AC
  Administered 2018-06-02: 50 ug via INTRAVENOUS
  Filled 2018-06-02: qty 2

## 2018-06-02 MED ORDER — LISINOPRIL 20 MG PO TABS
20.0000 mg | ORAL_TABLET | ORAL | Status: AC
  Administered 2018-06-02: 20 mg via ORAL
  Filled 2018-06-02: qty 1

## 2018-06-02 MED ORDER — SODIUM CHLORIDE 0.9 % IV SOLN: INTRAVENOUS | Status: DC

## 2018-06-02 MED ORDER — FENTANYL CITRATE (PF) 100 MCG/2ML IJ SOLN
INTRAMUSCULAR | Status: AC
  Filled 2018-06-02: qty 2

## 2018-06-02 MED ORDER — ONDANSETRON HCL 4 MG/2ML IJ SOLN
4.0000 mg | Freq: Once | INTRAMUSCULAR | Status: AC
  Administered 2018-06-02: 4 mg via INTRAVENOUS
  Filled 2018-06-02: qty 2

## 2018-06-02 MED ORDER — MIDAZOLAM HCL 2 MG/2ML IJ SOLN
INTRAMUSCULAR | Status: AC
  Filled 2018-06-02: qty 2

## 2018-06-02 MED ORDER — MIDAZOLAM HCL 2 MG/2ML IJ SOLN
INTRAMUSCULAR | Status: AC | PRN
  Administered 2018-06-02: 1 mg via INTRAVENOUS

## 2018-06-02 MED ORDER — IOPAMIDOL (ISOVUE-300) INJECTION 61%
100.0000 mL | Freq: Once | INTRAVENOUS | Status: AC | PRN
  Administered 2018-06-02: 80 mL via INTRAVENOUS

## 2018-06-02 MED ORDER — LIDOCAINE-EPINEPHRINE 1 %-1:100000 IJ SOLN
INTRAMUSCULAR | Status: AC
  Filled 2018-06-02: qty 1

## 2018-06-02 MED ORDER — FENTANYL CITRATE (PF) 100 MCG/2ML IJ SOLN
INTRAMUSCULAR | Status: AC | PRN
  Administered 2018-06-02 (×2): 50 ug via INTRAVENOUS

## 2018-06-02 NOTE — Progress Notes (Signed)
Patient came back to IR c/o of Left sided groin/abdominal pain post renal biopsy completed earlier today. Patient also reports of distention to abdomen post procedure. Patient states pain has became increasingly worsened. Patient denies flank pain. Incision site assessed and looks remarkable. No bleeding or bruising noted. Patient denies fevers or chills. BP elevated 207/94. Dr. Pascal Lux made aware. Per Dr. Pascal Lux, patient is to be evaluated in ED. Patient agrees. PAtient taken to ED for evaluation.

## 2018-06-02 NOTE — Discharge Instructions (Signed)

## 2018-06-02 NOTE — Sedation Documentation (Signed)
Bed Rest starts at 0901

## 2018-06-02 NOTE — ED Notes (Signed)
Patient verbalizes understanding of medications and discharge instructions. No further questions at this time. VSS and patient ambulatory at discharge.   

## 2018-06-02 NOTE — ED Triage Notes (Signed)
Pt from IR; c/o LLQ pain; L kidney biopsy this am.  now c/o L flank/ groin pain; abd distention per pt; no blood in urine or stool; hypertensive, systolic over 168, has taken meds this am; concern for bleed; no fevers or chills; Dr. Pascal Lux suggesting CT

## 2018-06-02 NOTE — Procedures (Signed)
Pre Procedure Dx: Proteinuria Post Procedural Dx: Same  Technically successful US guided biopsy of inferior pole of the left kidney  EBL: Minimal Procedure complicated by development of a small asymptomatic left sided perinephric hematoma.  Ronny Bacon, MD Pager #: [phone]

## 2018-06-02 NOTE — ED Notes (Signed)
MD notified of pt's continued HTN; will administer pain medication and continue to monitor

## 2018-06-02 NOTE — Sedation Documentation (Signed)
Writer informed Dr. Pascal Lux of patients bp. Verbal order obtained to give fentanyl IVP. Orders followed.

## 2018-06-02 NOTE — ED Provider Notes (Signed)
[facility] EMERGENCY DEPARTMENT Provider Note   CSN: [id number] Arrival date & time: 06/02/18  1654     History   Chief Complaint Chief Complaint  Patient presents with  . Abdominal Pain    HPI Brandi Mccormick is a 77 y.o. female.  HPI Patient presents with left-sided abdominal pain.  Had a ultrasound-guided left renal biopsy this morning.  Went home and then began to have pain in her left abdomen.  States it is rather severe.  Has had nausea and some vomiting.  No hematuria.  No blood in the stool.  Pain is sharp.  Worse with certain movements. Past Medical History:  Diagnosis Date  . Diabetes ([place])   . GERD (gastroesophageal reflux disease)   . Hyperlipidemia   . Hypertension   . Hyperthyroidism   . Pneumonia   . Shingles     Patient Active Problem List   Diagnosis Date Noted  . Hyperlipidemia associated with type 2 diabetes mellitus ([place]) 04/02/2018  . 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 Pneumonia   . Essential hypertension 01/14/2017  . Hyperlipidemia LDL goal <70 11/15/2016  . Essential thrombocytosis ([place]) 07/01/2014  . Protein-calorie malnutrition, severe ([place]) 06/25/2014  . Protein-calorie malnutrition ([place]) 06/24/2014  . Hypokalemia 06/24/2014  . CAP (community acquired pneumonia) 06/24/2014  . H/O hyperthyroidism 12/21/2013  . Diabetes mellitus type 2, noninsulin dependent ([place]) 11/16/2013    Past Surgical History:  Procedure Laterality Date  . COLONOSCOPY    . DENTAL SURGERY       OB History   None      Home Medications    Prior to Admission medications   Medication Sig Start Date End Date Taking? Authorizing Provider  aspirin EC 81 MG tablet Take 81 mg by mouth daily.   Yes [provider]  atorvastatin (LIPITOR) 40 MG tablet TAKE 1 TABLET BY MOUTH ONCE DAILY Patient taking differently: Take 40 mg by mouth daily.  02/16/18  Yes Philemon Kingdom, MD  furosemide (LASIX) 20 MG tablet Take 20 mg by mouth 2 (two)  times daily.   Yes [provider]  lisinopril (PRINIVIL,ZESTRIL) 20 MG tablet Take 20 mg by mouth daily.    Yes [provider]  metFORMIN (GLUCOPHAGE-XR) 750 MG 24 hr tablet Take 1 tablet (750 mg total) by mouth 2 (two) times daily with a meal. Patient taking differently: Take 750 mg by mouth daily with breakfast.  02/02/18  Yes Philemon Kingdom, MD  ranitidine (ZANTAC) 150 MG tablet Take 1 tablet (150 mg total) by mouth 2 (two) times daily. Patient taking differently: Take 150 mg by mouth at bedtime as needed for heartburn.  10/18/14  Yes Lowne Chase, Alferd Apa, DO  ONETOUCH DELICA LANCETS 22W MISC OneTouch Delica Lancets 33 gauge    [provider]  [facility] VERIO test strip USE 1 STRIP TO CHECK GLUCOSE ONCE DAILY 12/26/17   Philemon Kingdom, MD    Family History Family History  Problem Relation Age of Onset  . Diabetes Sister   . Diabetes Brother   . Diabetes Sister   . Diabetes Mother        and on mother's side of family  . Colon cancer Mother   . Stroke Mother   . Diabetes Father        and on father's side of family  . Heart disease Neg Hx   . Rectal cancer Neg Hx   . Stomach cancer Neg Hx     Social History Social  History   Tobacco Use  . Smoking status: Never Smoker  . Smokeless tobacco: Never Used  Substance Use Topics  . Alcohol use: Yes    Alcohol/week: 0.0 standard drinks    Comment: occasional wine cooler  . Drug use: No     Allergies   Penicillins   Review of Systems Review of Systems  Constitutional: Negative for appetite change.  HENT: Negative for congestion.   Respiratory: Negative for shortness of breath.   Cardiovascular: Negative for chest pain.  Gastrointestinal: Positive for abdominal pain, nausea and vomiting.  Genitourinary: Positive for flank pain.  Musculoskeletal: Negative for back pain.  Skin: Negative for rash.  Neurological: Negative for weakness.  Psychiatric/Behavioral: Negative for confusion.      Physical Exam Updated Vital Signs BP (!) 195/81 (BP Location: Left Arm)   Pulse 88   Resp 17   SpO2 97%   Physical Exam  Constitutional: She appears well-developed.  HENT:  Head: Normocephalic.  Eyes: EOM are normal.  Cardiovascular: Regular rhythm.  Pulmonary/Chest: Breath sounds normal.  Abdominal: Normal appearance. No hernia.  Genitourinary:  Genitourinary Comments: Some tenderness left CVA area with Band-Aid over small incision from biopsy.  Neurological: She is alert.  Skin: Skin is warm. Capillary refill takes less than 2 seconds.     ED Treatments / Results  Labs (all labs ordered are listed, but only abnormal results are displayed) Labs Reviewed  URINALYSIS, ROUTINE W REFLEX MICROSCOPIC - Abnormal; Notable for the following components:      Result Value   Color, Urine STRAW (*)    APPearance HAZY (*)    Glucose, UA 150 (*)    Hgb urine dipstick SMALL (*)    Ketones, ur 5 (*)    Protein, ur 100 (*)    Leukocytes, UA TRACE (*)    All other components within normal limits  CBC - Abnormal; Notable for the following components:   WBC 22.0 (*)    RBC 6.38 (*)    Hemoglobin 16.3 (*)    HCT 51.7 (*)    MCH 25.5 (*)    RDW 20.6 (*)    Platelets 1,172 (*)    All other components within normal limits  BASIC METABOLIC PANEL - Abnormal; Notable for the following components:   Glucose, Bld 260 (*)    Creatinine, Ser 1.49 (*)    GFR calc non Af Amer 33 (*)    GFR calc Af Amer 38 (*)    All other components within normal limits  URINALYSIS, MICROSCOPIC (REFLEX)    EKG None  Radiology Ct Abdomen Pelvis W Contrast  Result Date: 06/02/2018 CLINICAL DATA:  Left-sided abdominal/groin pain post renal biopsy earlier today. Abdominal distension post procedure. EXAM: CT ABDOMEN AND PELVIS WITH CONTRAST TECHNIQUE: Multidetector CT imaging of the abdomen and pelvis was performed using the standard protocol following bolus administration of intravenous contrast.  CONTRAST:  66mL ISOVUE-300 IOPAMIDOL (ISOVUE-300) INJECTION 61% COMPARISON:  06/24/2008 and MRI abdomen/kidney 04/26/2017 FINDINGS: Lower chest: Lung bases demonstrate minimal posterior bibasilar dependent atelectasis. Mild calcified plaque over the left anterior descending coronary artery. Hepatobiliary: Gallbladder, liver and biliary tree are normal. Pancreas: Normal. Spleen: Within normal. Adrenals/Urinary Tract: Adrenal glands are normal. Kidneys are normal in size without evidence of nephrolithiasis. Kidneys are well opacified. Mild prominence of the renal pelvis bilaterally left worse than right. There is minimal left perinephric hemorrhage adjacent the lower pole compatible with percutaneous ultrasound-guided biopsy earlier today. This rind of hemorrhage measures 1.6 cm in thickness  at the level of the lower pole on the axial images. No significant subcapsular collection. Ureters and bladder are normal. Stomach/Bowel: Stomach and small bowel are normal. Appendix is normal. There is diverticulosis throughout the colon most notable over the sigmoid colon. Vascular/Lymphatic: Mild calcified plaque over the abdominal aorta. Vascular structures are otherwise unremarkable. No adenopathy. Reproductive: 1.4 cm fat containing mass over the uterine fundus possibly a small lipoma. Ovaries are within normal. Other: Small amount of free fluid in the pelvis. Musculoskeletal: Minimal degenerative change over the lower lumbar spine and hips. IMPRESSION: Small amount of left perinephric hemorrhage adjacent the lower pole compatible with recent ultrasound-guided percutaneous biopsy earlier today. Colonic diverticulosis. Aortic Atherosclerosis (ICD10-I70.0). 1.4 cm fatty nodule over the uterine fundus possibly a lipoma. Subtle atherosclerotic coronary artery disease. Mild bibasilar atelectasis. Electronically Signed   By: Marin Olp M.D.   On: 06/02/2018 20:41   US Biopsy (kidney)  Result Date: 06/02/2018 INDICATION:  Proteinuria of uncertain etiology. Please perform ultrasound-guided random renal biopsy for tissue diagnostic purposes. EXAM: ULTRASOUND GUIDED RENAL BIOPSY COMPARISON:  Abdominal ultrasound - 04/21/2017; abdominal MRI - 04/26/2017 MEDICATIONS: None. ANESTHESIA/SEDATION: Fentanyl 100 mcg IV; Versed 1 mg IV Total Moderate Sedation time: 10 minutes; The patient was continuously monitored during the procedure by the interventional radiology nurse under my direct supervision. COMPLICATIONS: SIR Level A - No therapy, no consequence. Procedure complicated by development of a small asymptomatic left-sided perinephric hematoma. PROCEDURE: Informed written consent was obtained from the patient after a discussion of the risks, benefits and alternatives to treatment. The patient understands and consents the procedure. A timeout was performed prior to the initiation of the procedure. Ultrasound scanning was performed of the bilateral flanks. The inferior pole of the left kidney was selected for biopsy due to location and sonographic window. The procedure was planned. The operative site was prepped and draped in the usual sterile fashion. The overlying soft tissues were anesthetized with 1% lidocaine with epinephrine. A 17 gauge core needle biopsy device was advanced into the inferior cortex of the left kidney and 2 core biopsies were obtained under direct ultrasound guidance. Images were saved for documentation purposes. The biopsy device was removed and hemostasis was obtained with manual compression. Post procedural scanning demonstrated a small asymptomatic left-sided perinephric hematoma which was not found to significantly enlarge subsequent delayed images. A dressing was placed. The patient tolerated the procedure well without immediate post procedural complication. IMPRESSION: Technically successful ultrasound guided left renal biopsy. Electronically Signed   By: Sandi Mariscal M.D.   On: 06/02/2018 11:18     Procedures Procedures (including critical care time)  Medications Ordered in ED Medications  fentaNYL (SUBLIMAZE) injection 50 mcg (50 mcg Intravenous Given 06/02/18 1812)  ondansetron (ZOFRAN) injection 4 mg (4 mg Intravenous Given 06/02/18 1810)  iopamidol (ISOVUE-300) 61 % injection 100 mL (80 mLs Intravenous Contrast Given 06/02/18 2003)     Initial Impression / Assessment and Plan / ED Course  I have reviewed the triage vital signs and the nursing notes.  Pertinent labs & imaging results that were available during my care of the patient were reviewed by me and considered in my medical decision making (see chart for details).     Patient presents with abdominal pain after renal biopsy.  White count elevated but hemoglobin reassuring.  Feels much better after treatment.  CT scan done and discussed with Dr. Pascal Lux, who did the procedure.  Likely self-limited hematoma.  Discharge home with follow-up as needed.  Final Clinical  Impressions(s) / ED Diagnoses   Final diagnoses:  Postoperative hematoma involving genitourinary system following genitourinary procedure    ED Discharge Orders    None       Davonna Belling, MD 06/02/18 2138

## 2018-06-02 NOTE — Progress Notes (Signed)
CRITICAL VALUE ALERT  Critical Value:  plts 945  Date & Time Notied:  06/02/18  Provider Notified: Jannifer Franklin  Orders Received/Actions taken: no new orders

## 2018-06-02 NOTE — H&P (Signed)
Chief Complaint: Patient was seen in consultation today for random renal biopsy  Referring Physician(s): Jannifer Hick A  Supervising Physician: Sandi Mariscal  Patient Status: [facility] - Out-pt  History of Present Illness: Brandi Mccormick is a 77 y.o. female with a past medical history significant for poorly controlled DM, HLD, HTN, thrombocytosis and hyperthyroidism who presents to [facility] today for scheduled random renal biopsy due to proteinuria. Urinalysis performed at most recent PCP office visit on 04/02/18 shows microalbumin 410, previously 271.1 (11/15/16), 95.6 (06/28/16) and 16.7 (02/21/14) - she was referred to nephrology for further evaluation. Nephrology has requested a random renal biopsy in IR to further evaluate proteinuria.  Per chart patient underwent MR abdomen on 04/26/17 for possible right renal mass that was seen on [place] from 04/21/17, however MRI showed no evidence of right renal mass.   Patient denies any complaints today - she is aware of procedure to be performed today and would like to proceed.  Past Medical History:  Diagnosis Date  . Diabetes ([place])   . GERD (gastroesophageal reflux disease)   . Hyperlipidemia   . Hypertension   . Hyperthyroidism   . Pneumonia   . Shingles     Past Surgical History:  Procedure Laterality Date  . COLONOSCOPY    . DENTAL SURGERY      Allergies: Penicillins  Medications: Prior to Admission medications   Medication Sig Start Date End Date Taking? Authorizing Provider  aspirin EC 81 MG tablet Take 81 mg by mouth daily.   Yes [provider]  atorvastatin (LIPITOR) 40 MG tablet TAKE 1 TABLET BY MOUTH ONCE DAILY Patient taking differently: Take 40 mg by mouth daily.  02/16/18  Yes Philemon Kingdom, MD  furosemide (LASIX) 20 MG tablet Take 20 mg by mouth 2 (two) times daily.   Yes [provider]  lisinopril (PRINIVIL,ZESTRIL) 20 MG tablet Take 20 mg by mouth daily.    Yes [provider]  metFORMIN  (GLUCOPHAGE-XR) 750 MG 24 hr tablet Take 1 tablet (750 mg total) by mouth 2 (two) times daily with a meal. Patient taking differently: Take 750 mg by mouth daily with breakfast.  02/02/18  Yes Philemon Kingdom, MD  ranitidine (ZANTAC) 150 MG tablet Take 1 tablet (150 mg total) by mouth 2 (two) times daily. Patient taking differently: Take 150 mg by mouth at bedtime as needed for heartburn.  10/18/14  Yes Lowne Chase, Alferd Apa, DO  ONETOUCH DELICA LANCETS 03K MISC OneTouch Delica Lancets 33 gauge    [provider]  [facility] VERIO test strip USE 1 STRIP TO CHECK GLUCOSE ONCE DAILY 12/26/17   Philemon Kingdom, MD     Family History  Problem Relation Age of Onset  . Diabetes Sister   . Diabetes Brother   . Diabetes Sister   . Diabetes Mother        and on mother's side of family  . Colon cancer Mother   . Stroke Mother   . Diabetes Father        and on father's side of family  . Heart disease Neg Hx   . Rectal cancer Neg Hx   . Stomach cancer Neg Hx     Social History   Socioeconomic History  . Marital status: Married    Spouse name: Not on file  . Number of children: Not on file  . Years of education: Not on file  . Highest education level: Not on file  Occupational History  . Occupation: ABB--  Comment: retired  Scientific laboratory technician  . Financial resource strain: Not on file  . Food insecurity:    Worry: Not on file    Inability: Not on file  . Transportation needs:    Medical: Not on file    Non-medical: Not on file  Tobacco Use  . Smoking status: Never Smoker  . Smokeless tobacco: Never Used  Substance and Sexual Activity  . Alcohol use: Yes    Alcohol/week: 0.0 standard drinks    Comment: occasional wine cooler  . Drug use: No  . Sexual activity: Never    Partners: Male  Lifestyle  . Physical activity:    Days per week: Not on file    Minutes per session: Not on file  . Stress: Not on file  Relationships  . Social connections:    Talks on phone: Not on  file    Gets together: Not on file    Attends religious service: Not on file    Active member of club or organization: Not on file    Attends meetings of clubs or organizations: Not on file    Relationship status: Not on file  Other Topics Concern  . Not on file  Social History Narrative   Exercise-- yard work,  Walking in [place] discussed and pertinent positives are indicated in the HPI above.  All other systems are negative.  Review of Systems  Constitutional: Negative for chills and fever.  Respiratory: Negative for shortness of breath.   Cardiovascular: Negative for chest pain.  Gastrointestinal: Negative for abdominal pain, nausea and vomiting.  Genitourinary: Negative for dysuria and hematuria.  Neurological: Negative for dizziness.  Psychiatric/Behavioral: Negative for confusion.    Vital Signs: Pulse 77   Temp (!) 97.1 F (36.2 C)   Resp 18   Ht 5\' 4"  (1.626 m)   Wt 125 lb (56.7 kg)   SpO2 100%   BMI 21.46 kg/m   Physical Exam  Constitutional: She is oriented to person, place, and time. No distress.  HENT:  Head: Normocephalic.  Cardiovascular: Normal rate, regular rhythm and normal heart sounds.  Pulmonary/Chest: Effort normal and breath sounds normal.  Abdominal: Soft. There is no tenderness.  Neurological: She is alert and oriented to person, place, and time.  Skin: Skin is warm and dry. She is not diaphoretic.  Psychiatric: She has a normal mood and affect. Her behavior is normal. Judgment and thought content normal.  Vitals reviewed.    MD Evaluation Airway: WNL Heart: WNL Abdomen: WNL Chest/ Lungs: WNL ASA  Classification: 2 Mallampati/Airway Score: Two   Imaging: No results found.  Labs:  CBC: No results for input(s): WBC, HGB, HCT, PLT in the last 8760 hours.  COAGS: No results for input(s): INR, APTT in the last 8760 hours.  BMP: Recent Labs    04/02/18 0955  NA 137  K 3.7  CL 103  CO2  26  GLUCOSE 180*  BUN 20  CALCIUM 9.6  CREATININE 0.98    LIVER FUNCTION TESTS: Recent Labs    04/02/18 0955  BILITOT 0.8  AST 28  ALT 22  ALKPHOS 106  PROT 6.9  ALBUMIN 3.3*    TUMOR MARKERS: No results for input(s): AFPTM, CEA, CA199, CHROMGRNA in the last 8760 hours.  Assessment and Plan:  Patient with new onset proteinuria - request for random renal biopsy in IR today. Patient has been reviewed by Dr. Pascal Lux and he is agreeable  to proceed with procedure as planned. Patient has been NPO since last night, she does not take any blood thinning medications, pre-procedure lab work pending at time of this note writing.  Risks and benefits discussed with the patient including, but not limited to bleeding, infection, damage to adjacent structures or low yield requiring additional tests.  All of the patient's questions were answered, patient is agreeable to proceed.  Consent signed and in chart.  Thank you for this interesting consult.  I greatly enjoyed meeting Brandi Mccormick and look forward to participating in their care.  A copy of this report was sent to the requesting provider on this date.  Electronically Signed: Joaquim Nam, PA-C 06/02/2018, 6:56 AM   I spent a total of 15 Minutes   in face to face in clinical consultation, greater than 50% of which was counseling/coordinating care for random renal biopsy.

## 2018-06-04 ENCOUNTER — Ambulatory Visit: Payer: Medicare HMO | Admitting: Internal Medicine

## 2018-06-04 ENCOUNTER — Encounter: Payer: Self-pay | Admitting: Internal Medicine

## 2018-06-04 VITALS — BP 158/92 | HR 86 | Ht 64.0 in | Wt 123.0 lb

## 2018-06-04 DIAGNOSIS — Z8639 Personal history of other endocrine, nutritional and metabolic disease: Secondary | ICD-10-CM

## 2018-06-04 DIAGNOSIS — E119 Type 2 diabetes mellitus without complications: Secondary | ICD-10-CM

## 2018-06-04 DIAGNOSIS — E785 Hyperlipidemia, unspecified: Secondary | ICD-10-CM

## 2018-06-04 LAB — POCT GLYCOSYLATED HEMOGLOBIN (HGB A1C): HEMOGLOBIN A1C: 6.9 % — AB (ref 4.0–5.6)

## 2018-06-04 LAB — PATHOLOGIST SMEAR REVIEW

## 2018-06-04 MED ORDER — METFORMIN HCL ER 750 MG PO TB24: 750.0000 mg | ORAL_TABLET | Freq: Every day | ORAL | 3 refills | Status: DC

## 2018-06-04 NOTE — Patient Instructions (Addendum)
Please continue: - Metformin ER 750 mg but move it with dinner - Glipizide XL 5 mg in a.m. and 2.5 mg before dinner  Please return in 4 months with your sugar log.

## 2018-06-04 NOTE — Addendum Note (Signed)
Addended by: Cardell Peach I on: 06/04/2018 11:29 AM   Modules accepted: Orders

## 2018-06-04 NOTE — Progress Notes (Signed)
Patient ID: Brandi Mccormick, female   DOB: 03-Mar-1941, 77 y.o.   MRN: [id number]  HPI: Brandi Mccormick is a 77 y.o.-year-old female, returning for f/u for DM2, dx 2013, non-insulin-dependent, uncontrolled, with complications (microalbuminuria) and h/o hyperthyroidism. Last visit 4 months ago.  She had worsening proteinuria since last visit >> had a kidney Bx >> results pending.   DM2: Hemoglobin A1c levels: Lab Results  Component Value Date   HGBA1C 7.6 (A) 02/02/2018   HGBA1C 7.4 10/03/2017   HGBA1C 7.0 (H) 04/18/2017   HGBA1C 6.9 02/14/2017   HGBA1C 7.3 11/15/2016   HGBA1C 6.6 06/28/2016   HGBA1C 7.1 03/28/2016   HGBA1C 7.5 12/29/2015   HGBA1C 6.9 08/30/2015   HGBA1C 6.7 05/02/2015   HGBA1C 6.6 (H) 12/27/2014   HGBA1C 6.7 (H) 09/26/2014   HGBA1C 7.2 (H) 05/23/2014   HGBA1C 8.1 (H) 02/17/2014   HGBA1C 11.9 (H) 11/16/2013   Pt is on a regimen of: - Metformin ER 750 mg 2x >> 1x daily with b'fast - Glipizide XL 5 mg in a.m. and 2.5 mg before dinner We tried Tradjenta 5 mg daily in am - too expensive. She was getting diarrhea from Metformin >> now Metformin XR 750 mg daily >> tolerates this well.  Pt checks her sugars - once a day: - am: 103, 130-150 >> 109-152 >> 124-157 - 2h after b'fast: 140 >> 134, 153 >>122 >> 114 - before lunch:  98-134 >> n/c >> 114-145 >> 126, 135 - 2h after lunch:  136 >> 93-145 >> n/c >> n/c - before dinner: 75, 114-154 >> 85, 109-150 >> 70-146 - 2h after dinner:90, 102 >> 145-155 >> 91-157 - bedtime: 103-142 >> 109-141 >> 98-120 - nighttime: n/c >> 161 >> n/c Lowest sugar: 74 >> 85 >> 70; it is unclear at which level she has hypoglycemia awareness Highest sugar:  145 >> 155 >> 157  Meter: OneTouch Verio.  Pt's meals are: - Breakfast: egg, coffee, juice; sometime - Lunch: No Lunch - Dinner: meat + vegetables >> late dinner during the weekend as her son is cooking and brings her food - Snacks: 1-2 Cut down on Pepsi and sweets.  -+ CKD, last  BUN/creatinine:  Lab Results  Component Value Date   BUN 19 06/02/2018   CREATININE 1.49 (H) 06/02/2018   She has microalbuminuria: Worsening: Lab Results  Component Value Date   MICRALBCREAT 422.7 (H) 04/02/2018   MICRALBCREAT 225.8 (H) 11/15/2016   MICRALBCREAT 48.8 (H) 06/28/2016   MICRALBCREAT 9.9 02/21/2014   MICRALBCREAT 8.8 11/16/2013  Previously on irbesartan, stopped due to recall.  Now on amlodipine.  -+ HL; last set of lipids: Lab Results  Component Value Date   CHOL 328 (H) 04/02/2018   HDL 69.50 04/02/2018   LDLCALC 237 (H) 04/02/2018   TRIG 105.0 04/02/2018   CHOLHDL 5 04/02/2018  Prev. on Simvastatin >> off in 06/2016 b/c fatigue.  She started pravastatin 80 mg daily in 12/2016 >> now Lipitor 40 mg daily.  She was on Zetia, now off.  -No numbness and tingling in her feet.  - Last eye exam 12/2017: No DR  She has a h/o hyperthyroidism, previously treated with medication, approximately 20 years ago..  No other history available  Pt denies: - feeling nodules in neck - hoarseness - dysphagia - choking - SOB with lying down  Reviewed her TFTs: Normal since at least 2015: Lab Results  Component Value Date   TSH 1.11 04/02/2018   TSH 1.13 07/03/2017  TSH 1.12 06/28/2016   TSH 1.29 06/23/2015   TSH 1.44 10/18/2014   TSH 0.93 08/15/2014   TSH 0.44 02/17/2014   TSH 0.28 (L) 12/20/2013   FREET4 0.93 07/03/2017   FREET4 0.96 06/28/2016   FREET4 0.91 08/15/2014   FREET4 1.00 02/17/2014   FREET4 0.97 12/20/2013   T3FREE 3.2 07/03/2017   T3FREE 3.1 06/28/2016   T3FREE 3.2 08/15/2014   T3FREE 2.6 02/17/2014   T3FREE 2.9 12/20/2013   ROS: Constitutional: no weight gain/no weight loss, no fatigue, no subjective hyperthermia, no subjective hypothermia Eyes: no blurry vision, no xerophthalmia ENT: no sore throat, + see HPI Cardiovascular: no CP/no SOB/no palpitations/+ leg swelling Respiratory: no cough/no SOB/no wheezing Gastrointestinal: no N/no V/no  D/no C/no acid reflux Musculoskeletal: no muscle aches/no joint aches Skin: no rashes, no hair loss Neurological: no tremors/no numbness/no tingling/no dizziness  I reviewed pt's medications, allergies, PMH, social hx, family hx, and changes were documented in the history of present illness. Otherwise, unchanged from my initial visit note.  Past Medical History:  Diagnosis Date  . Diabetes ([place])   . GERD (gastroesophageal reflux disease)   . Hyperlipidemia   . Hypertension   . Hyperthyroidism   . Pneumonia   . Shingles    Past Surgical History:  Procedure Laterality Date  . COLONOSCOPY    . DENTAL SURGERY     Social History   Socioeconomic History  . Marital status: Married    Spouse name: Not on file  . Number of children: Not on file  . Years of education: Not on file  . Highest education level: Not on file  Occupational History  . 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 Financial resource strain: Not on file  . Food insecurity:    Worry: Not on file    Inability: Not on file  . Transportation needs:    Medical: Not on file    Non-medical: Not on file  Tobacco Use  . Smoking status: Never Smoker  . Smokeless tobacco: Never Used  Substance and Sexual Activity  . Alcohol use: Yes    Alcohol/week: 0.0 standard drinks    Comment: occasional wine cooler  . Drug use: No  . Sexual activity: Never    Partners: Male  Lifestyle  . Physical activity:    Days per week: Not on file    Minutes per session: Not on file  . Stress: Not on file  Relationships  . Social connections:    Talks on phone: Not on file    Gets together: Not on file    Attends religious service: Not on file    Active member of club or organization: Not on file    Attends meetings of clubs or organizations: Not on file    Relationship status: Not on file  . Intimate partner violence:    Fear of current or ex partner: Not on file    Emotionally abused: Not on file    Physically  abused: Not on file    Forced sexual activity: Not on file  Other Topics Concern  . Not on file  Social History Narrative   Exercise-- yard work,  Walking in [place]   Current Outpatient Medications on File Prior to Visit  Medication Sig Dispense Refill  . aspirin EC 81 MG tablet Take 81 mg by mouth daily.    Marland Kitchen atorvastatin (LIPITOR) 40 MG tablet TAKE 1 TABLET BY MOUTH ONCE DAILY (Patient taking differently: Take  40 mg by mouth daily. ) 90 tablet 1  . furosemide (LASIX) 20 MG tablet Take 20 mg by mouth 2 (two) times daily.    Marland Kitchen lisinopril (PRINIVIL,ZESTRIL) 20 MG tablet Take 20 mg by mouth daily.     . metFORMIN (GLUCOPHAGE-XR) 750 MG 24 hr tablet Take 1 tablet (750 mg total) by mouth 2 (two) times daily with a meal. (Patient taking differently: Take 750 mg by mouth daily with breakfast. ) 180 tablet 3  . ONETOUCH DELICA LANCETS 83M MISC OneTouch Delica Lancets 33 gauge    . ONETOUCH VERIO test strip USE 1 STRIP TO CHECK GLUCOSE ONCE DAILY 50 each 7  . ranitidine (ZANTAC) 150 MG tablet Take 1 tablet (150 mg total) by mouth 2 (two) times daily. (Patient taking differently: Take 150 mg by mouth at bedtime as needed for heartburn. ) 60 tablet 1   Current Facility-Administered Medications on File Prior to Visit  Medication Dose Route Frequency Provider Last Rate Last Dose  . 0.9 %  sodium chloride infusion  500 mL Intravenous Continuous Armbruster, Carlota Raspberry, MD       Allergies  Allergen Reactions  . Penicillins Nausea And Vomiting    Has patient had a PCN reaction causing immediate rash, facial/tongue/throat swelling, SOB or lightheadedness with hypotension: no Has patient had a PCN reaction causing severe rash involving mucus membranes or skin necrosis: no Has patient had a PCN reaction that required hospitalization: no Has patient had a PCN reaction occurring within the last 10 years: no If all of the above answers are "NO", then may proceed with Cephalosporin use.    Family History   Problem Relation Age of Onset  . Diabetes Sister   . Diabetes Brother   . Diabetes Sister   . Diabetes Mother        and on mother's side of family  . Colon cancer Mother   . Stroke Mother   . Diabetes Father        and on father's side of family  . Heart disease Neg Hx   . Rectal cancer Neg Hx   . Stomach cancer Neg Hx     PE: BP (!) 158/92   Pulse 86   Ht 5\' 4"  (1.626 m)   Wt 123 lb (55.8 kg)   SpO2 98%   BMI 21.11 kg/m  Body mass index is 21.11 kg/m. Wt Readings from Last 3 Encounters:  06/04/18 123 lb (55.8 kg)  06/02/18 125 lb (56.7 kg)  04/02/18 126 lb (57.2 kg)   Constitutional: Normal weight, in NAD Eyes: PERRLA, EOMI, no exophthalmos ENT: moist mucous membranes, no thyromegaly, no cervical lymphadenopathy Cardiovascular: RRR, No MRG Respiratory: CTA B Gastrointestinal: abdomen soft, NT, ND, BS+ Musculoskeletal: no deformities, strength intact in all 4 Skin: moist, warm, no rashes Neurological: no tremor with outstretched hands, DTR normal in all 4  ASSESSMENT: 1. DM2, non-insulin-dependent, uncontrolled, with complications - MAU  2. H/o Hyperthyroidism - 20 years ago - was on meds, cannot remember name  3. HL  PLAN:  1. Patient with history of uncontrolled diabetes, with improved control in the last 4 years, only on oral antidiabetic regimen.  However, at the last 2 visits, HbA1c started to increase and we ended up increasing her metformin ER.  She is also on glipizide XL.  She could not afford other diabetic medicines in the past.  No low blood sugars. -At last visit, sugars were higher in the morning and we discussed about improving her  dinners (moving them earlier) and reducing snacking after dinner.  We also doubled the dose of metformin ER then. -At this visit, she tells me that she reduce the dose of metformin to just once a day as she felt that twice a day was too much.  Reviewing her kidney test, this does not allow 1500 mg of metformin daily,  but we can continue with the 750 mg once a day.  I did advise her to move it with dinner, though, since sugars in the morning are higher than target.  We will continue the glipizide since she does not experience any lows and sugars later in the day are almost all at goal. - I suggested to:  Patient Instructions  Please continue: - Metformin ER 750 mg but move it with dinner - Glipizide XL 5 mg in a.m. and 2.5 mg before dinner  Please return in 4 months with your sugar log.   - today, HbA1c is 6.9% (better) - continue checking sugars at different times of the day - check 1x a day, rotating checks - advised for yearly eye exams >> she is UTD - refuses flu shot today - Return to clinic in 4 mo with sugar log   2. H/o hyperthyroidism -Appears euthyroid -Reviewed TFTs: TSH normal 04/2018  3. HL - Reviewed latest lipid panel from 04/2018: LDL was extremely high, as was the total cholesterol.  The rest of the fractions were at goal. Lab Results  Component Value Date   CHOL 328 (H) 04/02/2018   HDL 69.50 04/02/2018   LDLCALC 237 (H) 04/02/2018   TRIG 105.0 04/02/2018   CHOLHDL 5 04/02/2018  - Continues Lipitor 40 without side effects.  She was on Zetia, but she is not sure if she needs to continue with this.  We discussed that the LDL can be high due to her proteinuria.  After her kidney investigation is completed, she will probably need to switch to high-dose Crestor +/- Zetia +/- PCSK9 inhibitors  Philemon Kingdom, MD PhD [facility] Endocrinology

## 2018-06-05 ENCOUNTER — Encounter: Payer: Self-pay | Admitting: Hematology and Oncology

## 2018-06-10 ENCOUNTER — Telehealth: Payer: Self-pay | Admitting: *Deleted

## 2018-06-10 DIAGNOSIS — R69 Illness, unspecified: Secondary | ICD-10-CM | POA: Diagnosis not present

## 2018-06-10 NOTE — Telephone Encounter (Signed)
Copied from [place] [id number]. Topic: General - Other >> Jun 09, 2018  9:24 AM Lennox Solders wrote: Reason for CRM: pt is calling and would like to know why she needs to see hematologist. Pt received a letter from hematologist. Pt has an appt on 06/16/18

## 2018-06-11 ENCOUNTER — Encounter (HOSPITAL_COMMUNITY): Payer: Self-pay | Admitting: Internal Medicine

## 2018-06-11 NOTE — Telephone Encounter (Signed)
Do you know anything about this?  It looks like referral was placed in your name and I do not see where you requested a referral.

## 2018-06-12 NOTE — Telephone Encounter (Signed)
[  06/12/2018 1:44 PM]  Kem Boroughs:   i have a patient of mine who is calling about a referral to hematology that was made on 06/01/18 that my provider Roma Schanz is unaware of this order as well as the patient.  patients mrn is [id number].  it was put in by [facility].  I sent message to her but no response.  I saw your name on this request and wanted to know if you knew anything about this?   [06/12/2018 1:47 PM]  Isaiah Blakes:   I had the pt scheduled to see Dr. Audelia Hives on 10/15, but she cld me yesterday to cancel because she wasn't sure why she was being referred and had called your office but hadn't heard anything from your office at the time.    [06/12/2018 1:56 PM]  Kem Boroughs:   Nor my provider or I put in the referral.  I do not even see an encounter on the 06/01/18.  My provider and I are confused.  I spoke with the patient and she stated that she was not even seen in any office on that day.  I am not sure who mechele overton is.  She does not work in this office at all.     [06/12/2018 1:59 PM]  Isaiah Blakes:   Ok let me look into it a little further. I was wondering why she was confused about the referral. Mechele is our HIM specialist who enters referrals from [facility]. It could be that she misread who actually referred the pt. I will let you know.   [06/12/2018 2:01 PM]  Kem Boroughs:   ok great   [06/12/2018 2:02 PM]  Isaiah Blakes:   That was a total mixup. [facility] was the referring office. They don't fill out the referral form to let us know the actual MD's name that is sending the pt. Dr. Nonda Lou name is on the pt's demographic sheet as the referring provider. Sorry about the mixup   We saved this conversation. You'll see it soon in the Conversations tab in [employer] and in the [place] History folder in [place].  [06/12/2018 2:05 PM]  Kem Boroughs:   will you guys call to let the patient know and  maybe she will be seen   [06/12/2018 2:07 PM]  Isaiah Blakes:   I am going to call [facility] and have them speak to the pt and explain the reason for the referral. Thank you for pointing this out to me! Have a great weekend!

## 2018-06-12 NOTE — Telephone Encounter (Signed)
Advised patient not sure of who put in order and that we were trying to figure out where the referral generated from.  Patient stated that she was not in any dr office on 06/01/18.   She was seen at [facility] on 06/02/18 which is documented in the chart.

## 2018-06-12 NOTE — Telephone Encounter (Signed)
Left message on machine that [facility] will be calling her to explain to her about the referral.  Dr. Jyl Heinz, figured it out.

## 2018-06-12 NOTE — Telephone Encounter (Signed)
I did not put it in --- I wonder if the hospital did it because her cbc was so abnormal--- platelets were high

## 2018-06-16 ENCOUNTER — Inpatient Hospital Stay: Payer: Medicare HMO | Admitting: Hematology and Oncology

## 2018-06-17 DIAGNOSIS — R69 Illness, unspecified: Secondary | ICD-10-CM | POA: Diagnosis not present

## 2018-06-19 DIAGNOSIS — N051 Unspecified nephritic syndrome with focal and segmental glomerular lesions: Secondary | ICD-10-CM | POA: Diagnosis not present

## 2018-06-19 DIAGNOSIS — E1129 Type 2 diabetes mellitus with other diabetic kidney complication: Secondary | ICD-10-CM | POA: Diagnosis not present

## 2018-06-19 DIAGNOSIS — E785 Hyperlipidemia, unspecified: Secondary | ICD-10-CM | POA: Diagnosis not present

## 2018-06-19 DIAGNOSIS — D473 Essential (hemorrhagic) thrombocythemia: Secondary | ICD-10-CM | POA: Diagnosis not present

## 2018-06-19 DIAGNOSIS — I129 Hypertensive chronic kidney disease with stage 1 through stage 4 chronic kidney disease, or unspecified chronic kidney disease: Secondary | ICD-10-CM | POA: Diagnosis not present

## 2018-06-22 ENCOUNTER — Telehealth: Payer: Self-pay | Admitting: Hematology and Oncology

## 2018-06-22 NOTE — Telephone Encounter (Signed)
Pt cld to reschedule hematology. Pt has been scheduled to see Dr. Audelia Hives on 10/25 at 11am.

## 2018-06-26 ENCOUNTER — Telehealth: Payer: Self-pay | Admitting: Hematology and Oncology

## 2018-06-26 ENCOUNTER — Telehealth: Payer: Self-pay | Admitting: Emergency Medicine

## 2018-06-26 ENCOUNTER — Inpatient Hospital Stay: Payer: Medicare HMO

## 2018-06-26 ENCOUNTER — Other Ambulatory Visit: Payer: Self-pay

## 2018-06-26 ENCOUNTER — Encounter: Payer: Self-pay | Admitting: Hematology and Oncology

## 2018-06-26 ENCOUNTER — Inpatient Hospital Stay: Payer: Medicare HMO | Attending: Hematology and Oncology | Admitting: Hematology and Oncology

## 2018-06-26 VITALS — BP 200/86 | HR 76 | Temp 97.9°F | Resp 18 | Ht 64.0 in | Wt 124.0 lb

## 2018-06-26 DIAGNOSIS — I10 Essential (primary) hypertension: Secondary | ICD-10-CM

## 2018-06-26 DIAGNOSIS — E0590 Thyrotoxicosis, unspecified without thyrotoxic crisis or storm: Secondary | ICD-10-CM

## 2018-06-26 DIAGNOSIS — D471 Chronic myeloproliferative disease: Secondary | ICD-10-CM

## 2018-06-26 DIAGNOSIS — E119 Type 2 diabetes mellitus without complications: Secondary | ICD-10-CM | POA: Diagnosis not present

## 2018-06-26 DIAGNOSIS — E785 Hyperlipidemia, unspecified: Secondary | ICD-10-CM | POA: Diagnosis not present

## 2018-06-26 DIAGNOSIS — Z7984 Long term (current) use of oral hypoglycemic drugs: Secondary | ICD-10-CM | POA: Diagnosis not present

## 2018-06-26 DIAGNOSIS — Z7982 Long term (current) use of aspirin: Secondary | ICD-10-CM | POA: Diagnosis not present

## 2018-06-26 DIAGNOSIS — Z79899 Other long term (current) drug therapy: Secondary | ICD-10-CM | POA: Insufficient documentation

## 2018-06-26 DIAGNOSIS — D469 Myelodysplastic syndrome, unspecified: Secondary | ICD-10-CM

## 2018-06-26 LAB — CBC WITH DIFFERENTIAL (CANCER CENTER ONLY)
Abs Immature Granulocytes: 0.08 10*3/uL — ABNORMAL HIGH (ref 0.00–0.07)
BASOS ABS: 0.1 10*3/uL (ref 0.0–0.1)
Basophils Relative: 0 %
Eosinophils Absolute: 0.2 10*3/uL (ref 0.0–0.5)
Eosinophils Relative: 2 %
HEMATOCRIT: 51.6 % — AB (ref 36.0–46.0)
HEMOGLOBIN: 16.2 g/dL — AB (ref 12.0–15.0)
IMMATURE GRANULOCYTES: 1 %
LYMPHS ABS: 2.7 10*3/uL (ref 0.7–4.0)
Lymphocytes Relative: 21 %
MCH: 25.2 pg — ABNORMAL LOW (ref 26.0–34.0)
MCHC: 31.4 g/dL (ref 30.0–36.0)
MCV: 80.4 fL (ref 80.0–100.0)
Monocytes Absolute: 1.2 10*3/uL — ABNORMAL HIGH (ref 0.1–1.0)
Monocytes Relative: 9 %
NEUTROS ABS: 8.7 10*3/uL — AB (ref 1.7–7.7)
NEUTROS PCT: 67 %
NRBC: 0 % (ref 0.0–0.2)
Platelet Count: 1286 10*3/uL (ref 150–400)
RBC: 6.42 MIL/uL — AB (ref 3.87–5.11)
RDW: 20.2 % — ABNORMAL HIGH (ref 11.5–15.5)
WBC: 12.9 10*3/uL — AB (ref 4.0–10.5)

## 2018-06-26 LAB — COMPREHENSIVE METABOLIC PANEL
ALBUMIN: 3.1 g/dL — AB (ref 3.5–5.0)
ALK PHOS: 119 U/L (ref 38–126)
ALT: 20 U/L (ref 0–44)
ANION GAP: 11 (ref 5–15)
AST: 24 U/L (ref 15–41)
BILIRUBIN TOTAL: 0.6 mg/dL (ref 0.3–1.2)
BUN: 14 mg/dL (ref 8–23)
CALCIUM: 9.9 mg/dL (ref 8.9–10.3)
CO2: 25 mmol/L (ref 22–32)
Chloride: 107 mmol/L (ref 98–111)
Creatinine, Ser: 0.97 mg/dL (ref 0.44–1.00)
GFR calc Af Amer: >60 mL/min (ref >60)
GFR calc non Af Amer: 55 mL/min — ABNORMAL LOW (ref >60)
GLUCOSE: 96 mg/dL (ref 70–99)
POTASSIUM: 3.6 mmol/L (ref 3.5–5.1)
Sodium: 143 mmol/L (ref 135–145)
TOTAL PROTEIN: 7.6 g/dL (ref 6.5–8.1)

## 2018-06-26 LAB — TSH: TSH: 0.635 u[IU]/mL (ref 0.308–3.960)

## 2018-06-26 LAB — FERRITIN: Ferritin: 22 ng/mL (ref 11–307)

## 2018-06-26 MED ORDER — HYDROXYUREA 500 MG PO CAPS: ORAL_CAPSULE | ORAL | 2 refills | Status: AC

## 2018-06-26 NOTE — Telephone Encounter (Addendum)
Faxed 10/25 visit info with MD Audelia Hives to DO Cheri Rous and MD Cruzita Lederer.  Despite multiple attempts was unable to fax, received BUSY and NO ANSWER messages from both.

## 2018-06-26 NOTE — Telephone Encounter (Signed)
Returning pt's VM asking if she should take one or two doses of her new medication from MD [place] today.  Advised her to take one and start on two a day tomorrow.  Verbalized understanding.

## 2018-06-26 NOTE — Telephone Encounter (Signed)
Called pt's cell phone, no answer.  Called home phone, spoke with husband.  Advised him for pt to call MD Audelia Hives back to go over lab results.  Verbalized understanding.

## 2018-06-26 NOTE — Progress Notes (Signed)
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  On October 1, she underwent a kidney biopsy under the direction of Dr. Sandi Mariscal at [facility].  Her primary care physician is Dr. Roma Schanz.  She is also followed by Dr. Philemon Kingdom, endocrinology.  She is alone at this first visit.  Brandi Mccormick was seen previously at the [facility] office by Laverna Peace, nurse practitioner at the [facility] on April 10, 2017.  Her platelet count had been elevated since 2015.  Both her hemoglobin and white blood cell count were also mildly elevated.  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  Ironically, Brandi Mccormick does not remember that initial consultation and follow-up or subsequent telephone conversation with [facility].   On April 29, 2017 the results of her lab work including mutation studies were discussed over the telephone.  Although  phlebotomy therapy was recommended, it never transpired.  By her report, discussed also was the risk of thrombosis (arterial or venous) and possibly bleeding tendency.  It was recommended at that time that she begin phlebotomy therapy to lower her red blood cell volume.  She declined in favor of getting her personal and financial affairs in order.  There was unfortunately no subsequent follow-up.  At the time she was relatively asymptomatic.  She has no coronary artery disease, angina, or myocardial infarction.  She denies seizure disorder or stroke syndrome.  She has no peptic ulcer disease.  She denies viral hepatitis, inflammatory bowel disease, or symptomatic diverticulosis.  Her most recent colonoscopy was reportedly 3 years earlier.  She denies degenerative joint disease.  She has no rheumatoid or gouty arthritis.  There is no known blood disorder in the immediate family.  She has no peripheral arterial venous thromboembolic disease.  Clinically both her appetite and weight remain stable.  She has no unusual headache, dizziness, lightheadedness, syncope, or near syncopal episodes.  She has no dental pain. She denies visual changes or hearing deficit.  She has no rash or itching.  She has no epistaxis or hemoptysis.  There is no unusual cough, sore throat, or orthopnea.  She has no dyspnea either at rest or on minimal exertion.  There is no pain or difficulty in swallowing.  No fever, shaking chills, sweats, or flulike symptoms are evident.  She denies heartburn or indigestion.    No nausea, vomiting, diarrhea, constipation are reported.  She denies melena or bright red blood per rectum.  She has no urinary urgency, hematuria, or dysuria.  For the past 3 weeks she has had urinary frequency.  She is undergone a recent kidney biopsy as outlined above.  There is no swelling of her ankles at present.  She denies focal areas of bone, joint, muscle pain.  She has no numbness or tingling in the fingers or toes.   She denies bleeding tendency or easy bruisability.  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  Past Medical History:  Diagnosis Date  . Diabetes ([place])   . GERD (gastroesophageal reflux disease)   . Hyperlipidemia   . Hypertension   . Hyperthyroidism   . Pneumonia   . Shingles    Past Surgical History:  Procedure Laterality Date  . COLONOSCOPY    . DENTAL SURGERY    Kidney biopsy June 02, 2018  Gynecologic History: Her menarche was at age 50 years. Her last menstruation was in her 64s She is a gravida 2 para 2. Her first pregnancy was at age 58 years. Her last pregnancy was at age 66 years. She has never been on oral contraception. Her last mammogram was reportedly 2 years earlier. She has never had a breast biopsy. Her last Pap smear was many years earlier.  Family History  Problem Relation Age of Onset  . Diabetes Sister   . Diabetes Brother   . Diabetes Sister   . Diabetes Mother        and on mother's side of family  . Colon cancer Mother   . Stroke Mother   . Diabetes Father        and on father's side of family  . Heart disease Neg Hx   . Rectal cancer Neg Hx   . Stomach cancer Neg Hx   Mother: Deceased: 55s: CVA/colon cancer. Father: Deceased: 27s: COPD. Brothers (2): Her brother at the age of 39 years has a kidney transplant.                       Her second brother also has "kidney problems." Sisters (2): Both sisters are living with diabetes mellitus.  Social History   Socioeconomic History  . Marital status: Married    Spouse name: Not on file  . Number of children: Not on file  . Years of education: Not on file  . Highest education level: Not on file  Occupational History  . 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 Financial  resource strain: Not on file  . Food insecurity:    Worry: Not on file    Inability: Not on file  . Transportation needs:    Medical: Not on file    Non-medical: Not on file  Tobacco Use  . Smoking status: Never Smoker  . Smokeless tobacco: Never Used  Substance and Sexual Activity  . Alcohol use: Yes    Alcohol/week: 0.0 standard drinks    Comment: occasional wine cooler  . Drug use: No  . Sexual activity: Never    Partners: Male  Lifestyle  . Physical activity:    Days per week: Not on file    Minutes per session: Not on file  . Stress: Not on file  Relationships  . Social connections:    Talks on phone: Not on file    Gets together: Not on file    Attends religious service: Not on file    Active member of club or organization: Not on file    Attends meetings of clubs or organizations: Not on file  Relationship status: Not on file  . Intimate partner violence:    Fear of current or ex partner: Not on file    Emotionally abused: Not on file    Physically abused: Not on file    Forced sexual activity: Not on file  Other Topics Concern  . Not on file  Social History Narrative   Exercise-- yard work,  Walking in [employer] is married for the past 55 years. She has 2 children without major medical problems both sons. She is a lifetime non-smoker. Her alcohol intake consists of monthly wine cooler. She reports no recreational drug use.  Transfusion History: No prior transfusion  Exposure History: She has no known exposure to toxic chemicals, radiation, or pesticides.  Allergies:  Allergies  Allergen Reactions  . Penicillins Nausea And Vomiting    Has patient had a PCN reaction causing immediate rash, facial/tongue/throat swelling, SOB or lightheadedness with hypotension: no Has patient had a PCN reaction causing severe rash involving mucus membranes or skin necrosis: no Has patient had a PCN reaction that required hospitalization: no Has patient had a PCN  reaction occurring within the last 10 years: no If all of the above answers are "NO", then may proceed with Cephalosporin use.   She has no food or seasonal allergies.  Current Outpatient Medications on File Prior to Visit  Medication Sig  . aspirin EC 81 MG tablet Take 81 mg by mouth daily.  Marland Kitchen atorvastatin (LIPITOR) 40 MG tablet TAKE 1 TABLET BY MOUTH ONCE DAILY (Patient taking differently: Take 40 mg by mouth daily. )  . furosemide (LASIX) 20 MG tablet Take 20 mg by mouth 2 (two) times daily.  Marland Kitchen lisinopril (PRINIVIL,ZESTRIL) 20 MG tablet Take 20 mg by mouth daily.   . metFORMIN (GLUCOPHAGE-XR) 750 MG 24 hr tablet Take 1 tablet (750 mg total) by mouth daily with supper.  Glory Rosebush DELICA LANCETS 34L MISC OneTouch Delica Lancets 33 gauge  . ONETOUCH VERIO test strip USE 1 STRIP TO CHECK GLUCOSE ONCE DAILY  . ranitidine (ZANTAC) 150 MG tablet Take 1 tablet (150 mg total) by mouth 2 (two) times daily. (Patient taking differently: Take 150 mg by mouth at bedtime as needed for heartburn. )   Current Facility-Administered Medications on File Prior to Visit  Medication  . 0.9 %  sodium chloride infusion    Review of Systems: Constitutional: No fever, sweats, or shaking chills.  No appetite or weight deficit. Skin: No rash, scaling, sores, lumps, or jaundice. HEENT: No visual changes or hearing deficit. Pulmonary: No unusual cough, sore throat, or orthopnea; no sinusitis or rhinitis Breasts: No complaints. Cardiovascular: No coronary artery disease, angina, or myocardial infarction.  No cardiac dysrhythmia, essential hypertension and dyslipidemia. Gastrointestinal: No indigestion, dysphagia, abdominal pain, diarrhea, or constipation.  No change in bowel habits.  No nausea or vomiting.  No melena or bright red blood per rectum. Genitourinary: No urinary urgency, hematuria, or dysuria.  Urinary frequency over the past 3 weeks; status post kidney biopsy; focal/segmental  glomerulosclerosis. Musculoskeletal: No arthralgias or myalgias; no joint swelling, pain, or instability. Hematologic: No bleeding tendency or easy bruisability; polycythemia vera rubra. Endocrine: No intolerance to heat or cold; hyperthyroid disease and non-insulin requiring diabetes mellitus. Vascular: No peripheral arterial or venous thromboembolic disease. Psychological: No anxiety, depression, or mood changes; no mental health illnesses. Neurological: No dizziness, lightheadedness, syncope, or near syncopal episodes; no numbness or tingling in the fingers or toes.  Physical Examination: Vital Signs: Body surface area is  1.59 meters squared.  Vitals:   06/26/18 1055  BP: (!) 211/97  Pulse: 76  Resp: 18  Temp: 97.9 F (36.6 C)  SpO2: 99%    Filed Weights   06/26/18 1055  Weight: 124 lb (56.2 kg)  ECOG PERFORMANCE STATUS: 1 Constitutional:  Brandi Mccormick is a fully nourished and developed African-American.  He looks age appropriate.  He is friendly and cooperative without respiratory compromise at rest. Skin: No rashes, scaling, dryness, jaundice, or itching. HEENT: Head is normocephalic and atraumatic.  Pupils are equal round and reactive to light and accommodation.  Extraocular muscles are intact.  Sclerae are anicteric.  Conjunctivae are pink.  No sinus tenderness nor oropharyngeal lesions. No nystagmus is present.  Lips without cracking or peeling; tongue without mass, inflammation, or nodularity.  Mucous membranes are moist. Neck: Supple and symmetric.  No jugular venous distention or thyromegaly.  Trachea is midline. Lymphatics: No cervical or supraclavicular lymphadenopathy.  No epitrochlear, axillary, or inguinal lymphadenopathy is appreciated. Respiratory/chest: Thorax is symmetrical.  Breath sounds are clear to auscultation and percussion.  Normal excursion and respiratory effort. Back: Symmetric without deformity or tenderness. Cardiovascular: Heart rate and rhythm are  regular without murmurs. Gastrointestinal: Abdomen is soft, nontender; no organomegaly.  Bowel sounds are normoactive.  No masses are appreciated. Rectal examination: Not performed. Extremities: In the lower extremities, there is no asymmetric swelling, erythema, tenderness, or cord formation.  No clubbing, cyanosis, nor edema. Hematologic: No petechiae, hematomas, or ecchymoses. Psychological:  She is oriented to person, place, and time; normal affect; poor memory. Neurological: There are no gross neurologic deficits.  Laboratory Results: I have reviewed the data as listed: June 26, 2018  Ref Range & Units 12:59 (06/26/18) 3wk ago (06/02/18) 3wk ago (06/02/18) 58yr ago (04/10/17) 3yr ago (03/21/17)  WBC Count 4.0 - 10.5 K/uL 12.9High   22.0High   10.7High   10.5High  R 10.5   RBC 3.87 - 5.11 MIL/uL 6.42High   6.38High   5.77High   6.25High  R 6.15High  R  Hemoglobin 12.0 - 15.0 g/dL 16.2High   16.3High   14.6  16.7High  R 16.2High    HCT 36.0 - 46.0 % 51.6High   51.7High   47.0High   48.6High  R 50.3High    MCV 80.0 - 100.0 fL 80.4  81.0 R 81.5 R 78Low  R 81.7 R  MCH 26.0 - 34.0 pg 25.2Low   25.5Low   25.3Low   26.7    MCHC 30.0 - 36.0 g/dL 31.4  31.5  31.1  34.4 R 32.2   RDW 11.5 - 15.5 % 20.2High   20.6High   20.3High   19.8High  R 19.5High    Platelet Count 150 - 400 K/uL 1,286High Panic   1,172High Panic  CM 945High Panic  CM 807High  R 871.0High  R  Comment: This critical result has verified and been called to Brandi Mccormick Im by Orvis Brill on 10 25 2019 at 1324, and has been read back.   nRBC 0.0 - 0.2 % 0.0       Neutrophils Relative % % 67     68.8 R  Neutro Abs 1.7 - 7.7 K/uL 8.7High     7.4High  R 7.2 R  Lymphocytes Relative % 21     20.4 R  Lymphs Abs 0.7 - 4.0 K/uL 2.7     2.1   Monocytes Relative % 9     8.5 R  Monocytes Absolute 0.1 - 1.0 K/uL 1.2High  0.9   Eosinophils Relative % 2     1.7 R  Eosinophils Absolute 0.0 - 0.5 K/uL 0.2    0.2 R 0.2 R  Basophils Relative %  0     0.6 R  Basophils Absolute 0.0 - 0.1 K/uL 0.1     0.1   Immature Granulocytes % 1       Abs Immature Granulocytes 0.00 - 0.07 K/uL 0.40JWJX        Comment: Performed at [facility], [place] [address], [place], [place] [address]  LYMPH#     1.9    MONO#     0.9    BASO#     0.1    NEUT%     70.8    LYMPH%     18.4    MONO%     8.7    EOS%     1.6    BASO%     0.5         CMP Latest Ref Rng & Units 06/02/2018 04/02/2018 04/18/2017  Glucose 70 - 99 mg/dL 260(H) 180(H) 141(H)  BUN 8 - 23 mg/dL 19 20 17   Creatinine 0.44 - 1.00 mg/dL 1.49(H) 0.98 0.80  Sodium 135 - 145 mmol/L 136 137 139  Potassium 3.5 - 5.1 mmol/L 3.6 3.7 3.7  Chloride 98 - 111 mmol/L 103 103 105  CO2 22 - 32 mmol/L 22 26 26   Calcium 8.9 - 10.3 mg/dL 9.1 9.6 9.4  Total Protein 6.0 - 8.3 g/dL - 6.9 6.6  Total Bilirubin 0.2 - 1.2 mg/dL - 0.8 0.7  Alkaline Phos 39 - 117 U/L - 106 81  AST 0 - 37 U/L - 28 27  ALT 0 - 35 U/L - 22 25   Diagnostic/Imaging Studies: CT ABDOMEN AND PELVIS WITH CONTRAST  TECHNIQUE: Multidetector CT imaging of the abdomen and pelvis was performed using the standard protocol following bolus administration of intravenous contrast.  CONTRAST:  38mL ISOVUE-300 IOPAMIDOL (ISOVUE-300) INJECTION 61%  COMPARISON:  06/24/2008 and MRI abdomen/kidney 04/26/2017  FINDINGS: Lower chest: Lung bases demonstrate minimal posterior bibasilar dependent atelectasis. Mild calcified plaque over the left anterior descending coronary artery.  Hepatobiliary: Gallbladder, liver and biliary tree are normal.  Pancreas: Normal.  Spleen: Within normal.  Adrenals/Urinary Tract: Adrenal glands are normal. Kidneys are normal in size without evidence of nephrolithiasis. Kidneys are well opacified. Mild prominence of the renal pelvis bilaterally left worse than right. There is minimal left perinephric hemorrhage adjacent the lower pole compatible with  percutaneous ultrasound-guided biopsy earlier today. This rind of hemorrhage measures 1.6 cm in thickness at the level of the lower pole on the axial images. No significant subcapsular collection. Ureters and bladder are normal.  Stomach/Bowel: Stomach and small bowel are normal. Appendix is normal. There is diverticulosis throughout the colon most notable over the sigmoid colon.  Vascular/Lymphatic: Mild calcified plaque over the abdominal aorta. Vascular structures are otherwise unremarkable. No adenopathy.  Reproductive: 1.4 cm fat containing mass over the uterine fundus possibly a small lipoma. Ovaries are within normal.  Other: Small amount of free fluid in the pelvis.  Musculoskeletal: Minimal degenerative change over the lower lumbar spine and hips.  IMPRESSION: Small amount of left perinephric hemorrhage adjacent the lower pole compatible with recent ultrasound-guided percutaneous biopsy earlier today.  Colonic diverticulosis.  Aortic Atherosclerosis (ICD10-I70.0).  1.4 cm fatty nodule over the uterine fundus possibly a lipoma.  Subtle atherosclerotic coronary artery disease. Mild bibasilar atelectasis.  Marin Olp M.D.  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  Brandi Mccormick was seen previously at the [facility] office by Laverna Peace, nurse practitioner at the [facility] on April 10, 2017.  Her platelet count had been elevated since 2015.  Both her hemoglobin and white blood cell count were also mildly elevated.  On April 10, 2017: A complete blood count showed hemoglobin 16.7 hematocrit 48.6 WBC 10.5; platelets 807,000.  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 At the time she was relatively asymptomatic.  Ironically, Brandi Mccormick does not remember that initial consultation, follow-up or subsequent telephone conversation with [facility]. On April 29, 2017 the results of her lab work including mutation studies were discussed over the telephone by Judson Roch.  Although phlebotomy therapy was recommended, it never transpired.  By her report, she discussed also the risk of thrombosis (arterial or venous) in the form of a stroke, myocardial infarction, or renal ischemia.  On occasion there exists a bleeding tendency.  It was recommended at that time that she begin phlebotomy therapy to lower her red blood cell volume.  She declined in favor of getting her personal and financial affairs in order.  There was unfortunately no subsequent follow-up.    She has no coronary artery disease, angina, or myocardial infarction.  She denies seizure disorder or stroke syndrome.  She has no peptic ulcer disease.  She denies viral hepatitis, inflammatory bowel disease, or symptomatic diverticulosis.  Her most recent colonoscopy was reportedly 3 years earlier.  She denies degenerative joint disease.  She has no rheumatoid or gouty arthritis.  There is no known blood disorder in the immediate family.  She has no peripheral arterial venous thromboembolic disease.  Clinically both her appetite and weight remain stable.  She has no unusual headache, dizziness, lightheadedness, syncope, or near syncopal episodes.  She has no dental pain. She denies visual changes or hearing deficit.  She has no rash or itching.  She has no epistaxis or hemoptysis.  There is no unusual cough, sore throat, or orthopnea.  She has no dyspnea either at rest or on minimal exertion.  There is no pain or difficulty in swallowing.  No fever, shaking chills, sweats, or flulike symptoms are evident.  She denies heartburn or indigestion.    No  nausea, vomiting, diarrhea, constipation are reported.  She denies melena or bright red blood per rectum.  She has no urinary urgency, hematuria, or dysuria.  For the past 3 weeks she has had urinary frequency.  She has undergone a recent kidney biopsy as outlined above.  There is no swelling of her ankles at present.  She denies focal areas of bone, joint, muscle pain.  She has no numbness or tingling in the fingers or toes.  She denies bleeding tendency or easy bruisability.  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    Recommendation/Plan: I discussed in detail the previous work-up and evaluation from August, 2018.  I discussed and personally reviewed the gene mutation study revealing a JAK 2 V617F gene mutation.  Copies were given for her review.  The results of her laboratory studies from today were  not available at the time of discharge.  Those results are detailed above.  Additionally since patients with myeloproliferative disease can have an acquired von Willebrand disease, predisposing them to bleeding, a von Willebrand panel was also requested.    We reviewed in detail the role, rationale, expectation, and potential side effect profile of hydroxyurea.  Those side effects include, but are not exclusive of, myelosuppression and the risk of serious even life-threatening infectious and/or bleeding complications, nausea, mucositis, fever, and rash.  In general, however, hydroxyurea is well-tolerated..  Literature was given to review the potential side effect profile of hydroxyurea.  We discussed in detail the natural history and behavior of polycythemia vera.  Uncommonly, this myeloproliferative disease can transform into either myelodysplasia or acute myeloid  leukemia.  We discussed also once again the potential complications associated with elevated red blood cell volume, and especially platelets.  In her age group generally, the risk of thrombosis is greater than the risk of bleeding.  Because her platelet count is over 1.2 million, and her hematocrit is 52%, her risk is high.  At first, she declined a prescription for hydroxyurea.  She was later called by me, and informed of the results of her complete blood count.  It was emphasized the importance of getting started on treatment immediately.  She verbalized understanding and agreed to start.  A prescription for hydroxyurea: 500 mg twice daily was sent to her pharmacy.  She agreed to start treatment today.  Because of her renal disease and elevated creatinine, she was encouraged to drink at least 1-1/2 quarts of water daily.  Barring any unforeseen complications, her next scheduled doctor visit with laboratory studies is on July 08, 2018 with recommendations to follow.  She was advised to call us in the interim should any new or untoward problems arise.    The total time spent discussing the natural history and behavior of myeloproliferative disorder; role, rationale, and potential side effect profile of hydroxyurea; laboratory studies from today with recommendations was 60 minutes.  At least 50% of that time was spent in face-to-face discussion, reviewing outside records, laboratory evaluation, counseling, and answering questions. All questions were answered to her satisfaction.  This note was dictated using voice activated technology/software.  Unfortunately, typographical errors are not uncommon, and transcription is subject to mistakes and regrettably misinterpretation.  If necessary, clarification of the above information can be discussed with me at any time.  Thank you Dr. Cheri Rous for allowing my participation in the care of Brandi Mccormick.  Please do not hesitate to call should any questions  arise regarding this initial consultation and discussion.  FOLLOW UP: AS DIRECTED   cc:      Garnet Koyanagi Chase DO            Philemon Kingdom, MD   Henreitta Leber, MD  Hematology/Oncology [facility] [address] [place], [place] [address] Office: [phone] BJYN: [phone]

## 2018-06-26 NOTE — Patient Instructions (Signed)
We discussed in detail the results of your prior gene mutation testing from April 16, 2017.  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  Your last platelet count was 807,000.  Normal is between 150-400,000.  We discussed in detail the risk involved with a high platelet count which includes both arterial and venous thromboembolic disease and on occasion bleeding tendency.  Repeat laboratory studies were obtained today.  Those results will be discussed in detail the time of your next visit.  Literature was given regarding the role, rationale, and potential side effect profile of hydroxyurea.  Hydroxyurea is generally well-tolerated.  Uncommonly, it can cause nausea, rash, and visual changes.  These are uncommon and dose dependent.  Instead of starting on hydroxyurea today, you chose to discuss this in detail with your husband Laverna Peace who unfortunately is not present.  If he has questions, he is welcome to come to your next visit.  You were given a copy of your gene mutation testing.    Your sons, if not already tested, should discuss testing with their primary care physician.  Barring any unforeseen complications, a follow-up appointment has been scheduled on November 6 to discuss the use of hydroxyurea in the setting of a chronic myeloproliferative disease as discussed previously.  Please do not hesitate to call in the interim if any new or untoward problems arise.  Thank you! Ladona Ridgel, MD Hematology/Oncology

## 2018-06-26 NOTE — Telephone Encounter (Signed)
Appts scheduled avs/calendar printed per 10/25 los

## 2018-06-28 LAB — VON WILLEBRAND PANEL
COAGULATION FACTOR VIII: 176 % — AB (ref 56–140)
RISTOCETIN CO-FACTOR, PLASMA: 54 % (ref 50–200)
VON WILLEBRAND ANTIGEN, PLASMA: 192 % (ref 50–200)

## 2018-06-28 LAB — COAG STUDIES INTERP REPORT

## 2018-06-29 ENCOUNTER — Telehealth: Payer: Self-pay

## 2018-06-29 NOTE — Telephone Encounter (Signed)
Faxed OV from Dr. Audelia Hives to Dr. Philemon Kingdom [phone] and Lyndal Pulley, DO [phone]. Confirmed fax receipt 06/29/18 at 1211.

## 2018-07-08 ENCOUNTER — Ambulatory Visit: Payer: Medicare HMO | Admitting: Medical

## 2018-07-08 ENCOUNTER — Emergency Department (HOSPITAL_COMMUNITY)
Admission: EM | Admit: 2018-07-08 | Discharge: 2018-07-08 | Disposition: A | Discharge status: Home / Self Care | Payer: Medicare HMO | Attending: Emergency Medicine | Admitting: Emergency Medicine

## 2018-07-08 ENCOUNTER — Inpatient Hospital Stay: Payer: Medicare HMO

## 2018-07-08 ENCOUNTER — Telehealth: Payer: Self-pay | Admitting: Hematology and Oncology

## 2018-07-08 ENCOUNTER — Encounter: Payer: Self-pay | Admitting: Hematology and Oncology

## 2018-07-08 ENCOUNTER — Encounter (HOSPITAL_COMMUNITY): Payer: Self-pay | Admitting: *Deleted

## 2018-07-08 ENCOUNTER — Inpatient Hospital Stay: Payer: Medicare HMO | Attending: Hematology and Oncology | Admitting: Hematology and Oncology

## 2018-07-08 VITALS — BP 204/64 | HR 79 | Temp 97.9°F | Resp 18 | Ht 64.0 in | Wt 123.0 lb

## 2018-07-08 DIAGNOSIS — Z7984 Long term (current) use of oral hypoglycemic drugs: Secondary | ICD-10-CM | POA: Insufficient documentation

## 2018-07-08 DIAGNOSIS — K219 Gastro-esophageal reflux disease without esophagitis: Secondary | ICD-10-CM | POA: Diagnosis not present

## 2018-07-08 DIAGNOSIS — Z7982 Long term (current) use of aspirin: Secondary | ICD-10-CM

## 2018-07-08 DIAGNOSIS — Z79899 Other long term (current) drug therapy: Secondary | ICD-10-CM | POA: Insufficient documentation

## 2018-07-08 DIAGNOSIS — K5730 Diverticulosis of large intestine without perforation or abscess without bleeding: Secondary | ICD-10-CM

## 2018-07-08 DIAGNOSIS — I10 Essential (primary) hypertension: Secondary | ICD-10-CM

## 2018-07-08 DIAGNOSIS — D471 Chronic myeloproliferative disease: Secondary | ICD-10-CM

## 2018-07-08 DIAGNOSIS — E119 Type 2 diabetes mellitus without complications: Secondary | ICD-10-CM | POA: Insufficient documentation

## 2018-07-08 DIAGNOSIS — I2510 Atherosclerotic heart disease of native coronary artery without angina pectoris: Secondary | ICD-10-CM

## 2018-07-08 DIAGNOSIS — R030 Elevated blood-pressure reading, without diagnosis of hypertension: Secondary | ICD-10-CM

## 2018-07-08 DIAGNOSIS — I700 Atherosclerosis of aorta: Secondary | ICD-10-CM

## 2018-07-08 DIAGNOSIS — D469 Myelodysplastic syndrome, unspecified: Secondary | ICD-10-CM | POA: Diagnosis not present

## 2018-07-08 LAB — CBC WITH DIFFERENTIAL (CANCER CENTER ONLY)
ABS IMMATURE GRANULOCYTES: 0 10*3/uL (ref 0.00–0.07)
BASOS ABS: 0 10*3/uL (ref 0.0–0.1)
Basophils Relative: 1 %
Eosinophils Absolute: 0.1 10*3/uL (ref 0.0–0.5)
Eosinophils Relative: 2 %
HEMATOCRIT: 49.8 % — AB (ref 36.0–46.0)
HEMOGLOBIN: 15.8 g/dL — AB (ref 12.0–15.0)
IMMATURE GRANULOCYTES: 0 %
LYMPHS ABS: 1.6 10*3/uL (ref 0.7–4.0)
LYMPHS PCT: 41 %
MCH: 25.5 pg — ABNORMAL LOW (ref 26.0–34.0)
MCHC: 31.7 g/dL (ref 30.0–36.0)
MCV: 80.3 fL (ref 80.0–100.0)
Monocytes Absolute: 0.3 10*3/uL (ref 0.1–1.0)
Monocytes Relative: 7 %
NEUTROS ABS: 2 10*3/uL (ref 1.7–7.7)
NRBC: 0 % (ref 0.0–0.2)
Neutrophils Relative %: 49 %
Platelet Count: 669 10*3/uL — ABNORMAL HIGH (ref 150–400)
RBC: 6.2 MIL/uL — ABNORMAL HIGH (ref 3.87–5.11)
RDW: 20.9 % — ABNORMAL HIGH (ref 11.5–15.5)
WBC Count: 4 10*3/uL (ref 4.0–10.5)

## 2018-07-08 NOTE — Progress Notes (Signed)
The patient presented to see Dr. 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 She was found to have a blood pressure of 247/100. She was asymtomatic. Given her malignant hypertension and thrombocytosis of >600,000 she was transported to the ER for management. She reports that her BP is always elevated here and at her PCP's. Her husband checks her BP at home. She does not know the results of these checks however.

## 2018-07-08 NOTE — ED Provider Notes (Signed)
  Face-to-face evaluation   History: Patient is here for evaluation of elevated blood pressure, found during routine screening, while she was at the oncology center today.  She denies headache, chest pain, shortness of breath, dizziness or paresthesia.  Physical exam: Elderly, alert and cooperative.  She is lucid.  There is no dysarthria or aphasia.   Medical screening examination/treatment/procedure(s) were conducted as a shared visit with non-physician practitioner(s) and myself.  I personally evaluated the patient during the encounter    Daleen Bo, MD 07/08/18 2133

## 2018-07-08 NOTE — ED Notes (Signed)
Patient given discharge teaching and verbalized understanding. Patient ambulated out of ED with a steady gait. 

## 2018-07-08 NOTE — ED Triage Notes (Signed)
Pt was having blood drawn at cancer center and was told to go to ED for elevated BP. Pt had BP of 204/64 at cancer center. Pt denies headache, dizziness, blurred vision.

## 2018-07-08 NOTE — Patient Instructions (Signed)
We discussed in detail the results of your laboratory studies from today.  Those results suggest marked improvement.  Your platelet count today was 669,000.  Previously it was well over 1.2 million.  You are advised to continue hydroxyurea 1 capsule 2 times daily.  Barring any unforeseen complications, your next scheduled laboratory studies and doctor's visit is on 08-05-18.  Please do not hesitate to call should any new or untoward problems arise in the interim.  Thank you! Ladona Ridgel, MD Hematology/Oncology

## 2018-07-08 NOTE — ED Provider Notes (Addendum)
[facility] DEPT Provider Note   CSN: [id number] Arrival date & time: 07/08/18  1318     History   Chief Complaint Chief Complaint  Patient presents with  . Hypertension    HPI Brandi Mccormick is a 77 y.o. female.  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 Patient had blood work this morning at the Cancer center and when they took her blood pressure the reading were in the 981 systolic and 80 diastolic. Patient reports taking her lasix and lisinopril this morning. She states her pressure is elevated every time she goes into the doctors office as she does not like going for checkups. Patient denies any blurry vision, headache, chest pain or shortness of breath.      Past Medical History:  Diagnosis Date  . Diabetes ([place])   . GERD (gastroesophageal reflux disease)   . Hyperlipidemia   . Hypertension   . Hyperthyroidism   . Pneumonia   . Shingles     Patient Active Problem List   Diagnosis Date Noted  . Hyperlipidemia associated with type 2 diabetes mellitus ([place]) 04/02/2018  . 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 Pneumonia   . Essential hypertension 01/14/2017  . Hyperlipidemia LDL goal <70 11/15/2016  . Polycythemia vera ([place]) 07/01/2014  . Protein-calorie malnutrition, severe ([place]) 06/25/2014  . Protein-calorie malnutrition ([place]) 06/24/2014  . Hypokalemia 06/24/2014  . CAP (community acquired pneumonia) 06/24/2014  . H/O hyperthyroidism 12/21/2013  . Diabetes mellitus type 2, noninsulin dependent ([place]) 11/16/2013    Past Surgical History:  Procedure Laterality Date  . COLONOSCOPY    . DENTAL SURGERY       OB History   None      Home Medications    Prior to Admission medications   Medication Sig Start Date End Date Taking? Authorizing Provider  aspirin EC 81 MG tablet Take 81 mg by mouth daily.   Yes [provider]  atorvastatin (LIPITOR) 40  MG tablet TAKE 1 TABLET BY MOUTH ONCE DAILY Patient taking differently: Take 40 mg by mouth daily.  02/16/18  Yes Philemon Kingdom, MD  furosemide (LASIX) 20 MG tablet Take 20 mg by mouth 2 (two) times daily.   Yes [provider]  hydroxyurea (HYDREA) 500 MG capsule Take 1 capsule 2 times daily.  May take with food to minimize GI side effects. 06/26/18 09/26/18 Yes Henreitta Leber, MD  lisinopril (PRINIVIL,ZESTRIL) 20 MG tablet Take 20 mg by mouth daily.    Yes [provider]  metFORMIN (GLUCOPHAGE-XR) 750 MG 24 hr tablet Take 1 tablet (750 mg total) by mouth daily with supper. 06/04/18  Yes Philemon Kingdom, MD  ranitidine (ZANTAC) 150 MG tablet Take 1 tablet (150 mg total) by mouth 2 (two) times daily. Patient taking differently: Take 150 mg by mouth daily.  10/18/14  Yes Lowne Chase, Alferd Apa, DO  ONETOUCH DELICA LANCETS 19J MISC OneTouch Delica Lancets 33 gauge    [provider]  [facility] VERIO test strip USE 1 STRIP TO CHECK GLUCOSE ONCE DAILY 12/26/17   Philemon Kingdom, MD    Family History Family History  Problem Relation Age of Onset  . Diabetes Sister   . Diabetes Brother   . Diabetes Sister   . Diabetes Mother        and on mother's side of family  . Colon cancer Mother   . Stroke Mother   .  Diabetes Father        and on father's side of family  . Heart disease Neg Hx   . Rectal cancer Neg Hx   . Stomach cancer Neg Hx     Social History Social History   Tobacco Use  . Smoking status: Never Smoker  . Smokeless tobacco: Never Used  Substance Use Topics  . Alcohol use: Yes    Alcohol/week: 0.0 standard drinks    Comment: occasional wine cooler  . Drug use: No     Allergies   Penicillins   Review of Systems Review of Systems  Constitutional: Negative for chills and fever.  HENT: Negative for ear pain and sore throat.   Eyes: Negative for pain and visual disturbance.  Respiratory: Negative for cough and shortness of breath.     Cardiovascular: Negative for chest pain and palpitations.  Gastrointestinal: Negative for abdominal pain and vomiting.  Genitourinary: Negative for dysuria and hematuria.  Musculoskeletal: Negative for arthralgias and back pain.  Skin: Negative for color change and rash.  Neurological: Negative for seizures and syncope.  All other systems reviewed and are negative.    Physical Exam Updated Vital Signs BP (!) 197/83 (BP Location: Left Arm)   Pulse 78   Temp 97.7 F (36.5 C) (Oral)   Resp 16   Ht 5\' 4"  (1.626 m)   Wt 55.8 kg   SpO2 98%   BMI 21.11 kg/m   Physical Exam  Constitutional: She is oriented to person, place, and time. She appears well-developed and well-nourished. No distress.  HENT:  Head: Normocephalic and atraumatic.  Mouth/Throat: Oropharynx is clear and moist. No oropharyngeal exudate.  Eyes: Pupils are equal, round, and reactive to light.  Neck: Normal range of motion.  Cardiovascular: Regular rhythm and normal heart sounds.  Pulmonary/Chest: Effort normal and breath sounds normal. No respiratory distress.  Abdominal: Soft. Bowel sounds are normal. She exhibits no distension. There is no tenderness.  Musculoskeletal: She exhibits no tenderness or deformity.       Right lower leg: She exhibits no edema.       Left lower leg: She exhibits no edema.  Neurological: She is alert and oriented to person, place, and time.  Alert, oriented, thought content appropriate. Speech fluent without evidence of aphasia. Able to follow 2 step commands without difficulty.  Cranial Nerves:  II:  Peripheral visual fields grossly normal, pupils, round, reactive to light III,IV, VI: ptosis not present, extra-ocular motions intact bilaterally  V,VII: smile symmetric, facial light touch sensation equal VIII: hearing grossly normal bilaterally  IX,X: midline uvula rise  XI: bilateral shoulder shrug equal and strong XII: midline tongue extension  Motor:  5/5 in upper and lower  extremities bilaterally including strong and equal grip strength and dorsiflexion/plantar flexion Sensory: light touch normal in all extremities.  Cerebellar: normal finger-to-nose with bilateral upper extremities, pronator drift negative Gait: normal gait and balance   Skin: Skin is warm and dry.  Psychiatric: She has a normal mood and affect.  Nursing note and vitals reviewed.    ED Treatments / Results  Labs (all labs ordered are listed, but only abnormal results are displayed) Labs Reviewed - No data to display  EKG None  Radiology No results found.  Procedures Procedures (including critical care time)  Medications Ordered in ED Medications - No data to display   Initial Impression / Assessment and Plan / ED Course  I have reviewed the triage vital signs and the nursing notes.  Pertinent  labs & imaging results that were available during my care of the patient were reviewed by me and considered in my medical decision making (see chart for details).    Presents with a chief complaint of asymptomatic hypertension.  We will getting her blood drawn at the cancer center patient had 3 different readings of elevated blood pressure.  She is asymptomatic on arrival, blood pressure on arrival was 195/86 with a heart rate of 86 satting at 99%.  Reports her pressure is usually elevated when she goes to Dr. visit that she does not like attending them.  Denies any chest pain, shortness of breath, blurry vision, back pain or headache.  I have personally reviewed patient's records and see patient has a previous visits with her pressure ranging in the 102V systolic according to an appointment 12 days ago.I have discussed this patient with Dr. Eulis Foster who has also seen this patient, she is symptomatic.  Her vitals are stable.  Will recheck her blood pressure prior to discharge.  She is advised to follow-up with her primary care physician as needed.  Final Clinical Impressions(s) / ED Diagnoses     Final diagnoses:  Elevated blood pressure reading    ED Discharge Orders    None       Janeece Fitting, PA-C 07/08/18 1437    Janeece Fitting, PA-C 07/08/18 1535    Daleen Bo, MD 07/08/18 2133

## 2018-07-08 NOTE — Discharge Instructions (Addendum)
Please follow up with your Primary care physician as needed.   Make sure that you stay on a low-salt diet.  See the attached resource guide regarding appropriate foods to eat.

## 2018-07-08 NOTE — Progress Notes (Signed)
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  On October 1, she underwent a kidney biopsy under the direction of Dr. Sandi Mariscal at [facility].  Her primary care physician is Dr. Roma Schanz.  She is also followed by Dr. Philemon Kingdom, endocrinology.  She is alone at this visit.  [place]igh[place] was seen previously at the [facility] office by Laverna Peace, nurse practitioner at the [facility] on April 10, 2017.  Her platelet count had been elevated since 2015. Both her hemoglobin and white blood cell count were also mildly elevated.  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  Ironically, Brandi Mccormick does not remember that initial consultation and follow-up or subsequent telephone conversation with [facility].   On April 29, 2017 the results of her lab work including mutation studies were discussed over the telephone.  Although phlebotomy therapy was recommended, it never transpired.  By  her report, discussed also was the risk of thrombosis (arterial or venous) and possibly bleeding tendency.  It was recommended at that time that she begin phlebotomy therapy to lower her red blood cell volume.  She declined in favor of getting her personal and financial affairs in order. There was unfortunately no subsequent follow-up.  At the time she was relatively asymptomatic.  At the time of her initial visit, we discussed in detail the previous work-up and evaluation from August, 2018.  I discussed and personally reviewed the gene mutation study revealing a JAK 2 V617F gene mutation. Copies were given for her review.  Additionally since individuals with myeloproliferative disease can have an acquired von Willebrand disease, predisposing them to bleeding, a von Willebrand panel was also requested.    We reviewed in detail the role, rationale, expectation, and potential side effect profile of hydroxyurea.  Those side effects include, but are not exclusive of, myelosuppression and the risk of serious even life-threatening infectious and/or bleeding complications, nausea, mucositis, fever, and rash. In general, however, hydroxyurea is well-tolerated. Literature was given to review the potential side effect profile of hydroxyurea.  We discussed in detail the natural history and behavior of polycythemia vera. Uncommonly, this myeloproliferative disease can transform into either myelodysplasia or acute myeloid leukemia.  We discussed also once again the potential complications associated with elevated red blood cell volume, and especially platelets.  In her age group generally, the risk of thrombosis is greater than the risk of bleeding.  Because her platelet count was over 1.2 million, and her hematocrit was 52%, her risk was high. At first, she declined a prescription for hydroxyurea. She later called me, and was informed of the results of her complete blood count.  It was emphasized the importance of  getting started on  treatment immediately.  She verbalized understanding and agreed to start.  A prescription for hydroxyurea: 500 mg twice daily was sent to her pharmacy. She started treatment at once.  Because of her renal disease and elevated creatinine, she was encouraged to drink at least 1-1/2 quarts of water daily. She has had no ill effects from hydroxyurea.  It is with this background she presents now for laboratory studies and reevaluation, now on hydroxyurea: 500 mg twice daily, in the setting of JAK 2 V617F gene mutation consistent with an underlying myeloproliferative disease, polycythemia vera rubra as outlined above.  Interval History: In the interim since her last visit, there are no new problems or complaints. Both her appetite and weight remain stable.  She has no unusual headache, dizziness, lightheadedness, syncope, or near syncopal episodes.  She has no dental pain. She denies visual changes or hearing deficit. She has no rash or itching.  She has no epistaxis or hemoptysis.  There is no unusual cough, sore throat, or orthopnea.  She has no dyspnea either at rest or on minimal exertion.  There is no pain or difficulty in swallowing.  No fever, shaking chills, sweats, or flulike symptoms are evident. She denies heartburn or indigestion.    Where she is no nausea, vomiting, diarrhea, constipation are reported.  She denies melena or bright red blood per rectum.  She has no urinary urgency, hematuria, or dysuria. For the past 3 weeks she has had urinary frequency.  She is undergone a recent kidney biopsy as outlined above. There is no swelling of her ankles at present.  She denies focal areas of bone, joint, muscle pain.  She has no numbness or tingling in the fingers or toes.  She denies bleeding tendency or easy bruisability.    Past Medical History Reviewed        Family History Reviewed       Social History Reviewed  Past Medical History:  Diagnosis Date  . Diabetes ([place])   . GERD  (gastroesophageal reflux disease)   . Hyperlipidemia   . Hypertension   . Hyperthyroidism   . Pneumonia   . Shingles    Allergies  Allergen Reactions  . Penicillins Nausea And Vomiting    Has patient had a PCN reaction causing immediate rash, facial/tongue/throat swelling, SOB or lightheadedness with hypotension: no Has patient had a PCN reaction causing severe rash involving mucus membranes or skin necrosis: no Has patient had a PCN reaction that required hospitalization: no Has patient had a PCN reaction occurring within the last 10 years: no If all of the above answers are "NO", then may proceed with Cephalosporin use.    Current Outpatient Medications on File Prior to Visit  Medication Sig  . aspirin EC 81 MG tablet Take 81 mg by mouth daily.  Marland Kitchen atorvastatin (LIPITOR) 40 MG tablet TAKE 1 TABLET BY MOUTH ONCE DAILY (Patient taking differently: Take 40 mg by mouth daily. )  . furosemide (LASIX) 20 MG tablet Take 20 mg by mouth 2 (two) times daily.  . hydroxyurea (HYDREA) 500 MG capsule Take 1 capsule 2 times daily.  May take with food to minimize GI side effects.  Marland Kitchen lisinopril (PRINIVIL,ZESTRIL) 20 MG tablet Take 20 mg by mouth daily.   . metFORMIN (GLUCOPHAGE-XR) 750 MG 24 hr tablet Take 1 tablet (750 mg total) by mouth daily with supper.  Glory Rosebush DELICA LANCETS 42A MISC OneTouch Delica Lancets 33 gauge  . ONETOUCH VERIO test strip USE 1 STRIP TO CHECK  GLUCOSE ONCE DAILY  . ranitidine (ZANTAC) 150 MG tablet Take 1 tablet (150 mg total) by mouth 2 (two) times daily. (Patient taking differently: Take 150 mg by mouth daily. )   Current Facility-Administered Medications on File Prior to Visit  Medication  . 0.9 %  sodium chloride infusion    Review of Systems: Constitutional: No fever, sweats, or shaking chills.  No appetite or weight deficit. Skin: No rash, scaling, sores, lumps, or jaundice. HEENT: No visual changes or hearing deficit. Pulmonary: No unusual cough, sore  throat, or orthopnea; no sinusitis or rhinitis Breasts: No complaints. Cardiovascular: No coronary artery disease, angina, or myocardial infarction.  No cardiac dysrhythmia, essential hypertension and dyslipidemia. Gastrointestinal: No indigestion, dysphagia, abdominal pain, diarrhea, or constipation.  No change in bowel habits.  No nausea or vomiting.  No melena or bright red blood per rectum. Genitourinary: No urinary urgency, hematuria, or dysuria.  Urinary frequency over the past 3 weeks; status post kidney biopsy; focal/segmental glomerulosclerosis. Musculoskeletal: No arthralgias or myalgias; no joint swelling, pain, or instability. Hematologic: No bleeding tendency or easy bruisability; polycythemia vera rubra. Endocrine: No intolerance to heat or cold; hyperthyroid disease and non-insulin requiring diabetes mellitus. Vascular: No peripheral arterial or venous thromboembolic disease. Psychological: No anxiety, depression, or mood changes; no mental health illnesses. Neurological: No dizziness, lightheadedness, syncope, or near syncopal episodes; no numbness or tingling in the fingers or toes.  Physical Examination: Vital Signs: Body surface area is 1.59 meters squared.  Vitals:   07/08/18 1104  BP: (!) 204/64  Pulse: 79  Resp: 18  Temp: 97.9 F (36.6 C)  SpO2: 100%    Filed Weights   07/08/18 1104  Weight: 123 lb (55.8 kg)  Body mass index is 21.11 kg/m. ECOG PERFORMANCE STATUS: 1 Constitutional:  Brandi Mccormick is a fully nourished and developed African-American.  He looks age appropriate.  He is friendly and cooperative without respiratory compromise at rest. Skin: No rashes, scaling, dryness, jaundice, or itching. HEENT: Head is normocephalic and atraumatic.  Pupils are equal round and reactive to light and accommodation.  Extraocular muscles are intact.  Sclerae are anicteric.  Conjunctivae are pink.  No sinus tenderness nor oropharyngeal lesions. No nystagmus is present.   Lips without cracking or peeling; tongue without mass, inflammation, or nodularity.  Mucous membranes are moist. Neck: Supple and symmetric.  No jugular venous distention or thyromegaly.  Trachea is midline. Lymphatics: No cervical or supraclavicular lymphadenopathy.  No epitrochlear, axillary, or inguinal lymphadenopathy is appreciated. Respiratory/chest: Thorax is symmetrical.  Breath sounds are clear to auscultation and percussion.  Normal excursion and respiratory effort. Back: Symmetric without deformity or tenderness. Cardiovascular: Heart rate and rhythm are regular without murmurs. Gastrointestinal: Abdomen is soft, nontender; no organomegaly.  Bowel sounds are normoactive.  No masses are appreciated. Extremities: In the lower extremities, there is no asymmetric swelling, erythema, tenderness, or cord formation.  No clubbing, cyanosis, nor edema. Hematologic: No petechiae, hematomas, or ecchymoses. Psychological:  She is oriented to person, place, and time; normal affect; poor memory. Neurological: There are no gross neurologic deficits.  Laboratory Results: I have reviewed the data as listed: July 08, 2018  Ref Range & Units 11:25  WBC Count 4.0 - 10.5 K/uL 4.0   RBC 3.87 - 5.11 MIL/uL 6.20High    Hemoglobin 12.0 - 15.0 g/dL 15.8High    HCT 36.0 - 46.0 % 49.8High    MCV 80.0 - 100.0 fL 80.3   MCH 26.0 - 34.0 pg 25.5Low  MCHC 30.0 - 36.0 g/dL 31.7   RDW 11.5 - 15.5 % 20.9High    Platelet Count 150 - 400 K/uL 669High    nRBC 0.0 - 0.2 % 0.0   Neutrophils Relative % % 49   Neutro Abs 1.7 - 7.7 K/uL 2.0   Lymphocytes Relative % 41   Lymphs Abs 0.7 - 4.0 K/uL 1.6   Monocytes Relative % 7   Monocytes Absolute 0.1 - 1.0 K/uL 0.3   Eosinophils Relative % 2   Eosinophils Absolute 0.0 - 0.5 K/uL 0.1   Basophils Relative % 1   Basophils Absolute 0.0 - 0.1 K/uL 0.0   Immature Granulocytes % 0   Abs Immature Granulocytes 0.00 - 0.07 K/uL 0.00    June 26, 2018  Ref Range &  Units 12:59 (06/26/18) 3wk ago (06/02/18) 3wk ago (06/02/18) 78yr ago (04/10/17) 54yr ago (03/21/17)  WBC Count 4.0 - 10.5 K/uL 12.9High   22.0High   10.7High   10.5High  R 10.5   RBC 3.87 - 5.11 MIL/uL 6.42High   6.38High   5.77High   6.25High  R 6.15High  R  Hemoglobin 12.0 - 15.0 g/dL 16.2High   16.3High   14.6  16.7High  R 16.2High    HCT 36.0 - 46.0 % 51.6High   51.7High   47.0High   48.6High  R 50.3High    MCV 80.0 - 100.0 fL 80.4  81.0 R 81.5 R 78Low  R 81.7 R  MCH 26.0 - 34.0 pg 25.2Low   25.5Low   25.3Low   26.7    MCHC 30.0 - 36.0 g/dL 31.4  31.5  31.1  34.4 R 32.2   RDW 11.5 - 15.5 % 20.2High   20.6High   20.3High   19.8High  R 19.5High    Platelet Count 150 - 400 K/uL 1,286High Panic   1,172High Panic  CM 945High Panic  CM 807High  R 871.0High  R  Comment: This critical result has verified and been called to Ma Munoz Im by Orvis Brill on 10 25 2019 at 1324, and has been read back.   nRBC 0.0 - 0.2 % 0.0       Neutrophils Relative % % 67     68.8 R  Neutro Abs 1.7 - 7.7 K/uL 8.7High     7.4High  R 7.2 R  Lymphocytes Relative % 21     20.4 R  Lymphs Abs 0.7 - 4.0 K/uL 2.7     2.1   Monocytes Relative % 9     8.5 R  Monocytes Absolute 0.1 - 1.0 K/uL 1.2High      0.9   Eosinophils Relative % 2     1.7 R  Eosinophils Absolute 0.0 - 0.5 K/uL 0.2    0.2 R 0.2 R  Basophils Relative % 0     0.6 R  Basophils Absolute 0.0 - 0.1 K/uL 0.1     0.1   Immature Granulocytes % 1       Abs Immature Granulocytes 0.00 - 0.07 K/uL 0.97QBHA        Comment: Performed at [facility], [place] [address] [place], [place] [address]  LYMPH#     1.9    MONO#     0.9    BASO#     0.1    NEUT%     70.8    LYMPH%     18.4    MONO%     8.7    EOS%  1.6    BASO%     0.5          Ref Range & Units 12d ago (06/26/18) 70mo ago (06/02/18) 7mo ago (04/02/18) 60yr ago (04/18/17) 85yr ago (04/10/17)  Sodium  135 - 145 mmol/L 143  136  137 R 139 R 138 R  Potassium 3.5 - 5.1 mmol/L 3.6  3.6  3.7 R 3.7 R 3.9 R  Chloride 98 - 111 mmol/L 107  103  103 R 105 R 110High  R  CO2 22 - 32 mmol/L 25  22  26  R 26 R 29 R  Glucose, Bld 70 - 99 mg/dL 96  260High   180High   141High   135High  R  BUN 8 - 23 mg/dL 14  19  20  R 17 R 12 R  Creatinine, Ser 0.44 - 1.00 mg/dL 0.97  1.49High   0.98 R 0.80 R 0.9 R  Calcium 8.9 - 10.3 mg/dL 9.9  9.1  9.6 R 9.4 R 9.3 R  Total Protein 6.5 - 8.1 g/dL 7.6   6.9 R 6.6 R 7.4 R  Albumin 3.5 - 5.0 g/dL 3.1Low    3.3Low  R 3.5 R 3.4 R  AST 15 - 41 U/L 24   28 R 27 R 31 R  ALT 0 - 44 U/L 20   22 R 25 R 25 R  Alkaline Phosphatase 38 - 126 U/L 119   106 R 81 R 100High  R  Total Bilirubin 0.3 - 1.2 mg/dL 0.6   0.8 R 0.7 R 0.90 R  GFR calc non Af Amer >60 mL/min 55Low   33Low       GFR calc Af Amer >60 mL/min >60  38Low       Von Willebrand Factor Assessment:  These results are not consistent with a diagnosis of VWD   according to the current NHLBI guideline.  Ferritin 22   TSH 0.635  Diagnostic/Imaging Studies: CT ABDOMEN AND PELVIS WITH CONTRAST  TECHNIQUE: Multidetector CT imaging of the abdomen and pelvis was performed using the standard protocol following bolus administration of intravenous contrast.  CONTRAST: 2mL ISOVUE-300 IOPAMIDOL (ISOVUE-300) INJECTION 61%  COMPARISON: 06/24/2008 and MRI abdomen/kidney 04/26/2017  FINDINGS: Lower chest: Lung bases demonstrate minimal posterior bibasilar dependent atelectasis. Mild calcified plaque over the left anterior descending coronary artery.  Hepatobiliary: Gallbladder, liver and biliary tree are normal.  Pancreas: Normal.  Spleen: Within normal.  Adrenals/Urinary Tract: Adrenal glands are normal. Kidneys are normal in size without evidence of nephrolithiasis. Kidneys are well opacified. Mild prominence of the renal pelvis bilaterally left worse than right. There is minimal left perinephric  hemorrhage adjacent the lower pole compatible with percutaneous ultrasound-guided biopsy earlier today. This rind of hemorrhage measures 1.6 cm in thickness at the level of the lower pole on the axial images. No significant subcapsular collection. Ureters and bladder are normal.  Stomach/Bowel: Stomach and small bowel are normal. Appendix is normal. There is diverticulosis throughout the colon most notable over the sigmoid colon.  Vascular/Lymphatic: Mild calcified plaque over the abdominal aorta. Vascular structures are otherwise unremarkable. No adenopathy.  Reproductive: 1.4 cm fat containing mass over the uterine fundus possibly a small lipoma. Ovaries are within normal.  Other: Small amount of free fluid in the pelvis.  Musculoskeletal: Minimal degenerative change over the lower lumbar spine and hips.  IMPRESSION: Small amount of left perinephric hemorrhage adjacent the lower pole compatible with recent ultrasound-guided percutaneous biopsy earlier today.  Colonic diverticulosis.  Aortic Atherosclerosis (ICD10-I70.0).  1.4 cm fatty nodule over the uterine fundus possibly a lipoma.  Subtle atherosclerotic coronary artery disease. Mild bibasilar atelectasis.  Marin Olp M.D. 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  Brandi Mccormick was seen previously at the [facility] office by Laverna Peace, nurse practitioner at the [facility] on April 10, 2017.  Her platelet count had been elevated since 2015. Both her hemoglobin and white blood cell count were also mildly elevated.  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  Ironically, Brandi Mccormick does not remember that initial consultation and follow-up or subsequent telephone conversation with [facility].   On April 29, 2017 the results of her lab work including mutation studies were discussed over the telephone.  Although phlebotomy therapy was recommended, it never transpired.  By her report, discussed also was the risk of thrombosis (arterial or venous) and possibly bleeding tendency.  It was recommended at that time that she begin phlebotomy therapy to lower her red blood cell volume.  She declined in favor of getting her personal and financial affairs in order. There was unfortunately no subsequent follow-up.  At the time she was relatively asymptomatic.  At the time of her initial visit, we discussed in detail the previous work-up and evaluation from August, 2018.  I discussed and personally reviewed the gene mutation study revealing a JAK 2 V617F gene mutation. Copies were given for her review.  Additionally since individuals with myeloproliferative disease can have an acquired von Willebrand disease, predisposing them to bleeding, a von Willebrand panel was also requested.    We reviewed in detail the role, rationale, expectation, and potential side effect profile of hydroxyurea.  Those side effects include, but are not exclusive of, myelosuppression and the risk of serious even life-threatening infectious and/or bleeding complications, nausea, mucositis, fever, and rash. In general, however, hydroxyurea is well-tolerated. Literature was given to review the potential side effect profile of hydroxyurea.  We discussed in detail the natural history and behavior of polycythemia vera. Uncommonly, this myeloproliferative disease can transform into either myelodysplasia or acute myeloid leukemia.  We discussed also once again the potential complications associated with elevated red blood cell volume, and especially platelets.  In her age group generally, the  risk of thrombosis is greater than the risk of bleeding.  Because her platelet count was over 1.2 million, and her hematocrit was 52%, her risk was high. At first, she declined a prescription for hydroxyurea. She later called me, and was informed of the results of her complete blood count.  It was emphasized the importance of getting started on treatment immediately.  She verbalized understanding and agreed to start.  A prescription for hydroxyurea: 500 mg twice daily was sent to her pharmacy. She started treatment at once.  Because of her renal disease and elevated creatinine, she was encouraged to drink at least 1-1/2 quarts of water daily. She has had no ill effects from hydroxyurea.    In the interim since her last visit, there are no new problems or complaints. Both her appetite and weight remain stable.  She has no unusual headache, dizziness, lightheadedness, syncope, or near syncopal episodes.  She has no dental pain. She denies visual changes or hearing deficit. She has  no rash or itching.  She has no epistaxis or hemoptysis.  There is no unusual cough, sore throat, or orthopnea.  She has no dyspnea either at rest or on minimal exertion.  There is no pain or difficulty in swallowing.  No fever, shaking chills, sweats, or flulike symptoms are evident. She denies heartburn or indigestion.    Where she is no nausea, vomiting, diarrhea, constipation are reported.  She denies melena or bright red blood per rectum.  She has no urinary urgency, hematuria, or dysuria.  For the past 3 weeks she has had urinary frequency.  She is undergone a recent kidney biopsy as outlined above. There is no swelling of her ankles at present.  She denies focal areas of bone, joint, muscle pain.  She has no numbness or tingling in the fingers or toes. She denies bleeding tendency or easy bruisability.    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  On October 1, she underwent a kidney biopsy under the direction of Dr. Sandi Mariscal at [facility].    Recommendation/Plan: Brandi Mccormick has had a favorable response to hydroxyurea.  Unfortunately, she did not take her blood pressure medications this morning.  Even after taking her blood pressure medication, her systolic blood pressure was 200 mmHg.  For that reason she was sent to the emergency department with "malignant hypertension."  She was seen by our triage physician assistant, Sandi Mealy.  In a wheelchair she was escorted to the emergency department for further evaluation and treatment.  They were called in advance.  Prior to her transfer, however, we discussed in detail the results of her laboratory studies from today.  There has been a significant decrease in both red blood cell volume and platelets.  It was recommended that she continue hydroxyurea: 500 mg twice daily.  Barring any unforeseen complications, her next scheduled doctor visit with laboratory studies is on November 22.  She was advised to call us in the interim should any new or untoward problems arise.  The total time spent discussing the results of her laboratory studies, intolerance to hydroxyurea, abnormal blood pressure and importance of emergent management, and recommendations with regard to continuing hydroxyurea was 25 minutes. At least 50% of that time was spent in face-to-face discussion, counseling, and answering questions.  This note was dictated using voice activated technology/software.  Unfortunately, typographical errors are not uncommon, and transcription is subject to mistakes and regrettably misinterpretation.  If necessary, clarification of the above information can be discussed with me at any time.  FOLLOW UP: AS DIRECTED    cc:       Garnet Koyanagi Chase DO            Philemon Kingdom, MD   Henreitta Leber, MD  Hematology/Oncology [place] [address] [place], [place] [address] Office: [phone] KWIO: [phone]

## 2018-07-08 NOTE — Telephone Encounter (Signed)
No 1/16 los.  

## 2018-07-09 ENCOUNTER — Telehealth: Payer: Self-pay | Admitting: Family Medicine

## 2018-07-09 NOTE — Telephone Encounter (Signed)
Can she come in for ov tomorrow--?  At least nurse visit for bp check.

## 2018-07-09 NOTE — Telephone Encounter (Signed)
Received fax from [employer] phone stating patient needs her BP medication called in. Please call patient once medicine is called in.

## 2018-07-09 NOTE — Telephone Encounter (Signed)
Patient stated that she is taking lisinopril 20mg .  She seems like she is a little confused about her medications.   Her blood pressure this morning was 147/81.  She stated "should I take the lisinopril twice a day"?  I advised on let see what Dr. Etter Sjogren would like to do.  Can you please review chart from yesterday visits and see what she needs to do.  She was not told by [facility] to stop medication.

## 2018-07-10 NOTE — Telephone Encounter (Signed)
Left message with husband for her to schedule appt for recheck bp

## 2018-07-13 ENCOUNTER — Telehealth: Payer: Self-pay | Admitting: Hematology and Oncology

## 2018-07-13 NOTE — Telephone Encounter (Signed)
Called patient back regarding date/time of appt with Dr. Audelia Hives; 11/22 @ 3:30 pm.

## 2018-07-14 ENCOUNTER — Ambulatory Visit (INDEPENDENT_AMBULATORY_CARE_PROVIDER_SITE_OTHER): Payer: Medicare HMO | Admitting: Family Medicine

## 2018-07-14 ENCOUNTER — Encounter: Payer: Self-pay | Admitting: Family Medicine

## 2018-07-14 VITALS — BP 174/66 | HR 83 | Temp 98.0°F | Resp 16 | Ht 64.0 in | Wt 125.4 lb

## 2018-07-14 DIAGNOSIS — I10 Essential (primary) hypertension: Secondary | ICD-10-CM | POA: Diagnosis not present

## 2018-07-14 MED ORDER — LISINOPRIL 40 MG PO TABS: 40.0000 mg | ORAL_TABLET | Freq: Every day | ORAL | 1 refills | Status: DC

## 2018-07-14 NOTE — Telephone Encounter (Signed)
Pt. Has appointment with Dr. Etter Sjogren today.

## 2018-07-14 NOTE — Progress Notes (Signed)
Patient ID: Brandi Mccormick, female    DOB: February 09, 1941  Age: 77 y.o. MRN: [id number]    Subjective:  Subjective  HPI Brandi Mccormick presents for bp f/u.  No complaints    Review of Systems  Constitutional: Negative for appetite change, diaphoresis, fatigue and unexpected weight change.  Eyes: Negative for pain, redness and visual disturbance.  Respiratory: Negative for cough, chest tightness, shortness of breath and wheezing.   Cardiovascular: Negative for chest pain, palpitations and leg swelling.  Endocrine: Negative for cold intolerance, heat intolerance, polydipsia, polyphagia and polyuria.  Genitourinary: Negative for difficulty urinating, dysuria and frequency.  Neurological: Negative for dizziness, light-headedness, numbness and headaches.    History Past Medical History:  Diagnosis Date  . Diabetes ([place])   . GERD (gastroesophageal reflux disease)   . Hyperlipidemia   . Hypertension   . Hyperthyroidism   . Pneumonia   . Shingles     She has a past surgical history that includes Dental surgery and Colonoscopy.   Her family history includes Colon cancer in her mother; Diabetes in her brother, father, mother, sister, and sister; Stroke in her mother.She reports that she has never smoked. She has never used smokeless tobacco. She reports that she drinks alcohol. She reports that she does not use drugs.  Current Outpatient Medications on File Prior to Visit  Medication Sig Dispense Refill  . aspirin EC 81 MG tablet Take 81 mg by mouth daily.    Marland Kitchen atorvastatin (LIPITOR) 40 MG tablet TAKE 1 TABLET BY MOUTH ONCE DAILY (Patient taking differently: Take 40 mg by mouth daily. ) 90 tablet 1  . furosemide (LASIX) 20 MG tablet Take 20 mg by mouth 2 (two) times daily.    . hydroxyurea (HYDREA) 500 MG capsule Take 1 capsule 2 times daily.  May take with food to minimize GI side effects. 60 capsule 2  . metFORMIN (GLUCOPHAGE-XR) 750 MG 24 hr tablet Take 1 tablet (750 mg total) by  mouth daily with supper. 90 tablet 3  . ONETOUCH DELICA LANCETS 44I MISC OneTouch Delica Lancets 33 gauge    . ONETOUCH VERIO test strip USE 1 STRIP TO CHECK GLUCOSE ONCE DAILY 50 each 7  . ranitidine (ZANTAC) 150 MG tablet Take 1 tablet (150 mg total) by mouth 2 (two) times daily. (Patient taking differently: Take 150 mg by mouth daily. ) 60 tablet 1   Current Facility-Administered Medications on File Prior to Visit  Medication Dose Route Frequency Provider Last Rate Last Dose  . 0.9 %  sodium chloride infusion  500 mL Intravenous Continuous Armbruster, Carlota Raspberry, MD         Objective:  Objective  Physical Exam  Constitutional: She is oriented to person, place, and time. She appears well-developed and well-nourished.  HENT:  Head: Normocephalic and atraumatic.  Eyes: Conjunctivae and EOM are normal.  Neck: Normal range of motion. Neck supple. No JVD present. Carotid bruit is not present. No thyromegaly present.  Cardiovascular: Normal rate, regular rhythm and normal heart sounds.  No murmur heard. Pulmonary/Chest: Effort normal and breath sounds normal. No respiratory distress. She has no wheezes. She has no rales. She exhibits no tenderness.  Musculoskeletal: She exhibits no edema.  Neurological: She is alert and oriented to person, place, and time.  Psychiatric: She has a normal mood and affect.  Nursing note and vitals reviewed.  BP (!) 174/66   Pulse 83   Temp 98 F (36.7 C) (Oral)   Resp 16   Ht  5\' 4"  (1.626 m)   Wt 125 lb 6.4 oz (56.9 kg)   SpO2 99%   BMI 21.52 kg/m  Wt Readings from Last 3 Encounters:  07/14/18 125 lb 6.4 oz (56.9 kg)  07/08/18 123 lb (55.8 kg)  07/08/18 123 lb (55.8 kg)     Lab Results  Component Value Date   WBC 4.0 07/08/2018   HGB 15.8 (H) 07/08/2018   HCT 49.8 (H) 07/08/2018   PLT 669 (H) 07/08/2018   GLUCOSE 96 06/26/2018   CHOL 328 (H) 04/02/2018   TRIG 105.0 04/02/2018   HDL 69.50 04/02/2018   LDLCALC 237 (H) 04/02/2018   ALT 20  06/26/2018   AST 24 06/26/2018   NA 143 06/26/2018   K 3.6 06/26/2018   CL 107 06/26/2018   CREATININE 0.97 06/26/2018   BUN 14 06/26/2018   CO2 25 06/26/2018   TSH 0.635 06/26/2018   INR 0.97 06/02/2018   HGBA1C 6.9 (A) 06/04/2018   MICROALBUR 410.0 (H) 04/02/2018    No results found.   Assessment & Plan:  Plan  I have discontinued Brandi Mccormick's lisinopril. I am also having her start on lisinopril. Additionally, I am having her maintain her ranitidine, ONETOUCH VERIO, atorvastatin, ONETOUCH DELICA LANCETS 51W, furosemide, aspirin EC, metFORMIN, and hydroxyurea. We will continue to administer sodium chloride.  Meds ordered this encounter  Medications  . lisinopril (PRINIVIL,ZESTRIL) 40 MG tablet    Sig: Take 1 tablet (40 mg total) by mouth daily.    Dispense:  90 tablet    Refill:  1    Problem List Items Addressed This Visit      Unprioritized   Essential hypertension - Primary   Relevant Medications   lisinopril (PRINIVIL,ZESTRIL) 40 MG tablet    inc lisinopril 40 mg daily Well controlled, no changes to meds. Encouraged heart healthy diet such as the DASH diet and exercise as tolerated.   Follow-up: Return in about 3 weeks (around 08/04/2018) for hypertension.  [name], DO

## 2018-07-14 NOTE — Patient Instructions (Signed)
DASH Eating Plan DASH stands for "Dietary Approaches to Stop Hypertension." The DASH eating plan is a healthy eating plan that has been shown to reduce high blood pressure (hypertension). It may also reduce your risk for type 2 diabetes, heart disease, and stroke. The DASH eating plan may also help with weight loss. What are tips for following this plan? General guidelines  Avoid eating more than 2,300 mg (milligrams) of salt (sodium) a day. If you have hypertension, you may need to reduce your sodium intake to 1,500 mg a day.  Limit alcohol intake to no more than 1 drink a day for nonpregnant women and 2 drinks a day for men. One drink equals 12 oz of beer, 5 oz of wine, or 1 oz of hard liquor.  Work with your health care provider to maintain a healthy body weight or to lose weight. Ask what an ideal weight is for you.  Get at least 30 minutes of exercise that causes your heart to beat faster (aerobic exercise) most days of the week. Activities may include walking, swimming, or biking.  Work with your health care provider or diet and nutrition specialist (dietitian) to adjust your eating plan to your individual calorie needs. Reading food labels  Check food labels for the amount of sodium per serving. Choose foods with less than 5 percent of the Daily Value of sodium. Generally, foods with less than 300 mg of sodium per serving fit into this eating plan.  To find whole grains, look for the word "whole" as the first word in the ingredient list. Shopping  Buy products labeled as "low-sodium" or "no salt added."  Buy fresh foods. Avoid canned foods and premade or frozen meals. Cooking  Avoid adding salt when cooking. Use salt-free seasonings or herbs instead of table salt or sea salt. Check with your health care provider or pharmacist before using salt substitutes.  Do not fry foods. Cook foods using healthy methods such as baking, boiling, grilling, and broiling instead.  Cook with  heart-healthy oils, such as olive, canola, soybean, or sunflower oil. Meal planning   Eat a balanced diet that includes: ? 5 or more servings of fruits and vegetables each day. At each meal, try to fill half of your plate with fruits and vegetables. ? Up to 6-8 servings of whole grains each day. ? Less than 6 oz of lean meat, poultry, or fish each day. A 3-oz serving of meat is about the same size as a deck of cards. One egg equals 1 oz. ? 2 servings of low-fat dairy each day. ? A serving of nuts, seeds, or beans 5 times each week. ? Heart-healthy fats. Healthy fats called Omega-3 fatty acids are found in foods such as flaxseeds and coldwater fish, like sardines, salmon, and mackerel.  Limit how much you eat of the following: ? Canned or prepackaged foods. ? Food that is high in trans fat, such as fried foods. ? Food that is high in saturated fat, such as fatty meat. ? Sweets, desserts, sugary drinks, and other foods with added sugar. ? Full-fat dairy products.  Do not salt foods before eating.  Try to eat at least 2 vegetarian meals each week.  Eat more home-cooked food and less restaurant, buffet, and fast food.  When eating at a restaurant, ask that your food be prepared with less salt or no salt, if possible. What foods are recommended? The items listed may not be a complete list. Talk with your dietitian about what   dietary choices are best for you. Grains Whole-grain or whole-wheat bread. Whole-grain or whole-wheat pasta. Brown rice. Oatmeal. Quinoa. Bulgur. Whole-grain and low-sodium cereals. Pita bread. Low-fat, low-sodium crackers. Whole-wheat flour tortillas. Vegetables Fresh or frozen vegetables (raw, steamed, roasted, or grilled). Low-sodium or reduced-sodium tomato and vegetable juice. Low-sodium or reduced-sodium tomato sauce and tomato paste. Low-sodium or reduced-sodium canned vegetables. Fruits All fresh, dried, or frozen fruit. Canned fruit in natural juice (without  added sugar). Meat and other protein foods Skinless chicken or turkey. Ground chicken or turkey. Pork with fat trimmed off. Fish and seafood. Egg whites. Dried beans, peas, or lentils. Unsalted nuts, nut butters, and seeds. Unsalted canned beans. Lean cuts of beef with fat trimmed off. Low-sodium, lean deli meat. Dairy Low-fat (1%) or fat-free (skim) milk. Fat-free, low-fat, or reduced-fat cheeses. Nonfat, low-sodium ricotta or cottage cheese. Low-fat or nonfat yogurt. Low-fat, low-sodium cheese. Fats and oils Soft margarine without trans fats. Vegetable oil. Low-fat, reduced-fat, or light mayonnaise and salad dressings (reduced-sodium). Canola, safflower, olive, soybean, and sunflower oils. Avocado. Seasoning and other foods Herbs. Spices. Seasoning mixes without salt. Unsalted popcorn and pretzels. Fat-free sweets. What foods are not recommended? The items listed may not be a complete list. Talk with your dietitian about what dietary choices are best for you. Grains Baked goods made with fat, such as croissants, muffins, or some breads. Dry pasta or rice meal packs. Vegetables Creamed or fried vegetables. Vegetables in a cheese sauce. Regular canned vegetables (not low-sodium or reduced-sodium). Regular canned tomato sauce and paste (not low-sodium or reduced-sodium). Regular tomato and vegetable juice (not low-sodium or reduced-sodium). Pickles. Olives. Fruits Canned fruit in a light or heavy syrup. Fried fruit. Fruit in cream or butter sauce. Meat and other protein foods Fatty cuts of meat. Ribs. Fried meat. Bacon. Sausage. Bologna and other processed lunch meats. Salami. Fatback. Hotdogs. Bratwurst. Salted nuts and seeds. Canned beans with added salt. Canned or smoked fish. Whole eggs or egg yolks. Chicken or turkey with skin. Dairy Whole or 2% milk, cream, and half-and-half. Whole or full-fat cream cheese. Whole-fat or sweetened yogurt. Full-fat cheese. Nondairy creamers. Whipped toppings.  Processed cheese and cheese spreads. Fats and oils Butter. Stick margarine. Lard. Shortening. Ghee. Bacon fat. Tropical oils, such as coconut, palm kernel, or palm oil. Seasoning and other foods Salted popcorn and pretzels. Onion salt, garlic salt, seasoned salt, table salt, and sea salt. Worcestershire sauce. Tartar sauce. Barbecue sauce. Teriyaki sauce. Soy sauce, including reduced-sodium. Steak sauce. Canned and packaged gravies. Fish sauce. Oyster sauce. Cocktail sauce. Horseradish that you find on the shelf. Ketchup. Mustard. Meat flavorings and tenderizers. Bouillon cubes. Hot sauce and Tabasco sauce. Premade or packaged marinades. Premade or packaged taco seasonings. Relishes. Regular salad dressings. Where to find more information:  National Heart, Lung, and Blood Institute: www.nhlbi.nih.gov  American Heart Association: www.heart.org Summary  The DASH eating plan is a healthy eating plan that has been shown to reduce high blood pressure (hypertension). It may also reduce your risk for type 2 diabetes, heart disease, and stroke.  With the DASH eating plan, you should limit salt (sodium) intake to 2,300 mg a day. If you have hypertension, you may need to reduce your sodium intake to 1,500 mg a day.  When on the DASH eating plan, aim to eat more fresh fruits and vegetables, whole grains, lean proteins, low-fat dairy, and heart-healthy fats.  Work with your health care provider or diet and nutrition specialist (dietitian) to adjust your eating plan to your individual   calorie needs. This information is not intended to replace advice given to you by your health care provider. Make sure you discuss any questions you have with your health care provider. Document Released: 08/08/2011 Document Revised: 08/12/2016 Document Reviewed: 08/12/2016 Elsevier Interactive Patient Education  2018 Elsevier Inc.  

## 2018-07-15 ENCOUNTER — Other Ambulatory Visit: Payer: Self-pay | Admitting: Internal Medicine

## 2018-07-24 ENCOUNTER — Encounter: Payer: Self-pay | Admitting: Hematology and Oncology

## 2018-07-24 ENCOUNTER — Inpatient Hospital Stay (HOSPITAL_BASED_OUTPATIENT_CLINIC_OR_DEPARTMENT_OTHER): Payer: Medicare HMO | Admitting: Hematology and Oncology

## 2018-07-24 VITALS — BP 230/98 | HR 77 | Temp 97.8°F | Resp 18 | Ht 64.0 in | Wt 123.3 lb

## 2018-07-24 DIAGNOSIS — K219 Gastro-esophageal reflux disease without esophagitis: Secondary | ICD-10-CM | POA: Diagnosis not present

## 2018-07-24 DIAGNOSIS — Z7984 Long term (current) use of oral hypoglycemic drugs: Secondary | ICD-10-CM | POA: Diagnosis not present

## 2018-07-24 DIAGNOSIS — I700 Atherosclerosis of aorta: Secondary | ICD-10-CM | POA: Diagnosis not present

## 2018-07-24 DIAGNOSIS — D469 Myelodysplastic syndrome, unspecified: Secondary | ICD-10-CM | POA: Diagnosis not present

## 2018-07-24 DIAGNOSIS — Z7982 Long term (current) use of aspirin: Secondary | ICD-10-CM | POA: Diagnosis not present

## 2018-07-24 DIAGNOSIS — Z79899 Other long term (current) drug therapy: Secondary | ICD-10-CM

## 2018-07-24 DIAGNOSIS — I10 Essential (primary) hypertension: Secondary | ICD-10-CM

## 2018-07-24 DIAGNOSIS — D45 Polycythemia vera: Secondary | ICD-10-CM | POA: Diagnosis not present

## 2018-07-24 DIAGNOSIS — K5730 Diverticulosis of large intestine without perforation or abscess without bleeding: Secondary | ICD-10-CM | POA: Diagnosis not present

## 2018-07-24 DIAGNOSIS — I2510 Atherosclerotic heart disease of native coronary artery without angina pectoris: Secondary | ICD-10-CM | POA: Diagnosis not present

## 2018-07-24 DIAGNOSIS — E119 Type 2 diabetes mellitus without complications: Secondary | ICD-10-CM | POA: Diagnosis not present

## 2018-07-24 MED ORDER — CLONIDINE HCL 0.1 MG PO TABS
0.1000 mg | ORAL_TABLET | Freq: Once | ORAL | Status: AC
  Administered 2018-07-24: 0.1 mg via ORAL

## 2018-07-24 MED ORDER — CLONIDINE HCL 0.1 MG PO TABS
ORAL_TABLET | ORAL | Status: AC
  Filled 2018-07-24: qty 1

## 2018-07-24 NOTE — Patient Instructions (Signed)
Continue hydroxyurea at the same dose until your next visit with instructions.  You need to have lab work done prior to every visit.  An appointment has been scheduled with the physician assistant, Lattie Haw or Monday, November 25.    Come in at 11:30 AM for lab work.  He will see Lattie Haw, physician assistant at 12:15 PM.  In the future, you will be followed by both Lattie Haw and Dr. Benay Spice.  An appointment will be scheduled at the time of your next visit with Lattie Haw and adjustment in the hydroxyurea as necessary.

## 2018-07-24 NOTE — Progress Notes (Signed)
  Hematology/Oncology OutpatientProgress Note  Date of Service: July 24, 2018  Indiah came in today for reevaluation in the setting of polycythemia vera; now on hydroxyurea: 500 mg twice daily.  Unfortunately, she did not obtain lab work prior to the lab closing today. In addition, her blood pressure was markedly elevated again. She was given clonidine: 0.1 mg x 1 dose. AGAINST MEDICAL ADVICE, she left before her blood pressure decreased.  Physical Examination: Vital Signs: Body surface area is 1.59 meters squared.  BP 217/87    HR 83    RR 18   T 97.8     O2 sat Sat.  100% ECOG PERFORMANCE STATUS:1 Constitutional:LyndaMitchellis afully nourished and developedAfrican-American. He looks age appropriate. He is friendly and cooperative without respiratory compromise at rest. Skin: No rashes, scaling, dryness, jaundice, or itching. HEENT: Head is normocephalic and atraumatic. Pupils are equal round and reactive to light and accommodation. Sclerae are anicteric. Conjunctivae are pink. Lips without cracking or peeling; tongue without mass, inflammation, or nodularity. Mucous membranes are moist. Neck: Supple and symmetric. No jugular venous distention or thyromegaly. Trachea is midline. Lymphatics: No cervical or supraclavicular lymphadenopathy. No epitrochlear, axillary, or inguinal lymphadenopathy is appreciated. Respiratory/chest: Thorax is symmetrical. Breath sounds are clear to auscultation and percussion. Normal excursion and respiratory effort. Cardiovascular: Heart rate and rhythm are regular without murmurs. Gastrointestinal: Abdomen is soft, nontender; no organomegaly. Bowel sounds are normoactive. No masses are appreciated. Extremities: In the lower extremities, there is no asymmetric swelling, erythema, tenderness, or cord formation. No clubbing, cyanosis, nor edema. Neurological:There are no gross neurologic deficits.  Review of Systems: She denies any new  problems or complaints. She has no dizziness, lightheadedness, syncope, near syncopal episodes. She denies any headache or vision changes. There is no rash or itching. She has no chest or abdominal pain.   She has no dyspnea either at rest or on minimal exertion. At the time of her last visit, she was sent to the emergency department for "malignant hypertension." Since then, her lisinopril has been increased to lisinopril: 40 mg once daily.  Recommendation/Plan: She was advised to stay on hydroxyurea: 500 mg twice daily until the time of her next visit on November 25. Lattie Haw, Dr. Gearldine Shown physician assistant, kindly agreed to see her on short notice at 12:15PM on November 25. She was instructed to obtain lab work first at 11:30 AM before meeting with Lattie Haw. If necessary adjustments in medication will be forthcoming. I informed her that I will be leaving the practice and that Dr. Benay Spice and his team will be following her in the future.  Henreitta Leber, MD Hematology/Oncology

## 2018-07-24 NOTE — Progress Notes (Signed)
Pt here for office visit with Dr. Audelia Hives.  BP increased ; Dr. Audelia Hives notified.  Pt stated she had taken all her BP meds today.  Stated she is always anxious when coming to doctor's office.  Pt does check her BP at home,  and it is always normal. Order received for Clonidine 0.1 mg po.  Med given at 1719.  Pt was very anxious to go since she has to pick up her granddaughter.  Instructed pt to wait at least 30 minutes post Clonidine; however, pt refused and stated she had to go.  BP rechecked and documented  230/98.  Dr. Audelia Hives notified.  Pt left Against Medical Advice.

## 2018-07-27 ENCOUNTER — Inpatient Hospital Stay: Payer: Medicare HMO

## 2018-07-27 ENCOUNTER — Telehealth: Payer: Self-pay | Admitting: Nurse Practitioner

## 2018-07-27 ENCOUNTER — Inpatient Hospital Stay (HOSPITAL_BASED_OUTPATIENT_CLINIC_OR_DEPARTMENT_OTHER): Payer: Medicare HMO | Admitting: Nurse Practitioner

## 2018-07-27 VITALS — BP 192/92 | HR 80 | Temp 98.2°F | Resp 18 | Ht 64.0 in | Wt 122.1 lb

## 2018-07-27 DIAGNOSIS — E119 Type 2 diabetes mellitus without complications: Secondary | ICD-10-CM | POA: Diagnosis not present

## 2018-07-27 DIAGNOSIS — I10 Essential (primary) hypertension: Secondary | ICD-10-CM | POA: Diagnosis not present

## 2018-07-27 DIAGNOSIS — D471 Chronic myeloproliferative disease: Secondary | ICD-10-CM

## 2018-07-27 DIAGNOSIS — I700 Atherosclerosis of aorta: Secondary | ICD-10-CM | POA: Diagnosis not present

## 2018-07-27 DIAGNOSIS — Z79899 Other long term (current) drug therapy: Secondary | ICD-10-CM

## 2018-07-27 DIAGNOSIS — N189 Chronic kidney disease, unspecified: Secondary | ICD-10-CM

## 2018-07-27 DIAGNOSIS — Z7982 Long term (current) use of aspirin: Secondary | ICD-10-CM | POA: Diagnosis not present

## 2018-07-27 DIAGNOSIS — K219 Gastro-esophageal reflux disease without esophagitis: Secondary | ICD-10-CM | POA: Diagnosis not present

## 2018-07-27 DIAGNOSIS — D45 Polycythemia vera: Secondary | ICD-10-CM | POA: Diagnosis not present

## 2018-07-27 DIAGNOSIS — D469 Myelodysplastic syndrome, unspecified: Secondary | ICD-10-CM

## 2018-07-27 DIAGNOSIS — K5730 Diverticulosis of large intestine without perforation or abscess without bleeding: Secondary | ICD-10-CM | POA: Diagnosis not present

## 2018-07-27 DIAGNOSIS — Z7984 Long term (current) use of oral hypoglycemic drugs: Secondary | ICD-10-CM | POA: Diagnosis not present

## 2018-07-27 DIAGNOSIS — I2510 Atherosclerotic heart disease of native coronary artery without angina pectoris: Secondary | ICD-10-CM | POA: Diagnosis not present

## 2018-07-27 LAB — COMPREHENSIVE METABOLIC PANEL
ALBUMIN: 2.7 g/dL — AB (ref 3.5–5.0)
ALK PHOS: 113 U/L (ref 38–126)
ALT: 16 U/L (ref 0–44)
AST: 19 U/L (ref 15–41)
Anion gap: 8 (ref 5–15)
BUN: 17 mg/dL (ref 8–23)
CALCIUM: 9.5 mg/dL (ref 8.9–10.3)
CO2: 26 mmol/L (ref 22–32)
CREATININE: 1.34 mg/dL — AB (ref 0.44–1.00)
Chloride: 107 mmol/L (ref 98–111)
GFR calc Af Amer: 43 mL/min — ABNORMAL LOW (ref >60)
GFR calc non Af Amer: 37 mL/min — ABNORMAL LOW (ref >60)
GLUCOSE: 177 mg/dL — AB (ref 70–99)
Potassium: 3.8 mmol/L (ref 3.5–5.1)
Sodium: 141 mmol/L (ref 135–145)
Total Bilirubin: 0.5 mg/dL (ref 0.3–1.2)
Total Protein: 6.7 g/dL (ref 6.5–8.1)

## 2018-07-27 LAB — CBC WITH DIFFERENTIAL (CANCER CENTER ONLY)
ABS IMMATURE GRANULOCYTES: 0 10*3/uL (ref 0.00–0.07)
BASOS ABS: 0.1 10*3/uL (ref 0.0–0.1)
Basophils Relative: 2 %
EOS PCT: 1 %
Eosinophils Absolute: 0 10*3/uL (ref 0.0–0.5)
HEMATOCRIT: 42.4 % (ref 36.0–46.0)
HEMOGLOBIN: 13.8 g/dL (ref 12.0–15.0)
IMMATURE GRANULOCYTES: 0 %
LYMPHS ABS: 1.6 10*3/uL (ref 0.7–4.0)
LYMPHS PCT: 42 %
MCH: 27 pg (ref 26.0–34.0)
MCHC: 32.5 g/dL (ref 30.0–36.0)
MCV: 82.8 fL (ref 80.0–100.0)
Monocytes Absolute: 0.4 10*3/uL (ref 0.1–1.0)
Monocytes Relative: 11 %
NEUTROS ABS: 1.7 10*3/uL (ref 1.7–7.7)
NEUTROS PCT: 44 %
NRBC: 0 % (ref 0.0–0.2)
Platelet Count: 600 10*3/uL — ABNORMAL HIGH (ref 150–400)
RBC: 5.12 MIL/uL — ABNORMAL HIGH (ref 3.87–5.11)
RDW: 23.2 % — ABNORMAL HIGH (ref 11.5–15.5)
WBC Count: 3.8 10*3/uL — ABNORMAL LOW (ref 4.0–10.5)

## 2018-07-27 NOTE — Patient Instructions (Addendum)
Decrease Hydrea to 500 mg daily

## 2018-07-27 NOTE — Progress Notes (Addendum)
  [facility] OFFICE PROGRESS NOTE   Diagnosis: Polycythemia vera  INTERVAL HISTORY:   Brandi Mccormick is a 77 year old woman previously followed by Dr. Audelia Hives for polycythemia vera.  She is currently on hydroxyurea 500 mg twice daily.  She takes aspirin 81 mg daily.  She denies nausea/vomiting.  No mouth sores.  No diarrhea.  No rash.  She denies leg swelling and calf pain.  No shortness of breath or chest pain.  She denies bleeding.  She reports a recent adjustment to the lisinopril dose for management of hypertension.  She reports past medical history significant for high blood pressure, diabetes, focal/segmental glomerular sclerosis.  Objective:  Vital signs in last 24 hours:  Blood pressure (!) 192/92, pulse 80, temperature 98.2 F (36.8 C), temperature source Oral, resp. rate 18, height 5\' 4"  (1.626 m), weight 122 lb 1.6 oz (55.4 kg), SpO2 99 %.    HEENT: No thrush or ulcers. Lymphatics: No palpable cervical or supraclavicular lymph nodes. Resp: Lungs clear bilaterally. Cardio: Regular rate and rhythm. GI: Abdomen soft and nontender.  No hepatosplenomegaly. Vascular: No leg edema.  Calves soft and nontender. Skin: No rash.   Lab Results:  Lab Results  Component Value Date   WBC 3.8 (L) 07/27/2018   HGB 13.8 07/27/2018   HCT 42.4 07/27/2018   MCV 82.8 07/27/2018   PLT 600 (H) 07/27/2018   NEUTROABS 1.7 07/27/2018    Imaging:  No results found.  Medications: I have reviewed the patient's current medications.  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 Type 2 diabetes 3. Hypertension 4. Focal/segmental glomerular sclerosis  Disposition: Brandi Mccormick appears stable.  We reviewed the diagnosis of polycythemia vera and the indication for hydroxyurea with her at today's visit.  She has been taking hydroxyurea 500 mg twice daily  for about 1 month with a decrease in the hemoglobin, platelet count and white count.  Taking into account the blood counts from today and her renal function Dr. Benay Spice recommends decreasing the hydroxyurea to 500 mg daily.  She will return for lab and a follow-up visit in 3 weeks.  She will contact the office in the interim with any problems.  She will continue follow-up with Dr. Etter Sjogren for management of hypertension.  Patient seen with Dr. Benay Spice.  25 minutes were spent face-to-face at today's visit with the majority of that time involved in counseling/coordination of care.    Ned Card ANP/GNP-BC   07/27/2018  12:38 PM This was a shared visit with Ned Card.  Brandi Mccormick was interviewed and examined.  She has been diagnosed with polycythemia vera.  She will continue hydroxyurea.  We adjusted the hydroxyurea dose.  We discussed the reason for the hydroxyurea and aspirin therapy.  The hemoglobin and platelet count are moving toward the goal range.  Julieanne Manson, MD

## 2018-07-27 NOTE — Telephone Encounter (Signed)
Printed calendar and avs. °

## 2018-08-04 ENCOUNTER — Ambulatory Visit: Payer: Medicare HMO | Admitting: Sports Medicine

## 2018-08-04 ENCOUNTER — Encounter: Payer: Self-pay | Admitting: Sports Medicine

## 2018-08-04 DIAGNOSIS — M79671 Pain in right foot: Secondary | ICD-10-CM

## 2018-08-04 DIAGNOSIS — B351 Tinea unguium: Secondary | ICD-10-CM

## 2018-08-04 DIAGNOSIS — M79672 Pain in left foot: Secondary | ICD-10-CM

## 2018-08-04 DIAGNOSIS — E119 Type 2 diabetes mellitus without complications: Secondary | ICD-10-CM | POA: Diagnosis not present

## 2018-08-04 NOTE — Progress Notes (Signed)
Patient ID: Brandi Mccormick, female   DOB: 03-30-1941, 77 y.o.   MRN: [id number]   Subjective: Brandi Mccormick is a 77 y.o. female patient with history of type 2 diabetes who presents to office today complaining of thick, long, painful nails  while ambulating in shoes; unable to trim. Patient states that the glucose reading this morning was not recorded but yesterday was 138. Admits to change with a platelet medication since last visit.No other issues noted.     Patient Active Problem List   Diagnosis Date Noted  . Hyperlipidemia associated with type 2 diabetes mellitus ([place]) 04/02/2018  . 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 Pneumonia   . Essential hypertension 01/14/2017  . Hyperlipidemia LDL goal <70 11/15/2016  . Polycythemia vera ([place]) 07/01/2014  . Protein-calorie malnutrition, severe ([place]) 06/25/2014  . Protein-calorie malnutrition ([place]) 06/24/2014  . Hypokalemia 06/24/2014  . CAP (community acquired pneumonia) 06/24/2014  . H/O hyperthyroidism 12/21/2013  . Diabetes mellitus type 2, noninsulin dependent ([place]) 11/16/2013   Current Outpatient Medications on File Prior to Visit  Medication Sig Dispense Refill  . aspirin EC 81 MG tablet Take 81 mg by mouth daily.    Marland Kitchen atorvastatin (LIPITOR) 40 MG tablet TAKE 1 TABLET BY MOUTH ONCE DAILY (Patient taking differently: Take 40 mg by mouth daily. ) 90 tablet 1  . furosemide (LASIX) 20 MG tablet Take 20 mg by mouth 2 (two) times daily.    . hydroxyurea (HYDREA) 500 MG capsule Take 1 capsule 2 times daily.  May take with food to minimize GI side effects. 60 capsule 2  . lisinopril (PRINIVIL,ZESTRIL) 40 MG tablet Take 1 tablet (40 mg total) by mouth daily. 90 tablet 1  . metFORMIN (GLUCOPHAGE-XR) 750 MG 24 hr tablet Take 1 tablet (750 mg total) by mouth daily with supper. 90 tablet 3  . metFORMIN (GLUCOPHAGE-XR) 750 MG 24 hr tablet TAKE 1 TABLET BY MOUTH ONCE DAILY 90 tablet 0  . ONETOUCH DELICA LANCETS 79G MISC OneTouch Delica Lancets 33  gauge    . ONETOUCH VERIO test strip USE 1 STRIP TO CHECK GLUCOSE ONCE DAILY 50 each 7  . ranitidine (ZANTAC) 150 MG tablet Take 1 tablet (150 mg total) by mouth 2 (two) times daily. (Patient taking differently: Take 150 mg by mouth daily. ) 60 tablet 1   Current Facility-Administered Medications on File Prior to Visit  Medication Dose Route Frequency Provider Last Rate Last Dose  . 0.9 %  sodium chloride infusion  500 mL Intravenous Continuous Armbruster, Carlota Raspberry, MD       Allergies  Allergen Reactions  . Penicillins Nausea And Vomiting    Has patient had a PCN reaction causing immediate rash, facial/tongue/throat swelling, SOB or lightheadedness with hypotension: no Has patient had a PCN reaction causing severe rash involving mucus membranes or skin necrosis: no Has patient had a PCN reaction that required hospitalization: no Has patient had a PCN reaction occurring within the last 10 years: no If all of the above answers are "NO", then may proceed with Cephalosporin use.    Objective: General: Patient is awake, alert, and oriented x 3 and in no acute distress.  Integument: Skin is warm, dry and supple bilateral. Nails are tender, long, thickened and  dystrophic with subungual debris, consistent with onychomycosis, 1-5 bilateral. No signs of infection. No open lesions or preulcerative lesions present bilateral. Remaining integument unremarkable.  Vasculature:  Dorsalis Pedis pulse 2/4 bilateral. Posterior Tibial pulse  1/4 bilateral.  Capillary fill time <  3 sec 1-5 bilateral. Positive hair growth to the level of the digits. Temperature gradient within normal limits. No varicosities present bilateral. Trace edema present bilateral.   Neurology: The patient has intact sensation measured with a 5.07/10g Semmes Weinstein Monofilament at all pedal sites bilateral . Vibratory sensation intact bilateral with tuning fork. No Babinski sign present bilateral.   Musculoskeletal: Asymptomatic  mild bunion deformities noted bilateral, R>L. Muscular strength 5/5 in all lower extremity muscular groups bilateral without pain or limitation on range of motion . No tenderness with calf compression bilateral.  Assessment and Plan: Problem List Items Addressed This Visit    None    Visit Diagnoses    Dermatophytosis of nail    -  Primary   Foot pain, bilateral       Diabetes mellitus without complication ([place])         -Examined patient. -Discussed and educated patient on diabetic foot care, especially with regards to the vascular, neurological and musculoskeletal systems.  -Mechanically debrided all nails 1-5 bilateral using sterile nail nipper and filed with dremel without incident  -Patient to return in 2.5 to 3 months for at risk foot care -Patient advised to call the office if any problems or questions arise in the meantime.  [name], DPM

## 2018-08-05 DIAGNOSIS — R69 Illness, unspecified: Secondary | ICD-10-CM | POA: Diagnosis not present

## 2018-08-07 DIAGNOSIS — N051 Unspecified nephritic syndrome with focal and segmental glomerular lesions: Secondary | ICD-10-CM | POA: Diagnosis not present

## 2018-08-07 DIAGNOSIS — I129 Hypertensive chronic kidney disease with stage 1 through stage 4 chronic kidney disease, or unspecified chronic kidney disease: Secondary | ICD-10-CM | POA: Diagnosis not present

## 2018-08-07 DIAGNOSIS — E1129 Type 2 diabetes mellitus with other diabetic kidney complication: Secondary | ICD-10-CM | POA: Diagnosis not present

## 2018-08-07 DIAGNOSIS — E785 Hyperlipidemia, unspecified: Secondary | ICD-10-CM | POA: Diagnosis not present

## 2018-08-07 DIAGNOSIS — D473 Essential (hemorrhagic) thrombocythemia: Secondary | ICD-10-CM | POA: Diagnosis not present

## 2018-08-17 ENCOUNTER — Inpatient Hospital Stay (HOSPITAL_BASED_OUTPATIENT_CLINIC_OR_DEPARTMENT_OTHER): Payer: Medicare HMO | Admitting: Nurse Practitioner

## 2018-08-17 ENCOUNTER — Encounter: Payer: Self-pay | Admitting: Nurse Practitioner

## 2018-08-17 ENCOUNTER — Inpatient Hospital Stay: Payer: Medicare HMO | Attending: Hematology and Oncology

## 2018-08-17 VITALS — BP 175/88 | HR 78 | Temp 97.9°F | Resp 18 | Ht 64.0 in | Wt 124.9 lb

## 2018-08-17 DIAGNOSIS — E119 Type 2 diabetes mellitus without complications: Secondary | ICD-10-CM | POA: Insufficient documentation

## 2018-08-17 DIAGNOSIS — D471 Chronic myeloproliferative disease: Secondary | ICD-10-CM

## 2018-08-17 DIAGNOSIS — I10 Essential (primary) hypertension: Secondary | ICD-10-CM

## 2018-08-17 DIAGNOSIS — Z79899 Other long term (current) drug therapy: Secondary | ICD-10-CM | POA: Diagnosis not present

## 2018-08-17 DIAGNOSIS — D45 Polycythemia vera: Secondary | ICD-10-CM

## 2018-08-17 LAB — CBC WITH DIFFERENTIAL (CANCER CENTER ONLY)
ABS IMMATURE GRANULOCYTES: 0.01 10*3/uL (ref 0.00–0.07)
Basophils Absolute: 0 10*3/uL (ref 0.0–0.1)
Basophils Relative: 0 %
Eosinophils Absolute: 0.1 10*3/uL (ref 0.0–0.5)
Eosinophils Relative: 3 %
HCT: 38.5 % (ref 36.0–46.0)
HEMOGLOBIN: 12.6 g/dL (ref 12.0–15.0)
IMMATURE GRANULOCYTES: 0 %
LYMPHS PCT: 26 %
Lymphs Abs: 1.3 10*3/uL (ref 0.7–4.0)
MCH: 27.8 pg (ref 26.0–34.0)
MCHC: 32.7 g/dL (ref 30.0–36.0)
MCV: 84.8 fL (ref 80.0–100.0)
MONO ABS: 0.5 10*3/uL (ref 0.1–1.0)
MONOS PCT: 11 %
NEUTROS ABS: 2.9 10*3/uL (ref 1.7–7.7)
Neutrophils Relative %: 60 %
Platelet Count: 148 10*3/uL — ABNORMAL LOW (ref 150–400)
RBC: 4.54 MIL/uL (ref 3.87–5.11)
RDW: 25 % — ABNORMAL HIGH (ref 11.5–15.5)
WBC: 4.9 10*3/uL (ref 4.0–10.5)
nRBC: 0 % (ref 0.0–0.2)

## 2018-08-17 NOTE — Progress Notes (Signed)
  [place] OFFICE PROGRESS NOTE   Diagnosis: Polycythemia vera  INTERVAL HISTORY:   Brandi Mccormick returns as scheduled.  She continues hydroxyurea 500 mg daily.  She feels well.  She denies nausea/vomiting.  No mouth sores.  No diarrhea.  No rash.  No bleeding.  No symptoms of thrombosis.  Objective:  Vital signs in last 24 hours:  Blood pressure (!) 175/88, pulse 78, temperature 97.9 F (36.6 C), temperature source Oral, resp. rate 18, height 5\' 4"  (1.626 m), weight 124 lb 14.4 oz (56.7 kg), SpO2 100 %.    HEENT: No thrush or ulcers. Resp: Lungs clear bilaterally. Cardio: Regular rate and rhythm. GI: Abdomen soft and nontender.  No hepatosplenomegaly. Vascular: No leg edema.   Lab Results:  Lab Results  Component Value Date   WBC 4.9 08/17/2018   HGB 12.6 08/17/2018   HCT 38.5 08/17/2018   MCV 84.8 08/17/2018   PLT 148 (L) 08/17/2018   NEUTROABS 2.9 08/17/2018    Imaging:  No results found.  Medications: I have reviewed the patient's current medications.  Assessment/Plan: 1. Polycythemia vera  JAK2 mutation (V617F)  Hydroxyurea 500 mg twice daily 06/26/2018  Hydroxyurea decreased to 500 mg daily beginning 07/27/2018  Hydroxyurea placed on hold 08/17/2018 2. Type 2 diabetes 3. Hypertension 4. Focal/segmental glomerular sclerosis  Disposition: Ms. Imperato appears stable.  Hydroxyurea dose was decreased from 500 mg twice daily to 500 mg daily about 3 weeks ago.  We reviewed the CBC from today.  Hemoglobin is in goal range.  The white count is stable.  The platelet count has continued to decline, now 148,000.  Dr. Benay Spice recommends placing hydroxyurea on hold.  She will return for a follow-up CBC and office visit in 2 weeks.  She understands to contact the office in the interim with any problems.  We specifically discussed bleeding.    Ned Card ANP/GNP-BC   08/17/2018  10:05 AM

## 2018-08-17 NOTE — Patient Instructions (Signed)
HOLD Hydrea

## 2018-08-22 DIAGNOSIS — R509 Fever, unspecified: Secondary | ICD-10-CM | POA: Diagnosis not present

## 2018-08-22 DIAGNOSIS — R197 Diarrhea, unspecified: Secondary | ICD-10-CM | POA: Diagnosis not present

## 2018-08-31 ENCOUNTER — Encounter: Payer: Self-pay | Admitting: Nurse Practitioner

## 2018-08-31 ENCOUNTER — Telehealth: Payer: Self-pay | Admitting: Nurse Practitioner

## 2018-08-31 ENCOUNTER — Inpatient Hospital Stay (HOSPITAL_BASED_OUTPATIENT_CLINIC_OR_DEPARTMENT_OTHER): Payer: Medicare HMO | Admitting: Nurse Practitioner

## 2018-08-31 ENCOUNTER — Telehealth: Payer: Self-pay

## 2018-08-31 ENCOUNTER — Inpatient Hospital Stay: Payer: Medicare HMO

## 2018-08-31 VITALS — BP 213/83 | HR 79 | Temp 98.5°F | Resp 18 | Ht 64.0 in | Wt 126.2 lb

## 2018-08-31 DIAGNOSIS — D45 Polycythemia vera: Secondary | ICD-10-CM

## 2018-08-31 DIAGNOSIS — D471 Chronic myeloproliferative disease: Secondary | ICD-10-CM

## 2018-08-31 DIAGNOSIS — Z79899 Other long term (current) drug therapy: Secondary | ICD-10-CM

## 2018-08-31 DIAGNOSIS — E119 Type 2 diabetes mellitus without complications: Secondary | ICD-10-CM | POA: Diagnosis not present

## 2018-08-31 DIAGNOSIS — I10 Essential (primary) hypertension: Secondary | ICD-10-CM

## 2018-08-31 LAB — CBC WITH DIFFERENTIAL (CANCER CENTER ONLY)
Abs Immature Granulocytes: 0.01 10*3/uL (ref 0.00–0.07)
BASOS ABS: 0 10*3/uL (ref 0.0–0.1)
Basophils Relative: 1 %
EOS PCT: 14 %
Eosinophils Absolute: 0.9 10*3/uL — ABNORMAL HIGH (ref 0.0–0.5)
HCT: 35.6 % — ABNORMAL LOW (ref 36.0–46.0)
HEMOGLOBIN: 11.8 g/dL — AB (ref 12.0–15.0)
Immature Granulocytes: 0 %
Lymphocytes Relative: 30 %
Lymphs Abs: 1.9 10*3/uL (ref 0.7–4.0)
MCH: 28.6 pg (ref 26.0–34.0)
MCHC: 33.1 g/dL (ref 30.0–36.0)
MCV: 86.2 fL (ref 80.0–100.0)
Monocytes Absolute: 0.4 10*3/uL (ref 0.1–1.0)
Monocytes Relative: 6 %
NRBC: 0 % (ref 0.0–0.2)
Neutro Abs: 3.1 10*3/uL (ref 1.7–7.7)
Neutrophils Relative %: 49 %
Platelet Count: 513 10*3/uL — ABNORMAL HIGH (ref 150–400)
RBC: 4.13 MIL/uL (ref 3.87–5.11)
RDW: 26.7 % — AB (ref 11.5–15.5)
WBC: 6.3 10*3/uL (ref 4.0–10.5)

## 2018-08-31 NOTE — Telephone Encounter (Signed)
Will you call pt and ask did she take her lisinopril and lasix today. Does she have blood pressure cuff at home. Can she check bp today and let us know what reading is.   Will you verify no cardiac or neurologic signs or symptoms.  Can she have nurse bp check tomorrow. If so make sure you remind her to take meds in morning.  If any cardiac or neuro signs or symptoms when you call her then advise ED evaluation.

## 2018-08-31 NOTE — Telephone Encounter (Signed)
Pt. Verified that she is taking her BP meds as prescribed, and denies any symptoms (HA, N/V). Appointment made with nurse for tmr. 12/31 per Percell Miller Saguier's recommendation; pt. Verbalized understanding.

## 2018-08-31 NOTE — Telephone Encounter (Signed)
Printed calendar and avs. °

## 2018-08-31 NOTE — Telephone Encounter (Signed)
Received call from [facility] at [facility], who reported that pt. came in today with BP of 200/90, no unusual symptoms however. "Pt. has routinely had high blood pressure, and when she is told to follow-up with her primary doctor, she doesn't. She did say she took her medication this morning". Pt. on lisinopril 40mg  daily and lasix 20mg  bid. Pt. last seen by Dr. Etter Sjogren on 11/12 for HTN, BP 176/66. Author phoned pt. to follow-up, but no answer. Author left generic VM asking for return call. Routed to [place], DOD and Dr. Nonda Lou covering provider, Mackie Pai, to advise.

## 2018-08-31 NOTE — Progress Notes (Addendum)
  [place] OFFICE PROGRESS NOTE   Diagnosis: Polycythemia vera  INTERVAL HISTORY:   Ms. Osland returns as scheduled.  Hydroxyurea was placed on hold 08/17/2018 due to thrombocytopenia.  She reports feeling "great".  She denies nausea/vomiting.  No diarrhea.  No rash.  No signs of thrombosis.  No headaches.  No shortness of breath.  No chest pain.  No vision change.  Objective:  Vital signs in last 24 hours:  Blood pressure (!) 213/83, pulse 79, temperature 98.5 F (36.9 C), temperature source Oral, resp. rate 18, height 5\' 4"  (1.626 m), weight 126 lb 3.2 oz (57.2 kg), SpO2 97 %.    HEENT: No thrush or ulcers. Resp: Lungs clear bilaterally. Cardio: Regular rate and rhythm. GI: Abdomen soft and nontender.  No hepatosplenomegaly. Vascular: No leg edema.  Calves soft and nontender.  Skin: No rash.   Lab Results:  Lab Results  Component Value Date   WBC 6.3 08/31/2018   HGB 11.8 (L) 08/31/2018   HCT 35.6 (L) 08/31/2018   MCV 86.2 08/31/2018   PLT 513 (H) 08/31/2018   NEUTROABS 3.1 08/31/2018    Imaging:  No results found.  Medications: I have reviewed the patient's current medications.  Assessment/Plan: 1. Polycythemia vera  JAK18mutation (V617F)  Hydroxyurea 500 mg twice daily 06/26/2018  Hydroxyurea decreased to 500 mg daily beginning 07/27/2018  Hydroxyurea placed on hold 08/17/2018  Hydroxyurea resumed at a dose of 500 mg every other day beginning 08/31/2018 2. Type 2 diabetes 3. Hypertension 4. Focal/segmental glomerular sclerosis  Disposition: Ms. Morici appears stable.  Hydroxyurea was placed on hold 08/17/2018 due to mild thrombocytopenia.  We reviewed the CBC from today.  The platelet count is higher.  She will resume hydroxyurea 500 mg every other day.  She will return for a CBC and follow-up visit in 3 weeks.  Her blood pressure has been elevated on multiple occasions in our office.  We contacted her PCP to request an  appointment today.  They will contact her.  Plan reviewed with Dr. Benay Spice.    Ned Card ANP/GNP-BC   08/31/2018  10:21 AM

## 2018-09-01 ENCOUNTER — Ambulatory Visit (INDEPENDENT_AMBULATORY_CARE_PROVIDER_SITE_OTHER): Payer: Medicare HMO | Admitting: Medical

## 2018-09-01 VITALS — BP 179/90 | HR 73

## 2018-09-01 DIAGNOSIS — I10 Essential (primary) hypertension: Secondary | ICD-10-CM

## 2018-09-01 MED ORDER — AMLODIPINE BESYLATE 10 MG PO TABS: 10.0000 mg | ORAL_TABLET | Freq: Every day | ORAL | 0 refills | Status: DC

## 2018-09-01 NOTE — Telephone Encounter (Signed)
Patietn in today for BP check. Medications adjusted by E. Saguier and follow up appointment scheduled.

## 2018-09-01 NOTE — Progress Notes (Addendum)
Pre visit review using our clinic tool,if applicable. No additional management support is needed unless otherwise documented below in the visit note.   Pt here for Blood pressure check per order from E. Saguier,PA-C  Pt currently takes:Lisinopril 40 mg daily and Lasix 20 mg bid.   Pt reports compliance with medication. States she is not having chest pain,blurred vision nor dizziness. No headaches at this time.     BP today @ = 179/90 HR = 73  Pt advised per Mackie Pai  to start AMlodipine 10 mg daily and return for OV in 1 month. Appointment scheduled for September 09, 2018.    This is advise I gave. Did talk with pt as well. No cardiac or neurologic signs or symptoms. Normal neurologic exam. Prior use amlodipine in the past no side effects per pt. She is already on lasix and lisinopril. No pedal edema. Janin Kozlowski sent in rx of amlodipine. Will follow up next week with me for blood pressure check/office visit.

## 2018-09-02 HISTORY — PX: CATARACT EXTRACTION: SUR2

## 2018-09-09 ENCOUNTER — Ambulatory Visit: Payer: Medicare HMO | Admitting: Medical

## 2018-09-11 ENCOUNTER — Ambulatory Visit (INDEPENDENT_AMBULATORY_CARE_PROVIDER_SITE_OTHER): Payer: Medicare HMO | Admitting: Family Medicine

## 2018-09-11 ENCOUNTER — Encounter: Payer: Self-pay | Admitting: Family Medicine

## 2018-09-11 VITALS — BP 132/70 | HR 85 | Temp 98.5°F | Resp 16 | Ht 64.0 in | Wt 123.2 lb

## 2018-09-11 DIAGNOSIS — I10 Essential (primary) hypertension: Secondary | ICD-10-CM

## 2018-09-11 DIAGNOSIS — E785 Hyperlipidemia, unspecified: Secondary | ICD-10-CM | POA: Diagnosis not present

## 2018-09-11 DIAGNOSIS — E1151 Type 2 diabetes mellitus with diabetic peripheral angiopathy without gangrene: Secondary | ICD-10-CM | POA: Diagnosis not present

## 2018-09-11 DIAGNOSIS — E1169 Type 2 diabetes mellitus with other specified complication: Secondary | ICD-10-CM | POA: Diagnosis not present

## 2018-09-11 DIAGNOSIS — IMO0002 Reserved for concepts with insufficient information to code with codable children: Secondary | ICD-10-CM

## 2018-09-11 DIAGNOSIS — E1165 Type 2 diabetes mellitus with hyperglycemia: Secondary | ICD-10-CM | POA: Diagnosis not present

## 2018-09-11 LAB — LIPID PANEL
CHOL/HDL RATIO: 4
CHOLESTEROL: 236 mg/dL — AB (ref 0–200)
HDL: 58.4 mg/dL (ref >39.00)
LDL CALC: 146 mg/dL — AB (ref 0–99)
NonHDL: 177.37
Triglycerides: 156 mg/dL — ABNORMAL HIGH (ref 0.0–149.0)
VLDL: 31.2 mg/dL (ref 0.0–40.0)

## 2018-09-11 LAB — COMPREHENSIVE METABOLIC PANEL
ALT: 26 U/L (ref 0–35)
AST: 18 U/L (ref 0–37)
Albumin: 3.5 g/dL (ref 3.5–5.2)
Alkaline Phosphatase: 121 U/L — ABNORMAL HIGH (ref 39–117)
BUN: 22 mg/dL (ref 6–23)
CHLORIDE: 108 meq/L (ref 96–112)
CO2: 29 mEq/L (ref 19–32)
Calcium: 10.1 mg/dL (ref 8.4–10.5)
Creatinine, Ser: 1.17 mg/dL (ref 0.40–1.20)
GFR: 47.68 mL/min — ABNORMAL LOW (ref >60.00)
GLUCOSE: 230 mg/dL — AB (ref 70–99)
POTASSIUM: 5.4 meq/L — AB (ref 3.5–5.1)
SODIUM: 145 meq/L (ref 135–145)
TOTAL PROTEIN: 6.1 g/dL (ref 6.0–8.3)
Total Bilirubin: 0.5 mg/dL (ref 0.2–1.2)

## 2018-09-11 LAB — HEMOGLOBIN A1C: HEMOGLOBIN A1C: 8.3 % — AB (ref 4.6–6.5)

## 2018-09-11 NOTE — Progress Notes (Signed)
Patient ID: Brandi Mccormick, female    DOB: 01-03-1941  Age: 78 y.o. MRN: [id number]    Subjective:  Subjective  HPI Brandi Mccormick presents for f/u bp , chol  And dm.  HYPERTENSION   Blood pressure range-running good per pt  Chest pain- no      Dyspnea- no Medication compliance: good Low salt diet- yes    DIABETES    Blood Sugar ranges-115 this am   Polyuria- no New Visual problems- no  Hypoglycemic symptoms- no  Other side effects-no Medication compliance - good Last eye exam- 10/2017 Foot exam- today   HYPERLIPIDEMIA  Medication compliance-yes RUQ pain- no  Muscle aches- no Other side effects-no      Review of Systems  Constitutional: Negative for chills and fever.  HENT: Negative for congestion and hearing loss.   Eyes: Negative for discharge.  Respiratory: Negative for cough and shortness of breath.   Cardiovascular: Negative for chest pain, palpitations and leg swelling.  Gastrointestinal: Negative for abdominal pain, blood in stool, constipation, diarrhea, nausea and vomiting.  Genitourinary: Negative for dysuria, frequency, hematuria and urgency.  Musculoskeletal: Negative for back pain and myalgias.  Skin: Negative for rash.  Allergic/Immunologic: Negative for environmental allergies.  Neurological: Negative for dizziness, weakness and headaches.  Hematological: Does not bruise/bleed easily.  Psychiatric/Behavioral: Negative for suicidal ideas. The patient is not nervous/anxious.     History Past Medical History:  Diagnosis Date  . Diabetes ([place])   . GERD (gastroesophageal reflux disease)   . Hyperlipidemia   . Hypertension   . Hyperthyroidism   . Pneumonia   . Shingles     She has a past surgical history that includes Dental surgery and Colonoscopy.   Her family history includes Colon cancer in her mother; Diabetes in her brother, father, mother, sister, and sister; Stroke in her mother.She reports that she has never smoked. She has never  used smokeless tobacco. She reports current alcohol use. She reports that she does not use drugs.  Current Outpatient Medications on File Prior to Visit  Medication Sig Dispense Refill  . amLODipine (NORVASC) 10 MG tablet Take 1 tablet (10 mg total) by mouth daily. 30 tablet 0  . aspirin EC 81 MG tablet Take 81 mg by mouth daily.    Marland Kitchen atorvastatin (LIPITOR) 40 MG tablet TAKE 1 TABLET BY MOUTH ONCE DAILY (Patient taking differently: Take 40 mg by mouth daily. ) 90 tablet 1  . famotidine (PEPCID) 20 MG tablet Take 20 mg by mouth daily.    . furosemide (LASIX) 20 MG tablet Take 20 mg by mouth 2 (two) times daily.    . hydroxyurea (HYDREA) 500 MG capsule Take 1 capsule 2 times daily.  May take with food to minimize GI side effects. 60 capsule 2  . lisinopril (PRINIVIL,ZESTRIL) 40 MG tablet Take 1 tablet (40 mg total) by mouth daily. 90 tablet 1  . metFORMIN (GLUCOPHAGE-XR) 750 MG 24 hr tablet Take 1 tablet (750 mg total) by mouth daily with supper. 90 tablet 3  . ONETOUCH DELICA LANCETS 32K MISC OneTouch Delica Lancets 33 gauge    . ONETOUCH VERIO test strip USE 1 STRIP TO CHECK GLUCOSE ONCE DAILY 50 each 7   Current Facility-Administered Medications on File Prior to Visit  Medication Dose Route Frequency Provider Last Rate Last Dose  . 0.9 %  sodium chloride infusion  500 mL Intravenous Continuous Armbruster, Carlota Raspberry, MD         Objective:  Objective  Physical Exam Vitals signs and nursing note reviewed.  Constitutional:      Appearance: She is well-developed.  HENT:     Head: Normocephalic and atraumatic.  Eyes:     Conjunctiva/sclera: Conjunctivae normal.  Neck:     Musculoskeletal: Normal range of motion and neck supple.     Thyroid: No thyromegaly.     Vascular: No carotid bruit or JVD.  Cardiovascular:     Rate and Rhythm: Normal rate and regular rhythm.     Heart sounds: Normal heart sounds. No murmur.  Pulmonary:     Effort: Pulmonary effort is normal. No respiratory  distress.     Breath sounds: Normal breath sounds. No wheezing or rales.  Chest:     Chest wall: No tenderness.  Neurological:     Mental Status: She is alert and oriented to person, place, and time.   . Diabetic Foot Exam - Simple   Simple Foot Form Diabetic Foot exam was performed with the following findings:  Yes 09/11/2018  1:40 PM  Visual Inspection No deformities, no ulcerations, no other skin breakdown bilaterally:  Yes Sensation Testing Intact to touch and monofilament testing bilaterally:  Yes Pulse Check Posterior Tibialis and Dorsalis pulse intact bilaterally:  Yes Comments     BP 132/70 (BP Location: Left Arm, Cuff Size: Normal)   Pulse 85   Temp 98.5 F (36.9 C) (Oral)   Resp 16   Ht 5\' 4"  (1.626 m)   Wt 123 lb 3.2 oz (55.9 kg)   SpO2 98%   BMI 21.15 kg/m  Wt Readings from Last 3 Encounters:  09/11/18 123 lb 3.2 oz (55.9 kg)  08/31/18 126 lb 3.2 oz (57.2 kg)  08/17/18 124 lb 14.4 oz (56.7 kg)     Lab Results  Component Value Date   WBC 6.3 08/31/2018   HGB 11.8 (L) 08/31/2018   HCT 35.6 (L) 08/31/2018   PLT 513 (H) 08/31/2018   GLUCOSE 177 (H) 07/27/2018   CHOL 328 (H) 04/02/2018   TRIG 105.0 04/02/2018   HDL 69.50 04/02/2018   LDLCALC 237 (H) 04/02/2018   ALT 16 07/27/2018   AST 19 07/27/2018   NA 141 07/27/2018   K 3.8 07/27/2018   CL 107 07/27/2018   CREATININE 1.34 (H) 07/27/2018   BUN 17 07/27/2018   CO2 26 07/27/2018   TSH 0.635 06/26/2018   INR 0.97 06/02/2018   HGBA1C 6.9 (A) 06/04/2018   MICROALBUR 410.0 (H) 04/02/2018    No results found.   Assessment & Plan:  Plan  I am having Brandi Mccormick maintain her ONETOUCH VERIO, atorvastatin, ONETOUCH DELICA LANCETS 29F, furosemide, aspirin EC, metFORMIN, hydroxyurea, lisinopril, famotidine, and amLODipine. We will continue to administer sodium chloride.  No orders of the defined types were placed in this encounter.   Problem List Items Addressed This Visit      Unprioritized     DM (diabetes mellitus) type II uncontrolled, periph vascular disorder ([place]) - Primary    hgba1c to be checked, minimize simple carbs. Increase exercise as tolerated. Continue current meds       Relevant Orders   Hemoglobin A1c   Comprehensive metabolic panel   Essential hypertension    Well controlled, no changes to meds. Encouraged heart healthy diet such as the DASH diet and exercise as tolerated.       Relevant Orders   Comprehensive metabolic panel   Hyperlipidemia associated with type 2 diabetes mellitus ([place])   Relevant Orders  Lipid panel   Comprehensive metabolic panel   Hyperlipidemia LDL goal <70    Encouraged heart healthy diet, increase exercise, avoid trans fats, consider a krill oil cap daily         Follow-up: Return in about 6 months (around 03/12/2019) for annual exam, fasting.  [name], DO

## 2018-09-11 NOTE — Assessment & Plan Note (Signed)
hgba1c to be checked, minimize simple carbs. Increase exercise as tolerated. Continue current meds  

## 2018-09-11 NOTE — Assessment & Plan Note (Signed)
Well controlled, no changes to meds. Encouraged heart healthy diet such as the DASH diet and exercise as tolerated.  °

## 2018-09-11 NOTE — Assessment & Plan Note (Signed)
Encouraged heart healthy diet, increase exercise, avoid trans fats, consider a krill oil cap daily 

## 2018-09-11 NOTE — Patient Instructions (Signed)
Carbohydrate Counting for Diabetes Mellitus, Adult  Carbohydrate counting is a method of keeping track of how many carbohydrates you eat. Eating carbohydrates naturally increases the amount of sugar (glucose) in the blood. Counting how many carbohydrates you eat helps keep your blood glucose within normal limits, which helps you manage your diabetes (diabetes mellitus). It is important to know how many carbohydrates you can safely have in each meal. This is different for every person. A diet and nutrition specialist (registered dietitian) can help you make a meal plan and calculate how many carbohydrates you should have at each meal and snack. Carbohydrates are found in the following foods:  Grains, such as breads and cereals.  Dried beans and soy products.  Starchy vegetables, such as potatoes, peas, and corn.  Fruit and fruit juices.  Milk and yogurt.  Sweets and snack foods, such as cake, cookies, candy, chips, and soft drinks. How do I count carbohydrates? There are two ways to count carbohydrates in food. You can use either of the methods or a combination of both. Reading "Nutrition Facts" on packaged food The "Nutrition Facts" list is included on the labels of almost all packaged foods and beverages in the [place] It includes:  The serving size.  Information about nutrients in each serving, including the grams (g) of carbohydrate per serving. To use the "Nutrition Facts":  Decide how many servings you will have.  Multiply the number of servings by the number of carbohydrates per serving.  The resulting number is the total amount of carbohydrates that you will be having. Learning standard serving sizes of other foods When you eat carbohydrate foods that are not packaged or do not include "Nutrition Facts" on the label, you need to measure the servings in order to count the amount of carbohydrates:  Measure the foods that you will eat with a food scale or measuring cup, if needed.   Decide how many standard-size servings you will eat.  Multiply the number of servings by 15. Most carbohydrate-rich foods have about 15 g of carbohydrates per serving. ? For example, if you eat 8 oz (170 g) of strawberries, you will have eaten 2 servings and 30 g of carbohydrates (2 servings x 15 g = 30 g).  For foods that have more than one food mixed, such as soups and casseroles, you must count the carbohydrates in each food that is included. The following list contains standard serving sizes of common carbohydrate-rich foods. Each of these servings has about 15 g of carbohydrates:   hamburger bun or  English muffin.   oz (15 mL) syrup.   oz (14 g) jelly.  1 slice of bread.  1 six-inch tortilla.  3 oz (85 g) cooked rice or pasta.  4 oz (113 g) cooked dried beans.  4 oz (113 g) starchy vegetable, such as peas, corn, or potatoes.  4 oz (113 g) hot cereal.  4 oz (113 g) mashed potatoes or  of a large baked potato.  4 oz (113 g) canned or frozen fruit.  4 oz (120 mL) fruit juice.  4-6 crackers.  6 chicken nuggets.  6 oz (170 g) unsweetened dry cereal.  6 oz (170 g) plain fat-free yogurt or yogurt sweetened with artificial sweeteners.  8 oz (240 mL) milk.  8 oz (170 g) fresh fruit or one small piece of fruit.  24 oz (680 g) popped popcorn. Example of carbohydrate counting Sample meal  3 oz (85 g) chicken breast.  6 oz (170 g)   brown rice.  4 oz (113 g) corn.  8 oz (240 mL) milk.  8 oz (170 g) strawberries with sugar-free whipped topping. Carbohydrate calculation 1. Identify the foods that contain carbohydrates: ? Rice. ? Corn. ? Milk. ? Strawberries. 2. Calculate how many servings you have of each food: ? 2 servings rice. ? 1 serving corn. ? 1 serving milk. ? 1 serving strawberries. 3. Multiply each number of servings by 15 g: ? 2 servings rice x 15 g = 30 g. ? 1 serving corn x 15 g = 15 g. ? 1 serving milk x 15 g = 15 g. ? 1 serving  strawberries x 15 g = 15 g. 4. Add together all of the amounts to find the total grams of carbohydrates eaten: ? 30 g + 15 g + 15 g + 15 g = 75 g of carbohydrates total. Summary  Carbohydrate counting is a method of keeping track of how many carbohydrates you eat.  Eating carbohydrates naturally increases the amount of sugar (glucose) in the blood.  Counting how many carbohydrates you eat helps keep your blood glucose within normal limits, which helps you manage your diabetes.  A diet and nutrition specialist (registered dietitian) can help you make a meal plan and calculate how many carbohydrates you should have at each meal and snack. This information is not intended to replace advice given to you by your health care provider. Make sure you discuss any questions you have with your health care provider. Document Released: 08/19/2005 Document Revised: 02/26/2017 Document Reviewed: 01/31/2016 Elsevier Interactive Patient Education  2019 [employer]  

## 2018-09-12 ENCOUNTER — Other Ambulatory Visit: Payer: Self-pay | Admitting: Family Medicine

## 2018-09-12 DIAGNOSIS — E785 Hyperlipidemia, unspecified: Secondary | ICD-10-CM

## 2018-09-12 DIAGNOSIS — E1169 Type 2 diabetes mellitus with other specified complication: Secondary | ICD-10-CM

## 2018-09-12 DIAGNOSIS — E1165 Type 2 diabetes mellitus with hyperglycemia: Secondary | ICD-10-CM

## 2018-09-14 DIAGNOSIS — R69 Illness, unspecified: Secondary | ICD-10-CM | POA: Diagnosis not present

## 2018-09-19 DIAGNOSIS — R69 Illness, unspecified: Secondary | ICD-10-CM | POA: Diagnosis not present

## 2018-09-21 ENCOUNTER — Encounter: Payer: Self-pay | Admitting: Nurse Practitioner

## 2018-09-21 ENCOUNTER — Telehealth: Payer: Self-pay | Admitting: Nurse Practitioner

## 2018-09-21 ENCOUNTER — Inpatient Hospital Stay: Payer: Medicare HMO | Attending: Hematology and Oncology

## 2018-09-21 ENCOUNTER — Inpatient Hospital Stay (HOSPITAL_BASED_OUTPATIENT_CLINIC_OR_DEPARTMENT_OTHER): Payer: Medicare HMO | Admitting: Nurse Practitioner

## 2018-09-21 ENCOUNTER — Other Ambulatory Visit: Payer: Self-pay | Admitting: *Deleted

## 2018-09-21 VITALS — BP 139/67 | HR 74 | Temp 97.5°F | Resp 17 | Ht 64.0 in | Wt 123.7 lb

## 2018-09-21 DIAGNOSIS — D471 Chronic myeloproliferative disease: Secondary | ICD-10-CM

## 2018-09-21 DIAGNOSIS — E119 Type 2 diabetes mellitus without complications: Secondary | ICD-10-CM | POA: Insufficient documentation

## 2018-09-21 DIAGNOSIS — D45 Polycythemia vera: Secondary | ICD-10-CM | POA: Insufficient documentation

## 2018-09-21 DIAGNOSIS — Z79899 Other long term (current) drug therapy: Secondary | ICD-10-CM | POA: Diagnosis not present

## 2018-09-21 DIAGNOSIS — I10 Essential (primary) hypertension: Secondary | ICD-10-CM | POA: Diagnosis not present

## 2018-09-21 LAB — CBC WITH DIFFERENTIAL (CANCER CENTER ONLY)
Abs Immature Granulocytes: 0.05 10*3/uL (ref 0.00–0.07)
BASOS ABS: 0.1 10*3/uL (ref 0.0–0.1)
Basophils Relative: 1 %
EOS PCT: 2 %
Eosinophils Absolute: 0.1 10*3/uL (ref 0.0–0.5)
HCT: 39 % (ref 36.0–46.0)
Hemoglobin: 13.1 g/dL (ref 12.0–15.0)
Immature Granulocytes: 1 %
Lymphocytes Relative: 27 %
Lymphs Abs: 2.5 10*3/uL (ref 0.7–4.0)
MCH: 29.8 pg (ref 26.0–34.0)
MCHC: 33.6 g/dL (ref 30.0–36.0)
MCV: 88.6 fL (ref 80.0–100.0)
MONO ABS: 0.9 10*3/uL (ref 0.1–1.0)
Monocytes Relative: 9 %
Neutro Abs: 5.7 10*3/uL (ref 1.7–7.7)
Neutrophils Relative %: 60 %
Platelet Count: 785 10*3/uL — ABNORMAL HIGH (ref 150–400)
RBC: 4.4 MIL/uL (ref 3.87–5.11)
WBC: 9.3 10*3/uL (ref 4.0–10.5)
nRBC: 0 % (ref 0.0–0.2)

## 2018-09-21 MED ORDER — SITAGLIP PHOS-METFORMIN HCL ER 100-1000 MG PO TB24: 1.0000 | ORAL_TABLET | Freq: Every day | ORAL | 2 refills | Status: DC

## 2018-09-21 MED ORDER — ROSUVASTATIN CALCIUM 40 MG PO TABS: 40.0000 mg | ORAL_TABLET | Freq: Every day | ORAL | 2 refills | Status: DC

## 2018-09-21 NOTE — Progress Notes (Signed)
  [facility] OFFICE PROGRESS NOTE   Diagnosis: Polycythemia vera  INTERVAL HISTORY:   Ms. Eddington returns as scheduled.  Hydrea was resumed at a dose of 500 mg every other day 08/31/2018.  She denies nausea/vomiting.  No mouth sores.  No diarrhea.  No rash.  No symptoms of thrombosis.  Objective:  Vital signs in last 24 hours:  Blood pressure 139/67, pulse 74, temperature (!) 97.5 F (36.4 C), temperature source Oral, resp. rate 17, height 5\' 4"  (1.626 m), weight 123 lb 11.2 oz (56.1 kg), SpO2 100 %.    HEENT: No thrush or ulcers. Resp: Lungs clear bilaterally. Cardio: Regular rate and rhythm. GI: Abdomen soft and nontender.  No hepatosplenomegaly. Vascular: No leg edema.  Skin: No rash.   Lab Results:  Lab Results  Component Value Date   WBC 9.3 09/21/2018   HGB 13.1 09/21/2018   HCT 39.0 09/21/2018   MCV 88.6 09/21/2018   PLT 785 (H) 09/21/2018   NEUTROABS 5.7 09/21/2018    Imaging:  No results found.  Medications: I have reviewed the patient's current medications.  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 Type 2 diabetes 3. Hypertension 4. Focal/segmental glomerular sclerosis  Disposition: Ms. Brabant appears stable.  We reviewed the CBC from today.  She will increase hydroxyurea from 500 mg every other day to 500 mg daily.  She will return for a follow-up CBC in 3 weeks.  She will return for lab and follow-up in 6 weeks.  She will contact the office in the interim with any problems.  Plan reviewed with Dr. Benay Spice.    Ned Card ANP/GNP-BC   09/21/2018  11:49 AM

## 2018-09-21 NOTE — Telephone Encounter (Signed)
Printed calendar and avs. °

## 2018-09-30 ENCOUNTER — Other Ambulatory Visit: Payer: Self-pay | Admitting: Family Medicine

## 2018-10-03 IMAGING — CT CT ABD-PELV W/ CM
2 of 5 series · 16 of 46 positions shown, 18 images · IV contrast (Omni 300)
Comparison: 06/24/2008 and MRI abdomen/kidney 04/26/2017

CLINICAL DATA: Left-sided abdominal/groin pain post renal biopsy
earlier today. Abdominal distension post procedure.

EXAM:
CT ABDOMEN AND PELVIS WITH CONTRAST
TECHNIQUE: Multidetector CT imaging of the abdomen and pelvis was performed
using the standard protocol following bolus administration of
intravenous contrast.
CONTRAST:  80mL PRD1H0-HOO IOPAMIDOL (PRD1H0-HOO) INJECTION 61%

[Series 3: a/p w/ 5mm · axial · 0.72mm/px · z∈[+745,+1175]mm · 13 of 98 slices shown, 15 images]
[im 6/98  soft-tissue]
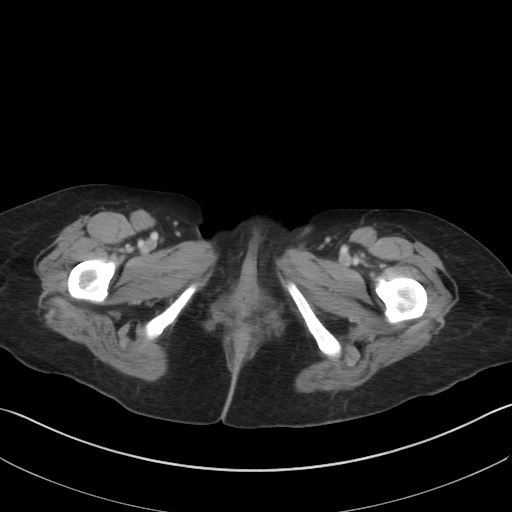
[im 6/98  bone]
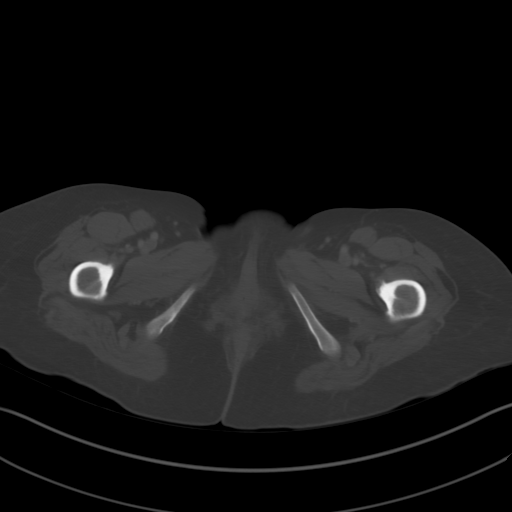
[im 11/98  soft-tissue]
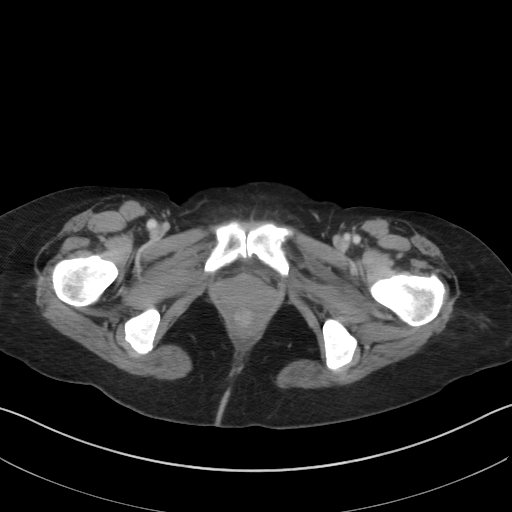
[im 22/98  soft-tissue]
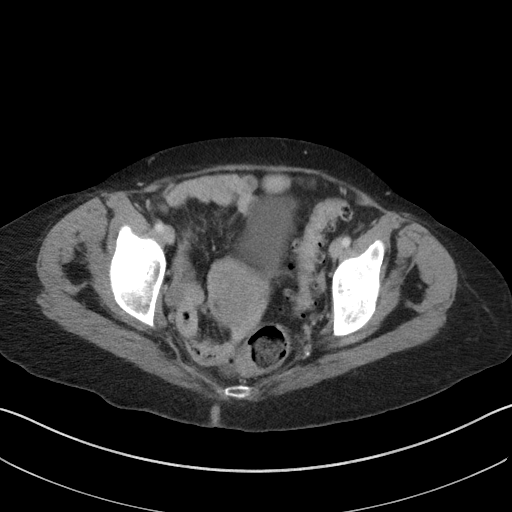
[im 27/98  soft-tissue]
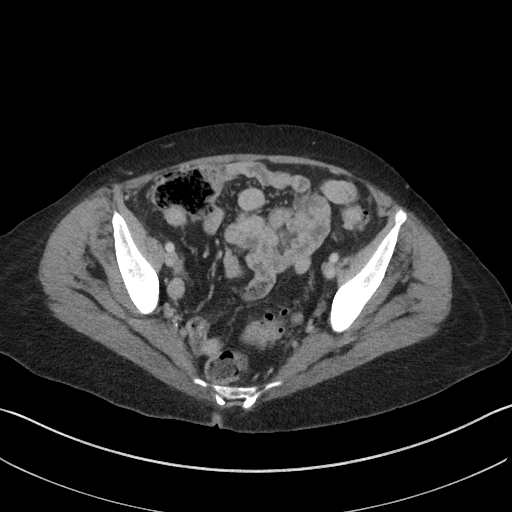
[im 33/98  soft-tissue]
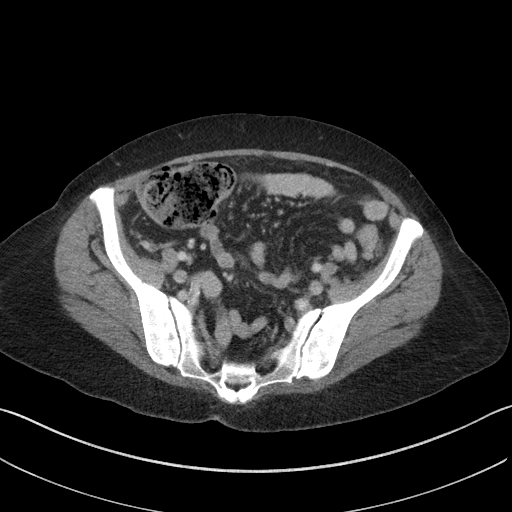
[im 44/98  soft-tissue]
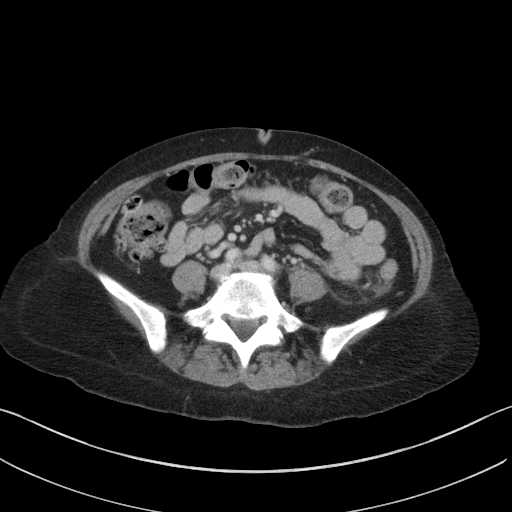
[im 49/98  soft-tissue]
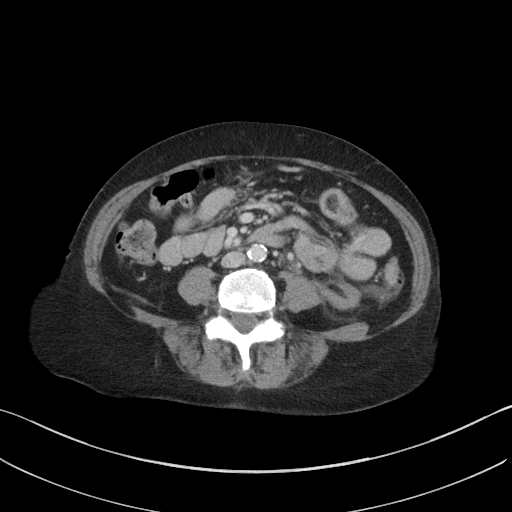
[im 54/98  soft-tissue]
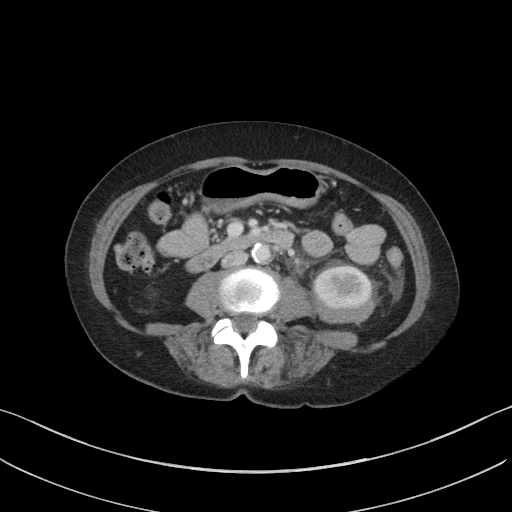
[im 65/98  soft-tissue]
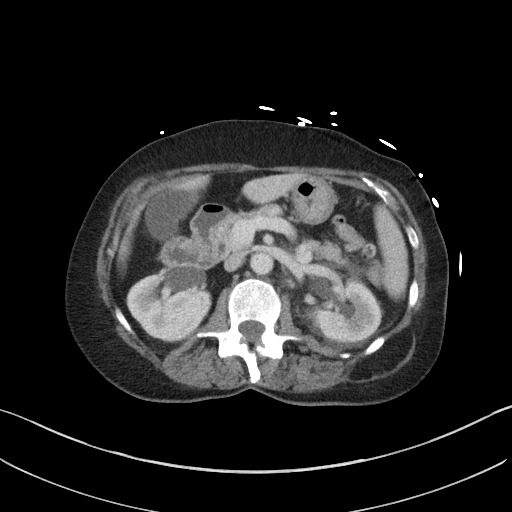
[im 65/98  bone]
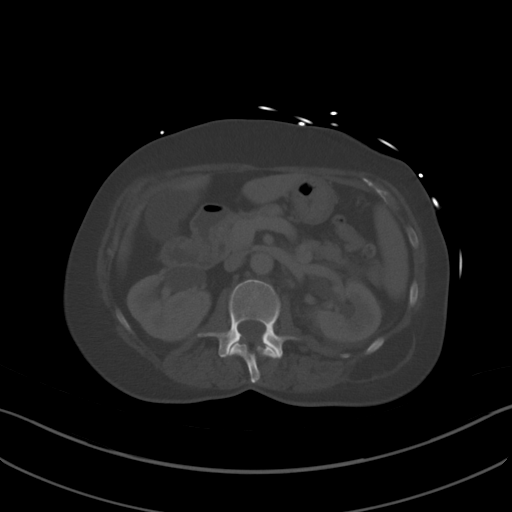
[im 71/98  soft-tissue]
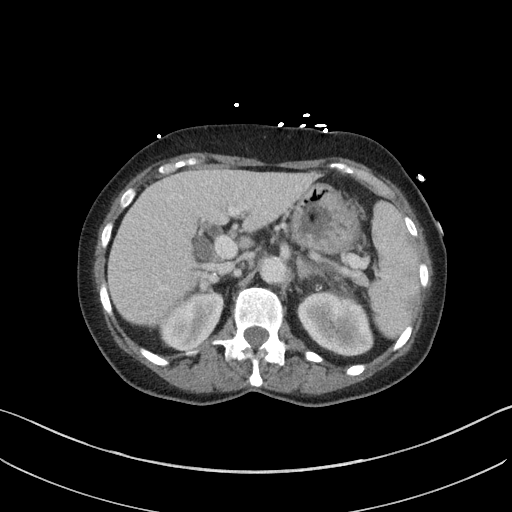
[im 76/98  soft-tissue]
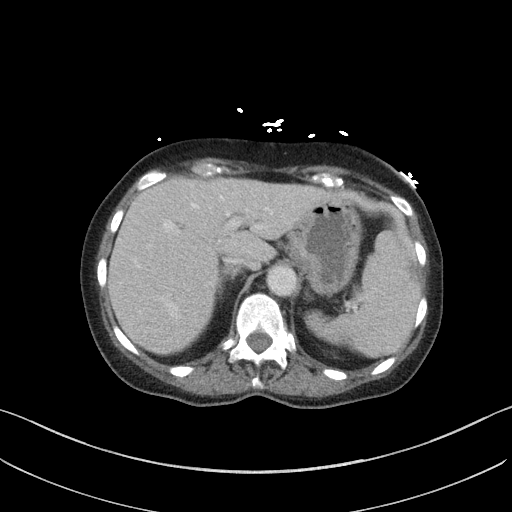
[im 87/98  soft-tissue]
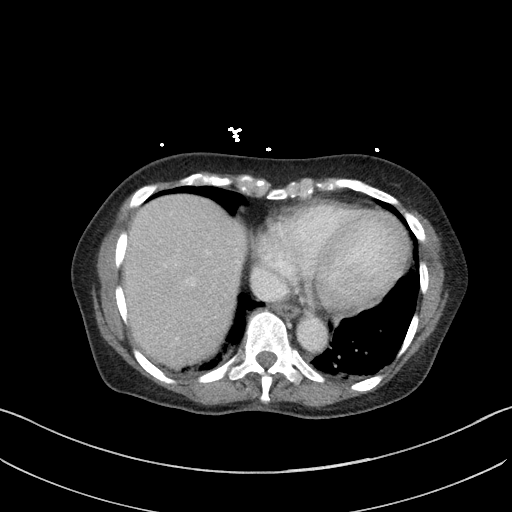
[im 92/98  soft-tissue]
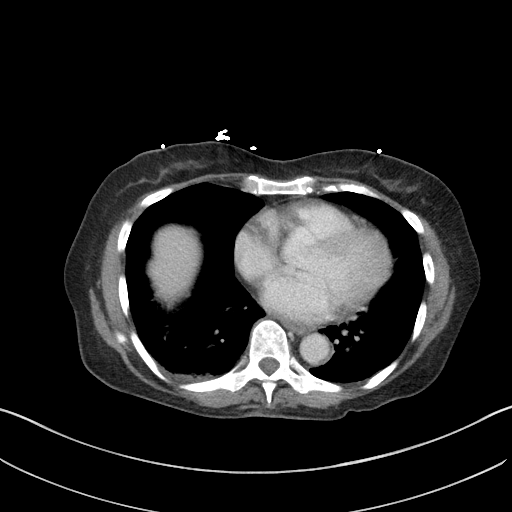

[Series 6: a/p w/ cor · coronal · 0.73mm/px · 3 of 108 slices shown]
[im 36/108  soft-tissue]
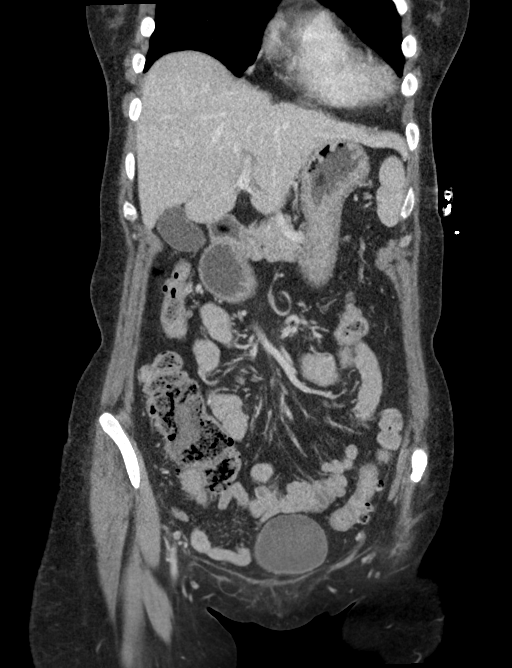
[im 48/108  soft-tissue]
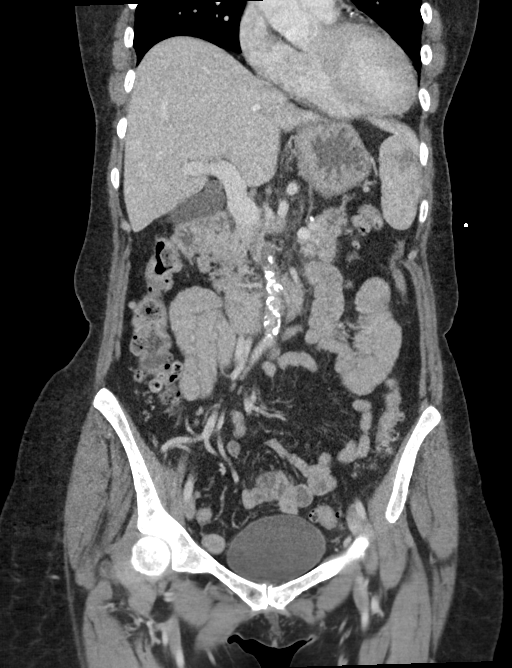
[im 60/108  soft-tissue]
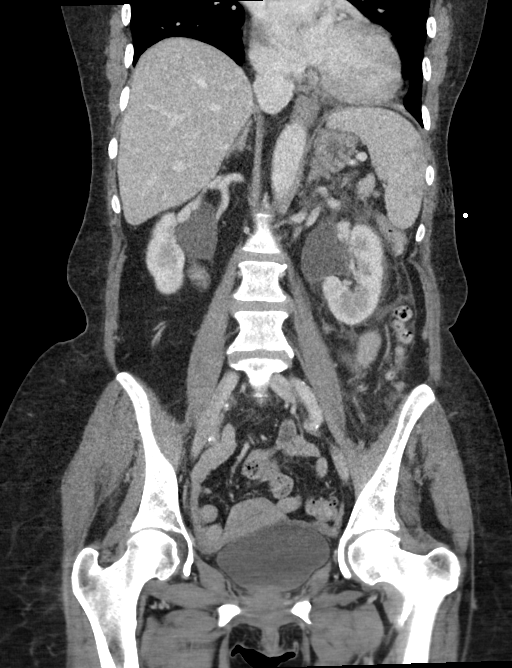

[16 of 46 positions shown; findings below may reference images not displayed]

FINDINGS: Lower chest: Lung bases demonstrate minimal posterior bibasilar
dependent atelectasis. Mild calcified plaque over the left anterior
descending coronary artery.

Hepatobiliary: Gallbladder, liver and biliary tree are normal.

Pancreas: Normal.

Spleen: Within normal.

Adrenals/Urinary Tract: Adrenal glands are normal. Kidneys are
normal in size without evidence of nephrolithiasis. Kidneys are well
opacified. Mild prominence of the renal pelvis bilaterally left
worse than right. There is minimal left perinephric hemorrhage
adjacent the lower pole compatible with percutaneous
ultrasound-guided biopsy earlier today. This rind of hemorrhage
measures 1.6 cm in thickness at the level of the lower pole on the
axial images. No significant subcapsular collection. Ureters and
bladder are normal.

Stomach/Bowel: Stomach and small bowel are normal. Appendix is
normal. There is diverticulosis throughout the colon most notable
over the sigmoid colon.

Vascular/Lymphatic: Mild calcified plaque over the abdominal aorta.
Vascular structures are otherwise unremarkable. No adenopathy.

Reproductive: 1.4 cm fat containing mass over the uterine fundus
possibly a small lipoma. Ovaries are within normal.

Other: Small amount of free fluid in the pelvis.

Musculoskeletal: Minimal degenerative change over the lower lumbar
spine and hips.
IMPRESSION: Small amount of left perinephric hemorrhage adjacent the lower pole
compatible with recent ultrasound-guided percutaneous biopsy earlier
today.

Colonic diverticulosis.

Aortic Atherosclerosis (1ULQ1-XDS.S).

1.4 cm fatty nodule over the uterine fundus possibly a lipoma.

Subtle atherosclerotic coronary artery disease. Mild bibasilar
atelectasis.

## 2018-10-05 ENCOUNTER — Ambulatory Visit: Payer: Medicare HMO | Admitting: Internal Medicine

## 2018-10-05 ENCOUNTER — Encounter: Payer: Self-pay | Admitting: Internal Medicine

## 2018-10-05 VITALS — BP 120/60 | HR 78 | Ht 64.0 in | Wt 123.0 lb

## 2018-10-05 DIAGNOSIS — E785 Hyperlipidemia, unspecified: Secondary | ICD-10-CM | POA: Diagnosis not present

## 2018-10-05 DIAGNOSIS — E1169 Type 2 diabetes mellitus with other specified complication: Secondary | ICD-10-CM

## 2018-10-05 DIAGNOSIS — E1151 Type 2 diabetes mellitus with diabetic peripheral angiopathy without gangrene: Secondary | ICD-10-CM | POA: Diagnosis not present

## 2018-10-05 DIAGNOSIS — Z8639 Personal history of other endocrine, nutritional and metabolic disease: Secondary | ICD-10-CM

## 2018-10-05 DIAGNOSIS — E1165 Type 2 diabetes mellitus with hyperglycemia: Secondary | ICD-10-CM | POA: Diagnosis not present

## 2018-10-05 DIAGNOSIS — IMO0002 Reserved for concepts with insufficient information to code with codable children: Secondary | ICD-10-CM

## 2018-10-05 NOTE — Patient Instructions (Signed)
Please continue: - JanuMet XR [phone] mg  - Glipizide XL 5 mg in a.m. and 2.5 mg before dinner  Please bring your sugar log and medicine bottles at next visit.  Please come back for a follow-up appointment in 3 months.

## 2018-10-05 NOTE — Progress Notes (Signed)
Patient ID: Brandi Mccormick, female   DOB: [birth year], 78 y.o.   MRN: [id number]  HPI: Brandi Mccormick is a 78 y.o.-year-old female, returning for f/u for DM2, dx 2013, non-insulin-dependent, uncontrolled, with complications (microalbuminuria) and h/o hyperthyroidism. Last visit 4 months ago.  She had worsening proteinuria at last visit.  She had a kidney biopsy: FSGS. Reviewed nephrology note >> Metformin OK at 750 mg daily.  3 weeks ago, after her HbA1c returned higher, her metformin ER 750 mg was changed to Janumet XR [phone] mg by PCP.  DM2: Hemoglobin A1c levels: Lab Results  Component Value Date   HGBA1C 8.3 (H) 09/11/2018   HGBA1C 6.9 (A) 06/04/2018   HGBA1C 7.6 (A) 02/02/2018   HGBA1C 7.4 10/03/2017   HGBA1C 7.0 (H) 04/18/2017   HGBA1C 6.9 02/14/2017   HGBA1C 7.3 11/15/2016   HGBA1C 6.6 06/28/2016   HGBA1C 7.1 03/28/2016   HGBA1C 7.5 12/29/2015   HGBA1C 6.9 08/30/2015   HGBA1C 6.7 05/02/2015   HGBA1C 6.6 (H) 12/27/2014   HGBA1C 6.7 (H) 09/26/2014   HGBA1C 7.2 (H) 05/23/2014   Pt is on a regimen of: - Metformin ER 750 mg with dinner >> [phone] mg JanuMet XR - Glipizide XL 5 mg in a.m. and 2.5 mg before dinner We tried Tradjenta 5 mg daily in am - too expensive. She was getting diarrhea from Metformin >> now Metformin XR 750 mg daily >> tolerates this well.  Pt checks her sugars -once a day: - am: 109-152 >> 124-157 >> 130s, 140 - 2h after b'fast:134, 153 >>122 >> 114 >> n/c - before lunch: 114-145 >> 126, 135 >> 130-140 - 2h after lunch:  136 >> 93-145 >> n/c >> n/c - before dinner: 85, 109-150 >> 70-146 >> 130-140 - 2h after dinner:145-155 >> 91-157 >> n/c - bedtime: 109-141 >> 98-120 >> 150 - nighttime: n/c >> 161 >> n/c Lowest sugar: 70 >> 80 before lunch; it is unclear at which level she has hypoglycemia awareness. Highest sugar:  157 >> 170.  Meter: OneTouch Verio.  Pt's meals are: - Breakfast: 77 egg, coffee, juice; sometime - Lunch: No Lunch - Dinner:  meat + vegetables >> late dinner during the weekend as her son is cooking and brings her food - Snacks: 1-2 Still drinks Sweet drinks and pepsi.  -+ CKD, last BUN/creatinine:  Lab Results  Component Value Date   BUN 22 09/11/2018   CREATININE 1.17 09/11/2018   She has worsening microalbuminuria: Lab Results  Component Value Date   MICRALBCREAT 422.7 (H) 04/02/2018   MICRALBCREAT 225.8 (H) 11/15/2016   MICRALBCREAT 48.8 (H) 06/28/2016   MICRALBCREAT 9.9 02/21/2014   MICRALBCREAT 8.8 11/16/2013  Previously on irbesartan, stopped due to recall.  Now on Lisinopril 40.  -+ HL; last set of lipids: Lab Results  Component Value Date   CHOL 236 (H) 09/11/2018   HDL 58.40 09/11/2018   LDLCALC 146 (H) 09/11/2018   TRIG 156.0 (H) 09/11/2018   CHOLHDL 4 09/11/2018  Prev. on Simvastatin >> off in 06/2016 b/c fatigue.  She started pravastatin 80 mg daily in 12/2016 >> then on Crestor.  She is now off Zetia.  -Denies numbness and tingling in her feet.  - Last eye exam 12/2017: No DR  She has a h/o hyperthyroidism, previously treated with medication, approximately 20 years ago.  No other history available.  Pt denies: - feeling nodules in neck - hoarseness - dysphagia - choking - SOB with lying down  Reviewed her TFTs: Normal  recently: Lab Results  Component Value Date   TSH 0.635 06/26/2018   TSH 1.11 04/02/2018   TSH 1.13 07/03/2017   TSH 1.12 06/28/2016   TSH 1.29 06/23/2015   TSH 1.44 10/18/2014   TSH 0.93 08/15/2014   TSH 0.44 02/17/2014   TSH 0.28 (L) 12/20/2013   FREET4 0.93 07/03/2017   FREET4 0.96 06/28/2016   FREET4 0.91 08/15/2014   FREET4 1.00 02/17/2014   FREET4 0.97 12/20/2013   T3FREE 3.2 07/03/2017   T3FREE 3.1 06/28/2016   T3FREE 3.2 08/15/2014   T3FREE 2.6 02/17/2014   T3FREE 2.9 12/20/2013   ROS: Constitutional: no weight gain/no weight loss, no fatigue, no subjective hyperthermia, no subjective hypothermia Eyes: no blurry vision, no  xerophthalmia ENT: no sore throat, + see HPI Cardiovascular: no CP/no SOB/no palpitations/no leg swelling Respiratory: no cough/no SOB/no wheezing Gastrointestinal: no N/no V/no D/no C/no acid reflux Musculoskeletal: no muscle aches/no joint aches Skin: no rashes, no hair loss Neurological: no tremors/no numbness/no tingling/no dizziness  I reviewed pt's medications, allergies, PMH, social hx, family hx, and changes were documented in the history of present illness. Otherwise, unchanged from my initial visit note.  Past Medical History:  Diagnosis Date  . Diabetes ([place])   . GERD (gastroesophageal reflux disease)   . Hyperlipidemia   . Hypertension   . Hyperthyroidism   . Pneumonia   . Shingles    Past Surgical History:  Procedure Laterality Date  . COLONOSCOPY    . DENTAL SURGERY     Social History   Socioeconomic History  . Marital status: Married    Spouse name: Not on file  . Number of children: Not on file  . Years of education: Not on file  . Highest education level: Not on file  Occupational History  . 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 Financial resource strain: Not on file  . Food insecurity:    Worry: Not on file    Inability: Not on file  . Transportation needs:    Medical: Not on file    Non-medical: Not on file  Tobacco Use  . Smoking status: Never Smoker  . Smokeless tobacco: Never Used  Substance and Sexual Activity  . Alcohol use: Yes    Alcohol/week: 0.0 standard drinks    Comment: occasional wine cooler  . Drug use: No  . Sexual activity: Never    Partners: Male  Lifestyle  . Physical activity:    Days per week: Not on file    Minutes per session: Not on file  . Stress: Not on file  Relationships  . Social connections:    Talks on phone: Not on file    Gets together: Not on file    Attends religious service: Not on file    Active member of club or organization: Not on file    Attends meetings of clubs or  organizations: Not on file    Relationship status: Not on file  . Intimate partner violence:    Fear of current or ex partner: Not on file    Emotionally abused: Not on file    Physically abused: Not on file    Forced sexual activity: Not on file  Other Topics Concern  . Not on file  Social History Narrative   Exercise-- yard work,  Walking in [employer]   Current Outpatient Medications on File Prior to Visit  Medication Sig Dispense Refill  . amLODipine (NORVASC) 10 MG tablet TAKE 1  TABLET BY MOUTH ONCE DAILY 90 tablet 1  . aspirin EC 81 MG tablet Take 81 mg by mouth daily.    Marland Kitchen atorvastatin (LIPITOR) 40 MG tablet TAKE 1 TABLET BY MOUTH ONCE DAILY (Patient taking differently: Take 40 mg by mouth daily. ) 90 tablet 1  . famotidine (PEPCID) 20 MG tablet Take 20 mg by mouth daily.    . furosemide (LASIX) 20 MG tablet Take 20 mg by mouth 2 (two) times daily.    Marland Kitchen lisinopril (PRINIVIL,ZESTRIL) 40 MG tablet Take 1 tablet (40 mg total) by mouth daily. 90 tablet 1  . metFORMIN (GLUCOPHAGE-XR) 750 MG 24 hr tablet Take 1 tablet (750 mg total) by mouth daily with supper. 90 tablet 3  . ONETOUCH DELICA LANCETS 25E MISC OneTouch Delica Lancets 33 gauge    . ONETOUCH VERIO test strip USE 1 STRIP TO CHECK GLUCOSE ONCE DAILY 50 each 7  . rosuvastatin (CRESTOR) 40 MG tablet Take 1 tablet (40 mg total) by mouth daily. 30 tablet 2  . SitaGLIPtin-MetFORMIN HCl (JANUMET XR) [phone] MG TB24 Take 1 tablet by mouth daily. 30 tablet 2   Current Facility-Administered Medications on File Prior to Visit  Medication Dose Route Frequency Provider Last Rate Last Dose  . 0.9 %  sodium chloride infusion  500 mL Intravenous Continuous Armbruster, Carlota Raspberry, MD       Allergies  Allergen Reactions  . Penicillins Nausea And Vomiting    Has patient had a PCN reaction causing immediate rash, facial/tongue/throat swelling, SOB or lightheadedness with hypotension: no Has patient had a PCN reaction causing severe rash  involving mucus membranes or skin necrosis: no Has patient had a PCN reaction that required hospitalization: no Has patient had a PCN reaction occurring within the last 10 years: no If all of the above answers are "NO", then may proceed with Cephalosporin use.    Family History  Problem Relation Age of Onset  . Diabetes Sister   . Diabetes Brother   . Diabetes Sister   . Diabetes Mother        and on mother's side of family  . Colon cancer Mother   . Stroke Mother   . Diabetes Father        and on father's side of family  . Heart disease Neg Hx   . Rectal cancer Neg Hx   . Stomach cancer Neg Hx     PE: BP 120/60   Pulse 78   Ht 5\' 4"  (1.626 m) Comment: measured  Wt 123 lb (55.8 kg)   SpO2 94%   BMI 21.11 kg/m  Body mass index is 21.11 kg/m. Wt Readings from Last 3 Encounters:  10/05/18 123 lb (55.8 kg)  09/21/18 123 lb 11.2 oz (56.1 kg)  09/11/18 123 lb 3.2 oz (55.9 kg)   Constitutional: Normal weight, in NAD Eyes: PERRLA, EOMI, no exophthalmos ENT: moist mucous membranes, no thyromegaly, no cervical lymphadenopathy Cardiovascular: RRR, No MRG Respiratory: CTA B Gastrointestinal: abdomen soft, NT, ND, BS+ Musculoskeletal: no deformities, strength intact in all 4 Skin: moist, warm, no rashes Neurological: no tremor with outstretched hands, DTR normal in all 4  ASSESSMENT: 1. DM2, non-insulin-dependent, uncontrolled, with complications - MAU  2. H/o Hyperthyroidism - 20 years ago - was on meds, cannot remember name  3. HL  PLAN:  1. Patient with history of uncontrolled diabetes, with improved control in the last 4 years, only on oral antidiabetic regimen.  However, at last check, her HbA1c increased from 6.9%  to 8.3%.  This was obtained earlier this months. -At last visit, we moved to the metformin with dinner.  We could not increase the dose due to her decreased kidney function.  She has a new diagnosis of FSGS and seeing Dr. Johnney Ou.  She was not started on  prednisone but rituximab is contemplated.  Of note, she is on hydroxyurea for chronic myeloproliferative disease. -At this visit, she is now on Janumet XR instead of metformin ER and her sugars at goal appeared at or close to goal.  She is tolerating the medication well.  She has no history of pancreatitis.  Her GFR is 47, which is above the threshold of 45, below which we need to decrease Januvia dose.  Therefore, we can continue with the same medication for now.  We can also continue glipizide.  She has no lows. - I suggested to:  Patient Instructions  Please continue: - Metformin ER 750 mg with dinner - Glipizide XL 5 mg in a.m. and 2.5 mg before dinner  Please return in 3 months with your sugar log.   - I advised her to checking sugars at different times of the day - check 1x a day, rotating checks.  However, she needs to bring her log at next visit that she cannot remember her values very well.  She also needs to bring her medication bottles that she does not quite know what she is taking. - advised for yearly eye exams >> she is UTD - Return to clinic in 3 mo with sugar log   2. H/o hyperthyroidism -Appears euthyroid -Reviewed most recent TFTs: TSH was normal 06/2018  3. HL - Reviewed latest lipid panel from earlier this month: LDL high, but decreased from before. Lab Results  Component Value Date   CHOL 236 (H) 09/11/2018   HDL 58.40 09/11/2018   LDLCALC 146 (H) 09/11/2018   TRIG 156.0 (H) 09/11/2018   CHOLHDL 4 09/11/2018  -Recently switched to Crestor 40.  She tolerates this without side effects.  Philemon Kingdom, MD PhD [facility]

## 2018-10-12 ENCOUNTER — Inpatient Hospital Stay: Payer: Medicare HMO | Attending: Hematology and Oncology

## 2018-10-12 DIAGNOSIS — D471 Chronic myeloproliferative disease: Secondary | ICD-10-CM

## 2018-10-12 DIAGNOSIS — D45 Polycythemia vera: Secondary | ICD-10-CM | POA: Insufficient documentation

## 2018-10-12 LAB — CBC WITH DIFFERENTIAL (CANCER CENTER ONLY)
Abs Immature Granulocytes: 0.03 10*3/uL (ref 0.00–0.07)
Basophils Absolute: 0 10*3/uL (ref 0.0–0.1)
Basophils Relative: 1 %
Eosinophils Absolute: 0.1 10*3/uL (ref 0.0–0.5)
Eosinophils Relative: 1 %
HCT: 37.1 % (ref 36.0–46.0)
Hemoglobin: 12.4 g/dL (ref 12.0–15.0)
Immature Granulocytes: 0 %
Lymphocytes Relative: 24 %
Lymphs Abs: 1.9 10*3/uL (ref 0.7–4.0)
MCH: 31.1 pg (ref 26.0–34.0)
MCHC: 33.4 g/dL (ref 30.0–36.0)
MCV: 93 fL (ref 80.0–100.0)
Monocytes Absolute: 0.9 10*3/uL (ref 0.1–1.0)
Monocytes Relative: 11 %
NRBC: 0 % (ref 0.0–0.2)
Neutro Abs: 5 10*3/uL (ref 1.7–7.7)
Neutrophils Relative %: 63 %
Platelet Count: 424 10*3/uL — ABNORMAL HIGH (ref 150–400)
RBC: 3.99 MIL/uL (ref 3.87–5.11)
RDW: 26.5 % — ABNORMAL HIGH (ref 11.5–15.5)
WBC Count: 7.9 10*3/uL (ref 4.0–10.5)

## 2018-10-13 ENCOUNTER — Telehealth: Payer: Self-pay | Admitting: *Deleted

## 2018-10-13 NOTE — Telephone Encounter (Signed)
-----   Message from Owens Shark, NP sent at 10/13/2018  8:47 AM EST ----- Please instruct her to continue the same dose of Hydrea.  Follow-up as scheduled.

## 2018-10-13 NOTE — Telephone Encounter (Signed)
Called patient per Brandi Haw, NP instructed patient to continue same dose of Hydrea, patient verbalized understanding.  Next follow up 11/02/2018 lab appointment 1130 am, and 12 n with Benay Spice, MD- patient verbalized understanding.

## 2018-10-22 ENCOUNTER — Other Ambulatory Visit: Payer: Self-pay | Admitting: *Deleted

## 2018-10-22 MED ORDER — ROSUVASTATIN CALCIUM 40 MG PO TABS: 40.0000 mg | ORAL_TABLET | Freq: Every day | ORAL | 1 refills | Status: DC

## 2018-11-02 ENCOUNTER — Telehealth: Payer: Self-pay | Admitting: Oncology

## 2018-11-02 ENCOUNTER — Inpatient Hospital Stay (HOSPITAL_BASED_OUTPATIENT_CLINIC_OR_DEPARTMENT_OTHER): Payer: Medicare HMO | Admitting: Oncology

## 2018-11-02 ENCOUNTER — Other Ambulatory Visit: Payer: Self-pay

## 2018-11-02 ENCOUNTER — Inpatient Hospital Stay: Payer: Medicare HMO | Attending: Hematology and Oncology

## 2018-11-02 VITALS — BP 102/59 | HR 82

## 2018-11-02 DIAGNOSIS — E119 Type 2 diabetes mellitus without complications: Secondary | ICD-10-CM | POA: Diagnosis not present

## 2018-11-02 DIAGNOSIS — D45 Polycythemia vera: Secondary | ICD-10-CM | POA: Insufficient documentation

## 2018-11-02 DIAGNOSIS — I10 Essential (primary) hypertension: Secondary | ICD-10-CM | POA: Diagnosis not present

## 2018-11-02 DIAGNOSIS — Z79899 Other long term (current) drug therapy: Secondary | ICD-10-CM

## 2018-11-02 DIAGNOSIS — D471 Chronic myeloproliferative disease: Secondary | ICD-10-CM

## 2018-11-02 LAB — CBC WITH DIFFERENTIAL (CANCER CENTER ONLY)
Abs Immature Granulocytes: 0.03 10*3/uL (ref 0.00–0.07)
Basophils Absolute: 0 10*3/uL (ref 0.0–0.1)
Basophils Relative: 0 %
Eosinophils Absolute: 0 10*3/uL (ref 0.0–0.5)
Eosinophils Relative: 1 %
HCT: 35.7 % — ABNORMAL LOW (ref 36.0–46.0)
Hemoglobin: 11.9 g/dL — ABNORMAL LOW (ref 12.0–15.0)
Immature Granulocytes: 0 %
Lymphocytes Relative: 25 %
Lymphs Abs: 2.1 10*3/uL (ref 0.7–4.0)
MCH: 33 pg (ref 26.0–34.0)
MCHC: 33.3 g/dL (ref 30.0–36.0)
MCV: 98.9 fL (ref 80.0–100.0)
Monocytes Absolute: 0.8 10*3/uL (ref 0.1–1.0)
Monocytes Relative: 9 %
Neutro Abs: 5.5 10*3/uL (ref 1.7–7.7)
Neutrophils Relative %: 65 %
Platelet Count: 466 10*3/uL — ABNORMAL HIGH (ref 150–400)
RBC: 3.61 MIL/uL — ABNORMAL LOW (ref 3.87–5.11)
RDW: 22.5 % — ABNORMAL HIGH (ref 11.5–15.5)
WBC Count: 8.4 10*3/uL (ref 4.0–10.5)
nRBC: 0 % (ref 0.0–0.2)

## 2018-11-02 NOTE — Telephone Encounter (Signed)
Gave avs and calendar ° °

## 2018-11-02 NOTE — Progress Notes (Signed)
She takes her blood sugar daily: runs 114-120 Noted orthostatic BP/pulse today and faxed results to Dr. Garnet Koyanagi with note that patient reports feeling lightheaded over past 2 weeks.

## 2018-11-02 NOTE — Progress Notes (Signed)
  [facility] OFFICE PROGRESS NOTE   Diagnosis: Polycythemia vera  INTERVAL HISTORY:   Ms. Bruso returns as scheduled.  She continues hydroxyurea.  No mouth sores.  No focal neurologic symptoms.  She reports feeling "dizzy "for the past several weeks.  She was recently started on metformin and metoprolol.  She ports decreased oral intake.  Objective:  Vital signs in last 24 hours:  There were no vitals taken for this visit.    HEENT: No thrush or ulcers Resp: Lungs clear bilaterally Cardio: Regular rate and rhythm GI: No hepatosplenomegaly Vascular: No leg edema Neuro: Finger-to-nose testing is normal, the motor exam appears intact in the upper and lower extremities bilaterally, she walks with an unsteady gait   Portacath/PICC-without erythema  Lab Results:  Lab Results  Component Value Date   WBC 8.4 11/02/2018   HGB 11.9 (L) 11/02/2018   HCT 35.7 (L) 11/02/2018   MCV 98.9 11/02/2018   PLT 466 (H) 11/02/2018   NEUTROABS 5.5 11/02/2018    CMP  Lab Results  Component Value Date   NA 145 09/11/2018   K 5.4 (H) 09/11/2018   CL 108 09/11/2018   CO2 29 09/11/2018   GLUCOSE 230 (H) 09/11/2018   BUN 22 09/11/2018   CREATININE 1.17 09/11/2018   CALCIUM 10.1 09/11/2018   PROT 6.1 09/11/2018   ALBUMIN 3.5 09/11/2018   AST 18 09/11/2018   ALT 26 09/11/2018   ALKPHOS 121 (H) 09/11/2018   BILITOT 0.5 09/11/2018   GFRNONAA 37 (L) 07/27/2018   GFRAA 43 (L) 07/27/2018    Medications: I have reviewed the patient's current medications.   Assessment/Plan: 1. Polycythemia vera  JAK29mutation (V617F)  Hydroxyurea 500 mg twice daily 06/26/2018  Hydroxyurea decreased to 500 mg daily beginning 07/27/2018  Hydroxyurea placed on hold 08/17/2018  Hydroxyurea resumed at a dose of 500 mg every other day beginning 08/31/2018  Hydrea increased to 500 mg daily beginning 09/21/2018 2. Type 2 diabetes 3. Hypertension 4. Focal/segmental glomerular  sclerosis    Disposition: Ms. Tomczak is stable from a hematologic standpoint.  The platelet count and hemoglobin are in goal range.  She will continue hydroxyurea at the current dose.  She will return for an office and lab visit in 6 weeks.  She reports feeling "dizzy ".  She has orthostasis on vital signs today.  She has lost weight.  We will refer her to Dr. Etter Sjogren to consider altering the antihypertensive and diabetic regimens.  Betsy Coder, MD  11/02/2018  1:05 PM

## 2018-11-03 ENCOUNTER — Encounter: Payer: Self-pay | Admitting: Podiatry

## 2018-11-03 ENCOUNTER — Ambulatory Visit: Payer: Medicare HMO | Admitting: Podiatry

## 2018-11-03 DIAGNOSIS — M79674 Pain in right toe(s): Secondary | ICD-10-CM

## 2018-11-03 DIAGNOSIS — M79675 Pain in left toe(s): Secondary | ICD-10-CM

## 2018-11-03 DIAGNOSIS — B351 Tinea unguium: Secondary | ICD-10-CM

## 2018-11-03 DIAGNOSIS — E119 Type 2 diabetes mellitus without complications: Secondary | ICD-10-CM

## 2018-11-03 NOTE — Patient Instructions (Signed)

## 2018-11-04 ENCOUNTER — Ambulatory Visit: Payer: Self-pay | Admitting: *Deleted

## 2018-11-04 NOTE — Telephone Encounter (Signed)
Patient is calling to report she has been having dizziness for several weeks- she thinks it may be related to her BP. Patient states she gets dizzy with standing and she gets dizzy in the mornings when she gets up.  BP- 124/78 P 85 Patient states she has only taken one of her BP medications this morning.    Reason for Disposition . [1] MODERATE dizziness (e.g., interferes with normal activities) AND [2] has been evaluated by physician for this  Answer Assessment - Initial Assessment Questions 1. DESCRIPTION: "Describe your dizziness."     Sometimes she feels like she can't walk right- weakness in legs 2. LIGHTHEADED: "Do you feel lightheaded?" (e.g., somewhat faint, woozy, weak upon standing)     Weak with standing 3. VERTIGO: "Do you feel like either you or the room is spinning or tilting?" (i.e. vertigo)     no 4. SEVERITY: "How bad is it?"  "Do you feel like you are going to faint?" "Can you stand and walk?"   - MILD - walking normally   - MODERATE - interferes with normal activities (e.g., work, school)    - SEVERE - unable to stand, requires support to walk, feels like passing out now.      moderate 5. ONSET:  "When did the dizziness begin?"     Several weeks 6. AGGRAVATING FACTORS: "Does anything make it worse?" (e.g., standing, change in head position)     Standing, in the mornings when she gets up 7. HEART RATE: "Can you tell me your heart rate?" "How many beats in 15 seconds?"  (Note: not all patients can do this)       Does not notice change in heart rate 8. CAUSE: "What do you think is causing the dizziness?"     medications 9. RECURRENT SYMPTOM: "Have you had dizziness before?" If so, ask: "When was the last time?" "What happened that time?"     no 10. OTHER SYMPTOMS: "Do you have any other symptoms?" (e.g., fever, chest pain, vomiting, diarrhea, bleeding)       no 11. PREGNANCY: "Is there any chance you are pregnant?" "When was your last menstrual period?"        n/a  Protocols used: DIZZINESS [facility]

## 2018-11-05 ENCOUNTER — Telehealth: Payer: Self-pay | Admitting: *Deleted

## 2018-11-05 ENCOUNTER — Encounter: Payer: Self-pay | Admitting: Family Medicine

## 2018-11-05 ENCOUNTER — Ambulatory Visit (INDEPENDENT_AMBULATORY_CARE_PROVIDER_SITE_OTHER): Payer: Medicare HMO | Admitting: Family Medicine

## 2018-11-05 VITALS — BP 118/60 | HR 72 | Ht 64.0 in | Wt 121.0 lb

## 2018-11-05 DIAGNOSIS — E1151 Type 2 diabetes mellitus with diabetic peripheral angiopathy without gangrene: Secondary | ICD-10-CM

## 2018-11-05 DIAGNOSIS — E1165 Type 2 diabetes mellitus with hyperglycemia: Secondary | ICD-10-CM | POA: Diagnosis not present

## 2018-11-05 DIAGNOSIS — R42 Dizziness and giddiness: Secondary | ICD-10-CM | POA: Diagnosis not present

## 2018-11-05 DIAGNOSIS — IMO0002 Reserved for concepts with insufficient information to code with codable children: Secondary | ICD-10-CM

## 2018-11-05 DIAGNOSIS — I10 Essential (primary) hypertension: Secondary | ICD-10-CM

## 2018-11-05 DIAGNOSIS — R69 Illness, unspecified: Secondary | ICD-10-CM | POA: Diagnosis not present

## 2018-11-05 NOTE — Patient Instructions (Addendum)
I believe you are on too high of a dose of amlodipine --- cut the 10 mg tablet in half and f/u here in 2 weeks or sooner if you are not improving   Dizziness Dizziness is a common problem. It is a feeling of unsteadiness or light-headedness. You may feel like you are about to faint. Dizziness can lead to injury if you stumble or fall. Anyone can become dizzy, but dizziness is more common in older adults. This condition can be caused by a number of things, including medicines, dehydration, or illness. Follow these instructions at home: Eating and drinking  Drink enough fluid to keep your urine clear or pale yellow. This helps to keep you from becoming dehydrated. Try to drink more clear fluids, such as water.  Do not drink alcohol.  Limit your caffeine intake if told to do so by your health care provider. Check ingredients and nutrition facts to see if a food or beverage contains caffeine.  Limit your salt (sodium) intake if told to do so by your health care provider. Check ingredients and nutrition facts to see if a food or beverage contains sodium. Activity  Avoid making quick movements. ? Rise slowly from chairs and steady yourself until you feel okay. ? In the morning, first sit up on the side of the bed. When you feel okay, stand slowly while you hold onto something until you know that your balance is fine.  If you need to stand in one place for a long time, move your legs often. Tighten and relax the muscles in your legs while you are standing.  Do not drive or use heavy machinery if you feel dizzy.  Avoid bending down if you feel dizzy. Place items in your home so that they are easy for you to reach without leaning over. Lifestyle  Do not use any products that contain nicotine or tobacco, such as cigarettes and e-cigarettes. If you need help quitting, ask your health care provider.  Try to reduce your stress level by using methods such as yoga or meditation. Talk with your  health care provider if you need help to manage your stress. General instructions  Watch your dizziness for any changes.  Take over-the-counter and prescription medicines only as told by your health care provider. Talk with your health care provider if you think that your dizziness is caused by a medicine that you are taking.  Tell a friend or a family member that you are feeling dizzy. If he or she notices any changes in your behavior, have this person call your health care provider.  Keep all follow-up visits as told by your health care provider. This is important. Contact a health care provider if:  Your dizziness does not go away.  Your dizziness or light-headedness gets worse.  You feel nauseous.  You have reduced hearing.  You have new symptoms.  You are unsteady on your feet or you feel like the room is spinning. Get help right away if:  You vomit or have diarrhea and are unable to eat or drink anything.  You have problems talking, walking, swallowing, or using your arms, hands, or legs.  You feel generally weak.  You are not thinking clearly or you have trouble forming sentences. It may take a friend or family member to notice this.  You have chest pain, abdominal pain, shortness of breath, or sweating.  Your vision changes.  You have any bleeding.  You have a severe headache.  You have  neck pain or a stiff neck.  You have a fever. These symptoms may represent a serious problem that is an emergency. Do not wait to see if the symptoms will go away. Get medical help right away. Call your local emergency services (911 in the [place] Do not drive yourself to the hospital. Summary  Dizziness is a feeling of unsteadiness or light-headedness. This condition can be caused by a number of things, including medicines, dehydration, or illness.  Anyone can become dizzy, but dizziness is more common in older adults.  Drink enough fluid to keep your urine clear or pale  yellow. Do not drink alcohol.  Avoid making quick movements if you feel dizzy. Monitor your dizziness for any changes. This information is not intended to replace advice given to you by your health care provider. Make sure you discuss any questions you have with your health care provider. Document Released: 02/12/2001 Document Revised: 09/21/2016 Document Reviewed: 09/21/2016 Elsevier Interactive Patient Education  2019 [employer].

## 2018-11-05 NOTE — Assessment & Plan Note (Signed)
Running low today---  Decrease norvasc to 5 mg and f/u 2 weeks or sooner prn

## 2018-11-05 NOTE — Telephone Encounter (Signed)
Received documentation that patient "reports feeling dizzy when she is up" V/S 11/02/18 Lying: 142/60  P: 70                       Sitting: 110/66  P: 72                       Standing: 102/59  P: 82 Forwarded to provider/SLS 03/05

## 2018-11-05 NOTE — Progress Notes (Addendum)
Patient ID: Brandi Mccormick, female    DOB: Sep 21, 1940  Age: 78 y.o. MRN: [id number]    Subjective:  Subjective  HPI Brandi Mccormick presents for dizziness since starting 10 mg norvasc.  Her bp has been running low.  Her blood sugars have been good.  Labs done by hematology reviewed.      Review of Systems  Constitutional: Negative for activity change, appetite change, fatigue and unexpected weight change.  Respiratory: Negative for cough and shortness of breath.   Cardiovascular: Negative for chest pain, palpitations and leg swelling.  Neurological: Positive for dizziness and light-headedness.  Psychiatric/Behavioral: Negative for behavioral problems and dysphoric mood. The patient is not nervous/anxious.     History Past Medical History:  Diagnosis Date  . Diabetes ([place])   . GERD (gastroesophageal reflux disease)   . Hyperlipidemia   . Hypertension   . Hyperthyroidism   . Pneumonia   . Shingles     She has a past surgical history that includes Dental surgery and Colonoscopy.   Her family history includes Colon cancer in her mother; Diabetes in her brother, father, mother, sister, and sister; Stroke in her mother.She reports that she has never smoked. She has never used smokeless tobacco. She reports current alcohol use. She reports that she does not use drugs.  Current Outpatient Medications on File Prior to Visit  Medication Sig Dispense Refill  . amLODipine (NORVASC) 10 MG tablet TAKE 1 TABLET BY MOUTH ONCE DAILY 90 tablet 1  . aspirin EC 81 MG tablet Take 81 mg by mouth daily.    . famotidine (PEPCID) 20 MG tablet Take 20 mg by mouth daily.    . furosemide (LASIX) 20 MG tablet Take 20 mg by mouth 2 (two) times daily.    Marland Kitchen glipiZIDE (GLUCOTROL XL) 5 MG 24 hr tablet Take 5 mg by mouth daily.    . hydroxyurea (HYDREA) 500 MG capsule Take 500 mg by mouth daily.    Marland Kitchen lisinopril (PRINIVIL,ZESTRIL) 40 MG tablet Take 1 tablet (40 mg total) by mouth daily. 90 tablet 1  .  metoprolol succinate (TOPROL-XL) 25 MG 24 hr tablet Take 25 mg by mouth daily.    Brandi Mccormick DELICA LANCETS 16W MISC OneTouch Delica Lancets 33 gauge    . ONETOUCH VERIO test strip USE 1 STRIP TO CHECK GLUCOSE ONCE DAILY 50 each 7  . SitaGLIPtin-MetFORMIN HCl [phone] MG TB24 Take by mouth daily.    . metFORMIN (GLUCOPHAGE-XR) 500 MG 24 hr tablet Take 1,000 mg by mouth daily with breakfast.    . rosuvastatin (CRESTOR) 40 MG tablet Take 1 tablet (40 mg total) by mouth daily. (Patient not taking: Reported on 11/05/2018) 90 tablet 1   No current facility-administered medications on file prior to visit.      Objective:  Objective  Physical Exam Vitals signs and nursing note reviewed.  Constitutional:      Appearance: She is well-developed.  HENT:     Head: Normocephalic and atraumatic.  Eyes:     Conjunctiva/sclera: Conjunctivae normal.  Neck:     Musculoskeletal: Normal range of motion and neck supple.     Thyroid: No thyromegaly.     Vascular: No carotid bruit or JVD.  Cardiovascular:     Rate and Rhythm: Normal rate and regular rhythm.     Heart sounds: Normal heart sounds. No murmur.  Pulmonary:     Effort: Pulmonary effort is normal. No respiratory distress.     Breath sounds: Normal breath sounds.  No wheezing or rales.  Chest:     Chest wall: No tenderness.  Neurological:     Mental Status: She is alert and oriented to person, place, and time.    BP 118/60   Pulse 72   Ht 5\' 4"  (1.626 m)   Wt 121 lb (54.9 kg)   SpO2 97%   BMI 20.77 kg/m  Wt Readings from Last 3 Encounters:  11/05/18 121 lb (54.9 kg)  10/05/18 123 lb (55.8 kg)  09/21/18 123 lb 11.2 oz (56.1 kg)     Lab Results  Component Value Date   WBC 8.4 11/02/2018   HGB 11.9 (L) 11/02/2018   HCT 35.7 (L) 11/02/2018   PLT 466 (H) 11/02/2018   GLUCOSE 230 (H) 09/11/2018   CHOL 236 (H) 09/11/2018   TRIG 156.0 (H) 09/11/2018   HDL 58.40 09/11/2018   LDLCALC 146 (H) 09/11/2018   ALT 26 09/11/2018   AST 18  09/11/2018   NA 145 09/11/2018   K 5.4 (H) 09/11/2018   CL 108 09/11/2018   CREATININE 1.17 09/11/2018   BUN 22 09/11/2018   CO2 29 09/11/2018   TSH 0.635 06/26/2018   INR 0.97 06/02/2018   HGBA1C 8.3 (H) 09/11/2018   MICROALBUR 410.0 (H) 04/02/2018   ekg-- no change since previous  No results found.   Assessment & Plan:  Plan  I am having [name] maintain her OneTouch Verio, OneTouch Delica Lancets 02B, furosemide, aspirin EC, lisinopril, famotidine, amLODipine, rosuvastatin, glipiZIDE, hydroxyurea, metoprolol succinate, metFORMIN, and SitaGLIPtin-MetFORMIN HCl.  No orders of the defined types were placed in this encounter.   Problem List Items Addressed This Visit      Unprioritized   Dizziness - Primary    Maybe caused by low bp--- dec norvasc to 5 mg and recheck in 2 weeks or sooner prn       Relevant Orders   EKG 12-Lead (Completed)   DM (diabetes mellitus) type II uncontrolled, periph vascular disorder (HCC)   Relevant Medications   SitaGLIPtin-MetFORMIN HCl [phone] MG TB24   Essential hypertension    Running low today---  Decrease norvasc to 5 mg and f/u 2 weeks or sooner prn          Follow-up: Return in about 2 weeks (around 11/19/2018), or if symptoms worsen or fail to improve, for bp check .  [name], DO

## 2018-11-07 NOTE — Assessment & Plan Note (Signed)
Maybe caused by low bp--- dec norvasc to 5 mg and recheck in 2 weeks or sooner prn

## 2018-11-09 ENCOUNTER — Encounter: Payer: Self-pay | Admitting: Podiatry

## 2018-11-09 NOTE — Progress Notes (Signed)
Subjective: Brandi Mccormick presents today with painful, thick toenails 1-5 b/l that she cannot cut and which interfere with daily activities.  Pain is aggravated when wearing enclosed shoe gear.  [name], DO is her PCP and last visit was 09/11/2018.   Current Outpatient Medications:  .  amLODipine (NORVASC) 10 MG tablet, TAKE 1 TABLET BY MOUTH ONCE DAILY, Disp: 90 tablet, Rfl: 1 .  aspirin EC 81 MG tablet, Take 81 mg by mouth daily., Disp: , Rfl:  .  famotidine (PEPCID) 20 MG tablet, Take 20 mg by mouth daily., Disp: , Rfl:  .  furosemide (LASIX) 20 MG tablet, Take 20 mg by mouth 2 (two) times daily., Disp: , Rfl:  .  glipiZIDE (GLUCOTROL XL) 5 MG 24 hr tablet, Take 5 mg by mouth daily., Disp: , Rfl:  .  hydroxyurea (HYDREA) 500 MG capsule, Take 500 mg by mouth daily., Disp: , Rfl:  .  lisinopril (PRINIVIL,ZESTRIL) 40 MG tablet, Take 1 tablet (40 mg total) by mouth daily., Disp: 90 tablet, Rfl: 1 .  metFORMIN (GLUCOPHAGE-XR) 500 MG 24 hr tablet, Take 1,000 mg by mouth daily with breakfast., Disp: , Rfl:  .  metoprolol succinate (TOPROL-XL) 25 MG 24 hr tablet, Take 25 mg by mouth daily., Disp: , Rfl:  .  ONETOUCH DELICA LANCETS 09K MISC, OneTouch Delica Lancets 33 gauge, Disp: , Rfl:  .  ONETOUCH VERIO test strip, USE 1 STRIP TO CHECK GLUCOSE ONCE DAILY, Disp: 50 each, Rfl: 7 .  rosuvastatin (CRESTOR) 40 MG tablet, Take 1 tablet (40 mg total) by mouth daily. (Patient not taking: Reported on 11/05/2018), Disp: 90 tablet, Rfl: 1 .  SitaGLIPtin-MetFORMIN HCl [phone] MG TB24, Take by mouth daily., Disp: , Rfl:   Allergies  Allergen Reactions  . Penicillins Nausea And Vomiting    Has patient had a PCN reaction causing immediate rash, facial/tongue/throat swelling, SOB or lightheadedness with hypotension: no Has patient had a PCN reaction causing severe rash involving mucus membranes or skin necrosis: no Has patient had a PCN reaction that required hospitalization: no Has patient  had a PCN reaction occurring within the last 10 years: no If all of the above answers are "NO", then may proceed with Cephalosporin use.     Objective:  Vascular Examination: Capillary refill time immediate x 10 digits  Dorsalis pedis 2/4 b/l  Posterior tibial pulses 1/4 b/l  Digital hair present x 10 digits.  Skin temperature gradient WNL b/l.  Trace pedal edema BLE.  Dermatological Examination: Skin with normal turgor, texture and tone b/l.  Toenails 1-5 b/l discolored, thick, dystrophic with subungual debris and pain with palpation to nailbeds due to thickness of nails.  No open wounds.   No interdigital macerations noted.   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  No pain, crepitus or joint limitation noted with ROM.   Neurological: Sensation intact with 10 gram monofilament.  Vibratory sensation intact.  Assessment: Painful onychomycosis toenails 1-5 b/l  NIDDM  Plan: 1. Toenails 1-5 b/l were debrided in length and girth without iatrogenic bleeding. 2. Patient to continue soft, supportive shoe gear daily. 3. Patient to report any pedal injuries to medical professional immediately. 4. Follow up 3 months.  5. Patient/POA to call should there be a concern in the interim.

## 2018-11-18 ENCOUNTER — Other Ambulatory Visit: Payer: Self-pay | Admitting: Family Medicine

## 2018-11-20 ENCOUNTER — Ambulatory Visit: Payer: Medicare HMO | Admitting: Family Medicine

## 2018-11-24 ENCOUNTER — Ambulatory Visit (INDEPENDENT_AMBULATORY_CARE_PROVIDER_SITE_OTHER): Payer: Medicare HMO | Admitting: Family Medicine

## 2018-11-24 ENCOUNTER — Other Ambulatory Visit: Payer: Self-pay

## 2018-11-24 ENCOUNTER — Encounter: Payer: Self-pay | Admitting: Family Medicine

## 2018-11-24 DIAGNOSIS — E785 Hyperlipidemia, unspecified: Secondary | ICD-10-CM

## 2018-11-24 DIAGNOSIS — I10 Essential (primary) hypertension: Secondary | ICD-10-CM | POA: Diagnosis not present

## 2018-11-24 NOTE — Patient Instructions (Signed)
DASH Eating Plan  DASH stands for "Dietary Approaches to Stop Hypertension." The DASH eating plan is a healthy eating plan that has been shown to reduce high blood pressure (hypertension). It may also reduce your risk for type 2 diabetes, heart disease, and stroke. The DASH eating plan may also help with weight loss.  What are tips for following this plan?    General guidelines   Avoid eating more than 2,300 mg (milligrams) of salt (sodium) a day. If you have hypertension, you may need to reduce your sodium intake to 1,500 mg a day.   Limit alcohol intake to no more than 1 drink a day for nonpregnant women and 2 drinks a day for men. One drink equals 12 oz of beer, 5 oz of wine, or 1 oz of hard liquor.   Work with your health care provider to maintain a healthy body weight or to lose weight. Ask what an ideal weight is for you.   Get at least 30 minutes of exercise that causes your heart to beat faster (aerobic exercise) most days of the week. Activities may include walking, swimming, or biking.   Work with your health care provider or diet and nutrition specialist (dietitian) to adjust your eating plan to your individual calorie needs.  Reading food labels     Check food labels for the amount of sodium per serving. Choose foods with less than 5 percent of the Daily Value of sodium. Generally, foods with less than 300 mg of sodium per serving fit into this eating plan.   To find whole grains, look for the word "whole" as the first word in the ingredient list.  Shopping   Buy products labeled as "low-sodium" or "no salt added."   Buy fresh foods. Avoid canned foods and premade or frozen meals.  Cooking   Avoid adding salt when cooking. Use salt-free seasonings or herbs instead of table salt or sea salt. Check with your health care provider or pharmacist before using salt substitutes.   Do not fry foods. Cook foods using healthy methods such as baking, boiling, grilling, and broiling instead.   Cook with  heart-healthy oils, such as olive, canola, soybean, or sunflower oil.  Meal planning   Eat a balanced diet that includes:  ? 5 or more servings of fruits and vegetables each day. At each meal, try to fill half of your plate with fruits and vegetables.  ? Up to 6-8 servings of whole grains each day.  ? Less than 6 oz of lean meat, poultry, or fish each day. A 3-oz serving of meat is about the same size as a deck of cards. One egg equals 1 oz.  ? 2 servings of low-fat dairy each day.  ? A serving of nuts, seeds, or beans 5 times each week.  ? Heart-healthy fats. Healthy fats called Omega-3 fatty acids are found in foods such as flaxseeds and coldwater fish, like sardines, salmon, and mackerel.   Limit how much you eat of the following:  ? Canned or prepackaged foods.  ? Food that is high in trans fat, such as fried foods.  ? Food that is high in saturated fat, such as fatty meat.  ? Sweets, desserts, sugary drinks, and other foods with added sugar.  ? Full-fat dairy products.   Do not salt foods before eating.   Try to eat at least 2 vegetarian meals each week.   Eat more home-cooked food and less restaurant, buffet, and fast food.     When eating at a restaurant, ask that your food be prepared with less salt or no salt, if possible.  What foods are recommended?  The items listed may not be a complete list. Talk with your dietitian about what dietary choices are best for you.  Grains  Whole-grain or whole-wheat bread. Whole-grain or whole-wheat pasta. Brown rice. Oatmeal. Quinoa. Bulgur. Whole-grain and low-sodium cereals. Pita bread. Low-fat, low-sodium crackers. Whole-wheat flour tortillas.  Vegetables  Fresh or frozen vegetables (raw, steamed, roasted, or grilled). Low-sodium or reduced-sodium tomato and vegetable juice. Low-sodium or reduced-sodium tomato sauce and tomato paste. Low-sodium or reduced-sodium canned vegetables.  Fruits  All fresh, dried, or frozen fruit. Canned fruit in natural juice (without  added sugar).  Meat and other protein foods  Skinless chicken or turkey. Ground chicken or turkey. Pork with fat trimmed off. Fish and seafood. Egg whites. Dried beans, peas, or lentils. Unsalted nuts, nut butters, and seeds. Unsalted canned beans. Lean cuts of beef with fat trimmed off. Low-sodium, lean deli meat.  Dairy  Low-fat (1%) or fat-free (skim) milk. Fat-free, low-fat, or reduced-fat cheeses. Nonfat, low-sodium ricotta or cottage cheese. Low-fat or nonfat yogurt. Low-fat, low-sodium cheese.  Fats and oils  Soft margarine without trans fats. Vegetable oil. Low-fat, reduced-fat, or light mayonnaise and salad dressings (reduced-sodium). Canola, safflower, olive, soybean, and sunflower oils. Avocado.  Seasoning and other foods  Herbs. Spices. Seasoning mixes without salt. Unsalted popcorn and pretzels. Fat-free sweets.  What foods are not recommended?  The items listed may not be a complete list. Talk with your dietitian about what dietary choices are best for you.  Grains  Baked goods made with fat, such as croissants, muffins, or some breads. Dry pasta or rice meal packs.  Vegetables  Creamed or fried vegetables. Vegetables in a cheese sauce. Regular canned vegetables (not low-sodium or reduced-sodium). Regular canned tomato sauce and paste (not low-sodium or reduced-sodium). Regular tomato and vegetable juice (not low-sodium or reduced-sodium). Pickles. Olives.  Fruits  Canned fruit in a light or heavy syrup. Fried fruit. Fruit in cream or butter sauce.  Meat and other protein foods  Fatty cuts of meat. Ribs. Fried meat. Bacon. Sausage. Bologna and other processed lunch meats. Salami. Fatback. Hotdogs. Bratwurst. Salted nuts and seeds. Canned beans with added salt. Canned or smoked fish. Whole eggs or egg yolks. Chicken or turkey with skin.  Dairy  Whole or 2% milk, cream, and half-and-half. Whole or full-fat cream cheese. Whole-fat or sweetened yogurt. Full-fat cheese. Nondairy creamers. Whipped toppings.  Processed cheese and cheese spreads.  Fats and oils  Butter. Stick margarine. Lard. Shortening. Ghee. Bacon fat. Tropical oils, such as coconut, palm kernel, or palm oil.  Seasoning and other foods  Salted popcorn and pretzels. Onion salt, garlic salt, seasoned salt, table salt, and sea salt. Worcestershire sauce. Tartar sauce. Barbecue sauce. Teriyaki sauce. Soy sauce, including reduced-sodium. Steak sauce. Canned and packaged gravies. Fish sauce. Oyster sauce. Cocktail sauce. Horseradish that you find on the shelf. Ketchup. Mustard. Meat flavorings and tenderizers. Bouillon cubes. Hot sauce and Tabasco sauce. Premade or packaged marinades. Premade or packaged taco seasonings. Relishes. Regular salad dressings.  Where to find more information:   National Heart, Lung, and Blood Institute: www.nhlbi.nih.gov   American Heart Association: www.heart.org  Summary   The DASH eating plan is a healthy eating plan that has been shown to reduce high blood pressure (hypertension). It may also reduce your risk for type 2 diabetes, heart disease, and stroke.   With the   DASH eating plan, you should limit salt (sodium) intake to 2,300 mg a day. If you have hypertension, you may need to reduce your sodium intake to 1,500 mg a day.   When on the DASH eating plan, aim to eat more fresh fruits and vegetables, whole grains, lean proteins, low-fat dairy, and heart-healthy fats.   Work with your health care provider or diet and nutrition specialist (dietitian) to adjust your eating plan to your individual calorie needs.  This information is not intended to replace advice given to you by your health care provider. Make sure you discuss any questions you have with your health care provider.  Document Released: 08/08/2011 Document Revised: 08/12/2016 Document Reviewed: 08/12/2016  Elsevier Interactive Patient Education  2019 [employer]

## 2018-11-24 NOTE — Progress Notes (Signed)
Virtual Visit via Telephone Note  I connected with [name] on 11/24/18 at 10:30 AM EDT by telephone and verified that I am speaking with the correct person using two identifiers.   I discussed the limitations, risks, security and privacy concerns of performing an evaluation and management service by telephone and the availability of in person appointments. I also discussed with the patient that there may be a patient responsible charge related to this service. The patient expressed understanding and agreed to proceed.   History of Present Illness: Pt needed bp f/u  Pt with no c/o headache, cp or sob.  She needs no refills and will schedule labs once outdoor tent is set up  Observations/Objective: bp 131/73 No fever  Normal resp    Assessment and Plan: HTN--  Well controlled, no changes to meds. Encouraged heart healthy diet such as the DASH diet and exercise as tolerated.   Hyperlipidemia--- Encouraged heart healthy diet, increase exercise, avoid trans fats, consider a krill oil cap daily  Follow Up Instructions:    I discussed the assessment and treatment plan with the patient. The patient was provided an opportunity to ask questions and all were answered. The patient agreed with the plan and demonstrated an understanding of the instructions.   The patient was advised to call back or seek an in-person evaluation if the symptoms worsen or if the condition fails to improve as anticipated.  I provided 10 minutes of non-face-to-face time during this encounter.   [name], DO

## 2018-11-26 ENCOUNTER — Telehealth: Payer: Self-pay

## 2018-11-26 NOTE — Telephone Encounter (Signed)
Pt. Called stated someone called her about 12/15/18 appointment she stated she would like a virtual appointment on this day, Also Lattie Haw stated she would like Pt. To come in earlier for lab appointment so labs can be discussed during virtual call. High priority message sent to scheduling.

## 2018-11-27 ENCOUNTER — Telehealth: Payer: Self-pay | Admitting: Nurse Practitioner

## 2018-11-27 NOTE — Telephone Encounter (Signed)
Called patient per 3/26 sch message - pt unavailable - pt husband said he will let her know to call back   sch message:  <No visit type provided>  Department: [facility]  Provider: Owens Shark, NP  Appointment Notes:  Please return call to Pt. she stated someone called her for 12/15/18 appointment. she stated she would like a virtual appointment. Lattie Haw also wants to know if she can come in earlier for her lab appointment that way they can discuss lab results on the   virtual call

## 2018-12-15 ENCOUNTER — Telehealth: Payer: Self-pay | Admitting: Nurse Practitioner

## 2018-12-15 ENCOUNTER — Other Ambulatory Visit: Payer: Self-pay

## 2018-12-15 ENCOUNTER — Other Ambulatory Visit: Payer: Self-pay | Admitting: Family Medicine

## 2018-12-15 ENCOUNTER — Inpatient Hospital Stay: Payer: Medicare HMO

## 2018-12-15 ENCOUNTER — Inpatient Hospital Stay: Payer: Medicare HMO | Attending: Hematology and Oncology | Admitting: Nurse Practitioner

## 2018-12-15 ENCOUNTER — Other Ambulatory Visit: Payer: Self-pay | Admitting: Hematology and Oncology

## 2018-12-15 ENCOUNTER — Encounter: Payer: Self-pay | Admitting: Nurse Practitioner

## 2018-12-15 DIAGNOSIS — D45 Polycythemia vera: Secondary | ICD-10-CM | POA: Insufficient documentation

## 2018-12-15 DIAGNOSIS — D471 Chronic myeloproliferative disease: Secondary | ICD-10-CM

## 2018-12-15 DIAGNOSIS — E119 Type 2 diabetes mellitus without complications: Secondary | ICD-10-CM | POA: Diagnosis not present

## 2018-12-15 DIAGNOSIS — I10 Essential (primary) hypertension: Secondary | ICD-10-CM | POA: Insufficient documentation

## 2018-12-15 LAB — CBC WITH DIFFERENTIAL (CANCER CENTER ONLY)
Abs Immature Granulocytes: 0.02 10*3/uL (ref 0.00–0.07)
Basophils Absolute: 0.1 10*3/uL (ref 0.0–0.1)
Basophils Relative: 1 %
Eosinophils Absolute: 0.5 10*3/uL (ref 0.0–0.5)
Eosinophils Relative: 7 %
HCT: 33.5 % — ABNORMAL LOW (ref 36.0–46.0)
Hemoglobin: 11.2 g/dL — ABNORMAL LOW (ref 12.0–15.0)
Immature Granulocytes: 0 %
Lymphocytes Relative: 25 %
Lymphs Abs: 2 10*3/uL (ref 0.7–4.0)
MCH: 36 pg — ABNORMAL HIGH (ref 26.0–34.0)
MCHC: 33.4 g/dL (ref 30.0–36.0)
MCV: 107.7 fL — ABNORMAL HIGH (ref 80.0–100.0)
Monocytes Absolute: 0.7 10*3/uL (ref 0.1–1.0)
Monocytes Relative: 9 %
Neutro Abs: 4.4 10*3/uL (ref 1.7–7.7)
Neutrophils Relative %: 58 %
Platelet Count: 424 10*3/uL — ABNORMAL HIGH (ref 150–400)
RBC: 3.11 MIL/uL — ABNORMAL LOW (ref 3.87–5.11)
RDW: 15.6 % — ABNORMAL HIGH (ref 11.5–15.5)
WBC Count: 7.7 10*3/uL (ref 4.0–10.5)
nRBC: 0 % (ref 0.0–0.2)

## 2018-12-15 NOTE — Progress Notes (Signed)
  [place] OFFICE VISIT PROGRESS NOTE  I connected with [name] on 12/15/18 at 1 pm by telephone and verified that I am speaking with the correct person using two identifiers.   Patient's location:  Home  Provider's location:  Office  Diagnosis: Polycythemia vera  INTERVAL HISTORY:   Ms. Madani continues hydroxyurea 500 mg daily.  She overall feels well.  She reports a good appetite.  No nausea or vomiting.  No mouth sores.  No diarrhea.  No rash.  No bleeding.  No symptoms of thrombosis.  "Dizzy" episodes are occurring less frequently.  Blood pressure this morning was 142/81.  Blood sugar this morning was 80.   Lab Results:  Lab Results  Component Value Date   WBC 7.7 12/15/2018   HGB 11.2 (L) 12/15/2018   HCT 33.5 (L) 12/15/2018   MCV 107.7 (H) 12/15/2018   PLT 424 (H) 12/15/2018   NEUTROABS 4.4 12/15/2018    Imaging:  No results found.  Medications: I have reviewed the patient's current medications.  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 Type 2 diabetes 3. Hypertension 4. Focal/segmental glomerular sclerosis  Disposition: Ms. Iacobucci remains stable from a hematologic standpoint.  We reviewed the CBC from today.  Platelet count and hemoglobin remain in goal range.  She will continue hydroxyurea 500 mg daily.  She will return for lab and follow-up in 6 weeks.  She will contact the office in the interim with any problems.  I discussed the assessment and treatment plan with the patient. The patient was provided an opportunity to ask questions and all were answered. The patient agreed with the plan and  demonstrated an understanding of the instructions.  I provided 10 minutes of telephone conversation during this encounter, and > 50% was spent counseling as documented under my assessment & plan.   Ned Card ANP/GNP-BC   12/15/2018 12:53 PM

## 2018-12-15 NOTE — Telephone Encounter (Signed)
Scheduled appt per 4/14 los. °

## 2018-12-16 ENCOUNTER — Other Ambulatory Visit: Payer: Self-pay | Admitting: Internal Medicine

## 2018-12-17 DIAGNOSIS — R69 Illness, unspecified: Secondary | ICD-10-CM | POA: Diagnosis not present

## 2018-12-21 ENCOUNTER — Other Ambulatory Visit: Payer: Self-pay

## 2018-12-21 ENCOUNTER — Other Ambulatory Visit (INDEPENDENT_AMBULATORY_CARE_PROVIDER_SITE_OTHER): Payer: Medicare HMO

## 2018-12-21 DIAGNOSIS — E1169 Type 2 diabetes mellitus with other specified complication: Secondary | ICD-10-CM

## 2018-12-21 DIAGNOSIS — E785 Hyperlipidemia, unspecified: Secondary | ICD-10-CM | POA: Diagnosis not present

## 2018-12-21 DIAGNOSIS — E1165 Type 2 diabetes mellitus with hyperglycemia: Secondary | ICD-10-CM | POA: Diagnosis not present

## 2018-12-21 LAB — COMPREHENSIVE METABOLIC PANEL
ALT: 15 U/L (ref 0–35)
AST: 16 U/L (ref 0–37)
Albumin: 3.9 g/dL (ref 3.5–5.2)
Alkaline Phosphatase: 75 U/L (ref 39–117)
BUN: 19 mg/dL (ref 6–23)
CO2: 23 mEq/L (ref 19–32)
Calcium: 9.3 mg/dL (ref 8.4–10.5)
Chloride: 106 mEq/L (ref 96–112)
Creatinine, Ser: 1.71 mg/dL — ABNORMAL HIGH (ref 0.40–1.20)
GFR: 28.93 mL/min — ABNORMAL LOW (ref >60.00)
Glucose, Bld: 92 mg/dL (ref 70–99)
Potassium: 4.7 mEq/L (ref 3.5–5.1)
Sodium: 138 mEq/L (ref 135–145)
Total Bilirubin: 0.4 mg/dL (ref 0.2–1.2)
Total Protein: 6.7 g/dL (ref 6.0–8.3)

## 2018-12-21 LAB — LIPID PANEL
Cholesterol: 160 mg/dL (ref 0–200)
HDL: 51.3 mg/dL (ref >39.00)
LDL Cholesterol: 92 mg/dL (ref 0–99)
NonHDL: 108.3
Total CHOL/HDL Ratio: 3
Triglycerides: 84 mg/dL (ref 0.0–149.0)
VLDL: 16.8 mg/dL (ref 0.0–40.0)

## 2018-12-21 LAB — HEMOGLOBIN A1C: Hgb A1c MFr Bld: 6.1 % (ref 4.6–6.5)

## 2018-12-22 NOTE — Telephone Encounter (Signed)
Please advise dose and refill.

## 2018-12-26 ENCOUNTER — Other Ambulatory Visit: Payer: Self-pay | Admitting: Hematology and Oncology

## 2018-12-28 ENCOUNTER — Encounter: Payer: Self-pay | Admitting: *Deleted

## 2018-12-29 ENCOUNTER — Other Ambulatory Visit: Payer: Self-pay | Admitting: Hematology and Oncology

## 2019-01-06 ENCOUNTER — Other Ambulatory Visit: Payer: Self-pay

## 2019-01-06 ENCOUNTER — Ambulatory Visit: Payer: Medicare HMO | Admitting: Internal Medicine

## 2019-01-06 ENCOUNTER — Encounter: Payer: Self-pay | Admitting: Internal Medicine

## 2019-01-06 VITALS — BP 130/70 | HR 75 | Temp 98.2°F | Ht 64.0 in | Wt 122.0 lb

## 2019-01-06 DIAGNOSIS — E1165 Type 2 diabetes mellitus with hyperglycemia: Secondary | ICD-10-CM | POA: Diagnosis not present

## 2019-01-06 DIAGNOSIS — E1169 Type 2 diabetes mellitus with other specified complication: Secondary | ICD-10-CM

## 2019-01-06 DIAGNOSIS — E1151 Type 2 diabetes mellitus with diabetic peripheral angiopathy without gangrene: Secondary | ICD-10-CM

## 2019-01-06 DIAGNOSIS — Z8639 Personal history of other endocrine, nutritional and metabolic disease: Secondary | ICD-10-CM

## 2019-01-06 DIAGNOSIS — IMO0002 Reserved for concepts with insufficient information to code with codable children: Secondary | ICD-10-CM

## 2019-01-06 DIAGNOSIS — E785 Hyperlipidemia, unspecified: Secondary | ICD-10-CM

## 2019-01-06 MED ORDER — METFORMIN HCL ER 500 MG PO TB24: 500.0000 mg | ORAL_TABLET | Freq: Every day | ORAL | 3 refills | Status: DC

## 2019-01-06 MED ORDER — GLIPIZIDE ER 5 MG PO TB24: 5.0000 mg | ORAL_TABLET | Freq: Every day | ORAL | 3 refills | Status: DC

## 2019-01-06 NOTE — Progress Notes (Signed)
Patient ID: Brandi Mccormick, female   DOB: [birth year], 78 y.o.   MRN: [id number]  HPI: Brandi Mccormick is a 78 y.o.-year-old female, presenting for f/u for DM2, dx 2013, non-insulin-dependent, uncontrolled, with complications (microalbuminuria) and h/o hyperthyroidism. Last visit 4 months ago.  DM2: Hemoglobin A1c levels: Lab Results  Component Value Date   HGBA1C 6.1 12/21/2018   HGBA1C 8.3 (H) 09/11/2018   HGBA1C 6.9 (A) 06/04/2018   HGBA1C 7.6 (A) 02/02/2018   HGBA1C 7.4 10/03/2017   HGBA1C 7.0 (H) 04/18/2017   HGBA1C 6.9 02/14/2017   HGBA1C 7.3 11/15/2016   HGBA1C 6.6 06/28/2016   HGBA1C 7.1 03/28/2016   HGBA1C 7.5 12/29/2015   HGBA1C 6.9 08/30/2015   HGBA1C 6.7 05/02/2015   HGBA1C 6.6 (H) 12/27/2014   HGBA1C 6.7 (H) 09/26/2014   Pt is on a regimen of: - Metformin ER 750 mg with dinner >>  JanuMet XR [phone] mg once a day - Glipizide XL 5 mg in a.m. and 2.5 mg before dinner (evening dose 3x in last mo) We tried Tradjenta 5 mg daily in am - too expensive. She was getting diarrhea from Metformin >> now Metformin XR 750 mg daily >> tolerates this well.  Pt checks her sugars once a day: - am:  124-157 >> 130s, 140 >> 62-115, 120, 132 - 2h after b'fast:122 >> 114 >> n/c >> 122-138 - before lunch:  126, 135 >> 130-140 >> 80-136, 142 - 2h after lunch:   93-145 >> n/c >> n/c >> 88, 101, 155 - before dinner:  70-146 >> 130-140 >> 108-131, 157 - 2h after dinner:145-155 >> 91-157 >> n/c >> 64-151 - bedtime: 109-141 >> 98-120 >> 150 >> 123, 154 - nighttime: n/c >> 161 >> n/c Lowest sugar: 70 >> 80 >> 62; it is uncl it is unclear at which level she has hypoglycemia awareness. Highest sugar: 157 >> 170 >> 157.  Meter: OneTouch Verio.  Pt's meals are: - Breakfast: egg, coffee, juice; sometime - Lunch: No Lunch - Dinner: meat + vegetables >> late dinner during the weekend as her son is cooking and brings her food - Snacks: 1-2 She continues to drink regular sodas but seldom  now.  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:  Lab Results  Component Value Date   BUN 19 12/21/2018   CREATININE 1.71 (H) 12/21/2018   12/21/2018: GFR 28.9  Lab Results  Component Value Date   GFRAA 43 (L) 07/27/2018   GFRAA >60 06/26/2018   GFRAA 38 (L) 06/02/2018   GFRAA 82 06/28/2016   GFRAA >90 06/25/2014   GFRAA >90 06/24/2014   GFRAA >90 10/23/2013   She has worsening microalbuminuria: Lab Results  Component Value Date   MICRALBCREAT 422.7 (H) 04/02/2018   MICRALBCREAT 225.8 (H) 11/15/2016   MICRALBCREAT 48.8 (H) 06/28/2016   MICRALBCREAT 9.9 02/21/2014   MICRALBCREAT 8.8 11/16/2013  Previously on irbesartan, stopped due to recall.  Now on lisinopril 40.  -+ HL; last set of lipids: Lab Results  Component Value Date   CHOL 160 12/21/2018   HDL 51.30 12/21/2018   LDLCALC 92 12/21/2018   TRIG 84.0 12/21/2018   CHOLHDL 3 12/21/2018  Prev. on Simvastatin >> off in 06/2016 b/c fatigue.  She started pravastatin 80 mg daily in 12/2016 >> switched to Crestor before last visit.  She is off Zetia.   - no numbness and tingling in her feet.  - Last eye exam 12/2017: No DR  She has a h/o hyperthyroidism, previously treated with  medication, approximately 20 years ago.  No other history available.  Pt denies: - feeling nodules in neck - hoarseness - dysphagia - choking - SOB with lying down  TFTs were normal recently: Lab Results  Component Value Date   TSH 0.635 06/26/2018   TSH 1.11 04/02/2018   TSH 1.13 07/03/2017   TSH 1.12 06/28/2016   TSH 1.29 06/23/2015   TSH 1.44 10/18/2014   TSH 0.93 08/15/2014   TSH 0.44 02/17/2014   TSH 0.28 (L) 12/20/2013   FREET4 0.93 07/03/2017   FREET4 0.96 06/28/2016   FREET4 0.91 08/15/2014   FREET4 1.00 02/17/2014   FREET4 0.97 12/20/2013   T3FREE 3.2 07/03/2017   T3FREE 3.1 06/28/2016   T3FREE 3.2 08/15/2014   T3FREE 2.6 02/17/2014   T3FREE 2.9 12/20/2013   ROS: Constitutional: no weight gain/no weight loss, no fatigue,  no subjective hyperthermia, no subjective hypothermia Eyes: no blurry vision, no xerophthalmia ENT: no sore throat, + see HPI Cardiovascular: no CP/no SOB/no palpitations/no leg swelling Respiratory: no cough/no SOB/no wheezing Gastrointestinal: no N/no V/no D/no C/no acid reflux Musculoskeletal: no muscle aches/no joint aches Skin: no rashes, no hair loss Neurological: no tremors/no numbness/no tingling/no dizziness  I reviewed pt's medications, allergies, PMH, social hx, family hx, and changes were documented in the history of present illness. Otherwise, unchanged from my initial visit note.  Past Medical History:  Diagnosis Date  . Diabetes ([place])   . GERD (gastroesophageal reflux disease)   . Hyperlipidemia   . Hypertension   . Hyperthyroidism   . Pneumonia   . Shingles    Past Surgical History:  Procedure Laterality Date  . COLONOSCOPY    . DENTAL SURGERY     Social History   Socioeconomic History  . Marital status: Married    Spouse name: Not on file  . Number of children: Not on file  . Years of education: Not on file  . Highest education level: Not on file  Occupational History  . 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 Financial resource strain: Not on file  . Food insecurity:    Worry: Not on file    Inability: Not on file  . Transportation needs:    Medical: Not on file    Non-medical: Not on file  Tobacco Use  . Smoking status: Never Smoker  . Smokeless tobacco: Never Used  Substance and Sexual Activity  . Alcohol use: Yes    Alcohol/week: 0.0 standard drinks    Comment: occasional wine cooler  . Drug use: No  . Sexual activity: Never    Partners: Male  Lifestyle  . Physical activity:    Days per week: Not on file    Minutes per session: Not on file  . Stress: Not on file  Relationships  . Social connections:    Talks on phone: Not on file    Gets together: Not on file    Attends religious service: Not on file    Active  member of club or organization: Not on file    Attends meetings of clubs or organizations: Not on file    Relationship status: Not on file  . Intimate partner violence:    Fear of current or ex partner: Not on file    Emotionally abused: Not on file    Physically abused: Not on file    Forced sexual activity: Not on file  Other Topics Concern  . Not on file  Social History Narrative  Exercise-- yard work,  Walking in [employer]   Current Outpatient Medications on File Prior to Visit  Medication Sig Dispense Refill  . amLODipine (NORVASC) 10 MG tablet TAKE 1 TABLET BY MOUTH ONCE DAILY 90 tablet 1  . aspirin EC 81 MG tablet Take 81 mg by mouth daily.    . famotidine (PEPCID) 20 MG tablet Take 20 mg by mouth daily.    . furosemide (LASIX) 20 MG tablet Take 20 mg by mouth 2 (two) times daily.    Marland Kitchen glipiZIDE (GLUCOTROL XL) 5 MG 24 hr tablet Take 5 mg by mouth daily.    . hydroxyurea (HYDREA) 500 MG capsule Take 1 capsule (500 mg total) by mouth daily. 60 capsule 0  . JANUMET XR [phone] MG TB24 TAKE 1 TABLET BY MOUTH ONCE DAILY. STOP TAKING METFORMIN 30 tablet 5  . lisinopril (PRINIVIL,ZESTRIL) 40 MG tablet Take 1 tablet (40 mg total) by mouth daily. 90 tablet 1  . metFORMIN (GLUCOPHAGE-XR) 500 MG 24 hr tablet Take 1,000 mg by mouth daily with breakfast.    . metoprolol succinate (TOPROL-XL) 25 MG 24 hr tablet Take 1 tablet by mouth once daily 90 tablet 0  . ONETOUCH DELICA LANCETS 28N MISC OneTouch Delica Lancets 33 gauge    . ONETOUCH VERIO test strip USE 1 STRIP TO CHECK GLUCOSE ONCE DAILY 50 each 0  . rosuvastatin (CRESTOR) 40 MG tablet Take 1 tablet (40 mg total) by mouth daily. (Patient not taking: Reported on 11/05/2018) 90 tablet 1   No current facility-administered medications on file prior to visit.    Allergies  Allergen Reactions  . Penicillins Nausea And Vomiting    Has patient had a PCN reaction causing immediate rash, facial/tongue/throat swelling, SOB or lightheadedness with  hypotension: no Has patient had a PCN reaction causing severe rash involving mucus membranes or skin necrosis: no Has patient had a PCN reaction that required hospitalization: no Has patient had a PCN reaction occurring within the last 10 years: no If all of the above answers are "NO", then may proceed with Cephalosporin use.    Family History  Problem Relation Age of Onset  . Diabetes Sister   . Diabetes Brother   . Diabetes Sister   . Diabetes Mother        and on mother's side of family  . Colon cancer Mother   . Stroke Mother   . Diabetes Father        and on father's side of family  . Heart disease Neg Hx   . Rectal cancer Neg Hx   . Stomach cancer Neg Hx     PE: BP 130/70   Pulse 75   Temp 98.2 F (36.8 C)   Ht 5\' 4"  (1.626 m)   Wt 122 lb (55.3 kg)   SpO2 98%   BMI 20.94 kg/m  Body mass index is 20.94 kg/m. Wt Readings from Last 3 Encounters:  01/06/19 122 lb (55.3 kg)  11/05/18 121 lb (54.9 kg)  10/05/18 123 lb (55.8 kg)   Constitutional: normal weight, in NAD Eyes: PERRLA, EOMI, no exophthalmos ENT: moist mucous membranes, no thyromegaly, no cervical lymphadenopathy Cardiovascular: RRR, No MRG Respiratory: CTA B Gastrointestinal: abdomen soft, NT, ND, BS+ Musculoskeletal: no deformities, strength intact in all 4 Skin: moist, warm, no rashes Neurological: no tremor with outstretched hands, DTR normal in all 4  ASSESSMENT: 1. DM2, non-insulin-dependent, uncontrolled, with complications - MAU  2. H/o Hyperthyroidism - 20 years ago - was on meds, cannot  remember name  3. HL  PLAN:  1. Patient with history of uncontrolled diabetes, with improved control in the last 4 years, only on an oral diabetic regimen.  At last visit sugars her HbA1c was higher, 8.3%, and we continued her Janumet ER (started by PCP) and glipizide.  She had no lows at that time.  Since then, she had another HbA1c more recently, last month, which was excellent, at 6.1%. -Of note, we  are limited in the medication choices due to her decreased kidney function.  She has FSGS and seen Dr. Johnney Ou.  She is not on prednisone but rituximab was contemplated.  She is on hydroxyurea for chronic myeloproliferative disease.  - at this visit, sugars are better, almost all at goal, with occasional mild lows, possibly 2/2 reducing sodas.  -I reviewed her kidney function and her latest GFR is 28.9.  In this case, we will need to stop Janumet ER.  We could continue with a low-dose metformin ER, no more than 500 mg daily - I suggested to:  Patient Instructions  Please continue: - Glipizide XL 5 mg in a.m. and 2.5 mg before a large dinner only  Stop: - Janumet ER [phone] mg with dinner  Start: - Metformin ER 500 mg with dinner  Please return in 4 months with your sugar log.   - today, HbA1c is 5.7% (better - but this was checked by mistake - pt had a recent level checked)  - continue checking sugars at different times of the day - check 1x a day, rotating checks - advised for yearly eye exams >> she is not UTD - Return to clinic in 4 mo with sugar log   2. H/o hyperthyroidism -Appears euthyroid -Reviewed the most recent TFTs and they were normal in 06/2018  3. HL - Reviewed latest lipid panel from last month: At goal Lab Results  Component Value Date   CHOL 160 12/21/2018   HDL 51.30 12/21/2018   LDLCALC 92 12/21/2018   TRIG 84.0 12/21/2018   CHOLHDL 3 12/21/2018  - Continues Crestor 40 without side effects.  Philemon Kingdom, MD PhD [facility]

## 2019-01-06 NOTE — Patient Instructions (Addendum)
Please continue: - Glipizide XL 5 mg in a.m. and 2.5 mg before a large dinner only  Stop: - Janumet ER [phone] mg with dinner  Start: - Metformin ER 500 mg with dinner   Please return in 4 months with your sugar log.

## 2019-01-10 ENCOUNTER — Other Ambulatory Visit: Payer: Self-pay | Admitting: Family Medicine

## 2019-01-10 DIAGNOSIS — I10 Essential (primary) hypertension: Secondary | ICD-10-CM

## 2019-01-26 ENCOUNTER — Inpatient Hospital Stay: Payer: Medicare HMO | Attending: Hematology and Oncology

## 2019-01-26 ENCOUNTER — Other Ambulatory Visit: Payer: Self-pay

## 2019-01-26 ENCOUNTER — Inpatient Hospital Stay (HOSPITAL_BASED_OUTPATIENT_CLINIC_OR_DEPARTMENT_OTHER): Payer: Medicare HMO | Admitting: Oncology

## 2019-01-26 VITALS — BP 133/58 | HR 71 | Temp 98.3°F | Resp 18 | Ht 64.0 in | Wt 122.6 lb

## 2019-01-26 DIAGNOSIS — I10 Essential (primary) hypertension: Secondary | ICD-10-CM

## 2019-01-26 DIAGNOSIS — E119 Type 2 diabetes mellitus without complications: Secondary | ICD-10-CM | POA: Diagnosis not present

## 2019-01-26 DIAGNOSIS — D471 Chronic myeloproliferative disease: Secondary | ICD-10-CM

## 2019-01-26 DIAGNOSIS — Z79899 Other long term (current) drug therapy: Secondary | ICD-10-CM

## 2019-01-26 DIAGNOSIS — D45 Polycythemia vera: Secondary | ICD-10-CM | POA: Insufficient documentation

## 2019-01-26 LAB — CBC WITH DIFFERENTIAL (CANCER CENTER ONLY)
Abs Immature Granulocytes: 0.02 10*3/uL (ref 0.00–0.07)
Basophils Absolute: 0 10*3/uL (ref 0.0–0.1)
Basophils Relative: 0 %
Eosinophils Absolute: 0.1 10*3/uL (ref 0.0–0.5)
Eosinophils Relative: 2 %
HCT: 35 % — ABNORMAL LOW (ref 36.0–46.0)
Hemoglobin: 11.5 g/dL — ABNORMAL LOW (ref 12.0–15.0)
Immature Granulocytes: 0 %
Lymphocytes Relative: 27 %
Lymphs Abs: 2.1 10*3/uL (ref 0.7–4.0)
MCH: 35.7 pg — ABNORMAL HIGH (ref 26.0–34.0)
MCHC: 32.9 g/dL (ref 30.0–36.0)
MCV: 108.7 fL — ABNORMAL HIGH (ref 80.0–100.0)
Monocytes Absolute: 0.8 10*3/uL (ref 0.1–1.0)
Monocytes Relative: 10 %
Neutro Abs: 4.6 10*3/uL (ref 1.7–7.7)
Neutrophils Relative %: 61 %
Platelet Count: 419 10*3/uL — ABNORMAL HIGH (ref 150–400)
RBC: 3.22 MIL/uL — ABNORMAL LOW (ref 3.87–5.11)
RDW: 15 % (ref 11.5–15.5)
WBC Count: 7.7 10*3/uL (ref 4.0–10.5)
nRBC: 0 % (ref 0.0–0.2)

## 2019-01-26 NOTE — Progress Notes (Signed)
  [place] OFFICE PROGRESS NOTE   Diagnosis: Polycythemia vera  INTERVAL HISTORY:   Brandi Mccormick returns as scheduled.  She continues hydroxyurea.  No bleeding or symptom of thrombosis.  She reports a "tremor" of the right hand when she tries to write.  This is been present for several months.  No other complaint.  Objective:  Vital signs in last 24 hours:  Blood pressure (!) 133/58, pulse 71, temperature 98.3 F (36.8 C), temperature source Oral, resp. rate 18, height 5\' 4"  (1.626 m), weight 122 lb 9.6 oz (55.6 kg), SpO2 100 %.    GI: No hepatosplenomegaly, nontender Vascular: No leg edema Neuro: Grip strength is intact in the right hand.  The motor exam appears intact in the upper extremities bilaterally, finger-to-nose testing is normal.     Lab Results:  Lab Results  Component Value Date   WBC 7.7 01/26/2019   HGB 11.5 (L) 01/26/2019   HCT 35.0 (L) 01/26/2019   MCV 108.7 (H) 01/26/2019   PLT 419 (H) 01/26/2019   NEUTROABS 4.6 01/26/2019    CMP  Lab Results  Component Value Date   NA 138 12/21/2018   K 4.7 12/21/2018   CL 106 12/21/2018   CO2 23 12/21/2018   GLUCOSE 92 12/21/2018   BUN 19 12/21/2018   CREATININE 1.71 (H) 12/21/2018   CALCIUM 9.3 12/21/2018   PROT 6.7 12/21/2018   ALBUMIN 3.9 12/21/2018   AST 16 12/21/2018   ALT 15 12/21/2018   ALKPHOS 75 12/21/2018   BILITOT 0.4 12/21/2018   GFRNONAA 37 (L) 07/27/2018   GFRAA 43 (L) 07/27/2018     Medications: I have reviewed the patient's current medications.   Assessment/Plan: 1. Polycythemia vera  JAK77mutation (V617F)  Hydroxyurea 500 mg twice daily 06/26/2018  Hydroxyurea decreased to 500 mg daily beginning 07/27/2018  Hydroxyurea placed on hold 08/17/2018  Hydroxyurea resumed at a dose of 500 mg every other day beginning 08/31/2018  Hydrea increased to 500 mg daily beginning 09/21/2018 2. Type 2 diabetes 3. Hypertension 4. Focal/segmental glomerular sclerosis    Disposition: Brandi Mccormick is stable from a hematologic standpoint.  She will continue hydroxyurea.  The hemoglobin is in goal range.  I recommended she follow-up with Dr. Etter Sjogren for evaluation of the right hand tremor.  I also noted the creatinine was elevated when checked by Dr. Etter Sjogren last month.  We will check a chemistry panel when she returns in 3 months.  She will be scheduled for a 2-month office visit.    Betsy Coder, MD  01/26/2019  8:20 AM

## 2019-01-27 ENCOUNTER — Telehealth: Payer: Self-pay | Admitting: Oncology

## 2019-01-27 DIAGNOSIS — R69 Illness, unspecified: Secondary | ICD-10-CM | POA: Diagnosis not present

## 2019-01-27 NOTE — Telephone Encounter (Signed)
Scheduled appt per 5/26 los.  A calendar will be mailed out.

## 2019-02-02 ENCOUNTER — Other Ambulatory Visit: Payer: Self-pay

## 2019-02-02 ENCOUNTER — Ambulatory Visit: Payer: Medicare HMO | Admitting: Podiatry

## 2019-02-02 ENCOUNTER — Encounter: Payer: Self-pay | Admitting: Podiatry

## 2019-02-02 DIAGNOSIS — M79674 Pain in right toe(s): Secondary | ICD-10-CM | POA: Diagnosis not present

## 2019-02-02 DIAGNOSIS — B351 Tinea unguium: Secondary | ICD-10-CM

## 2019-02-02 DIAGNOSIS — M79675 Pain in left toe(s): Secondary | ICD-10-CM | POA: Diagnosis not present

## 2019-02-02 NOTE — Patient Instructions (Signed)
Diabetes Mellitus and Foot Care Foot care is an important part of your health, especially when you have diabetes. Diabetes may cause you to have problems because of poor blood flow (circulation) to your feet and legs, which can cause your skin to:  Become thinner and drier.  Break more easily.  Heal more slowly.  Peel and crack. You may also have nerve damage (neuropathy) in your legs and feet, causing decreased feeling in them. This means that you may not notice minor injuries to your feet that could lead to more serious problems. Noticing and addressing any potential problems early is the best way to prevent future foot problems. How to care for your feet Foot hygiene  Wash your feet daily with warm water and mild soap. Do not use hot water. Then, pat your feet and the areas between your toes until they are completely dry. Do not soak your feet as this can dry your skin.  Trim your toenails straight across. Do not dig under them or around the cuticle. File the edges of your nails with an emery board or nail file.  Apply a moisturizing lotion or petroleum jelly to the skin on your feet and to dry, brittle toenails. Use lotion that does not contain alcohol and is unscented. Do not apply lotion between your toes. Shoes and socks  Wear clean socks or stockings every day. Make sure they are not too tight. Do not wear knee-high stockings since they may decrease blood flow to your legs.  Wear shoes that fit properly and have enough cushioning. Always look in your shoes before you put them on to be sure there are no objects inside.  To break in new shoes, wear them for just a few hours a day. This prevents injuries on your feet. Wounds, scrapes, corns, and calluses  Check your feet daily for blisters, cuts, bruises, sores, and redness. If you cannot see the bottom of your feet, use a mirror or ask someone for help.  Do not cut corns or calluses or try to remove them with medicine.  If you  find a minor scrape, cut, or break in the skin on your feet, keep it and the skin around it clean and dry. You may clean these areas with mild soap and water. Do not clean the area with peroxide, alcohol, or iodine.  If you have a wound, scrape, corn, or callus on your foot, look at it several times a day to make sure it is healing and not infected. Check for: ? Redness, swelling, or pain. ? Fluid or blood. ? Warmth. ? Pus or a bad smell. General instructions  Do not cross your legs. This may decrease blood flow to your feet.  Do not use heating pads or hot water bottles on your feet. They may burn your skin. If you have lost feeling in your feet or legs, you may not know this is happening until it is too late.  Protect your feet from hot and cold by wearing shoes, such as at the beach or on hot pavement.  Schedule a complete foot exam at least once a year (annually) or more often if you have foot problems. If you have foot problems, report any cuts, sores, or bruises to your health care provider immediately. Contact a health care provider if:  You have a medical condition that increases your risk of infection and you have any cuts, sores, or bruises on your feet.  You have an injury that is not   healing.  You have redness on your legs or feet.  You feel burning or tingling in your legs or feet.  You have pain or cramps in your legs and feet.  Your legs or feet are numb.  Your feet always feel cold.  You have pain around a toenail. Get help right away if:  You have a wound, scrape, corn, or callus on your foot and: ? You have pain, swelling, or redness that gets worse. ? You have fluid or blood coming from the wound, scrape, corn, or callus. ? Your wound, scrape, corn, or callus feels warm to the touch. ? You have pus or a bad smell coming from the wound, scrape, corn, or callus. ? You have a fever. ? You have a red line going up your leg. Summary  Check your feet every day  for cuts, sores, red spots, swelling, and blisters.  Moisturize feet and legs daily.  Wear shoes that fit properly and have enough cushioning.  If you have foot problems, report any cuts, sores, or bruises to your health care provider immediately.  Schedule a complete foot exam at least once a year (annually) or more often if you have foot problems. This information is not intended to replace advice given to you by your health care provider. Make sure you discuss any questions you have with your health care provider. Document Released: 08/16/2000 Document Revised: 10/01/2017 Document Reviewed: 09/20/2016 Elsevier Interactive Patient Education  2019 [employer]  Onychomycosis/Fungal Toenails  WHAT IS IT? An infection that lies within the keratin of your nail plate that is caused by a fungus.  WHY ME? Fungal infections affect all ages, sexes, races, and creeds.  There may be many factors that predispose you to a fungal infection such as age, coexisting medical conditions such as diabetes, or an autoimmune disease; stress, medications, fatigue, genetics, etc.  Bottom line: fungus thrives in a warm, moist environment and your shoes offer such a location.  IS IT CONTAGIOUS? Theoretically, yes.  You do not want to share shoes, nail clippers or files with someone who has fungal toenails.  Walking around barefoot in the same room or sleeping in the same bed is unlikely to transfer the organism.  It is important to realize, however, that fungus can spread easily from one nail to the next on the same foot.  HOW DO WE TREAT THIS?  There are several ways to treat this condition.  Treatment may depend on many factors such as age, medications, pregnancy, liver and kidney conditions, etc.  It is best to ask your doctor which options are available to you.  1. No treatment.   Unlike many other medical concerns, you can live with this condition.  However for many people this can be a painful condition and  may lead to ingrown toenails or a bacterial infection.  It is recommended that you keep the nails cut short to help reduce the amount of fungal nail. 2. Topical treatment.  These range from herbal remedies to prescription strength nail lacquers.  About 40-50% effective, topicals require twice daily application for approximately 9 to 12 months or until an entirely new nail has grown out.  The most effective topicals are medical grade medications available through physicians offices. 3. Oral antifungal medications.  With an 80-90% cure rate, the most common oral medication requires 3 to 4 months of therapy and stays in your system for a year as the new nail grows out.  Oral antifungal medications do require   blood work to make sure it is a safe drug for you.  A liver function panel will be performed prior to starting the medication and after the first month of treatment.  It is important to have the blood work performed to avoid any harmful side effects.  In general, this medication safe but blood work is required. 4. Laser Therapy.  This treatment is performed by applying a specialized laser to the affected nail plate.  This therapy is noninvasive, fast, and non-painful.  It is not covered by insurance and is therefore, out of pocket.  The results have been very good with a 80-95% cure rate.  The [facility] is the only practice in the area to offer this therapy. 5. Permanent Nail Avulsion.  Removing the entire nail so that a new nail will not grow back. 

## 2019-02-06 NOTE — Progress Notes (Signed)
Subjective:  Brandi Mccormick presents to clinic today with cc of elongated, painful, thick, discolored, elongated toenails 1-5 b/l that become tender and cannot cut because of thickness.  Pain is aggravated when wearing enclosed shoe gear.  She voices no new pedal concerns on today's visit  [name], DO is her PCP and last visit was 01/26/2019.   Current Outpatient Medications:  .  amLODipine (NORVASC) 10 MG tablet, TAKE 1 TABLET BY MOUTH ONCE DAILY, Disp: 90 tablet, Rfl: 1 .  aspirin EC 81 MG tablet, Take 81 mg by mouth daily., Disp: , Rfl:  .  famotidine (PEPCID) 20 MG tablet, Take 20 mg by mouth daily., Disp: , Rfl:  .  glipiZIDE (GLUCOTROL XL) 5 MG 24 hr tablet, Take 1 tablet (5 mg total) by mouth daily., Disp: 90 tablet, Rfl: 3 .  hydroxyurea (HYDREA) 500 MG capsule, Take 1 capsule (500 mg total) by mouth daily., Disp: 60 capsule, Rfl: 0 .  lisinopril (ZESTRIL) 40 MG tablet, Take 1 tablet by mouth once daily, Disp: 90 tablet, Rfl: 1 .  metFORMIN (GLUCOPHAGE-XR) 500 MG 24 hr tablet, Take 1 tablet (500 mg total) by mouth daily with breakfast., Disp: 90 tablet, Rfl: 3 .  metoprolol succinate (TOPROL-XL) 25 MG 24 hr tablet, Take 1 tablet by mouth once daily, Disp: 90 tablet, Rfl: 0 .  ONETOUCH DELICA LANCETS 45Y MISC, OneTouch Delica Lancets 33 gauge, Disp: , Rfl:  .  ONETOUCH VERIO test strip, USE 1 STRIP TO CHECK GLUCOSE ONCE DAILY, Disp: 50 each, Rfl: 0 .  rosuvastatin (CRESTOR) 40 MG tablet, Take 1 tablet (40 mg total) by mouth daily., Disp: 90 tablet, Rfl: 1   Allergies  Allergen Reactions  . Penicillins Nausea And Vomiting    Has patient had a PCN reaction causing immediate rash, facial/tongue/throat swelling, SOB or lightheadedness with hypotension: no Has patient had a PCN reaction causing severe rash involving mucus membranes or skin necrosis: no Has patient had a PCN reaction that required hospitalization: no Has patient had a PCN reaction occurring within the  last 10 years: no If all of the above answers are "NO", then may proceed with Cephalosporin use.      Objective: There were no vitals filed for this visit.  Physical Examination: 78 y.o AAF, WD, WN in NAD. AAO x 3.  Vascular Examination: Capillary refill time immediate x 10 digits.  DP pulses 2/4 b/l.  PT pulses 1/4 b/l.  Digital hair present b/l.  Skin temperature gradient WNL b/l.  Dermatological Examination: Skin with normal turgor, texture and tone b/l.  No open wounds b/l.  No interdigital macerations noted b/l.  Elongated, thick, discolored brittle toenails with subungual debris and pain on dorsal palpation of nailbeds 1-5 b/l.  Musculoskeletal Examination: Muscle strength 5/5 to all muscle groups b/l.  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  No pain, crepitus or joint discomfort with active/passive ROM.  Neurological Examination: Sensation intact 5/5 b/l with 10 gram monofilament.  Vibratory sensation intact b/l.  Proprioceptive sensation intact b/l.  Assessment: Mycotic nail infection with pain 1-5 b/l  Plan: 1. Toenails 1-5 b/l were debrided in length and girth without iatrogenic laceration. 2.  Continue soft, supportive shoe gear daily. 3.  Report any pedal injuries to medical professional. 4.  Follow up 3 months. 5.  Patient/POA to call should there be a question/concern in there interim.

## 2019-02-08 ENCOUNTER — Other Ambulatory Visit: Payer: Self-pay | Admitting: Family Medicine

## 2019-02-13 ENCOUNTER — Other Ambulatory Visit: Payer: Self-pay | Admitting: Internal Medicine

## 2019-02-13 DIAGNOSIS — R69 Illness, unspecified: Secondary | ICD-10-CM | POA: Diagnosis not present

## 2019-02-15 ENCOUNTER — Other Ambulatory Visit: Payer: Self-pay | Admitting: Internal Medicine

## 2019-02-15 DIAGNOSIS — R69 Illness, unspecified: Secondary | ICD-10-CM | POA: Diagnosis not present

## 2019-03-11 ENCOUNTER — Encounter: Payer: Medicare HMO | Admitting: Family Medicine

## 2019-03-13 ENCOUNTER — Other Ambulatory Visit: Payer: Self-pay | Admitting: Nurse Practitioner

## 2019-04-02 NOTE — Progress Notes (Signed)
Virtual Visit via Video Note  I connected with patient on 04/05/19 at  9:00 AM EDT by audio enabled telemedicine application and verified that I am speaking with the correct person using two identifiers.   THIS ENCOUNTER IS A VIRTUAL VISIT DUE TO COVID-19 - PATIENT WAS NOT SEEN IN THE OFFICE. PATIENT HAS CONSENTED TO VIRTUAL VISIT / TELEMEDICINE VISIT   Location of patient: home  Location of provider: office  I discussed the limitations of evaluation and management by telemedicine and the availability of in person appointments. The patient expressed understanding and agreed to proceed.   Subjective:   Brandi Mccormick is a 78 y.o. female who presents for Medicare Annual (Subsequent) preventive examination.  Review of Systems: No ROS.  Medicare Wellness Virtual Visit.  Visual/audio telehealth visit, UTA vital signs.   See social history for additional risk factors. Cardiac Risk Factors include: advanced age (>70men, >53 women);diabetes mellitus;dyslipidemia;hypertension Sleep patterns: no issues. Home Safety/Smoke Alarms: Feels safe in home. Smoke alarms in place.  Lives with husband in 1 story home. Step over tub.   Female:   Mammo-  Declines      Dexa scan-  declines CCS- due 2023 Eye- DM eye exam yearly. [facility]. Wears glasses. Due now per pt.  Objective:     Advanced Directives 04/05/2019 08/17/2018 07/27/2018 07/08/2018 06/26/2018 06/02/2018 06/02/2018  Does Patient Have a Medical Advance Directive? No No No No No No No  Does patient want to make changes to medical advance directive? No - Patient declined - - - - - -  Would patient like information on creating a medical advance directive? - No - Patient declined - No - Patient declined - - No - Patient declined    Tobacco Social History   Tobacco Use  Smoking Status Never Smoker  Smokeless Tobacco Never Used     Counseling given: Not Answered   Clinical Intake:     Pain : No/denies pain                  Past Medical History:  Diagnosis Date  . Diabetes ([place])   . GERD (gastroesophageal reflux disease)   . Hyperlipidemia   . Hypertension   . Hyperthyroidism   . Pneumonia   . Shingles    Past Surgical History:  Procedure Laterality Date  . COLONOSCOPY    . DENTAL SURGERY     Family History  Problem Relation Age of Onset  . Diabetes Sister   . Diabetes Brother   . Diabetes Sister   . Diabetes Mother        and on mother's side of family  . Colon cancer Mother   . Stroke Mother   . Diabetes Father        and on father's side of family  . Heart disease Neg Hx   . Rectal cancer Neg Hx   . Stomach cancer Neg Hx    Social History   Socioeconomic History  . Marital status: Married    Spouse name: Not on file  . Number of children: Not on file  . Years of education: Not on file  . Highest education level: Not on file  Occupational History  . 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 Financial resource strain: Not on file  . Food insecurity    Worry: Not on file    Inability: Not on file  . Transportation needs    Medical: Not on file  Non-medical: Not on file  Tobacco Use  . Smoking status: Never Smoker  . Smokeless tobacco: Never Used  Substance and Sexual Activity  . Alcohol use: Yes    Alcohol/week: 0.0 standard drinks    Comment: occasional wine cooler  . Drug use: No  . Sexual activity: Never    Partners: Male  Lifestyle  . Physical activity    Days per week: Not on file    Minutes per session: Not on file  . Stress: Not on file  Relationships  . Social Herbalist on phone: Not on file    Gets together: Not on file    Attends religious service: Not on file    Active member of club or organization: Not on file    Attends meetings of clubs or organizations: Not on file    Relationship status: Not on file  Other Topics Concern  . Not on file  Social History Narrative   Exercise-- yard work,  Walking in [place] Encounter Medications as of 04/05/2019  Medication Sig  . amLODipine (NORVASC) 10 MG tablet TAKE 1 TABLET BY MOUTH ONCE DAILY  . aspirin EC 81 MG tablet Take 81 mg by mouth daily.  . famotidine (PEPCID) 20 MG tablet Take 20 mg by mouth daily.  Marland Kitchen glipiZIDE (GLUCOTROL XL) 5 MG 24 hr tablet Take 1 tablet (5 mg total) by mouth daily.  . hydroxyurea (HYDREA) 500 MG capsule Take 1 capsule by mouth once daily  . lisinopril (ZESTRIL) 40 MG tablet Take 1 tablet by mouth once daily  . metFORMIN (GLUCOPHAGE-XR) 500 MG 24 hr tablet Take 1 tablet (500 mg total) by mouth daily with breakfast.  . metoprolol succinate (TOPROL-XL) 25 MG 24 hr tablet Take 1 tablet by mouth once daily  . ONETOUCH DELICA LANCETS 63O MISC OneTouch Delica Lancets 33 gauge  . ONETOUCH VERIO test strip USE 1 STRIP TO CHECK GLUCOSE ONCE DAILY  . rosuvastatin (CRESTOR) 40 MG tablet Take 1 tablet (40 mg total) by mouth daily.   No facility-administered encounter medications on file as of 04/05/2019.     Activities of Daily Living In your present state of health, do you have any difficulty performing the following activities: 04/05/2019  Hearing? N  Vision? N  Difficulty concentrating or making decisions? N  Walking or climbing stairs? N  Dressing or bathing? N  Doing errands, shopping? N  Preparing Food and eating ? N  Using the Toilet? N  In the past six months, have you accidently leaked urine? N  Do you have problems with loss of bowel control? N  Managing your Medications? N  Managing your Finances? N  Housekeeping or managing your Housekeeping? N  Some recent data might be hidden    Patient Care Team: Carollee Herter, Alferd Apa, DO as PCP - General (Family Medicine) Philemon Kingdom, MD as Consulting Physician (Internal Medicine) [facility] as Consulting Physician (Podiatry) Justin Mend, MD as Attending Physician (Internal Medicine) [name], DPM as Consulting  Physician (Podiatry)    Assessment:   This is a routine wellness examination for Brandi Mccormick. Physical assessment deferred to PCP.  Exercise Activities and Dietary recommendations Current Exercise Habits: The patient does not participate in regular exercise at present, Exercise limited by: None identified Diet (meal preparation, eat out, water intake, caffeinated beverages, dairy products, fruits and vegetables): well balanced, on average, 2 meals per day   Goals    .  DIET - INCREASE WATER INTAKE    . Have 3 meals a day     At lunch add maybe Glucerna, Smoothies with protein, Sandwiches with lean protein/veggies, etc.     . Reduce sugar intake to 25 grams per day     Trade cake and pie for fruit.         Fall Risk Fall Risk  04/05/2019 04/02/2018 03/21/2017 06/23/2015 06/23/2015  Falls in the past year? 0 No No No No  Risk for fall due to : - - - - -    Depression Screen PHQ 2/9 Scores 04/05/2019 04/02/2018 03/21/2017 06/23/2015  PHQ - 2 Score 0 0 0 0     Cognitive Function Ad8 score reviewed for issues:  Issues making decisions:no  Less interest in hobbies / activities:no  Repeats questions, stories (family complaining):no  Trouble using ordinary gadgets (microwave, computer, phone):no  Forgets the month or year: no  Mismanaging finances: no  Remembering appts:no  Daily problems with thinking and/or memory:no Ad8 score is=0     MMSE - Mini Mental State Exam 04/02/2018 03/21/2017 04/24/2015  Orientation to time 5 5 5   Orientation to Place 5 5 5   Registration 3 3 3   Attention/ Calculation 4 3 4   Recall 2 2 2   Language- name 2 objects 2 2 2   Language- repeat 1 1 1   Language- follow 3 step command 3 3 3   Language- read & follow direction 1 1 1   Write a sentence 1 1 1   Copy design 1 1 1   Total score 28 27 28         Immunization History  Administered Date(s) Administered  . Pneumococcal Polysaccharide-23 11/16/2013    Screening Tests Health Maintenance  Topic  Date Due  . PNA vac Low Risk Adult (2 of 2 - PCV13) 11/17/2014  . OPHTHALMOLOGY EXAM  12/14/2018  . INFLUENZA VACCINE  04/03/2019  . HEMOGLOBIN A1C  06/22/2019  . FOOT EXAM  09/12/2019  . COLONOSCOPY  02/05/2022  . TETANUS/TDAP  06/22/2025  . DEXA SCAN  Addressed      Plan:   See you next year.   Continue to eat heart healthy diet (full of fruits, vegetables, whole grains, lean protein, water--limit salt, fat, and sugar intake) and increase physical activity as tolerated.  Continue doing brain stimulating activities (puzzles, reading, adult coloring books, staying active) to keep memory sharp.   Please schedule eye exam.   I have personally reviewed and noted the following in the patient's chart:   . Medical and social history . Use of alcohol, tobacco or illicit drugs  . Current medications and supplements . Functional ability and status . Nutritional status . Physical activity . Advanced directives . List of other physicians . Hospitalizations, surgeries, and ER visits in previous 12 months . Vitals . Screenings to include cognitive, depression, and falls . Referrals and appointments  In addition, I have reviewed and discussed with patient certain preventive protocols, quality metrics, and best practice recommendations. A written personalized care plan for preventive services as well as general preventive health recommendations were provided to patient.     Shela Nevin, [place]  04/05/2019

## 2019-04-03 ENCOUNTER — Other Ambulatory Visit: Payer: Self-pay | Admitting: Family Medicine

## 2019-04-04 DIAGNOSIS — R69 Illness, unspecified: Secondary | ICD-10-CM | POA: Diagnosis not present

## 2019-04-05 ENCOUNTER — Other Ambulatory Visit: Payer: Self-pay

## 2019-04-05 ENCOUNTER — Ambulatory Visit (INDEPENDENT_AMBULATORY_CARE_PROVIDER_SITE_OTHER): Payer: Medicare HMO | Admitting: *Deleted

## 2019-04-05 ENCOUNTER — Encounter: Payer: Self-pay | Admitting: *Deleted

## 2019-04-05 DIAGNOSIS — Z0000 Encounter for general adult medical examination without abnormal findings: Secondary | ICD-10-CM | POA: Diagnosis not present

## 2019-04-05 NOTE — Patient Instructions (Signed)
See you next year.   Continue to eat heart healthy diet (full of fruits, vegetables, whole grains, lean protein, water--limit salt, fat, and sugar intake) and increase physical activity as tolerated.  Continue doing brain stimulating activities (puzzles, reading, adult coloring books, staying active) to keep memory sharp.   Please schedule eye exam.    Brandi Mccormick , Thank you for taking time to come for your Medicare Wellness Visit. I appreciate your ongoing commitment to your health goals. Please review the following plan we discussed and let me know if I can assist you in the future.   These are the goals we discussed: Goals    . DIET - INCREASE WATER INTAKE    . Have 3 meals a day     At lunch add maybe Glucerna, Smoothies with protein, Sandwiches with lean protein/veggies, etc.     . Reduce sugar intake to 25 grams per day     Trade cake and pie for fruit.         This is a list of the screening recommended for you and due dates:  Health Maintenance  Topic Date Due  . Pneumonia vaccines (2 of 2 - PCV13) 11/17/2014  . Eye exam for diabetics  12/14/2018  . Flu Shot  04/03/2019  . Hemoglobin A1C  06/22/2019  . Complete foot exam   09/12/2019  . Colon Cancer Screening  02/05/2022  . Tetanus Vaccine  06/22/2025  . DEXA scan (bone density measurement)  Addressed    Health Maintenance After Age 9 After age 7, you are at a higher risk for certain long-term diseases and infections as well as injuries from falls. Falls are a major cause of broken bones and head injuries in people who are older than age 23. Getting regular preventive care can help to keep you healthy and well. Preventive care includes getting regular testing and making lifestyle changes as recommended by your health care provider. Talk with your health care provider about:  Which screenings and tests you should have. A screening is a test that checks for a disease when you have no symptoms.  A diet and exercise  plan that is right for you. What should I know about screenings and tests to prevent falls? Screening and testing are the best ways to find a health problem early. Early diagnosis and treatment give you the best chance of managing medical conditions that are common after age 32. Certain conditions and lifestyle choices may make you more likely to have a fall. Your health care provider may recommend:  Regular vision checks. Poor vision and conditions such as cataracts can make you more likely to have a fall. If you wear glasses, make sure to get your prescription updated if your vision changes.  Medicine review. Work with your health care provider to regularly review all of the medicines you are taking, including over-the-counter medicines. Ask your health care provider about any side effects that may make you more likely to have a fall. Tell your health care provider if any medicines that you take make you feel dizzy or sleepy.  Osteoporosis screening. Osteoporosis is a condition that causes the bones to get weaker. This can make the bones weak and cause them to break more easily.  Blood pressure screening. Blood pressure changes and medicines to control blood pressure can make you feel dizzy.  Strength and balance checks. Your health care provider may recommend certain tests to check your strength and balance while standing, walking, or changing  positions.  Foot health exam. Foot pain and numbness, as well as not wearing proper footwear, can make you more likely to have a fall.  Depression screening. You may be more likely to have a fall if you have a fear of falling, feel emotionally low, or feel unable to do activities that you used to do.  Alcohol use screening. Using too much alcohol can affect your balance and may make you more likely to have a fall. What actions can I take to lower my risk of falls? General instructions  Talk with your health care provider about your risks for falling.  Tell your health care provider if: ? You fall. Be sure to tell your health care provider about all falls, even ones that seem minor. ? You feel dizzy, sleepy, or off-balance.  Take over-the-counter and prescription medicines only as told by your health care provider. These include any supplements.  Eat a healthy diet and maintain a healthy weight. A healthy diet includes low-fat dairy products, low-fat (lean) meats, and fiber from whole grains, beans, and lots of fruits and vegetables. Home safety  Remove any tripping hazards, such as rugs, cords, and clutter.  Install safety equipment such as grab bars in bathrooms and safety rails on stairs.  Keep rooms and walkways well-lit. Activity   Follow a regular exercise program to stay fit. This will help you maintain your balance. Ask your health care provider what types of exercise are appropriate for you.  If you need a cane or walker, use it as recommended by your health care provider.  Wear supportive shoes that have nonskid soles. Lifestyle  Do not drink alcohol if your health care provider tells you not to drink.  If you drink alcohol, limit how much you have: ? 0-1 drink a day for women. ? 0-2 drinks a day for men.  Be aware of how much alcohol is in your drink. In the [place] one drink equals one typical bottle of beer (12 oz), one-half glass of wine (5 oz), or one shot of hard liquor (1 oz).  Do not use any products that contain nicotine or tobacco, such as cigarettes and e-cigarettes. If you need help quitting, ask your health care provider. Summary  Having a healthy lifestyle and getting preventive care can help to protect your health and wellness after age 71.  Screening and testing are the best way to find a health problem early and help you avoid having a fall. Early diagnosis and treatment give you the best chance for managing medical conditions that are more common for people who are older than age 26.  Falls are a  major cause of broken bones and head injuries in people who are older than age 91. Take precautions to prevent a fall at home.  Work with your health care provider to learn what changes you can make to improve your health and wellness and to prevent falls. This information is not intended to replace advice given to you by your health care provider. Make sure you discuss any questions you have with your health care provider. Document Released: 07/02/2017 Document Revised: 12/10/2018 Document Reviewed: 07/02/2017 Elsevier Patient Education  2020 [employer].

## 2019-04-07 DIAGNOSIS — I129 Hypertensive chronic kidney disease with stage 1 through stage 4 chronic kidney disease, or unspecified chronic kidney disease: Secondary | ICD-10-CM | POA: Diagnosis not present

## 2019-04-07 DIAGNOSIS — E1129 Type 2 diabetes mellitus with other diabetic kidney complication: Secondary | ICD-10-CM | POA: Diagnosis not present

## 2019-04-07 DIAGNOSIS — N051 Unspecified nephritic syndrome with focal and segmental glomerular lesions: Secondary | ICD-10-CM | POA: Diagnosis not present

## 2019-04-07 DIAGNOSIS — E785 Hyperlipidemia, unspecified: Secondary | ICD-10-CM | POA: Diagnosis not present

## 2019-04-07 DIAGNOSIS — D473 Essential (hemorrhagic) thrombocythemia: Secondary | ICD-10-CM | POA: Diagnosis not present

## 2019-04-28 ENCOUNTER — Inpatient Hospital Stay: Payer: Medicare HMO | Attending: Hematology and Oncology

## 2019-04-28 ENCOUNTER — Telehealth: Payer: Self-pay | Admitting: *Deleted

## 2019-04-28 ENCOUNTER — Other Ambulatory Visit: Payer: Self-pay

## 2019-04-28 DIAGNOSIS — D471 Chronic myeloproliferative disease: Secondary | ICD-10-CM

## 2019-04-28 DIAGNOSIS — D45 Polycythemia vera: Secondary | ICD-10-CM | POA: Insufficient documentation

## 2019-04-28 LAB — CBC WITH DIFFERENTIAL (CANCER CENTER ONLY)
Abs Immature Granulocytes: 0.02 10*3/uL (ref 0.00–0.07)
Basophils Absolute: 0 10*3/uL (ref 0.0–0.1)
Basophils Relative: 1 %
Eosinophils Absolute: 0.1 10*3/uL (ref 0.0–0.5)
Eosinophils Relative: 1 %
HCT: 32.9 % — ABNORMAL LOW (ref 36.0–46.0)
Hemoglobin: 11.1 g/dL — ABNORMAL LOW (ref 12.0–15.0)
Immature Granulocytes: 0 %
Lymphocytes Relative: 35 %
Lymphs Abs: 2.2 10*3/uL (ref 0.7–4.0)
MCH: 36 pg — ABNORMAL HIGH (ref 26.0–34.0)
MCHC: 33.7 g/dL (ref 30.0–36.0)
MCV: 106.8 fL — ABNORMAL HIGH (ref 80.0–100.0)
Monocytes Absolute: 0.6 10*3/uL (ref 0.1–1.0)
Monocytes Relative: 9 %
Neutro Abs: 3.4 10*3/uL (ref 1.7–7.7)
Neutrophils Relative %: 54 %
Platelet Count: 416 10*3/uL — ABNORMAL HIGH (ref 150–400)
RBC: 3.08 MIL/uL — ABNORMAL LOW (ref 3.87–5.11)
RDW: 14.9 % (ref 11.5–15.5)
WBC Count: 6.3 10*3/uL (ref 4.0–10.5)
nRBC: 0 % (ref 0.0–0.2)

## 2019-04-28 LAB — CMP (CANCER CENTER ONLY)
ALT: 29 U/L (ref 0–44)
AST: 22 U/L (ref 15–41)
Albumin: 3.6 g/dL (ref 3.5–5.0)
Alkaline Phosphatase: 77 U/L (ref 38–126)
Anion gap: 9 (ref 5–15)
BUN: 32 mg/dL — ABNORMAL HIGH (ref 8–23)
CO2: 21 mmol/L — ABNORMAL LOW (ref 22–32)
Calcium: 9.3 mg/dL (ref 8.9–10.3)
Chloride: 111 mmol/L (ref 98–111)
Creatinine: 2.01 mg/dL — ABNORMAL HIGH (ref 0.44–1.00)
GFR, Est AFR Am: 27 mL/min — ABNORMAL LOW (ref >60)
GFR, Estimated: 23 mL/min — ABNORMAL LOW (ref >60)
Glucose, Bld: 118 mg/dL — ABNORMAL HIGH (ref 70–99)
Potassium: 4.6 mmol/L (ref 3.5–5.1)
Sodium: 141 mmol/L (ref 135–145)
Total Bilirubin: 0.4 mg/dL (ref 0.3–1.2)
Total Protein: 7 g/dL (ref 6.5–8.1)

## 2019-04-28 NOTE — Telephone Encounter (Signed)
-----   Message from Ladell Pier, MD sent at 04/28/2019  1:59 PM EDT ----- Please call patient, white count and hemoglobin are in goal range, continue hydroxyurea, repeat CBC and BMP in 6 weeks  Please copy the chemistry panel to Dr. Etter Sjogren, she has developed progressive renal failure

## 2019-04-28 NOTE — Telephone Encounter (Signed)
Notified of CBC and CMP results. Continue hydrea and repeat labs in 6 weeks. Scheduling message sent and requested am appointments. Faxed copy of CMP to Dr. Etter Sjogren per Dr. Benay Spice request.

## 2019-04-29 ENCOUNTER — Telehealth: Payer: Self-pay | Admitting: Nurse Practitioner

## 2019-04-29 NOTE — Telephone Encounter (Signed)
Scheduled appt per 8/26 sch message- pt is aware of appt date and time  

## 2019-05-06 ENCOUNTER — Ambulatory Visit (INDEPENDENT_AMBULATORY_CARE_PROVIDER_SITE_OTHER): Payer: Medicare HMO | Admitting: Family Medicine

## 2019-05-06 ENCOUNTER — Encounter: Payer: Self-pay | Admitting: Family Medicine

## 2019-05-06 ENCOUNTER — Other Ambulatory Visit: Payer: Self-pay

## 2019-05-06 VITALS — BP 150/80 | HR 70 | Temp 97.7°F | Resp 18 | Ht 64.0 in | Wt 122.0 lb

## 2019-05-06 DIAGNOSIS — Z0000 Encounter for general adult medical examination without abnormal findings: Secondary | ICD-10-CM | POA: Diagnosis not present

## 2019-05-06 DIAGNOSIS — E1169 Type 2 diabetes mellitus with other specified complication: Secondary | ICD-10-CM | POA: Diagnosis not present

## 2019-05-06 DIAGNOSIS — I10 Essential (primary) hypertension: Secondary | ICD-10-CM | POA: Diagnosis not present

## 2019-05-06 DIAGNOSIS — E1165 Type 2 diabetes mellitus with hyperglycemia: Secondary | ICD-10-CM | POA: Diagnosis not present

## 2019-05-06 DIAGNOSIS — E1151 Type 2 diabetes mellitus with diabetic peripheral angiopathy without gangrene: Secondary | ICD-10-CM

## 2019-05-06 DIAGNOSIS — E785 Hyperlipidemia, unspecified: Secondary | ICD-10-CM | POA: Diagnosis not present

## 2019-05-06 DIAGNOSIS — IMO0002 Reserved for concepts with insufficient information to code with codable children: Secondary | ICD-10-CM

## 2019-05-06 LAB — MICROALBUMIN / CREATININE URINE RATIO
Creatinine,U: 103.1 mg/dL
Microalb Creat Ratio: 77.6 mg/g — ABNORMAL HIGH (ref 0.0–30.0)
Microalb, Ur: 80 mg/dL — ABNORMAL HIGH (ref 0.0–1.9)

## 2019-05-06 LAB — LIPID PANEL
Cholesterol: 223 mg/dL — ABNORMAL HIGH (ref 0–200)
HDL: 62.7 mg/dL (ref >39.00)
LDL Cholesterol: 135 mg/dL — ABNORMAL HIGH (ref 0–99)
NonHDL: 160.75
Total CHOL/HDL Ratio: 4
Triglycerides: 128 mg/dL (ref 0.0–149.0)
VLDL: 25.6 mg/dL (ref 0.0–40.0)

## 2019-05-06 LAB — CBC WITH DIFFERENTIAL/PLATELET
Basophils Absolute: 0.1 10*3/uL (ref 0.0–0.1)
Basophils Relative: 0.6 % (ref 0.0–3.0)
Eosinophils Absolute: 0.1 10*3/uL (ref 0.0–0.7)
Eosinophils Relative: 1 % (ref 0.0–5.0)
HCT: 35.1 % — ABNORMAL LOW (ref 36.0–46.0)
Hemoglobin: 11.8 g/dL — ABNORMAL LOW (ref 12.0–15.0)
Lymphocytes Relative: 33.7 % (ref 12.0–46.0)
Lymphs Abs: 2.9 10*3/uL (ref 0.7–4.0)
MCHC: 33.5 g/dL (ref 30.0–36.0)
MCV: 109.7 fl — ABNORMAL HIGH (ref 78.0–100.0)
Monocytes Absolute: 0.8 10*3/uL (ref 0.1–1.0)
Monocytes Relative: 8.9 % (ref 3.0–12.0)
Neutro Abs: 4.8 10*3/uL (ref 1.4–7.7)
Neutrophils Relative %: 55.8 % (ref 43.0–77.0)
Platelets: 489 10*3/uL — ABNORMAL HIGH (ref 150.0–400.0)
RBC: 3.2 Mil/uL — ABNORMAL LOW (ref 3.87–5.11)
RDW: 16.2 % — ABNORMAL HIGH (ref 11.5–15.5)
WBC: 8.5 10*3/uL (ref 4.0–10.5)

## 2019-05-06 LAB — COMPREHENSIVE METABOLIC PANEL
ALT: 19 U/L (ref 0–35)
AST: 19 U/L (ref 0–37)
Albumin: 4.2 g/dL (ref 3.5–5.2)
Alkaline Phosphatase: 67 U/L (ref 39–117)
BUN: 33 mg/dL — ABNORMAL HIGH (ref 6–23)
CO2: 23 mEq/L (ref 19–32)
Calcium: 9.6 mg/dL (ref 8.4–10.5)
Chloride: 110 mEq/L (ref 96–112)
Creatinine, Ser: 2.04 mg/dL — ABNORMAL HIGH (ref 0.40–1.20)
GFR: 23.58 mL/min — ABNORMAL LOW (ref >60.00)
Glucose, Bld: 60 mg/dL — ABNORMAL LOW (ref 70–99)
Potassium: 5 mEq/L (ref 3.5–5.1)
Sodium: 141 mEq/L (ref 135–145)
Total Bilirubin: 0.4 mg/dL (ref 0.2–1.2)
Total Protein: 7.1 g/dL (ref 6.0–8.3)

## 2019-05-06 LAB — HEMOGLOBIN A1C: Hgb A1c MFr Bld: 6.6 % — ABNORMAL HIGH (ref 4.6–6.5)

## 2019-05-06 MED ORDER — ROSUVASTATIN CALCIUM 40 MG PO TABS: 40.0000 mg | ORAL_TABLET | Freq: Every day | ORAL | 1 refills | Status: DC

## 2019-05-06 NOTE — Assessment & Plan Note (Signed)
Well controlled, no changes to meds. Encouraged heart healthy diet such as the DASH diet and exercise as tolerated.  °

## 2019-05-06 NOTE — Progress Notes (Signed)
Subjective:     Brandi Mccormick is a 78 y.o. female and is here for a comprehensive physical exam. The patient reports no problems.    DIABETES    Blood Sugar ranges-130-140  Polyuria- no New Visual problems- no  Hypoglycemic symptoms- no  Other side effects-no Medication compliance - good Last eye exam- due Foot exam- today   HYPERLIPIDEMIA  Medication compliance- na RUQ pain- no  Muscle aches- no Other side effects-no    ] Social History   Socioeconomic History  . Marital status: Married    Spouse name: Not on file  . Number of children: Not on file  . Years of education: Not on file  . Highest education level: Not on file  Occupational History  . 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 Financial resource strain: Not on file  . Food insecurity    Worry: Not on file    Inability: Not on file  . Transportation needs    Medical: Not on file    Non-medical: Not on file  Tobacco Use  . Smoking status: Never Smoker  . Smokeless tobacco: Never Used  Substance and Sexual Activity  . Alcohol use: Yes    Alcohol/week: 0.0 standard drinks    Comment: occasional wine cooler  . Drug use: No  . Sexual activity: Never    Partners: Male  Lifestyle  . Physical activity    Days per week: Not on file    Minutes per session: Not on file  . Stress: Not on file  Relationships  . Social Herbalist on phone: Not on file    Gets together: Not on file    Attends religious service: Not on file    Active member of club or organization: Not on file    Attends meetings of clubs or organizations: Not on file    Relationship status: Not on file  . Intimate partner violence    Fear of current or ex partner: Not on file    Emotionally abused: Not on file    Physically abused: Not on file    Forced sexual activity: Not on file  Other Topics Concern  . Not on file  Social History Narrative   Exercise-- yard work,  Walking in [place] Date Due  . PNA vac Low Risk Adult (2 of 2 - PCV13) 11/17/2014  . OPHTHALMOLOGY EXAM  12/14/2018  . INFLUENZA VACCINE  04/03/2019  . HEMOGLOBIN A1C  06/22/2019  . FOOT EXAM  09/12/2019  . COLONOSCOPY  02/05/2022  . TETANUS/TDAP  06/22/2025  . DEXA SCAN  Addressed    The following portions of the patient's history were reviewed and updated as appropriate:  She  has a past medical history of Diabetes ([place]), GERD (gastroesophageal reflux disease), Hyperlipidemia, Hypertension, Hyperthyroidism, Pneumonia, and Shingles. She does not have any pertinent problems on file. She  has a past surgical history that includes Dental surgery and Colonoscopy. Her family history includes Colon cancer in her mother; Diabetes in her brother, father, mother, sister, and sister; Stroke in her mother. She  reports that she has never smoked. She has never used smokeless tobacco. She reports current alcohol use. She reports that she does not use drugs. She has a current medication list which includes the following prescription(s): amlodipine, aspirin ec, famotidine, glipizide, hydroxyurea, lisinopril, metformin, metoprolol succinate, onetouch delica lancets 87O, onetouch verio, and rosuvastatin. Current Outpatient Medications on  File Prior to Visit  Medication Sig Dispense Refill  . amLODipine (NORVASC) 10 MG tablet Take 1 tablet by mouth once daily 90 tablet 0  . aspirin EC 81 MG tablet Take 81 mg by mouth daily.    . famotidine (PEPCID) 20 MG tablet Take 20 mg by mouth daily.    Marland Kitchen glipiZIDE (GLUCOTROL XL) 5 MG 24 hr tablet Take 1 tablet (5 mg total) by mouth daily. 90 tablet 3  . hydroxyurea (HYDREA) 500 MG capsule Take 1 capsule by mouth once daily 60 capsule 0  . lisinopril (ZESTRIL) 40 MG tablet Take 1 tablet by mouth once daily 90 tablet 1  . metFORMIN (GLUCOPHAGE-XR) 500 MG 24 hr tablet Take 1 tablet (500 mg total) by mouth daily with breakfast. 90 tablet 3  . metoprolol succinate  (TOPROL-XL) 25 MG 24 hr tablet Take 1 tablet by mouth once daily 90 tablet 0  . ONETOUCH DELICA LANCETS 57Q MISC OneTouch Delica Lancets 33 gauge    . ONETOUCH VERIO test strip USE 1 STRIP TO CHECK GLUCOSE ONCE DAILY 50 each 0   No current facility-administered medications on file prior to visit.    She is allergic to penicillins..  Review of Systems Review of Systems  Constitutional: Negative for activity change, appetite change and fatigue.  HENT: Negative for hearing loss, congestion, tinnitus and ear discharge.  dentist q78m Eyes: Negative for visual disturbance (see optho q1y -- vision corrected to 20/20 with glasses).  Respiratory: Negative for cough, chest tightness and shortness of breath.   Cardiovascular: Negative for chest pain, palpitations and leg swelling.  Gastrointestinal: Negative for abdominal pain, diarrhea, constipation and abdominal distention.  Genitourinary: Negative for urgency, frequency, decreased urine volume and difficulty urinating.  Musculoskeletal: Negative for back pain, arthralgias and gait problem.  Skin: Negative for color change, pallor and rash.  Neurological: Negative for dizziness, light-headedness, numbness and headaches.  Hematological: Negative for adenopathy. Does not bruise/bleed easily.  Psychiatric/Behavioral: Negative for suicidal ideas, confusion, sleep disturbance, self-injury, dysphoric mood, decreased concentration and agitation.       Objective:    BP (!) 150/80 (BP Location: Right Arm, Patient Position: Sitting, Cuff Size: Normal)   Pulse 70   Temp 97.7 F (36.5 C) (Temporal)   Resp 18   Ht 5\' 4"  (1.626 m)   Wt 122 lb (55.3 kg)   SpO2 97%   BMI 20.94 kg/m  General appearance: alert, cooperative, appears stated age and no distress Head: Normocephalic, without obvious abnormality, atraumatic Eyes: conjunctivae/corneas clear. PERRL, EOM's intact. Fundi benign. Ears: normal TM's and external ear canals both ears Nose: Nares  normal. Septum midline. Mucosa normal. No drainage or sinus tenderness. Throat: lips, mucosa, and tongue normal; teeth and gums normal Neck: no adenopathy, no carotid bruit, no JVD, supple, symmetrical, trachea midline and thyroid not enlarged, symmetric, no tenderness/mass/nodules Back: symmetric, no curvature. ROM normal. No CVA tenderness. Lungs: clear to auscultation bilaterally Breasts: normal appearance, no masses or tenderness Heart: regular rate and rhythm, S1, S2 normal, no murmur, click, rub or gallop Abdomen: soft, non-tender; bowel sounds normal; no masses,  no organomegaly Pelvic: not indicated; post-menopausal, no abnormal Pap smears in past Extremities: extremities normal, atraumatic, no cyanosis or edema Pulses: 2+ and symmetric Skin: Skin color, texture, turgor normal. No rashes or lesions Lymph nodes: Cervical, supraclavicular, and axillary nodes normal. Neurologic: Alert and oriented X 3, normal strength and tone. Normal symmetric reflexes. Normal coordination and gait    Assessment:    Healthy female  exam.      Plan:    ghm utd Check labs   See After Visit Summary for Counseling Recommendations    1

## 2019-05-06 NOTE — Assessment & Plan Note (Signed)
hgba1c to be checked minimize simple carbs. Increase exercise as tolerated. Continue current meds 

## 2019-05-06 NOTE — Assessment & Plan Note (Signed)
Tolerating statin, encouraged heart healthy diet, avoid trans fats, minimize simple carbs and saturated fats. Increase exercise as tolerated 

## 2019-05-06 NOTE — Patient Instructions (Signed)
Preventive Care 38 Years and Older, Female Preventive care refers to lifestyle choices and visits with your health care provider that can promote health and wellness. This includes:  A yearly physical exam. This is also called an annual well check.  Regular dental and eye exams.  Immunizations.  Screening for certain conditions.  Healthy lifestyle choices, such as diet and exercise. What can I expect for my preventive care visit? Physical exam Your health care provider will check:  Height and weight. These may be used to calculate body mass index (BMI), which is a measurement that tells if you are at a healthy weight.  Heart rate and blood pressure.  Your skin for abnormal spots. Counseling Your health care provider may ask you questions about:  Alcohol, tobacco, and drug use.  Emotional well-being.  Home and relationship well-being.  Sexual activity.  Eating habits.  History of falls.  Memory and ability to understand (cognition).  Work and work Statistician.  Pregnancy and menstrual history. What immunizations do I need?  Influenza (flu) vaccine  This is recommended every year. Tetanus, diphtheria, and pertussis (Tdap) vaccine  You may need a Td booster every 10 years. Varicella (chickenpox) vaccine  You may need this vaccine if you have not already been vaccinated. Zoster (shingles) vaccine  You may need this after age 33. Pneumococcal conjugate (PCV13) vaccine  One dose is recommended after age 33. Pneumococcal polysaccharide (PPSV23) vaccine  One dose is recommended after age 72. Measles, mumps, and rubella (MMR) vaccine  You may need at least one dose of MMR if you were born in 1957 or later. You may also need a second dose. Meningococcal conjugate (MenACWY) vaccine  You may need this if you have certain conditions. Hepatitis A vaccine  You may need this if you have certain conditions or if you travel or work in places where you may be exposed  to hepatitis A. Hepatitis B vaccine  You may need this if you have certain conditions or if you travel or work in places where you may be exposed to hepatitis B. Haemophilus influenzae type b (Hib) vaccine  You may need this if you have certain conditions. You may receive vaccines as individual doses or as more than one vaccine together in one shot (combination vaccines). Talk with your health care provider about the risks and benefits of combination vaccines. What tests do I need? Blood tests  Lipid and cholesterol levels. These may be checked every 5 years, or more frequently depending on your overall health.  Hepatitis C test.  Hepatitis B test. Screening  Lung cancer screening. You may have this screening every year starting at age 39 if you have a 30-pack-year history of smoking and currently smoke or have quit within the past 15 years.  Colorectal cancer screening. All adults should have this screening starting at age 36 and continuing until age 15. Your health care provider may recommend screening at age 23 if you are at increased risk. You will have tests every 1-10 years, depending on your results and the type of screening test.  Diabetes screening. This is done by checking your blood sugar (glucose) after you have not eaten for a while (fasting). You may have this done every 1-3 years.  Mammogram. This may be done every 1-2 years. Talk with your health care provider about how often you should have regular mammograms.  BRCA-related cancer screening. This may be done if you have a family history of breast, ovarian, tubal, or peritoneal cancers.  Other tests  Sexually transmitted disease (STD) testing.  Bone density scan. This is done to screen for osteoporosis. You may have this done starting at age 55. Follow these instructions at home: Eating and drinking  Eat a diet that includes fresh fruits and vegetables, whole grains, lean protein, and low-fat dairy products. Limit  your intake of foods with high amounts of sugar, saturated fats, and salt.  Take vitamin and mineral supplements as recommended by your health care provider.  Do not drink alcohol if your health care provider tells you not to drink.  If you drink alcohol: ? Limit how much you have to 0-1 drink a day. ? Be aware of how much alcohol is in your drink. In the [place] one drink equals one 12 oz bottle of beer (355 mL), one 5 oz glass of wine (148 mL), or one 1 oz glass of hard liquor (44 mL). Lifestyle  Take daily care of your teeth and gums.  Stay active. Exercise for at least 30 minutes on 5 or more days each week.  Do not use any products that contain nicotine or tobacco, such as cigarettes, e-cigarettes, and chewing tobacco. If you need help quitting, ask your health care provider.  If you are sexually active, practice safe sex. Use a condom or other form of protection in order to prevent STIs (sexually transmitted infections).  Talk with your health care provider about taking a low-dose aspirin or statin. What's next?  Go to your health care provider once a year for a well check visit.  Ask your health care provider how often you should have your eyes and teeth checked.  Stay up to date on all vaccines. This information is not intended to replace advice given to you by your health care provider. Make sure you discuss any questions you have with your health care provider. Document Released: 09/15/2015 Document Revised: 08/13/2018 Document Reviewed: 08/13/2018 Elsevier Patient Education  2020 [employer].

## 2019-05-07 ENCOUNTER — Ambulatory Visit: Payer: Medicare HMO | Admitting: Podiatry

## 2019-05-07 ENCOUNTER — Encounter: Payer: Self-pay | Admitting: Podiatry

## 2019-05-07 DIAGNOSIS — B351 Tinea unguium: Secondary | ICD-10-CM | POA: Diagnosis not present

## 2019-05-07 DIAGNOSIS — M79674 Pain in right toe(s): Secondary | ICD-10-CM

## 2019-05-07 DIAGNOSIS — M79675 Pain in left toe(s): Secondary | ICD-10-CM

## 2019-05-07 DIAGNOSIS — E119 Type 2 diabetes mellitus without complications: Secondary | ICD-10-CM | POA: Diagnosis not present

## 2019-05-07 NOTE — Patient Instructions (Signed)
Diabetes Mellitus and Foot Care Foot care is an important part of your health, especially when you have diabetes. Diabetes may cause you to have problems because of poor blood flow (circulation) to your feet and legs, which can cause your skin to:  Become thinner and drier.  Break more easily.  Heal more slowly.  Peel and crack. You may also have nerve damage (neuropathy) in your legs and feet, causing decreased feeling in them. This means that you may not notice minor injuries to your feet that could lead to more serious problems. Noticing and addressing any potential problems early is the best way to prevent future foot problems. How to care for your feet Foot hygiene  Wash your feet daily with warm water and mild soap. Do not use hot water. Then, pat your feet and the areas between your toes until they are completely dry. Do not soak your feet as this can dry your skin.  Trim your toenails straight across. Do not dig under them or around the cuticle. File the edges of your nails with an emery board or nail file.  Apply a moisturizing lotion or petroleum jelly to the skin on your feet and to dry, brittle toenails. Use lotion that does not contain alcohol and is unscented. Do not apply lotion between your toes. Shoes and socks  Wear clean socks or stockings every day. Make sure they are not too tight. Do not wear knee-high stockings since they may decrease blood flow to your legs.  Wear shoes that fit properly and have enough cushioning. Always look in your shoes before you put them on to be sure there are no objects inside.  To break in new shoes, wear them for just a few hours a day. This prevents injuries on your feet. Wounds, scrapes, corns, and calluses  Check your feet daily for blisters, cuts, bruises, sores, and redness. If you cannot see the bottom of your feet, use a mirror or ask someone for help.  Do not cut corns or calluses or try to remove them with medicine.  If you  find a minor scrape, cut, or break in the skin on your feet, keep it and the skin around it clean and dry. You may clean these areas with mild soap and water. Do not clean the area with peroxide, alcohol, or iodine.  If you have a wound, scrape, corn, or callus on your foot, look at it several times a day to make sure it is healing and not infected. Check for: ? Redness, swelling, or pain. ? Fluid or blood. ? Warmth. ? Pus or a bad smell. General instructions  Do not cross your legs. This may decrease blood flow to your feet.  Do not use heating pads or hot water bottles on your feet. They may burn your skin. If you have lost feeling in your feet or legs, you may not know this is happening until it is too late.  Protect your feet from hot and cold by wearing shoes, such as at the beach or on hot pavement.  Schedule a complete foot exam at least once a year (annually) or more often if you have foot problems. If you have foot problems, report any cuts, sores, or bruises to your health care provider immediately. Contact a health care provider if:  You have a medical condition that increases your risk of infection and you have any cuts, sores, or bruises on your feet.  You have an injury that is not   healing.  You have redness on your legs or feet.  You feel burning or tingling in your legs or feet.  You have pain or cramps in your legs and feet.  Your legs or feet are numb.  Your feet always feel cold.  You have pain around a toenail. Get help right away if:  You have a wound, scrape, corn, or callus on your foot and: ? You have pain, swelling, or redness that gets worse. ? You have fluid or blood coming from the wound, scrape, corn, or callus. ? Your wound, scrape, corn, or callus feels warm to the touch. ? You have pus or a bad smell coming from the wound, scrape, corn, or callus. ? You have a fever. ? You have a red line going up your leg. Summary  Check your feet every day  for cuts, sores, red spots, swelling, and blisters.  Moisturize feet and legs daily.  Wear shoes that fit properly and have enough cushioning.  If you have foot problems, report any cuts, sores, or bruises to your health care provider immediately.  Schedule a complete foot exam at least once a year (annually) or more often if you have foot problems. This information is not intended to replace advice given to you by your health care provider. Make sure you discuss any questions you have with your health care provider. Document Released: 08/16/2000 Document Revised: 10/01/2017 Document Reviewed: 09/20/2016 Elsevier Patient Education  2020 [employer]   Onychomycosis/Fungal Toenails  WHAT IS IT? An infection that lies within the keratin of your nail plate that is caused by a fungus.  WHY ME? Fungal infections affect all ages, sexes, races, and creeds.  There may be many factors that predispose you to a fungal infection such as age, coexisting medical conditions such as diabetes, or an autoimmune disease; stress, medications, fatigue, genetics, etc.  Bottom line: fungus thrives in a warm, moist environment and your shoes offer such a location.  IS IT CONTAGIOUS? Theoretically, yes.  You do not want to share shoes, nail clippers or files with someone who has fungal toenails.  Walking around barefoot in the same room or sleeping in the same bed is unlikely to transfer the organism.  It is important to realize, however, that fungus can spread easily from one nail to the next on the same foot.  HOW DO WE TREAT THIS?  There are several ways to treat this condition.  Treatment may depend on many factors such as age, medications, pregnancy, liver and kidney conditions, etc.  It is best to ask your doctor which options are available to you.  1. No treatment.   Unlike many other medical concerns, you can live with this condition.  However for many people this can be a painful condition and may lead to  ingrown toenails or a bacterial infection.  It is recommended that you keep the nails cut short to help reduce the amount of fungal nail. 2. Topical treatment.  These range from herbal remedies to prescription strength nail lacquers.  About 40-50% effective, topicals require twice daily application for approximately 9 to 12 months or until an entirely new nail has grown out.  The most effective topicals are medical grade medications available through physicians offices. 3. Oral antifungal medications.  With an 80-90% cure rate, the most common oral medication requires 3 to 4 months of therapy and stays in your system for a year as the new nail grows out.  Oral antifungal medications do require   blood work to make sure it is a safe drug for you.  A liver function panel will be performed prior to starting the medication and after the first month of treatment.  It is important to have the blood work performed to avoid any harmful side effects.  In general, this medication safe but blood work is required. 4. Laser Therapy.  This treatment is performed by applying a specialized laser to the affected nail plate.  This therapy is noninvasive, fast, and non-painful.  It is not covered by insurance and is therefore, out of pocket.  The results have been very good with a 80-95% cure rate.  The [facility] is the only practice in the area to offer this therapy. 5. Permanent Nail Avulsion.  Removing the entire nail so that a new nail will not grow back. 

## 2019-05-12 ENCOUNTER — Other Ambulatory Visit: Payer: Self-pay | Admitting: Family Medicine

## 2019-05-12 ENCOUNTER — Encounter: Payer: Self-pay | Admitting: Internal Medicine

## 2019-05-12 ENCOUNTER — Other Ambulatory Visit: Payer: Self-pay

## 2019-05-12 ENCOUNTER — Ambulatory Visit: Payer: Medicare HMO | Admitting: Internal Medicine

## 2019-05-12 VITALS — BP 146/70 | HR 73 | Ht 64.0 in | Wt 122.0 lb

## 2019-05-12 DIAGNOSIS — E1169 Type 2 diabetes mellitus with other specified complication: Secondary | ICD-10-CM

## 2019-05-12 DIAGNOSIS — E1165 Type 2 diabetes mellitus with hyperglycemia: Secondary | ICD-10-CM | POA: Diagnosis not present

## 2019-05-12 DIAGNOSIS — E785 Hyperlipidemia, unspecified: Secondary | ICD-10-CM

## 2019-05-12 DIAGNOSIS — Z8639 Personal history of other endocrine, nutritional and metabolic disease: Secondary | ICD-10-CM

## 2019-05-12 DIAGNOSIS — E1151 Type 2 diabetes mellitus with diabetic peripheral angiopathy without gangrene: Secondary | ICD-10-CM | POA: Diagnosis not present

## 2019-05-12 DIAGNOSIS — IMO0002 Reserved for concepts with insufficient information to code with codable children: Secondary | ICD-10-CM

## 2019-05-12 DIAGNOSIS — I10 Essential (primary) hypertension: Secondary | ICD-10-CM

## 2019-05-12 LAB — TSH: TSH: 1.12 u[IU]/mL (ref 0.35–4.50)

## 2019-05-12 LAB — T3, FREE: T3, Free: 2.7 pg/mL (ref 2.3–4.2)

## 2019-05-12 LAB — T4, FREE: Free T4: 0.85 ng/dL (ref 0.60–1.60)

## 2019-05-12 NOTE — Progress Notes (Signed)
Subjective: Brandi Mccormick is seen today for follow up painful, elongated, thickened toenails 1-5 b/l feet that she cannot cut. Pain interferes with daily activities. Aggravating factor includes wearing enclosed shoe gear and relieved with periodic debridement.  Objective: 78 y.o. AAF, WD, WN in NAD. AAO x 3.  Neurovascular status unchanged: Capillary refill time immediate x 10 digits.  Dorsalis pedis 2/4 b/l.  Posterior tibial pulses 1/4 b/l.  Digital hair  present x 10 digits.  Skin temperature gradient WNL b/l.  Sensation intact 5/5 b/l with 10 gram monofilament.  Vibratory sensation intact b/l.  Dermatological Examination: Skin with normal turgor, texture and tone b/l.  No open wounds b/l.  Toenails 1-5 b/l discolored, thick, dystrophic with subungual debris and pain with palpation to nailbeds due to thickness of nails.  Musculoskeletal: Muscle strength 5/5 to all LE muscle groups.  HAV with bunion b/l.  No gross bony deformities b/l.  No pain, crepitus or joint limitation noted with ROM.   Assessment: Painful onychomycosis toenails 1-5 b/l   Plan: 1. Toenails 1-5 b/l were debrided in length and girth without iatrogenic bleeding. 2. Patient to continue soft, supportive shoe gear. 3. Patient to report any pedal injuries to medical professional immediately. 4. Follow up 3 months.  5. Patient/POA to call should there be a concern in the interim.

## 2019-05-12 NOTE — Progress Notes (Signed)
Patient ID: Brandi Mccormick, female   DOB: Jan 16, 1941, 78 y.o.   MRN: [id number]  HPI: Brandi Mccormick is a 78 y.o.-year-old female, presenting for f/u for DM2, dx 2013, non-insulin-dependent, uncontrolled, with complications (microalbuminuria) and h/o hyperthyroidism. Last visit 5 months ago.  DM2: Hemoglobin A1c levels: Lab Results  Component Value Date   HGBA1C 6.6 (H) 05/06/2019   HGBA1C 6.1 12/21/2018   HGBA1C 8.3 (H) 09/11/2018   HGBA1C 6.9 (A) 06/04/2018   HGBA1C 7.6 (A) 02/02/2018   HGBA1C 7.4 10/03/2017   HGBA1C 7.0 (H) 04/18/2017   HGBA1C 6.9 02/14/2017   HGBA1C 7.3 11/15/2016   HGBA1C 6.6 06/28/2016   HGBA1C 7.1 03/28/2016   HGBA1C 7.5 12/29/2015   HGBA1C 6.9 08/30/2015   HGBA1C 6.7 05/02/2015   HGBA1C 6.6 (H) 12/27/2014   Pt is on a regimen of: - Metformin ER 750 mg with dinner >>  JanuMet XR [phone] mg once a day >> changed to Metformin ER 500 mg daily due to declining kidney function 12/2018 - Glipizide XL 5 mg in a.m.  (not taking) We tried Tradjenta 5 mg daily in am - too expensive. She was getting diarrhea from Metformin >> now Metformin XR 750 mg daily >> tolerates this well.  Pt checks her sugars once a day: - am:  124-157 >> 130s, 140 >> 62-115, 120, 132 >> 109-132, 142 - 2h after b'fast:122 >> 114 >> n/c >> 122-138 >> 132 - before lunch:  126, 135 >> 130-140 >> 80-136, 142 >> 80-135, 152 - 2h after lunch:   93-145 >> n/c >> n/c >> 88, 101, 155 >> 106, 148 - before dinner:  70-146 >> 130-140 >> 108-131, 157 >> 64, 101-133, 148 - 2h after dinner:145-155 >> 91-157 >> n/c >> 64-151 >> 75-146 - bedtime: 109-141 >> 98-120 >> 150 >> 123, 154 >> 69, 149 - nighttime: n/c >> 161 >> n/c Lowest sugar: 70 >> 80 >> 62 >> 64 (delayed lunch); it is unclear at which level she has hypoglycemia awareness. Highest sugar: 157 >> 170 >> 157 >> 152.  Meter: OneTouch Verio.  Pt's meals are: - Breakfast: egg, coffee, juice; sometime - Lunch: No Lunch - Dinner: meat +  vegetables >> late dinner during the weekend as her son is cooking and brings her food - Snacks: 1-2 She stopped regular sodas.  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:  Lab Results  Component Value Date   BUN 33 (H) 05/06/2019   CREATININE 2.04 (H) 05/06/2019   Lab Results  Component Value Date   GFRAA 27 (L) 04/28/2019   GFRAA 43 (L) 07/27/2018   GFRAA >60 06/26/2018   GFRAA 38 (L) 06/02/2018   GFRAA 82 06/28/2016   GFRAA >90 06/25/2014   GFRAA >90 06/24/2014   GFRAA >90 10/23/2013  12/21/2018: GFR 28.9   She has microalbuminuria: Lab Results  Component Value Date   MICRALBCREAT 77.6 (H) 05/06/2019   MICRALBCREAT 422.7 (H) 04/02/2018   MICRALBCREAT 225.8 (H) 11/15/2016   MICRALBCREAT 48.8 (H) 06/28/2016   MICRALBCREAT 9.9 02/21/2014   MICRALBCREAT 8.8 11/16/2013  Previously on irbesartan, stopped due to recall.  Now on lisinopril 40.  -+ HL; last set of lipids: Lab Results  Component Value Date   CHOL 223 (H) 05/06/2019   HDL 62.70 05/06/2019   LDLCALC 135 (H) 05/06/2019   TRIG 128.0 05/06/2019   CHOLHDL 4 05/06/2019  Prev. on Simvastatin >> off in 06/2016 b/c fatigue.  She started pravastatin 80 mg daily in 12/2016 >>  switched to Crestor.  She is off Zetia.  - no numbness and tingling in her feet.  - Last eye exam 12/2017: No DR  She has a h/o hyperthyroidism, previously treated with medication, >20 years ago.  No other history available.  Pt denies: - feeling nodules in neck - hoarseness - dysphagia - choking - SOB with lying down  Her TFTs were normal in the last few years: Lab Results  Component Value Date   TSH 0.635 06/26/2018   TSH 1.11 04/02/2018   TSH 1.13 07/03/2017   TSH 1.12 06/28/2016   TSH 1.29 06/23/2015   TSH 1.44 10/18/2014   TSH 0.93 08/15/2014   TSH 0.44 02/17/2014   TSH 0.28 (L) 12/20/2013   FREET4 0.93 07/03/2017   FREET4 0.96 06/28/2016   FREET4 0.91 08/15/2014   FREET4 1.00 02/17/2014   FREET4 0.97 12/20/2013   T3FREE 3.2  07/03/2017   T3FREE 3.1 06/28/2016   T3FREE 3.2 08/15/2014   T3FREE 2.6 02/17/2014   T3FREE 2.9 12/20/2013   ROS: Constitutional: no weight gain/no weight loss, no fatigue, no subjective hyperthermia, no subjective hypothermia Eyes: no blurry vision, no xerophthalmia ENT: no sore throat,  + see HPI Cardiovascular: no CP/no SOB/no palpitations/no leg swelling Respiratory: no cough/no SOB/no wheezing Gastrointestinal: no N/no V/no D/no C/no acid reflux Musculoskeletal: no muscle aches/no joint aches Skin: no rashes, no hair loss Neurological: no tremors/no numbness/no tingling/no dizziness  I reviewed pt's medications, allergies, PMH, social hx, family hx, and changes were documented in the history of present illness. Otherwise, unchanged from my initial visit note.  Past Medical History:  Diagnosis Date  . Diabetes ([place])   . GERD (gastroesophageal reflux disease)   . Hyperlipidemia   . Hypertension   . Hyperthyroidism   . Pneumonia   . Shingles    Past Surgical History:  Procedure Laterality Date  . COLONOSCOPY    . DENTAL SURGERY     Social History   Socioeconomic History  . Marital status: Married    Spouse name: Not on file  . Number of children: Not on file  . Years of education: Not on file  . Highest education level: Not on file  Occupational History  . 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 Financial resource strain: Not on file  . Food insecurity    Worry: Not on file    Inability: Not on file  . Transportation needs    Medical: Not on file    Non-medical: Not on file  Tobacco Use  . Smoking status: Never Smoker  . Smokeless tobacco: Never Used  Substance and Sexual Activity  . Alcohol use: Yes    Alcohol/week: 0.0 standard drinks    Comment: occasional wine cooler  . Drug use: No  . Sexual activity: Never    Partners: Male  Lifestyle  . Physical activity    Days per week: Not on file    Minutes per session: Not on file  .  Stress: Not on file  Relationships  . Social Herbalist on phone: Not on file    Gets together: Not on file    Attends religious service: Not on file    Active member of club or organization: Not on file    Attends meetings of clubs or organizations: Not on file    Relationship status: Not on file  . Intimate partner violence    Fear of current or ex partner: Not on  file    Emotionally abused: Not on file    Physically abused: Not on file    Forced sexual activity: Not on file  Other Topics Concern  . Not on file  Social History Narrative   Exercise-- yard work,  Walking in [employer]   Current Outpatient Medications on File Prior to Visit  Medication Sig Dispense Refill  . amLODipine (NORVASC) 10 MG tablet Take 1 tablet by mouth once daily 90 tablet 0  . aspirin EC 81 MG tablet Take 81 mg by mouth daily.    . famotidine (PEPCID) 20 MG tablet Take 20 mg by mouth daily.    . furosemide (LASIX) 20 MG tablet Take 20 mg by mouth 2 (two) times daily as needed.    Marland Kitchen glipiZIDE (GLUCOTROL XL) 5 MG 24 hr tablet Take 1 tablet (5 mg total) by mouth daily. 90 tablet 3  . hydroxyurea (HYDREA) 500 MG capsule Take 1 capsule by mouth once daily 60 capsule 0  . lisinopril (ZESTRIL) 40 MG tablet Take 1 tablet by mouth once daily 90 tablet 1  . metFORMIN (GLUCOPHAGE-XR) 500 MG 24 hr tablet Take 1 tablet (500 mg total) by mouth daily with breakfast. 90 tablet 3  . metoprolol succinate (TOPROL-XL) 25 MG 24 hr tablet Take 1 tablet by mouth once daily 90 tablet 0  . ONETOUCH DELICA LANCETS 27N MISC OneTouch Delica Lancets 33 gauge    . ONETOUCH VERIO test strip USE 1 STRIP TO CHECK GLUCOSE ONCE DAILY 50 each 0  . rosuvastatin (CRESTOR) 40 MG tablet Take 1 tablet (40 mg total) by mouth daily. 90 tablet 1   No current facility-administered medications on file prior to visit.    Allergies  Allergen Reactions  . Penicillins Nausea And Vomiting    Has patient had a PCN reaction causing immediate  rash, facial/tongue/throat swelling, SOB or lightheadedness with hypotension: no Has patient had a PCN reaction causing severe rash involving mucus membranes or skin necrosis: no Has patient had a PCN reaction that required hospitalization: no Has patient had a PCN reaction occurring within the last 10 years: no If all of the above answers are "NO", then may proceed with Cephalosporin use.    Family History  Problem Relation Age of Onset  . Diabetes Sister   . Diabetes Brother   . Diabetes Sister   . Diabetes Mother        and on mother's side of family  . Colon cancer Mother   . Stroke Mother   . Diabetes Father        and on father's side of family  . Heart disease Neg Hx   . Rectal cancer Neg Hx   . Stomach cancer Neg Hx     PE: BP (!) 146/70   Pulse 73   Ht 5\' 4"  (1.626 m)   Wt 122 lb (55.3 kg)   SpO2 97%   BMI 20.94 kg/m  Body mass index is 20.94 kg/m. Wt Readings from Last 3 Encounters:  05/12/19 122 lb (55.3 kg)  05/06/19 122 lb (55.3 kg)  01/26/19 122 lb 9.6 oz (55.6 kg)   Constitutional: normal weight, in NAD Eyes: PERRLA, EOMI, no exophthalmos ENT: moist mucous membranes, no thyromegaly, no cervical lymphadenopathy Cardiovascular: RRR, No MRG Respiratory: CTA B Gastrointestinal: abdomen soft, NT, ND, BS+ Musculoskeletal: no deformities, strength intact in all 4 Skin: moist, warm, no rashes Neurological: no tremor with outstretched hands, DTR normal in all 4  ASSESSMENT: 1. DM2, non-insulin-dependent, uncontrolled,  with complications - MAU  2. H/o Hyperthyroidism - 20 years ago - was on meds, cannot remember name  3. HL  PLAN:  1. Patient with history of uncontrolled diabetes, with improved control in the last 4 to 5 years, only on oral diabetic regimen.  At last visit, HbA1c was excellent, at 5.7%, but the most recent HbA1c obtained by PCP several days ago was higher, at 6.6%.  Of note, at last visit, we had to stop Janumet ER due to decreased  kidney function.  I advised her to take a low-dose metformin ER, 500 mg daily. At this visit, her sugars remain out of close to goal, with few hyperglycemic spikes, but no higher than 140s before mealsUnfortunately, her GFR is now 27, slightly lower than before, so we need to also stop metformin ER.  I advised her to let me know if her sugars at home increase after stopping metformin, in which case, we will need to increase glipizide.  Adding a GLP-1 receptor agonist is another option if her sugars worsen. - we are limited in the medication choices due to her decreased kidney function.  She has FSGS and seen Dr. Johnney Ou.  She is not on prednisone but rituximab was contemplated.  She is on hydroxyurea for chronic myeloproliferative disease.  - I suggested to:  Patient Instructions  Please continue: - Glipizide XL 5 mg in a.m. and take 2.5 mg only before a large dinner  Stop: - Metformin ER   Please return in 4 months with your sugar log.   - advised to check sugars at different times of the day - 1x a day, rotating check times - advised for yearly eye exams >> she is UTD - refuses flu shot today - return to clinic in 4 months   2. H/o hyperthyroidism -Appears euthyroid -Reviewed most recent TFTs and these were normal in 06/2018 Lab Results  Component Value Date   TSH 0.635 06/26/2018  -We will repeat TFTs today  3. HL - Reviewed latest lipid panel from earlier this month: LDL above target, triglycerides and HDL at goal Lab Results  Component Value Date   CHOL 223 (H) 05/06/2019   HDL 62.70 05/06/2019   LDLCALC 135 (H) 05/06/2019   TRIG 128.0 05/06/2019   CHOLHDL 4 05/06/2019  - Continues Crestor 40 without side effects.  Office Visit on 05/12/2019  Component Date Value Ref Range Status  . TSH 05/12/2019 1.12  0.35 - 4.50 uIU/mL Final  . Free T4 05/12/2019 0.85  0.60 - 1.60 ng/dL Final   Comment: Specimens from patients who are undergoing biotin therapy and /or ingesting biotin  supplements may contain high levels of biotin.  The higher biotin concentration in these specimens interferes with this Free T4 assay.  Specimens that contain high levels  of biotin may cause false high results for this Free T4 assay.  Please interpret results in light of the total clinical presentation of the patient.    . T3, Free 05/12/2019 2.7  2.3 - 4.2 pg/mL Final   TFTs are normal.  Philemon Kingdom, MD PhD [facility] Endocrinology

## 2019-05-12 NOTE — Patient Instructions (Signed)
Please continue: - Glipizide XL 5 mg in a.m. and take 2.5 mg only before a large dinner  Stop: - Metformin ER   Please return in 4 months with your sugar log.

## 2019-05-17 ENCOUNTER — Encounter: Payer: Self-pay | Admitting: *Deleted

## 2019-05-17 ENCOUNTER — Other Ambulatory Visit: Payer: Self-pay | Admitting: Nurse Practitioner

## 2019-05-18 DIAGNOSIS — D473 Essential (hemorrhagic) thrombocythemia: Secondary | ICD-10-CM | POA: Diagnosis not present

## 2019-05-18 DIAGNOSIS — I129 Hypertensive chronic kidney disease with stage 1 through stage 4 chronic kidney disease, or unspecified chronic kidney disease: Secondary | ICD-10-CM | POA: Diagnosis not present

## 2019-05-18 DIAGNOSIS — E1129 Type 2 diabetes mellitus with other diabetic kidney complication: Secondary | ICD-10-CM | POA: Diagnosis not present

## 2019-05-18 DIAGNOSIS — E785 Hyperlipidemia, unspecified: Secondary | ICD-10-CM | POA: Diagnosis not present

## 2019-05-18 DIAGNOSIS — N051 Unspecified nephritic syndrome with focal and segmental glomerular lesions: Secondary | ICD-10-CM | POA: Diagnosis not present

## 2019-05-20 ENCOUNTER — Other Ambulatory Visit: Payer: Self-pay | Admitting: Internal Medicine

## 2019-05-20 DIAGNOSIS — Z0100 Encounter for examination of eyes and vision without abnormal findings: Secondary | ICD-10-CM | POA: Diagnosis not present

## 2019-05-20 DIAGNOSIS — H5213 Myopia, bilateral: Secondary | ICD-10-CM | POA: Diagnosis not present

## 2019-05-20 DIAGNOSIS — R69 Illness, unspecified: Secondary | ICD-10-CM | POA: Diagnosis not present

## 2019-06-08 DIAGNOSIS — H35033 Hypertensive retinopathy, bilateral: Secondary | ICD-10-CM | POA: Diagnosis not present

## 2019-06-08 DIAGNOSIS — H25813 Combined forms of age-related cataract, bilateral: Secondary | ICD-10-CM | POA: Diagnosis not present

## 2019-06-10 ENCOUNTER — Other Ambulatory Visit: Payer: Self-pay

## 2019-06-10 ENCOUNTER — Telehealth: Payer: Self-pay | Admitting: *Deleted

## 2019-06-10 ENCOUNTER — Inpatient Hospital Stay: Payer: Medicare HMO | Attending: Hematology and Oncology

## 2019-06-10 DIAGNOSIS — D45 Polycythemia vera: Secondary | ICD-10-CM | POA: Diagnosis not present

## 2019-06-10 DIAGNOSIS — Z79899 Other long term (current) drug therapy: Secondary | ICD-10-CM | POA: Insufficient documentation

## 2019-06-10 DIAGNOSIS — E119 Type 2 diabetes mellitus without complications: Secondary | ICD-10-CM | POA: Diagnosis not present

## 2019-06-10 DIAGNOSIS — D471 Chronic myeloproliferative disease: Secondary | ICD-10-CM

## 2019-06-10 DIAGNOSIS — I10 Essential (primary) hypertension: Secondary | ICD-10-CM | POA: Insufficient documentation

## 2019-06-10 LAB — BASIC METABOLIC PANEL - CANCER CENTER ONLY
Anion gap: 9 (ref 5–15)
BUN: 33 mg/dL — ABNORMAL HIGH (ref 8–23)
CO2: 21 mmol/L — ABNORMAL LOW (ref 22–32)
Calcium: 9.1 mg/dL (ref 8.9–10.3)
Chloride: 109 mmol/L (ref 98–111)
Creatinine: 2.19 mg/dL — ABNORMAL HIGH (ref 0.44–1.00)
GFR, Est AFR Am: 24 mL/min — ABNORMAL LOW (ref >60)
GFR, Estimated: 21 mL/min — ABNORMAL LOW (ref >60)
Glucose, Bld: 246 mg/dL — ABNORMAL HIGH (ref 70–99)
Potassium: 5.1 mmol/L (ref 3.5–5.1)
Sodium: 139 mmol/L (ref 135–145)

## 2019-06-10 LAB — CBC WITH DIFFERENTIAL (CANCER CENTER ONLY)
Abs Immature Granulocytes: 0.05 10*3/uL (ref 0.00–0.07)
Basophils Absolute: 0 10*3/uL (ref 0.0–0.1)
Basophils Relative: 1 %
Eosinophils Absolute: 0.1 10*3/uL (ref 0.0–0.5)
Eosinophils Relative: 1 %
HCT: 33.3 % — ABNORMAL LOW (ref 36.0–46.0)
Hemoglobin: 11.2 g/dL — ABNORMAL LOW (ref 12.0–15.0)
Immature Granulocytes: 1 %
Lymphocytes Relative: 23 %
Lymphs Abs: 1.5 10*3/uL (ref 0.7–4.0)
MCH: 36.7 pg — ABNORMAL HIGH (ref 26.0–34.0)
MCHC: 33.6 g/dL (ref 30.0–36.0)
MCV: 109.2 fL — ABNORMAL HIGH (ref 80.0–100.0)
Monocytes Absolute: 0.5 10*3/uL (ref 0.1–1.0)
Monocytes Relative: 8 %
Neutro Abs: 4.5 10*3/uL (ref 1.7–7.7)
Neutrophils Relative %: 66 %
Platelet Count: 428 10*3/uL — ABNORMAL HIGH (ref 150–400)
RBC: 3.05 MIL/uL — ABNORMAL LOW (ref 3.87–5.11)
RDW: 13.5 % (ref 11.5–15.5)
WBC Count: 6.7 10*3/uL (ref 4.0–10.5)
nRBC: 0 % (ref 0.0–0.2)

## 2019-06-10 NOTE — Telephone Encounter (Signed)
-----   Message from Ladell Pier, MD sent at 06/10/2019  1:55 PM EDT ----- Please call patient, hemoglobin and platelets remain in goal range, creatinine remains elevated copy labs to Dr. Etter Sjogren, follow-up as scheduled

## 2019-06-10 NOTE — Telephone Encounter (Signed)
Notified patient of lab results and to continue same dose of Hydrea. Creatinine still elevated and copy sent to Dr. Etter Sjogren.

## 2019-06-16 DIAGNOSIS — H25813 Combined forms of age-related cataract, bilateral: Secondary | ICD-10-CM | POA: Diagnosis not present

## 2019-06-16 DIAGNOSIS — H524 Presbyopia: Secondary | ICD-10-CM | POA: Diagnosis not present

## 2019-07-01 DIAGNOSIS — H25812 Combined forms of age-related cataract, left eye: Secondary | ICD-10-CM | POA: Diagnosis not present

## 2019-07-01 DIAGNOSIS — H2512 Age-related nuclear cataract, left eye: Secondary | ICD-10-CM | POA: Diagnosis not present

## 2019-07-06 ENCOUNTER — Other Ambulatory Visit: Payer: Self-pay | Admitting: Family Medicine

## 2019-07-06 DIAGNOSIS — R69 Illness, unspecified: Secondary | ICD-10-CM | POA: Diagnosis not present

## 2019-07-06 DIAGNOSIS — I10 Essential (primary) hypertension: Secondary | ICD-10-CM

## 2019-07-28 ENCOUNTER — Other Ambulatory Visit: Payer: Self-pay

## 2019-07-28 ENCOUNTER — Inpatient Hospital Stay: Payer: Medicare HMO | Admitting: Nurse Practitioner

## 2019-07-28 ENCOUNTER — Inpatient Hospital Stay: Payer: Medicare HMO | Attending: Hematology and Oncology

## 2019-07-28 ENCOUNTER — Encounter: Payer: Self-pay | Admitting: Nurse Practitioner

## 2019-07-28 ENCOUNTER — Telehealth: Payer: Self-pay | Admitting: Nurse Practitioner

## 2019-07-28 VITALS — BP 142/55 | HR 73 | Temp 98.2°F | Resp 16 | Wt 123.0 lb

## 2019-07-28 DIAGNOSIS — D45 Polycythemia vera: Secondary | ICD-10-CM | POA: Insufficient documentation

## 2019-07-28 DIAGNOSIS — Z79899 Other long term (current) drug therapy: Secondary | ICD-10-CM | POA: Diagnosis not present

## 2019-07-28 DIAGNOSIS — D471 Chronic myeloproliferative disease: Secondary | ICD-10-CM

## 2019-07-28 DIAGNOSIS — E119 Type 2 diabetes mellitus without complications: Secondary | ICD-10-CM | POA: Diagnosis not present

## 2019-07-28 DIAGNOSIS — I10 Essential (primary) hypertension: Secondary | ICD-10-CM | POA: Insufficient documentation

## 2019-07-28 LAB — CBC WITH DIFFERENTIAL (CANCER CENTER ONLY)
Abs Immature Granulocytes: 0.03 10*3/uL (ref 0.00–0.07)
Basophils Absolute: 0 10*3/uL (ref 0.0–0.1)
Basophils Relative: 1 %
Eosinophils Absolute: 0.1 10*3/uL (ref 0.0–0.5)
Eosinophils Relative: 1 %
HCT: 35.3 % — ABNORMAL LOW (ref 36.0–46.0)
Hemoglobin: 11.8 g/dL — ABNORMAL LOW (ref 12.0–15.0)
Immature Granulocytes: 0 %
Lymphocytes Relative: 23 %
Lymphs Abs: 1.6 10*3/uL (ref 0.7–4.0)
MCH: 35.4 pg — ABNORMAL HIGH (ref 26.0–34.0)
MCHC: 33.4 g/dL (ref 30.0–36.0)
MCV: 106 fL — ABNORMAL HIGH (ref 80.0–100.0)
Monocytes Absolute: 0.6 10*3/uL (ref 0.1–1.0)
Monocytes Relative: 8 %
Neutro Abs: 4.9 10*3/uL (ref 1.7–7.7)
Neutrophils Relative %: 67 %
Platelet Count: 485 10*3/uL — ABNORMAL HIGH (ref 150–400)
RBC: 3.33 MIL/uL — ABNORMAL LOW (ref 3.87–5.11)
RDW: 13.4 % (ref 11.5–15.5)
WBC Count: 7.2 10*3/uL (ref 4.0–10.5)
nRBC: 0 % (ref 0.0–0.2)

## 2019-07-28 NOTE — Progress Notes (Signed)
  [facility] OFFICE PROGRESS NOTE   Diagnosis: Polycythemia vera  INTERVAL HISTORY:   [name] returns as scheduled.  She continues Hydrea.  She denies nausea/vomiting.  No mouth sores.  No diarrhea.  No skin rash.  She denies bleeding.  No symptoms of thrombosis.  Objective:  Vital signs in last 24 hours:  Blood pressure (!) 142/55, pulse 73, temperature 98.2 F (36.8 C), resp. rate 16, weight 123 lb (55.8 kg), SpO2 100 %.    HEENT: No thrush or ulcers. GI: Abdomen soft and nontender.  No hepatosplenomegaly. Vascular: No leg edema.  Calves soft and nontender. Neuro: Alert and oriented. Skin: No rash.   Lab Results:  Lab Results  Component Value Date   WBC 7.2 07/28/2019   HGB 11.8 (L) 07/28/2019   HCT 35.3 (L) 07/28/2019   MCV 106.0 (H) 07/28/2019   PLT 485 (H) 07/28/2019   NEUTROABS 4.9 07/28/2019    Imaging:  No results found.  Medications: I have reviewed the patient's current medications.  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 Type 2 diabetes 3. Hypertension 4. Focal/segmental glomerular sclerosis   Disposition: [name] remains stable from a hematologic standpoint.  Hemoglobin is in goal range.  She will continue hydroxyurea at the current dose.  She will continue aspirin therapy as well.    She will return for a CBC in 3 months, lab and follow-up in 6 months.  She will contact the office in the interim with any problems.    Ned Card ANP/GNP-BC   07/28/2019  9:37 AM

## 2019-07-28 NOTE — Telephone Encounter (Signed)
Scheduled per los. Gave avs and calendar  

## 2019-08-02 ENCOUNTER — Other Ambulatory Visit: Payer: Self-pay | Admitting: Internal Medicine

## 2019-08-02 ENCOUNTER — Other Ambulatory Visit: Payer: Self-pay | Admitting: Family Medicine

## 2019-08-02 DIAGNOSIS — R69 Illness, unspecified: Secondary | ICD-10-CM | POA: Diagnosis not present

## 2019-08-13 ENCOUNTER — Ambulatory Visit: Payer: Medicare HMO | Admitting: Podiatry

## 2019-08-13 ENCOUNTER — Encounter: Payer: Self-pay | Admitting: Podiatry

## 2019-08-13 ENCOUNTER — Other Ambulatory Visit: Payer: Self-pay

## 2019-08-13 DIAGNOSIS — M79675 Pain in left toe(s): Secondary | ICD-10-CM

## 2019-08-13 DIAGNOSIS — M722 Plantar fascial fibromatosis: Secondary | ICD-10-CM

## 2019-08-13 DIAGNOSIS — B351 Tinea unguium: Secondary | ICD-10-CM | POA: Diagnosis not present

## 2019-08-13 DIAGNOSIS — M79674 Pain in right toe(s): Secondary | ICD-10-CM

## 2019-08-13 NOTE — Patient Instructions (Signed)
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  Onychomycosis/Fungal Toenails  WHAT IS IT? An infection that lies within the keratin of your nail plate that is caused by a fungus.  WHY ME? Fungal infections affect all ages, sexes, races, and creeds.  There may be many factors that predispose you to a fungal infection such as age, coexisting medical conditions such as diabetes, or an autoimmune disease; stress, medications, fatigue, genetics, etc.  Bottom line: fungus thrives in a warm, moist environment and your shoes offer such a location.  IS IT CONTAGIOUS? Theoretically, yes.  You do not want to share shoes, nail clippers or files with someone who has fungal toenails.  Walking around barefoot in the same room or sleeping in the same bed is unlikely to transfer the organism.  It is important to realize, however, that fungus can spread easily from one nail to the next on the same foot.  HOW DO WE TREAT THIS?  There are several ways to treat this condition.  Treatment may depend on many factors such as age, medications, pregnancy, liver and kidney conditions, etc.  It is best to ask your doctor which options are available to you.  1. No treatment.   Unlike many other medical concerns, you can live with this condition.  However for many people this can be a painful condition and may lead to ingrown toenails or a bacterial infection.  It is recommended that you keep the nails cut short to help reduce the amount of fungal nail. 2. Topical treatment.  These range from herbal remedies to prescription strength nail lacquers.  About 40-50% effective, topicals require twice daily application for approximately 9 to 12 months or until an entirely new nail has grown out.  The most effective topicals are medical grade medications available through physicians offices. 3. Oral antifungal medications.  With an 80-90% cure rate, the most common oral medication requires 3 to 4 months of therapy and stays in your system  for a year as the new nail grows out.  Oral antifungal medications do require blood work to make sure it is a safe drug for you.  A liver function panel will be performed prior to starting the medication and after the first month of treatment.  It is important to have the blood work performed to avoid any harmful side effects.  In general, this medication safe but blood work is required. 4. Laser Therapy.  This treatment is performed by applying a specialized laser to the affected nail plate.  This therapy is noninvasive, fast, and non-painful.  It is not covered by insurance and is therefore, out of pocket.  The results have been very good with a 80-95% cure rate.  The [facility] is the only practice in the area to offer this therapy. 5. Permanent Nail Avulsion.  Removing the entire nail so that a new nail will not grow back.

## 2019-08-18 ENCOUNTER — Other Ambulatory Visit: Payer: Self-pay | Admitting: Nurse Practitioner

## 2019-08-19 NOTE — Progress Notes (Signed)
Subjective: Brandi Mccormick is seen today for follow up painful, elongated, thickened toenails bilateral feet that she cannot cut. Pain interferes with daily activities. Aggravating factor includes wearing enclosed shoe gear and relieved with periodic debridement.  Medications reviewed in chart.  Allergies  Allergen Reactions  . Penicillins Nausea And Vomiting    Has patient had a PCN reaction causing immediate rash, facial/tongue/throat swelling, SOB or lightheadedness with hypotension: no Has patient had a PCN reaction causing severe rash involving mucus membranes or skin necrosis: no Has patient had a PCN reaction that required hospitalization: no Has patient had a PCN reaction occurring within the last 10 years: no If all of the above answers are "NO", then may proceed with Cephalosporin use.      Objective:  Vascular Examination: Capillary refill time immediate b/l.  Dorsalis pedis present b/l.  Posterior tibial pulses faintly palpable b/l.  Digital hair present b/l.  Skin temperature gradient WNL b/l.   Dermatological Examination: Skin with normal turgor, texture and tone b/l.  Toenails 1-5 b/l discolored, thick, dystrophic with subungual debris and pain with palpation to nailbeds due to thickness of nails.  Musculoskeletal: Muscle strength 5/5 to all LE muscle groups b/l.  HAV with bunion b/l   Two palpable fixed nodules medial band of plantar fascia left foot. No tenderness to palpation. No edema, no erythema.  No pain, crepitus or joint limitation noted with ROM.   Neurological Examination: Protective sensation intact with 10 gram monofilament bilaterally.  Epicritic sensation present bilaterally.  Vibratory sensation intact bilaterally.   Assessment: Painful onychomycosis toenails 1-5 b/l  Plantar fibromatosis left foot  Plan: 1. Toenails 1-5 b/l were debrided in length and girth without iatrogenic bleeding. 2. Discussed plantar fibromatosis etiology  and treatment options. She is asymptomatic today. Recommended change in shoe gear to [employer] with memory foam insoles to absorb shock when ambulating. 3. Patient to continue soft, supportive shoe gear daily. 4. Patient to report any pedal injuries to medical professional immediately. 5. Follow up 3 months.  6. Patient/POA to call should there be a concern in the interim.

## 2019-09-09 NOTE — Progress Notes (Signed)
Patient ID: Brandi Mccormick, female   DOB: May 27, 1941, 79 y.o.   MRN: [id number]  Patient location: Home My location: Office Persons participating in the virtual visit: patient, provider  Referring Provider: [name], DO  I connected with the patient on 09/09/19 at  8:58 AM EST by telephone and verified that I am speaking with the correct person.   I discussed the limitations of evaluation and management by telephone and the availability of in person appointments. The patient expressed understanding and agreed to proceed.   Details of the encounter are shown below.  HPI: Brandi Mccormick is a 79 y.o.-year-old female, presenting for f/u for DM2, dx 2013, non-insulin-dependent, uncontrolled, with complications (microalbuminuria) and h/o hyperthyroidism. Last visit 4 months ago.  DM2: Reviewed HbA1c levels: Lab Results  Component Value Date   HGBA1C 6.6 (H) 05/06/2019   HGBA1C 6.1 12/21/2018   HGBA1C 8.3 (H) 09/11/2018   HGBA1C 6.9 (A) 06/04/2018   HGBA1C 7.6 (A) 02/02/2018   HGBA1C 7.4 10/03/2017   HGBA1C 7.0 (H) 04/18/2017   HGBA1C 6.9 02/14/2017   HGBA1C 7.3 11/15/2016   HGBA1C 6.6 06/28/2016   HGBA1C 7.1 03/28/2016   HGBA1C 7.5 12/29/2015   HGBA1C 6.9 08/30/2015   HGBA1C 6.7 05/02/2015   HGBA1C 6.6 (H) 12/27/2014   Pt is on a regimen of: - Glipizide XL 5 mg in a.m.  >> not taking the eve dose We tried Tradjenta 5 mg daily in am - too expensive. She was previously on Janumet XR and Metformin ER, but this was stopped 05/2019 due to declining kidney function.  Pt checks her sugars once a day: - am: 130s, 140 >> 62-115, 120, 132 >> 109-132, 142 >> 136-168 - 2h after b'fast:122 >> 114 >> n/c >> 122-138 >> 132 >> n/c - before lunch: 130-140 >> 80-136, 142 >> 80-135, 152 >> 101, 205 - 2h after lunch: 88, 101, 155 >> 106, 148 >> 101, 142, 145, 207, 209 - before dinner: 108-131, 157 >> 64, 101-133, 148 >> 131, 138, 238, 239 - 2h after dinner: 91-157 >> n/c >>  64-151 >> 75-146 >> 117 - bedtime:  98-120 >> 150 >> 123, 154 >> 69, 149 >> 137-169 - nighttime: n/c >> 161 >> n/c Lowest sugar: 64 (delayed lunch) >> 101; it is unclear at which ever she has hypoglycemia awareness Highest sugar: 152 >> 239.  Meter: OneTouch Verio.  Pt's meals are: - Breakfast: egg, coffee, juice; sometime - Lunch: No Lunch - Dinner: meat + vegetables >> late dinner during the weekend as her son is cooking and brings her food - Snacks: 1-2 She stopped regular sodas.  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:  Lab Results  Component Value Date   BUN 33 (H) 06/10/2019   CREATININE 2.19 (H) 06/10/2019   Lab Results  Component Value Date   GFRAA 24 (L) 06/10/2019   GFRAA 27 (L) 04/28/2019   GFRAA 43 (L) 07/27/2018   GFRAA >60 06/26/2018   GFRAA 38 (L) 06/02/2018   GFRAA 82 06/28/2016   GFRAA >90 06/25/2014   GFRAA >90 06/24/2014   GFRAA >90 10/23/2013  12/21/2018: GFR 28.9   She has microalbuminuria: Lab Results  Component Value Date   MICRALBCREAT 77.6 (H) 05/06/2019   MICRALBCREAT 422.7 (H) 04/02/2018   MICRALBCREAT 225.8 (H) 11/15/2016   MICRALBCREAT 48.8 (H) 06/28/2016   MICRALBCREAT 9.9 02/21/2014   MICRALBCREAT 8.8 11/16/2013  Now on lisinopril 40.  -+ HL; last set of lipids: Lab Results  Component Value Date   CHOL 223 (H) 05/06/2019   HDL 62.70 05/06/2019   LDLCALC 135 (H) 05/06/2019   TRIG 128.0 05/06/2019   CHOLHDL 4 05/06/2019  Prev. on Simvastatin >> off in 06/2016 b/c fatigue.  She started pravastatin 80 mg daily in 12/2016 >> currently on Crestor.  She is off Zetia.  - no numbness and tingling in her feet.  - Last eye exam 12/2017: No DR  She has a h/o hyperthyroidism, previously treated with medication, more than 20 years ago.  No other history available.  Pt denies: - feeling nodules in neck - hoarseness - dysphagia - choking - SOB with lying down  Her TFTs were normal in the last 3 years: Lab Results  Component Value Date    TSH 1.12 05/12/2019   TSH 0.635 06/26/2018   TSH 1.11 04/02/2018   TSH 1.13 07/03/2017   TSH 1.12 06/28/2016   TSH 1.29 06/23/2015   TSH 1.44 10/18/2014   TSH 0.93 08/15/2014   TSH 0.44 02/17/2014   TSH 0.28 (L) 12/20/2013   FREET4 0.85 05/12/2019   FREET4 0.93 07/03/2017   FREET4 0.96 06/28/2016   FREET4 0.91 08/15/2014   FREET4 1.00 02/17/2014   FREET4 0.97 12/20/2013   T3FREE 2.7 05/12/2019   T3FREE 3.2 07/03/2017   T3FREE 3.1 06/28/2016   T3FREE 3.2 08/15/2014   T3FREE 2.6 02/17/2014   T3FREE 2.9 12/20/2013   ROS: Constitutional: no weight gain/no weight loss, no fatigue, no subjective hyperthermia, no subjective hypothermia Eyes: no blurry vision, no xerophthalmia ENT: no sore throat, + see HPI Cardiovascular: no CP/no SOB/no palpitations/no leg swelling Respiratory: no cough/no SOB/no wheezing Gastrointestinal: no N/no V/no D/no C/no acid reflux Musculoskeletal: no muscle aches/no joint aches Skin: no rashes, no hair loss Neurological: no tremors/no numbness/no tingling/no dizziness  I reviewed pt's medications, allergies, PMH, social hx, family hx, and changes were documented in the history of present illness. Otherwise, unchanged from my initial visit note.  Past Medical History:  Diagnosis Date  . Diabetes ([place])   . GERD (gastroesophageal reflux disease)   . Hyperlipidemia   . Hypertension   . Hyperthyroidism   . Pneumonia   . Shingles    Past Surgical History:  Procedure Laterality Date  . COLONOSCOPY    . DENTAL SURGERY     Social History   Socioeconomic History  . Marital status: Married    Spouse name: Not on file  . Number of children: Not on file  . Years of education: Not on file  . Highest education level: Not on file  Occupational History  . Occupation: [employer]--    Comment: retired  Tobacco Use  . Smoking status: Never Smoker  . Smokeless tobacco: Never Used  Substance and Sexual Activity  . Alcohol use: Yes    Alcohol/week: 0.0  standard drinks    Comment: occasional wine cooler  . Drug use: No  . Sexual activity: Never    Partners: Male  Other Topics Concern  . Not on file  Social History Narrative   Exercise-- yard work,  Walking in [place] Strain:   . Difficulty of Paying Living Expenses: Not on file  Food Insecurity:   . Worried About Charity fundraiser in the Last Year: Not on file  . Ran Out of Food in the Last Year: Not on file  Transportation Needs:   . Lack of Transportation (Medical): Not on file  .  Lack of Transportation (Non-Medical): Not on file  Physical Activity:   . Days of Exercise per Week: Not on file  . Minutes of Exercise per Session: Not on file  Stress:   . Feeling of Stress : Not on file  Social Connections:   . Frequency of Communication with Friends and Family: Not on file  . Frequency of Social Gatherings with Friends and Family: Not on file  . Attends Religious Services: Not on file  . Active Member of Clubs or Organizations: Not on file  . Attends Archivist Meetings: Not on file  . Marital Status: Not on file  Intimate Partner Violence:   . Fear of Current or Ex-Partner: Not on file  . Emotionally Abused: Not on file  . Physically Abused: Not on file  . Sexually Abused: Not on file   Current Outpatient Medications on File Prior to Visit  Medication Sig Dispense Refill  . amLODipine (NORVASC) 10 MG tablet Take 1 tablet by mouth once daily 90 tablet 1  . aspirin EC 81 MG tablet Take 81 mg by mouth daily.    . DUREZOL 0.05 % EMUL SMARTSIG:1 Drop(s) Left Eye As Directed    . famotidine (PEPCID) 20 MG tablet Take 20 mg by mouth daily.    . furosemide (LASIX) 20 MG tablet Take 20 mg by mouth 2 (two) times daily as needed.    Marland Kitchen glipiZIDE (GLUCOTROL XL) 5 MG 24 hr tablet Take 1 tablet (5 mg total) by mouth daily. 90 tablet 3  . hydroxyurea (HYDREA) 500 MG capsule Take 1 capsule by mouth once daily 60 capsule 0   . Lancets (ONETOUCH DELICA PLUS [id number]) MISC USE   TO CHECK GLUCOSE TWICE DAILY 100 each 11  . lisinopril (ZESTRIL) 40 MG tablet Take 1 tablet by mouth once daily 90 tablet 1  . metoprolol succinate (TOPROL-XL) 25 MG 24 hr tablet Take 1 tablet by mouth once daily 90 tablet 1  . moxifloxacin (VIGAMOX) 0.5 % ophthalmic solution INSTILL 1 DROP 4 TIMES DAILY STARTING 2 DAYS PRIOR TO SURGERY AND 2 DROPS MORNING OF SURGERY INTO LEFT EYE.    Glory Rosebush VERIO test strip USE 1 STRIP TO CHECK GLUCOSE ONCE DAILY 50 each 4  . rosuvastatin (CRESTOR) 40 MG tablet Take 1 tablet (40 mg total) by mouth daily. 90 tablet 1   No current facility-administered medications on file prior to visit.   Allergies  Allergen Reactions  . Penicillins Nausea And Vomiting    Has patient had a PCN reaction causing immediate rash, facial/tongue/throat swelling, SOB or lightheadedness with hypotension: no Has patient had a PCN reaction causing severe rash involving mucus membranes or skin necrosis: no Has patient had a PCN reaction that required hospitalization: no Has patient had a PCN reaction occurring within the last 10 years: no If all of the above answers are "NO", then may proceed with Cephalosporin use.    Family History  Problem Relation Age of Onset  . Diabetes Sister   . Diabetes Brother   . Diabetes Sister   . Diabetes Mother        and on mother's side of family  . Colon cancer Mother   . Stroke Mother   . Diabetes Father        and on father's side of family  . Heart disease Neg Hx   . Rectal cancer Neg Hx   . Stomach cancer Neg Hx     PE: There were no vitals taken  for this visit. There is no height or weight on file to calculate BMI. Wt Readings from Last 3 Encounters:  07/28/19 123 lb (55.8 kg)  05/12/19 122 lb (55.3 kg)  05/06/19 122 lb (55.3 kg)   Constitutional:  in NAD  The physical exam was not performed (telephone visit).  ASSESSMENT: 1. DM2, non-insulin-dependent,  uncontrolled, with complications - MAU  2. H/o Hyperthyroidism - 20 years ago - was on meds, cannot remember name  3. HL  PLAN:  1. Patient with history of uncontrolled diabetes, with improved control in the last 45 years, only on oral antidiabetic regimen.  HbA1c was excellent, at 5.7% in the past, however, at that time, she was taking Janumet ER.  This had to be switched to Metformin ER low-dose due to declining kidney function.  At last visit, we also had to stop the Metformin ER since her GFR was only 27.  We continued only with glipizide and I advised her to add a low-dose sulfonylurea before dinner.  We are limited in the medication choices due to her decreased kidney function.  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 Johnney Ou.  She is not on prednisone.  She is also on hydroxyurea for chronic myeloproliferative disease. - at this visit, sugars are worse compared to last visit at all times of the day, but they are more variable.  High blood sugars in the 200s alternate with sugars at goal in the 100s - she refuses injectables -Due to her poor kidney function, the only option that we have right now is to increase glipizide.  I advised him to let me know about her blood sugars in 2 weeks.  At that time, if her sugars are not better, we may absolutely need to start either a GLP-1 receptor agonist, if covered, or low/intermediate acting insulin - I suggested to:  Patient Instructions  Please increase: - Glipizide XL to 10 mg before breakfast   Send me a message with your sugars in 2 weeks.  Please return in 3 months with your sugar log.   - we we will recheck her HbA1c when she returns to the clinic - advised to check sugars at different times of the day - 1x a day, rotating check times - advised for yearly eye exams >> she is UTD - return to clinic in 4 months  2. H/o hyperthyroidism -No thyrotoxic complaints -Reviewed her most recent TFTs and this was normal 4 months ago: Lab Results  Component  Value Date   TSH 1.12 05/12/2019   3. HL -Reviewed latest lipid panel from 05/2019: LDL above goal, triglycerides and HDL at goal Lab Results  Component Value Date   CHOL 223 (H) 05/06/2019   HDL 62.70 05/06/2019   LDLCALC 135 (H) 05/06/2019   TRIG 128.0 05/06/2019   CHOLHDL 4 05/06/2019  -Continues high-dose Crestor without side effects.  - time spent with the patient: 13 minutes, of which >50% was spent in obtaining information about her symptoms, reviewing her previous labs, evaluations, and treatments, counseling her about her conditions (please see the discussed topics above), and developing a plan to further investigate and treat them.  Philemon Kingdom, MD PhD [facility] Endocrinology

## 2019-09-09 NOTE — Patient Instructions (Addendum)
Please increase: - Glipizide XL to 10 mg before breakfast   Send me a message with your sugars in 2 weeks.  Please return in 3 months with your sugar log.

## 2019-09-10 ENCOUNTER — Ambulatory Visit (INDEPENDENT_AMBULATORY_CARE_PROVIDER_SITE_OTHER): Payer: Medicare HMO | Admitting: Internal Medicine

## 2019-09-10 ENCOUNTER — Encounter: Payer: Self-pay | Admitting: Internal Medicine

## 2019-09-10 ENCOUNTER — Other Ambulatory Visit: Payer: Self-pay

## 2019-09-10 DIAGNOSIS — E1165 Type 2 diabetes mellitus with hyperglycemia: Secondary | ICD-10-CM

## 2019-09-10 DIAGNOSIS — E1151 Type 2 diabetes mellitus with diabetic peripheral angiopathy without gangrene: Secondary | ICD-10-CM

## 2019-09-10 DIAGNOSIS — E785 Hyperlipidemia, unspecified: Secondary | ICD-10-CM | POA: Diagnosis not present

## 2019-09-10 DIAGNOSIS — E1169 Type 2 diabetes mellitus with other specified complication: Secondary | ICD-10-CM | POA: Diagnosis not present

## 2019-09-10 DIAGNOSIS — Z8639 Personal history of other endocrine, nutritional and metabolic disease: Secondary | ICD-10-CM

## 2019-09-10 DIAGNOSIS — IMO0002 Reserved for concepts with insufficient information to code with codable children: Secondary | ICD-10-CM

## 2019-09-10 MED ORDER — GLIPIZIDE ER 5 MG PO TB24: 10.0000 mg | ORAL_TABLET | Freq: Every day | ORAL | 3 refills | Status: DC

## 2019-09-13 DIAGNOSIS — R69 Illness, unspecified: Secondary | ICD-10-CM | POA: Diagnosis not present

## 2019-09-15 DIAGNOSIS — N051 Unspecified nephritic syndrome with focal and segmental glomerular lesions: Secondary | ICD-10-CM | POA: Diagnosis not present

## 2019-09-15 DIAGNOSIS — D473 Essential (hemorrhagic) thrombocythemia: Secondary | ICD-10-CM | POA: Diagnosis not present

## 2019-09-15 DIAGNOSIS — E785 Hyperlipidemia, unspecified: Secondary | ICD-10-CM | POA: Diagnosis not present

## 2019-09-15 DIAGNOSIS — N184 Chronic kidney disease, stage 4 (severe): Secondary | ICD-10-CM | POA: Diagnosis not present

## 2019-09-15 DIAGNOSIS — E1122 Type 2 diabetes mellitus with diabetic chronic kidney disease: Secondary | ICD-10-CM | POA: Diagnosis not present

## 2019-09-15 DIAGNOSIS — I129 Hypertensive chronic kidney disease with stage 1 through stage 4 chronic kidney disease, or unspecified chronic kidney disease: Secondary | ICD-10-CM | POA: Diagnosis not present

## 2019-10-17 ENCOUNTER — Other Ambulatory Visit: Payer: Self-pay | Admitting: Nurse Practitioner

## 2019-10-24 DIAGNOSIS — R69 Illness, unspecified: Secondary | ICD-10-CM | POA: Diagnosis not present

## 2019-10-28 ENCOUNTER — Inpatient Hospital Stay: Payer: Medicare HMO | Attending: Oncology

## 2019-10-28 ENCOUNTER — Other Ambulatory Visit: Payer: Self-pay

## 2019-10-28 DIAGNOSIS — D471 Chronic myeloproliferative disease: Secondary | ICD-10-CM | POA: Diagnosis not present

## 2019-10-28 LAB — CBC WITH DIFFERENTIAL (CANCER CENTER ONLY)
Abs Immature Granulocytes: 0.02 10*3/uL (ref 0.00–0.07)
Basophils Absolute: 0 10*3/uL (ref 0.0–0.1)
Basophils Relative: 1 %
Eosinophils Absolute: 0.1 10*3/uL (ref 0.0–0.5)
Eosinophils Relative: 1 %
HCT: 34.7 % — ABNORMAL LOW (ref 36.0–46.0)
Hemoglobin: 11.6 g/dL — ABNORMAL LOW (ref 12.0–15.0)
Immature Granulocytes: 0 %
Lymphocytes Relative: 29 %
Lymphs Abs: 1.9 10*3/uL (ref 0.7–4.0)
MCH: 35.2 pg — ABNORMAL HIGH (ref 26.0–34.0)
MCHC: 33.4 g/dL (ref 30.0–36.0)
MCV: 105.2 fL — ABNORMAL HIGH (ref 80.0–100.0)
Monocytes Absolute: 0.6 10*3/uL (ref 0.1–1.0)
Monocytes Relative: 10 %
Neutro Abs: 4 10*3/uL (ref 1.7–7.7)
Neutrophils Relative %: 59 %
Platelet Count: 437 10*3/uL — ABNORMAL HIGH (ref 150–400)
RBC: 3.3 MIL/uL — ABNORMAL LOW (ref 3.87–5.11)
RDW: 14.7 % (ref 11.5–15.5)
WBC Count: 6.7 10*3/uL (ref 4.0–10.5)
nRBC: 0 % (ref 0.0–0.2)

## 2019-10-29 ENCOUNTER — Telehealth: Payer: Self-pay | Admitting: *Deleted

## 2019-10-29 NOTE — Telephone Encounter (Signed)
-----   Message from Owens Shark, NP sent at 10/29/2019  3:35 PM EST ----- Please let her know hemoglobin and platelet count both in goal range.  Continue current dose of Hydrea.  Follow-up as scheduled.

## 2019-10-29 NOTE — Telephone Encounter (Signed)
Notified of message below. Verbalized understanding 

## 2019-10-31 ENCOUNTER — Other Ambulatory Visit: Payer: Self-pay

## 2019-10-31 ENCOUNTER — Ambulatory Visit: Payer: Medicare HMO | Attending: Internal Medicine

## 2019-10-31 ENCOUNTER — Other Ambulatory Visit: Payer: Self-pay | Admitting: Family Medicine

## 2019-10-31 DIAGNOSIS — E785 Hyperlipidemia, unspecified: Secondary | ICD-10-CM

## 2019-10-31 DIAGNOSIS — Z23 Encounter for immunization: Secondary | ICD-10-CM | POA: Insufficient documentation

## 2019-10-31 NOTE — Progress Notes (Signed)
   Covid-19 Vaccination Clinic  Name:  Brandi Mccormick    MRN: [id number] DOB: 23-Mar-1941  10/31/2019  [name] was observed post Covid-19 immunization for 15 minutes without incidence. She was provided with Vaccine Information Sheet and instruction to access the V-Safe system.   [name] was instructed to call 911 with any severe reactions post vaccine: Marland Kitchen Difficulty breathing  . Swelling of your face and throat  . A fast heartbeat  . A bad rash all over your body  . Dizziness and weakness    Immunizations Administered    Name Date Dose VIS Date Route   Pfizer COVID-19 Vaccine 10/31/2019  9:31 AM 0.3 mL 08/13/2019 Intramuscular   Manufacturer: [place]   Lot: [id number]   [place]: [address]-1

## 2019-11-02 ENCOUNTER — Other Ambulatory Visit: Payer: Self-pay | Admitting: *Deleted

## 2019-11-03 ENCOUNTER — Telehealth: Payer: Self-pay | Admitting: Family Medicine

## 2019-11-03 NOTE — Progress Notes (Signed)
°  Chronic Care Management   Outreach Note  11/03/2019 Name: JANETE QUILLING MRN: [id number] DOB: 29-Aug-1941  Referred by: [name], DO Reason for referral : No chief complaint on file.   An unsuccessful telephone outreach was attempted today. The patient was referred to the pharmacist for assistance with care management and care coordination.   Follow Up Plan:   Raynicia Dukes [employer] Scheduler

## 2019-11-04 ENCOUNTER — Other Ambulatory Visit: Payer: Self-pay

## 2019-11-04 ENCOUNTER — Encounter: Payer: Self-pay | Admitting: Family Medicine

## 2019-11-04 ENCOUNTER — Ambulatory Visit (INDEPENDENT_AMBULATORY_CARE_PROVIDER_SITE_OTHER): Payer: Medicare HMO | Admitting: Family Medicine

## 2019-11-04 VITALS — BP 120/60 | HR 78 | Temp 97.3°F | Resp 18 | Ht 64.0 in | Wt 126.8 lb

## 2019-11-04 DIAGNOSIS — E785 Hyperlipidemia, unspecified: Secondary | ICD-10-CM

## 2019-11-04 DIAGNOSIS — E1165 Type 2 diabetes mellitus with hyperglycemia: Secondary | ICD-10-CM

## 2019-11-04 DIAGNOSIS — I10 Essential (primary) hypertension: Secondary | ICD-10-CM

## 2019-11-04 DIAGNOSIS — E1169 Type 2 diabetes mellitus with other specified complication: Secondary | ICD-10-CM

## 2019-11-04 LAB — MICROALBUMIN / CREATININE URINE RATIO
Creatinine,U: 105.4 mg/dL
Microalb Creat Ratio: 43.8 mg/g — ABNORMAL HIGH (ref 0.0–30.0)
Microalb, Ur: 46.2 mg/dL — ABNORMAL HIGH (ref 0.0–1.9)

## 2019-11-04 LAB — LIPID PANEL
Cholesterol: 146 mg/dL (ref 0–200)
HDL: 53.9 mg/dL (ref >39.00)
LDL Cholesterol: 78 mg/dL (ref 0–99)
NonHDL: 92.36
Total CHOL/HDL Ratio: 3
Triglycerides: 72 mg/dL (ref 0.0–149.0)
VLDL: 14.4 mg/dL (ref 0.0–40.0)

## 2019-11-04 LAB — COMPREHENSIVE METABOLIC PANEL
ALT: 16 U/L (ref 0–35)
AST: 14 U/L (ref 0–37)
Albumin: 3.6 g/dL (ref 3.5–5.2)
Alkaline Phosphatase: 56 U/L (ref 39–117)
BUN: 35 mg/dL — ABNORMAL HIGH (ref 6–23)
CO2: 27 mEq/L (ref 19–32)
Calcium: 9.6 mg/dL (ref 8.4–10.5)
Chloride: 108 mEq/L (ref 96–112)
Creatinine, Ser: 1.95 mg/dL — ABNORMAL HIGH (ref 0.40–1.20)
GFR: 30.01 mL/min — ABNORMAL LOW (ref >60.00)
Glucose, Bld: 250 mg/dL — ABNORMAL HIGH (ref 70–99)
Potassium: 4.8 mEq/L (ref 3.5–5.1)
Sodium: 141 mEq/L (ref 135–145)
Total Bilirubin: 0.4 mg/dL (ref 0.2–1.2)
Total Protein: 6.2 g/dL (ref 6.0–8.3)

## 2019-11-04 LAB — HEMOGLOBIN A1C: Hgb A1c MFr Bld: 7.2 % — ABNORMAL HIGH (ref 4.6–6.5)

## 2019-11-04 NOTE — Assessment & Plan Note (Signed)
Tolerating statin, encouraged heart healthy diet, avoid trans fats, minimize simple carbs and saturated fats. Increase exercise as tolerated 

## 2019-11-04 NOTE — Assessment & Plan Note (Signed)
Per endo °

## 2019-11-04 NOTE — Patient Instructions (Signed)

## 2019-11-04 NOTE — Assessment & Plan Note (Signed)
Well controlled, no changes to meds. Encouraged heart healthy diet such as the DASH diet and exercise as tolerated.  °

## 2019-11-04 NOTE — Progress Notes (Signed)
Patient ID: Brandi Mccormick, female    DOB: 12-02-1940  Age: 79 y.o. MRN: [id number]    Subjective:  Subjective  HPI Brandi Mccormick presents for f/u bp and chol and will see endo for dm.  No other complaints    HYPERTENSION   Blood pressure range-not checking   Chest pain- no      Dyspnea- no Lightheadedness- no   Edema- no  Other side effects - no   Medication compliance: good Low salt diet- yes    DIABETES    Blood Sugar ranges-118-150  Polyuria- no New Visual problems- no  Hypoglycemic symptoms- no  Other side effects-no Medication compliance - good Last eye exam- app this summer  Foot exam- endo and podiatry    HYPERLIPIDEMIA  Medication compliance- good RUQ pain- no  Muscle aches- no Other side effects-no      Review of Systems  Constitutional: Negative for appetite change, chills, diaphoresis, fatigue, fever and unexpected weight change.  HENT: Negative for congestion and hearing loss.   Eyes: Negative for pain, discharge, redness and visual disturbance.  Respiratory: Negative for cough, chest tightness, shortness of breath and wheezing.   Cardiovascular: Negative for chest pain, palpitations and leg swelling.  Gastrointestinal: Negative for abdominal pain, blood in stool, constipation, diarrhea, nausea and vomiting.  Endocrine: Negative for cold intolerance, heat intolerance, polydipsia, polyphagia and polyuria.  Genitourinary: Negative for difficulty urinating, dysuria, frequency, hematuria and urgency.  Musculoskeletal: Negative for back pain and myalgias.  Skin: Negative for rash.  Allergic/Immunologic: Negative for environmental allergies.  Neurological: Negative for dizziness, weakness, light-headedness, numbness and headaches.  Hematological: Does not bruise/bleed easily.  Psychiatric/Behavioral: Negative for suicidal ideas. The patient is not nervous/anxious.     History Past Medical History:  Diagnosis Date  . Diabetes ([place])   . GERD  (gastroesophageal reflux disease)   . Hyperlipidemia   . Hypertension   . Hyperthyroidism   . Pneumonia   . Shingles     She has a past surgical history that includes Dental surgery; Colonoscopy; and Cataract extraction (Left, 2020).   Her family history includes Colon cancer in her mother; Diabetes in her brother, father, mother, sister, and sister; Stroke in her mother.She reports that she has never smoked. She has never used smokeless tobacco. She reports current alcohol use. She reports that she does not use drugs.  Current Outpatient Medications on File Prior to Visit  Medication Sig Dispense Refill  . amLODipine (NORVASC) 10 MG tablet Take 1 tablet by mouth once daily 90 tablet 1  . aspirin EC 81 MG tablet Take 81 mg by mouth daily.    . DUREZOL 0.05 % EMUL SMARTSIG:1 Drop(s) Left Eye As Directed    . famotidine (PEPCID) 20 MG tablet Take 20 mg by mouth daily.    . furosemide (LASIX) 20 MG tablet Take 20 mg by mouth 2 (two) times daily as needed.    Marland Kitchen glipiZIDE (GLUCOTROL XL) 5 MG 24 hr tablet Take 2 tablets (10 mg total) by mouth daily. 180 tablet 3  . hydroxyurea (HYDREA) 500 MG capsule Take 1 capsule by mouth once daily 60 capsule 0  . Lancets (ONETOUCH DELICA PLUS [id number]) MISC USE   TO CHECK GLUCOSE TWICE DAILY 100 each 11  . lisinopril (ZESTRIL) 40 MG tablet Take 1 tablet by mouth once daily 90 tablet 1  . metoprolol succinate (TOPROL-XL) 25 MG 24 hr tablet Take 1 tablet by mouth once daily 90 tablet 1  . moxifloxacin (VIGAMOX) 0.5 %  ophthalmic solution INSTILL 1 DROP 4 TIMES DAILY STARTING 2 DAYS PRIOR TO SURGERY AND 2 DROPS MORNING OF SURGERY INTO LEFT EYE.    Brandi Mccormick VERIO test strip USE 1 STRIP TO CHECK GLUCOSE ONCE DAILY 50 each 4  . rosuvastatin (CRESTOR) 40 MG tablet Take 1 tablet by mouth once daily 90 tablet 1   No current facility-administered medications on file prior to visit.     Objective:  Objective  Physical Exam Vitals and nursing note reviewed.   Constitutional:      Appearance: She is well-developed.  HENT:     Head: Normocephalic and atraumatic.  Eyes:     Conjunctiva/sclera: Conjunctivae normal.  Neck:     Thyroid: No thyromegaly.     Vascular: No carotid bruit or JVD.  Cardiovascular:     Rate and Rhythm: Normal rate and regular rhythm.     Heart sounds: Normal heart sounds. No murmur.  Pulmonary:     Effort: Pulmonary effort is normal. No respiratory distress.     Breath sounds: Normal breath sounds. No wheezing or rales.  Chest:     Chest wall: No tenderness.  Musculoskeletal:     Cervical back: Normal range of motion and neck supple.  Neurological:     Mental Status: She is alert and oriented to person, place, and time.    BP 120/60 (BP Location: Left Arm, Patient Position: Sitting, Cuff Size: Normal)   Pulse 78   Temp (!) 97.3 F (36.3 C) (Temporal)   Resp 18   Ht 5\' 4"  (1.626 m)   Wt 126 lb 12.8 oz (57.5 kg)   SpO2 97%   BMI 21.77 kg/m  Wt Readings from Last 3 Encounters:  11/04/19 126 lb 12.8 oz (57.5 kg)  07/28/19 123 lb (55.8 kg)  05/12/19 122 lb (55.3 kg)     Lab Results  Component Value Date   WBC 6.7 10/28/2019   HGB 11.6 (L) 10/28/2019   HCT 34.7 (L) 10/28/2019   PLT 437 (H) 10/28/2019   GLUCOSE 246 (H) 06/10/2019   CHOL 223 (H) 05/06/2019   TRIG 128.0 05/06/2019   HDL 62.70 05/06/2019   LDLCALC 135 (H) 05/06/2019   ALT 19 05/06/2019   AST 19 05/06/2019   NA 139 06/10/2019   K 5.1 06/10/2019   CL 109 06/10/2019   CREATININE 2.19 (H) 06/10/2019   BUN 33 (H) 06/10/2019   CO2 21 (L) 06/10/2019   TSH 1.12 05/12/2019   INR 0.97 06/02/2018   HGBA1C 6.6 (H) 05/06/2019   MICROALBUR 80.0 (H) 05/06/2019    No results found.   Assessment & Plan:  Plan  I am having Brandi Mccormick maintain her aspirin EC, famotidine, furosemide, OneTouch Verio, amLODipine, lisinopril, OneTouch Delica Plus [id number], metoprolol succinate, Durezol, moxifloxacin, glipiZIDE, hydroxyurea, and  rosuvastatin.  No orders of the defined types were placed in this encounter.   Problem List Items Addressed This Visit      Unprioritized   Essential hypertension    Well controlled, no changes to meds. Encouraged heart healthy diet such as the DASH diet and exercise as tolerated.       Relevant Orders   Lipid panel   Comprehensive metabolic panel   Microalbumin / creatinine urine ratio   Hyperlipidemia associated with type 2 diabetes mellitus ([place])    Tolerating statin, encouraged heart healthy diet, avoid trans fats, minimize simple carbs and saturated fats. Increase exercise as tolerated      Relevant Orders  Lipid panel   Comprehensive metabolic panel   Uncontrolled type 2 diabetes mellitus with hyperglycemia ([place]) - Primary    Per endo       Relevant Orders   Hemoglobin A1c   Comprehensive metabolic panel   Microalbumin / creatinine urine ratio      Follow-up: Return in about 6 months (around 05/06/2020), or if symptoms worsen or fail to improve, for hypertension, hyperlipidemia, diabetes II, annual exam, fasting.  [name], DO

## 2019-11-08 ENCOUNTER — Telehealth: Payer: Self-pay | Admitting: Family Medicine

## 2019-11-08 NOTE — Progress Notes (Signed)
  Chronic Care Management   Outreach Note  11/08/2019 Name: Brandi Mccormick MRN: [id number] DOB: 1940-11-14  Referred by: [name], DO Reason for referral : No chief complaint on file.   A second unsuccessful telephone outreach was attempted today. The patient was referred to pharmacist for assistance with care management and care coordination.  Follow Up Plan:   Raynicia Dukes [employer] Scheduler

## 2019-11-12 ENCOUNTER — Ambulatory Visit: Payer: Medicare HMO | Admitting: Podiatry

## 2019-11-12 ENCOUNTER — Encounter: Payer: Self-pay | Admitting: Podiatry

## 2019-11-12 ENCOUNTER — Other Ambulatory Visit: Payer: Self-pay

## 2019-11-12 VITALS — Temp 97.1°F

## 2019-11-12 DIAGNOSIS — E119 Type 2 diabetes mellitus without complications: Secondary | ICD-10-CM

## 2019-11-12 DIAGNOSIS — M79674 Pain in right toe(s): Secondary | ICD-10-CM

## 2019-11-12 DIAGNOSIS — M79675 Pain in left toe(s): Secondary | ICD-10-CM

## 2019-11-12 DIAGNOSIS — B351 Tinea unguium: Secondary | ICD-10-CM

## 2019-11-12 NOTE — Patient Instructions (Signed)
Diabetes Mellitus and Foot Care Foot care is an important part of your health, especially when you have diabetes. Diabetes may cause you to have problems because of poor blood flow (circulation) to your feet and legs, which can cause your skin to:  Become thinner and drier.  Break more easily.  Heal more slowly.  Peel and crack. You may also have nerve damage (neuropathy) in your legs and feet, causing decreased feeling in them. This means that you may not notice minor injuries to your feet that could lead to more serious problems. Noticing and addressing any potential problems early is the best way to prevent future foot problems. How to care for your feet Foot hygiene  Wash your feet daily with warm water and mild soap. Do not use hot water. Then, pat your feet and the areas between your toes until they are completely dry. Do not soak your feet as this can dry your skin.  Trim your toenails straight across. Do not dig under them or around the cuticle. File the edges of your nails with an emery board or nail file.  Apply a moisturizing lotion or petroleum jelly to the skin on your feet and to dry, brittle toenails. Use lotion that does not contain alcohol and is unscented. Do not apply lotion between your toes. Shoes and socks  Wear clean socks or stockings every day. Make sure they are not too tight. Do not wear knee-high stockings since they may decrease blood flow to your legs.  Wear shoes that fit properly and have enough cushioning. Always look in your shoes before you put them on to be sure there are no objects inside.  To break in new shoes, wear them for just a few hours a day. This prevents injuries on your feet. Wounds, scrapes, corns, and calluses  Check your feet daily for blisters, cuts, bruises, sores, and redness. If you cannot see the bottom of your feet, use a mirror or ask someone for help.  Do not cut corns or calluses or try to remove them with medicine.  If you  find a minor scrape, cut, or break in the skin on your feet, keep it and the skin around it clean and dry. You may clean these areas with mild soap and water. Do not clean the area with peroxide, alcohol, or iodine.  If you have a wound, scrape, corn, or callus on your foot, look at it several times a day to make sure it is healing and not infected. Check for: ? Redness, swelling, or pain. ? Fluid or blood. ? Warmth. ? Pus or a bad smell. General instructions  Do not cross your legs. This may decrease blood flow to your feet.  Do not use heating pads or hot water bottles on your feet. They may burn your skin. If you have lost feeling in your feet or legs, you may not know this is happening until it is too late.  Protect your feet from hot and cold by wearing shoes, such as at the beach or on hot pavement.  Schedule a complete foot exam at least once a year (annually) or more often if you have foot problems. If you have foot problems, report any cuts, sores, or bruises to your health care provider immediately. Contact a health care provider if:  You have a medical condition that increases your risk of infection and you have any cuts, sores, or bruises on your feet.  You have an injury that is not   healing.  You have redness on your legs or feet.  You feel burning or tingling in your legs or feet.  You have pain or cramps in your legs and feet.  Your legs or feet are numb.  Your feet always feel cold.  You have pain around a toenail. Get help right away if:  You have a wound, scrape, corn, or callus on your foot and: ? You have pain, swelling, or redness that gets worse. ? You have fluid or blood coming from the wound, scrape, corn, or callus. ? Your wound, scrape, corn, or callus feels warm to the touch. ? You have pus or a bad smell coming from the wound, scrape, corn, or callus. ? You have a fever. ? You have a red line going up your leg. Summary  Check your feet every day  for cuts, sores, red spots, swelling, and blisters.  Moisturize feet and legs daily.  Wear shoes that fit properly and have enough cushioning.  If you have foot problems, report any cuts, sores, or bruises to your health care provider immediately.  Schedule a complete foot exam at least once a year (annually) or more often if you have foot problems. This information is not intended to replace advice given to you by your health care provider. Make sure you discuss any questions you have with your health care provider. Document Revised: 05/12/2019 Document Reviewed: 09/20/2016 Elsevier Patient Education  2020 [employer] Inc.  

## 2019-11-14 ENCOUNTER — Other Ambulatory Visit: Payer: Self-pay | Admitting: Internal Medicine

## 2019-11-16 ENCOUNTER — Telehealth: Payer: Self-pay | Admitting: Internal Medicine

## 2019-11-16 NOTE — Telephone Encounter (Signed)
Patient called re: patient states she is unable to get her RX for glipiZIDE (GLUCOTROL XL) 5 MG 24 hr tablet due to it being too soon per PHARM. Patient states she takes 2 tablets daily of the above medication however, the RX is written to take 1 tablet daily. Patient is not sure what to do because she only has 3 tablets left of the medication listed above. Patient states she does have Metformin but that Dr. Cruzita Lederer took her off of the Metformin. Please call patient at ph# [phone] to advise. Patient's PHARM is  [facility] ([facility]), [place] - [address] Phone:  [phone]  Fax:  [phone]

## 2019-11-17 MED ORDER — GLIPIZIDE ER 5 MG PO TB24: 10.0000 mg | ORAL_TABLET | Freq: Every day | ORAL | 2 refills | Status: DC

## 2019-11-17 NOTE — Telephone Encounter (Signed)
Medication updated and re-sent to her pharmacy.

## 2019-11-18 NOTE — Progress Notes (Signed)
Subjective: Brandi Mccormick presents today for follow up of preventative diabetic foot care and painful mycotic nails b/l that are difficult to trim. Pain interferes with ambulation. Aggravating factors include wearing enclosed shoe gear. Pain is relieved with periodic professional debridement.   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  Allergies  Allergen Reactions  . Penicillins Nausea And Vomiting    Has patient had a PCN reaction causing immediate rash, facial/tongue/throat swelling, SOB or lightheadedness with hypotension: no Has patient had a PCN reaction causing severe rash involving mucus membranes or skin necrosis: no Has patient had a PCN reaction that required hospitalization: no Has patient had a PCN reaction occurring within the last 10 years: no If all of the above answers are "NO", then may proceed with Cephalosporin use.      Objective: Vitals:   11/12/19 1030  Temp: (!) 97.1 F (36.2 C)    Pt 79 y.o. year old female  in NAD. AAO x 3.   Vascular Examination:  Capillary refill time to digits immediate b/l. Palpable DP pulses b/l. Faintly palpable PT pulses b/l. Skin temperature gradient within normal limits b/l.  Dermatological Examination: Pedal skin with normal turgor, texture and tone bilaterally. No open wounds bilaterally. No interdigital macerations bilaterally. Toenails 1-5 b/l elongated, dystrophic, thickened, crumbly with subungual debris and tenderness to dorsal palpation.  Musculoskeletal: Normal muscle strength 5/5 to all lower extremity muscle groups bilaterally, no pain crepitus or joint limitation noted with ROM b/l and bunion deformity noted b/l  Neurological: Protective sensation intact 5/5 intact bilaterally with 10g monofilament b/l Vibratory sensation intact b/l  Assessment: 1. Pain due to onychomycosis of toenails of  both feet   2. Controlled type 2 diabetes mellitus without complication, without long-term current use of insulin ([place])    Plan: -Continue diabetic foot care principles. Literature dispensed on today.  -Toenails 1-5 b/l were debrided in length and girth with sterile nail nippers and dremel without iatrogenic bleeding.  -Patient to continue soft, supportive shoe gear daily. -Patient to report any pedal injuries to medical professional immediately. -Patient/POA to call should there be question/concern in the interim.  Return in about 3 months (around 02/12/2020) for diabetic nail trim.

## 2019-11-21 DIAGNOSIS — R69 Illness, unspecified: Secondary | ICD-10-CM | POA: Diagnosis not present

## 2019-11-22 DIAGNOSIS — R69 Illness, unspecified: Secondary | ICD-10-CM | POA: Diagnosis not present

## 2019-11-24 ENCOUNTER — Ambulatory Visit: Payer: Medicare HMO | Attending: Internal Medicine

## 2019-11-24 DIAGNOSIS — Z23 Encounter for immunization: Secondary | ICD-10-CM

## 2019-11-24 NOTE — Progress Notes (Signed)
   Covid-19 Vaccination Clinic  Name:  Brandi Mccormick    MRN: [id number] DOB: 1941/08/20  11/24/2019  [name] was observed post Covid-19 immunization for 15 minutes without incident. She was provided with Vaccine Information Sheet and instruction to access the V-Safe system.   [name] was instructed to call 911 with any severe reactions post vaccine: Marland Kitchen Difficulty breathing  . Swelling of face and throat  . A fast heartbeat  . A bad rash all over body  . Dizziness and weakness   Immunizations Administered    Name Date Dose VIS Date Route   Pfizer COVID-19 Vaccine 11/24/2019 11:24 AM 0.3 mL 08/13/2019 Intramuscular   Manufacturer: [place]   Lot: [id number]   [place]: [id number]

## 2019-12-01 DIAGNOSIS — R69 Illness, unspecified: Secondary | ICD-10-CM | POA: Diagnosis not present

## 2019-12-23 ENCOUNTER — Other Ambulatory Visit: Payer: Self-pay | Admitting: Family Medicine

## 2019-12-27 ENCOUNTER — Other Ambulatory Visit: Payer: Self-pay | Admitting: Family Medicine

## 2019-12-27 ENCOUNTER — Other Ambulatory Visit: Payer: Self-pay | Admitting: Nurse Practitioner

## 2019-12-27 DIAGNOSIS — I10 Essential (primary) hypertension: Secondary | ICD-10-CM

## 2020-01-04 ENCOUNTER — Other Ambulatory Visit: Payer: Self-pay | Admitting: Internal Medicine

## 2020-01-08 DIAGNOSIS — R69 Illness, unspecified: Secondary | ICD-10-CM | POA: Diagnosis not present

## 2020-01-11 DIAGNOSIS — I129 Hypertensive chronic kidney disease with stage 1 through stage 4 chronic kidney disease, or unspecified chronic kidney disease: Secondary | ICD-10-CM | POA: Diagnosis not present

## 2020-01-11 DIAGNOSIS — E785 Hyperlipidemia, unspecified: Secondary | ICD-10-CM | POA: Diagnosis not present

## 2020-01-11 DIAGNOSIS — D473 Essential (hemorrhagic) thrombocythemia: Secondary | ICD-10-CM | POA: Diagnosis not present

## 2020-01-11 DIAGNOSIS — N184 Chronic kidney disease, stage 4 (severe): Secondary | ICD-10-CM | POA: Diagnosis not present

## 2020-01-11 DIAGNOSIS — N051 Unspecified nephritic syndrome with focal and segmental glomerular lesions: Secondary | ICD-10-CM | POA: Diagnosis not present

## 2020-01-11 DIAGNOSIS — E1122 Type 2 diabetes mellitus with diabetic chronic kidney disease: Secondary | ICD-10-CM | POA: Diagnosis not present

## 2020-01-25 ENCOUNTER — Inpatient Hospital Stay: Payer: Medicare HMO | Attending: Oncology | Admitting: Oncology

## 2020-01-25 ENCOUNTER — Telehealth: Payer: Self-pay | Admitting: Oncology

## 2020-01-25 ENCOUNTER — Other Ambulatory Visit: Payer: Self-pay

## 2020-01-25 ENCOUNTER — Inpatient Hospital Stay: Payer: Medicare HMO

## 2020-01-25 VITALS — BP 131/62 | HR 77 | Temp 97.5°F | Resp 18 | Ht 64.0 in | Wt 127.2 lb

## 2020-01-25 DIAGNOSIS — D471 Chronic myeloproliferative disease: Secondary | ICD-10-CM

## 2020-01-25 DIAGNOSIS — D45 Polycythemia vera: Secondary | ICD-10-CM | POA: Diagnosis not present

## 2020-01-25 DIAGNOSIS — Z79899 Other long term (current) drug therapy: Secondary | ICD-10-CM | POA: Diagnosis not present

## 2020-01-25 DIAGNOSIS — I10 Essential (primary) hypertension: Secondary | ICD-10-CM | POA: Diagnosis not present

## 2020-01-25 DIAGNOSIS — E119 Type 2 diabetes mellitus without complications: Secondary | ICD-10-CM | POA: Diagnosis not present

## 2020-01-25 LAB — CBC WITH DIFFERENTIAL (CANCER CENTER ONLY)
Abs Immature Granulocytes: 0.02 10*3/uL (ref 0.00–0.07)
Basophils Absolute: 0 10*3/uL (ref 0.0–0.1)
Basophils Relative: 1 %
Eosinophils Absolute: 0.2 10*3/uL (ref 0.0–0.5)
Eosinophils Relative: 3 %
HCT: 32.5 % — ABNORMAL LOW (ref 36.0–46.0)
Hemoglobin: 11 g/dL — ABNORMAL LOW (ref 12.0–15.0)
Immature Granulocytes: 0 %
Lymphocytes Relative: 28 %
Lymphs Abs: 1.8 10*3/uL (ref 0.7–4.0)
MCH: 36.4 pg — ABNORMAL HIGH (ref 26.0–34.0)
MCHC: 33.8 g/dL (ref 30.0–36.0)
MCV: 107.6 fL — ABNORMAL HIGH (ref 80.0–100.0)
Monocytes Absolute: 0.6 10*3/uL (ref 0.1–1.0)
Monocytes Relative: 9 %
Neutro Abs: 3.8 10*3/uL (ref 1.7–7.7)
Neutrophils Relative %: 59 %
Platelet Count: 436 10*3/uL — ABNORMAL HIGH (ref 150–400)
RBC: 3.02 MIL/uL — ABNORMAL LOW (ref 3.87–5.11)
RDW: 14.2 % (ref 11.5–15.5)
WBC Count: 6.4 10*3/uL (ref 4.0–10.5)
nRBC: 0 % (ref 0.0–0.2)

## 2020-01-25 NOTE — Telephone Encounter (Signed)
Scheduled per 5/25 los. Printed avs and calendar for pt.

## 2020-01-25 NOTE — Progress Notes (Signed)
  [facility] OFFICE PROGRESS NOTE   Diagnosis: Polycythemia vera  INTERVAL HISTORY:   Ms. Hemrick returns as scheduled.  She continues hydroxyurea.  No complaint.  No pruritus, bleeding, or symptom of thrombosis.  No mouth ulcers. She has received the COVID-19 vaccine. Objective:  Vital signs in last 24 hours:  Blood pressure 131/62, pulse 77, temperature (!) 97.5 F (36.4 C), temperature source Temporal, resp. rate 18, height 5\' 4"  (1.626 m), weight 127 lb 3.2 oz (57.7 kg), SpO2 100 %.    HEENT: No thrush or ulcers Resp: Lungs clear bilaterally Cardio: Regular rate and rhythm GI: No hepatosplenomegaly Vascular: No leg edema    Lab Results:  Lab Results  Component Value Date   WBC 6.4 01/25/2020   HGB 11.0 (L) 01/25/2020   HCT 32.5 (L) 01/25/2020   MCV 107.6 (H) 01/25/2020   PLT 436 (H) 01/25/2020   NEUTROABS 3.8 01/25/2020    CMP  Lab Results  Component Value Date   NA 141 11/04/2019   K 4.8 11/04/2019   CL 108 11/04/2019   CO2 27 11/04/2019   GLUCOSE 250 (H) 11/04/2019   BUN 35 (H) 11/04/2019   CREATININE 1.95 (H) 11/04/2019   CALCIUM 9.6 11/04/2019   PROT 6.2 11/04/2019   ALBUMIN 3.6 11/04/2019   AST 14 11/04/2019   ALT 16 11/04/2019   ALKPHOS 56 11/04/2019   BILITOT 0.4 11/04/2019   GFRNONAA 21 (L) 06/10/2019   GFRAA 24 (L) 06/10/2019     Medications: I have reviewed the patient's current medications.   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 Type 2 diabetes 3. Hypertension 4. Focal/segmental glomerular sclerosis    Disposition: Ms. Reeb appears stable.  The hemoglobin/hematocrit remain in goal range.  She will  continue hydroxyurea at the current dose.  She will return for lab visit in 3 months and an office visit in 6 months.  Betsy Coder, MD  01/25/2020  9:14 AM

## 2020-01-26 DIAGNOSIS — H2511 Age-related nuclear cataract, right eye: Secondary | ICD-10-CM | POA: Diagnosis not present

## 2020-01-26 DIAGNOSIS — H5213 Myopia, bilateral: Secondary | ICD-10-CM | POA: Diagnosis not present

## 2020-01-31 ENCOUNTER — Other Ambulatory Visit: Payer: Self-pay | Admitting: Family Medicine

## 2020-02-02 DIAGNOSIS — R69 Illness, unspecified: Secondary | ICD-10-CM | POA: Diagnosis not present

## 2020-02-14 ENCOUNTER — Other Ambulatory Visit: Payer: Self-pay

## 2020-02-14 MED ORDER — GLIPIZIDE ER 5 MG PO TB24: 10.0000 mg | ORAL_TABLET | Freq: Every day | ORAL | 2 refills | Status: DC

## 2020-02-18 ENCOUNTER — Other Ambulatory Visit: Payer: Self-pay

## 2020-02-18 ENCOUNTER — Encounter: Payer: Self-pay | Admitting: Podiatry

## 2020-02-18 ENCOUNTER — Ambulatory Visit: Payer: Medicare HMO | Admitting: Podiatry

## 2020-02-18 DIAGNOSIS — B351 Tinea unguium: Secondary | ICD-10-CM

## 2020-02-18 DIAGNOSIS — M79674 Pain in right toe(s): Secondary | ICD-10-CM

## 2020-02-18 DIAGNOSIS — M79675 Pain in left toe(s): Secondary | ICD-10-CM | POA: Diagnosis not present

## 2020-02-18 DIAGNOSIS — E119 Type 2 diabetes mellitus without complications: Secondary | ICD-10-CM

## 2020-02-18 DIAGNOSIS — L600 Ingrowing nail: Secondary | ICD-10-CM

## 2020-02-18 NOTE — Patient Instructions (Addendum)
Diabetes Mellitus and Foot Care Foot care is an important part of your health, especially when you have diabetes. Diabetes may cause you to have problems because of poor blood flow (circulation) to your feet and legs, which can cause your skin to:  Become thinner and drier.  Break more easily.  Heal more slowly.  Peel and crack. You may also have nerve damage (neuropathy) in your legs and feet, causing decreased feeling in them. This means that you may not notice minor injuries to your feet that could lead to more serious problems. Noticing and addressing any potential problems early is the best way to prevent future foot problems. How to care for your feet Foot hygiene  Wash your feet daily with warm water and mild soap. Do not use hot water. Then, pat your feet and the areas between your toes until they are completely dry. Do not soak your feet as this can dry your skin.  Trim your toenails straight across. Do not dig under them or around the cuticle. File the edges of your nails with an emery board or nail file.  Apply a moisturizing lotion or petroleum jelly to the skin on your feet and to dry, brittle toenails. Use lotion that does not contain alcohol and is unscented. Do not apply lotion between your toes. Shoes and socks  Wear clean socks or stockings every day. Make sure they are not too tight. Do not wear knee-high stockings since they may decrease blood flow to your legs.  Wear shoes that fit properly and have enough cushioning. Always look in your shoes before you put them on to be sure there are no objects inside.  To break in new shoes, wear them for just a few hours a day. This prevents injuries on your feet. Wounds, scrapes, corns, and calluses  Check your feet daily for blisters, cuts, bruises, sores, and redness. If you cannot see the bottom of your feet, use a mirror or ask someone for help.  Do not cut corns or calluses or try to remove them with medicine.  If you  find a minor scrape, cut, or break in the skin on your feet, keep it and the skin around it clean and dry. You may clean these areas with mild soap and water. Do not clean the area with peroxide, alcohol, or iodine.  If you have a wound, scrape, corn, or callus on your foot, look at it several times a day to make sure it is healing and not infected. Check for: ? Redness, swelling, or pain. ? Fluid or blood. ? Warmth. ? Pus or a bad smell. General instructions  Do not cross your legs. This may decrease blood flow to your feet.  Do not use heating pads or hot water bottles on your feet. They may burn your skin. If you have lost feeling in your feet or legs, you may not know this is happening until it is too late.  Protect your feet from hot and cold by wearing shoes, such as at the beach or on hot pavement.  Schedule a complete foot exam at least once a year (annually) or more often if you have foot problems. If you have foot problems, report any cuts, sores, or bruises to your health care provider immediately. Contact a health care provider if:  You have a medical condition that increases your risk of infection and you have any cuts, sores, or bruises on your feet.  You have an injury that is not   healing.  You have redness on your legs or feet.  You feel burning or tingling in your legs or feet.  You have pain or cramps in your legs and feet.  Your legs or feet are numb.  Your feet always feel cold.  You have pain around a toenail. Get help right away if:  You have a wound, scrape, corn, or callus on your foot and: ? You have pain, swelling, or redness that gets worse. ? You have fluid or blood coming from the wound, scrape, corn, or callus. ? Your wound, scrape, corn, or callus feels warm to the touch. ? You have pus or a bad smell coming from the wound, scrape, corn, or callus. ? You have a fever. ? You have a red line going up your leg. Summary  Check your feet every day  for cuts, sores, red spots, swelling, and blisters.  Moisturize feet and legs daily.  Wear shoes that fit properly and have enough cushioning.  If you have foot problems, report any cuts, sores, or bruises to your health care provider immediately.  Schedule a complete foot exam at least once a year (annually) or more often if you have foot problems. This information is not intended to replace advice given to you by your health care provider. Make sure you discuss any questions you have with your health care provider. Document Revised: 05/12/2019 Document Reviewed: 09/20/2016 Elsevier Patient Education  2020 Elsevier Inc.  

## 2020-02-24 ENCOUNTER — Ambulatory Visit: Payer: Medicare HMO | Admitting: Internal Medicine

## 2020-02-24 ENCOUNTER — Encounter: Payer: Self-pay | Admitting: Internal Medicine

## 2020-02-24 ENCOUNTER — Other Ambulatory Visit: Payer: Self-pay

## 2020-02-24 VITALS — BP 116/60 | HR 72 | Ht 64.0 in | Wt 126.0 lb

## 2020-02-24 DIAGNOSIS — E1165 Type 2 diabetes mellitus with hyperglycemia: Secondary | ICD-10-CM

## 2020-02-24 DIAGNOSIS — E1169 Type 2 diabetes mellitus with other specified complication: Secondary | ICD-10-CM | POA: Diagnosis not present

## 2020-02-24 DIAGNOSIS — E1151 Type 2 diabetes mellitus with diabetic peripheral angiopathy without gangrene: Secondary | ICD-10-CM

## 2020-02-24 DIAGNOSIS — Z8639 Personal history of other endocrine, nutritional and metabolic disease: Secondary | ICD-10-CM | POA: Diagnosis not present

## 2020-02-24 DIAGNOSIS — E785 Hyperlipidemia, unspecified: Secondary | ICD-10-CM

## 2020-02-24 DIAGNOSIS — IMO0002 Reserved for concepts with insufficient information to code with codable children: Secondary | ICD-10-CM

## 2020-02-24 LAB — POCT GLYCOSYLATED HEMOGLOBIN (HGB A1C): Hemoglobin A1C: 7.2 % — AB (ref 4.0–5.6)

## 2020-02-24 NOTE — Addendum Note (Signed)
Addended by: Cardell Peach I on: 02/24/2020 04:45 PM   Modules accepted: Orders

## 2020-02-24 NOTE — Patient Instructions (Addendum)
Please continue: - Glipizide XL 10 mg before breakfast   Please return in 4 months with your sugar log.

## 2020-02-24 NOTE — Progress Notes (Signed)
Patient ID: Brandi Mccormick, female   DOB: 1941-05-26, 79 y.o.   MRN: [id number]  This visit occurred during the SARS-CoV-2 public health emergency.  Safety protocols were in place, including screening questions prior to the visit, additional usage of staff PPE, and extensive cleaning of exam room while observing appropriate contact time as indicated for disinfecting solutions.   HPI: Brandi Mccormick is a 79 y.o.-year-old female, presenting for f/u for DM2, dx 2013, non-insulin-dependent, uncontrolled, with complications (microalbuminuria) and h/o hyperthyroidism. Last visit 3.5 months ago  DM2: Reviewed HbA1c levels: Lab Results  Component Value Date   HGBA1C 7.2 (H) 11/04/2019   HGBA1C 6.6 (H) 05/06/2019   HGBA1C 6.1 12/21/2018   HGBA1C 8.3 (H) 09/11/2018   HGBA1C 6.9 (A) 06/04/2018   HGBA1C 7.6 (A) 02/02/2018   HGBA1C 7.4 10/03/2017   HGBA1C 7.0 (H) 04/18/2017   HGBA1C 6.9 02/14/2017   HGBA1C 7.3 11/15/2016   HGBA1C 6.6 06/28/2016   HGBA1C 7.1 03/28/2016   HGBA1C 7.5 12/29/2015   HGBA1C 6.9 08/30/2015   HGBA1C 6.7 05/02/2015   Pt is on a regimen of: - Glipizide XL 5 mg in a.m.  >> 10 mg in a.m. We tried Tradjenta 5 mg daily in am - too expensive. She was previously on Janumet XR and Metformin ER, but this was stopped 05/2019 due to declining kidney function.  Pt checks her sugars once a day: - am: 109-132, 142 >> 136-168 >> 85-152 - 2h after b'fast:  122-138 >> 132 >> n/c >> 127-155 - before lunch:80-135, 152 >> 101, 205 >> 123-148 - 2h after lunch: 101, 142, 145, 207, 209 >> 133-153 - before dinner:   131, 138, 238, 239 >> 88, 117-140 - 2h after dinner: 64-151 >> 75-146 >> 117 >> 141, 152 - bedtime: 123, 154 >> 69, 149 >> 137-169 >> 153 - nighttime: n/c >> 161 >> n/c Lowest sugar: 64 (delayed lunch) >> 101; it is unclear at which level she has hypoglycemia awareness Highest sugar: 152 >> 239 >> 155.  Meter: OneTouch Verio.  Pt's meals are: - Breakfast: egg, coffee,  juice; sometime - Lunch: No Lunch - Dinner: meat + vegetables >> late dinner during the weekend as her son is cooking and brings her food - Snacks: 1-2 She is to drink regular sodas, but stopped.  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:  Lab Results  Component Value Date   BUN 35 (H) 11/04/2019   CREATININE 1.95 (H) 11/04/2019   Lab Results  Component Value Date   GFRAA 24 (L) 06/10/2019   GFRAA 27 (L) 04/28/2019   GFRAA 43 (L) 07/27/2018   GFRAA >60 06/26/2018   GFRAA 38 (L) 06/02/2018   GFRAA 82 06/28/2016   GFRAA >90 06/25/2014   GFRAA >90 06/24/2014   GFRAA >90 10/23/2013  12/21/2018: GFR 28.9   She has microalbuminuria: Lab Results  Component Value Date   MICRALBCREAT 43.8 (H) 11/04/2019   MICRALBCREAT 77.6 (H) 05/06/2019   MICRALBCREAT 422.7 (H) 04/02/2018   MICRALBCREAT 225.8 (H) 11/15/2016   MICRALBCREAT 48.8 (H) 06/28/2016   MICRALBCREAT 9.9 02/21/2014   MICRALBCREAT 8.8 11/16/2013  On lisinopril 40.  -+ HL; last set of lipids: Lab Results  Component Value Date   CHOL 146 11/04/2019   HDL 53.90 11/04/2019   LDLCALC 78 11/04/2019   TRIG 72.0 11/04/2019   CHOLHDL 3 11/04/2019  Prev. on Simvastatin >> off in 06/2016 b/c fatigue.  She started pravastatin 80 mg daily in 12/2016 >> currently on Crestor,  off Zetia.  -No numbness and tingling in her feet.  - Last eye exam 2021: No DR reportedly; Had OS cataract sx.   She also has a h/o hyperthyroidism, previously treated with medication, more than 20 years ago.  No other history available.  Pt denies: - feeling nodules in neck - hoarseness - dysphagia - choking - SOB with lying down  TFTs were normal: Lab Results  Component Value Date   TSH 1.12 05/12/2019   TSH 0.635 06/26/2018   TSH 1.11 04/02/2018   TSH 1.13 07/03/2017   TSH 1.12 06/28/2016   TSH 1.29 06/23/2015   TSH 1.44 10/18/2014   TSH 0.93 08/15/2014   TSH 0.44 02/17/2014   TSH 0.28 (L) 12/20/2013   FREET4 0.85 05/12/2019   FREET4 0.93  07/03/2017   FREET4 0.96 06/28/2016   FREET4 0.91 08/15/2014   FREET4 1.00 02/17/2014   FREET4 0.97 12/20/2013   T3FREE 2.7 05/12/2019   T3FREE 3.2 07/03/2017   T3FREE 3.1 06/28/2016   T3FREE 3.2 08/15/2014   T3FREE 2.6 02/17/2014   T3FREE 2.9 12/20/2013   ROS: Constitutional: no weight gain/no weight loss, no fatigue, no subjective hyperthermia, no subjective hypothermia Eyes: no blurry vision, no xerophthalmia ENT: no sore throat, no nodules palpated in neck, no dysphagia, no odynophagia, no hoarseness Cardiovascular: no CP/no SOB/no palpitations/no leg swelling Respiratory: no cough/no SOB/no wheezing Gastrointestinal: no N/no V/no D/no C/no acid reflux Musculoskeletal: no muscle aches/no joint aches Skin: no rashes, no hair loss Neurological: no tremors/no numbness/no tingling/no dizziness  I reviewed pt's medications, allergies, PMH, social hx, family hx, and changes were documented in the history of present illness. Otherwise, unchanged from my initial visit note.  Past Medical History:  Diagnosis Date  . Diabetes ([place])   . GERD (gastroesophageal reflux disease)   . Hyperlipidemia   . Hypertension   . Hyperthyroidism   . Pneumonia   . Shingles    Past Surgical History:  Procedure Laterality Date  . CATARACT EXTRACTION Left 2020  . COLONOSCOPY    . DENTAL SURGERY     Social History   Socioeconomic History  . Marital status: Married    Spouse name: Not on file  . Number of children: Not on file  . Years of education: Not on file  . Highest education level: Not on file  Occupational History  . Occupation: [employer]--    Comment: retired  Tobacco Use  . Smoking status: Never Smoker  . Smokeless tobacco: Never Used  Vaping Use  . Vaping Use: Never used  Substance and Sexual Activity  . Alcohol use: Yes    Alcohol/week: 0.0 standard drinks    Comment: occasional wine cooler  . Drug use: No  . Sexual activity: Never    Partners: Male  Other Topics Concern  .  Not on file  Social History Narrative   Exercise-- yard work,  Walking in Golf Strain:   . Difficulty of Paying Living Expenses:   Food Insecurity:   . Worried About Charity fundraiser in the Last Year:   . Arboriculturist in the Last Year:   Transportation Needs:   . Film/video editor (Medical):   Marland Kitchen Lack of Transportation (Non-Medical):   Physical Activity:   . Days of Exercise per Week:   . Minutes of Exercise per Session:   Stress:   . Feeling of Stress :   Social Connections:   .  Frequency of Communication with Friends and Family:   . Frequency of Social Gatherings with Friends and Family:   . Attends Religious Services:   . Active Member of Clubs or Organizations:   . Attends Archivist Meetings:   Marland Kitchen Marital Status:   Intimate Partner Violence:   . Fear of Current or Ex-Partner:   . Emotionally Abused:   Marland Kitchen Physically Abused:   . Sexually Abused:    Current Outpatient Medications on File Prior to Visit  Medication Sig Dispense Refill  . amLODipine (NORVASC) 10 MG tablet Take 1 tablet by mouth once daily 90 tablet 0  . aspirin EC 81 MG tablet Take 81 mg by mouth daily.    . famotidine (PEPCID) 20 MG tablet Take 20 mg by mouth daily.    . furosemide (LASIX) 20 MG tablet Take 20 mg by mouth 2 (two) times daily as needed.    Marland Kitchen glipiZIDE (GLUCOTROL XL) 5 MG 24 hr tablet Take 2 tablets (10 mg total) by mouth daily. 90 tablet 2  . hydroxyurea (HYDREA) 500 MG capsule Take 1 capsule by mouth once daily 90 capsule 0  . Lancets (ONETOUCH DELICA PLUS [id number]) MISC USE   TO CHECK GLUCOSE TWICE DAILY 100 each 11  . lisinopril (ZESTRIL) 40 MG tablet Take 1 tablet by mouth once daily 90 tablet 1  . metoprolol succinate (TOPROL-XL) 25 MG 24 hr tablet Take 1 tablet by mouth once daily 90 tablet 0  . ONETOUCH VERIO test strip USE 1 STRIP TO CHECK GLUCOSE ONCE DAILY 50 each 11  . rosuvastatin (CRESTOR) 40 MG tablet  Take 1 tablet by mouth once daily 90 tablet 1   No current facility-administered medications on file prior to visit.   Allergies  Allergen Reactions  . Penicillins Nausea And Vomiting    Has patient had a PCN reaction causing immediate rash, facial/tongue/throat swelling, SOB or lightheadedness with hypotension: no Has patient had a PCN reaction causing severe rash involving mucus membranes or skin necrosis: no Has patient had a PCN reaction that required hospitalization: no Has patient had a PCN reaction occurring within the last 10 years: no If all of the above answers are "NO", then may proceed with Cephalosporin use.    Family History  Problem Relation Age of Onset  . Diabetes Sister   . Diabetes Brother   . Diabetes Sister   . Diabetes Mother        and on mother's side of family  . Colon cancer Mother   . Stroke Mother   . Diabetes Father        and on father's side of family  . Heart disease Neg Hx   . Rectal cancer Neg Hx   . Stomach cancer Neg Hx     PE: BP 116/60   Pulse 72   Ht 5\' 4"  (1.626 m)   Wt 126 lb (57.2 kg)   SpO2 99%   BMI 21.63 kg/m  Body mass index is 21.63 kg/m. Wt Readings from Last 3 Encounters:  02/24/20 126 lb (57.2 kg)  01/25/20 127 lb 3.2 oz (57.7 kg)  11/04/19 126 lb 12.8 oz (57.5 kg)   Constitutional: Normal weight, in NAD Eyes: PERRLA, EOMI, no exophthalmos ENT: moist mucous membranes, no thyromegaly, no cervical lymphadenopathy Cardiovascular: RRR, No MRG Respiratory: CTA B Gastrointestinal: abdomen soft, NT, ND, BS+ Musculoskeletal: no deformities, strength intact in all 4 Skin: moist, warm, no rashes Neurological: no tremor with outstretched hands, DTR normal in all  4  ASSESSMENT: 1. DM2, non-insulin-dependent, uncontrolled, with complications - MAU  2. H/o Hyperthyroidism - 20 years ago - was on meds, cannot remember name  3. HL  PLAN:  1. Patient with history of uncontrolled diabetes with improved control in the  last years, only on oral antidiabetic regimen.  Unfortunately, we had to stop Metformin ER in the past due to declining kidney function.  She continues just on glipizide, which we increased at last visit.  She refused injectables.  We are limited in the medication choices due to her decreased kidney function.  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 Johnney Ou.  She is not on prednisone.  She is also on hydroxyurea for chronic myeloproliferative disease. -At last visit, sugars are more variable with occasional sugars in the 200s, alternating with sugars in the 100s. -At this visit, sugars are very little fluctuating, improved since last visit. She has no sugars in the 200s and the lowest in the 80s. Overall, I am pleased with her control only on glipizide. We can definitely continue with this. We again discussed that GLP-1 receptor agonists or insulin already only possible antidiabetic medication additions in the setting of advanced CKD, but for now, I do not feel we need to escalate her regimen. - I suggested to:  Patient Instructions  Please continue: - Glipizide XL 10 mg before breakfast   Please return in 4 months with your sugar log.   - we checked her HbA1c: 7.2% (stable) - advised to check sugars at different times of the day - 1x a day, rotating check times - advised for yearly eye exams >> she is UTD - return to clinic in 3-4 months  2. H/o hyperthyroidism -No thyrotoxic complaints -Reviewed latest TSH and this was normal: Lab Results  Component Value Date   TSH 1.12 05/12/2019   3. HL -Related lipid panel from 11/2019: Fractions at goal Lab Results  Component Value Date   CHOL 146 11/04/2019   HDL 53.90 11/04/2019   LDLCALC 78 11/04/2019   TRIG 72.0 11/04/2019   CHOLHDL 3 11/04/2019  -Continues high-dose Crestor without side effects  Philemon Kingdom, MD PhD [facility]

## 2020-02-26 NOTE — Progress Notes (Signed)
Subjective: Brandi Mccormick presents today preventative diabetic foot care and painful mycotic nails b/l that are difficult to trim. Pain interferes with ambulation. Aggravating factors include wearing enclosed shoe gear. Pain is relieved with periodic professional debridement.   Patient states her left great toe is very sore on today. She denies any redness, drainage or swelling.  [name], DO is patient's PCP. Last visit was: 11/04/2019.  Past Medical History:  Diagnosis Date  . Diabetes ([place])   . GERD (gastroesophageal reflux disease)   . Hyperlipidemia   . Hypertension   . Hyperthyroidism   . Pneumonia   . Shingles      Current Outpatient Medications on File Prior to Visit  Medication Sig Dispense Refill  . amLODipine (NORVASC) 10 MG tablet Take 1 tablet by mouth once daily 90 tablet 0  . aspirin EC 81 MG tablet Take 81 mg by mouth daily.    . famotidine (PEPCID) 20 MG tablet Take 20 mg by mouth daily.    . furosemide (LASIX) 20 MG tablet Take 20 mg by mouth 2 (two) times daily as needed.    Marland Kitchen glipiZIDE (GLUCOTROL XL) 5 MG 24 hr tablet Take 2 tablets (10 mg total) by mouth daily. 90 tablet 2  . hydroxyurea (HYDREA) 500 MG capsule Take 1 capsule by mouth once daily 90 capsule 0  . Lancets (ONETOUCH DELICA PLUS [id number]) MISC USE   TO CHECK GLUCOSE TWICE DAILY 100 each 11  . lisinopril (ZESTRIL) 40 MG tablet Take 1 tablet by mouth once daily 90 tablet 1  . metoprolol succinate (TOPROL-XL) 25 MG 24 hr tablet Take 1 tablet by mouth once daily 90 tablet 0  . ONETOUCH VERIO test strip USE 1 STRIP TO CHECK GLUCOSE ONCE DAILY 50 each 11  . rosuvastatin (CRESTOR) 40 MG tablet Take 1 tablet by mouth once daily 90 tablet 1   No current facility-administered medications on file prior to visit.     Allergies  Allergen Reactions  . Penicillins Nausea And Vomiting    Has patient had a PCN reaction causing immediate rash, facial/tongue/throat swelling, SOB or lightheadedness  with hypotension: no Has patient had a PCN reaction causing severe rash involving mucus membranes or skin necrosis: no Has patient had a PCN reaction that required hospitalization: no Has patient had a PCN reaction occurring within the last 10 years: no If all of the above answers are "NO", then may proceed with Cephalosporin use.     Objective: MERCIA DOWE is a pleasant 79 y.o. African American female WD, WN in NAD. AAO x 3.  There were no vitals filed for this visit.  Vascular Examination: Neurovascular status unchanged b/l lower extremities. Capillary refill time to digits immediate b/l. Palpable pedal pulses b/l LE. Pedal hair sparse. Lower extremity skin temperature gradient within normal limits. No pain with calf compression b/l. No edema noted b/l lower extremities.  Dermatological Examination: Pedal skin with normal turgor, texture and tone bilaterally. No open wounds bilaterally. No interdigital macerations bilaterally. Toenails 1-5 b/l elongated, discolored, dystrophic, thickened, crumbly with subungual debris and tenderness to dorsal palpation. Incurvated nailplate lateral border(s) L hallux.  Nail border hypertrophy absent. There is tenderness to palpation. Sign(s) of infection: no clinical signs of infection noted on examination today..  Musculoskeletal: Normal muscle strength 5/5 to all lower extremity muscle groups bilaterally. No pain crepitus or joint limitation noted with ROM b/l. No gross bony deformities bilaterally. Patient ambulates independent of any assistive aids.  Neurological Examination:  Protective sensation intact 5/5 intact bilaterally with 10g monofilament b/l. Vibratory sensation intact b/l. Proprioception intact bilaterally. Babinski reflex negative b/l. Clonus negative b/l.  Last A1c: Hemoglobin A1C Latest Ref Rng & Units 02/24/2020 11/04/2019 05/06/2019  HGBA1C 4.0 - 5.6 % 7.2(A) 7.2(H) 6.6(H)  Some recent data might be hidden    Assessment: 1. Pain  due to onychomycosis of toenails of both feet   2. Ingrown toenail without infection   3. Controlled type 2 diabetes mellitus without complication, without long-term current use of insulin ([place])   Plan: -Examined patient. -No new findings. No new orders. -Continue diabetic foot care principles. -Toenails 1-5 b/l were debrided in length and girth with sterile nail nippers and dremel without iatrogenic bleeding.  -Offending nail border debrided and curretaged L hallux utilizing sterile nail nipper and currette. Border cleansed with alcohol and triple antibiotic applied. No further treatment required by patient/caregiver. -Patient to report any pedal injuries to medical professional immediately. -Patient to continue soft, supportive shoe gear daily. -Patient/POA to call should there be question/concern in the interim.  Return in about 3 months (around 05/20/2020) for diabetic nail trim.  Marzetta Board, DPM

## 2020-03-03 DIAGNOSIS — R69 Illness, unspecified: Secondary | ICD-10-CM | POA: Diagnosis not present

## 2020-03-24 ENCOUNTER — Other Ambulatory Visit: Payer: Self-pay | Admitting: Family Medicine

## 2020-04-05 NOTE — Progress Notes (Signed)
Subjective:   Brandi Mccormick is a 79 y.o. female who presents for Medicare Annual (Subsequent) preventive examination.  Review of Systems     Cardiac Risk Factors include: advanced age (>35men, >60 women);diabetes mellitus;dyslipidemia;hypertension     Objective:    Today's Vitals   04/06/20 0933 04/06/20 0938  BP: (!) 95/54 (!) 110/55  Pulse: 77   Temp: (!) 97 F (36.1 C)   TempSrc: Temporal   SpO2: 100%   Weight: 125 lb 12.8 oz (57.1 kg)   Height: 5\' 4"  (1.626 m)    Body mass index is 21.59 kg/m.   BP Readings from Last 3 Encounters:  04/06/20 (!) 110/55  02/24/20 116/60  01/25/20 131/62    Advanced Directives 04/06/2020 01/25/2020 04/05/2019 08/17/2018 07/27/2018 07/08/2018 06/26/2018  Does Patient Have a Medical Advance Directive? No No No No No No No  Does patient want to make changes to medical advance directive? - - No - Patient declined - - - -  Would patient like information on creating a medical advance directive? No - Patient declined No - Patient declined - No - Patient declined - No - Patient declined -    Current Medications (verified) Outpatient Encounter Medications as of 04/06/2020  Medication Sig  . amLODipine (NORVASC) 10 MG tablet Take 1 tablet by mouth once daily  . aspirin EC 81 MG tablet Take 81 mg by mouth daily.  . famotidine (PEPCID) 20 MG tablet Take 20 mg by mouth daily.  . furosemide (LASIX) 20 MG tablet Take 20 mg by mouth 2 (two) times daily as needed.  Marland Kitchen glipiZIDE (GLUCOTROL XL) 5 MG 24 hr tablet Take 2 tablets (10 mg total) by mouth daily.  . hydroxyurea (HYDREA) 500 MG capsule Take 1 capsule by mouth once daily  . Lancets (ONETOUCH DELICA PLUS [id number]) MISC USE   TO CHECK GLUCOSE TWICE DAILY  . lisinopril (ZESTRIL) 40 MG tablet Take 1 tablet by mouth once daily  . metoprolol succinate (TOPROL-XL) 25 MG 24 hr tablet Take 1 tablet by mouth once daily  . ONETOUCH VERIO test strip USE 1 STRIP TO CHECK GLUCOSE ONCE DAILY  . rosuvastatin  (CRESTOR) 40 MG tablet Take 1 tablet by mouth once daily   No facility-administered encounter medications on file as of 04/06/2020.    Allergies (verified) Penicillins   History: Past Medical History:  Diagnosis Date  . Diabetes ([place])   . GERD (gastroesophageal reflux disease)   . Hyperlipidemia   . Hypertension   . Hyperthyroidism   . Pneumonia   . Shingles    Past Surgical History:  Procedure Laterality Date  . CATARACT EXTRACTION Left 2020  . COLONOSCOPY    . DENTAL SURGERY     Family History  Problem Relation Age of Onset  . Diabetes Sister   . Diabetes Brother   . Diabetes Sister   . Diabetes Mother        and on mother's side of family  . Colon cancer Mother   . Stroke Mother   . Diabetes Father        and on father's side of family  . Heart disease Neg Hx   . Rectal cancer Neg Hx   . Stomach cancer Neg Hx    Social History   Socioeconomic History  . Marital status: Married    Spouse name: Not on file  . Number of children: Not on file  . Years of education: Not on file  . Highest education level: Not  on file  Occupational History  . Occupation: [employer]--    Comment: retired  Tobacco Use  . Smoking status: Never Smoker  . Smokeless tobacco: Never Used  Vaping Use  . Vaping Use: Never used  Substance and Sexual Activity  . Alcohol use: Yes    Alcohol/week: 0.0 standard drinks    Comment: occasional wine cooler  . Drug use: No  . Sexual activity: Never    Partners: Male  Other Topics Concern  . Not on file  Social History Narrative   Exercise-- yard work,  Walking in [place] Strain: Low Risk   . Difficulty of Paying Living Expenses: Not hard at all  Food Insecurity: No Food Insecurity  . Worried About Charity fundraiser in the Last Year: Never true  . Ran Out of Food in the Last Year: Never true  Transportation Needs: No Transportation Needs  . Lack of Transportation (Medical): No  . Lack  of Transportation (Non-Medical): No  Physical Activity:   . Days of Exercise per Week:   . Minutes of Exercise per Session:   Stress:   . Feeling of Stress :   Social Connections:   . Frequency of Communication with Friends and Family:   . Frequency of Social Gatherings with Friends and Family:   . Attends Religious Services:   . Active Member of Clubs or Organizations:   . Attends Archivist Meetings:   Marland Kitchen Marital Status:     Tobacco Counseling Counseling given: Not Answered   Clinical Intake:     Pain : No/denies pain     Activities of Daily Living In your present state of health, do you have any difficulty performing the following activities: 04/06/2020  Hearing? N  Vision? N  Difficulty concentrating or making decisions? N  Walking or climbing stairs? N  Dressing or bathing? N  Doing errands, shopping? N  Preparing Food and eating ? N  Using the Toilet? N  In the past six months, have you accidently leaked urine? N  Do you have problems with loss of bowel control? N  Managing your Medications? N  Managing your Finances? N  Housekeeping or managing your Housekeeping? N  Some recent data might be hidden    Patient Care Team: Carollee Herter, Alferd Apa, DO as PCP - General (Family Medicine) Philemon Kingdom, MD as Consulting Physician (Internal Medicine) [facility] [place] as Consulting Physician (Podiatry) Justin Mend, MD as Attending Physician (Internal Medicine) [name], DPM as Consulting Physician (Podiatry)  Indicate any recent Medical Services you may have received from other than [facility] providers in the past year (date may be approximate).     Assessment:   This is a routine wellness examination for [name]. Physical assessment deferred to PCP.  Dietary issues and exercise activities discussed: Current Exercise Habits: The patient does not participate in regular exercise at present, Exercise limited by: None  identified Diet (meal preparation, eat out, water intake, caffeinated beverages, dairy products, fruits and vegetables): well balanced    Goals    . DIET - INCREASE WATER INTAKE    . Have 3 meals a day     At lunch add maybe Glucerna, Smoothies with protein, Sandwiches with lean protein/veggies, etc.     . Reduce sugar intake to 25 grams per day     Trade cake and pie for fruit.        Depression  Screen PHQ 2/9 Scores 04/06/2020 04/05/2019 04/02/2018 03/21/2017 06/23/2015 06/23/2015 04/24/2015  PHQ - 2 Score 0 0 0 0 0 0 0    Fall Risk Fall Risk  04/06/2020 04/05/2019 04/02/2018 03/21/2017 06/23/2015  Falls in the past year? 0 0 No No No  Number falls in past yr: 0 - - - -  Injury with Fall? 0 - - - -  Risk for fall due to : - - - - -  Follow up Education provided;Falls prevention discussed - - - -   Lives w/ husband in 1 story home.  Any stairs in or around the home? No  If so, are there any without handrails? No  Home free of loose throw rugs in walkways, pet beds, electrical cords, etc? Yes  Adequate lighting in your home to reduce risk of falls? Yes   ASSISTIVE DEVICES UTILIZED TO PREVENT FALLS: none needed per pt.  Gait steady and fast without use of assistive device  Cognitive Function:   MMSE - Mini Mental State Exam 04/06/2020 04/02/2018 03/21/2017 04/24/2015  Not completed: Refused - - -  Orientation to time - 5 5 5   Orientation to Place - 5 5 5   Registration - 3 3 3   Attention/ Calculation - 4 3 4   Recall - 2 2 2   Language- name 2 objects - 2 2 2   Language- repeat - 1 1 1   Language- follow 3 step command - 3 3 3   Language- read & follow direction - 1 1 1   Write a sentence - 1 1 1   Copy design - 1 1 1   Total score - 28 27 28         Immunizations Immunization History  Administered Date(s) Administered  . PFIZER SARS-COV-2 Vaccination 10/31/2019, 11/24/2019  . Pneumococcal Polysaccharide-23 11/16/2013    TDAP status: Due, Education has been provided regarding the  importance of this vaccine. Advised may receive this vaccine at local pharmacy or Health Dept. Aware to provide a copy of the vaccination record if obtained from local pharmacy or Health Dept. Verbalized acceptance and understanding. Pneumococcal vaccine status: Up to date Covid-19 vaccine status: Completed vaccines  Screening Tests Health Maintenance  Topic Date Due  . Hepatitis C Screening  Never done  . INFLUENZA VACCINE  04/02/2020  . PNA vac Low Risk Adult (2 of 2 - PCV13) 05/05/2020 (Originally 11/17/2014)  . HEMOGLOBIN A1C  08/25/2020  . OPHTHALMOLOGY EXAM  09/14/2020  . FOOT EXAM  09/22/2020  . COLONOSCOPY  02/05/2022  . TETANUS/TDAP  06/22/2025  . COVID-19 Vaccine  Completed  . DEXA SCAN  Addressed    Health Maintenance  Health Maintenance Due  Topic Date Due  . Hepatitis C Screening  Never done  . INFLUENZA VACCINE  04/02/2020    Colorectal cancer screening: Completed 02/05/17. Repeat every 5 years Pt declines mammogram and bone density scan.    Additional Screening:  Vision Screening: Recommended annual ophthalmology exams for early detection of glaucoma and other disorders of the eye. Is the patient up to date with their annual eye exam?  Yes  per pt Who is the provider or what is the name of the office in which the patient attends annual eye exams? Dr. Ellie Lunch   Dental Screening: Recommended annual dental exams for proper oral hygiene  Community Resource Referral / Chronic Care Management: CRR required this visit?  No   CCM required this visit?  No      Plan:    Please schedule your next medicare  wellness visit with me in 1 yr.  Continue to eat heart healthy diet (full of fruits, vegetables, whole grains, lean protein, water--limit salt, fat, and sugar intake) and increase physical activity as tolerated.  Continue doing brain stimulating activities (puzzles, reading, adult coloring books, staying active) to keep memory sharp.   I have personally  reviewed and noted the following in the patient's chart:   . Medical and social history . Use of alcohol, tobacco or illicit drugs  . Current medications and supplements . Functional ability and status . Nutritional status . Physical activity . Advanced directives . List of other physicians . Hospitalizations, surgeries, and ER visits in previous 12 months . Vitals . Screenings to include cognitive, depression, and falls . Referrals and appointments  In addition, I have reviewed and discussed with patient certain preventive protocols, quality metrics, and best practice recommendations. A written personalized care plan for preventive services as well as general preventive health recommendations were provided to patient.     Shela Nevin, [place]   04/06/2020

## 2020-04-06 ENCOUNTER — Ambulatory Visit (INDEPENDENT_AMBULATORY_CARE_PROVIDER_SITE_OTHER): Payer: Medicare HMO | Admitting: *Deleted

## 2020-04-06 ENCOUNTER — Other Ambulatory Visit: Payer: Self-pay

## 2020-04-06 ENCOUNTER — Encounter: Payer: Self-pay | Admitting: *Deleted

## 2020-04-06 VITALS — BP 110/55 | HR 77 | Temp 97.0°F | Ht 64.0 in | Wt 125.8 lb

## 2020-04-06 DIAGNOSIS — Z0000 Encounter for general adult medical examination without abnormal findings: Secondary | ICD-10-CM | POA: Diagnosis not present

## 2020-04-06 NOTE — Patient Instructions (Signed)
Please schedule your next medicare wellness visit with me in 1 yr.  Continue to eat heart healthy diet (full of fruits, vegetables, whole grains, lean protein, water--limit salt, fat, and sugar intake) and increase physical activity as tolerated.  Continue doing brain stimulating activities (puzzles, reading, adult coloring books, staying active) to keep memory sharp.    Brandi Mccormick , Thank you for taking time to come for your Medicare Wellness Visit. I appreciate your ongoing commitment to your health goals. Please review the following plan we discussed and let me know if I can assist you in the future.   These are the goals we discussed: Goals    . DIET - INCREASE WATER INTAKE    . Have 3 meals a day     At lunch add maybe Glucerna, Smoothies with protein, Sandwiches with lean protein/veggies, etc.     . Reduce sugar intake to 25 grams per day     Trade cake and pie for fruit.         This is a list of the screening recommended for you and due dates:  Health Maintenance  Topic Date Due  .  Hepatitis C: One time screening is recommended by Center for Disease Control  (CDC) for  adults born from [birth year] through 1964.   Never done  . Flu Shot  04/02/2020  . Pneumonia vaccines (2 of 2 - PCV13) 05/05/2020*  . Hemoglobin A1C  08/25/2020  . Eye exam for diabetics  09/14/2020  . Complete foot exam   09/22/2020  . Colon Cancer Screening  02/05/2022  . Tetanus Vaccine  06/22/2025  . COVID-19 Vaccine  Completed  . DEXA scan (bone density measurement)  Addressed  *Topic was postponed. The date shown is not the original due date.    Preventive Care 49 Years and Older, Female Preventive care refers to lifestyle choices and visits with your health care provider that can promote health and wellness. This includes:  A yearly physical exam. This is also called an annual well check.  Regular dental and eye exams.  Immunizations.  Screening for certain conditions.  Healthy lifestyle  choices, such as diet and exercise. What can I expect for my preventive care visit? Physical exam Your health care provider will check:  Height and weight. These may be used to calculate body mass index (BMI), which is a measurement that tells if you are at a healthy weight.  Heart rate and blood pressure.  Your skin for abnormal spots. Counseling Your health care provider may ask you questions about:  Alcohol, tobacco, and drug use.  Emotional well-being.  Home and relationship well-being.  Sexual activity.  Eating habits.  History of falls.  Memory and ability to understand (cognition).  Work and work Statistician.  Pregnancy and menstrual history. What immunizations do I need?  Influenza (flu) vaccine  This is recommended every year. Tetanus, diphtheria, and pertussis (Tdap) vaccine  You may need a Td booster every 10 years. Varicella (chickenpox) vaccine  You may need this vaccine if you have not already been vaccinated. Zoster (shingles) vaccine  You may need this after age 92. Pneumococcal conjugate (PCV13) vaccine  One dose is recommended after age 32. Pneumococcal polysaccharide (PPSV23) vaccine  One dose is recommended after age 39. Measles, mumps, and rubella (MMR) vaccine  You may need at least one dose of MMR if you were born in 1957 or later. You may also need a second dose. Meningococcal conjugate (MenACWY) vaccine  You may need  this if you have certain conditions. Hepatitis A vaccine  You may need this if you have certain conditions or if you travel or work in places where you may be exposed to hepatitis A. Hepatitis B vaccine  You may need this if you have certain conditions or if you travel or work in places where you may be exposed to hepatitis B. Haemophilus influenzae type b (Hib) vaccine  You may need this if you have certain conditions. You may receive vaccines as individual doses or as more than one vaccine together in one shot  (combination vaccines). Talk with your health care provider about the risks and benefits of combination vaccines. What tests do I need? Blood tests  Lipid and cholesterol levels. These may be checked every 5 years, or more frequently depending on your overall health.  Hepatitis C test.  Hepatitis B test. Screening  Lung cancer screening. You may have this screening every year starting at age 31 if you have a 30-pack-year history of smoking and currently smoke or have quit within the past 15 years.  Colorectal cancer screening. All adults should have this screening starting at age 22 and continuing until age 38. Your health care provider may recommend screening at age 21 if you are at increased risk. You will have tests every 1-10 years, depending on your results and the type of screening test.  Diabetes screening. This is done by checking your blood sugar (glucose) after you have not eaten for a while (fasting). You may have this done every 1-3 years.  Mammogram. This may be done every 1-2 years. Talk with your health care provider about how often you should have regular mammograms.  BRCA-related cancer screening. This may be done if you have a family history of breast, ovarian, tubal, or peritoneal cancers. Other tests  Sexually transmitted disease (STD) testing.  Bone density scan. This is done to screen for osteoporosis. You may have this done starting at age 62. Follow these instructions at home: Eating and drinking  Eat a diet that includes fresh fruits and vegetables, whole grains, lean protein, and low-fat dairy products. Limit your intake of foods with high amounts of sugar, saturated fats, and salt.  Take vitamin and mineral supplements as recommended by your health care provider.  Do not drink alcohol if your health care provider tells you not to drink.  If you drink alcohol: ? Limit how much you have to 0-1 drink a day. ? Be aware of how much alcohol is in your drink.  In the [place] one drink equals one 12 oz bottle of beer (355 mL), one 5 oz glass of wine (148 mL), or one 1 oz glass of hard liquor (44 mL). Lifestyle  Take daily care of your teeth and gums.  Stay active. Exercise for at least 30 minutes on 5 or more days each week.  Do not use any products that contain nicotine or tobacco, such as cigarettes, e-cigarettes, and chewing tobacco. If you need help quitting, ask your health care provider.  If you are sexually active, practice safe sex. Use a condom or other form of protection in order to prevent STIs (sexually transmitted infections).  Talk with your health care provider about taking a low-dose aspirin or statin. What's next?  Go to your health care provider once a year for a well check visit.  Ask your health care provider how often you should have your eyes and teeth checked.  Stay up to date on all vaccines. This  information is not intended to replace advice given to you by your health care provider. Make sure you discuss any questions you have with your health care provider. Document Revised: 08/13/2018 Document Reviewed: 08/13/2018 Elsevier Patient Education  2020 [employer].

## 2020-04-08 DIAGNOSIS — R69 Illness, unspecified: Secondary | ICD-10-CM | POA: Diagnosis not present

## 2020-04-10 DIAGNOSIS — R69 Illness, unspecified: Secondary | ICD-10-CM | POA: Diagnosis not present

## 2020-04-26 ENCOUNTER — Telehealth: Payer: Self-pay | Admitting: *Deleted

## 2020-04-26 ENCOUNTER — Inpatient Hospital Stay: Payer: Medicare HMO | Attending: Nurse Practitioner

## 2020-04-26 ENCOUNTER — Other Ambulatory Visit: Payer: Self-pay

## 2020-04-26 DIAGNOSIS — D45 Polycythemia vera: Secondary | ICD-10-CM | POA: Diagnosis not present

## 2020-04-26 DIAGNOSIS — D471 Chronic myeloproliferative disease: Secondary | ICD-10-CM

## 2020-04-26 LAB — CBC WITH DIFFERENTIAL (CANCER CENTER ONLY)
Abs Immature Granulocytes: 0.04 10*3/uL (ref 0.00–0.07)
Basophils Absolute: 0.1 10*3/uL (ref 0.0–0.1)
Basophils Relative: 1 %
Eosinophils Absolute: 0.1 10*3/uL (ref 0.0–0.5)
Eosinophils Relative: 2 %
HCT: 34.9 % — ABNORMAL LOW (ref 36.0–46.0)
Hemoglobin: 11.6 g/dL — ABNORMAL LOW (ref 12.0–15.0)
Immature Granulocytes: 1 %
Lymphocytes Relative: 22 %
Lymphs Abs: 1.7 10*3/uL (ref 0.7–4.0)
MCH: 34.6 pg — ABNORMAL HIGH (ref 26.0–34.0)
MCHC: 33.2 g/dL (ref 30.0–36.0)
MCV: 104.2 fL — ABNORMAL HIGH (ref 80.0–100.0)
Monocytes Absolute: 0.7 10*3/uL (ref 0.1–1.0)
Monocytes Relative: 9 %
Neutro Abs: 5.2 10*3/uL (ref 1.7–7.7)
Neutrophils Relative %: 65 %
Platelet Count: 556 10*3/uL — ABNORMAL HIGH (ref 150–400)
RBC: 3.35 MIL/uL — ABNORMAL LOW (ref 3.87–5.11)
RDW: 14 % (ref 11.5–15.5)
WBC Count: 7.7 10*3/uL (ref 4.0–10.5)
nRBC: 0 % (ref 0.0–0.2)

## 2020-04-26 NOTE — Telephone Encounter (Addendum)
-----   Message from Ladell Pier, MD sent at 04/26/2020  1:55 PM EDT ----- Please call patient, hemoglobin/hematocrit remain in goal range, continue hydroxyurea at the same dose, follow-up as scheduled Notified patient of above recommendations by MD. Confirmed Hydrea dose as 500 mg daily.

## 2020-04-28 ENCOUNTER — Other Ambulatory Visit: Payer: Self-pay | Admitting: Family Medicine

## 2020-04-28 DIAGNOSIS — E785 Hyperlipidemia, unspecified: Secondary | ICD-10-CM

## 2020-05-01 ENCOUNTER — Other Ambulatory Visit: Payer: Self-pay | Admitting: Family Medicine

## 2020-05-01 DIAGNOSIS — E785 Hyperlipidemia, unspecified: Secondary | ICD-10-CM

## 2020-05-10 ENCOUNTER — Other Ambulatory Visit: Payer: Self-pay | Admitting: Oncology

## 2020-05-11 ENCOUNTER — Other Ambulatory Visit: Payer: Self-pay

## 2020-05-11 ENCOUNTER — Ambulatory Visit (INDEPENDENT_AMBULATORY_CARE_PROVIDER_SITE_OTHER): Payer: Medicare HMO | Admitting: Family Medicine

## 2020-05-11 ENCOUNTER — Encounter: Payer: Self-pay | Admitting: Family Medicine

## 2020-05-11 VITALS — BP 120/80 | HR 70 | Temp 98.0°F | Resp 18 | Ht 64.0 in | Wt 127.8 lb

## 2020-05-11 DIAGNOSIS — Z0000 Encounter for general adult medical examination without abnormal findings: Secondary | ICD-10-CM | POA: Diagnosis not present

## 2020-05-11 DIAGNOSIS — E1169 Type 2 diabetes mellitus with other specified complication: Secondary | ICD-10-CM

## 2020-05-11 DIAGNOSIS — I10 Essential (primary) hypertension: Secondary | ICD-10-CM

## 2020-05-11 DIAGNOSIS — Z1159 Encounter for screening for other viral diseases: Secondary | ICD-10-CM

## 2020-05-11 DIAGNOSIS — E785 Hyperlipidemia, unspecified: Secondary | ICD-10-CM

## 2020-05-11 NOTE — Patient Instructions (Signed)
Preventive Care 38 Years and Older, Female Preventive care refers to lifestyle choices and visits with your health care provider that can promote health and wellness. This includes:  A yearly physical exam. This is also called an annual well check.  Regular dental and eye exams.  Immunizations.  Screening for certain conditions.  Healthy lifestyle choices, such as diet and exercise. What can I expect for my preventive care visit? Physical exam Your health care provider will check:  Height and weight. These may be used to calculate body mass index (BMI), which is a measurement that tells if you are at a healthy weight.  Heart rate and blood pressure.  Your skin for abnormal spots. Counseling Your health care provider may ask you questions about:  Alcohol, tobacco, and drug use.  Emotional well-being.  Home and relationship well-being.  Sexual activity.  Eating habits.  History of falls.  Memory and ability to understand (cognition).  Work and work Statistician.  Pregnancy and menstrual history. What immunizations do I need?  Influenza (flu) vaccine  This is recommended every year. Tetanus, diphtheria, and pertussis (Tdap) vaccine  You may need a Td booster every 10 years. Varicella (chickenpox) vaccine  You may need this vaccine if you have not already been vaccinated. Zoster (shingles) vaccine  You may need this after age 33. Pneumococcal conjugate (PCV13) vaccine  One dose is recommended after age 33. Pneumococcal polysaccharide (PPSV23) vaccine  One dose is recommended after age 72. Measles, mumps, and rubella (MMR) vaccine  You may need at least one dose of MMR if you were born in 1957 or later. You may also need a second dose. Meningococcal conjugate (MenACWY) vaccine  You may need this if you have certain conditions. Hepatitis A vaccine  You may need this if you have certain conditions or if you travel or work in places where you may be exposed  to hepatitis A. Hepatitis B vaccine  You may need this if you have certain conditions or if you travel or work in places where you may be exposed to hepatitis B. Haemophilus influenzae type b (Hib) vaccine  You may need this if you have certain conditions. You may receive vaccines as individual doses or as more than one vaccine together in one shot (combination vaccines). Talk with your health care provider about the risks and benefits of combination vaccines. What tests do I need? Blood tests  Lipid and cholesterol levels. These may be checked every 5 years, or more frequently depending on your overall health.  Hepatitis C test.  Hepatitis B test. Screening  Lung cancer screening. You may have this screening every year starting at age 39 if you have a 30-pack-year history of smoking and currently smoke or have quit within the past 15 years.  Colorectal cancer screening. All adults should have this screening starting at age 36 and continuing until age 15. Your health care provider may recommend screening at age 23 if you are at increased risk. You will have tests every 1-10 years, depending on your results and the type of screening test.  Diabetes screening. This is done by checking your blood sugar (glucose) after you have not eaten for a while (fasting). You may have this done every 1-3 years.  Mammogram. This may be done every 1-2 years. Talk with your health care provider about how often you should have regular mammograms.  BRCA-related cancer screening. This may be done if you have a family history of breast, ovarian, tubal, or peritoneal cancers.  Other tests  Sexually transmitted disease (STD) testing.  Bone density scan. This is done to screen for osteoporosis. You may have this done starting at age 44. Follow these instructions at home: Eating and drinking  Eat a diet that includes fresh fruits and vegetables, whole grains, lean protein, and low-fat dairy products. Limit  your intake of foods with high amounts of sugar, saturated fats, and salt.  Take vitamin and mineral supplements as recommended by your health care provider.  Do not drink alcohol if your health care provider tells you not to drink.  If you drink alcohol: ? Limit how much you have to 0-1 drink a day. ? Be aware of how much alcohol is in your drink. In the [place] one drink equals one 12 oz bottle of beer (355 mL), one 5 oz glass of wine (148 mL), or one 1 oz glass of hard liquor (44 mL). Lifestyle  Take daily care of your teeth and gums.  Stay active. Exercise for at least 30 minutes on 5 or more days each week.  Do not use any products that contain nicotine or tobacco, such as cigarettes, e-cigarettes, and chewing tobacco. If you need help quitting, ask your health care provider.  If you are sexually active, practice safe sex. Use a condom or other form of protection in order to prevent STIs (sexually transmitted infections).  Talk with your health care provider about taking a low-dose aspirin or statin. What's next?  Go to your health care provider once a year for a well check visit.  Ask your health care provider how often you should have your eyes and teeth checked.  Stay up to date on all vaccines. This information is not intended to replace advice given to you by your health care provider. Make sure you discuss any questions you have with your health care provider. Document Revised: 08/13/2018 Document Reviewed: 08/13/2018 Elsevier Patient Education  2020 [employer].

## 2020-05-11 NOTE — Progress Notes (Signed)
Subjective:     Brandi Mccormick is a 79 y.o. female and is here for a comprehensive physical exam. The patient reports no problems.  Social History   Socioeconomic History  . Marital status: Married    Spouse name: Not on file  . Number of children: Not on file  . Years of education: Not on file  . Highest education level: Not on file  Occupational History  . Occupation: [employer]--    Comment: retired  Tobacco Use  . Smoking status: Never Smoker  . Smokeless tobacco: Never Used  Vaping Use  . Vaping Use: Never used  Substance and Sexual Activity  . Alcohol use: Yes    Alcohol/week: 0.0 standard drinks    Comment: occasional wine cooler  . Drug use: No  . Sexual activity: Never    Partners: Male  Other Topics Concern  . Not on file  Social History Narrative   Exercise-- yard work,  Walking in [place] Strain: Low Risk   . Difficulty of Paying Living Expenses: Not hard at all  Food Insecurity: No Food Insecurity  . Worried About Charity fundraiser in the Last Year: Never true  . Ran Out of Food in the Last Year: Never true  Transportation Needs: No Transportation Needs  . Lack of Transportation (Medical): No  . Lack of Transportation (Non-Medical): No  Physical Activity:   . Days of Exercise per Week: Not on file  . Minutes of Exercise per Session: Not on file  Stress:   . Feeling of Stress : Not on file  Social Connections:   . Frequency of Communication with Friends and Family: Not on file  . Frequency of Social Gatherings with Friends and Family: Not on file  . Attends Religious Services: Not on file  . Active Member of Clubs or Organizations: Not on file  . Attends Archivist Meetings: Not on file  . Marital Status: Not on file  Intimate Partner Violence:   . Fear of Current or Ex-Partner: Not on file  . Emotionally Abused: Not on file  . Physically Abused: Not on file  . Sexually Abused: Not on  file   Health Maintenance  Topic Date Due  . Hepatitis C Screening  Never done  . INFLUENZA VACCINE  11/30/2020 (Originally 04/02/2020)  . PNA vac Low Risk Adult (2 of 2 - PCV13) 05/11/2021 (Originally 11/17/2014)  . HEMOGLOBIN A1C  08/25/2020  . OPHTHALMOLOGY EXAM  09/14/2020  . FOOT EXAM  09/22/2020  . COLONOSCOPY  02/05/2022  . TETANUS/TDAP  06/22/2025  . COVID-19 Vaccine  Completed  . DEXA SCAN  Addressed    The following portions of the patient's history were reviewed and updated as appropriate:  She  has a past medical history of Diabetes ([place]), GERD (gastroesophageal reflux disease), Hyperlipidemia, Hypertension, Hyperthyroidism, Pneumonia, and Shingles. She does not have any pertinent problems on file. She  has a past surgical history that includes Dental surgery; Colonoscopy; and Cataract extraction (Left, 2020). Her family history includes Colon cancer in her mother; Diabetes in her brother, father, mother, sister, and sister; Stroke in her mother. She  reports that she has never smoked. She has never used smokeless tobacco. She reports current alcohol use. She reports that she does not use drugs. She has a current medication list which includes the following prescription(s): amlodipine, aspirin ec, famotidine, furosemide, glipizide, hydroxyurea, onetouch delica plus [id number], lisinopril, metoprolol succinate, onetouch verio,  and rosuvastatin. Current Outpatient Medications on File Prior to Visit  Medication Sig Dispense Refill  . amLODipine (NORVASC) 10 MG tablet Take 1 tablet by mouth once daily 90 tablet 0  . aspirin EC 81 MG tablet Take 81 mg by mouth daily.    . famotidine (PEPCID) 20 MG tablet Take 20 mg by mouth daily.    . furosemide (LASIX) 20 MG tablet Take 20 mg by mouth 2 (two) times daily as needed.    Marland Kitchen glipiZIDE (GLUCOTROL XL) 5 MG 24 hr tablet Take 2 tablets (10 mg total) by mouth daily. 90 tablet 2  . hydroxyurea (HYDREA) 500 MG capsule Take 1 capsule by mouth  once daily 90 capsule 0  . Lancets (ONETOUCH DELICA PLUS [id number]) MISC USE   TO CHECK GLUCOSE TWICE DAILY 100 each 11  . lisinopril (ZESTRIL) 40 MG tablet Take 1 tablet by mouth once daily 90 tablet 1  . metoprolol succinate (TOPROL-XL) 25 MG 24 hr tablet Take 1 tablet by mouth once daily 90 tablet 0  . ONETOUCH VERIO test strip USE 1 STRIP TO CHECK GLUCOSE ONCE DAILY 50 each 11  . rosuvastatin (CRESTOR) 40 MG tablet Take 1 tablet by mouth once daily 90 tablet 0   No current facility-administered medications on file prior to visit.   She is allergic to penicillins..  Review of Systems  Review of Systems  Constitutional: Negative for activity change, appetite change and fatigue.  HENT: Negative for hearing loss, congestion, tinnitus and ear discharge.   Eyes: Negative for visual disturbance (see optho q1y -- vision corrected to 20/20 with glasses).  Respiratory: Negative for cough, chest tightness and shortness of breath.   Cardiovascular: Negative for chest pain, palpitations and leg swelling.  Gastrointestinal: Negative for abdominal pain, diarrhea, constipation and abdominal distention.  Genitourinary: Negative for urgency, frequency, decreased urine volume and difficulty urinating.  Musculoskeletal: Negative for back pain, arthralgias and gait problem.  Skin: Negative for color change, pallor and rash.  Neurological: Negative for dizziness, light-headedness, numbness and headaches.  Hematological: Negative for adenopathy. Does not bruise/bleed easily.  Psychiatric/Behavioral: Negative for suicidal ideas, confusion, sleep disturbance, self-injury, dysphoric mood, decreased concentration and agitation.  Pt is able to read and write and can do all ADLs No risk for falling No abuse/ violence in home    Objective:    BP 120/80 (BP Location: Left Arm, Patient Position: Sitting, Cuff Size: Normal)   Pulse 70   Temp 98 F (36.7 C) (Oral)   Resp 18   Ht 5\' 4"  (1.626 m)   Wt 127 lb  12.8 oz (58 kg)   SpO2 96%   BMI 21.94 kg/m  General appearance: alert, cooperative, appears stated age and no distress Head: Normocephalic, without obvious abnormality, atraumatic Eyes: negative findings: lids and lashes normal, conjunctivae and sclerae normal and pupils equal, round, reactive to light and accomodation Ears: normal TM's and external ear canals both ears Neck: no adenopathy, no carotid bruit, no JVD, supple, symmetrical, trachea midline and thyroid not enlarged, symmetric, no tenderness/mass/nodules Back: symmetric, no curvature. ROM normal. No CVA tenderness. Lungs: clear to auscultation bilaterally Breasts: deferred Heart: regular rate and rhythm, S1, S2 normal, no murmur, click, rub or gallop Abdomen: soft, non-tender; bowel sounds normal; no masses,  no organomegaly Pelvic: not indicated; post-menopausal, no abnormal Pap smears in past Extremities: extremities normal, atraumatic, no cyanosis or edema Pulses: 2+ and symmetric Skin: Skin color, texture, turgor normal. No rashes or lesions Lymph nodes: Cervical, supraclavicular, and  axillary nodes normal. Neurologic: Alert and oriented X 3, normal strength and tone. Normal symmetric reflexes. Normal coordination and gait    Assessment:    Healthy female exam.      Plan:    ghm utd Check labs  See After Visit Summary for Counseling Recommendations    1. Hyperlipidemia associated with type 2 diabetes mellitus ([place]) Tolerating statin, encouraged heart healthy diet, avoid trans fats, minimize simple carbs and saturated fats. Increase exercise as tolerated - Lipid panel - Comprehensive metabolic panel  2. Essential hypertension Well controlled, no changes to meds. Encouraged heart healthy diet such as the DASH diet and exercise as tolerated.  - Lipid panel - Comprehensive metabolic panel  3. Need for hepatitis C screening test   - Hepatitis C antibody  4. Preventative health care .see above

## 2020-05-12 LAB — COMPREHENSIVE METABOLIC PANEL
AG Ratio: 1.6 (calc) (ref 1.0–2.5)
ALT: 13 U/L (ref 6–29)
AST: 15 U/L (ref 10–35)
Albumin: 3.9 g/dL (ref 3.6–5.1)
Alkaline phosphatase (APISO): 54 U/L (ref 37–153)
BUN/Creatinine Ratio: 13 (calc) (ref 6–22)
BUN: 29 mg/dL — ABNORMAL HIGH (ref 7–25)
CO2: 19 mmol/L — ABNORMAL LOW (ref 20–32)
Calcium: 9.3 mg/dL (ref 8.6–10.4)
Chloride: 110 mmol/L (ref 98–110)
Creat: 2.18 mg/dL — ABNORMAL HIGH (ref 0.60–0.93)
Globulin: 2.5 g/dL (calc) (ref 1.9–3.7)
Glucose, Bld: 141 mg/dL — ABNORMAL HIGH (ref 65–99)
Potassium: 5 mmol/L (ref 3.5–5.3)
Sodium: 139 mmol/L (ref 135–146)
Total Bilirubin: 0.3 mg/dL (ref 0.2–1.2)
Total Protein: 6.4 g/dL (ref 6.1–8.1)

## 2020-05-12 LAB — LIPID PANEL
Cholesterol: 155 mg/dL (ref <200)
HDL: 50 mg/dL (ref >=50)
LDL Cholesterol (Calc): 82 mg/dL (calc)
Non-HDL Cholesterol (Calc): 105 mg/dL (calc) (ref <130)
Total CHOL/HDL Ratio: 3.1 (calc) (ref <5.0)
Triglycerides: 126 mg/dL (ref <150)

## 2020-05-12 LAB — HEPATITIS C ANTIBODY
Hepatitis C Ab: NONREACTIVE
SIGNAL TO CUT-OFF: 0.02 (ref <1.00)

## 2020-05-22 DIAGNOSIS — R69 Illness, unspecified: Secondary | ICD-10-CM | POA: Diagnosis not present

## 2020-05-26 ENCOUNTER — Encounter: Payer: Self-pay | Admitting: Podiatry

## 2020-05-26 ENCOUNTER — Other Ambulatory Visit: Payer: Self-pay

## 2020-05-26 ENCOUNTER — Ambulatory Visit: Payer: Medicare HMO | Admitting: Podiatry

## 2020-05-26 DIAGNOSIS — M79675 Pain in left toe(s): Secondary | ICD-10-CM | POA: Diagnosis not present

## 2020-05-26 DIAGNOSIS — E119 Type 2 diabetes mellitus without complications: Secondary | ICD-10-CM

## 2020-05-26 DIAGNOSIS — L600 Ingrowing nail: Secondary | ICD-10-CM

## 2020-05-26 DIAGNOSIS — B351 Tinea unguium: Secondary | ICD-10-CM

## 2020-05-26 DIAGNOSIS — M79674 Pain in right toe(s): Secondary | ICD-10-CM | POA: Diagnosis not present

## 2020-05-26 NOTE — Patient Instructions (Signed)
EPSOM SALT FOOT SOAK INSTRUCTIONS  *IF YOU HAVE BEEN PRESCRIBED ANTIBIOTICS, TAKE AS INSTRUCTED UNTIL ALL ARE GONE*  Shopping List:  A. Plain epsom salt (not scented) B. Neosporin Cream/Ointment or Bacitracin Cream/Ointment (or prescribed antiobiotic drops/cream/ointment) C. 1-inch fabric band-aids  1.  Place 1/4 cup of epsom salts in 2 quarts of warm tap water. IF YOU ARE DIABETIC, OR HAVE NEUROPATHY, CHECK THE TEMPERATURE OF THE WATER WITH YOUR ELBOW.  2.  Submerge your foot/feet in the solution and soak for 10-15 minutes.      3.  Next, remove your foot/feet from solution, blot dry the affected area.    4.  Apply light amount of antibiotic cream/ointment and cover with fabric band-aid .  5.  This soak should be done once a day for 3 days.   6.  Monitor for any signs/symptoms of infection such as redness, swelling, odor, drainage, increased pain, or non-healing of digit.   7.  Please do not hesitate to call the office and speak to a Nurse or Doctor if you have questions.   8.  If you experience fever, chills, nightsweats, nausea or vomiting with worsening of digit, please go to the emergency room.   

## 2020-05-30 NOTE — Progress Notes (Signed)
Subjective: Brandi Mccormick presents today preventative diabetic foot care and painful mycotic nails b/l that are difficult to trim. Pain interferes with ambulation. Aggravating factors include wearing enclosed shoe gear. Pain is relieved with periodic professional debridement.   She states she did not check her blood sugar this morning. Her last A1c was 7.2 on 11/04/2019.  Patient states her left great toe is sore on today. She denies any redness, drainage or swelling.  Brandi Held, DO is patient's PCP. Last visit was: 02/24/2020.  Past Medical History:  Diagnosis Date  . Diabetes ([place])   . GERD (gastroesophageal reflux disease)   . Hyperlipidemia   . Hypertension   . Hyperthyroidism   . Pneumonia   . Shingles      Current Outpatient Medications on File Prior to Visit  Medication Sig Dispense Refill  . amLODipine (NORVASC) 10 MG tablet Take 1 tablet by mouth once daily 90 tablet 0  . aspirin EC 81 MG tablet Take 81 mg by mouth daily.    . famotidine (PEPCID) 20 MG tablet Take 20 mg by mouth daily.    . furosemide (LASIX) 20 MG tablet Take 20 mg by mouth 2 (two) times daily as needed.    Marland Kitchen glipiZIDE (GLUCOTROL XL) 5 MG 24 hr tablet Take 2 tablets (10 mg total) by mouth daily. 90 tablet 2  . hydroxyurea (HYDREA) 500 MG capsule Take 1 capsule by mouth once daily 90 capsule 0  . Lancets (ONETOUCH DELICA PLUS [id number]) MISC USE   TO CHECK GLUCOSE TWICE DAILY 100 each 11  . lisinopril (ZESTRIL) 40 MG tablet Take 1 tablet by mouth once daily 90 tablet 1  . metoprolol succinate (TOPROL-XL) 25 MG 24 hr tablet Take 1 tablet by mouth once daily 90 tablet 0  . ONETOUCH VERIO test strip USE 1 STRIP TO CHECK GLUCOSE ONCE DAILY 50 each 11  . rosuvastatin (CRESTOR) 40 MG tablet Take 1 tablet by mouth once daily 90 tablet 0   No current facility-administered medications on file prior to visit.     Allergies  Allergen Reactions  . Penicillins Nausea And Vomiting    Has patient had a  PCN reaction causing immediate rash, facial/tongue/throat swelling, SOB or lightheadedness with hypotension: no Has patient had a PCN reaction causing severe rash involving mucus membranes or skin necrosis: no Has patient had a PCN reaction that required hospitalization: no Has patient had a PCN reaction occurring within the last 10 years: no If all of the above answers are "NO", then may proceed with Cephalosporin use.     Objective: Brandi Mccormick is a pleasant 79 y.o. African American female WD, WN in NAD. AAO x 3.  There were no vitals filed for this visit.  Vascular Examination: Neurovascular status unchanged b/l lower extremities. Capillary refill time to digits immediate b/l. Palpable pedal pulses b/l LE. Pedal hair sparse. Lower extremity skin temperature gradient within normal limits. No pain with calf compression b/l. No edema noted b/l lower extremities.  Dermatological Examination: Pedal skin with normal turgor, texture and tone bilaterally. No open wounds bilaterally. No interdigital macerations bilaterally. Toenails 1-5 b/l elongated, discolored, dystrophic, thickened, crumbly with subungual debris and tenderness to dorsal palpation. Incurvated nailplate lateral border(s) L hallux.  Nail border hypertrophy absent. There is tenderness to palpation. Sign(s) of infection: no clinical signs of infection noted on examination today.  Musculoskeletal: Normal muscle strength 5/5 to all lower extremity muscle groups bilaterally. No pain crepitus or joint limitation noted  with ROM b/l. No gross bony deformities bilaterally. Patient ambulates independent of any assistive aids.  Neurological Examination: Protective sensation intact 5/5 intact bilaterally with 10g monofilament b/l. Vibratory sensation intact b/l. Proprioception intact bilaterally. Babinski reflex negative b/l. Clonus negative b/l.  Last A1c: Hemoglobin A1C Latest Ref Rng & Units 02/24/2020 11/04/2019  HGBA1C 4.0 - 5.6 %  7.2(A) 7.2(H)  Some recent data might be hidden    Assessment: 1. Pain due to onychomycosis of toenails of both feet   2. Ingrown toenail without infection   3. Plantar fibromatosis   4. Controlled type 2 diabetes mellitus without complication, without long-term current use of insulin ([place])     Plan: -Examined patient. -No new findings. No new orders. -Continue diabetic foot care principles. -Toenails 1-5 b/l were debrided in length and girth with sterile nail nippers and dremel without iatrogenic bleeding.  -Offending nail border debrided and curretaged L hallux utilizing sterile nail nipper and currette. Border(s) cleansed with alcohol and triple antibiotic ointment applied. Dispensed written instructions for once daily epsom salt soaks for 3 days. Call office if there are any problems. -Patient to report any pedal injuries to medical professional immediately. -Patient to continue soft, supportive shoe gear daily. -Patient/POA to call should there be question/concern in the interim.  Return in about 3 months (around 08/25/2020).  Brandi Mccormick, DPM

## 2020-06-13 ENCOUNTER — Other Ambulatory Visit: Payer: Self-pay | Admitting: Family Medicine

## 2020-06-29 ENCOUNTER — Encounter: Payer: Self-pay | Admitting: Internal Medicine

## 2020-06-29 ENCOUNTER — Ambulatory Visit: Payer: Medicare HMO | Admitting: Internal Medicine

## 2020-06-29 ENCOUNTER — Other Ambulatory Visit: Payer: Self-pay

## 2020-06-29 VITALS — BP 120/70 | HR 70 | Ht 64.0 in | Wt 129.2 lb

## 2020-06-29 DIAGNOSIS — R69 Illness, unspecified: Secondary | ICD-10-CM | POA: Diagnosis not present

## 2020-06-29 DIAGNOSIS — IMO0002 Reserved for concepts with insufficient information to code with codable children: Secondary | ICD-10-CM

## 2020-06-29 DIAGNOSIS — E1169 Type 2 diabetes mellitus with other specified complication: Secondary | ICD-10-CM

## 2020-06-29 DIAGNOSIS — E785 Hyperlipidemia, unspecified: Secondary | ICD-10-CM | POA: Diagnosis not present

## 2020-06-29 DIAGNOSIS — Z8639 Personal history of other endocrine, nutritional and metabolic disease: Secondary | ICD-10-CM | POA: Diagnosis not present

## 2020-06-29 DIAGNOSIS — E1151 Type 2 diabetes mellitus with diabetic peripheral angiopathy without gangrene: Secondary | ICD-10-CM

## 2020-06-29 DIAGNOSIS — E1165 Type 2 diabetes mellitus with hyperglycemia: Secondary | ICD-10-CM | POA: Diagnosis not present

## 2020-06-29 LAB — POCT GLYCOSYLATED HEMOGLOBIN (HGB A1C): Hemoglobin A1C: 7.3 % — AB (ref 4.0–5.6)

## 2020-06-29 NOTE — Patient Instructions (Addendum)
Please continue: - Glipizide XL 10 mg before breakfast   PLEASE STOP SWEET TEA AND REGULAR SODAS!  Please ask Dr. Etter Sjogren to also check a TSH.  Please return in 4 months with your sugar log.

## 2020-06-29 NOTE — Progress Notes (Signed)
Patient ID: Brandi Mccormick, female   DOB: 05-10-1941, 79 y.o.   MRN: [id number]  This visit occurred during the SARS-CoV-2 public health emergency.  Safety protocols were in place, including screening questions prior to the visit, additional usage of staff PPE, and extensive cleaning of exam room while observing appropriate contact time as indicated for disinfecting solutions.   HPI: Brandi Mccormick is a 79 y.o.-year-old female, presenting for f/u for DM2, dx 2013, non-insulin-dependent, uncontrolled, with complications (microalbuminuria) and h/o hyperthyroidism. Last visit 4 months ago.  DM2: Reviewed HbA1c levels: Lab Results  Component Value Date   HGBA1C 7.2 (A) 02/24/2020   HGBA1C 7.2 (H) 11/04/2019   HGBA1C 6.6 (H) 05/06/2019   HGBA1C 6.1 12/21/2018   HGBA1C 8.3 (H) 09/11/2018   HGBA1C 6.9 (A) 06/04/2018   HGBA1C 7.6 (A) 02/02/2018   HGBA1C 7.4 10/03/2017   HGBA1C 7.0 (H) 04/18/2017   HGBA1C 6.9 02/14/2017   HGBA1C 7.3 11/15/2016   HGBA1C 6.6 06/28/2016   HGBA1C 7.1 03/28/2016   HGBA1C 7.5 12/29/2015   HGBA1C 6.9 08/30/2015   Pt is on a regimen of: - Glipizide XL 5 mg in a.m.  >> 10 mg in a.m. We tried Tradjenta 5 mg daily in am - too expensive. She was previously on Janumet XR and Metformin ER, but this was stopped 05/2019 due to declining kidney function.  Pt checks her sugars once a day: - am:  136-168 >> 85-152 >> 72-129, 135 - 2h after b'fast: 132 >> n/c >> 127-155 >> 124 - before lunch: 101, 205 >> 123-148 >> 100-152 - 2h after lunch: 101, 142, 145, 207, 209 >> 133-153 >> 128-152 - before dinner: 131, 138, 238, 239 >> 88, 117-140 >> 89, 140-145 - 2h after dinner: 75-146 >> 117 >> 141, 152 >> 114-152 - bedtime: 123, 154 >> 69, 149 >> 137-169 >> 153 >> 145 - nighttime: n/c >> 161 >> n/c Lowest sugar: 64 (delayed lunch) >> 101 >> 72; it is unclear at which level she has hypoglycemia awareness. Highest sugar: 152 >> 239 >> 155 >> 152.  Meter: OneTouch  Verio.  Pt's meals are: - Breakfast: egg, coffee, juice; sometime - Lunch: No Lunch - Dinner: meat + vegetables >> late dinner during the weekend as her son is cooking and brings her food - Snacks: 1-2 She restarted to drink regular sodas and sweet tea.  -+ CKD (FSGS-sees nephrology), last BUN/creatinine:  Lab Results  Component Value Date   BUN 29 (H) 05/11/2020   CREATININE 2.18 (H) 05/11/2020   Lab Results  Component Value Date   GFRAA 24 (L) 06/10/2019   GFRAA 27 (L) 04/28/2019   GFRAA 43 (L) 07/27/2018   GFRAA >60 06/26/2018   GFRAA 38 (L) 06/02/2018   GFRAA 82 06/28/2016   GFRAA >90 06/25/2014   GFRAA >90 06/24/2014   GFRAA >90 10/23/2013  12/21/2018: GFR 28.9   She has MAU: Lab Results  Component Value Date   MICRALBCREAT 43.8 (H) 11/04/2019   MICRALBCREAT 77.6 (H) 05/06/2019   MICRALBCREAT 422.7 (H) 04/02/2018   MICRALBCREAT 225.8 (H) 11/15/2016   MICRALBCREAT 48.8 (H) 06/28/2016   MICRALBCREAT 9.9 02/21/2014   MICRALBCREAT 8.8 11/16/2013  On lisinopril 40.  -+ HL; last set of lipids: Lab Results  Component Value Date   CHOL 155 05/11/2020   HDL 50 05/11/2020   LDLCALC 82 05/11/2020   TRIG 126 05/11/2020   CHOLHDL 3.1 05/11/2020  Prev. on Simvastatin >> off in 06/2016 b/c fatigue.  She started pravastatin 80 mg daily in 12/2016 >> currently on Zetia and on Crestor 40.  - no numbness and tingling in her feet. She has onychomycosis..  - Last eye exam in 2021: No DR reportedly; Had OS cataract sx.   H/o hyperthyroidism - previously treated with medication, more than 20 years ago. No other history available.  Pt denies: - feeling nodules in neck - hoarseness - dysphagia - choking - SOB with lying down  Her TFTs were normal: Lab Results  Component Value Date   TSH 1.12 05/12/2019   TSH 0.635 06/26/2018   TSH 1.11 04/02/2018   TSH 1.13 07/03/2017   TSH 1.12 06/28/2016   TSH 1.29 06/23/2015   TSH 1.44 10/18/2014   TSH 0.93 08/15/2014   TSH  0.44 02/17/2014   TSH 0.28 (L) 12/20/2013   FREET4 0.85 05/12/2019   FREET4 0.93 07/03/2017   FREET4 0.96 06/28/2016   FREET4 0.91 08/15/2014   FREET4 1.00 02/17/2014   FREET4 0.97 12/20/2013   T3FREE 2.7 05/12/2019   T3FREE 3.2 07/03/2017   T3FREE 3.1 06/28/2016   T3FREE 3.2 08/15/2014   T3FREE 2.6 02/17/2014   T3FREE 2.9 12/20/2013   ROS: Constitutional: no weight gain/no weight loss, no fatigue, no subjective hyperthermia, no subjective hypothermia Eyes: no blurry vision, no xerophthalmia ENT: no sore throat, no nodules palpated in neck, no dysphagia, no odynophagia, no hoarseness Cardiovascular: no CP/no SOB/no palpitations/no leg swelling Respiratory: no cough/no SOB/no wheezing Gastrointestinal: no N/no V/no D/no C/no acid reflux Musculoskeletal: no muscle aches/no joint aches Skin: no rashes, no hair loss Neurological: no tremors/no numbness/no tingling/no dizziness  I reviewed pt's medications, allergies, PMH, social hx, family hx, and changes were documented in the history of present illness. Otherwise, unchanged from my initial visit note.  Past Medical History:  Diagnosis Date  . Diabetes ([place])   . GERD (gastroesophageal reflux disease)   . Hyperlipidemia   . Hypertension   . Hyperthyroidism   . Pneumonia   . Shingles    Past Surgical History:  Procedure Laterality Date  . CATARACT EXTRACTION Left 2020  . COLONOSCOPY    . DENTAL SURGERY     Social History   Socioeconomic History  . Marital status: Married    Spouse name: Not on file  . Number of children: Not on file  . Years of education: Not on file  . Highest education level: Not on file  Occupational History  . Occupation: [employer]--    Comment: retired  Tobacco Use  . Smoking status: Never Smoker  . Smokeless tobacco: Never Used  Vaping Use  . Vaping Use: Never used  Substance and Sexual Activity  . Alcohol use: Yes    Alcohol/week: 0.0 standard drinks    Comment: occasional wine cooler  .  Drug use: No  . Sexual activity: Never    Partners: Male  Other Topics Concern  . Not on file  Social History Narrative   Exercise-- yard work,  Walking in [place] Strain: Low Risk   . Difficulty of Paying Living Expenses: Not hard at all  Food Insecurity: No Food Insecurity  . Worried About Charity fundraiser in the Last Year: Never true  . Ran Out of Food in the Last Year: Never true  Transportation Needs: No Transportation Needs  . Lack of Transportation (Medical): No  . Lack of Transportation (Non-Medical): No  Physical Activity:   . Days of Exercise  per Week: Not on file  . Minutes of Exercise per Session: Not on file  Stress:   . Feeling of Stress : Not on file  Social Connections:   . Frequency of Communication with Friends and Family: Not on file  . Frequency of Social Gatherings with Friends and Family: Not on file  . Attends Religious Services: Not on file  . Active Member of Clubs or Organizations: Not on file  . Attends Archivist Meetings: Not on file  . Marital Status: Not on file  Intimate Partner Violence:   . Fear of Current or Ex-Partner: Not on file  . Emotionally Abused: Not on file  . Physically Abused: Not on file  . Sexually Abused: Not on file   Current Outpatient Medications on File Prior to Visit  Medication Sig Dispense Refill  . amLODipine (NORVASC) 10 MG tablet Take 1 tablet by mouth once daily 90 tablet 0  . aspirin EC 81 MG tablet Take 81 mg by mouth daily.    . famotidine (PEPCID) 20 MG tablet Take 20 mg by mouth daily.    . furosemide (LASIX) 20 MG tablet Take 20 mg by mouth 2 (two) times daily as needed.    Marland Kitchen glipiZIDE (GLUCOTROL XL) 5 MG 24 hr tablet Take 2 tablets (10 mg total) by mouth daily. 90 tablet 2  . hydroxyurea (HYDREA) 500 MG capsule Take 1 capsule by mouth once daily 90 capsule 0  . Lancets (ONETOUCH DELICA PLUS [id number]) MISC USE   TO CHECK GLUCOSE TWICE DAILY  100 each 11  . lisinopril (ZESTRIL) 40 MG tablet Take 1 tablet by mouth once daily 90 tablet 1  . metoprolol succinate (TOPROL-XL) 25 MG 24 hr tablet Take 1 tablet by mouth once daily 90 tablet 0  . ONETOUCH VERIO test strip USE 1 STRIP TO CHECK GLUCOSE ONCE DAILY 50 each 11  . rosuvastatin (CRESTOR) 40 MG tablet Take 1 tablet by mouth once daily 90 tablet 0   No current facility-administered medications on file prior to visit.   Allergies  Allergen Reactions  . Penicillins Nausea And Vomiting    Has patient had a PCN reaction causing immediate rash, facial/tongue/throat swelling, SOB or lightheadedness with hypotension: no Has patient had a PCN reaction causing severe rash involving mucus membranes or skin necrosis: no Has patient had a PCN reaction that required hospitalization: no Has patient had a PCN reaction occurring within the last 10 years: no If all of the above answers are "NO", then may proceed with Cephalosporin use.    Family History  Problem Relation Age of Onset  . Diabetes Sister   . Diabetes Brother   . Diabetes Sister   . Diabetes Mother        and on mother's side of family  . Colon cancer Mother   . Stroke Mother   . Diabetes Father        and on father's side of family  . Heart disease Neg Hx   . Rectal cancer Neg Hx   . Stomach cancer Neg Hx     PE: BP 120/70   Pulse 70   Ht 5\' 4"  (1.626 m)   Wt 129 lb 3.2 oz (58.6 kg)   SpO2 99%   BMI 22.18 kg/m  Body mass index is 22.18 kg/m. Wt Readings from Last 3 Encounters:  06/29/20 129 lb 3.2 oz (58.6 kg)  05/11/20 127 lb 12.8 oz (58 kg)  04/06/20 125 lb 12.8 oz (57.1 kg)  Constitutional: normal weight, in NAD Eyes: PERRLA, EOMI, no exophthalmos ENT: moist mucous membranes, no thyromegaly, no cervical lymphadenopathy Cardiovascular: RRR, No MRG Respiratory: CTA B Gastrointestinal: abdomen soft, NT, ND, BS+ Musculoskeletal: no deformities, strength intact in all 4 Skin: moist, warm, no  rashes Neurological: no tremor with outstretched hands, DTR normal in all 4  ASSESSMENT: 1. DM2, non-insulin-dependent, uncontrolled, with complications - MAU  2. H/o Hyperthyroidism - 20 years ago - was on meds, cannot remember name  3. HL  PLAN:  1. Patient with history of uncontrolled diabetes with improved control in the last few years, only on oral antidiabetic regimen. We had to stop Metformin ER in the past due to declining kidney function. We are limited in her medication choices due to her decreased kidney function. 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 Johnney Ou. She is not on prednisone. She is also on hydroxyurea for chronic myeloproliferative disease. -At last visit, sugars were quite stable, and improved from the previous visit. She had no sugars in the 200s and no lows under 80s. Overall, she was doing very well only on glipizide so we did not change the regimen. We did discuss about the GLP-1 receptor agonist or insulin but I did not feel at that time that we had to escalate her regimen. HbA1c was 7.2%, which is at goal for her age. -At today's visit, most sugars are still at goal, usually excellent in the morning, but they may be above goal before lunch and dinner.  Upon questioning, she restarted to drink regular sodas and sweet tea and I believe that these are definitely raising her blood sugars.  We discussed about the absolute importance of stopping these!  Otherwise, I think we can continue the same regimen for now.  Of note, she does not have any low blood sugars from the sulfonylurea. - I suggested to:  Patient Instructions  Please continue: - Glipizide XL 10 mg before breakfast   Please return in 4 months with your sugar log.   - we checked her HbA1c: 7.3% (slightly higher) - advised to check sugars at different times of the day - 1x a day, rotating check times - advised for yearly eye exams >> she is UTD - return to clinic in 4 months  2. H/o hyperthyroidism -No thyrotoxic  complaints -Reviewed latest TSH is normal: Lab Results  Component Value Date   TSH 1.12 05/12/2019  -She has an annual physical exam with PCP next month  3. HL -Reviewed the latest lipid panel from last month: LDL slightly above her goal of less than 70 due to her CKD, the rest of the fractions are at goal: Lab Results  Component Value Date   CHOL 155 05/11/2020   HDL 50 05/11/2020   LDLCALC 82 05/11/2020   TRIG 126 05/11/2020   CHOLHDL 3.1 05/11/2020  -Continues high-dose, 40 mg, Crestor without side effects  Philemon Kingdom, MD PhD [facility]

## 2020-07-03 ENCOUNTER — Other Ambulatory Visit: Payer: Self-pay

## 2020-07-03 MED ORDER — GLIPIZIDE ER 5 MG PO TB24
10.0000 mg | ORAL_TABLET | Freq: Every day | ORAL | 2 refills | Status: DC
Start: 2020-07-03 — End: 2020-12-22

## 2020-07-07 ENCOUNTER — Other Ambulatory Visit: Payer: Self-pay | Admitting: Family Medicine

## 2020-07-07 DIAGNOSIS — I10 Essential (primary) hypertension: Secondary | ICD-10-CM

## 2020-07-24 DIAGNOSIS — I129 Hypertensive chronic kidney disease with stage 1 through stage 4 chronic kidney disease, or unspecified chronic kidney disease: Secondary | ICD-10-CM | POA: Diagnosis not present

## 2020-07-24 DIAGNOSIS — N184 Chronic kidney disease, stage 4 (severe): Secondary | ICD-10-CM | POA: Diagnosis not present

## 2020-07-24 DIAGNOSIS — N051 Unspecified nephritic syndrome with focal and segmental glomerular lesions: Secondary | ICD-10-CM | POA: Diagnosis not present

## 2020-07-24 DIAGNOSIS — E1122 Type 2 diabetes mellitus with diabetic chronic kidney disease: Secondary | ICD-10-CM | POA: Diagnosis not present

## 2020-07-24 DIAGNOSIS — E785 Hyperlipidemia, unspecified: Secondary | ICD-10-CM | POA: Diagnosis not present

## 2020-07-24 DIAGNOSIS — D75839 Thrombocytosis, unspecified: Secondary | ICD-10-CM | POA: Diagnosis not present

## 2020-07-26 ENCOUNTER — Inpatient Hospital Stay: Payer: Medicare HMO

## 2020-07-26 ENCOUNTER — Inpatient Hospital Stay: Payer: Medicare HMO | Attending: Nurse Practitioner | Admitting: Nurse Practitioner

## 2020-07-26 ENCOUNTER — Telehealth: Payer: Self-pay | Admitting: Nurse Practitioner

## 2020-07-26 ENCOUNTER — Other Ambulatory Visit: Payer: Self-pay

## 2020-07-26 ENCOUNTER — Encounter: Payer: Self-pay | Admitting: Nurse Practitioner

## 2020-07-26 VITALS — BP 122/95 | HR 71 | Temp 97.5°F | Resp 16 | Ht 64.0 in | Wt 127.6 lb

## 2020-07-26 DIAGNOSIS — D471 Chronic myeloproliferative disease: Secondary | ICD-10-CM

## 2020-07-26 DIAGNOSIS — E119 Type 2 diabetes mellitus without complications: Secondary | ICD-10-CM | POA: Diagnosis not present

## 2020-07-26 DIAGNOSIS — D45 Polycythemia vera: Secondary | ICD-10-CM | POA: Diagnosis not present

## 2020-07-26 DIAGNOSIS — Z79899 Other long term (current) drug therapy: Secondary | ICD-10-CM | POA: Diagnosis not present

## 2020-07-26 DIAGNOSIS — I10 Essential (primary) hypertension: Secondary | ICD-10-CM | POA: Diagnosis not present

## 2020-07-26 LAB — CBC WITH DIFFERENTIAL (CANCER CENTER ONLY)
Abs Immature Granulocytes: 0.03 10*3/uL (ref 0.00–0.07)
Basophils Absolute: 0 10*3/uL (ref 0.0–0.1)
Basophils Relative: 1 %
Eosinophils Absolute: 0.2 10*3/uL (ref 0.0–0.5)
Eosinophils Relative: 3 %
HCT: 39.7 % (ref 36.0–46.0)
Hemoglobin: 13 g/dL (ref 12.0–15.0)
Immature Granulocytes: 0 %
Lymphocytes Relative: 24 %
Lymphs Abs: 1.7 10*3/uL (ref 0.7–4.0)
MCH: 33.1 pg (ref 26.0–34.0)
MCHC: 32.7 g/dL (ref 30.0–36.0)
MCV: 101 fL — ABNORMAL HIGH (ref 80.0–100.0)
Monocytes Absolute: 0.5 10*3/uL (ref 0.1–1.0)
Monocytes Relative: 7 %
Neutro Abs: 4.8 10*3/uL (ref 1.7–7.7)
Neutrophils Relative %: 65 %
Platelet Count: 606 10*3/uL — ABNORMAL HIGH (ref 150–400)
RBC: 3.93 MIL/uL (ref 3.87–5.11)
RDW: 13.9 % (ref 11.5–15.5)
WBC Count: 7.3 10*3/uL (ref 4.0–10.5)
nRBC: 0 % (ref 0.0–0.2)

## 2020-07-26 NOTE — Telephone Encounter (Signed)
Scheduled per los. Gave avs and calendar  

## 2020-07-26 NOTE — Progress Notes (Signed)
  [facility] OFFICE PROGRESS NOTE   Diagnosis: Polycythemia vera  INTERVAL HISTORY:   Ms. Iwata returns as scheduled.  She continues hydroxyurea 500 mg daily.  She feels well.  No nausea or vomiting.  No mouth sores.  No diarrhea.  No skin rash.  She denies pain.  No symptoms of thrombosis.  No bleeding.  No fever or sweats.  Objective:  Vital signs in last 24 hours:  Blood pressure (!) 122/95, pulse 71, temperature (!) 97.5 F (36.4 C), temperature source Tympanic, resp. rate 16, height 5\' 4"  (1.626 m), weight 127 lb 9.6 oz (57.9 kg), SpO2 98 %.    HEENT: No thrush or ulcers. Resp: Lungs clear bilaterally. Cardio: Regular rate and rhythm. GI: No hepatosplenomegaly. Vascular: No leg edema.  Skin: No rash.   Lab Results:  Lab Results  Component Value Date   WBC 7.3 07/26/2020   HGB 13.0 07/26/2020   HCT 39.7 07/26/2020   MCV 101.0 (H) 07/26/2020   PLT 606 (H) 07/26/2020   NEUTROABS 4.8 07/26/2020    Imaging:  No results found.  Medications: I have reviewed the patient's current medications.  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 Type 2 diabetes 3. Hypertension 4. Focal/segmental glomerular sclerosis  Disposition: Ms. Fraiser appears stable.  We reviewed the CBC from today.  Hemoglobin/hematocrit and platelets slightly higher.  She will continue hydroxyurea 500 mg daily and return for a repeat CBC in 6 weeks.  We scheduled a 33-month office visit.  Plan reviewed with Dr. Benay Spice.    Ned Card ANP/GNP-BC   07/26/2020  9:37 AM

## 2020-07-29 ENCOUNTER — Other Ambulatory Visit: Payer: Self-pay | Admitting: Family Medicine

## 2020-07-29 DIAGNOSIS — E785 Hyperlipidemia, unspecified: Secondary | ICD-10-CM

## 2020-08-07 DIAGNOSIS — R69 Illness, unspecified: Secondary | ICD-10-CM | POA: Diagnosis not present

## 2020-08-08 DIAGNOSIS — R69 Illness, unspecified: Secondary | ICD-10-CM | POA: Diagnosis not present

## 2020-08-30 ENCOUNTER — Ambulatory Visit: Payer: Medicare HMO | Admitting: Podiatry

## 2020-08-30 ENCOUNTER — Encounter: Payer: Self-pay | Admitting: Podiatry

## 2020-08-30 ENCOUNTER — Other Ambulatory Visit: Payer: Self-pay

## 2020-08-30 DIAGNOSIS — M79675 Pain in left toe(s): Secondary | ICD-10-CM

## 2020-08-30 DIAGNOSIS — B351 Tinea unguium: Secondary | ICD-10-CM

## 2020-08-30 DIAGNOSIS — E119 Type 2 diabetes mellitus without complications: Secondary | ICD-10-CM

## 2020-08-30 DIAGNOSIS — M79674 Pain in right toe(s): Secondary | ICD-10-CM | POA: Diagnosis not present

## 2020-09-01 NOTE — Progress Notes (Signed)
Subjective: Brandi Mccormick presents today preventative diabetic foot care and painful mycotic nails b/l that are difficult to trim. Pain interferes with ambulation. Aggravating factors include wearing enclosed shoe gear. Pain is relieved with periodic professional debridement.   She states she did not check her blood sugar this morning. She states her blood sugar was 118 mg/dl yesterday.   She voices no new problems on today's visit.  [name], DO is patient's PCP. Last visit was: 05/11/2020.  Past Medical History:  Diagnosis Date  . Diabetes ([place])   . GERD (gastroesophageal reflux disease)   . Hyperlipidemia   . Hypertension   . Hyperthyroidism   . Pneumonia   . Shingles      Current Outpatient Medications on File Prior to Visit  Medication Sig Dispense Refill  . amLODipine (NORVASC) 10 MG tablet Take 1 tablet by mouth once daily 90 tablet 0  . aspirin EC 81 MG tablet Take 81 mg by mouth daily.    . famotidine (PEPCID) 20 MG tablet Take 20 mg by mouth daily.    . furosemide (LASIX) 20 MG tablet Take 20 mg by mouth 2 (two) times daily as needed.    Marland Kitchen glipiZIDE (GLUCOTROL XL) 5 MG 24 hr tablet Take 2 tablets (10 mg total) by mouth daily. 90 tablet 2  . hydroxyurea (HYDREA) 500 MG capsule Take 1 capsule by mouth once daily 90 capsule 0  . Lancets (ONETOUCH DELICA PLUS [id number]) MISC USE   TO CHECK GLUCOSE TWICE DAILY 100 each 11  . lisinopril (ZESTRIL) 40 MG tablet Take 1 tablet (40 mg total) by mouth daily. 90 tablet 1  . metoprolol succinate (TOPROL-XL) 25 MG 24 hr tablet Take 1 tablet by mouth once daily 90 tablet 0  . ONETOUCH VERIO test strip USE 1 STRIP TO CHECK GLUCOSE ONCE DAILY 50 each 11  . rosuvastatin (CRESTOR) 40 MG tablet Take 1 tablet by mouth once daily 90 tablet 0   No current facility-administered medications on file prior to visit.     Allergies  Allergen Reactions  . Penicillins Nausea And Vomiting    Has patient had a PCN reaction causing  immediate rash, facial/tongue/throat swelling, SOB or lightheadedness with hypotension: no Has patient had a PCN reaction causing severe rash involving mucus membranes or skin necrosis: no Has patient had a PCN reaction that required hospitalization: no Has patient had a PCN reaction occurring within the last 10 years: no If all of the above answers are "NO", then may proceed with Cephalosporin use.     Objective: Brandi Mccormick is a pleasant 79 y.o. African American female WD, WN in NAD. AAO x 3.  There were no vitals filed for this visit.  Vascular Examination: Neurovascular status unchanged b/l lower extremities. Capillary refill time to digits immediate b/l. Palpable pedal pulses b/l LE. Pedal hair sparse. Lower extremity skin temperature gradient within normal limits. No pain with calf compression b/l. No edema noted b/l lower extremities.  Dermatological Examination: Pedal skin with normal turgor, texture and tone bilaterally. No open wounds bilaterally. No interdigital macerations bilaterally. Toenails 1-5 b/l elongated, discolored, dystrophic, thickened, crumbly with subungual debris and tenderness to dorsal palpation.   Musculoskeletal: Normal muscle strength 5/5 to all lower extremity muscle groups bilaterally. No pain crepitus or joint limitation noted with ROM b/l. No gross bony deformities bilaterally. Patient ambulates independent of any assistive aids.  Neurological Examination: Protective sensation intact 5/5 intact bilaterally with 10g monofilament b/l. Vibratory sensation intact  b/l. Proprioception intact bilaterally. Babinski reflex negative b/l. Clonus negative b/l.  Last A1c: Hemoglobin A1C Latest Ref Rng & Units 06/29/2020 02/24/2020 11/04/2019  HGBA1C 4.0 - 5.6 % 7.3(A) 7.2(A) 7.2(H)  Some recent data might be hidden    Assessment: 1. Pain due to onychomycosis of toenails of both feet   2. Controlled type 2 diabetes mellitus without complication, without  long-term current use of insulin ([place])     Plan: -Examined patient. -No new findings. No new orders. -Continue diabetic foot care principles. -Toenails 1-5 b/l were debrided in length and girth with sterile nail nippers and dremel without iatrogenic bleeding.  -Patient to report any pedal injuries to medical professional immediately. -Patient to continue soft, supportive shoe gear daily. -Patient/POA to call should there be question/concern in the interim.  Return in about 3 months (around 11/28/2020).  Marzetta Board, DPM

## 2020-09-06 ENCOUNTER — Other Ambulatory Visit: Payer: Self-pay

## 2020-09-06 ENCOUNTER — Telehealth: Payer: Self-pay

## 2020-09-06 ENCOUNTER — Inpatient Hospital Stay: Payer: Medicare HMO | Attending: Nurse Practitioner

## 2020-09-06 DIAGNOSIS — D45 Polycythemia vera: Secondary | ICD-10-CM | POA: Diagnosis not present

## 2020-09-06 DIAGNOSIS — D471 Chronic myeloproliferative disease: Secondary | ICD-10-CM

## 2020-09-06 LAB — CBC WITH DIFFERENTIAL (CANCER CENTER ONLY)
Abs Immature Granulocytes: 0.03 10*3/uL (ref 0.00–0.07)
Basophils Absolute: 0 10*3/uL (ref 0.0–0.1)
Basophils Relative: 0 %
Eosinophils Absolute: 0.1 10*3/uL (ref 0.0–0.5)
Eosinophils Relative: 2 %
HCT: 38.8 % (ref 36.0–46.0)
Hemoglobin: 12.9 g/dL (ref 12.0–15.0)
Immature Granulocytes: 1 %
Lymphocytes Relative: 21 %
Lymphs Abs: 1.3 10*3/uL (ref 0.7–4.0)
MCH: 32.7 pg (ref 26.0–34.0)
MCHC: 33.2 g/dL (ref 30.0–36.0)
MCV: 98.2 fL (ref 80.0–100.0)
Monocytes Absolute: 0.5 10*3/uL (ref 0.1–1.0)
Monocytes Relative: 8 %
Neutro Abs: 4.4 10*3/uL (ref 1.7–7.7)
Neutrophils Relative %: 68 %
Platelet Count: 437 10*3/uL — ABNORMAL HIGH (ref 150–400)
RBC: 3.95 MIL/uL (ref 3.87–5.11)
RDW: 14.6 % (ref 11.5–15.5)
WBC Count: 6.4 10*3/uL (ref 4.0–10.5)
nRBC: 0 % (ref 0.0–0.2)

## 2020-09-06 NOTE — Telephone Encounter (Signed)
-----   Message from Owens Shark, NP sent at 09/06/2020  3:37 PM EST ----- Please let her know to continue same dose of Hydrea.  Follow-up as scheduled.

## 2020-09-06 NOTE — Telephone Encounter (Signed)
Called and spoke with pt made aware of provider instruction to continue same dose of hydrea

## 2020-09-21 ENCOUNTER — Other Ambulatory Visit: Payer: Self-pay | Admitting: Family Medicine

## 2020-09-21 ENCOUNTER — Other Ambulatory Visit: Payer: Self-pay | Admitting: Oncology

## 2020-10-03 DIAGNOSIS — I639 Cerebral infarction, unspecified: Secondary | ICD-10-CM

## 2020-10-03 HISTORY — DX: Cerebral infarction, unspecified: I63.9

## 2020-10-06 ENCOUNTER — Inpatient Hospital Stay (HOSPITAL_COMMUNITY)
Admission: EM | Admit: 2020-10-06 | Discharge: 2020-10-09 | DRG: 065 | Disposition: A | Discharge status: Home / Self Care | Payer: Medicare HMO | Attending: Internal Medicine | Admitting: Internal Medicine

## 2020-10-06 ENCOUNTER — Observation Stay (HOSPITAL_COMMUNITY): Payer: Medicare HMO

## 2020-10-06 ENCOUNTER — Encounter (HOSPITAL_COMMUNITY): Payer: Self-pay | Admitting: Emergency Medicine

## 2020-10-06 ENCOUNTER — Other Ambulatory Visit: Payer: Self-pay

## 2020-10-06 ENCOUNTER — Ambulatory Visit (HOSPITAL_COMMUNITY)
Admission: EM | Admit: 2020-10-06 | Discharge: 2020-10-06 | Disposition: A | Discharge status: Home / Self Care | Payer: Medicare HMO | Attending: Emergency Medicine | Admitting: Emergency Medicine

## 2020-10-06 ENCOUNTER — Encounter (HOSPITAL_COMMUNITY): Payer: Self-pay

## 2020-10-06 ENCOUNTER — Emergency Department (HOSPITAL_COMMUNITY): Payer: Medicare HMO

## 2020-10-06 DIAGNOSIS — I6322 Cerebral infarction due to unspecified occlusion or stenosis of basilar arteries: Principal | ICD-10-CM | POA: Diagnosis present

## 2020-10-06 DIAGNOSIS — D45 Polycythemia vera: Secondary | ICD-10-CM | POA: Diagnosis present

## 2020-10-06 DIAGNOSIS — Z823 Family history of stroke: Secondary | ICD-10-CM

## 2020-10-06 DIAGNOSIS — I639 Cerebral infarction, unspecified: Secondary | ICD-10-CM | POA: Diagnosis not present

## 2020-10-06 DIAGNOSIS — E785 Hyperlipidemia, unspecified: Secondary | ICD-10-CM | POA: Diagnosis not present

## 2020-10-06 DIAGNOSIS — E1151 Type 2 diabetes mellitus with diabetic peripheral angiopathy without gangrene: Secondary | ICD-10-CM | POA: Diagnosis not present

## 2020-10-06 DIAGNOSIS — I6521 Occlusion and stenosis of right carotid artery: Secondary | ICD-10-CM | POA: Diagnosis not present

## 2020-10-06 DIAGNOSIS — Z833 Family history of diabetes mellitus: Secondary | ICD-10-CM

## 2020-10-06 DIAGNOSIS — IMO0002 Reserved for concepts with insufficient information to code with codable children: Secondary | ICD-10-CM | POA: Diagnosis present

## 2020-10-06 DIAGNOSIS — I10 Essential (primary) hypertension: Secondary | ICD-10-CM | POA: Diagnosis not present

## 2020-10-06 DIAGNOSIS — R42 Dizziness and giddiness: Secondary | ICD-10-CM

## 2020-10-06 DIAGNOSIS — I6389 Other cerebral infarction: Secondary | ICD-10-CM

## 2020-10-06 DIAGNOSIS — Z7984 Long term (current) use of oral hypoglycemic drugs: Secondary | ICD-10-CM

## 2020-10-06 DIAGNOSIS — K219 Gastro-esophageal reflux disease without esophagitis: Secondary | ICD-10-CM | POA: Diagnosis present

## 2020-10-06 DIAGNOSIS — Z8673 Personal history of transient ischemic attack (TIA), and cerebral infarction without residual deficits: Secondary | ICD-10-CM | POA: Diagnosis present

## 2020-10-06 DIAGNOSIS — R2689 Other abnormalities of gait and mobility: Secondary | ICD-10-CM | POA: Diagnosis not present

## 2020-10-06 DIAGNOSIS — E1122 Type 2 diabetes mellitus with diabetic chronic kidney disease: Secondary | ICD-10-CM | POA: Diagnosis present

## 2020-10-06 DIAGNOSIS — N179 Acute kidney failure, unspecified: Secondary | ICD-10-CM | POA: Diagnosis present

## 2020-10-06 DIAGNOSIS — N1832 Chronic kidney disease, stage 3b: Secondary | ICD-10-CM | POA: Diagnosis not present

## 2020-10-06 DIAGNOSIS — E1165 Type 2 diabetes mellitus with hyperglycemia: Secondary | ICD-10-CM

## 2020-10-06 DIAGNOSIS — R29700 NIHSS score 0: Secondary | ICD-10-CM | POA: Diagnosis present

## 2020-10-06 DIAGNOSIS — I129 Hypertensive chronic kidney disease with stage 1 through stage 4 chronic kidney disease, or unspecified chronic kidney disease: Secondary | ICD-10-CM | POA: Diagnosis present

## 2020-10-06 DIAGNOSIS — I6503 Occlusion and stenosis of bilateral vertebral arteries: Secondary | ICD-10-CM | POA: Diagnosis present

## 2020-10-06 DIAGNOSIS — Z20822 Contact with and (suspected) exposure to covid-19: Secondary | ICD-10-CM | POA: Diagnosis present

## 2020-10-06 DIAGNOSIS — R590 Localized enlarged lymph nodes: Secondary | ICD-10-CM | POA: Diagnosis present

## 2020-10-06 DIAGNOSIS — R519 Headache, unspecified: Secondary | ICD-10-CM

## 2020-10-06 DIAGNOSIS — E1159 Type 2 diabetes mellitus with other circulatory complications: Secondary | ICD-10-CM | POA: Diagnosis present

## 2020-10-06 DIAGNOSIS — Z7982 Long term (current) use of aspirin: Secondary | ICD-10-CM

## 2020-10-06 DIAGNOSIS — E7800 Pure hypercholesterolemia, unspecified: Secondary | ICD-10-CM | POA: Diagnosis not present

## 2020-10-06 DIAGNOSIS — Z880 Allergy status to penicillin: Secondary | ICD-10-CM

## 2020-10-06 DIAGNOSIS — Z79899 Other long term (current) drug therapy: Secondary | ICD-10-CM

## 2020-10-06 DIAGNOSIS — R29818 Other symptoms and signs involving the nervous system: Secondary | ICD-10-CM | POA: Diagnosis not present

## 2020-10-06 DIAGNOSIS — R739 Hyperglycemia, unspecified: Secondary | ICD-10-CM | POA: Diagnosis not present

## 2020-10-06 DIAGNOSIS — N184 Chronic kidney disease, stage 4 (severe): Secondary | ICD-10-CM | POA: Diagnosis present

## 2020-10-06 DIAGNOSIS — R21 Rash and other nonspecific skin eruption: Secondary | ICD-10-CM | POA: Diagnosis not present

## 2020-10-06 DIAGNOSIS — R278 Other lack of coordination: Secondary | ICD-10-CM | POA: Diagnosis present

## 2020-10-06 DIAGNOSIS — I152 Hypertension secondary to endocrine disorders: Secondary | ICD-10-CM | POA: Diagnosis present

## 2020-10-06 LAB — CBC
HCT: 42.2 % (ref 36.0–46.0)
HCT: 45 % (ref 36.0–46.0)
Hemoglobin: 13.5 g/dL (ref 12.0–15.0)
Hemoglobin: 15 g/dL (ref 12.0–15.0)
MCH: 32.1 pg (ref 26.0–34.0)
MCH: 33.5 pg (ref 26.0–34.0)
MCHC: 32 g/dL (ref 30.0–36.0)
MCHC: 33.3 g/dL (ref 30.0–36.0)
MCV: 100.5 fL — ABNORMAL HIGH (ref 80.0–100.0)
MCV: 100.4 fL — ABNORMAL HIGH (ref 80.0–100.0)
Platelets: 504 10*3/uL — ABNORMAL HIGH (ref 150–400)
Platelets: 467 10*3/uL — ABNORMAL HIGH (ref 150–400)
RBC: 4.2 MIL/uL (ref 3.87–5.11)
RBC: 4.48 MIL/uL (ref 3.87–5.11)
RDW: 15.8 % — ABNORMAL HIGH (ref 11.5–15.5)
RDW: 15.9 % — ABNORMAL HIGH (ref 11.5–15.5)
WBC: 7.6 10*3/uL (ref 4.0–10.5)
WBC: 8 10*3/uL (ref 4.0–10.5)
nRBC: 0.3 % — ABNORMAL HIGH (ref 0.0–0.2)
nRBC: 0 % (ref 0.0–0.2)

## 2020-10-06 LAB — DIFFERENTIAL
Abs Immature Granulocytes: 0.03 10*3/uL (ref 0.00–0.07)
Basophils Absolute: 0 10*3/uL (ref 0.0–0.1)
Basophils Relative: 1 %
Eosinophils Absolute: 0 10*3/uL (ref 0.0–0.5)
Eosinophils Relative: 0 %
Immature Granulocytes: 0 %
Lymphocytes Relative: 21 %
Lymphs Abs: 1.6 10*3/uL (ref 0.7–4.0)
Monocytes Absolute: 0.6 10*3/uL (ref 0.1–1.0)
Monocytes Relative: 7 %
Neutro Abs: 5.4 10*3/uL (ref 1.7–7.7)
Neutrophils Relative %: 71 %

## 2020-10-06 LAB — COMPREHENSIVE METABOLIC PANEL
ALT: 19 U/L (ref 0–44)
AST: 20 U/L (ref 15–41)
Albumin: 3.7 g/dL (ref 3.5–5.0)
Alkaline Phosphatase: 59 U/L (ref 38–126)
Anion gap: 10 (ref 5–15)
BUN: 29 mg/dL — ABNORMAL HIGH (ref 8–23)
CO2: 19 mmol/L — ABNORMAL LOW (ref 22–32)
Calcium: 9.9 mg/dL (ref 8.9–10.3)
Chloride: 109 mmol/L (ref 98–111)
Creatinine, Ser: 2.26 mg/dL — ABNORMAL HIGH (ref 0.44–1.00)
GFR, Estimated: 22 mL/min — ABNORMAL LOW (ref >60)
Glucose, Bld: 153 mg/dL — ABNORMAL HIGH (ref 70–99)
Potassium: 4.2 mmol/L (ref 3.5–5.1)
Sodium: 138 mmol/L (ref 135–145)
Total Bilirubin: 0.9 mg/dL (ref 0.3–1.2)
Total Protein: 7.1 g/dL (ref 6.5–8.1)

## 2020-10-06 LAB — RAPID URINE DRUG SCREEN, HOSP PERFORMED
Amphetamines: NOT DETECTED
Barbiturates: NOT DETECTED
Benzodiazepines: NOT DETECTED
Cocaine: NOT DETECTED
Opiates: NOT DETECTED
Tetrahydrocannabinol: NOT DETECTED

## 2020-10-06 LAB — GLUCOSE, CAPILLARY
Glucose-Capillary: 64 mg/dL — ABNORMAL LOW (ref 70–99)
Glucose-Capillary: 156 mg/dL — ABNORMAL HIGH (ref 70–99)

## 2020-10-06 LAB — SARS CORONAVIRUS 2 BY RT PCR (HOSPITAL ORDER, PERFORMED IN ~~LOC~~ HOSPITAL LAB): SARS Coronavirus 2: NEGATIVE

## 2020-10-06 LAB — CBG MONITORING, ED: Glucose-Capillary: 142 mg/dL — ABNORMAL HIGH (ref 70–99)

## 2020-10-06 LAB — PROTIME-INR
INR: 1 (ref 0.8–1.2)
Prothrombin Time: 13.1 seconds (ref 11.4–15.2)

## 2020-10-06 LAB — CREATININE, SERUM
Creatinine, Ser: 2.11 mg/dL — ABNORMAL HIGH (ref 0.44–1.00)
GFR, Estimated: 23 mL/min — ABNORMAL LOW (ref >60)

## 2020-10-06 LAB — APTT: aPTT: 24 seconds (ref 24–36)

## 2020-10-06 MED ORDER — ACETAMINOPHEN 160 MG/5ML PO SOLN: 650.0000 mg | ORAL | Status: DC | PRN

## 2020-10-06 MED ORDER — INSULIN ASPART 100 UNIT/ML ~~LOC~~ SOLN
0.0000 [IU] | Freq: Three times a day (TID) | SUBCUTANEOUS | Status: DC
  Administered 2020-10-07: 1 [IU] via SUBCUTANEOUS
  Administered 2020-10-07: 5 [IU] via SUBCUTANEOUS
  Administered 2020-10-08 (×2): 2 [IU] via SUBCUTANEOUS
  Administered 2020-10-09: 1 [IU] via SUBCUTANEOUS

## 2020-10-06 MED ORDER — STROKE: EARLY STAGES OF RECOVERY BOOK: Freq: Once | Status: DC

## 2020-10-06 MED ORDER — ASPIRIN EC 325 MG PO TBEC
325.0000 mg | DELAYED_RELEASE_TABLET | Freq: Every day | ORAL | Status: DC
  Administered 2020-10-08 – 2020-10-09 (×2): 325 mg via ORAL
  Filled 2020-10-06 (×3): qty 1

## 2020-10-06 MED ORDER — FAMOTIDINE 20 MG PO TABS
20.0000 mg | ORAL_TABLET | Freq: Every day | ORAL | Status: DC
  Administered 2020-10-08 – 2020-10-09 (×2): 20 mg via ORAL
  Filled 2020-10-06 (×3): qty 1

## 2020-10-06 MED ORDER — SENNOSIDES-DOCUSATE SODIUM 8.6-50 MG PO TABS: 1.0000 | ORAL_TABLET | Freq: Every evening | ORAL | Status: DC | PRN

## 2020-10-06 MED ORDER — ACETAMINOPHEN 325 MG PO TABS: 650.0000 mg | ORAL_TABLET | ORAL | Status: DC | PRN

## 2020-10-06 MED ORDER — HEPARIN SODIUM (PORCINE) 5000 UNIT/ML IJ SOLN
5000.0000 [IU] | Freq: Three times a day (TID) | INTRAMUSCULAR | Status: DC
  Administered 2020-10-06 – 2020-10-09 (×9): 5000 [IU] via SUBCUTANEOUS
  Filled 2020-10-06 (×9): qty 1

## 2020-10-06 MED ORDER — ASPIRIN EC 81 MG PO TBEC
81.0000 mg | DELAYED_RELEASE_TABLET | Freq: Every day | ORAL | Status: DC
  Administered 2020-10-06: 81 mg via ORAL

## 2020-10-06 MED ORDER — CLOPIDOGREL BISULFATE 75 MG PO TABS
75.0000 mg | ORAL_TABLET | Freq: Every day | ORAL | Status: DC
  Administered 2020-10-08 – 2020-10-09 (×2): 75 mg via ORAL
  Filled 2020-10-06 (×3): qty 1

## 2020-10-06 MED ORDER — SODIUM CHLORIDE 0.9% FLUSH: 3.0000 mL | Freq: Once | INTRAVENOUS | Status: DC

## 2020-10-06 MED ORDER — INSULIN ASPART 100 UNIT/ML ~~LOC~~ SOLN: 0.0000 [IU] | SUBCUTANEOUS | Status: DC

## 2020-10-06 MED ORDER — ACETAMINOPHEN 650 MG RE SUPP: 650.0000 mg | RECTAL | Status: DC | PRN

## 2020-10-06 MED ORDER — HYDROXYUREA 500 MG PO CAPS
500.0000 mg | ORAL_CAPSULE | Freq: Every day | ORAL | Status: DC
  Administered 2020-10-08 – 2020-10-09 (×2): 500 mg via ORAL
  Filled 2020-10-06 (×3): qty 1

## 2020-10-06 MED ORDER — INSULIN ASPART 100 UNIT/ML ~~LOC~~ SOLN: 0.0000 [IU] | Freq: Every day | SUBCUTANEOUS | Status: DC

## 2020-10-06 MED ORDER — ROSUVASTATIN CALCIUM 20 MG PO TABS
40.0000 mg | ORAL_TABLET | Freq: Every day | ORAL | Status: DC
  Administered 2020-10-08 – 2020-10-09 (×2): 40 mg via ORAL
  Filled 2020-10-06 (×3): qty 2

## 2020-10-06 NOTE — Discharge Instructions (Signed)
I am concerned about your symptoms, and feel you need further evaluation to ensure this has not been a stroke.  I would like you to go to the ER now.

## 2020-10-06 NOTE — ED Triage Notes (Signed)
Pt reports 2 days of loss of balance, dizziness and headache, no neuro deficits noted in triage. Pt a.o

## 2020-10-06 NOTE — ED Provider Notes (Signed)
[facility] EMERGENCY DEPARTMENT Provider Note   CSN: [id number] Arrival date & time: 10/06/20  1124     History Chief Complaint  Patient presents with  . Dizziness    Brandi Mccormick is a 80 y.o. female.  80 year old female with prior medical history as detailed below presents for evaluation of dizziness.  Patient reports acute onset of dizziness and vertiginous symptoms 3 days prior.  She reports that she was watching TV when the symptoms developed.  She had mild headache for the first 24 hours.  Headache is now resolved.  She reports that she has trouble standing.  She has to hold onto something to keep from falling over when attempting to stand.  She denies nausea or vomiting.    The history is provided by the patient and medical records.  Dizziness Quality:  Vertigo Severity:  Mild Onset quality:  Sudden Duration:  3 days Timing:  Constant Progression:  Unchanged Chronicity:  New Relieved by:  Nothing Worsened by:  Nothing      Past Medical History:  Diagnosis Date  . Diabetes ([place])   . GERD (gastroesophageal reflux disease)   . Hyperlipidemia   . Hypertension   . Hyperthyroidism   . Pneumonia   . Shingles     Patient Active Problem List   Diagnosis Date Noted  . Preventative health care 05/06/2019  . Uncontrolled type 2 diabetes mellitus with hyperglycemia ([place]) 05/06/2019  . Dizziness 11/05/2018  . DM (diabetes mellitus) type II uncontrolled, periph vascular disorder ([place]) 09/11/2018  . Hyperlipidemia associated with type 2 diabetes mellitus ([place]) 04/02/2018  . 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 Pneumonia   . Essential hypertension 01/14/2017  . Hyperlipidemia 11/15/2016  . Polycythemia vera ([place]) 07/01/2014  . Protein-calorie malnutrition, severe ([place]) 06/25/2014  . Protein-calorie malnutrition ([place]) 06/24/2014  . Hypokalemia 06/24/2014  . CAP (community acquired pneumonia) 06/24/2014  . H/O hyperthyroidism 12/21/2013  . Rash  01/24/2013    Past Surgical History:  Procedure Laterality Date  . CATARACT EXTRACTION Left 2020  . COLONOSCOPY    . DENTAL SURGERY       OB History   No obstetric history on file.     Family History  Problem Relation Age of Onset  . Diabetes Sister   . Diabetes Brother   . Diabetes Sister   . Diabetes Mother        and on mother's side of family  . Colon cancer Mother   . Stroke Mother   . Diabetes Father        and on father's side of family  . Heart disease Neg Hx   . Rectal cancer Neg Hx   . Stomach cancer Neg Hx     Social History   Tobacco Use  . Smoking status: Never Smoker  . Smokeless tobacco: Never Used  Vaping Use  . Vaping Use: Never used  Substance Use Topics  . Alcohol use: Yes    Alcohol/week: 0.0 standard drinks    Comment: occasional wine cooler  . Drug use: No    Home Medications Prior to Admission medications   Medication Sig Start Date End Date Taking? Authorizing Provider  amLODipine (NORVASC) 10 MG tablet Take 1 tablet by mouth once daily 09/21/20   Carollee Herter, Alferd Apa, DO  aspirin EC 81 MG tablet Take 81 mg by mouth daily.    [provider]  famotidine (PEPCID) 20 MG tablet Take 20 mg by mouth daily.    [provider]  furosemide (LASIX) 20 MG tablet Take 20 mg by mouth 2 (two) times daily as needed. 02/06/19   [provider]  glipiZIDE (GLUCOTROL XL) 5 MG 24 hr tablet Take 2 tablets (10 mg total) by mouth daily. 07/03/20   Philemon Kingdom, MD  hydroxyurea (HYDREA) 500 MG capsule Take 1 capsule by mouth once daily 09/21/20   Ladell Pier, MD  Lancets [facility] DELICA PLUS [id number]) [place] USE   TO CHECK GLUCOSE TWICE DAILY 08/02/19   Philemon Kingdom, MD  lisinopril (ZESTRIL) 40 MG tablet Take 1 tablet (40 mg total) by mouth daily. 07/07/20   [name], DO  metoprolol succinate (TOPROL-XL) 25 MG 24 hr tablet Take 1 tablet by mouth once daily 07/31/20   Carollee Herter, Alferd Apa, DO  ONETOUCH VERIO  test strip USE 1 STRIP TO CHECK GLUCOSE ONCE DAILY 01/04/20   Philemon Kingdom, MD  rosuvastatin (CRESTOR) 40 MG tablet Take 1 tablet by mouth once daily 07/31/20   [name], DO    Allergies    Penicillins  Review of Systems   Review of Systems  Neurological: Positive for dizziness.  All other systems reviewed and are negative.   Physical Exam Updated Vital Signs BP 133/77   Pulse 72   Temp 98.3 F (36.8 C)   Resp 19   SpO2 100%   Physical Exam Vitals and nursing note reviewed.  Constitutional:      General: She is not in acute distress.    Appearance: Normal appearance. She is well-developed and well-nourished.  HENT:     Head: Normocephalic and atraumatic.     Mouth/Throat:     Mouth: Oropharynx is clear and moist.  Eyes:     Extraocular Movements: EOM normal.     Conjunctiva/sclera: Conjunctivae normal.     Pupils: Pupils are equal, round, and reactive to light.  Cardiovascular:     Rate and Rhythm: Normal rate and regular rhythm.     Heart sounds: Normal heart sounds.  Pulmonary:     Effort: Pulmonary effort is normal. No respiratory distress.     Breath sounds: Normal breath sounds.  Abdominal:     General: There is no distension.     Palpations: Abdomen is soft.     Tenderness: There is no abdominal tenderness.  Musculoskeletal:        General: No deformity or edema. Normal range of motion.     Cervical back: Normal range of motion and neck supple.  Skin:    General: Skin is warm and dry.  Neurological:     Mental Status: She is alert and oriented to person, place, and time.     Gait: Gait abnormal.     Comments: Alert and oriented x4 No facial droop  Normal speech  LVO screen negative  Significant ataxia with standing    Psychiatric:        Mood and Affect: Mood and affect normal.     ED Results / Procedures / Treatments   Labs (all labs ordered are listed, but only abnormal results are displayed) Labs Reviewed  CBC -  Abnormal; Notable for the following components:      Result Value   MCV 100.5 (*)    RDW 15.8 (*)    Platelets 504 (*)    nRBC 0.3 (*)    All other components within normal limits  COMPREHENSIVE METABOLIC PANEL - Abnormal; Notable for the following components:   CO2 19 (*)  Glucose, Bld 153 (*)    BUN 29 (*)    Creatinine, Ser 2.26 (*)    GFR, Estimated 22 (*)    All other components within normal limits  SARS CORONAVIRUS 2 BY RT PCR [facility] ORDER, [place] LAB)  PROTIME-INR  APTT  DIFFERENTIAL    EKG EKG Interpretation  Date/Time:  Friday October 06 2020 12:18:22 EST Ventricular Rate:  77 PR Interval:  166 QRS Duration: 70 QT Interval:  406 QTC Calculation: 459 R Axis:   77 Text Interpretation: Normal sinus rhythm Normal ECG Confirmed by Dene Gentry [phone]) on 10/06/2020 1:22:44 PM   Radiology CT HEAD WO CONTRAST  Result Date: 10/06/2020 CLINICAL DATA:  Neuro deficit. EXAM: CT HEAD WITHOUT CONTRAST TECHNIQUE: Contiguous axial images were obtained from the base of the skull through the vertex without intravenous contrast. COMPARISON:  None. FINDINGS: Brain: Focal low density is seen involving the inferior portion of the left cerebellum medially concerning for acute infarction. Ventricular size is within normal limits. No midline shift is noted. No hemorrhage is noted. Vascular: No hyperdense vessel or unexpected calcification. Skull: Normal. Negative for fracture or focal lesion. Sinuses/Orbits: No acute finding. Other: None. IMPRESSION: Focal low density is seen involving the inferior portion of the left cerebellum medially concerning for acute infarction. MRI is recommended for further evaluation. Electronically Signed   By: Marijo Conception M.D.   On: 10/06/2020 12:48    Procedures Procedures  CRITICAL CARE Performed by: Valarie Merino   Total critical care time: 30 minutes  Critical care time was exclusive of separately billable  procedures and treating other patients.  Critical care was necessary to treat or prevent imminent or life-threatening deterioration.  Critical care was time spent personally by me on the following activities: development of treatment plan with patient and/or surrogate as well as nursing, discussions with consultants, evaluation of patient's response to treatment, examination of patient, obtaining history from patient or surrogate, ordering and performing treatments and interventions, ordering and review of laboratory studies, ordering and review of radiographic studies, pulse oximetry and re-evaluation of patient's condition.    Medications Ordered in ED Medications  sodium chloride flush (NS) 0.9 % injection 3 mL (has no administration in time range)    ED Course  I have reviewed the triage vital signs and the nursing notes.  Pertinent labs & imaging results that were available during my care of the patient were reviewed by me and considered in my medical decision making (see chart for details).    MDM Rules/Calculators/A&P                         MDM  Screen complete  JACOLE CAPLEY was evaluated in Emergency Department on 10/06/2020 for the symptoms described in the history of present illness. She was evaluated in the context of the global COVID-19 pandemic, which necessitated consideration that the patient might be at risk for infection with the SARS-CoV-2 virus that causes COVID-19. Institutional protocols and algorithms that pertain to the evaluation of patients at risk for COVID-19 are in a state of rapid change based on information released by regulatory bodies including the CDC and federal and state organizations. These policies and algorithms were followed during the patient's care in the ED.  Patient is presenting with acute onset vertiginous symptoms.  CT noncontrast suggest cerebellar infarct.  Patient will require admission for further work-up.  Neurology is aware of  case.  MR  ordered.  Medicine team is aware of case and will evaluate for admission.   Final Clinical Impression(s) / ED Diagnoses Final diagnoses:  Dizziness    Rx / DC Orders ED Discharge Orders    None       Valarie Merino, MD 10/06/20 [phone]

## 2020-10-06 NOTE — ED Provider Notes (Signed)
[facility]    CSN: [id number] Arrival date & time: 10/06/20  0848      History   Chief Complaint Chief Complaint  Patient presents with  . balance issues  . Headache  . Hyperglycemia    HPI Brandi Mccormick is a 80 y.o. female.   Brandi Mccormick presents with complaints of loss of balance which started two days ago. Yesterday with bad headache. This has resolved. No vision changes. No extremity weakness, numbness tingling, confusion, facial droop or dizziness. Denies any previous similar. She did have some nausea/ vomiting prior to onset of symptoms, but this has resolved. Her blood sugar has been somewhat elevated for her. History of DM, gerd, htn, polycythemia vera. She is not on any blood thinners outside of aspirin. She has not follen, no head injury.     ROS per HPI, negative if not otherwise mentioned.      Past Medical History:  Diagnosis Date  . Diabetes ([place])   . GERD (gastroesophageal reflux disease)   . Hyperlipidemia   . Hypertension   . Hyperthyroidism   . Pneumonia   . Shingles     Patient Active Problem List   Diagnosis Date Noted  . Preventative health care 05/06/2019  . Uncontrolled type 2 diabetes mellitus with hyperglycemia ([place]) 05/06/2019  . Dizziness 11/05/2018  . DM (diabetes mellitus) type II uncontrolled, periph vascular disorder ([place]) 09/11/2018  . Hyperlipidemia associated with type 2 diabetes mellitus ([place]) 04/02/2018  . 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 Pneumonia   . Essential hypertension 01/14/2017  . Hyperlipidemia 11/15/2016  . Polycythemia vera ([place]) 07/01/2014  . Protein-calorie malnutrition, severe ([place]) 06/25/2014  . Protein-calorie malnutrition ([place]) 06/24/2014  . Hypokalemia 06/24/2014  . CAP (community acquired pneumonia) 06/24/2014  . H/O hyperthyroidism 12/21/2013  . Rash 01/24/2013    Past Surgical History:  Procedure Laterality Date  . CATARACT EXTRACTION Left 2020  . COLONOSCOPY    . DENTAL  SURGERY      OB History   No obstetric history on file.      Home Medications    Prior to Admission medications   Medication Sig Start Date End Date Taking? Authorizing Provider  amLODipine (NORVASC) 10 MG tablet Take 1 tablet by mouth once daily 09/21/20   Carollee Herter, Alferd Apa, DO  aspirin EC 81 MG tablet Take 81 mg by mouth daily.    [provider]  famotidine (PEPCID) 20 MG tablet Take 20 mg by mouth daily.    [provider]  furosemide (LASIX) 20 MG tablet Take 20 mg by mouth 2 (two) times daily as needed. 02/06/19   [provider]  glipiZIDE (GLUCOTROL XL) 5 MG 24 hr tablet Take 2 tablets (10 mg total) by mouth daily. 07/03/20   Philemon Kingdom, MD  hydroxyurea (HYDREA) 500 MG capsule Take 1 capsule by mouth once daily 09/21/20   Ladell Pier, MD  Lancets [facility] DELICA PLUS [id number]) [place] USE   TO CHECK GLUCOSE TWICE DAILY 08/02/19   Philemon Kingdom, MD  lisinopril (ZESTRIL) 40 MG tablet Take 1 tablet (40 mg total) by mouth daily. 07/07/20   [name], DO  metoprolol succinate (TOPROL-XL) 25 MG 24 hr tablet Take 1 tablet by mouth once daily 07/31/20   Carollee Herter, Alferd Apa, DO  ONETOUCH VERIO test strip USE 1 STRIP TO CHECK GLUCOSE ONCE DAILY 01/04/20   Philemon Kingdom, MD  rosuvastatin (CRESTOR) 40 MG tablet Take 1 tablet by mouth once  daily 07/31/20   [name], DO    Family History Family History  Problem Relation Age of Onset  . Diabetes Sister   . Diabetes Brother   . Diabetes Sister   . Diabetes Mother        and on mother's side of family  . Colon cancer Mother   . Stroke Mother   . Diabetes Father        and on father's side of family  . Heart disease Neg Hx   . Rectal cancer Neg Hx   . Stomach cancer Neg Hx     Social History Social History   Tobacco Use  . Smoking status: Never Smoker  . Smokeless tobacco: Never Used  Vaping Use  . Vaping Use: Never used  Substance Use Topics  . Alcohol  use: Yes    Alcohol/week: 0.0 standard drinks    Comment: occasional wine cooler  . Drug use: No     Allergies   Penicillins   Review of Systems Review of Systems   Physical Exam Triage Vital Signs ED Triage Vitals  Enc Vitals Group     BP 10/06/20 0931 125/67     Pulse Rate 10/06/20 0931 74     Resp 10/06/20 0931 16     Temp 10/06/20 0931 98.2 F (36.8 C)     Temp Source 10/06/20 0931 Oral     SpO2 10/06/20 0931 100 %     Weight --      Height --      Head Circumference --      Peak Flow --      Pain Score 10/06/20 0929 0     Pain Loc --      Pain Edu? --      Excl. in [place]? --    No data found.  Updated Vital Signs BP 125/67 (BP Location: Right Arm)   Pulse 74   Temp 98.2 F (36.8 C) (Oral)   Resp 16   SpO2 100%   Visual Acuity Right Eye Distance:   Left Eye Distance:   Bilateral Distance:    Right Eye Near:   Left Eye Near:    Bilateral Near:     Physical Exam Constitutional:      General: She is not in acute distress.    Appearance: She is well-developed.  HENT:     Head: Normocephalic and atraumatic.  Eyes:     Extraocular Movements: Extraocular movements intact.     Pupils: Pupils are equal, round, and reactive to light.  Cardiovascular:     Rate and Rhythm: Normal rate.  Pulmonary:     Effort: Pulmonary effort is normal.  Skin:    General: Skin is warm and dry.  Neurological:     Mental Status: She is alert and oriented to person, place, and time. Mental status is at baseline.     Cranial Nerves: No cranial nerve deficit or facial asymmetry.     Sensory: No sensory deficit.     Coordination: Romberg sign positive.     Comments: Patient is unable to ambulate at baseline, unable to ambulate without holding on for stabilization, unable to stand with eyes closed without holding on for stabilization  Psychiatric:        Mood and Affect: Mood normal.      UC Treatments / Results  Labs (all labs ordered are listed, but only abnormal  results are displayed) Labs Reviewed  CBG MONITORING, ED - Abnormal;  Notable for the following components:      Result Value   Glucose-Capillary 142 (*)    All other components within normal limits    EKG   Radiology No results found.  Procedures Procedures (including critical care time)  Medications Ordered in UC Medications - No data to display  Initial Impression / Assessment and Plan / UC Course  I have reviewed the triage vital signs and the nursing notes.  Pertinent labs & imaging results that were available during my care of the patient were reviewed by me and considered in my medical decision making (see chart for details).     New onset of loss of balance. Headache yesterday. Symptoms started two days ago without improvement. Concern for cva. No pronator drift, facial asymmetry or confusion. No numbness or weakness of extremities. Patient husband agreeable to driving patient to ER now. Patient verbalized understanding and agreeable to plan.   Final Clinical Impressions(s) / UC Diagnoses   Final diagnoses:  Loss of balance     Discharge Instructions     I am concerned about your symptoms, and feel you need further evaluation to ensure this has not been a stroke.  I would like you to go to the ER now.     ED Prescriptions    None     PDMP not reviewed this encounter.   Zigmund Gottron, NP 10/06/20 1049

## 2020-10-06 NOTE — ED Notes (Signed)
Pt to room 20 from triage

## 2020-10-06 NOTE — Progress Notes (Signed)
  Echocardiogram 2D Echocardiogram has been performed.  Brandi Mccormick 10/06/2020, 6:05 PM

## 2020-10-06 NOTE — ED Notes (Signed)
Attempted to call report, nurse is unavailable

## 2020-10-06 NOTE — Consult Note (Addendum)
Neurology Consult H&P  CC: unsteadiness  History is obtained from: Patient and chart  HPI: Brandi Mccormick is a 80 y.o. female right handed PMHx as reviewed below with balance issues which began ~3 days ago. She feels unsteady and when she stands it feels like she is falling and needs to grab on to something to hold herself up.  She had never had this before. Denies slurred speech. Denies falls, focal weakness, numbness or tingling in legs.   Denies hearing/vision changes, N/V, fev, chills, cp, sob.  LKW: n/a tpa given?: No, OSW IR Thrombectomy? No,  Modified Rankin Scale: 0-Completely asymptomatic and back to baseline post- stroke NIHSS: 0  ROS: A complete ROS was performed and is negative except as noted in the HPI.   Past Medical History:  Diagnosis Date  . Diabetes ([place])   . GERD (gastroesophageal reflux disease)   . Hyperlipidemia   . Hypertension   . Hyperthyroidism   . Pneumonia   . Shingles      Family History  Problem Relation Age of Onset  . Diabetes Sister   . Diabetes Brother   . Diabetes Sister   . Diabetes Mother        and on mother's side of family  . Colon cancer Mother   . Stroke Mother   . Diabetes Father        and on father's side of family  . Heart disease Neg Hx   . Rectal cancer Neg Hx   . Stomach cancer Neg Hx     Social History:  reports that she has never smoked. She has never used smokeless tobacco. She reports current alcohol use. She reports that she does not use drugs.   Prior to Admission medications   Medication Sig Start Date End Date Taking? Authorizing Provider  amLODipine (NORVASC) 10 MG tablet Take 1 tablet by mouth once daily 09/21/20   Carollee Herter, Alferd Apa, DO  aspirin EC 81 MG tablet Take 81 mg by mouth daily.    [provider]  famotidine (PEPCID) 20 MG tablet Take 20 mg by mouth daily.    [provider]  furosemide (LASIX) 20 MG tablet Take 20 mg by mouth 2 (two) times daily as needed. 02/06/19    [provider]  glipiZIDE (GLUCOTROL XL) 5 MG 24 hr tablet Take 2 tablets (10 mg total) by mouth daily. 07/03/20   Philemon Kingdom, MD  hydroxyurea (HYDREA) 500 MG capsule Take 1 capsule by mouth once daily 09/21/20   Ladell Pier, MD  Lancets [facility] DELICA PLUS [id number]) [place] USE   TO CHECK GLUCOSE TWICE DAILY 08/02/19   Philemon Kingdom, MD  lisinopril (ZESTRIL) 40 MG tablet Take 1 tablet (40 mg total) by mouth daily. 07/07/20   [name], DO  metoprolol succinate (TOPROL-XL) 25 MG 24 hr tablet Take 1 tablet by mouth once daily 07/31/20   Carollee Herter, Alferd Apa, DO  ONETOUCH VERIO test strip USE 1 STRIP TO CHECK GLUCOSE ONCE DAILY 01/04/20   Philemon Kingdom, MD  rosuvastatin (CRESTOR) 40 MG tablet Take 1 tablet by mouth once daily 07/31/20   [name], DO    Exam: Current vital signs: BP 133/77   Pulse 72   Temp 98.3 F (36.8 C)   Resp 19   SpO2 100%   Physical Exam  Constitutional: Appears well-developed and well-nourished.  Psych: Affect appropriate to situation Eyes: No scleral injection HENT: No OP obstruction. Head: Normocephalic.  Cardiovascular: Normal rate and regular rhythm.  Respiratory: Effort normal, symmetric excursions bilaterally, no audible wheezing. GI: Soft.  No distension. There is no tenderness.  Skin: WDI  Neuro: Mental Status: Patient is awake, alert, oriented to person, place, month, year, and situation. Patient is able to give a clear and coherent history. No signs of aphasia or neglect. Cranial Nerves: II: Visual Fields are full. Pupils are equal, round, and reactive to light. III,IV, VI: EOMI without ptosis or diploplia.  V: Facial sensation is symmetric to temperature VII: Facial movement is symmetric.  VIII: hearing is intact to voice X: Uvula elevates symmetrically XI: Shoulder shrug is symmetric. XII: tongue is midline without atrophy or fasciculations.  Motor: Tone is normal. Bulk is normal. 5/5  strength was present in all four extremities. Sensory: Sensation is symmetric to light touch and temperature in the arms and legs. Deep Tendon Reflexes: 2+ and symmetric in the biceps and patellae. Plantars: Toes are downgoing bilaterally. Cerebellar: FNF and HKS are intact bilaterally. Romberg (+) to left.  I have reviewed labs in epic and the pertinent results are: No labs at the time of evaluaiton  I have reviewed the images obtained: NCT head showed focal low density is seen involving the inferior portion of the left cerebellum medially concerning for acute infarction.   Assessment: Brandi Mccormick is a 80 y.o. female right handed PMHx DM2, HLD, HTN with unsteadiness toward left side and CT head showing left cerebellar stroke. The patient has vascular risk factors and will need further stroke evaluation.  Impression:  Left cerebellar stroke Unsteadiness on feet NIHSS 0 DM 2 HLD HTN  Plan: - MRI brain and MRA head and neck without contrast - Pending - Recommend TTE. - Recommend labs: HbA1c, lipid panel, TSH. - Recommend Statin if LDL > 70 - Aspirin 81mg  daily. - Clopidogrel 75mg  daily for 3 weeks. - SBP goal <180. - Telemetry monitoring for arrhythmia. - Recommend bedside Swallow screen. - Recommend Stroke education. - Recommend PT/OT/SLP consult.  Electronically signed by:  Lynnae Sandhoff, MD Page: [phone] 10/06/2020, 1:35 PM

## 2020-10-06 NOTE — ED Notes (Signed)
Cardiac [place] being done at beside

## 2020-10-06 NOTE — ED Triage Notes (Signed)
Pt presents with balance issues, headache, and hyperglycemia xs 3 days. Denies numbness or tingling in legs.   States CBG was approx 140 this am.

## 2020-10-06 NOTE — ED Notes (Signed)
Patient transported to MRI 

## 2020-10-06 NOTE — ED Notes (Signed)
Patient is being discharged from the Urgent Care and sent to the Emergency Department via POV with family member . Per Rondel Oh, NP, patient is in need of higher level of care due to further imaging for weakness and headache. Patient is aware and verbalizes understanding of plan of care.  Vitals:   10/06/20 0931  BP: 125/67  Pulse: 74  Resp: 16  Temp: 98.2 F (36.8 C)  SpO2: 100%

## 2020-10-06 NOTE — ED Notes (Signed)
Patient transported to Ultrasound 

## 2020-10-06 NOTE — H&P (Signed)
[facility] History and Physical  Brandi Mccormick ACZ:[id number] DOB: 04-24-1941 DOA: 10/06/2020  PCP: [name], DO  Patient coming from: Home  Chief Complaint: Dizziness  HPI: Brandi Mccormick is a 80 y.o. female with a medical history of essential hypertension, diabetes, GERD, who presented to the emergency department with complaints of dizziness.  Patient tells me that her legs simply are not working.  This started approximately 2 to 3 days ago.  Patient state and she also had a headache which resolved.  She feels that her legs are "drunk" and will go the right way.  Denies any recent illness, travel, sick contacts, chest pain, shortness of breath, abdominal pain, nausea or vomiting, diarrhea or constipation, problems with urination.  Patient does state that she did get the COVID-19 vaccine and booster.  ED Course: CT showed possibility of CVA.  Neurology as well as [facility] called.  Review of Systems:  All other systems reviewed and are negative.   Past Medical History:  Diagnosis Date  . Diabetes ([place])   . GERD (gastroesophageal reflux disease)   . Hyperlipidemia   . Hypertension   . Hyperthyroidism   . Pneumonia   . Shingles     Past Surgical History:  Procedure Laterality Date  . CATARACT EXTRACTION Left 2020  . COLONOSCOPY    . DENTAL SURGERY      Social History:  reports that she has never smoked. She has never used smokeless tobacco. She reports current alcohol use. She reports that she does not use drugs.   Allergies  Allergen Reactions  . Penicillins Nausea And Vomiting    Has patient had a PCN reaction causing immediate rash, facial/tongue/throat swelling, SOB or lightheadedness with hypotension: no Has patient had a PCN reaction causing severe rash involving mucus membranes or skin necrosis: no Has patient had a PCN reaction that required hospitalization: no Has patient had a PCN reaction occurring within the last 10 years: no If all of the  above answers are "NO", then may proceed with Cephalosporin use.     Family History  Problem Relation Age of Onset  . Diabetes Sister   . Diabetes Brother   . Diabetes Sister   . Diabetes Mother        and on mother's side of family  . Colon cancer Mother   . Stroke Mother   . Diabetes Father        and on father's side of family  . Heart disease Neg Hx   . Rectal cancer Neg Hx   . Stomach cancer Neg Hx      Prior to Admission medications   Medication Sig Start Date End Date Taking? Authorizing Provider  amLODipine (NORVASC) 10 MG tablet Take 1 tablet by mouth once daily 09/21/20   Carollee Herter, Alferd Apa, DO  aspirin EC 81 MG tablet Take 81 mg by mouth daily.    [provider]  famotidine (PEPCID) 20 MG tablet Take 20 mg by mouth daily.    [provider]  furosemide (LASIX) 20 MG tablet Take 20 mg by mouth 2 (two) times daily as needed. 02/06/19   [provider]  glipiZIDE (GLUCOTROL XL) 5 MG 24 hr tablet Take 2 tablets (10 mg total) by mouth daily. 07/03/20   Philemon Kingdom, MD  hydroxyurea (HYDREA) 500 MG capsule Take 1 capsule by mouth once daily 09/21/20   Ladell Pier, MD  Lancets (ONETOUCH DELICA PLUS [id number]) [place] USE   TO CHECK  GLUCOSE TWICE DAILY 08/02/19   Philemon Kingdom, MD  lisinopril (ZESTRIL) 40 MG tablet Take 1 tablet (40 mg total) by mouth daily. 07/07/20   [name], DO  metoprolol succinate (TOPROL-XL) 25 MG 24 hr tablet Take 1 tablet by mouth once daily 07/31/20   Carollee Herter, Alferd Apa, DO  ONETOUCH VERIO test strip USE 1 STRIP TO CHECK GLUCOSE ONCE DAILY 01/04/20   Philemon Kingdom, MD  rosuvastatin (CRESTOR) 40 MG tablet Take 1 tablet by mouth once daily 07/31/20   [name], DO    Physical Exam: Vitals:   10/06/20 1210  BP: 133/77  Pulse: 72  Resp: 19  Temp: 98.3 F (36.8 C)  SpO2: 100%     General: Well developed, well nourished, NAD, appears stated age  HEENT: NCAT, PERRLA, EOMI,  Anicteic Sclera, mucous membranes moist.   Neck: Supple, no JVD, no masses  Cardiovascular: S1 S2 auscultated, no rubs, murmurs or gallops. Regular rate and rhythm.  Respiratory: Clear to auscultation bilaterally with equal chest rise  Abdomen: Soft, nontender, nondistended, + bowel sounds  Extremities: warm dry without cyanosis clubbing or edema  Neuro: AAOx3, cranial nerves grossly intact. Strength 5/5 in patient's upper extremities.  Mildly weak in the lower extremities 4/5. Gait not tested.   Skin: Without rashes exudates or nodules  Psych: Normal affect and demeanor with intact judgement and insight  Labs on Admission: I have personally reviewed following labs and imaging studies CBC: Recent Labs  Lab 10/06/20 1214  WBC 7.6  NEUTROABS 5.4  HGB 13.5  HCT 42.2  MCV 100.5*  PLT 828*   Basic Metabolic Panel: Recent Labs  Lab 10/06/20 1214  NA 138  K 4.2  CL 109  CO2 19*  GLUCOSE 153*  BUN 29*  CREATININE 2.26*  CALCIUM 9.9   GFR: CrCl cannot be calculated (Unknown ideal weight.). Liver Function Tests: Recent Labs  Lab 10/06/20 1214  AST 20  ALT 19  ALKPHOS 59  BILITOT 0.9  PROT 7.1  ALBUMIN 3.7   No results for input(s): LIPASE, AMYLASE in the last 168 hours. No results for input(s): AMMONIA in the last 168 hours. Coagulation Profile: Recent Labs  Lab 10/06/20 1214  INR 1.0   Cardiac Enzymes: No results for input(s): CKTOTAL, CKMB, CKMBINDEX, TROPONINI in the last 168 hours. BNP (last 3 results) No results for input(s): PROBNP in the last 8760 hours. HbA1C: No results for input(s): HGBA1C in the last 72 hours. CBG: Recent Labs  Lab 10/06/20 1038  GLUCAP 142*   Lipid Profile: No results for input(s): CHOL, HDL, LDLCALC, TRIG, CHOLHDL, LDLDIRECT in the last 72 hours. Thyroid Function Tests: No results for input(s): TSH, T4TOTAL, FREET4, T3FREE, THYROIDAB in the last 72 hours. Anemia Panel: No results for input(s): VITAMINB12, FOLATE,  FERRITIN, TIBC, IRON, RETICCTPCT in the last 72 hours. Urine analysis:    Component Value Date/Time   COLORURINE STRAW (A) 06/02/2018 1715   APPEARANCEUR HAZY (A) 06/02/2018 1715   LABSPEC 1.010 06/02/2018 1715   PHURINE 6.0 06/02/2018 1715   GLUCOSEU 150 (A) 06/02/2018 1715   HGBUR SMALL (A) 06/02/2018 1715   BILIRUBINUR NEGATIVE 06/02/2018 1715   BILIRUBINUR neg 06/23/2015 1033   KETONESUR 5 (A) 06/02/2018 1715   PROTEINUR 100 (A) 06/02/2018 1715   UROBILINOGEN 0.2 06/23/2015 1033   UROBILINOGEN 1.0 10/23/2013 2011   NITRITE NEGATIVE 06/02/2018 1715   LEUKOCYTESUR TRACE (A) 06/02/2018 1715   Sepsis Labs: @LABRCNTIP (procalcitonin:4,lacticidven:4) )No results found for this or  any previous visit (from the past 240 hour(s)).   Radiological Exams on Admission: CT HEAD WO CONTRAST  Result Date: 10/06/2020 CLINICAL DATA:  Neuro deficit. EXAM: CT HEAD WITHOUT CONTRAST TECHNIQUE: Contiguous axial images were obtained from the base of the skull through the vertex without intravenous contrast. COMPARISON:  None. FINDINGS: Brain: Focal low density is seen involving the inferior portion of the left cerebellum medially concerning for acute infarction. Ventricular size is within normal limits. No midline shift is noted. No hemorrhage is noted. Vascular: No hyperdense vessel or unexpected calcification. Skull: Normal. Negative for fracture or focal lesion. Sinuses/Orbits: No acute finding. Other: None. IMPRESSION: Focal low density is seen involving the inferior portion of the left cerebellum medially concerning for acute infarction. MRI is recommended for further evaluation. Electronically Signed   By: Marijo Conception M.D.   On: 10/06/2020 12:48    EKG: None  Assessment/Plan Acute CVA with dizziness -Patient experienced dizziness approximately 2 days ago and is still having issues with ambulation. -CT head showed focal low density involving inferior portion of the left cerebellum medially  concerning for acute infarction. -MRI and MRA pending -Have ordered echocardiogram, carotid Doppler, lipid panel, hemoglobin A1c -PT and OT consulted -Neurology consulted and appreciated -Continue aspirin, statin  Essential hypertension -Will hold blood pressure medications and allow for permissive hypertension given possibility of acute CVA  Diabetes mellitus, type II -Hold glipizide -Will place on insulin sliding scale with CBG monitoring -Hemoglobin A1c pending  Chronic kidney disease, stage IV -Continue to monitor creatinine, appears to be close to baseline   DVT prophylaxis: Heparin  Code Status: Full  Family Communication: Husband at bedside    Disposition Plan: Home  Consults called: Neurology  Admission status: Observation  Time spent: 70 minutes  Lathaniel Legate D.O. [facility]  Between 7am to 7pm - Please see pager noted on [email or web address]  After 7pm go to [email or web address] 10/06/2020, 3:55 PM

## 2020-10-07 ENCOUNTER — Observation Stay (HOSPITAL_COMMUNITY): Payer: Medicare HMO

## 2020-10-07 DIAGNOSIS — R42 Dizziness and giddiness: Secondary | ICD-10-CM | POA: Diagnosis not present

## 2020-10-07 DIAGNOSIS — Z7982 Long term (current) use of aspirin: Secondary | ICD-10-CM | POA: Diagnosis not present

## 2020-10-07 DIAGNOSIS — N1832 Chronic kidney disease, stage 3b: Secondary | ICD-10-CM | POA: Diagnosis not present

## 2020-10-07 DIAGNOSIS — I6322 Cerebral infarction due to unspecified occlusion or stenosis of basilar arteries: Secondary | ICD-10-CM | POA: Diagnosis not present

## 2020-10-07 DIAGNOSIS — R29700 NIHSS score 0: Secondary | ICD-10-CM | POA: Diagnosis present

## 2020-10-07 DIAGNOSIS — Z20822 Contact with and (suspected) exposure to covid-19: Secondary | ICD-10-CM | POA: Diagnosis not present

## 2020-10-07 DIAGNOSIS — I63543 Cerebral infarction due to unspecified occlusion or stenosis of bilateral cerebellar arteries: Secondary | ICD-10-CM | POA: Diagnosis not present

## 2020-10-07 DIAGNOSIS — I10 Essential (primary) hypertension: Secondary | ICD-10-CM | POA: Diagnosis not present

## 2020-10-07 DIAGNOSIS — Z833 Family history of diabetes mellitus: Secondary | ICD-10-CM | POA: Diagnosis not present

## 2020-10-07 DIAGNOSIS — N179 Acute kidney failure, unspecified: Secondary | ICD-10-CM | POA: Diagnosis not present

## 2020-10-07 DIAGNOSIS — N184 Chronic kidney disease, stage 4 (severe): Secondary | ICD-10-CM | POA: Diagnosis not present

## 2020-10-07 DIAGNOSIS — I129 Hypertensive chronic kidney disease with stage 1 through stage 4 chronic kidney disease, or unspecified chronic kidney disease: Secondary | ICD-10-CM | POA: Diagnosis present

## 2020-10-07 DIAGNOSIS — E1151 Type 2 diabetes mellitus with diabetic peripheral angiopathy without gangrene: Secondary | ICD-10-CM | POA: Diagnosis not present

## 2020-10-07 DIAGNOSIS — E785 Hyperlipidemia, unspecified: Secondary | ICD-10-CM | POA: Diagnosis not present

## 2020-10-07 DIAGNOSIS — I639 Cerebral infarction, unspecified: Secondary | ICD-10-CM | POA: Diagnosis not present

## 2020-10-07 DIAGNOSIS — R278 Other lack of coordination: Secondary | ICD-10-CM | POA: Diagnosis not present

## 2020-10-07 DIAGNOSIS — Z880 Allergy status to penicillin: Secondary | ICD-10-CM | POA: Diagnosis not present

## 2020-10-07 DIAGNOSIS — Z823 Family history of stroke: Secondary | ICD-10-CM | POA: Diagnosis not present

## 2020-10-07 DIAGNOSIS — I6503 Occlusion and stenosis of bilateral vertebral arteries: Secondary | ICD-10-CM | POA: Diagnosis not present

## 2020-10-07 DIAGNOSIS — D45 Polycythemia vera: Secondary | ICD-10-CM | POA: Diagnosis present

## 2020-10-07 DIAGNOSIS — E7800 Pure hypercholesterolemia, unspecified: Secondary | ICD-10-CM | POA: Diagnosis not present

## 2020-10-07 DIAGNOSIS — E1122 Type 2 diabetes mellitus with diabetic chronic kidney disease: Secondary | ICD-10-CM | POA: Diagnosis not present

## 2020-10-07 DIAGNOSIS — E1165 Type 2 diabetes mellitus with hyperglycemia: Secondary | ICD-10-CM | POA: Diagnosis not present

## 2020-10-07 DIAGNOSIS — R590 Localized enlarged lymph nodes: Secondary | ICD-10-CM | POA: Diagnosis present

## 2020-10-07 DIAGNOSIS — Z79899 Other long term (current) drug therapy: Secondary | ICD-10-CM | POA: Diagnosis not present

## 2020-10-07 DIAGNOSIS — K219 Gastro-esophageal reflux disease without esophagitis: Secondary | ICD-10-CM | POA: Diagnosis present

## 2020-10-07 DIAGNOSIS — Z7984 Long term (current) use of oral hypoglycemic drugs: Secondary | ICD-10-CM | POA: Diagnosis not present

## 2020-10-07 LAB — GLUCOSE, CAPILLARY
Glucose-Capillary: 149 mg/dL — ABNORMAL HIGH (ref 70–99)
Glucose-Capillary: 253 mg/dL — ABNORMAL HIGH (ref 70–99)
Glucose-Capillary: 88 mg/dL (ref 70–99)
Glucose-Capillary: 169 mg/dL — ABNORMAL HIGH (ref 70–99)

## 2020-10-07 LAB — CBC
HCT: 36 % (ref 36.0–46.0)
Hemoglobin: 12.3 g/dL (ref 12.0–15.0)
MCH: 33.2 pg (ref 26.0–34.0)
MCHC: 34.2 g/dL (ref 30.0–36.0)
MCV: 97.3 fL (ref 80.0–100.0)
Platelets: 454 10*3/uL — ABNORMAL HIGH (ref 150–400)
RBC: 3.7 MIL/uL — ABNORMAL LOW (ref 3.87–5.11)
RDW: 15.8 % — ABNORMAL HIGH (ref 11.5–15.5)
WBC: 6.6 10*3/uL (ref 4.0–10.5)
nRBC: 0 % (ref 0.0–0.2)

## 2020-10-07 LAB — LIPID PANEL
Cholesterol: 172 mg/dL (ref 0–200)
HDL: 53 mg/dL (ref >40)
LDL Cholesterol: 99 mg/dL (ref 0–99)
Total CHOL/HDL Ratio: 3.2 RATIO
Triglycerides: 102 mg/dL (ref <150)
VLDL: 20 mg/dL (ref 0–40)

## 2020-10-07 LAB — BASIC METABOLIC PANEL
Anion gap: 9 (ref 5–15)
BUN: 32 mg/dL — ABNORMAL HIGH (ref 8–23)
CO2: 20 mmol/L — ABNORMAL LOW (ref 22–32)
Calcium: 9.2 mg/dL (ref 8.9–10.3)
Chloride: 108 mmol/L (ref 98–111)
Creatinine, Ser: 2.23 mg/dL — ABNORMAL HIGH (ref 0.44–1.00)
GFR, Estimated: 22 mL/min — ABNORMAL LOW (ref >60)
Glucose, Bld: 222 mg/dL — ABNORMAL HIGH (ref 70–99)
Potassium: 3.9 mmol/L (ref 3.5–5.1)
Sodium: 137 mmol/L (ref 135–145)

## 2020-10-07 LAB — HEMOGLOBIN A1C
Hgb A1c MFr Bld: 8 % — ABNORMAL HIGH (ref 4.8–5.6)
Mean Plasma Glucose: 182.9 mg/dL

## 2020-10-07 MED ORDER — SODIUM CHLORIDE 0.9 % IV SOLN: INTRAVENOUS | Status: DC

## 2020-10-07 NOTE — Evaluation (Addendum)
Speech Language Pathology Evaluation Patient Details Name: Brandi Mccormick MRN: [id number] DOB: 08-04-1941 Today's Date: 10/07/2020 Time: 1343-1410 SLP Time Calculation (min) (ACUTE ONLY): 27 min  Problem List:  Patient Active Problem List   Diagnosis Date Noted  . Acute CVA (cerebrovascular accident) ([place]) 10/06/2020  . Preventative health care 05/06/2019  . Uncontrolled type 2 diabetes mellitus with hyperglycemia ([place]) 05/06/2019  . Dizziness 11/05/2018  . DM (diabetes mellitus) type II uncontrolled, periph vascular disorder ([place]) 09/11/2018  . Hyperlipidemia associated with type 2 diabetes mellitus ([place]) 04/02/2018  . 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 Pneumonia   . Essential hypertension 01/14/2017  . Hyperlipidemia 11/15/2016  . Polycythemia vera ([place]) 07/01/2014  . Protein-calorie malnutrition, severe ([place]) 06/25/2014  . Protein-calorie malnutrition ([place]) 06/24/2014  . Hypokalemia 06/24/2014  . CAP (community acquired pneumonia) 06/24/2014  . H/O hyperthyroidism 12/21/2013  . Rash 01/24/2013   Past Medical History:  Past Medical History:  Diagnosis Date  . Diabetes ([place])   . GERD (gastroesophageal reflux disease)   . Hyperlipidemia   . Hypertension   . Hyperthyroidism   . Pneumonia   . Shingles    Past Surgical History:  Past Surgical History:  Procedure Laterality Date  . CATARACT EXTRACTION Left 2020  . COLONOSCOPY    . DENTAL SURGERY     HPI:  Brandi Mccormick is a 80 y.o. female with a medical history of essential hypertension, diabetes, GERD, who presented to the emergency department with complaints of dizziness.  Patient tells me that her legs simply are not working.  This started approximately 2 to 3 days ago.  Patient state and she also had a headache which resolved. 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  Assessment / Plan / Recommendation Clinical Impression  Pt administered the SLUMS assessment ([place] Mental Status Examination) with a score obtained of 17/25 with exception noted for education level and elimination of graphic expression (clock formation) as pt stated she "couldn't use her dominant hand for writing anymore" (reported decreased use of dominant hand over last year).  Decreased with decreased awareness of deficits noted with questions regarding functional deficits re: driving/cooking at home.  Pt demonstrated decreased memory recall for new information with 3/5 words recalled after time delay, but 5/5 with categorization cues given and increased processing time for directives.  Pt able to formulate 10 animals without aborting this task.  Attention impaired with simple calculation and repeating numbers backward tasks. Pt achieved 75% with auditory comprehension task with paragraph retention.  Pt oriented x4 and OME WFL. No dysphagia noted as she passed the [place] swallow screen.  Recommend continuing ST in acute setting for cognitive impairment. Thank you for this consult.    SLP Assessment  Pt would benefit from further ST for cognitive deficits SLP Visit Diagnosis: Cognitive communication deficit (R41.841)    Follow Up Recommendations  CIR vs HH or OP SLP   Frequency and Duration   2x/wk        SLP Evaluation Cognition  Overall Cognitive Status: History of cognitive impairments - impaired/different from baseline Arousal/Alertness: Awake/alert Orientation Level: Oriented X4 Memory: Impaired Memory Impairment: Decreased recall of new information Immediate Memory Recall: Sock;Blue;Bed Memory Recall Sock: Without Cue Memory Recall Blue: Without Cue Memory Recall Bed: With Cue Awareness: Impaired Problem Solving: Impaired Safety/Judgment: Impaired      Comprehension  Auditory Comprehension Overall Auditory Comprehension:  Appears within functional limits for tasks assessed Yes/No Questions: Within Functional Limits Commands: Impaired Conversation: Simple Visual Recognition/Discrimination Discrimination: Within Functional Limits Reading Comprehension Reading Status: Not tested    Expression Expression Primary Mode of Expression: Verbal Verbal Expression Overall Verbal Expression: Appears within functional limits for tasks assessed Initiation: No impairment Level of Generative/Spontaneous Verbalization: Conversation Repetition: No impairment Naming: No impairment Pragmatics: No impairment Non-Verbal Means of Communication: Not applicable Written Expression Dominant Hand: Right Written Expression: Not tested   Oral / Motor  Oral Motor/Sensory Function Overall Oral Motor/Sensory Function: Within functional limits Motor Speech Overall Motor Speech: Appears within functional limits for tasks assessed Respiration: Within functional limits Phonation: Normal Resonance: Within functional limits Articulation: Within functional limitis Intelligibility: Intelligible Motor Planning: Witnin functional limits Motor Speech Errors: Not applicable                       Brandi Mccormick, M.S., CCC-SLP 10/07/2020, 2:22 PM

## 2020-10-07 NOTE — Progress Notes (Signed)
VASCULAR LAB    Carotid duplex has been performed.  See CV proc for preliminary results.   Elaine [place], RVT 10/07/2020, 1:35 PM

## 2020-10-07 NOTE — Evaluation (Addendum)
Physical Therapy Evaluation Patient Details Name: Brandi Mccormick MRN: [id number] DOB: 1941/07/28 Today's Date: 10/07/2020   History of Present Illness  Pt is a 80 y.o. female who presents with acute impaired balance and dizziness that began 2 days PTA along with a headache. CT concerning for acute infarct in L medial cerebellum. 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 PMH: DM, HTN, polycythemia vera, and hyperthyroidism.  Clinical Impression  Pt presents with condition above and deficits mentioned below, see PT Problem List. PTA, she was independent with all functional mobility without AD/AE. Pt lives with husband who can assist her 24/7 if needed. Currently, pt displays symmetrical bil lower extremity weakness, balance, and coordination deficits along with ataxia with mobility that place her at risk for falls and injuries. Pt required minA to come to stand from various surfaces and min-modA to maintain her balance when ambulating short distances in her room. She is unable to ambulate more than short household distances at this time. Pt's balance did improve when using a RW, but she needs further training to manage it safely. Of note, pt had particular difficulty initiating movements when trying to pass through bathroom doorway to exit today. Pt would benefit from continued acute PT services followed by intensive therapies in the CIR setting to maximize her independence and safety with all functional mobility and decrease her risk for falls. Provided handout and education on BE FAST.    Follow Up Recommendations CIR;Supervision for mobility/OOB    Equipment Recommendations  Rolling walker with 5" wheels;3in1 (PT)    Recommendations for Other Services Rehab consult     Precautions / Restrictions Precautions Precautions: Fall Restrictions Weight Bearing  Restrictions: No      Mobility  Bed Mobility Overal bed mobility: Needs Assistance Bed Mobility: Supine to Sit;Sit to Supine     Supine to sit: Supervision Sit to supine: Supervision   General bed mobility comments: Bed flat with rails down, extra time to come to sit with supervision for safety.    Transfers Overall transfer level: Needs assistance Equipment used: 1 person hand held assist;Rolling walker (2 wheeled) Transfers: Sit to/from Stand Sit to Stand: Min assist         General transfer comment: MinA coming from lower surfaces with extra time to power up to stand. 1x from EOB with HHA, 1x from EOB with RW (needing cues for hand placement), and 1x from toilet pulling up on bar  Ambulation/Gait Ambulation/Gait assistance: Min assist;Mod assist Gait Distance (Feet): 30 Feet (x3 bouts of ~15 ft > ~15 ft > ~30 ft) Assistive device: None;1 person hand held assist;Rolling walker (2 wheeled) Gait Pattern/deviations: Step-through pattern;Decreased step length - right;Decreased step length - left;Decreased stride length;Wide base of support;Ataxic Gait velocity: reduced Gait velocity interpretation: <1.31 ft/sec, indicative of household ambulator General Gait Details: Pt ambulates with ataxic gait pattern, reaching out for hand of therapist or object to hold onto often. Particular difficulty initiating leg movement at doorway when exiting bathroom. Min-modA for balance. Improved balance with use of RW final bout but pt psuhes RW distal to her and needs cues to remain midline within RW, minA for safety.  Stairs            Wheelchair Mobility    Modified Rankin (Stroke Patients Only) Modified Rankin (Stroke Patients Only) Pre-Morbid Rankin Score: No symptoms Modified Rankin: Moderately severe disability     Balance  Overall balance assessment: Needs assistance Sitting-balance support: No upper extremity supported;Feet supported Sitting balance-Leahy Scale:  Fair Sitting balance - Comments: Static sitting EOB no LOB, min guard for safety.   Standing balance support: Single extremity supported;No upper extremity supported;Bilateral upper extremity supported;During functional activity Standing balance-Leahy Scale: Fair Standing balance comment: Pt able to stand without UE support for short bouts but prefers 1-2 UE support especially with mobility. External assistance needed.                             Pertinent Vitals/Pain Pain Assessment: No/denies pain    Home Living Family/patient expects to be discharged to:: Private residence Living Arrangements: Spouse/significant other Available Help at Discharge: Family;Available 24 hours/day Type of Home: House Home Access: Stairs to enter Entrance Stairs-Rails: None Entrance Stairs-Number of Steps: 1 Home Layout: One level Home Equipment: Hand held shower head      Prior Function Level of Independence: Independent         Comments: Pt independent with all functional mobility and ADLs. Pt drives. Retired.     Hand Dominance   Dominant Hand: Right    Extremity/Trunk Assessment   Upper Extremity Assessment Upper Extremity Assessment: Defer to OT evaluation    Lower Extremity Assessment Lower Extremity Assessment: RLE deficits/detail;LLE deficits/detail RLE Deficits / Details: MMT scores: hip flexion 4-, knee extension 4, knee flexion 3+, ankle dorsiflexion 4+ RLE Sensation: WNL RLE Coordination: decreased gross motor (no dysdiadochokinesia or dysmetria noted) LLE Deficits / Details: MMT scores: hip flexion 4-, knee extension 4, knee flexion 3+, ankle dorsiflexion 4+ LLE Sensation: WNL LLE Coordination: decreased gross motor (no dysdiadochokinesia or dysmetria noted)    Cervical / Trunk Assessment Cervical / Trunk Assessment: Normal  Communication   Communication: No difficulties  Cognition Arousal/Alertness: Awake/alert Behavior During Therapy: WFL for tasks  assessed/performed Overall Cognitive Status: Impaired/Different from baseline Area of Impairment: Following commands;Safety/judgement;Awareness;Problem solving                       Following Commands: Follows one step commands consistently;Follows one step commands with increased time Safety/Judgement: Decreased awareness of safety;Decreased awareness of deficits Awareness: Emergent Problem Solving: Slow processing;Decreased initiation;Difficulty sequencing;Requires verbal cues;Requires tactile cues General Comments: A&Ox4. Requires repeated cues to initiate and sequence steps out of bathroom and to safely manage RW.      General Comments      Exercises     Assessment/Plan    PT Assessment Patient needs continued PT services  PT Problem List Decreased strength;Decreased activity tolerance;Decreased balance;Decreased mobility;Decreased coordination;Decreased cognition;Decreased knowledge of use of DME;Decreased safety awareness;Decreased knowledge of precautions       PT Treatment Interventions DME instruction;Gait training;Stair training;Functional mobility training;Therapeutic activities;Therapeutic exercise;Balance training;Neuromuscular re-education;Cognitive remediation;Patient/family education    PT Goals (Current goals can be found in the Care Plan section)  Acute Rehab PT Goals Patient Stated Goal: to go home and see grandchildren PT Goal Formulation: With patient/family Time For Goal Achievement: 10/21/20 Potential to Achieve Goals: Good    Frequency Min 4X/week   Barriers to discharge        Co-evaluation               AM-PAC PT "6 Clicks" Mobility  Outcome Measure Help needed turning from your back to your side while in a flat bed without using bedrails?: A Little Help needed moving from lying on your back to sitting on the side of a flat  bed without using bedrails?: A Little Help needed moving to and from a bed to a chair (including a  wheelchair)?: A Lot Help needed standing up from a chair using your arms (e.g., wheelchair or bedside chair)?: A Little Help needed to walk in hospital room?: A Lot Help needed climbing 3-5 steps with a railing? : A Lot 6 Click Score: 15    End of Session Equipment Utilized During Treatment: Gait belt Activity Tolerance: Patient tolerated treatment well Patient left: in bed;with bed alarm set;with call bell/phone within reach;with family/visitor present   PT Visit Diagnosis: Unsteadiness on feet (R26.81);Other abnormalities of gait and mobility (R26.89);Muscle weakness (generalized) (M62.81);Ataxic gait (R26.0);Difficulty in walking, not elsewhere classified (R26.2);Other symptoms and signs involving the nervous system (R29.898)    Time: 8867-7373 PT Time Calculation (min) (ACUTE ONLY): 33 min   Charges:   PT Evaluation $PT Eval Moderate Complexity: 1 Mod PT Treatments $Therapeutic Activity: 8-22 mins        Moishe Spice, PT, DPT Acute Rehabilitation Services  Pager: [phone] Office: [phone]   Orvan Falconer 10/07/2020, 3:18 PM

## 2020-10-07 NOTE — Progress Notes (Signed)
PROGRESS NOTE    Brandi Mccormick  KGM:[id number] DOB: [birth year] DOA: 10/06/2020 PCP: [name], DO   Brief Narrative:  HPI On 10/06/2020  Brandi Mccormick is a 80 y.o. female with a medical history of essential hypertension, diabetes, GERD, who presented to the emergency department with complaints of dizziness.  Patient tells me that her legs simply are not working.  This started approximately 2 to 3 days ago.  Patient state and she also had a headache which resolved.  She feels that her legs are "drunk" and will go the right way.  Denies any recent illness, travel, sick contacts, chest pain, shortness of breath, abdominal pain, nausea or vomiting, diarrhea or constipation, problems with urination.  Patient does state that she did get the COVID-19 vaccine and booster.  Interim history Admitted with acute CVA.  Pending neurology input as well as further CVA work-up. Assessment & Plan   Acute CVA with dizziness -Patient experienced dizziness approximately 2 days ago and is still having issues with ambulation. -CT head showed focal low density involving inferior portion of the left cerebellum medially concerning for acute infarction. 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  Poor visualization of left PCA, may reflect occlusion or severely diminished flow.  Heavily diseased distal right vertebral artery.  Severe left and mild to moderate right A1 stenosis. -Echocardiogram pending -LDL 99, hemoglobin A1c 8 -PT and OT consulted and pending -Neurology consulted and appreciated -Continue aspirin, statin  Essential hypertension -Will hold blood pressure medications and allow for permissive hypertension given possibility of acute CVA  Diabetes mellitus, type II -Hold glipizide -Will place on insulin sliding  scale with CBG monitoring -Hemoglobin A1c 8  Chronic kidney disease, stage IV -Continue to monitor creatinine, appears to be close to baseline  DVT Prophylaxis Heparin  Code Status: Full  Family Communication: None at bedside  Disposition Plan:  Status is: Observation  The patient remains OBS appropriate and will d/c before 2 midnights.  Dispo: The patient is from: Home              Anticipated d/c is to: Home              Anticipated d/c date is: 1 day              Patient currently is not medically stable to d/c.   Difficult to place patient No   Consultants Neurology  Procedures  Echocardiogram  Antibiotics   Anti-infectives (From admission, onward)   None      Subjective:   Brandi Mccormick seen and examined today.  Patient has no complaints this morning.  Feels her legs are weak.  Denies current chest pain or shortness of breath, nausea or vomiting, diarrhea or constipation, dizziness or headache.  Objective:   Vitals:   10/06/20 2133 10/07/20 0048 10/07/20 0455 10/07/20 0747  BP:  118/66 127/71 122/67  Pulse:  68 69 73  Resp:  18 17 20   Temp:  98.3 F (36.8 C) 98.3 F (36.8 C) 98.5 F (36.9 C)  TempSrc:  Oral Oral Oral  SpO2:  98% 100% 100%  Weight: 60.3 kg     Height:       No intake or output data in the 24 hours ending 10/07/20 1123 Filed Weights   10/06/20 1737 10/06/20 2133  Weight: 56.2 kg 60.3 kg  Exam  General: Well developed, elderly, NAD  HEENT: NCAT, mucous membranes moist.   Cardiovascular: S1 S2 auscultated, RRR.  Respiratory: Clear to auscultation bilaterally with equal chest rise  Abdomen: Soft, nontender, nondistended, + bowel sounds  Extremities: warm dry without cyanosis clubbing or edema  Neuro: AAOx3, no new deficits les  Psych: pleasant, appropriate mood and affect   Data Reviewed: I have personally reviewed following labs and imaging studies  CBC: Recent Labs  Lab 10/06/20 1214 10/06/20 1801  10/07/20 0222  WBC 7.6 8.0 6.6  NEUTROABS 5.4  --   --   HGB 13.5 15.0 12.3  HCT 42.2 45.0 36.0  MCV 100.5* 100.4* 97.3  PLT 504* 467* 716*   Basic Metabolic Panel: Recent Labs  Lab 10/06/20 1214 10/06/20 1801 10/07/20 0222  NA 138  --  137  K 4.2  --  3.9  CL 109  --  108  CO2 19*  --  20*  GLUCOSE 153*  --  222*  BUN 29*  --  32*  CREATININE 2.26* 2.11* 2.23*  CALCIUM 9.9  --  9.2   GFR: Estimated Creatinine Clearance: 17.7 mL/min (A) (by C-G formula based on SCr of 2.23 mg/dL (H)). Liver Function Tests: Recent Labs  Lab 10/06/20 1214  AST 20  ALT 19  ALKPHOS 59  BILITOT 0.9  PROT 7.1  ALBUMIN 3.7   No results for input(s): LIPASE, AMYLASE in the last 168 hours. No results for input(s): AMMONIA in the last 168 hours. Coagulation Profile: Recent Labs  Lab 10/06/20 1214  INR 1.0   Cardiac Enzymes: No results for input(s): CKTOTAL, CKMB, CKMBINDEX, TROPONINI in the last 168 hours. BNP (last 3 results) No results for input(s): PROBNP in the last 8760 hours. HbA1C: Recent Labs    10/07/20 0222  HGBA1C 8.0*   CBG: Recent Labs  Lab 10/06/20 1038 10/06/20 2011 10/06/20 2052 10/07/20 0635  GLUCAP 142* 64* 156* 149*   Lipid Profile: Recent Labs    10/07/20 0222  CHOL 172  HDL 53  LDLCALC 99  TRIG 102  CHOLHDL 3.2   Thyroid Function Tests: No results for input(s): TSH, T4TOTAL, FREET4, T3FREE, THYROIDAB in the last 72 hours. Anemia Panel: No results for input(s): VITAMINB12, FOLATE, FERRITIN, TIBC, IRON, RETICCTPCT in the last 72 hours. Urine analysis:    Component Value Date/Time   COLORURINE STRAW (A) 06/02/2018 1715   APPEARANCEUR HAZY (A) 06/02/2018 1715   LABSPEC 1.010 06/02/2018 1715   PHURINE 6.0 06/02/2018 1715   GLUCOSEU 150 (A) 06/02/2018 1715   HGBUR SMALL (A) 06/02/2018 1715   BILIRUBINUR NEGATIVE 06/02/2018 1715   BILIRUBINUR neg 06/23/2015 1033   KETONESUR 5 (A) 06/02/2018 1715   PROTEINUR 100 (A) 06/02/2018 1715    UROBILINOGEN 0.2 06/23/2015 1033   UROBILINOGEN 1.0 10/23/2013 2011   NITRITE NEGATIVE 06/02/2018 1715   LEUKOCYTESUR TRACE (A) 06/02/2018 1715   Sepsis Labs: @LABRCNTIP (procalcitonin:4,lacticidven:4)  ) Recent Results (from the past 240 hour(s))  SARS Coronavirus 2 by RT PCR (hospital order, performed in [place] hospital lab) Nasopharyngeal Nasopharyngeal Swab     Status: None   Collection Time: 10/06/20  3:17 PM   Specimen: Nasopharyngeal Swab  Result Value Ref Range Status   SARS Coronavirus 2 NEGATIVE NEGATIVE Final    Comment: (NOTE) SARS-CoV-2 target nucleic acids are NOT DETECTED.  The SARS-CoV-2 RNA is generally detectable in upper and lower respiratory specimens during the acute phase of infection. The lowest concentration of SARS-CoV-2 viral copies this assay  can detect is 250 copies / mL. A negative result does not preclude SARS-CoV-2 infection and should not be used as the sole basis for treatment or other patient management decisions.  A negative result may occur with improper specimen collection / handling, submission of specimen other than nasopharyngeal swab, presence of viral mutation(s) within the areas targeted by this assay, and inadequate number of viral copies (<250 copies / mL). A negative result must be combined with clinical observations, patient history, and epidemiological information.  Fact Sheet for Patients:   StrictlyIdeas.no  Fact Sheet for Healthcare Providers: BankingDealers.co.za  This test is not yet approved or  cleared by the [place] FDA and has been authorized for detection and/or diagnosis of SARS-CoV-2 by FDA under an Emergency Use Authorization (EUA).  This EUA will remain in effect (meaning this test can be used) for the duration of the COVID-19 declaration under Section 564(b)(1) of the Act, 21 U.S.C. section 360bbb-3(b)(1), unless the authorization is terminated or revoked  sooner.  Performed at [facility], [place] [address] [place], [place] [address]       Radiology Studies: DG Chest 2 View  Result Date: 10/06/2020 CLINICAL DATA:  80 year old female with dizziness. EXAM: CHEST - 2 VIEW COMPARISON:  Chest radiograph dated 06/24/2014 FINDINGS: No focal consolidation, pleural effusion or pneumothorax. The cardiac silhouette is within limits. Atherosclerotic calcification of the aorta. No acute osseous pathology. IMPRESSION: No active cardiopulmonary disease. Electronically Signed   By: Anner Crete M.D.   On: 10/06/2020 16:18   CT HEAD WO CONTRAST  Result Date: 10/06/2020 CLINICAL DATA:  Neuro deficit. EXAM: CT HEAD WITHOUT CONTRAST TECHNIQUE: Contiguous axial images were obtained from the base of the skull through the vertex without intravenous contrast. COMPARISON:  None. FINDINGS: Brain: Focal low density is seen involving the inferior portion of the left cerebellum medially concerning for acute infarction. Ventricular size is within normal limits. No midline shift is noted. No hemorrhage is noted. Vascular: No hyperdense vessel or unexpected calcification. Skull: Normal. Negative for fracture or focal lesion. Sinuses/Orbits: No acute finding. Other: None. IMPRESSION: Focal low density is seen involving the inferior portion of the left cerebellum medially concerning for acute infarction. MRI is recommended for further evaluation. Electronically Signed   By: Marijo Conception M.D.   On: 10/06/2020 12:48   MR ANGIO HEAD WO CONTRAST  Result Date: 10/06/2020 CLINICAL DATA:  Dizziness. EXAM: MRI HEAD WITHOUT CONTRAST MRA HEAD WITHOUT CONTRAST TECHNIQUE: Multiplanar, multiecho pulse sequences of the brain and surrounding structures were obtained without intravenous contrast. Angiographic images of the head were obtained using MRA technique without contrast. COMPARISON:  None. FINDINGS: MRI HEAD FINDINGS Brain: There are small acute bilateral cerebellar infarcts with  the largest measuring 2 cm medially in the left cerebellar hemisphere, and these are superimposed on left larger than right chronic cerebellar infarcts. There is also a subcentimeter acute infarct in the right pons with a chronic right paramedian pontine infarct noted as well. Chronic microhemorrhages are noted in the anterior right temporal lobe, left caudate nucleus, and left parietal lobe. T2 hyperintensities in the cerebral white matter bilaterally are nonspecific but compatible with mild chronic small vessel ischemic disease. There are chronic lacunar infarcts in the right thalamus and left genu of the corpus callosum. There is mild cerebral atrophy. Vascular: Abnormal appearance of the distal right vertebral artery and basilar artery as evaluated in greater detail below. Skull and upper cervical spine: Unremarkable bone marrow signal. Sinuses/Orbits: Left cataract extraction.  Minimal mucosal thickening in the right ethmoid and right maxillary sinuses. Trace left mastoid fluid. Other: None. MRA HEAD FINDINGS The visualized portion of the distal left vertebral artery is patent and dominant. The distal right vertebral artery is small with evidence of irregular narrowing and reduced flow. 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 There are large right and moderate-sized left posterior communicating arteries. The right P1 segment is small. The left P1 segment is not visualized and may be absent or occluded. There is moderate irregularity and narrowing of distal right PCA branch vessels without evidence of a significant proximal right PCA stenosis. There is an attenuated appearance of the left PCA with visible flow in the left posterior communicating artery and proximal P2 segment, however minimal to no flow is apparent in the more distal left PCA. The internal carotid arteries are patent from skull base to carotid termini  with mild irregularity but no significant stenosis. The MCAs are patent without evidence of a proximal branch occlusion. There is mild irregular narrowing of the M1 and proximal M2 segments bilaterally. The right ACA is patent with a mild to moderate stenosis noted in the proximal A1 segment. There is a severe proximal left A1 stenosis, and there is occlusion of the left A2 segment proximally. No aneurysm is identified. IMPRESSION: 1. 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 2. Mild chronic small vessel ischemia in the cerebral white matter. 3. 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 4. Poor visualization of the left PCA beginning at the mid P2 level which may reflect occlusion or severely diminished flow. 5. Heavily diseased distal right vertebral artery. 6. 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 Electronically Signed   By: Logan Bores M.D.   On: 10/06/2020 16:09   MR BRAIN WO CONTRAST  Result Date: 10/06/2020 CLINICAL DATA:  Dizziness. EXAM: MRI HEAD WITHOUT CONTRAST MRA HEAD WITHOUT CONTRAST TECHNIQUE: Multiplanar, multiecho pulse sequences of the brain and surrounding structures were obtained without intravenous contrast. Angiographic images of the head were obtained using MRA technique without contrast. COMPARISON:  None. FINDINGS: MRI HEAD FINDINGS Brain: There are small acute bilateral cerebellar infarcts with the largest measuring 2 cm medially in the left cerebellar hemisphere, and these are superimposed on left larger than right chronic cerebellar infarcts. There is also a subcentimeter acute infarct in the right pons with a chronic right paramedian pontine infarct noted as well. Chronic microhemorrhages are noted in the anterior right temporal lobe, left caudate nucleus, and left parietal lobe. T2 hyperintensities in the cerebral white matter bilaterally are nonspecific but  compatible with mild chronic small vessel ischemic disease. There are chronic lacunar infarcts in the right thalamus and left genu of the corpus callosum. There is mild cerebral atrophy. Vascular: Abnormal appearance of the distal right vertebral artery and basilar artery as evaluated in greater detail below. Skull and upper cervical spine: Unremarkable bone marrow signal. Sinuses/Orbits: Left cataract extraction. Minimal mucosal thickening in the right ethmoid and right maxillary sinuses. Trace left mastoid fluid. Other: None. MRA HEAD FINDINGS The visualized portion of the distal left vertebral artery is patent and dominant. The distal right vertebral artery is small with evidence of irregular narrowing and reduced flow. 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 There are large right  and moderate-sized left posterior communicating arteries. The right P1 segment is small. The left P1 segment is not visualized and may be absent or occluded. There is moderate irregularity and narrowing of distal right PCA branch vessels without evidence of a significant proximal right PCA stenosis. There is an attenuated appearance of the left PCA with visible flow in the left posterior communicating artery and proximal P2 segment, however minimal to no flow is apparent in the more distal left PCA. The internal carotid arteries are patent from skull base to carotid termini with mild irregularity but no significant stenosis. The MCAs are patent without evidence of a proximal branch occlusion. There is mild irregular narrowing of the M1 and proximal M2 segments bilaterally. The right ACA is patent with a mild to moderate stenosis noted in the proximal A1 segment. There is a severe proximal left A1 stenosis, and there is occlusion of the left A2 segment proximally. No aneurysm is identified. IMPRESSION: 1. Small acute bilateral cerebellar and  right pontine infarcts superimposed on multiple chronic posterior circulation infarcts. 2. Mild chronic small vessel ischemia in the cerebral white matter. 3. 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 4. Poor visualization of the left PCA beginning at the mid P2 level which may reflect occlusion or severely diminished flow. 5. Heavily diseased distal right vertebral artery. 6. 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 Electronically Signed   By: Logan Bores M.D.   On: 10/06/2020 16:09     Scheduled Meds: .  stroke: mapping our early stages of recovery book   Does not apply Once  . aspirin EC  325 mg Oral Daily  . clopidogrel  75 mg Oral Daily  . famotidine  20 mg Oral Daily  . heparin  5,000 Units Subcutaneous Q8H  . hydroxyurea  500 mg Oral Daily  . insulin aspart  0-5 Units Subcutaneous QHS  . insulin aspart  0-9 Units Subcutaneous TID WC  . rosuvastatin  40 mg Oral Daily  . sodium chloride flush  3 mL Intravenous Once   Continuous Infusions: . sodium chloride       LOS: 0 days   Time Spent in minutes   45 minutes  Leza Apsey D.O. on 10/07/2020 at 11:23 AM  Between 7am to 7pm - Please see pager noted on [email or web address]  After 7pm go to [email or web address]  And look for the night coverage person covering for me after hours  [facility] Office  [phone]

## 2020-10-07 NOTE — Consult Note (Signed)
Chief Complaint: Patient was seen in consultation today for  Chief Complaint  Patient presents with  . Dizziness    Referring Physician(s): Dr. Erlinda Hong  Supervising Physician: Luanne Bras  Patient Status: [facility] - In-pt  History of Present Illness: Brandi Mccormick is a 80 y.o. female with a medical history significant for DM, HTN, shingles and CKD stage IV. She presented to the ED 10/06/20 with complaints of dizziness and leg weakness. CT imaging showed the possibility of a CVA and additional imaging was obtained.   MR Angio Head/Brain 10/06/20 IMPRESSION: 1. 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 2. Mild chronic small vessel ischemia in the cerebral white matter. 3. 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 4. Poor visualization of the left PCA beginning at the mid P2 level which may reflect occlusion or severely diminished flow. 5. Heavily diseased distal right vertebral artery. 6. 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   Neuro Interventional Radiology has been asked to evaluate this patient for an image-guided diagnostic cerebral angiogram for further work up. She is currently taking Aspirin 325 mg and Plavix 75 mg.    Past Medical History:  Diagnosis Date  . Diabetes ([place])   . GERD (gastroesophageal reflux disease)   . Hyperlipidemia   . Hypertension   . Hyperthyroidism   . Pneumonia   . Shingles     Past Surgical History:  Procedure Laterality Date  . CATARACT EXTRACTION Left 2020  . COLONOSCOPY    . DENTAL SURGERY      Allergies: Penicillins  Medications: Prior to Admission medications   Medication Sig Start Date End Date Taking? Authorizing Provider  amLODipine (NORVASC) 10 MG tablet Take 1 tablet by mouth once daily 09/21/20  Yes Lowne Koren Shiver, DO  aspirin EC 81 MG tablet Take 81 mg by mouth daily.   Yes [provider]  famotidine (PEPCID) 20 MG tablet Take 20 mg by mouth daily.   Yes [provider]  furosemide (LASIX) 20 MG tablet Take 20 mg by mouth 2 (two) times daily as needed for fluid. 02/06/19  Yes [provider]  glipiZIDE (GLUCOTROL XL) 5 MG 24 hr tablet Take 2 tablets (10 mg total) by mouth daily. 07/03/20  Yes Philemon Kingdom, MD  hydroxyurea (HYDREA) 500 MG capsule Take 1 capsule by mouth once daily Patient taking differently: Take 500 mg by mouth daily. 09/21/20  Yes Ladell Pier, MD  Lancets [facility] DELICA PLUS [id number]) [place] USE   TO CHECK GLUCOSE TWICE DAILY 08/02/19  Yes Philemon Kingdom, MD  lisinopril (ZESTRIL) 40 MG tablet Take 1 tablet (40 mg total) by mouth daily. 07/07/20  Yes Roma Schanz R, DO  metoprolol succinate (TOPROL-XL) 25 MG 24 hr tablet Take 1 tablet by mouth once daily 07/31/20  Yes Lowne Chase, Alferd Apa, DO  ONETOUCH VERIO test strip USE 1 STRIP TO CHECK GLUCOSE ONCE DAILY 01/04/20  Yes Philemon Kingdom, MD  rosuvastatin (CRESTOR) 40 MG tablet Take 1 tablet by mouth once daily 07/31/20  Yes [name], DO     Family History  Problem Relation Age of Onset  . Diabetes Sister   . Diabetes Brother   . Diabetes Sister   . Diabetes Mother        and on mother's side of family  . Colon cancer Mother   . Stroke Mother   . Diabetes Father  and on father's side of family  . Heart disease Neg Hx   . Rectal cancer Neg Hx   . Stomach cancer Neg Hx     Social History   Socioeconomic History  . Marital status: Married    Spouse name: Not on file  . Number of children: Not on file  . Years of education: Not on file  . Highest education level: Not on file  Occupational History  . Occupation: [employer]--    Comment: retired  Tobacco Use  . Smoking status: Never Smoker  . Smokeless tobacco: Never Used  Vaping Use  . Vaping Use: Never used  Substance and Sexual Activity  . Alcohol use: Yes    Alcohol/week: 0.0  standard drinks    Comment: occasional wine cooler  . Drug use: No  . Sexual activity: Never    Partners: Male  Other Topics Concern  . Not on file  Social History Narrative   Exercise-- yard work,  Walking in Stem Strain: Low Risk   . Difficulty of Paying Living Expenses: Not hard at all  Food Insecurity: No Food Insecurity  . Worried About Charity fundraiser in the Last Year: Never true  . Ran Out of Food in the Last Year: Never true  Transportation Needs: No Transportation Needs  . Lack of Transportation (Medical): No  . Lack of Transportation (Non-Medical): No  Physical Activity: Not on file  Stress: Not on file  Social Connections: Not on file    Review of Systems: A 12 point ROS discussed and pertinent positives are indicated in the HPI above.  All other systems are negative.  Review of Systems  Constitutional: Negative for appetite change and fatigue.  Respiratory: Negative for cough and shortness of breath.   Cardiovascular: Negative for chest pain and leg swelling.  Gastrointestinal: Negative for abdominal pain, diarrhea, nausea and vomiting.  Musculoskeletal: Negative for back pain.  Neurological: Positive for facial asymmetry, weakness and headaches.    Vital Signs: BP 126/71 (BP Location: Right Arm)   Pulse 69   Temp 98.2 F (36.8 C) (Oral)   Resp 16   Ht 5\' 4"  (1.626 m)   Wt 132 lb 15 oz (60.3 kg)   SpO2 99%   BMI 22.82 kg/m   Physical Exam Constitutional:      General: She is not in acute distress.    Appearance: She is not ill-appearing.  HENT:     Mouth/Throat:     Mouth: Mucous membranes are moist.     Pharynx: Oropharynx is clear.  Cardiovascular:     Rate and Rhythm: Normal rate and regular rhythm.  Pulmonary:     Effort: Pulmonary effort is normal.     Breath sounds: Normal breath sounds.  Abdominal:     General: Bowel sounds are normal.     Palpations: Abdomen is soft.   Musculoskeletal:        General: Normal range of motion.  Skin:    General: Skin is warm and dry.  Neurological:     Mental Status: She is alert and oriented to person, place, and time.     Cranial Nerves: Facial asymmetry present.     Motor: Weakness present.     Comments: Right facial droop noticeable when patient smiles. Bilateral lower extremity weakness.    Psychiatric:        Mood and Affect: Mood normal.  Behavior: Behavior normal.        Thought Content: Thought content normal.        Judgment: Judgment normal.     Imaging: DG Chest 2 View  Result Date: 10/06/2020 CLINICAL DATA:  80 year old female with dizziness. EXAM: CHEST - 2 VIEW COMPARISON:  Chest radiograph dated 06/24/2014 FINDINGS: No focal consolidation, pleural effusion or pneumothorax. The cardiac silhouette is within limits. Atherosclerotic calcification of the aorta. No acute osseous pathology. IMPRESSION: No active cardiopulmonary disease. Electronically Signed   By: Anner Crete M.D.   On: 10/06/2020 16:18   CT HEAD WO CONTRAST  Result Date: 10/06/2020 CLINICAL DATA:  Neuro deficit. EXAM: CT HEAD WITHOUT CONTRAST TECHNIQUE: Contiguous axial images were obtained from the base of the skull through the vertex without intravenous contrast. COMPARISON:  None. FINDINGS: Brain: Focal low density is seen involving the inferior portion of the left cerebellum medially concerning for acute infarction. Ventricular size is within normal limits. No midline shift is noted. No hemorrhage is noted. Vascular: No hyperdense vessel or unexpected calcification. Skull: Normal. Negative for fracture or focal lesion. Sinuses/Orbits: No acute finding. Other: None. IMPRESSION: Focal low density is seen involving the inferior portion of the left cerebellum medially concerning for acute infarction. MRI is recommended for further evaluation. Electronically Signed   By: Marijo Conception M.D.   On: 10/06/2020 12:48   MR ANGIO HEAD WO  CONTRAST  Result Date: 10/06/2020 CLINICAL DATA:  Dizziness. EXAM: MRI HEAD WITHOUT CONTRAST MRA HEAD WITHOUT CONTRAST TECHNIQUE: Multiplanar, multiecho pulse sequences of the brain and surrounding structures were obtained without intravenous contrast. Angiographic images of the head were obtained using MRA technique without contrast. COMPARISON:  None. FINDINGS: MRI HEAD FINDINGS Brain: There are small acute bilateral cerebellar infarcts with the largest measuring 2 cm medially in the left cerebellar hemisphere, and these are superimposed on left larger than right chronic cerebellar infarcts. There is also a subcentimeter acute infarct in the right pons with a chronic right paramedian pontine infarct noted as well. Chronic microhemorrhages are noted in the anterior right temporal lobe, left caudate nucleus, and left parietal lobe. T2 hyperintensities in the cerebral white matter bilaterally are nonspecific but compatible with mild chronic small vessel ischemic disease. There are chronic lacunar infarcts in the right thalamus and left genu of the corpus callosum. There is mild cerebral atrophy. Vascular: Abnormal appearance of the distal right vertebral artery and basilar artery as evaluated in greater detail below. Skull and upper cervical spine: Unremarkable bone marrow signal. Sinuses/Orbits: Left cataract extraction. Minimal mucosal thickening in the right ethmoid and right maxillary sinuses. Trace left mastoid fluid. Other: None. MRA HEAD FINDINGS The visualized portion of the distal left vertebral artery is patent and dominant. The distal right vertebral artery is small with evidence of irregular narrowing and reduced flow. 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 There are large right and moderate-sized left posterior communicating arteries. The right P1 segment is small. The left P1 segment is not visualized and  may be absent or occluded. There is moderate irregularity and narrowing of distal right PCA branch vessels without evidence of a significant proximal right PCA stenosis. There is an attenuated appearance of the left PCA with visible flow in the left posterior communicating artery and proximal P2 segment, however minimal to no flow is apparent in the more distal left PCA. The internal carotid arteries are patent from skull  base to carotid termini with mild irregularity but no significant stenosis. The MCAs are patent without evidence of a proximal branch occlusion. There is mild irregular narrowing of the M1 and proximal M2 segments bilaterally. The right ACA is patent with a mild to moderate stenosis noted in the proximal A1 segment. There is a severe proximal left A1 stenosis, and there is occlusion of the left A2 segment proximally. No aneurysm is identified. IMPRESSION: 1. 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 2. Mild chronic small vessel ischemia in the cerebral white matter. 3. 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 4. Poor visualization of the left PCA beginning at the mid P2 level which may reflect occlusion or severely diminished flow. 5. Heavily diseased distal right vertebral artery. 6. 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 Electronically Signed   By: Logan Bores M.D.   On: 10/06/2020 16:09   MR BRAIN WO CONTRAST  Result Date: 10/06/2020 CLINICAL DATA:  Dizziness. EXAM: MRI HEAD WITHOUT CONTRAST MRA HEAD WITHOUT CONTRAST TECHNIQUE: Multiplanar, multiecho pulse sequences of the brain and surrounding structures were obtained without intravenous contrast. Angiographic images of the head were obtained using MRA technique without contrast. COMPARISON:  None. FINDINGS: MRI HEAD FINDINGS Brain: There are small acute bilateral cerebellar infarcts with the largest measuring 2 cm  medially in the left cerebellar hemisphere, and these are superimposed on left larger than right chronic cerebellar infarcts. There is also a subcentimeter acute infarct in the right pons with a chronic right paramedian pontine infarct noted as well. Chronic microhemorrhages are noted in the anterior right temporal lobe, left caudate nucleus, and left parietal lobe. T2 hyperintensities in the cerebral white matter bilaterally are nonspecific but compatible with mild chronic small vessel ischemic disease. There are chronic lacunar infarcts in the right thalamus and left genu of the corpus callosum. There is mild cerebral atrophy. Vascular: Abnormal appearance of the distal right vertebral artery and basilar artery as evaluated in greater detail below. Skull and upper cervical spine: Unremarkable bone marrow signal. Sinuses/Orbits: Left cataract extraction. Minimal mucosal thickening in the right ethmoid and right maxillary sinuses. Trace left mastoid fluid. Other: None. MRA HEAD FINDINGS The visualized portion of the distal left vertebral artery is patent and dominant. The distal right vertebral artery is small with evidence of irregular narrowing and reduced flow. 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 There are large right and moderate-sized left posterior communicating arteries. The right P1 segment is small. The left P1 segment is not visualized and may be absent or occluded. There is moderate irregularity and narrowing of distal right PCA branch vessels without evidence of a significant proximal right PCA stenosis. There is an attenuated appearance of the left PCA with visible flow in the left posterior communicating artery and proximal P2 segment, however minimal to no flow is apparent in the more distal left PCA. The internal carotid arteries are patent from skull base to carotid termini with mild irregularity but  no significant stenosis. The MCAs are patent without evidence of a proximal branch occlusion. There is mild irregular narrowing of the M1 and proximal M2 segments bilaterally. The right ACA is patent with a mild to moderate stenosis noted in the proximal A1 segment. There is a severe proximal left A1 stenosis, and there is occlusion of the left A2 segment proximally. No aneurysm is identified.  IMPRESSION: 1. 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 2. Mild chronic small vessel ischemia in the cerebral white matter. 3. 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 4. Poor visualization of the left PCA beginning at the mid P2 level which may reflect occlusion or severely diminished flow. 5. Heavily diseased distal right vertebral artery. 6. 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 Electronically Signed   By: Logan Bores M.D.   On: 10/06/2020 16:09    Labs:  CBC: Recent Labs    09/06/20 0937 10/06/20 1214 10/06/20 1801 10/07/20 0222  WBC 6.4 7.6 8.0 6.6  HGB 12.9 13.5 15.0 12.3  HCT 38.8 42.2 45.0 36.0  PLT 437* 504* 467* 454*    COAGS: Recent Labs    10/06/20 1214  INR 1.0  APTT 24    BMP: Recent Labs    11/04/19 1038 05/11/20 0939 10/06/20 1214 10/06/20 1801 10/07/20 0222  NA 141 139 138  --  137  K 4.8 5.0 4.2  --  3.9  CL 108 110 109  --  108  CO2 27 19* 19*  --  20*  GLUCOSE 250* 141* 153*  --  222*  BUN 35* 29* 29*  --  32*  CALCIUM 9.6 9.3 9.9  --  9.2  CREATININE 1.95* 2.18* 2.26* 2.11* 2.23*  GFRNONAA  --   --  22* 23* 22*    LIVER FUNCTION TESTS: Recent Labs    11/04/19 1038 05/11/20 0939 10/06/20 1214  BILITOT 0.4 0.3 0.9  AST 14 15 20   ALT 16 13 19   ALKPHOS 56  --  59  PROT 6.2 6.4 7.1  ALBUMIN 3.6  --  3.7    TUMOR MARKERS: No results for input(s): AFPTM, CEA, CA199, CHROMGRNA in the last 8760 hours.  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 The procedure was discussed at the patient's bedside with her husband.  Risks and benefits of this procedure were discussed with the patient including, but not limited to bleeding, infection, vascular injury or contrast induced renal failure.  This interventional procedure involves the use of X-rays and because of the nature of the planned procedure, it is possible that we will have prolonged use of X-ray fluoroscopy.  Potential radiation risks to you include (but are not limited to) the following: - A slightly elevated risk for cancer  several years later in life. This risk is typically less than 0.5% percent. This risk is low in comparison to the normal incidence of human cancer, which is 33% for women and 50% for men according to the [employer]. - Radiation induced injury can include skin redness, resembling a rash, tissue breakdown / ulcers and hair loss (which can be temporary or permanent).   The likelihood of either of these occurring depends on the difficulty of the procedure and whether you are sensitive to radiation due to previous procedures, disease, or genetic conditions.   IF your procedure requires a prolonged use of radiation, you will be notified and given written instructions for further action.  It is your responsibility to monitor the irradiated area for the 2 weeks following the procedure and to notify your physician if you are concerned that you have suffered a radiation induced injury.    All of the patient's questions were  answered, patient is agreeable to proceed. The patient will be NPO at midnight and AM labs have been ordered for 10/09/20.  Consent signed and in IR.   Thank you for this interesting consult.  I greatly enjoyed meeting Brandi Mccormick and look forward to participating in their care.  A copy of this report was sent to the requesting provider on this date.  Electronically Signed: Soyla Dryer, AGACNP-BC [phone] 10/07/2020, 12:34 PM   I spent a total of 20 Minutes    in face to face in clinical consultation, greater than 50% of which was counseling/coordinating care for image-guided diagnostic cerebral angiogram.

## 2020-10-07 NOTE — Progress Notes (Addendum)
STROKE TEAM PROGRESS NOTE   INTERVAL HISTORY  Pt is evaluated at bedside.  Vitals stable.  Patient afebrile. Pt is doing good. Denies any complaints.  On Neuro exam Pt is alert and oriented, no weakness appreciated in UL and LL.  No ataxia.  Mild decreased sensation on right UL.  Patient is on heart healthy carb modified diet.  She is tolerating it well. Pt is eager to go home but willing to stay for IR cerebral Angio on Monday.  CT brain shows focal low-density infarct in left cerebellum, MRI revealed bilateral cerebellar and right pontine infarcts. tPA was not given as she was outside time window.  MRA shows poor visualization of left PCA beginning at mid P2 level which may reflect occlusion, proximal occlusion of the basilar artery with distal reconstitution.  HbA1c 8, U tox negative, lipid profile within normal limits, creatinine elevated at 2.23.   ECHO was done yesterday results pending.  Vascular ultrasound scheduled.  Patient is on Aspirin 325 and Plavix 75 mg.  PT Eval pending. Talked to Dr. Estanislado Pandy, he recommended for IR Cerebral Angio which is scheduled for Monday.   Vitals:   10/06/20 2133 10/07/20 0048 10/07/20 0455 10/07/20 0747  BP:  118/66 127/71 122/67  Pulse:  68 69 73  Resp:  18 17 20   Temp:  98.3 F (36.8 C) 98.3 F (36.8 C) 98.5 F (36.9 C)  TempSrc:  Oral Oral Oral  SpO2:  98% 100% 100%  Weight: 60.3 kg     Height:       CBC:  Recent Labs  Lab 10/06/20 1214 10/06/20 1801 10/07/20 0222  WBC 7.6 8.0 6.6  NEUTROABS 5.4  --   --   HGB 13.5 15.0 12.3  HCT 42.2 45.0 36.0  MCV 100.5* 100.4* 97.3  PLT 504* 467* 563*   Basic Metabolic Panel:  Recent Labs  Lab 10/06/20 1214 10/06/20 1801 10/07/20 0222  NA 138  --  137  K 4.2  --  3.9  CL 109  --  108  CO2 19*  --  20*  GLUCOSE 153*  --  222*  BUN 29*  --  32*  CREATININE 2.26* 2.11* 2.23*  CALCIUM 9.9  --  9.2   Lipid Panel:  Recent Labs  Lab 10/07/20 0222  CHOL 172  TRIG 102  HDL 53  CHOLHDL 3.2   VLDL 20  LDLCALC 99   HgbA1c:  Recent Labs  Lab 10/07/20 0222  HGBA1C 8.0*   Urine Drug Screen:  Recent Labs  Lab 10/06/20 2250  LABOPIA NONE DETECTED  COCAINSCRNUR NONE DETECTED  LABBENZ NONE DETECTED  AMPHETMU NONE DETECTED  THCU NONE DETECTED  LABBARB NONE DETECTED    Alcohol Level No results for input(s): ETH in the last 168 hours.  IMAGING past 24 hours DG Chest 2 View  Result Date: 10/06/2020 CLINICAL DATA:  80 year old female with dizziness. EXAM: CHEST - 2 VIEW COMPARISON:  Chest radiograph dated 06/24/2014 FINDINGS: No focal consolidation, pleural effusion or pneumothorax. The cardiac silhouette is within limits. Atherosclerotic calcification of the aorta. No acute osseous pathology. IMPRESSION: No active cardiopulmonary disease. Electronically Signed   By: Anner Crete M.D.   On: 10/06/2020 16:18   CT HEAD WO CONTRAST  Result Date: 10/06/2020 CLINICAL DATA:  Neuro deficit. EXAM: CT HEAD WITHOUT CONTRAST TECHNIQUE: Contiguous axial images were obtained from the base of the skull through the vertex without intravenous contrast. COMPARISON:  None. FINDINGS: Brain: Focal low density is seen involving the  inferior portion of the left cerebellum medially concerning for acute infarction. Ventricular size is within normal limits. No midline shift is noted. No hemorrhage is noted. Vascular: No hyperdense vessel or unexpected calcification. Skull: Normal. Negative for fracture or focal lesion. Sinuses/Orbits: No acute finding. Other: None. IMPRESSION: Focal low density is seen involving the inferior portion of the left cerebellum medially concerning for acute infarction. MRI is recommended for further evaluation. Electronically Signed   By: Marijo Conception M.D.   On: 10/06/2020 12:48   MR ANGIO HEAD WO CONTRAST  Result Date: 10/06/2020 CLINICAL DATA:  Dizziness. EXAM: MRI HEAD WITHOUT CONTRAST MRA HEAD WITHOUT CONTRAST TECHNIQUE: Multiplanar, multiecho pulse sequences of the brain  and surrounding structures were obtained without intravenous contrast. Angiographic images of the head were obtained using MRA technique without contrast. COMPARISON:  None. FINDINGS: MRI HEAD FINDINGS Brain: There are small acute bilateral cerebellar infarcts with the largest measuring 2 cm medially in the left cerebellar hemisphere, and these are superimposed on left larger than right chronic cerebellar infarcts. There is also a subcentimeter acute infarct in the right pons with a chronic right paramedian pontine infarct noted as well. Chronic microhemorrhages are noted in the anterior right temporal lobe, left caudate nucleus, and left parietal lobe. T2 hyperintensities in the cerebral white matter bilaterally are nonspecific but compatible with mild chronic small vessel ischemic disease. There are chronic lacunar infarcts in the right thalamus and left genu of the corpus callosum. There is mild cerebral atrophy. Vascular: Abnormal appearance of the distal right vertebral artery and basilar artery as evaluated in greater detail below. Skull and upper cervical spine: Unremarkable bone marrow signal. Sinuses/Orbits: Left cataract extraction. Minimal mucosal thickening in the right ethmoid and right maxillary sinuses. Trace left mastoid fluid. Other: None. MRA HEAD FINDINGS The visualized portion of the distal left vertebral artery is patent and dominant. The distal right vertebral artery is small with evidence of irregular narrowing and reduced flow. 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 There are large right and moderate-sized left posterior communicating arteries. The right P1 segment is small. The left P1 segment is not visualized and may be absent or occluded. There is moderate irregularity and narrowing of distal right PCA branch vessels without evidence of a significant proximal right PCA stenosis. There is an  attenuated appearance of the left PCA with visible flow in the left posterior communicating artery and proximal P2 segment, however minimal to no flow is apparent in the more distal left PCA. The internal carotid arteries are patent from skull base to carotid termini with mild irregularity but no significant stenosis. The MCAs are patent without evidence of a proximal branch occlusion. There is mild irregular narrowing of the M1 and proximal M2 segments bilaterally. The right ACA is patent with a mild to moderate stenosis noted in the proximal A1 segment. There is a severe proximal left A1 stenosis, and there is occlusion of the left A2 segment proximally. No aneurysm is identified. IMPRESSION: 1. 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 2. Mild chronic small vessel ischemia in the cerebral white matter. 3. 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 4. Poor visualization of the left PCA beginning at the mid P2 level which may reflect occlusion or severely diminished flow. 5. Heavily diseased distal right vertebral artery. 6. 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  Electronically Signed   By: Logan Bores M.D.   On: 10/06/2020 16:09   MR BRAIN WO CONTRAST  Result Date: 10/06/2020 CLINICAL DATA:  Dizziness. EXAM: MRI HEAD WITHOUT CONTRAST MRA HEAD WITHOUT CONTRAST TECHNIQUE: Multiplanar, multiecho pulse sequences of the brain and surrounding structures were obtained without intravenous contrast. Angiographic images of the head were obtained using MRA technique without contrast. COMPARISON:  None. FINDINGS: MRI HEAD FINDINGS Brain: There are small acute bilateral cerebellar infarcts with the largest measuring 2 cm medially in the left cerebellar hemisphere, and these are superimposed on left larger than right chronic cerebellar infarcts. There is also a subcentimeter acute infarct in the right  pons with a chronic right paramedian pontine infarct noted as well. Chronic microhemorrhages are noted in the anterior right temporal lobe, left caudate nucleus, and left parietal lobe. T2 hyperintensities in the cerebral white matter bilaterally are nonspecific but compatible with mild chronic small vessel ischemic disease. There are chronic lacunar infarcts in the right thalamus and left genu of the corpus callosum. There is mild cerebral atrophy. Vascular: Abnormal appearance of the distal right vertebral artery and basilar artery as evaluated in greater detail below. Skull and upper cervical spine: Unremarkable bone marrow signal. Sinuses/Orbits: Left cataract extraction. Minimal mucosal thickening in the right ethmoid and right maxillary sinuses. Trace left mastoid fluid. Other: None. MRA HEAD FINDINGS The visualized portion of the distal left vertebral artery is patent and dominant. The distal right vertebral artery is small with evidence of irregular narrowing and reduced flow. 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 There are large right and moderate-sized left posterior communicating arteries. The right P1 segment is small. The left P1 segment is not visualized and may be absent or occluded. There is moderate irregularity and narrowing of distal right PCA branch vessels without evidence of a significant proximal right PCA stenosis. There is an attenuated appearance of the left PCA with visible flow in the left posterior communicating artery and proximal P2 segment, however minimal to no flow is apparent in the more distal left PCA. The internal carotid arteries are patent from skull base to carotid termini with mild irregularity but no significant stenosis. The MCAs are patent without evidence of a proximal branch occlusion. There is mild irregular narrowing of the M1 and proximal M2 segments bilaterally. The  right ACA is patent with a mild to moderate stenosis noted in the proximal A1 segment. There is a severe proximal left A1 stenosis, and there is occlusion of the left A2 segment proximally. No aneurysm is identified. IMPRESSION: 1. 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 2. Mild chronic small vessel ischemia in the cerebral white matter. 3. 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 4. Poor visualization of the left PCA beginning at the mid P2 level which may reflect occlusion or severely diminished flow. 5. Heavily diseased distal right vertebral artery. 6. 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 Electronically Signed   By: Logan Bores M.D.   On: 10/06/2020 16:09    PHYSICAL EXAM GENERAL: Awake, alert in NAD HEENT: - Normocephalic and atraumatic LUNGS - Symmetrical chest rise, No labored breathing noted CV - no JVD, No Peripheral Edema ABDOMEN - Soft,  nondistended  Ext: warm, well perfused, intact peripheral pulses, no Peripheral edema  NEURO Exam:   Mental Status: AA&Ox4 Oriented to self, age, place,  situation, Good Attention and Concentration Language: speech is Fluent.  Pt is able to name simple objects, repetition intact,  fluency, and comprehension intact. Cranial Nerves:   CN II Pupils equal and reactive to light, no VF deficits   CN III,IV,VI EOM intact, no gaze preference or deviation, no nystagmus    CN V normal sensation in V1, V2, and V3 segments bilaterally    CN VII no asymmetry, no nasolabial fold flattening    CN VIII normal hearing to speech    CN IX & X normal palatal elevation, no uvular deviation    CN XI 5/5 head turn and 5/5 shoulder shrug bilaterally    CN XII midline tongue protrusion    Motor: No Drift in Upper Extremities, No Drift in LE's. Strength 5/5 in Rt UE, 5/5 in Lt UE, Strength in Rt LE  5/5, Lt LE 5/5 Reflexes- UE -2+  , LE-   2+ Tone: is normal and bulk is normal. Sensation- Mid decreased sensation to light touch on Rt UE. Intact to light touch bilaterally.  Coordination: FTN intact bilaterally, no ataxia in BLE. Gait- deferred   ASSESSMENT/PLAN Brandi Mccormick is a 80 y.o. female with history of DM, GERD, HLD, HTN, hyperthyroidism presenting with balance issues and unsteady feeling for 3 days. CT brain shows focal low-density infarct in left cerebellum, MRI revealed bilateral cerebellar and right pontine infarcts. tPA was not given as she was outside time window.  MRA shows poor visualization of left PCA beginning at mid P2 level which may reflect occlusion, proximal occlusion of the basilar artery with distal reconstitution.  HbA1c 8, Urine tox negative, lipid profile within normal limits.  ECHO was done yesterday results pending.  Vascular ultrasound scheduled.  Patient is on Aspirin 325 and Plavix 75 mg.  PT Eval pending. Talked to Dr. Estanislado Pandy, he recommended for IR Cerebral Angio which is scheduled for Monday.   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  MRI  Small acute bilateral cerebellar and right pontine infarcts superimposed on multiple chronic posterior circulation infarcts.    MRA - Proximal occlusion of the basilar artery with distal Reconstitution. Poor visualization of the left PCA beginning at the mid P2 level which may reflect occlusion or severely diminished flow. Heavily diseased distal right vertebral artery. 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  Carotid Doppler unremarkable.  Incidental finding of enlarged vascularized lymph node adjacent to right CCA  2D Echo pending  LDL 99  HgbA1c 8.0  UDS negative  VTE prophylaxis - Heparin subcu  aspirin 81 mg daily prior to admission, now on  aspirin 325 mg daily and clopidogrel 75 mg daily DAPT.   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  Right VA high-grade stenosis versus hypoplastic  Likely the cause of patient posterior circulation infarcts  Discussed with Dr. Estanislado Pandy  Plan for cerebral angiogram for further evaluation on Monday  Avoid low BP, on IV fluid  BP goal 130-160 at the time being  AKI on CKD  Creatinine 2.23, near baseline.   On IV fluids @ 75  BMP monitoring  Encourage p.o. intake  Hypertension  Home meds:  Norvasc 10 mg Daily  Stable.  BP on the  lower side.   Continue IV fluid . Goal BP 130 to 160 mmHg at the time being  Hyperlipidemia  Home meds: Crestor 40 mg, resumed in hospital  LDL 99, goal < 70  Continue statin at discharge  Diabetes type II Uncontrolled  Home meds: Glipizide 5 mg BID  HgbA1c 8.0, goal < 7.0  CBGs   SSI  In inpatient  Outpatient close PCP follow-up for better DM control  Polycythemia vera rubra  CBC, hemoglobin, hematocrit stable.   Patient is on hydroxyurea 500 mg daily.    other Stroke Risk Factors  Advanced Age >/= 61   Other Active Problems  Hyperthyroidism - Last TSH on 05/12/19 is normal at 1.12  enlarged vascularized lymph node adjacent to right CCA - will discussed with primary team  [facility] day # 0   ATTENDING NOTE: I reviewed above note and agree with the assessment and plan. Pt was seen and examined.   80 year old female with history of diabetes, hypertension, hyperlipidemia, CKD admitted for imbalance for 3 days.  CT showed left cerebellar infarct.  MRI confirmed bilateral cerebellum, more on the left with right pontine infarct.  MRI showed basilar artery occlusion proximally with distal reconstitution, left PCA occlusion versus high-grade stenosis, right VA  high-grade stenosis versus hypoplastic.  2D echo pending.  Carotid Doppler unremarkable but incidental finding of enlarged vascularized lymph node adjacent to right CCA.  Creatinine 2.26 near baseline, LDL 99, A1c 8.0.  UDS negative  On exam, patient awake alert, orientated x3.  No aphasia, follows simple commands.  Able to name and repeat.  No gaze palsy, visual field full.  No significant facial droop, tongue midline.  Lateral upper extremity and lower extremity equal strength symmetrically.  Complaining of left upper extremity decreased light touch sensation.  Also has bilateral lower extremity heel-to-shin dysmetria.  Etiology for patient stroke likely due to posterior circulation occlusion/atherosclerosis, specifically basilar artery occlusion, left PCA and right VA high-grade stenosis.  Patient still has AKI on CKD, not good candidate for CTA head and neck.  Will give IV fluid and plan for cerebral angiogram on Monday with Dr. Estanislado Pandy.  Currently on aspirin 325 and Plavix 75 DAPT, continue Crestor 20.  Avoid low BP, BP goal at this time 130-160.  Will follow.  Rosalin Hawking, MD PhD Stroke Neurology 10/07/2020 5:05 PM  I discussed with Dr. Ree Kida and Dr. Estanislado Pandy. I spent  35 minutes in total face-to-face time with the patient, more than 50% of which was spent in counseling and coordination of care, reviewing test results, images and medication, and discussing the diagnosis, treatment plan and potential prognosis. This patient's care requiresreview of multiple databases, neurological assessment, discussion with family, other specialists and medical decision making of high complexity.   To contact Stroke Continuity provider, please refer to [email or web address] After hours, contact General Neurology

## 2020-10-07 NOTE — Evaluation (Signed)
Occupational Therapy Evaluation Patient Details Name: Brandi Mccormick MRN: [id number] DOB: [birth year] Today's Date: 10/07/2020    History of Present Illness Pt is a 80 y.o. female who presents with acute impaired balance and dizziness that began 2 days PTA along with a headache. CT concerning for acute infarct in L medial cerebellum. 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 PMH: DM, HTN, polycythemia vera, and hyperthyroidism.   Clinical Impression   Pt typically independent in ADL and mobility, enjoys flower gardening and her grandchildren, drives. Today presents as supervision for bed mobility, min guard for transfers, min A for in room mobility to bathroom for toilet transfer and min guard for peri care. Able to maintain standing at sink for grooming tasks at min guard (leans against sink for balance). Pt is right handed and notes some decreased sensation, but strength and coordination intact and overall functional for tasks observed today. At this time recommending CIR level therapy post-acute to maximize safety and independence in ADL and functional transfers and return Pt to independent LOF.   Follow Up Recommendations  CIR    Equipment Recommendations  3 in 1 bedside commode;Other (comment) (defer to next venue of care)    Recommendations for Other Services Rehab consult     Precautions / Restrictions Precautions Precautions: Fall Restrictions Weight Bearing Restrictions: No      Mobility Bed Mobility Overal bed mobility: Needs Assistance Bed Mobility: Supine to Sit;Sit to Supine     Supine to sit: Supervision Sit to supine: Supervision   General bed mobility comments: supervision for line management only    Transfers Overall transfer level: Needs assistance Equipment used: Rolling walker (2 wheeled) Transfers: Sit to/from  Stand Sit to Stand: Min assist         General transfer comment: vc for safe hand placement with RW, able to power up from bed and toilet without assist    Balance Overall balance assessment: Needs assistance Sitting-balance support: No upper extremity supported;Feet supported Sitting balance-Leahy Scale: Fair Sitting balance - Comments: able to display dynamic balance for donning socks while EOB   Standing balance support: No upper extremity supported;Bilateral upper extremity supported;During functional activity Standing balance-Leahy Scale: Poor Standing balance comment: dependent on at least one UE support, or leans against sink for balance during goroming tasks                           ADL either performed or assessed with clinical judgement   ADL Overall ADL's : Needs assistance/impaired Eating/Feeding: Set up;Sitting   Grooming: Wash/dry face;Oral care;Min guard;Standing Grooming Details (indicate cue type and reason): leans against sink for balance, Pt able to open toothpast with BUE, perform grooming and all the steps, required education for RW use and for safety due to decreased activity tolerance in BLE Upper Body Bathing: Set up;Sitting   Lower Body Bathing: Min guard;Sitting/lateral leans   Upper Body Dressing : Set up;Sitting   Lower Body Dressing: Minimal assistance;Sit to/from stand Lower Body Dressing Details (indicate cue type and reason): able to don/doff socks sitting EOB without assist Toilet Transfer: Minimal assistance;Ambulation;RW;Cueing for sequencing Toilet Transfer Details (indicate cue type and reason): cues for use of RW Toileting- Clothing Manipulation and Hygiene: Min guard;Sitting/lateral lean       Functional mobility during ADLs: Minimal assistance;Cueing for safety;Cueing for sequencing;Rolling walker General ADL Comments: Pt eager  to make progress and for education/compensatory strategies that will maximize independence      Vision Patient Visual Report: No change from baseline Vision Assessment?: No apparent visual deficits     Perception     Praxis      Pertinent Vitals/Pain Pain Assessment: No/denies pain     Hand Dominance Right   Extremity/Trunk Assessment Upper Extremity Assessment Upper Extremity Assessment: Overall WFL for tasks assessed   Lower Extremity Assessment Lower Extremity Assessment: Defer to PT evaluation RLE Deficits / Details: MMT scores: hip flexion 4-, knee extension 4, knee flexion 3+, ankle dorsiflexion 4+ RLE Sensation: WNL RLE Coordination: decreased gross motor (no dysdiadochokinesia or dysmetria noted) LLE Deficits / Details: MMT scores: hip flexion 4-, knee extension 4, knee flexion 3+, ankle dorsiflexion 4+ LLE Sensation: WNL LLE Coordination: decreased gross motor (no dysdiadochokinesia or dysmetria noted)   Cervical / Trunk Assessment Cervical / Trunk Assessment: Normal   Communication Communication Communication: No difficulties   Cognition Arousal/Alertness: Awake/alert Behavior During Therapy: WFL for tasks assessed/performed Overall Cognitive Status: Impaired/Different from baseline Area of Impairment: Safety/judgement;Problem solving                       Following Commands: Follows one step commands consistently;Follows one step commands with increased time Safety/Judgement: Decreased awareness of safety;Decreased awareness of deficits Awareness: Emergent Problem Solving: Difficulty sequencing;Requires verbal cues General Comments: Pt eager to improve and needs more education on how to safely use DME   General Comments  husband initially in the room    Exercises     Shoulder Instructions      Home Living Family/patient expects to be discharged to:: Private residence Living Arrangements: Spouse/significant other Available Help at Discharge: Family;Available 24 hours/day Type of Home: House Home Access: Stairs to enter [employer] of Steps: 1 Entrance Stairs-Rails: None Home Layout: One level     Bathroom Shower/Tub: Tub/shower unit (has walk-in shower also but she uses tub/shower)   Biochemist, clinical: Standard     Home Equipment: Hand held shower head      Lives With: Spouse    Prior Functioning/Environment Level of Independence: Independent        Comments: Pt independent with all functional mobility and ADLs. Pt drives. Retired. enjoys indoor flower gardening        OT Problem List: Decreased activity tolerance;Decreased strength;Impaired balance (sitting and/or standing);Decreased safety awareness;Decreased knowledge of use of DME or AE      OT Treatment/Interventions: Self-care/ADL training;DME and/or AE instruction;Therapeutic activities;Patient/family education;Balance training    OT Goals(Current goals can be found in the care plan section) Acute Rehab OT Goals Patient Stated Goal: be able to garden and walk independently OT Goal Formulation: With patient/family Time For Goal Achievement: 10/21/20 Potential to Achieve Goals: Good ADL Goals Pt Will Perform Grooming: with modified independence;standing Pt Will Perform Upper Body Dressing: with modified independence;sitting Pt Will Perform Lower Body Dressing: with modified independence;sit to/from stand Pt Will Transfer to Toilet: with modified independence;ambulating Pt Will Perform Toileting - Clothing Manipulation and hygiene: with modified independence;sit to/from stand Pt Will Perform Tub/Shower Transfer: Tub transfer;with supervision;with caregiver independent in assisting;ambulating;shower seat  OT Frequency: Min 2X/week   Barriers to D/C:            Co-evaluation              AM-PAC OT "6 Clicks" Daily Activity     Outcome Measure Help from another person eating meals?: None Help from another  person taking care of personal grooming?: A Little Help from another person toileting, which includes using toliet,  bedpan, or urinal?: A Little Help from another person bathing (including washing, rinsing, drying)?: A Little Help from another person to put on and taking off regular upper body clothing?: A Little Help from another person to put on and taking off regular lower body clothing?: A Little 6 Click Score: 19   End of Session Equipment Utilized During Treatment: Gait belt;Rolling walker Nurse Communication: Mobility status;Precautions  Activity Tolerance: Patient tolerated treatment well Patient left: in bed;with call bell/phone within reach;with bed alarm set  OT Visit Diagnosis: Unsteadiness on feet (R26.81);Other abnormalities of gait and mobility (R26.89);Muscle weakness (generalized) (M62.81)                Time: 0037-0488 OT Time Calculation (min): 25 min Charges:  OT General Charges $OT Visit: 1 Visit OT Evaluation $OT Eval Moderate Complexity: 1 Mod OT Treatments $Self Care/Home Management : 8-22 mins  Jesse Sans OTR/L Acute Rehabilitation Services Pager: [phone] Office: [place] 10/07/2020, 5:21 PM

## 2020-10-08 DIAGNOSIS — E7800 Pure hypercholesterolemia, unspecified: Secondary | ICD-10-CM

## 2020-10-08 DIAGNOSIS — N1832 Chronic kidney disease, stage 3b: Secondary | ICD-10-CM

## 2020-10-08 LAB — GLUCOSE, CAPILLARY
Glucose-Capillary: 164 mg/dL — ABNORMAL HIGH (ref 70–99)
Glucose-Capillary: 181 mg/dL — ABNORMAL HIGH (ref 70–99)
Glucose-Capillary: 109 mg/dL — ABNORMAL HIGH (ref 70–99)
Glucose-Capillary: 137 mg/dL — ABNORMAL HIGH (ref 70–99)

## 2020-10-08 LAB — ECHOCARDIOGRAM COMPLETE
Area-P 1/2: 2.6 cm2
S' Lateral: 2.1 cm

## 2020-10-08 NOTE — Consult Note (Signed)
Physical Medicine and Rehabilitation Consult Reason for Consult: Gait abnormality with dizziness Referring Physician: [facility]   HPI: Brandi Mccormick is a 80 y.o. right-handed female with history of shingles, diabetes mellitus, CKD stage IV, hyperlipidemia, hypertension.  Per chart review patient lives with spouse.  Independent and active prior to admission.  1 level home one-step to entry.  Presented to 12/20/2020 with unsteadiness of gait and dizziness x3 days.  Cranial CT scan showed focal low density seen involving the inferior portion of the left cerebellum medially concerning for acute infarction.  Patient did not receive TPA.  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  Heavily diseased distal right vertebral artery.  Carotid Dopplers with no ICA stenosis.  Echocardiogram with ejection fraction of 65 to 70% no wall motion abnormalities grade 1 diastolic dysfunction..  Diagnostic cerebral angiogram pending 10/09/2020.  Admission chemistries unremarkable except glucose 153 BUN 29 creatinine 2.26, hemoglobin A1c 8.0, urine drug screen negative.  Currently maintained on aspirin and Plavix for CVA prophylaxis.  Subcutaneous heparin for DVT prophylaxis.  Therapy evaluations completed recommendations of physical medicine rehab consult due to patient's decrease in functional mobility.   Review of Systems  Constitutional: Negative for fever.  HENT: Negative for hearing loss.   Eyes: Negative for blurred vision and double vision.  Respiratory: Negative for cough and shortness of breath.   Cardiovascular: Negative for chest pain, palpitations and leg swelling.  Gastrointestinal: Positive for constipation. Negative for heartburn, nausea and vomiting.       GERD  Genitourinary: Negative for dysuria, flank pain and hematuria.   Musculoskeletal: Positive for myalgias.  Skin: Negative for rash.  Neurological: Positive for dizziness and weakness.  All other systems reviewed and are negative.  Past Medical History:  Diagnosis Date  . Diabetes ([place])   . GERD (gastroesophageal reflux disease)   . Hyperlipidemia   . Hypertension   . Hyperthyroidism   . Pneumonia   . Shingles    Past Surgical History:  Procedure Laterality Date  . CATARACT EXTRACTION Left 2020  . COLONOSCOPY    . DENTAL SURGERY     Family History  Problem Relation Age of Onset  . Diabetes Sister   . Diabetes Brother   . Diabetes Sister   . Diabetes Mother        and on mother's side of family  . Colon cancer Mother   . Stroke Mother   . Diabetes Father        and on father's side of family  . Heart disease Neg Hx   . Rectal cancer Neg Hx   . Stomach cancer Neg Hx    Social History:  reports that she has never smoked. She has never used smokeless tobacco. She reports current alcohol use. She reports that she does not use drugs. Allergies:  Allergies  Allergen Reactions  . Penicillins Nausea And Vomiting    Has patient had a PCN reaction causing immediate rash, facial/tongue/throat swelling, SOB or lightheadedness with hypotension: no Has patient had a PCN reaction causing severe rash involving mucus membranes or skin necrosis: no Has patient had a PCN reaction that required hospitalization: no Has patient had a PCN reaction occurring within the last 10 years: no If all of the above answers are "NO", then may proceed with Cephalosporin use.    Medications Prior to Admission  Medication Sig  Dispense Refill  . amLODipine (NORVASC) 10 MG tablet Take 1 tablet by mouth once daily 90 tablet 0  . aspirin EC 81 MG tablet Take 81 mg by mouth daily.    . famotidine (PEPCID) 20 MG tablet Take 20 mg by mouth daily.    . furosemide (LASIX) 20 MG tablet Take 20 mg by mouth 2 (two) times daily as needed for fluid.    Marland Kitchen glipiZIDE (GLUCOTROL  XL) 5 MG 24 hr tablet Take 2 tablets (10 mg total) by mouth daily. 90 tablet 2  . hydroxyurea (HYDREA) 500 MG capsule Take 1 capsule by mouth once daily (Patient taking differently: Take 500 mg by mouth daily.) 90 capsule 0  . Lancets (ONETOUCH DELICA PLUS [id number]) MISC USE   TO CHECK GLUCOSE TWICE DAILY 100 each 11  . lisinopril (ZESTRIL) 40 MG tablet Take 1 tablet (40 mg total) by mouth daily. 90 tablet 1  . metoprolol succinate (TOPROL-XL) 25 MG 24 hr tablet Take 1 tablet by mouth once daily 90 tablet 0  . ONETOUCH VERIO test strip USE 1 STRIP TO CHECK GLUCOSE ONCE DAILY 50 each 11  . rosuvastatin (CRESTOR) 40 MG tablet Take 1 tablet by mouth once daily 90 tablet 0    Home: Home Living Family/patient expects to be discharged to:: Private residence Living Arrangements: Spouse/significant other Available Help at Discharge: Family,Available 24 hours/day Type of Home: House Home Access: Stairs to enter [employer] of Steps: 1 Entrance Stairs-Rails: None Home Layout: One level Bathroom Shower/Tub: Tub/shower unit (has walk-in shower also but she uses tub/shower) Biochemist, clinical: Standard Home Equipment: Hand held shower head  Lives With: Spouse  Functional History: Prior Function Level of Independence: Independent Comments: Pt independent with all functional mobility and ADLs. Pt drives. Retired. enjoys indoor flower gardening Functional Status:  Mobility: Bed Mobility Overal bed mobility: Needs Assistance Bed Mobility: Supine to Sit,Sit to Supine Supine to sit: Supervision Sit to supine: Supervision General bed mobility comments: supervision for line management only Transfers Overall transfer level: Needs assistance Equipment used: Rolling walker (2 wheeled) Transfers: Sit to/from Stand Sit to Stand: Min assist General transfer comment: vc for safe hand placement with RW, able to power up from bed and toilet without assist Ambulation/Gait Ambulation/Gait  assistance: Min assist,Mod assist Gait Distance (Feet): 30 Feet (x3 bouts of ~15 ft > ~15 ft > ~30 ft) Assistive device: None,1 person hand held assist,Rolling walker (2 wheeled) Gait Pattern/deviations: Step-through pattern,Decreased step length - right,Decreased step length - left,Decreased stride length,Wide base of support,Ataxic General Gait Details: Pt ambulates with ataxic gait pattern, reaching out for hand of therapist or object to hold onto often. Particular difficulty initiating leg movement at doorway when exiting bathroom. Min-modA for balance. Improved balance with use of RW final bout but pt psuhes RW distal to her and needs cues to remain midline within RW, minA for safety. Gait velocity: reduced Gait velocity interpretation: <1.31 ft/sec, indicative of household ambulator    ADL: ADL Overall ADL's : Needs assistance/impaired Eating/Feeding: Set up,Sitting Grooming: Wash/dry face,Oral care,Min guard,Standing Grooming Details (indicate cue type and reason): leans against sink for balance, Pt able to open toothpast with BUE, perform grooming and all the steps, required education for RW use and for safety due to decreased activity tolerance in BLE Upper Body Bathing: Set up,Sitting Lower Body Bathing: Min guard,Sitting/lateral leans Upper Body Dressing : Set up,Sitting Lower Body Dressing: Minimal assistance,Sit to/from stand Lower Body Dressing Details (indicate cue type and reason): able to don/doff  socks sitting EOB without assist Toilet Transfer: Minimal assistance,Ambulation,RW,Cueing for sequencing Toilet Transfer Details (indicate cue type and reason): cues for use of RW Toileting- Clothing Manipulation and Hygiene: Min guard,Sitting/lateral lean Functional mobility during ADLs: Minimal assistance,Cueing for safety,Cueing for sequencing,Rolling walker General ADL Comments: Pt eager to make progress and for education/compensatory strategies that will maximize  independence  Cognition: Cognition Overall Cognitive Status: Impaired/Different from baseline Arousal/Alertness: Awake/alert Orientation Level: Oriented X4 Memory: Impaired Memory Impairment: Decreased recall of new information Immediate Memory Recall: Sock,Blue,Bed Memory Recall Sock: Without Cue Memory Recall Blue: Without Cue Memory Recall Bed: With Cue Awareness: Appears intact Problem Solving: Appears intact Safety/Judgment: Appears intact Cognition Arousal/Alertness: Awake/alert Behavior During Therapy: WFL for tasks assessed/performed Overall Cognitive Status: Impaired/Different from baseline Area of Impairment: Safety/judgement,Problem solving Following Commands: Follows one step commands consistently,Follows one step commands with increased time Safety/Judgement: Decreased awareness of safety,Decreased awareness of deficits Awareness: Emergent Problem Solving: Difficulty sequencing,Requires verbal cues General Comments: Pt eager to improve and needs more education on how to safely use DME  Blood pressure 140/77, pulse 73, temperature 98.2 F (36.8 C), temperature source Oral, resp. rate 16, height 5\' 4"  (1.626 m), weight 60.3 kg, SpO2 100 %. Physical Exam Neurological:     Comments: Patient is awake alert no acute distress.  Makes eye contact with examiner.  Oriented x3 and follows commands.  Fair insight and awareness.     Results for orders placed or performed during the hospital encounter of 10/06/20 (from the past 24 hour(s))  Glucose, capillary     Status: None   Collection Time: 10/07/20  4:19 PM  Result Value Ref Range   Glucose-Capillary 88 70 - 99 mg/dL  Glucose, capillary     Status: Abnormal   Collection Time: 10/07/20  8:58 PM  Result Value Ref Range   Glucose-Capillary 169 (H) 70 - 99 mg/dL   Comment 1 Notify RN    Comment 2 Document in Chart   Glucose, capillary     Status: Abnormal   Collection Time: 10/08/20  6:56 AM  Result Value Ref Range    Glucose-Capillary 164 (H) 70 - 99 mg/dL   Comment 1 Notify RN    Comment 2 Document in Chart    DG Chest 2 View  Result Date: 10/06/2020 CLINICAL DATA:  80 year old female with dizziness. EXAM: CHEST - 2 VIEW COMPARISON:  Chest radiograph dated 06/24/2014 FINDINGS: No focal consolidation, pleural effusion or pneumothorax. The cardiac silhouette is within limits. Atherosclerotic calcification of the aorta. No acute osseous pathology. IMPRESSION: No active cardiopulmonary disease. Electronically Signed   By: Anner Crete M.D.   On: 10/06/2020 16:18   CT HEAD WO CONTRAST  Result Date: 10/06/2020 CLINICAL DATA:  Neuro deficit. EXAM: CT HEAD WITHOUT CONTRAST TECHNIQUE: Contiguous axial images were obtained from the base of the skull through the vertex without intravenous contrast. COMPARISON:  None. FINDINGS: Brain: Focal low density is seen involving the inferior portion of the left cerebellum medially concerning for acute infarction. Ventricular size is within normal limits. No midline shift is noted. No hemorrhage is noted. Vascular: No hyperdense vessel or unexpected calcification. Skull: Normal. Negative for fracture or focal lesion. Sinuses/Orbits: No acute finding. Other: None. IMPRESSION: Focal low density is seen involving the inferior portion of the left cerebellum medially concerning for acute infarction. MRI is recommended for further evaluation. Electronically Signed   By: Marijo Conception M.D.   On: 10/06/2020 12:48   MR ANGIO HEAD WO CONTRAST  Result Date:  10/06/2020 CLINICAL DATA:  Dizziness. EXAM: MRI HEAD WITHOUT CONTRAST MRA HEAD WITHOUT CONTRAST TECHNIQUE: Multiplanar, multiecho pulse sequences of the brain and surrounding structures were obtained without intravenous contrast. Angiographic images of the head were obtained using MRA technique without contrast. COMPARISON:  None. FINDINGS: MRI HEAD FINDINGS Brain: There are small acute bilateral cerebellar infarcts with the largest  measuring 2 cm medially in the left cerebellar hemisphere, and these are superimposed on left larger than right chronic cerebellar infarcts. There is also a subcentimeter acute infarct in the right pons with a chronic right paramedian pontine infarct noted as well. Chronic microhemorrhages are noted in the anterior right temporal lobe, left caudate nucleus, and left parietal lobe. T2 hyperintensities in the cerebral white matter bilaterally are nonspecific but compatible with mild chronic small vessel ischemic disease. There are chronic lacunar infarcts in the right thalamus and left genu of the corpus callosum. There is mild cerebral atrophy. Vascular: Abnormal appearance of the distal right vertebral artery and basilar artery as evaluated in greater detail below. Skull and upper cervical spine: Unremarkable bone marrow signal. Sinuses/Orbits: Left cataract extraction. Minimal mucosal thickening in the right ethmoid and right maxillary sinuses. Trace left mastoid fluid. Other: None. MRA HEAD FINDINGS The visualized portion of the distal left vertebral artery is patent and dominant. The distal right vertebral artery is small with evidence of irregular narrowing and reduced flow. 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 There are large right and moderate-sized left posterior communicating arteries. The right P1 segment is small. The left P1 segment is not visualized and may be absent or occluded. There is moderate irregularity and narrowing of distal right PCA branch vessels without evidence of a significant proximal right PCA stenosis. There is an attenuated appearance of the left PCA with visible flow in the left posterior communicating artery and proximal P2 segment, however minimal to no flow is apparent in the more distal left PCA. The internal carotid arteries are patent from skull base to carotid termini with mild  irregularity but no significant stenosis. The MCAs are patent without evidence of a proximal branch occlusion. There is mild irregular narrowing of the M1 and proximal M2 segments bilaterally. The right ACA is patent with a mild to moderate stenosis noted in the proximal A1 segment. There is a severe proximal left A1 stenosis, and there is occlusion of the left A2 segment proximally. No aneurysm is identified. IMPRESSION: 1. 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 2. Mild chronic small vessel ischemia in the cerebral white matter. 3. 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 4. Poor visualization of the left PCA beginning at the mid P2 level which may reflect occlusion or severely diminished flow. 5. Heavily diseased distal right vertebral artery. 6. 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 Electronically Signed   By: Logan Bores M.D.   On: 10/06/2020 16:09   MR BRAIN WO CONTRAST  Result Date: 10/06/2020 CLINICAL DATA:  Dizziness. EXAM: MRI HEAD WITHOUT CONTRAST MRA HEAD WITHOUT CONTRAST TECHNIQUE: Multiplanar, multiecho pulse sequences of the brain and surrounding structures were obtained without intravenous contrast. Angiographic images of the head were obtained using MRA technique without contrast. COMPARISON:  None. FINDINGS: MRI HEAD FINDINGS Brain: There are small acute bilateral cerebellar infarcts with the largest measuring 2 cm medially in the left cerebellar hemisphere, and these are superimposed on  left larger than right chronic cerebellar infarcts. There is also a subcentimeter acute infarct in the right pons with a chronic right paramedian pontine infarct noted as well. Chronic microhemorrhages are noted in the anterior right temporal lobe, left caudate nucleus, and left parietal lobe. T2 hyperintensities in the cerebral white matter bilaterally are nonspecific but compatible  with mild chronic small vessel ischemic disease. There are chronic lacunar infarcts in the right thalamus and left genu of the corpus callosum. There is mild cerebral atrophy. Vascular: Abnormal appearance of the distal right vertebral artery and basilar artery as evaluated in greater detail below. Skull and upper cervical spine: Unremarkable bone marrow signal. Sinuses/Orbits: Left cataract extraction. Minimal mucosal thickening in the right ethmoid and right maxillary sinuses. Trace left mastoid fluid. Other: None. MRA HEAD FINDINGS The visualized portion of the distal left vertebral artery is patent and dominant. The distal right vertebral artery is small with evidence of irregular narrowing and reduced flow. 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 There are large right and moderate-sized left posterior communicating arteries. The right P1 segment is small. The left P1 segment is not visualized and may be absent or occluded. There is moderate irregularity and narrowing of distal right PCA branch vessels without evidence of a significant proximal right PCA stenosis. There is an attenuated appearance of the left PCA with visible flow in the left posterior communicating artery and proximal P2 segment, however minimal to no flow is apparent in the more distal left PCA. The internal carotid arteries are patent from skull base to carotid termini with mild irregularity but no significant stenosis. The MCAs are patent without evidence of a proximal branch occlusion. There is mild irregular narrowing of the M1 and proximal M2 segments bilaterally. The right ACA is patent with a mild to moderate stenosis noted in the proximal A1 segment. There is a severe proximal left A1 stenosis, and there is occlusion of the left A2 segment proximally. No aneurysm is identified. IMPRESSION: 1. Small acute bilateral cerebellar and right pontine  infarcts superimposed on multiple chronic posterior circulation infarcts. 2. Mild chronic small vessel ischemia in the cerebral white matter. 3. 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 4. Poor visualization of the left PCA beginning at the mid P2 level which may reflect occlusion or severely diminished flow. 5. Heavily diseased distal right vertebral artery. 6. 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 Electronically Signed   By: Logan Bores M.D.   On: 10/06/2020 16:09   VAS US CAROTID (at [facility] and [facility] only)  Result Date: 10/07/2020 Carotid Arterial Duplex Study Indications:       CVA and Gait. Risk Factors:      Hypertension, hyperlipidemia, Diabetes. Other Factors:     Dizziness. Comparison Study:  No prior study Performing Technologist: Sharion Dove RVS  Examination Guidelines: A complete evaluation includes B-mode imaging, spectral Doppler, color Doppler, and power Doppler as needed of all accessible portions of each vessel. Bilateral testing is considered an integral part of a complete examination. Limited examinations for reoccurring indications may be performed as noted.  Right Carotid Findings: +----------+--------+--------+--------+------------------+--------+           PSV cm/sEDV cm/sStenosisPlaque DescriptionComments +----------+--------+--------+--------+------------------+--------+ CCA Prox  98      16              homogeneous                +----------+--------+--------+--------+------------------+--------+  CCA Distal71      20              heterogenous               +----------+--------+--------+--------+------------------+--------+ ICA Prox  76      22              heterogenous               +----------+--------+--------+--------+------------------+--------+ ICA Distal93      28                                         +----------+--------+--------+--------+------------------+--------+ ECA       172     7                                           +----------+--------+--------+--------+------------------+--------+ +----------+--------+-------+--------+-------------------+           PSV cm/sEDV cmsDescribeArm Pressure (mmHG) +----------+--------+-------+--------+-------------------+ [id number]                                         +----------+--------+-------+--------+-------------------+ +---------+--------+---+--------+--+ VertebralPSV cm/s120EDV cm/s32 +---------+--------+---+--------+--+  Left Carotid Findings: +----------+--------+--------+--------+---------------------+------------------+           PSV cm/sEDV cm/sStenosisPlaque Description   Comments           +----------+--------+--------+--------+---------------------+------------------+ CCA Prox  105     20                                   intimal thickening +----------+--------+--------+--------+---------------------+------------------+ CCA Distal76      19                                   intimal thickening +----------+--------+--------+--------+---------------------+------------------+ ICA Prox  150     27              calcific and                                                              irregular                               +----------+--------+--------+--------+---------------------+------------------+ ICA Mid   155     34                                                      +----------+--------+--------+--------+---------------------+------------------+ ICA Distal82      29                                                      +----------+--------+--------+--------+---------------------+------------------+  ECA       194     10                                                      +----------+--------+--------+--------+---------------------+------------------+ +----------+--------+--------+--------+-------------------+           PSV cm/sEDV cm/sDescribeArm Pressure (mmHG)  +----------+--------+--------+--------+-------------------+ [id number]                                          +----------+--------+--------+--------+-------------------+ +---------+--------+---+--------+--+ VertebralPSV cm/s161EDV cm/s28 +---------+--------+---+--------+--+   Summary: Right Carotid: Velocities in the right ICA are consistent with a 1-39% stenosis.                There is an enlarged, vascularized lymph node 1.52cm X 1.92 cm                noted adjacent to the right common carotid artery. Left Carotid: Velocities in the left ICA are consistent with a 1-39% stenosis. Vertebrals:  Bilateral vertebral arteries demonstrate antegrade flow. Subclavians: Normal flow hemodynamics were seen in bilateral subclavian              arteries. *See table(s) above for measurements and observations.     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 Does the need for close, 24 hr/day medical supervision in concert with the patient's rehab needs make it unreasonable for this patient to be served in a less intensive setting? No 2. Co-Morbidities requiring supervision/potential complications:   3. Due to bladder management, skin/wound care, pain management and patient education, does the patient require 24 hr/day rehab nursing? No 4. Does the patient require coordinated care of a physician, rehab nurse, therapy disciplines of n/a to address physical and functional deficits in the context of the above medical diagnosis(es)? No Addressing deficits in the following areas: balance, locomotion, bathing and dressing 5. Can the patient actively participate in an intensive therapy program of at least 3 hrs of therapy per day at least 5 days per week? Potentially 6. The potential for patient to make measurable gains while on inpatient rehab is fair 7. Anticipated functional outcomes upon discharge from inpatient rehab are n/a   with PT, n/a with OT, n/a with SLP. 8. Estimated rehab length of stay to reach the above functional goals is: n/a 9. Anticipated discharge destination: Home 10. Overall Rehab/Functional Prognosis: excellent  RECOMMENDATIONS: This patient's condition is appropriate for continued rehabilitative care in the following setting: Outpatient Therapy Patient has agreed to participate in recommended program. Yes and Potentially Note that insurance prior authorization may be required for reimbursement for recommended care.  Comment: Pt appears to be progessing nicely. Was supervision to min guard today with gait during PT. Recommend outpatient therapies.   Meredith Staggers, MD, [place] 10/09/2020    Lavon Paganini [place], PA-C 10/08/2020

## 2020-10-08 NOTE — Progress Notes (Signed)
Inpatient Rehab Admissions Coordinator Note:   Per therapy recommendations, pt was screened for CIR candidacy by Romy Ipock, MS CCC-SLP. At this time, Pt. Appears to have functional decline and is a good candidate for CIR. Will place order for rehab consult per protocol.  Please contact me with questions.   Amayrani Bennick, MS, CCC-SLP Rehab Admissions Coordinator  [phone] (celll) [phone] (office)  

## 2020-10-08 NOTE — Progress Notes (Signed)
PROGRESS NOTE    Brandi Mccormick  OEU:[id number] DOB: [birth year] DOA: 10/06/2020 PCP: Brandi Held, DO   Brief Narrative:  HPI On 10/06/2020  Brandi Mccormick is a 80 y.o. female with a medical history of essential hypertension, diabetes, GERD, who presented to the emergency department with complaints of dizziness.  Patient tells me that her legs simply are not working.  This started approximately 2 to 3 days ago.  Patient state and she also had a headache which resolved.  She feels that her legs are "drunk" and will go the right way.  Denies any recent illness, travel, sick contacts, chest pain, shortness of breath, abdominal pain, nausea or vomiting, diarrhea or constipation, problems with urination.  Patient does state that she did get the COVID-19 vaccine and booster.  Interim history Admitted with acute CVA.  Neurology consulted and appreciated.  IR consulted for image guided diagnostic cerebral angiogram on 2/7.  Therapy recommending CIR. Assessment & Plan   Acute CVA with dizziness -Patient experienced dizziness approximately 2 days ago and is still having issues with ambulation. -CT head showed focal low density involving inferior portion of the left cerebellum medially concerning for acute infarction. 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  Poor visualization of left PCA, may reflect occlusion or severely diminished flow.  Heavily diseased distal right vertebral artery.  Severe left and mild to moderate right A1 stenosis. -Echocardiogram pending -Carotid Doppler showed 1 to 39% bilateral ICA stenosis.  Bilateral vertebral arteries demonstrate antegrade flow. -LDL 99, hemoglobin A1c 8 -PT and OT consulted and recommended CIR- consult placed -Neurology consulted and appreciated-recommended continue  aspirin, Plavix, statin.  IR consult for cerebral angiogram which is pending for 10/09/2020. -Continue aspirin, statin, plavix added  Essential hypertension -Will continue to hold blood pressure medications and allow for permissive hypertension given possibility of acute CVA  Diabetes mellitus, type II -Glipizide Mccormick -Continue insulin sliding scale with CBG monitoring -Hemoglobin A1c 8  Chronic kidney disease, stage IV -Continue to monitor creatinine, appears to be close to baseline  Vascularized lymph node -Incidentally found on ultrasound of the carotids.  On the right, enlargement of 1.52 cm x 1.92 cm adjacent to the right common carotid artery. -Given chronic kidney disease, stage IV at baseline, cannot obtain contrasted study at this time -Patient has had no neck pain or issues swallowing -Discussed with oncology, Dr. Alvy Bimler, via phone, given no current symptoms would have patient follow-up with her primary care physician, no work-up needed at this time.  DVT Prophylaxis Heparin  Code Status: Full  Family Communication: None at bedside  Disposition Plan:  Status is: Inpatient  Remains inpatient appropriate because:Inpatient level of care appropriate due to severity of illness   Dispo: The patient is from: Home              Anticipated d/c is to: CIR              Anticipated d/c date is: 3 days              Patient currently is not medically stable to d/c.   Difficult to place patient No   Consultants Neurology Interventional radiology Oncology, via phone, [facility]  Procedures  Echocardiogram Carotid Doppler  Antibiotics   Anti-infectives (From admission, onward)   None      Subjective:   Brandi Mccormick  seen and examined today.  Patient with no complaints this morning.  Would like to go home.  Denies current chest pain, shortness breath, abdominal pain, nausea or vomiting, diarrhea or constipation, dizziness or headache. Objective:    Vitals:   10/07/20 2016 10/07/20 2341 10/08/20 0341 10/08/20 0746  BP: 115/60 122/67 128/65 140/77  Pulse: 79 78 69 73  Resp: 16 17 17 16   Temp: (!) 97.5 F (36.4 C) 98.1 F (36.7 C) 98.1 F (36.7 C) 98.2 F (36.8 C)  TempSrc: Oral Oral Oral Oral  SpO2: 98% 99% 99% 100%  Weight:      Height:       No intake or output data in the 24 hours ending 10/08/20 1105 Filed Weights   10/06/20 1737 10/06/20 2133  Weight: 56.2 kg 60.3 kg   Exam  General: Well developed, elderly, NAD  HEENT: NCAT,  mucous membranes moist.   Cardiovascular: S1 S2 auscultated, RRR  Respiratory: Clear to auscultation bilaterally with equal chest rise  Abdomen: Soft, nontender, nondistended, + bowel sounds  Extremities: warm dry without cyanosis clubbing or edema  Neuro: AAOx3, no new deficits  Psych: appropriate mood and affect, pleasant  Data Reviewed: I have personally reviewed following labs and imaging studies  CBC: Recent Labs  Lab 10/06/20 1214 10/06/20 1801 10/07/20 0222  WBC 7.6 8.0 6.6  NEUTROABS 5.4  --   --   HGB 13.5 15.0 12.3  HCT 42.2 45.0 36.0  MCV 100.5* 100.4* 97.3  PLT 504* 467* 828*   Basic Metabolic Panel: Recent Labs  Lab 10/06/20 1214 10/06/20 1801 10/07/20 0222  NA 138  --  137  K 4.2  --  3.9  CL 109  --  108  CO2 19*  --  20*  GLUCOSE 153*  --  222*  BUN 29*  --  32*  CREATININE 2.26* 2.11* 2.23*  CALCIUM 9.9  --  9.2   GFR: Estimated Creatinine Clearance: 17.7 mL/min (A) (by C-G formula based on SCr of 2.23 mg/dL (H)). Liver Function Tests: Recent Labs  Lab 10/06/20 1214  AST 20  ALT 19  ALKPHOS 59  BILITOT 0.9  PROT 7.1  ALBUMIN 3.7   No results for input(s): LIPASE, AMYLASE in the last 168 hours. No results for input(s): AMMONIA in the last 168 hours. Coagulation Profile: Recent Labs  Lab 10/06/20 1214  INR 1.0   Cardiac Enzymes: No results for input(s): CKTOTAL, CKMB, CKMBINDEX, TROPONINI in the last 168 hours. BNP (last 3  results) No results for input(s): PROBNP in the last 8760 hours. HbA1C: Recent Labs    10/07/20 0222  HGBA1C 8.0*   CBG: Recent Labs  Lab 10/07/20 0635 10/07/20 1131 10/07/20 1619 10/07/20 2058 10/08/20 0656  GLUCAP 149* 253* 88 169* 164*   Lipid Profile: Recent Labs    10/07/20 0222  CHOL 172  HDL 53  LDLCALC 99  TRIG 102  CHOLHDL 3.2   Thyroid Function Tests: No results for input(s): TSH, T4TOTAL, FREET4, T3FREE, THYROIDAB in the last 72 hours. Anemia Panel: No results for input(s): VITAMINB12, FOLATE, FERRITIN, TIBC, IRON, RETICCTPCT in the last 72 hours. Urine analysis:    Component Value Date/Time   COLORURINE STRAW (A) 06/02/2018 1715   APPEARANCEUR HAZY (A) 06/02/2018 1715   LABSPEC 1.010 06/02/2018 1715   PHURINE 6.0 06/02/2018 1715   GLUCOSEU 150 (A) 06/02/2018 1715   HGBUR SMALL (A) 06/02/2018 1715   BILIRUBINUR NEGATIVE 06/02/2018 1715   BILIRUBINUR neg 06/23/2015 1033  KETONESUR 5 (A) 06/02/2018 1715   PROTEINUR 100 (A) 06/02/2018 1715   UROBILINOGEN 0.2 06/23/2015 1033   UROBILINOGEN 1.0 10/23/2013 2011   NITRITE NEGATIVE 06/02/2018 1715   LEUKOCYTESUR TRACE (A) 06/02/2018 1715   Sepsis Labs: @LABRCNTIP (JMEQASTMHDQQI:2,LNLGXQJJHER:7)  ) Recent Results (from the past 240 hour(s))  SARS Coronavirus 2 by RT PCR (hospital order, performed in [facility] hospital lab) Nasopharyngeal Nasopharyngeal Swab     Status: None   Collection Time: 10/06/20  3:17 PM   Specimen: Nasopharyngeal Swab  Result Value Ref Range Status   SARS Coronavirus 2 NEGATIVE NEGATIVE Final    Comment: (NOTE) SARS-CoV-2 target nucleic acids are NOT DETECTED.  The SARS-CoV-2 RNA is generally detectable in upper and lower respiratory specimens during the acute phase of infection. The lowest concentration of SARS-CoV-2 viral copies this assay can detect is 250 copies / mL. A negative result does not preclude SARS-CoV-2 infection and should not be used as the sole basis for  treatment or other patient management decisions.  A negative result may occur with improper specimen collection / handling, submission of specimen other than nasopharyngeal swab, presence of viral mutation(s) within the areas targeted by this assay, and inadequate number of viral copies (<250 copies / mL). A negative result must be combined with clinical observations, patient history, and epidemiological information.  Fact Sheet for Patients:   StrictlyIdeas.no  Fact Sheet for Healthcare Providers: BankingDealers.co.za  This test is not yet approved or  cleared by the [place] FDA and has been authorized for detection and/or diagnosis of SARS-CoV-2 by FDA under an Emergency Use Authorization (EUA).  This EUA will remain in effect (meaning this test can be used) for the duration of the COVID-19 declaration under Section 564(b)(1) of the Act, 21 U.S.C. section 360bbb-3(b)(1), unless the authorization is terminated or revoked sooner.  Performed at [facility], [place] [address], [place], [place] [address]       Radiology Studies: DG Chest 2 View  Result Date: 10/06/2020 CLINICAL DATA:  80 year old female with dizziness. EXAM: CHEST - 2 VIEW COMPARISON:  Chest radiograph dated 06/24/2014 FINDINGS: No focal consolidation, pleural effusion or pneumothorax. The cardiac silhouette is within limits. Atherosclerotic calcification of the aorta. No acute osseous pathology. IMPRESSION: No active cardiopulmonary disease. Electronically Signed   By: Anner Crete M.D.   On: 10/06/2020 16:18   CT HEAD WO CONTRAST  Result Date: 10/06/2020 CLINICAL DATA:  Neuro deficit. EXAM: CT HEAD WITHOUT CONTRAST TECHNIQUE: Contiguous axial images were obtained from the base of the skull through the vertex without intravenous contrast. COMPARISON:  None. FINDINGS: Brain: Focal low density is seen involving the inferior portion of the left cerebellum  medially concerning for acute infarction. Ventricular size is within normal limits. No midline shift is noted. No hemorrhage is noted. Vascular: No hyperdense vessel or unexpected calcification. Skull: Normal. Negative for fracture or focal lesion. Sinuses/Orbits: No acute finding. Other: None. IMPRESSION: Focal low density is seen involving the inferior portion of the left cerebellum medially concerning for acute infarction. MRI is recommended for further evaluation. Electronically Signed   By: Marijo Conception M.D.   On: 10/06/2020 12:48   MR ANGIO HEAD WO CONTRAST  Result Date: 10/06/2020 CLINICAL DATA:  Dizziness. EXAM: MRI HEAD WITHOUT CONTRAST MRA HEAD WITHOUT CONTRAST TECHNIQUE: Multiplanar, multiecho pulse sequences of the brain and surrounding structures were obtained without intravenous contrast. Angiographic images of the head were obtained using MRA technique without contrast. COMPARISON:  None. FINDINGS: MRI HEAD  FINDINGS Brain: There are small acute bilateral cerebellar infarcts with the largest measuring 2 cm medially in the left cerebellar hemisphere, and these are superimposed on left larger than right chronic cerebellar infarcts. There is also a subcentimeter acute infarct in the right pons with a chronic right paramedian pontine infarct noted as well. Chronic microhemorrhages are noted in the anterior right temporal lobe, left caudate nucleus, and left parietal lobe. T2 hyperintensities in the cerebral white matter bilaterally are nonspecific but compatible with mild chronic small vessel ischemic disease. There are chronic lacunar infarcts in the right thalamus and left genu of the corpus callosum. There is mild cerebral atrophy. Vascular: Abnormal appearance of the distal right vertebral artery and basilar artery as evaluated in greater detail below. Skull and upper cervical spine: Unremarkable bone marrow signal. Sinuses/Orbits: Left cataract extraction. Minimal mucosal thickening in the right  ethmoid and right maxillary sinuses. Trace left mastoid fluid. Other: None. MRA HEAD FINDINGS The visualized portion of the distal left vertebral artery is patent and dominant. The distal right vertebral artery is small with evidence of irregular narrowing and reduced flow. 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 There are large right and moderate-sized left posterior communicating arteries. The right P1 segment is small. The left P1 segment is not visualized and may be absent or occluded. There is moderate irregularity and narrowing of distal right PCA branch vessels without evidence of a significant proximal right PCA stenosis. There is an attenuated appearance of the left PCA with visible flow in the left posterior communicating artery and proximal P2 segment, however minimal to no flow is apparent in the more distal left PCA. The internal carotid arteries are patent from skull base to carotid termini with mild irregularity but no significant stenosis. The MCAs are patent without evidence of a proximal branch occlusion. There is mild irregular narrowing of the M1 and proximal M2 segments bilaterally. The right ACA is patent with a mild to moderate stenosis noted in the proximal A1 segment. There is a severe proximal left A1 stenosis, and there is occlusion of the left A2 segment proximally. No aneurysm is identified. IMPRESSION: 1. 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 2. Mild chronic small vessel ischemia in the cerebral white matter. 3. 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 4. Poor visualization of the left PCA beginning at the mid P2 level which may reflect occlusion or severely diminished flow. 5. Heavily diseased distal right vertebral artery. 6. 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  Electronically Signed   By: Logan Bores M.D.   On: 10/06/2020 16:09   MR BRAIN WO CONTRAST  Result Date: 10/06/2020 CLINICAL DATA:  Dizziness. EXAM: MRI HEAD WITHOUT CONTRAST MRA HEAD WITHOUT CONTRAST TECHNIQUE: Multiplanar, multiecho pulse sequences of the brain and surrounding structures were obtained without intravenous contrast. Angiographic images of the head were obtained using MRA technique without contrast. COMPARISON:  None. FINDINGS: MRI HEAD FINDINGS Brain: There are small acute bilateral cerebellar infarcts with the largest measuring 2 cm medially in the left cerebellar hemisphere, and these are superimposed on left larger than right chronic cerebellar infarcts. There is also a subcentimeter acute infarct in the right pons with a chronic right paramedian pontine infarct noted as well. Chronic microhemorrhages are noted in the anterior right temporal lobe, left caudate nucleus, and left parietal lobe. T2 hyperintensities in  the cerebral white matter bilaterally are nonspecific but compatible with mild chronic small vessel ischemic disease. There are chronic lacunar infarcts in the right thalamus and left genu of the corpus callosum. There is mild cerebral atrophy. Vascular: Abnormal appearance of the distal right vertebral artery and basilar artery as evaluated in greater detail below. Skull and upper cervical spine: Unremarkable bone marrow signal. Sinuses/Orbits: Left cataract extraction. Minimal mucosal thickening in the right ethmoid and right maxillary sinuses. Trace left mastoid fluid. Other: None. MRA HEAD FINDINGS The visualized portion of the distal left vertebral artery is patent and dominant. The distal right vertebral artery is small with evidence of irregular narrowing and reduced flow. 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 There are large right and moderate-sized left posterior communicating  arteries. The right P1 segment is small. The left P1 segment is not visualized and may be absent or occluded. There is moderate irregularity and narrowing of distal right PCA branch vessels without evidence of a significant proximal right PCA stenosis. There is an attenuated appearance of the left PCA with visible flow in the left posterior communicating artery and proximal P2 segment, however minimal to no flow is apparent in the more distal left PCA. The internal carotid arteries are patent from skull base to carotid termini with mild irregularity but no significant stenosis. The MCAs are patent without evidence of a proximal branch occlusion. There is mild irregular narrowing of the M1 and proximal M2 segments bilaterally. The right ACA is patent with a mild to moderate stenosis noted in the proximal A1 segment. There is a severe proximal left A1 stenosis, and there is occlusion of the left A2 segment proximally. No aneurysm is identified. IMPRESSION: 1. 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 2. Mild chronic small vessel ischemia in the cerebral white matter. 3. 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 4. Poor visualization of the left PCA beginning at the mid P2 level which may reflect occlusion or severely diminished flow. 5. Heavily diseased distal right vertebral artery. 6. 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 Electronically Signed   By: Logan Bores M.D.   On: 10/06/2020 16:09   VAS US CAROTID (at [facility] and [facility] only)  Result Date: 10/07/2020 Carotid Arterial Duplex Study Indications:       CVA and Gait. Risk Factors:      Hypertension, hyperlipidemia, Diabetes. Other Factors:     Dizziness. Comparison Study:  No prior study Performing Technologist: Sharion Dove RVS  Examination Guidelines: A complete evaluation includes B-mode imaging, spectral Doppler, color Doppler, and power  Doppler as needed of all accessible portions of each vessel. Bilateral testing is considered an integral part of a complete examination. Limited examinations for reoccurring indications may be performed as noted.  Right Carotid Findings: +----------+--------+--------+--------+------------------+--------+           PSV cm/sEDV cm/sStenosisPlaque DescriptionComments +----------+--------+--------+--------+------------------+--------+ CCA Prox  98      16              homogeneous                +----------+--------+--------+--------+------------------+--------+ CCA Distal71      20              heterogenous               +----------+--------+--------+--------+------------------+--------+ ICA Prox  76  22              heterogenous               +----------+--------+--------+--------+------------------+--------+ ICA Distal93      28                                         +----------+--------+--------+--------+------------------+--------+ ECA       172     7                                          +----------+--------+--------+--------+------------------+--------+ +----------+--------+-------+--------+-------------------+           PSV cm/sEDV cmsDescribeArm Pressure (mmHG) +----------+--------+-------+--------+-------------------+ [id number]                                         +----------+--------+-------+--------+-------------------+ +---------+--------+---+--------+--+ VertebralPSV cm/s120EDV cm/s32 +---------+--------+---+--------+--+  Left Carotid Findings: +----------+--------+--------+--------+---------------------+------------------+           PSV cm/sEDV cm/sStenosisPlaque Description   Comments           +----------+--------+--------+--------+---------------------+------------------+ CCA Prox  105     20                                   intimal thickening  +----------+--------+--------+--------+---------------------+------------------+ CCA Distal76      19                                   intimal thickening +----------+--------+--------+--------+---------------------+------------------+ ICA Prox  150     27              calcific and                                                              irregular                               +----------+--------+--------+--------+---------------------+------------------+ ICA Mid   155     34                                                      +----------+--------+--------+--------+---------------------+------------------+ ICA Distal82      29                                                      +----------+--------+--------+--------+---------------------+------------------+ ECA       194     10                                                      +----------+--------+--------+--------+---------------------+------------------+ +----------+--------+--------+--------+-------------------+  PSV cm/sEDV cm/sDescribeArm Pressure (mmHG) +----------+--------+--------+--------+-------------------+ [id number]                                          +----------+--------+--------+--------+-------------------+ +---------+--------+---+--------+--+ VertebralPSV cm/s161EDV cm/s28 +---------+--------+---+--------+--+   Summary: Right Carotid: Velocities in the right ICA are consistent with a 1-39% stenosis.                There is an enlarged, vascularized lymph node 1.52cm X 1.92 cm                noted adjacent to the right common carotid artery. Left Carotid: Velocities in the left ICA are consistent with a 1-39% stenosis. Vertebrals:  Bilateral vertebral arteries demonstrate antegrade flow. Subclavians: Normal flow hemodynamics were seen in bilateral subclavian              arteries. *See table(s) above for measurements and observations.     Preliminary       Scheduled Meds: .  stroke: mapping our early stages of recovery book   Does not apply Once  . aspirin EC  325 mg Oral Daily  . clopidogrel  75 mg Oral Daily  . famotidine  20 mg Oral Daily  . heparin  5,000 Units Subcutaneous Q8H  . hydroxyurea  500 mg Oral Daily  . insulin aspart  0-5 Units Subcutaneous QHS  . insulin aspart  0-9 Units Subcutaneous TID WC  . rosuvastatin  40 mg Oral Daily  . sodium chloride flush  3 mL Intravenous Once   Continuous Infusions: . sodium chloride 75 mL/hr at 10/08/20 0017     LOS: 1 day   Time Spent in minutes   45 minutes  Brandi Mccormick D.O. on 10/08/2020 at 11:05 AM  Between 7am to 7pm - Please see pager noted on [email or web address]  After 7pm go to [email or web address]  And look for the night coverage person covering for me after hours  [facility] Office  [phone]

## 2020-10-08 NOTE — Progress Notes (Signed)
STROKE TEAM PROGRESS NOTE   INTERVAL HISTORY  Husband at the bedside. Pt sitting in chair, not complains. Pending cerebral angiogram tomorrow. PT/OT recommend CIR.   Vitals:   10/07/20 2016 10/07/20 2341 10/08/20 0341 10/08/20 0746  BP: 115/60 122/67 128/65 140/77  Pulse: 79 78 69 73  Resp: 16 17 17 16   Temp: (!) 97.5 F (36.4 C) 98.1 F (36.7 C) 98.1 F (36.7 C) 98.2 F (36.8 C)  TempSrc: Oral Oral Oral Oral  SpO2: 98% 99% 99% 100%  Weight:      Height:       CBC:  Recent Labs  Lab 10/06/20 1214 10/06/20 1801 10/07/20 0222  WBC 7.6 8.0 6.6  NEUTROABS 5.4  --   --   HGB 13.5 15.0 12.3  HCT 42.2 45.0 36.0  MCV 100.5* 100.4* 97.3  PLT 504* 467* 366*   Basic Metabolic Panel:  Recent Labs  Lab 10/06/20 1214 10/06/20 1801 10/07/20 0222  NA 138  --  137  K 4.2  --  3.9  CL 109  --  108  CO2 19*  --  20*  GLUCOSE 153*  --  222*  BUN 29*  --  32*  CREATININE 2.26* 2.11* 2.23*  CALCIUM 9.9  --  9.2   Lipid Panel:  Recent Labs  Lab 10/07/20 0222  CHOL 172  TRIG 102  HDL 53  CHOLHDL 3.2  VLDL 20  LDLCALC 99   HgbA1c:  Recent Labs  Lab 10/07/20 0222  HGBA1C 8.0*   Urine Drug Screen:  Recent Labs  Lab 10/06/20 2250  LABOPIA NONE DETECTED  COCAINSCRNUR NONE DETECTED  LABBENZ NONE DETECTED  AMPHETMU NONE DETECTED  THCU NONE DETECTED  LABBARB NONE DETECTED    Alcohol Level No results for input(s): ETH in the last 168 hours.  IMAGING past 24 hours  VAS US CAROTID (at [facility] and [facility] only)  10/07/2020 Summary:  Right Carotid: Velocities in the right ICA are consistent with a 1-39% stenosis. There is an enlarged, vascularized lymph node 1.52cm X 1.92 cm noted adjacent to the right common carotid artery.  Left Carotid: Velocities in the left ICA are consistent with a 1-39% stenosis.  Vertebrals:  Bilateral vertebral arteries demonstrate antegrade flow.  Subclavians: Normal flow hemodynamics were seen in bilateral subclavian arteries.  Preliminary      PHYSICAL EXAM GENERAL: Awake, alert in NAD HEENT: - Normocephalic and atraumatic LUNGS - Symmetrical chest rise, No labored breathing noted CV - no JVD, No Peripheral Edema ABDOMEN - Soft,  nondistended  Ext: warm, well perfused, intact peripheral pulses, no Peripheral edema  NEURO Exam:   Mental Status: AA&Ox4 Oriented to self, age, place, situation, Good Attention and Concentration Language: speech is Fluent.  Pt is able to name simple objects, repetition intact,  fluency, and comprehension intact. Cranial Nerves:   CN II Pupils equal and reactive to light, no VF deficits   CN III,IV,VI EOM intact, no gaze preference or deviation, no nystagmus    CN V normal sensation in V1, V2, and V3 segments bilaterally    CN VII no asymmetry, no nasolabial fold flattening    CN VIII normal hearing to speech    CN IX & X normal palatal elevation, no uvular deviation    CN XI 5/5 head turn and 5/5 shoulder shrug bilaterally    CN XII midline tongue protrusion    Motor: No Drift in Upper Extremities, No Drift in LE's. Strength 5/5 in Rt UE, 5/5 in Lt  UE, Strength in Rt LE  5/5, Lt LE 5/5 Reflexes- UE -2+  , LE-  2+ Tone: is normal and bulk is normal. Sensation- Mildly decreased sensation to light touch on Rt UE.  Coordination: FTN intact bilaterally, bilateral LE heel-to-shin dysmetria. Gait- deferred   ASSESSMENT/PLAN Brandi Mccormick is a 80 y.o. female with history of DM, GERD, HLD, HTN, hyperthyroidism presenting with balance issues and unsteady feeling for 3 days. CT brain shows focal low-density infarct in left cerebellum, MRI revealed bilateral cerebellar and right pontine infarcts. tPA was not given as she was outside time window.  MRA shows poor visualization of left PCA beginning at mid P2 level which may reflect occlusion, proximal occlusion of the basilar artery with distal reconstitution.  HbA1c 8, Urine tox negative, lipid profile within normal limits.  ECHO was done  yesterday results pending.  Vascular ultrasound scheduled.  Patient is on Aspirin 325 and Plavix 75 mg.  PT Eval pending. Talked to Dr. Estanislado Pandy, he recommended for IR Cerebral Angio which is scheduled for Monday.   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  MRI  Small acute bilateral cerebellar and right pontine infarcts superimposed on multiple chronic posterior circulation infarcts.    MRA - Proximal occlusion of the basilar artery with distal Reconstitution. Poor visualization of the left PCA beginning at the mid P2 level which may reflect occlusion or severely diminished flow. Heavily diseased distal right vertebral artery. 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  Carotid Doppler unremarkable.  Incidental finding of enlarged vascularized lymph node adjacent to right CCA  2D Echo EF 65-70%  LDL 99  HgbA1c 8.0  UDS negative  VTE prophylaxis - Heparin subcu  aspirin 81 mg daily prior to admission, now on aspirin 325 mg daily and clopidogrel 75 mg daily DAPT.   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  Right VA high-grade stenosis versus hypoplastic  Likely the cause of patient posterior circulation infarcts  Discussed with Dr. Estanislado Pandy  Plan for cerebral angiogram for further evaluation on Monday   Avoid low BP, on IV fluid  BP goal 130-160 at the time being  AKI on CKD  Creatinine 2.26->2.23, near baseline.   On IV fluids @ 75  BMP monitoring  Encourage p.o. intake  Hypertension  Home meds:  Norvasc 10 mg Daily  Stable.  BP on the lower side.   Continue IV fluid . Goal BP 130 to 160 mmHg at the time  being  Hyperlipidemia  Home meds: Crestor 40 mg, resumed in hospital  LDL 99, goal < 70  Continue statin at discharge   Diabetes type II Uncontrolled  Home meds: Glipizide 5 mg BID  HgbA1c 8.0, goal < 7.0  CBGs   SSI   close PCP follow-up for better DM control  Polycythemia vera rubra  CBC, hemoglobin, hematocrit stable.   Patient is on hydroxyurea 500 mg daily.    Other Stroke Risk Factors  Advanced Age >/= 86   Other Active Problems  Hyperthyroidism - Last TSH on 05/12/19 is normal at 1.12  Enlarged vascularized lymph node adjacent to right CCA - per Dr. Ree Kida, oncology curbsided, will follow up as outpt   GERD  Hospital day # 1   Brandi Hawking, MD PhD Stroke Neurology 10/08/2020 3:52 PM     To contact Stroke Continuity provider, please refer to [email or web address] After hours, contact General Neurology

## 2020-10-09 ENCOUNTER — Inpatient Hospital Stay (HOSPITAL_COMMUNITY): Payer: Medicare HMO

## 2020-10-09 DIAGNOSIS — E7800 Pure hypercholesterolemia, unspecified: Secondary | ICD-10-CM | POA: Diagnosis not present

## 2020-10-09 DIAGNOSIS — E785 Hyperlipidemia, unspecified: Secondary | ICD-10-CM | POA: Diagnosis not present

## 2020-10-09 DIAGNOSIS — I10 Essential (primary) hypertension: Secondary | ICD-10-CM | POA: Diagnosis not present

## 2020-10-09 DIAGNOSIS — R42 Dizziness and giddiness: Secondary | ICD-10-CM | POA: Diagnosis not present

## 2020-10-09 DIAGNOSIS — I6322 Cerebral infarction due to unspecified occlusion or stenosis of basilar arteries: Secondary | ICD-10-CM | POA: Diagnosis not present

## 2020-10-09 DIAGNOSIS — E1151 Type 2 diabetes mellitus with diabetic peripheral angiopathy without gangrene: Secondary | ICD-10-CM | POA: Diagnosis not present

## 2020-10-09 DIAGNOSIS — I6623 Occlusion and stenosis of bilateral posterior cerebral arteries: Secondary | ICD-10-CM | POA: Diagnosis not present

## 2020-10-09 DIAGNOSIS — I6602 Occlusion and stenosis of left middle cerebral artery: Secondary | ICD-10-CM | POA: Diagnosis not present

## 2020-10-09 DIAGNOSIS — E1165 Type 2 diabetes mellitus with hyperglycemia: Secondary | ICD-10-CM | POA: Diagnosis not present

## 2020-10-09 DIAGNOSIS — N1832 Chronic kidney disease, stage 3b: Secondary | ICD-10-CM | POA: Diagnosis not present

## 2020-10-09 DIAGNOSIS — I639 Cerebral infarction, unspecified: Secondary | ICD-10-CM | POA: Diagnosis not present

## 2020-10-09 HISTORY — PX: IR ANGIO VERTEBRAL SEL SUBCLAVIAN INNOMINATE UNI L MOD SED: IMG5364

## 2020-10-09 HISTORY — PX: IR ANGIO VERTEBRAL SEL VERTEBRAL BILAT MOD SED: IMG5369

## 2020-10-09 HISTORY — PX: IR ANGIO INTRA EXTRACRAN SEL COM CAROTID INNOMINATE BILAT MOD SED: IMG5360

## 2020-10-09 LAB — COMPREHENSIVE METABOLIC PANEL
ALT: 13 U/L (ref 0–44)
AST: 14 U/L — ABNORMAL LOW (ref 15–41)
Albumin: 2.7 g/dL — ABNORMAL LOW (ref 3.5–5.0)
Alkaline Phosphatase: 43 U/L (ref 38–126)
Anion gap: 7 (ref 5–15)
BUN: 25 mg/dL — ABNORMAL HIGH (ref 8–23)
CO2: 19 mmol/L — ABNORMAL LOW (ref 22–32)
Calcium: 8.6 mg/dL — ABNORMAL LOW (ref 8.9–10.3)
Chloride: 115 mmol/L — ABNORMAL HIGH (ref 98–111)
Creatinine, Ser: 2.05 mg/dL — ABNORMAL HIGH (ref 0.44–1.00)
GFR, Estimated: 24 mL/min — ABNORMAL LOW (ref >60)
Glucose, Bld: 138 mg/dL — ABNORMAL HIGH (ref 70–99)
Potassium: 4 mmol/L (ref 3.5–5.1)
Sodium: 141 mmol/L (ref 135–145)
Total Bilirubin: 0.7 mg/dL (ref 0.3–1.2)
Total Protein: 5.3 g/dL — ABNORMAL LOW (ref 6.5–8.1)

## 2020-10-09 LAB — GLUCOSE, CAPILLARY
Glucose-Capillary: 150 mg/dL — ABNORMAL HIGH (ref 70–99)
Glucose-Capillary: 136 mg/dL — ABNORMAL HIGH (ref 70–99)
Glucose-Capillary: 234 mg/dL — ABNORMAL HIGH (ref 70–99)

## 2020-10-09 LAB — APTT: aPTT: 57 seconds — ABNORMAL HIGH (ref 24–36)

## 2020-10-09 LAB — CBC WITH DIFFERENTIAL/PLATELET
Abs Immature Granulocytes: 0.03 10*3/uL (ref 0.00–0.07)
Basophils Absolute: 0 10*3/uL (ref 0.0–0.1)
Basophils Relative: 1 %
Eosinophils Absolute: 0.1 10*3/uL (ref 0.0–0.5)
Eosinophils Relative: 2 %
HCT: 32.6 % — ABNORMAL LOW (ref 36.0–46.0)
Hemoglobin: 11.2 g/dL — ABNORMAL LOW (ref 12.0–15.0)
Immature Granulocytes: 1 %
Lymphocytes Relative: 41 %
Lymphs Abs: 2.3 10*3/uL (ref 0.7–4.0)
MCH: 33.8 pg (ref 26.0–34.0)
MCHC: 34.4 g/dL (ref 30.0–36.0)
MCV: 98.5 fL (ref 80.0–100.0)
Monocytes Absolute: 0.7 10*3/uL (ref 0.1–1.0)
Monocytes Relative: 13 %
Neutro Abs: 2.4 10*3/uL (ref 1.7–7.7)
Neutrophils Relative %: 42 %
Platelets: 378 10*3/uL (ref 150–400)
RBC: 3.31 MIL/uL — ABNORMAL LOW (ref 3.87–5.11)
RDW: 15.4 % (ref 11.5–15.5)
WBC: 5.5 10*3/uL (ref 4.0–10.5)
nRBC: 0 % (ref 0.0–0.2)

## 2020-10-09 LAB — PROTIME-INR
INR: 1 (ref 0.8–1.2)
Prothrombin Time: 12.8 seconds (ref 11.4–15.2)

## 2020-10-09 MED ORDER — VERAPAMIL HCL 2.5 MG/ML IV SOLN
INTRAVENOUS | Status: AC
  Filled 2020-10-09: qty 2

## 2020-10-09 MED ORDER — VERAPAMIL HCL 2.5 MG/ML IV SOLN: INTRA_ARTERIAL | Status: AC | PRN

## 2020-10-09 MED ORDER — FENTANYL CITRATE (PF) 100 MCG/2ML IJ SOLN
INTRAMUSCULAR | Status: AC | PRN
  Administered 2020-10-09: 25 ug via INTRAVENOUS

## 2020-10-09 MED ORDER — IOHEXOL 300 MG/ML  SOLN: 25.0000 mL | Freq: Once | INTRAMUSCULAR | Status: DC | PRN

## 2020-10-09 MED ORDER — HEPARIN SODIUM (PORCINE) 1000 UNIT/ML IJ SOLN
INTRAMUSCULAR | Status: AC
  Filled 2020-10-09: qty 1

## 2020-10-09 MED ORDER — FENTANYL CITRATE (PF) 100 MCG/2ML IJ SOLN
INTRAMUSCULAR | Status: AC
  Filled 2020-10-09: qty 2

## 2020-10-09 MED ORDER — NITROGLYCERIN 1 MG/10 ML FOR IR/CATH LAB
INTRA_ARTERIAL | Status: AC
  Filled 2020-10-09: qty 10

## 2020-10-09 MED ORDER — NITROGLYCERIN 1 MG/10 ML FOR IR/CATH LAB
INTRA_ARTERIAL | Status: AC | PRN
  Administered 2020-10-09: 200 ug via INTRA_ARTERIAL

## 2020-10-09 MED ORDER — HEPARIN SODIUM (PORCINE) 1000 UNIT/ML IJ SOLN
INTRAMUSCULAR | Status: AC | PRN
  Administered 2020-10-09: 2000 [IU] via INTRA_ARTERIAL

## 2020-10-09 MED ORDER — CLOPIDOGREL BISULFATE 75 MG PO TABS: 75.0000 mg | ORAL_TABLET | Freq: Every day | ORAL | 3 refills | Status: DC

## 2020-10-09 MED ORDER — ASPIRIN 325 MG PO TBEC: 325.0000 mg | DELAYED_RELEASE_TABLET | Freq: Every day | ORAL | 2 refills | Status: DC

## 2020-10-09 MED ORDER — MIDAZOLAM HCL 2 MG/2ML IJ SOLN
INTRAMUSCULAR | Status: AC | PRN
  Administered 2020-10-09: 1 mg via INTRAVENOUS

## 2020-10-09 MED ORDER — LIDOCAINE HCL 1 % IJ SOLN
INTRAMUSCULAR | Status: AC
  Filled 2020-10-09: qty 20

## 2020-10-09 MED ORDER — SODIUM CHLORIDE 0.9 % IV SOLN: INTRAVENOUS | Status: AC

## 2020-10-09 MED ORDER — MIDAZOLAM HCL 2 MG/2ML IJ SOLN
INTRAMUSCULAR | Status: AC
  Filled 2020-10-09: qty 2

## 2020-10-09 MED ORDER — LIDOCAINE HCL (PF) 1 % IJ SOLN
INTRAMUSCULAR | Status: AC | PRN
  Administered 2020-10-09: 30 mL

## 2020-10-09 MED ORDER — IOHEXOL 300 MG/ML  SOLN
150.0000 mL | Freq: Once | INTRAMUSCULAR | Status: AC | PRN
  Administered 2020-10-09: 75 mL via INTRA_ARTERIAL

## 2020-10-09 NOTE — Progress Notes (Signed)
PT Cancellation Note  Patient Details Name: TAITE BALDASSARI MRN: [id number] DOB: 1941/04/27   Cancelled Treatment:    Reason Eval/Treat Not Completed: Patient at procedure or test/unavailable Pt off floor at angiogram. Will follow up as time allows.   Marguarite Arbour A Nathon Stefanski 10/09/2020, 8:35 AM Marisa Severin, PT, DPT Acute Rehabilitation Services Pager [phone] Office [phone]

## 2020-10-09 NOTE — Progress Notes (Signed)
OT Cancellation Note  Patient Details Name: Brandi Mccormick MRN: [id number] DOB: [birth year]   Cancelled Treatment:    Reason Eval/Treat Not Completed: Patient at procedure or test/ unavailable. OT will check back as time allows.   Gloris Manchester OTR/L Supplemental OT, Department of rehab services [phone]  Darreon Lutes R H. 10/09/2020, 9:55 AM

## 2020-10-09 NOTE — Procedures (Signed)
S/P 4 vessel cerebral artreriogram RT rad approach. Findings. 1.Occluded RT VA prox with distal reconstitution at C2 from Lt ascending cervial artrery. 2.Occluded Lr VA prox with distal reconstitution at C2 from ascending cervical br of Lt thyrovervical cervical  Trunk. 3.occluded basilar artery just distal to AICAS.  4.RT PCOM fills both PCAS and SCAs from RT ICA. S.Manpreet Kemmer MD

## 2020-10-09 NOTE — Progress Notes (Signed)
NIR.  History of acute CVA (bilateral cerebellar and right pontine infarcts secondary to basilar artery occlusion) s/p diagnostic cerebral arteriogram today with Dr. Estanislado Pandy revealing occluded bilateral proximal VAs and basilar artery (with right PCOM filling both PCAs and SCAs from right ICA).  Plan for follow-up with Dr. Estanislado Pandy with CTA head/neck (with contrast) in 3 months- NIR schedulers to call patient to set up this imaging scan. Further plans per [facility]/neurology- appreciate and agree with management. Please call NIR with questions/concerns.   Bea Graff Louk, PA-C 10/09/2020, 10:33 AM

## 2020-10-09 NOTE — Plan of Care (Signed)
Min assist adls

## 2020-10-09 NOTE — Discharge Summary (Signed)
Physician Discharge Summary  Brandi Mccormick FFM:[id number] DOB: 1941/07/16 DOA: 10/06/2020  PCP: [name], DO  Admit date: 10/06/2020 Discharge date: 10/09/2020  Time spent: 45 minutes  Recommendations for Outpatient Follow-up:  Patient will be discharged to home with outpatient therapy.  Patient will need to follow up with primary care provider within one week of discharge.  Follow up with neurology. Follow up with interventional radiology in 3 months. Patient should continue medications as prescribed.  Patient should follow a heart healthy/carb modified diet.   Discharge Diagnoses:  Acute CVA with dizziness Essential hypertension Diabetes mellitus, type II Chronic kidney disease, stage IV Vascularized lymph node  Discharge Condition: Stable  Diet recommendation: heart healthy/carb modified  Filed Weights   10/06/20 1737 10/06/20 2133  Weight: 56.2 kg 60.3 kg    History of present illness:  HPI On 10/06/2020  Brandi Holdman Mitchellis a 80 y.o.femalewith a medical history ofessential hypertension, diabetes, GERD, who presented to the emergency department with complaints of dizziness. Patient tells me that her legs simply are not working. This started approximately 2 to 3 days ago. Patient state and she also had a headache which resolved. She feels that her legs are "drunk" and will go the right way. Denies any recent illness, travel, sick contacts, chest pain, shortness of breath, abdominal pain, nausea or vomiting, diarrhea or constipation, problems with urination. Patient does state that she did get theCOVID-19vaccine and booster.  Hospital Course:  Acute CVA with dizziness -Patient experienced dizziness approximately 2 days ago and is still having issues with ambulation. -CT head showed focal low density involving inferior portion of the left cerebellum medially concerning for acute infarction. -MRI brain showed small acute bilateral cerebellar and right pontine  infarct superimposed on multiple chronic posterior circulation infarcts. 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  Poor visualization of left PCA, may reflect occlusion or severely diminished flow.  Heavily diseased distal right vertebral artery.  Severe left and mild to moderate right A1 stenosis. -Echocardiogram pending -Carotid Doppler showed 1 to 39% bilateral ICA stenosis.  Bilateral vertebral arteries demonstrate antegrade flow. -LDL 99, hemoglobin A1c 8 -PT and OT consulted and recommended CIR- consult placed- however today patient was able to ambulate with physical therapy and they are now recommending outpatient PT. -Neurology consulted and appreciated-recommended continue aspirin, Plavix, statin. 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 -Continue aspirin, statin, plavix added -Discussed with neurology, Dr.Xu, recommended aspirin 325 mg daily along with Plavix 75 mg daily for 3 months followed by Plavix 25 mg daily alone.  Patient will need to follow-up with interventional radiology as well as neurology.  Essential hypertension -Will continue to hold blood pressure medications and allow for permissive hypertension given possibility of acute CVA  Diabetes mellitus, type II -Glipizide held- resume on discharge -patient will need to follow up with her PCP for titer DM management -Hemoglobin A1c 8  Chronic kidney disease, stage IV -Continue to monitor creatinine, appears to be close to baseline  Vascularized lymph node -Incidentally found on ultrasound of the carotids.  On the right, enlargement of 1.52 cm x 1.92 cm adjacent to the right common carotid artery. -Given chronic kidney disease, stage IV at baseline, cannot obtain contrasted study at this time -Patient has had no neck pain or issues swallowing -Discussed with oncology,  Dr. Alvy Bimler, via phone, given no current symptoms would  have patient follow-up with her primary care physician, no work-up needed at this time.  Consultants Neurology Interventional radiology Oncology, via phone, [facility]  Procedures  Echocardiogram Carotid Doppler  Discharge Exam: Vitals:   10/09/20 1018 10/09/20 1139  BP: (!) 141/66 (!) 156/75  Pulse: 67 73  Resp: 18 16  Temp: 97.6 F (36.4 C) 98 F (36.7 C)  SpO2: 100% 100%     General: Well developed, thin, elderly, NAD  HEENT: NCAT, mucous membranes moist.  Cardiovascular: S1 S2 auscultated, RRR  Respiratory: Clear to auscultation bilaterally with equal chest rise  Abdomen: Soft, nontender, nondistended, + bowel sounds  Extremities: warm dry without cyanosis clubbing or edema  Neuro: AAOx3, nonfocal, no dysmetria  Psych: pleasant, appropriate mood and affect  Discharge Instructions Discharge Instructions    Ambulatory referral to Occupational Therapy   Complete by: As directed    Ambulatory referral to Physical Therapy   Complete by: As directed    Ambulatory referral to Speech Therapy   Complete by: As directed    Discharge instructions   Complete by: As directed    Patient will be discharged to home with outpatient therapy.  Patient will need to follow up with primary care provider within one week of discharge discussed diabetes management as well as follow-up on lymph node.  Follow up with neurology. Follow up with interventional radiology in 3 months. Patient should continue medications as prescribed.  Patient should follow a heart healthy/carb modified diet.  You will take aspirin and Plavix together for 3 months after that only Plavix alone.  Check your blood pressure and keep the systolic blood pressure between 130 and 150.   Increase activity slowly   Complete by: As directed    No wound care   Complete by: As directed      Allergies as of 10/09/2020      Reactions    Penicillins Nausea And Vomiting   Has patient had a PCN reaction causing immediate rash, facial/tongue/throat swelling, SOB or lightheadedness with hypotension: no Has patient had a PCN reaction causing severe rash involving mucus membranes or skin necrosis: no Has patient had a PCN reaction that required hospitalization: no Has patient had a PCN reaction occurring within the last 10 years: no If all of the above answers are "NO", then may proceed with Cephalosporin use.      Medication List    TAKE these medications   amLODipine 10 MG tablet Commonly known as: NORVASC Take 1 tablet by mouth once daily   aspirin 325 MG EC tablet Take 1 tablet (325 mg total) by mouth daily. Start taking on: October 10, 2020 What changed:   medication strength  how much to take   clopidogrel 75 MG tablet Commonly known as: PLAVIX Take 1 tablet (75 mg total) by mouth daily. Start taking on: October 10, 2020   famotidine 20 MG tablet Commonly known as: PEPCID Take 20 mg by mouth daily.   furosemide 20 MG tablet Commonly known as: LASIX Take 20 mg by mouth 2 (two) times daily as needed for fluid.   glipiZIDE 5 MG 24 hr tablet Commonly known as: GLUCOTROL XL Take 2 tablets (10 mg total) by mouth daily.   hydroxyurea 500 MG capsule Commonly known as: HYDREA Take 1 capsule by mouth once daily   lisinopril 40 MG tablet Commonly known as: ZESTRIL Take 1 tablet (40 mg total) by mouth daily.   metoprolol succinate 25 MG 24 hr tablet Commonly known as:  TOPROL-XL Take 1 tablet by mouth once daily   OneTouch Delica Plus [id number] Misc USE   TO CHECK GLUCOSE TWICE DAILY   OneTouch Verio test strip Generic drug: glucose blood USE 1 STRIP TO CHECK GLUCOSE ONCE DAILY   rosuvastatin 40 MG tablet Commonly known as: CRESTOR Take 1 tablet by mouth once daily      Allergies  Allergen Reactions  . Penicillins Nausea And Vomiting    Has patient had a PCN reaction causing immediate rash,  facial/tongue/throat swelling, SOB or lightheadedness with hypotension: no Has patient had a PCN reaction causing severe rash involving mucus membranes or skin necrosis: no Has patient had a PCN reaction that required hospitalization: no Has patient had a PCN reaction occurring within the last 10 years: no If all of the above answers are "NO", then may proceed with Cephalosporin use.     Follow-up Information    Luanne Bras, MD Follow up in 3 month(s).   Specialties: Interventional Radiology, Radiology Why: Please follow-up with Dr. Estanislado Pandy with CTA head/neck 3 months after discharge. Our office will call you to set up this imaging scan. Contact information: [place] [address] [phone]        [facility] Follow up.   Specialty: Rehabilitation Why: The outpatient therapy will contact you for the first appointment. Contact information: [address] [place] [id number] [place] [address] [place], [place], DO. Schedule an appointment as soon as possible for a visit in 1 week(s).   Specialty: Family Medicine Why: Hospital follow-up Contact information: [address] [place] [address] [phone]        [facility]. Schedule an appointment as soon as possible for a visit in 4 week(s).   Specialty: Neurology Why: Stroke clinic Contact information: [address] [place] [phone]               The results of significant diagnostics from this hospitalization (including imaging, microbiology, ancillary and laboratory) are listed below for reference.    Significant Diagnostic Studies: DG Chest 2 View  Result Date: 10/06/2020 CLINICAL DATA:  80 year old female with dizziness. EXAM: CHEST - 2 VIEW COMPARISON:  Chest radiograph dated 06/24/2014 FINDINGS: No focal consolidation, pleural  effusion or pneumothorax. The cardiac silhouette is within limits. Atherosclerotic calcification of the aorta. No acute osseous pathology. IMPRESSION: No active cardiopulmonary disease. Electronically Signed   By: Anner Crete M.D.   On: 10/06/2020 16:18   CT HEAD WO CONTRAST  Result Date: 10/06/2020 CLINICAL DATA:  Neuro deficit. EXAM: CT HEAD WITHOUT CONTRAST TECHNIQUE: Contiguous axial images were obtained from the base of the skull through the vertex without intravenous contrast. COMPARISON:  None. FINDINGS: Brain: Focal low density is seen involving the inferior portion of the left cerebellum medially concerning for acute infarction. Ventricular size is within normal limits. No midline shift is noted. No hemorrhage is noted. Vascular: No hyperdense vessel or unexpected calcification. Skull: Normal. Negative for fracture or focal lesion. Sinuses/Orbits: No acute finding. Other: None. IMPRESSION: Focal low density is seen involving the inferior portion of the left cerebellum medially concerning for acute infarction. MRI is recommended for further evaluation. Electronically Signed   By: Marijo Conception M.D.   On: 10/06/2020 12:48   MR ANGIO HEAD WO CONTRAST  Result Date: 10/06/2020 CLINICAL DATA:  Dizziness. EXAM: MRI HEAD WITHOUT CONTRAST MRA HEAD WITHOUT CONTRAST TECHNIQUE: Multiplanar,  multiecho pulse sequences of the brain and surrounding structures were obtained without intravenous contrast. Angiographic images of the head were obtained using MRA technique without contrast. COMPARISON:  None. FINDINGS: MRI HEAD FINDINGS Brain: There are small acute bilateral cerebellar infarcts with the largest measuring 2 cm medially in the left cerebellar hemisphere, and these are superimposed on left larger than right chronic cerebellar infarcts. There is also a subcentimeter acute infarct in the right pons with a chronic right paramedian pontine infarct noted as well. Chronic microhemorrhages are noted in the  anterior right temporal lobe, left caudate nucleus, and left parietal lobe. T2 hyperintensities in the cerebral white matter bilaterally are nonspecific but compatible with mild chronic small vessel ischemic disease. There are chronic lacunar infarcts in the right thalamus and left genu of the corpus callosum. There is mild cerebral atrophy. Vascular: Abnormal appearance of the distal right vertebral artery and basilar artery as evaluated in greater detail below. Skull and upper cervical spine: Unremarkable bone marrow signal. Sinuses/Orbits: Left cataract extraction. Minimal mucosal thickening in the right ethmoid and right maxillary sinuses. Trace left mastoid fluid. Other: None. MRA HEAD FINDINGS The visualized portion of the distal left vertebral artery is patent and dominant. The distal right vertebral artery is small with evidence of irregular narrowing and reduced flow. 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 There are large right and moderate-sized left posterior communicating arteries. The right P1 segment is small. The left P1 segment is not visualized and may be absent or occluded. There is moderate irregularity and narrowing of distal right PCA branch vessels without evidence of a significant proximal right PCA stenosis. There is an attenuated appearance of the left PCA with visible flow in the left posterior communicating artery and proximal P2 segment, however minimal to no flow is apparent in the more distal left PCA. The internal carotid arteries are patent from skull base to carotid termini with mild irregularity but no significant stenosis. The MCAs are patent without evidence of a proximal branch occlusion. There is mild irregular narrowing of the M1 and proximal M2 segments bilaterally. The right ACA is patent with a mild to moderate stenosis noted in the proximal A1 segment. There is a severe proximal  left A1 stenosis, and there is occlusion of the left A2 segment proximally. No aneurysm is identified. IMPRESSION: 1. 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 2. Mild chronic small vessel ischemia in the cerebral white matter. 3. 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 4. Poor visualization of the left PCA beginning at the mid P2 level which may reflect occlusion or severely diminished flow. 5. Heavily diseased distal right vertebral artery. 6. 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 Electronically Signed   By: Logan Bores M.D.   On: 10/06/2020 16:09   MR BRAIN WO CONTRAST  Result Date: 10/06/2020 CLINICAL DATA:  Dizziness. EXAM: MRI HEAD WITHOUT CONTRAST MRA HEAD WITHOUT CONTRAST TECHNIQUE: Multiplanar, multiecho pulse sequences of the brain and surrounding structures were obtained without intravenous contrast. Angiographic images of the head were obtained using MRA technique without contrast. COMPARISON:  None. FINDINGS: MRI HEAD FINDINGS Brain: There are small acute bilateral cerebellar infarcts with the largest measuring 2 cm medially in the left cerebellar hemisphere, and these are superimposed on left larger than right chronic cerebellar infarcts. There is also a subcentimeter acute infarct in the  right pons with a chronic right paramedian pontine infarct noted as well. Chronic microhemorrhages are noted in the anterior right temporal lobe, left caudate nucleus, and left parietal lobe. T2 hyperintensities in the cerebral white matter bilaterally are nonspecific but compatible with mild chronic small vessel ischemic disease. There are chronic lacunar infarcts in the right thalamus and left genu of the corpus callosum. There is mild cerebral atrophy. Vascular: Abnormal appearance of the distal right vertebral artery and basilar artery as evaluated in greater detail below. Skull and  upper cervical spine: Unremarkable bone marrow signal. Sinuses/Orbits: Left cataract extraction. Minimal mucosal thickening in the right ethmoid and right maxillary sinuses. Trace left mastoid fluid. Other: None. MRA HEAD FINDINGS The visualized portion of the distal left vertebral artery is patent and dominant. The distal right vertebral artery is small with evidence of irregular narrowing and reduced flow. 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 There are large right and moderate-sized left posterior communicating arteries. The right P1 segment is small. The left P1 segment is not visualized and may be absent or occluded. There is moderate irregularity and narrowing of distal right PCA branch vessels without evidence of a significant proximal right PCA stenosis. There is an attenuated appearance of the left PCA with visible flow in the left posterior communicating artery and proximal P2 segment, however minimal to no flow is apparent in the more distal left PCA. The internal carotid arteries are patent from skull base to carotid termini with mild irregularity but no significant stenosis. The MCAs are patent without evidence of a proximal branch occlusion. There is mild irregular narrowing of the M1 and proximal M2 segments bilaterally. The right ACA is patent with a mild to moderate stenosis noted in the proximal A1 segment. There is a severe proximal left A1 stenosis, and there is occlusion of the left A2 segment proximally. No aneurysm is identified. IMPRESSION: 1. 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 2. Mild chronic small vessel ischemia in the cerebral white matter. 3. 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 4. Poor visualization of the left PCA beginning at the mid P2 level which may reflect occlusion  or severely diminished flow. 5. Heavily diseased distal right vertebral artery. 6. 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 Electronically Signed   By: Logan Bores M.D.   On: 10/06/2020 16:09   ECHOCARDIOGRAM COMPLETE  Result Date: 10/08/2020    ECHOCARDIOGRAM REPORT   Patient Name:   Brandi Mccormick Date of Exam: 10/06/2020 Medical Rec #:  [id number]        Height:       64.0 in Accession #:    [id number]       Weight:       127.6 lb Date of Birth:  [birth year]        BSA:          1.616 m Patient Age:    27 years         BP:           133/77 mmHg Patient Gender: F                HR:           69 bpm. Exam Location:  Inpatient Procedure: 2D Echo Indications:    stroke 434.91  History:  Patient has prior history of Echocardiogram examinations. Risk                 Factors:Diabetes, Hypertension and Dyslipidemia.  Sonographer:    Johny Chess Referring Phys: [id number] [place]  1. Left ventricular ejection fraction, by estimation, is 65 to 70%. The left ventricle has normal function. The left ventricle has no regional wall motion abnormalities. Left ventricular diastolic parameters are consistent with Grade I diastolic dysfunction (impaired relaxation).  2. Right ventricular systolic function is normal. The right ventricular size is normal. There is normal pulmonary artery systolic pressure.  3. The mitral valve is normal in structure. Trivial mitral valve regurgitation. No evidence of mitral stenosis.  4. The aortic valve is normal in structure. Aortic valve regurgitation is not visualized. No aortic stenosis is present.  5. The inferior vena cava is normal in size with greater than 50% respiratory variability, suggesting right atrial pressure of 3 mmHg. Conclusion(s)/Recommendation(s): No intracardiac source of embolism detected on this transthoracic study. A transesophageal echocardiogram is recommended to exclude cardiac source of embolism if clinically indicated. FINDINGS   Left Ventricle: Left ventricular ejection fraction, by estimation, is 65 to 70%. The left ventricle has normal function. The left ventricle has no regional wall motion abnormalities. The left ventricular internal cavity size was normal in size. There is  no left ventricular hypertrophy. Left ventricular diastolic parameters are consistent with Grade I diastolic dysfunction (impaired relaxation). Normal left ventricular filling pressure. Right Ventricle: The right ventricular size is normal. No increase in right ventricular wall thickness. Right ventricular systolic function is normal. There is normal pulmonary artery systolic pressure. The tricuspid regurgitant velocity is 2.12 m/s, and  with an assumed right atrial pressure of 3 mmHg, the estimated right ventricular systolic pressure is 33.0 mmHg. Left Atrium: Left atrial size was normal in size. Right Atrium: Right atrial size was normal in size. Pericardium: There is no evidence of pericardial effusion. Mitral Valve: The mitral valve is normal in structure. Trivial mitral valve regurgitation. No evidence of mitral valve stenosis. Tricuspid Valve: The tricuspid valve is normal in structure. Tricuspid valve regurgitation is mild . No evidence of tricuspid stenosis. Aortic Valve: The aortic valve is normal in structure. Aortic valve regurgitation is not visualized. No aortic stenosis is present. Pulmonic Valve: The pulmonic valve was normal in structure. Pulmonic valve regurgitation is not visualized. No evidence of pulmonic stenosis. Aorta: The aortic root is normal in size and structure. Venous: The inferior vena cava is normal in size with greater than 50% respiratory variability, suggesting right atrial pressure of 3 mmHg. IAS/Shunts: No atrial level shunt detected by color flow Doppler.  LEFT VENTRICLE PLAX 2D LVIDd:         3.40 cm Diastology LVIDs:         2.10 cm LV e' medial:    7.51 cm/s LV PW:         0.90 cm LV E/e' medial:  8.2 LV IVS:        0.90 cm LV  e' lateral:   9.68 cm/s                        LV E/e' lateral: 6.3  RIGHT VENTRICLE             IVC RV S prime:     12.00 cm/s  IVC diam: 1.00 cm TAPSE (M-mode): 1.8 cm LEFT ATRIUM  Index       RIGHT ATRIUM           Index LA diam:        3.20 cm 1.98 cm/m  RA Area:     10.00 cm LA Vol (A2C):   11.2 ml 6.93 ml/m  RA Volume:   20.40 ml  12.62 ml/m LA Vol (A4C):   22.5 ml 13.92 ml/m LA Biplane Vol: 16.3 ml 10.09 ml/m  AORTIC VALVE LVOT Vmax:   90.60 cm/s LVOT Vmean:  61.200 cm/s LVOT VTI:    0.193 m  AORTA Ao Root diam: 2.50 cm Ao Asc diam:  2.70 cm MITRAL VALVE               TRICUSPID VALVE MV Area (PHT): 2.60 cm    TR Peak grad:   18.0 mmHg MV Decel Time: 292 msec    TR Vmax:        212.00 cm/s MV E velocity: 61.30 cm/s MV A velocity: 88.70 cm/s  SHUNTS MV E/A ratio:  0.69        Systemic VTI: 0.19 m Ena Dawley MD Electronically signed by Ena Dawley MD Signature Date/Time: 10/08/2020/3:08:22 PM    Final    VAS US CAROTID (at [facility] and [facility] only)  Result Date: 10/08/2020 Carotid Arterial Duplex Study Indications:       CVA and Gait. Risk Factors:      Hypertension, hyperlipidemia, Diabetes. Other Factors:     Dizziness. Comparison Study:  No prior study Performing Technologist: Sharion Dove RVS  Examination Guidelines: A complete evaluation includes B-mode imaging, spectral Doppler, color Doppler, and power Doppler as needed of all accessible portions of each vessel. Bilateral testing is considered an integral part of a complete examination. Limited examinations for reoccurring indications may be performed as noted.  Right Carotid Findings: +----------+--------+--------+--------+------------------+--------+           PSV cm/sEDV cm/sStenosisPlaque DescriptionComments +----------+--------+--------+--------+------------------+--------+ CCA Prox  98      16              homogeneous                +----------+--------+--------+--------+------------------+--------+ CCA Distal71       20              heterogenous               +----------+--------+--------+--------+------------------+--------+ ICA Prox  76      22              heterogenous               +----------+--------+--------+--------+------------------+--------+ ICA Distal93      28                                         +----------+--------+--------+--------+------------------+--------+ ECA       172     7                                          +----------+--------+--------+--------+------------------+--------+ +----------+--------+-------+--------+-------------------+           PSV cm/sEDV cmsDescribeArm Pressure (mmHG) +----------+--------+-------+--------+-------------------+ [id number]                                         +----------+--------+-------+--------+-------------------+ +---------+--------+---+--------+--+  VertebralPSV cm/s120EDV cm/s32 +---------+--------+---+--------+--+  Left Carotid Findings: +----------+--------+--------+--------+---------------------+------------------+           PSV cm/sEDV cm/sStenosisPlaque Description   Comments           +----------+--------+--------+--------+---------------------+------------------+ CCA Prox  105     20                                   intimal thickening +----------+--------+--------+--------+---------------------+------------------+ CCA Distal76      19                                   intimal thickening +----------+--------+--------+--------+---------------------+------------------+ ICA Prox  150     27              calcific and                                                              irregular                               +----------+--------+--------+--------+---------------------+------------------+ ICA Mid   155     34                                                      +----------+--------+--------+--------+---------------------+------------------+ ICA Distal82       29                                                      +----------+--------+--------+--------+---------------------+------------------+ ECA       194     10                                                      +----------+--------+--------+--------+---------------------+------------------+ +----------+--------+--------+--------+-------------------+           PSV cm/sEDV cm/sDescribeArm Pressure (mmHG) +----------+--------+--------+--------+-------------------+ [id number]                                          +----------+--------+--------+--------+-------------------+ +---------+--------+---+--------+--+ VertebralPSV cm/s161EDV cm/s28 +---------+--------+---+--------+--+   Summary: Right Carotid: Velocities in the right ICA are consistent with a 1-39% stenosis.                There is an enlarged, vascularized lymph node 1.52cm X 1.92 cm                noted adjacent to the right common carotid artery. Left Carotid: Velocities in the left ICA are consistent with a 1-39% stenosis. Vertebrals:  Bilateral vertebral arteries demonstrate antegrade flow. Subclavians: Normal flow hemodynamics were seen in bilateral subclavian  arteries. *See table(s) above for measurements and observations.  Electronically signed by Ena Dawley MD on 10/08/2020 at 3:09:22 PM.    Final     Microbiology: Recent Results (from the past 240 hour(s))  SARS Coronavirus 2 by RT PCR (hospital order, performed in [facility] hospital lab) Nasopharyngeal Nasopharyngeal Swab     Status: None   Collection Time: 10/06/20  3:17 PM   Specimen: Nasopharyngeal Swab  Result Value Ref Range Status   SARS Coronavirus 2 NEGATIVE NEGATIVE Final    Comment: (NOTE) SARS-CoV-2 target nucleic acids are NOT DETECTED.  The SARS-CoV-2 RNA is generally detectable in upper and lower respiratory specimens during the acute phase of infection. The lowest concentration of SARS-CoV-2 viral copies this assay can  detect is 250 copies / mL. A negative result does not preclude SARS-CoV-2 infection and should not be used as the sole basis for treatment or other patient management decisions.  A negative result may occur with improper specimen collection / handling, submission of specimen other than nasopharyngeal swab, presence of viral mutation(s) within the areas targeted by this assay, and inadequate number of viral copies (<250 copies / mL). A negative result must be combined with clinical observations, patient history, and epidemiological information.  Fact Sheet for Patients:   StrictlyIdeas.no  Fact Sheet for Healthcare Providers: BankingDealers.co.za  This test is not yet approved or  cleared by the [place] FDA and has been authorized for detection and/or diagnosis of SARS-CoV-2 by FDA under an Emergency Use Authorization (EUA).  This EUA will remain in effect (meaning this test can be used) for the duration of the COVID-19 declaration under Section 564(b)(1) of the Act, 21 U.S.C. section 360bbb-3(b)(1), unless the authorization is terminated or revoked sooner.  Performed at [facility], [place] [address], [place], [place] [address]      Labs: Basic Metabolic Panel: Recent Labs  Lab 10/06/20 1214 10/06/20 1801 10/07/20 0222 10/09/20 0058  NA 138  --  137 141  K 4.2  --  3.9 4.0  CL 109  --  108 115*  CO2 19*  --  20* 19*  GLUCOSE 153*  --  222* 138*  BUN 29*  --  32* 25*  CREATININE 2.26* 2.11* 2.23* 2.05*  CALCIUM 9.9  --  9.2 8.6*   Liver Function Tests: Recent Labs  Lab 10/06/20 1214 10/09/20 0058  AST 20 14*  ALT 19 13  ALKPHOS 59 43  BILITOT 0.9 0.7  PROT 7.1 5.3*  ALBUMIN 3.7 2.7*   No results for input(s): LIPASE, AMYLASE in the last 168 hours. No results for input(s): AMMONIA in the last 168 hours. CBC: Recent Labs  Lab 10/06/20 1214 10/06/20 1801 10/07/20 0222 10/09/20 0058  WBC 7.6 8.0 6.6  5.5  NEUTROABS 5.4  --   --  2.4  HGB 13.5 15.0 12.3 11.2*  HCT 42.2 45.0 36.0 32.6*  MCV 100.5* 100.4* 97.3 98.5  PLT 504* 467* 454* 378   Cardiac Enzymes: No results for input(s): CKTOTAL, CKMB, CKMBINDEX, TROPONINI in the last 168 hours. BNP: BNP (last 3 results) No results for input(s): BNP in the last 8760 hours.  ProBNP (last 3 results) No results for input(s): PROBNP in the last 8760 hours.  CBG: Recent Labs  Lab 10/08/20 1258 10/08/20 1607 10/08/20 2118 10/09/20 0625 10/09/20 1145  GLUCAP 181* 109* 137* 150* 136*       Signed:  Taha Dimond  [facility] 10/09/2020, 1:29 PM

## 2020-10-09 NOTE — Progress Notes (Signed)
Physical Therapy Treatment Patient Details Name: Brandi Mccormick MRN: [id number] DOB: Jan 27, 1941 Today's Date: 10/09/2020    History of Present Illness Pt is a 80 y.o. female who presents with acute impaired balance and dizziness that began 2 days PTA along with a headache. CT concerning for acute infarct in L medial cerebellum. 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 s/p cerebral arteriogram 10/09/20. PMH: DM, HTN, polycythemia vera, and hyperthyroidism.    PT Comments    Patient progressing well towards PT goals. Tolerated transfers and gait training with Min guard progressing to supervision for safety. Tolerated some higher level balance challenges- head turns, direction changes, stepping over objects with only mild deviations in gait but no overt LOB. Reports no dizziness with activity. Pt reports feeling close to baseline with regards to mobility; spouse agrees. Tolerated stair training with supervision for safety. Noted to have mild balance impairments. Discharge recommendation updated to neuro OPPT as pt progressing well enough to return home with support of spouse.  Will follow.   Follow Up Recommendations  Outpatient PT (neuro)     Equipment Recommendations  None recommended by PT    Recommendations for Other Services       Precautions / Restrictions Precautions Precautions: Fall Restrictions Weight Bearing Restrictions: No    Mobility  Bed Mobility Overal bed mobility: Needs Assistance Bed Mobility: Supine to Sit;Sit to Supine     Supine to sit: Modified independent (Device/Increase time);HOB elevated Sit to supine: Modified independent (Device/Increase time);HOB elevated   General bed mobility comments: no assist needed, did not use RUE due to band still present from arteriogram.  Transfers Overall transfer level: Needs  assistance Equipment used: None Transfers: Sit to/from Stand Sit to Stand: Min guard         General transfer comment: Min guard for safety. Able to stand without pushing through UEs. Stood from [employer].  Ambulation/Gait Ambulation/Gait assistance: Min guard;Supervision Gait Distance (Feet): 200 Feet Assistive device: None Gait Pattern/deviations: Step-through pattern;Decreased stride length;Drifts right/left Gait velocity: reduced   General Gait Details: Slow, mildly unsteady gait with some drifting noted in both directions but no overt LOB. Reports she does better walking in shoes. Able to perform some higher level balance challenges- see balance section.   Stairs Stairs: Yes Stairs assistance: Supervision Stair Management: Alternating pattern;Step to pattern;One rail Left Number of Stairs: 5 (x2 bouts) General stair comments: Cues for safety/technique.   Wheelchair Mobility    Modified Rankin (Stroke Patients Only) Modified Rankin (Stroke Patients Only) Pre-Morbid Rankin Score: No symptoms Modified Rankin: Moderate disability     Balance Overall balance assessment: Needs assistance Sitting-balance support: Feet supported;No upper extremity supported Sitting balance-Leahy Scale: Good     Standing balance support: During functional activity Standing balance-Leahy Scale: Fair Standing balance comment: Does not require UE support or external support for dynamic tasks today             High level balance activites: Head turns;Sudden stops;Turns;Direction changes High Level Balance Comments: Tolerated above with only mild deviations in gait but no overt LOB.            Cognition Arousal/Alertness: Awake/alert Behavior During Therapy: WFL for tasks assessed/performed Overall Cognitive Status: No family/caregiver present to determine baseline cognitive functioning Area of Impairment: Safety/judgement  Safety/Judgement:  Decreased awareness of deficits     General Comments: Reports feeling close to baseline with no deficits. Per visitor present in room, he reports that is how her walking looks at baseline.      Exercises      General Comments General comments (skin integrity, edema, etc.): Spouse present in room.      Pertinent Vitals/Pain Pain Assessment: No/denies pain    Home Living                      Prior Function            PT Goals (current goals can now be found in the care plan section) Progress towards PT goals: Progressing toward goals    Frequency    Min 4X/week      PT Plan Discharge plan needs to be updated;Equipment recommendations need to be updated    Co-evaluation              AM-PAC PT "6 Clicks" Mobility   Outcome Measure  Help needed turning from your back to your side while in a flat bed without using bedrails?: None Help needed moving from lying on your back to sitting on the side of a flat bed without using bedrails?: A Little Help needed moving to and from a bed to a chair (including a wheelchair)?: A Little Help needed standing up from a chair using your arms (e.g., wheelchair or bedside chair)?: None Help needed to walk in hospital room?: A Little Help needed climbing 3-5 steps with a railing? : A Little 6 Click Score: 20    End of Session Equipment Utilized During Treatment: Gait belt Activity Tolerance: Patient tolerated treatment well Patient left: in bed;with call bell/phone within reach;with bed alarm set;with family/visitor present Nurse Communication: Mobility status PT Visit Diagnosis: Unsteadiness on feet (R26.81);Other abnormalities of gait and mobility (R26.89);Muscle weakness (generalized) (M62.81);Ataxic gait (R26.0);Difficulty in walking, not elsewhere classified (R26.2);Other symptoms and signs involving the nervous system (R29.898)     Time: 8022-3361 PT Time Calculation (min) (ACUTE ONLY): 15 min  Charges:   $Neuromuscular Re-education: 8-22 mins                     Marisa Severin, PT, DPT Acute Rehabilitation Services Pager [phone] Office [place] 10/09/2020, 12:33 PM

## 2020-10-09 NOTE — Progress Notes (Addendum)
STROKE TEAM PROGRESS NOTE   INTERVAL HISTORY  Husband at the bedside. Pt lying in bed. Denies any complains.  On neuro examination, patient's symptoms have improved.  S/p diagnostic Cerebral angiogram today by Dr. Estanislado Pandy.  Recommended follow-up in 3 months with CTA head and neck.  PT recommends outpatient PT. No neurological testing needed from neurology stroke team.  Neurology stroke team will sign off.   Vitals:   10/09/20 0940 10/09/20 1000 10/09/20 1018 10/09/20 1139  BP: (!) 167/78 130/76 (!) 141/66 (!) 156/75  Pulse: 71 67 67 73  Resp: 17 14 18 16   Temp:   97.6 F (36.4 C) 98 F (36.7 C)  TempSrc:   Oral Oral  SpO2: 100% 99% 100% 100%  Weight:      Height:       CBC:  Recent Labs  Lab 10/06/20 1214 10/06/20 1801 10/07/20 0222 10/09/20 0058  WBC 7.6   < > 6.6 5.5  NEUTROABS 5.4  --   --  2.4  HGB 13.5   < > 12.3 11.2*  HCT 42.2   < > 36.0 32.6*  MCV 100.5*   < > 97.3 98.5  PLT 504*   < > 454* 378   < > = values in this interval not displayed.   Basic Metabolic Panel:  Recent Labs  Lab 10/07/20 0222 10/09/20 0058  NA 137 141  K 3.9 4.0  CL 108 115*  CO2 20* 19*  GLUCOSE 222* 138*  BUN 32* 25*  CREATININE 2.23* 2.05*  CALCIUM 9.2 8.6*   Lipid Panel:  Recent Labs  Lab 10/07/20 0222  CHOL 172  TRIG 102  HDL 53  CHOLHDL 3.2  VLDL 20  LDLCALC 99   HgbA1c:  Recent Labs  Lab 10/07/20 0222  HGBA1C 8.0*   Urine Drug Screen:  Recent Labs  Lab 10/06/20 2250  LABOPIA NONE DETECTED  COCAINSCRNUR NONE DETECTED  LABBENZ NONE DETECTED  AMPHETMU NONE DETECTED  THCU NONE DETECTED  LABBARB NONE DETECTED    Alcohol Level No results for input(s): ETH in the last 168 hours.  IMAGING past 24 hours  VAS US CAROTID (at [facility] and [facility] only)  10/07/2020 Summary:  Right Carotid: Velocities in the right ICA are consistent with a 1-39% stenosis. There is an enlarged, vascularized lymph node 1.52cm X 1.92 cm noted adjacent to the right common carotid artery.  Left  Carotid: Velocities in the left ICA are consistent with a 1-39% stenosis.  Vertebrals:  Bilateral vertebral arteries demonstrate antegrade flow.  Subclavians: Normal flow hemodynamics were seen in bilateral subclavian arteries.  Preliminary     PHYSICAL EXAM GENERAL: Awake, alert in NAD HEENT: - Normocephalic and atraumatic LUNGS - Symmetrical chest rise, No labored breathing noted CV - no JVD, No Peripheral Edema ABDOMEN - Soft,  nondistended  Ext: warm, well perfused, intact peripheral pulses, no Peripheral edema  NEURO Exam:   Mental Status: AA&Ox4 Oriented to self, age, place, situation, Good Attention and Concentration Language: speech is Fluent.  Pt is able to name simple objects, repetition intact,  fluency, and comprehension intact. Cranial Nerves:   CN II Pupils equal and reactive to light, no VF deficits   CN III,IV,VI EOM intact, no gaze preference or deviation, no nystagmus    CN V normal sensation in V1, V2, and V3 segments bilaterally    CN VII no asymmetry, no nasolabial fold flattening    CN VIII normal hearing to speech    CN IX & X  normal palatal elevation, no uvular deviation    CN XI 5/5 head turn and 5/5 shoulder shrug bilaterally    CN XII midline tongue protrusion    Motor: No Drift in Upper Extremities, No Drift in LE's. Strength 5/5 in Rt UE, 5/5 in Lt UE, Strength in Rt LE  5/5, Lt LE 5/5 Reflexes- UE -2+  , LE-  2+ Tone: is normal and bulk is normal. Sensation- Mildly decreased sensation to light touch on Rt UE.  Coordination: FTN intact bilaterally, No Ataxia. Heal to shin dysmetria has improved from yesterday.  Gait- deferred   ASSESSMENT/PLAN [name] is a 80 y.o. female with history of DM, GERD, HLD, HTN, hyperthyroidism presenting with balance issues and unsteady feeling for 3 days. CT brain shows focal low-density infarct in left cerebellum, MRI revealed bilateral cerebellar and right pontine infarcts. tPA was not given as she was  outside time window.  MRA shows poor visualization of left PCA beginning at mid P2 level which may reflect occlusion, proximal occlusion of the basilar artery with distal reconstitution.  HbA1c 8, Urine tox negative, lipid profile within normal limits.  ECHO Showed LVEF 65-70% with Grade 1 Diastolic function.  Vascular ultrasound showed 1-39% stenosis in Rt ICA and Left ICA.  Patient is on Aspirin 325 and Plavix 75 mg. S/p diagnostic Cerebral angiogram by Dr. Estanislado Pandy.  Recommended follow-up in 3 months with CTA head and neck.  PT recommends outpatient PT. No neurological testing needed from neurology stroke team. Recommend Asprin 325 mg and Plavix 75 mg for 3 months and then Plavix 75 mg alone. Neurology stroke team will sign off.   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  MRI  Small acute bilateral cerebellar and right pontine infarcts superimposed on multiple chronic posterior circulation infarcts.    MRA - Proximal occlusion of the basilar artery with distal Reconstitution. Poor visualization of the left PCA beginning at the mid P2 level which may reflect occlusion or severely diminished flow. Heavily diseased distal right vertebral artery. 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  Carotid Doppler unremarkable.  Incidental finding of enlarged vascularized lymph node adjacent to right CCA  2D Echo EF 65-70%  LDL 99  HgbA1c 8.0  UDS negative  VTE prophylaxis - Heparin subcu  aspirin 81 mg daily prior to admission, now on aspirin 325 mg daily and clopidogrel 75 mg daily DAPT. Recommend Asprin 325 mg and Plavix 75 mg for 3 months and then Plavix 75 mg alone.  Therapy recommendations: Outpatient PT.   Disposition: Can be D/C from Neurology stroke team.    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  Right VA high-grade stenosis versus hypoplastic  Likely the cause of patient posterior circulation infarcts  S/p diagnostic Cerebral angiogram by Dr. Estanislado Pandy. Occluded b/l VA proximally with distal recon at C2 levels. BA occlusion distal to AICAs. Right PCOM filling both PCAs. SCAs filling from right ICA.  Dr. Estanislado Pandy recommended follow-up in 3 months with CTA head and neck.   Avoid low BP   BP goal long term 130-150    AKI on CKD  Creatinine 2.26->2.23>2.05, near baseline.   On IV fluids @ 75  BMP monitoring  Encourage p.o. intake  Hypertension  Home  meds:  Norvasc 10 mg Daily  Stable.  BP on the lower side.  . IV fluids as per primary. . Long term BP goal 130 to 150 mmHg .   Hyperlipidemia  Home meds: Crestor 40 mg, resumed in hospital  LDL 99, goal < 70  Continue statin at discharge   Diabetes type II Uncontrolled  Home meds: Glipizide 5 mg BID  HgbA1c 8.0, goal < 7.0  CBG 136  SSI   close PCP follow-up for better DM control  Polycythemia vera rubra  CBC, hemoglobin, hematocrit stable.   Patient is on hydroxyurea 500 mg daily.    Other Stroke Risk Factors  Advanced Age >/= 69   Other Active Problems  Hyperthyroidism - Last TSH on 05/12/19 is normal at 1.12  Enlarged vascularized lymph node adjacent to right CCA - per Dr. Ree Kida, oncology curbsided, will follow up as outpt   [facility] day # 2   Neurology will sign off. Please call with questions. Pt will follow up with stroke clinic NP at [facility] in about 4 weeks. Thanks for the consult.  Rosalin Hawking, MD PhD Stroke Neurology 10/09/2020 4:25 PM   To contact Stroke Continuity provider, please refer to [email or web address] After hours, contact General Neurology

## 2020-10-09 NOTE — TOC Transition Note (Signed)
Transition of Care [facility]) - CM/SW Discharge Note   Patient Details  Name: Brandi Mccormick MRN: [id number] Date of Birth: July 09, 1941  Transition of Care [facility] CM/SW Contact:  Pollie Friar, RN Phone Number: 10/09/2020, 1:48 PM   Clinical Narrative:    Pt is discharging home with Outpatient therapy through the [facility]. Orders in Epic and information on the AVS.  Pt has needed transportation for outpatient and home today.   Final next level of care: OP Rehab Barriers to Discharge: No Barriers Identified   Patient Goals and CMS Choice   CMS Medicare.gov Compare Post Acute Care list provided to:: Patient Choice offered to / list presented to : [facility]  Discharge Placement                       Discharge Plan and Services                                     Social Determinants of Health (SDOH) Interventions     Readmission Risk Interventions No flowsheet data found.

## 2020-10-09 NOTE — Progress Notes (Signed)
Occupational Therapy Treatment Patient Details Name: Brandi Mccormick MRN: [id number] DOB: 1941-04-05 Today's Date: 10/09/2020    History of present illness Pt is a 80 y.o. female who presents with acute impaired balance and dizziness that began 2 days PTA along with a headache. CT concerning for acute infarct in L medial cerebellum. 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 s/p cerebral arteriogram 10/09/20. PMH: DM, HTN, polycythemia vera, and hyperthyroidism.   OT comments  OT treatment session with focus on self-care re-education, ADL transfers, and RUE NMR. Patient reporting need for toileting upon transition for supine to EOB. Min guard provided for functional mobility to commode in bathroom without AD. Patient completed 3/3 parts of toileting task and hand hygiene at sink with Min guard for safety. Both patient and spouse present at bedside note that patient is functioning near baseline. Patient education on [facility] HEP noting completing some exercises on her own at times. Patient would benefit from continued acute OT services in prep for safe d/c. Given patient progress toward goals, recommendation updated to neuro OPOT. Husband reports ability to provide transportation.     Follow Up Recommendations  Outpatient OT (Neuro)    Equipment Recommendations  3 in 1 bedside commode;Other (comment)    Recommendations for Other Services      Precautions / Restrictions Precautions Precautions: Fall Restrictions Weight Bearing Restrictions: No       Mobility Bed Mobility Overal bed mobility: Needs Assistance Bed Mobility: Supine to Sit     Supine to sit: Modified independent (Device/Increase time);HOB elevated Sit to supine: Modified independent (Device/Increase time);HOB elevated   General bed mobility comments: No external assist needed. Completes  task in reasonable amount of time.  Transfers Overall transfer level: Needs assistance Equipment used: None Transfers: Sit to/from Stand Sit to Stand: Min guard         General transfer comment: Min guard for safety. Able to stand without pushing through UEs. Stood from EOB x2 and from standard height commode x1.    Balance Overall balance assessment: Needs assistance Sitting-balance support: Feet supported;No upper extremity supported Sitting balance-Leahy Scale: Good     Standing balance support: During functional activity Standing balance-Leahy Scale: Fair Standing balance comment: Does not require UE support or external support for dynamic tasks today             High level balance activites: Head turns;Sudden stops;Turns;Direction changes High Level Balance Comments: Tolerated above with only mild deviations in gait but no overt LOB.           ADL either performed or assessed with clinical judgement   ADL                           Toilet Transfer: Min guard   Toileting- Clothing Manipulation and Hygiene: Min guard;Sitting/lateral lean       Functional mobility during ADLs: Min guard General ADL Comments: Min guard for short-distance functional mobility in room without AD. Patient with restrictions at R wrist s/p procedure this A.M.     Vision       Perception     Praxis      Cognition Arousal/Alertness: Awake/alert Behavior During Therapy: WFL for tasks assessed/performed Overall Cognitive Status: Within Functional Limits for tasks assessed Area of Impairment: Safety/judgement  Safety/Judgement: Decreased awareness of deficits     General Comments: Patient and spouse report patient is near baseline level of function.        Exercises     Shoulder Instructions       General Comments Spouse Laverna Peace present at bedside.    Pertinent Vitals/ Pain       Pain Assessment: No/denies pain  Home  Living                                          Prior Functioning/Environment              Frequency  Min 2X/week        Progress Toward Goals  OT Goals(current goals can now be found in the care plan section)  Progress towards OT goals: Progressing toward goals  Acute Rehab OT Goals Patient Stated Goal: be able to garden and walk independently OT Goal Formulation: With patient/family Time For Goal Achievement: 10/21/20 Potential to Achieve Goals: Good ADL Goals Pt Will Perform Grooming: with modified independence;standing Pt Will Perform Upper Body Dressing: with modified independence;sitting Pt Will Perform Lower Body Dressing: with modified independence;sit to/from stand Pt Will Transfer to Toilet: with modified independence;ambulating Pt Will Perform Toileting - Clothing Manipulation and hygiene: with modified independence;sit to/from stand Pt Will Perform Tub/Shower Transfer: Tub transfer;with supervision;with caregiver independent in assisting;ambulating;shower seat  Plan Discharge plan needs to be updated;Frequency remains appropriate    Co-evaluation                 AM-PAC OT "6 Clicks" Daily Activity     Outcome Measure   Help from another person eating meals?: None Help from another person taking care of personal grooming?: A Little Help from another person toileting, which includes using toliet, bedpan, or urinal?: A Little Help from another person bathing (including washing, rinsing, drying)?: A Little Help from another person to put on and taking off regular upper body clothing?: A Little Help from another person to put on and taking off regular lower body clothing?: A Little 6 Click Score: 19    End of Session Equipment Utilized During Treatment: Gait belt  OT Visit Diagnosis: Unsteadiness on feet (R26.81);Other abnormalities of gait and mobility (R26.89);Muscle weakness (generalized) (M62.81)   Activity Tolerance Patient  tolerated treatment well   Patient Left in chair;with call bell/phone within reach;with family/visitor present;with chair alarm set   Nurse Communication          Time: [phone] OT Time Calculation (min): 21 min  Charges: OT General Charges $OT Visit: 1 Visit OT Treatments $Self Care/Home Management : 8-22 mins  July Linam H. OTR/L Supplemental OT, Department of rehab services [phone]   Addasyn Mcbreen R H. 10/09/2020, 2:23 PM

## 2020-10-09 NOTE — Sedation Documentation (Signed)
Patient transported to [facility] RN at the bedside to assess TR Band. +2 right radial pulse intact. No drainage noted from site.

## 2020-10-11 ENCOUNTER — Telehealth: Payer: Self-pay

## 2020-10-11 ENCOUNTER — Encounter (HOSPITAL_COMMUNITY): Payer: Self-pay

## 2020-10-11 NOTE — Telephone Encounter (Signed)
Transition Care Management Follow-up Telephone Call  Date of discharge and from where:10/09/20-[facility]  How have you been since you were released from the hospital? Doing ok. Having some stomach issues.(Nausea)  Any questions or concerns? No  Items Reviewed:  Did the pt receive and understand the discharge instructions provided? Yes   Medications obtained and verified? Yes   Other? Yes   Any new allergies since your discharge? No   Dietary orders reviewed? Yes  Do you have support at home? Yes   Home Care and Equipment/Supplies: Were home health services ordered? no If so, what is the name of the agency? n/a  Has the agency set up a time to come to the patient's home? not applicable Were any new equipment or medical supplies ordered?  No What is the name of the medical supply agency? n/a Were you able to get the supplies/equipment? not applicable Do you have any questions related to the use of the equipment or supplies? n/a  Functional Questionnaire: (I = Independent and D = Dependent) ADLs: I  Bathing/Dressing- I  Meal Prep- I  Eating- I  Maintaining continence- I  Transferring/Ambulation- I  Managing Meds- I  Follow up appointments reviewed:   PCP Hospital f/u appt confirmed? Yes  Scheduled to see Dr. Etter Sjogren on 10/12/20 @ 1:40.  [facility] f/u appt confirmed? Yes  Scheduled to see Royston Sinner on 10/24/20 @ 4:15  Are transportation arrangements needed? No   If their condition worsens, is the pt aware to call PCP or go to the Emergency Dept.? Yes  Was the patient provided with contact information for the PCP's office or ED? Yes  Was to pt encouraged to call back with questions or concerns? Yes

## 2020-10-12 ENCOUNTER — Other Ambulatory Visit: Payer: Self-pay

## 2020-10-12 ENCOUNTER — Encounter: Payer: Medicare HMO | Admitting: Family Medicine

## 2020-10-18 ENCOUNTER — Ambulatory Visit: Payer: Medicare HMO | Admitting: Physical Therapy

## 2020-10-18 ENCOUNTER — Ambulatory Visit (INDEPENDENT_AMBULATORY_CARE_PROVIDER_SITE_OTHER): Payer: Medicare HMO | Admitting: Family

## 2020-10-18 ENCOUNTER — Ambulatory Visit: Payer: Medicare HMO | Admitting: Speech Pathology

## 2020-10-18 ENCOUNTER — Encounter: Payer: Self-pay | Admitting: Family

## 2020-10-18 ENCOUNTER — Other Ambulatory Visit: Payer: Self-pay

## 2020-10-18 ENCOUNTER — Ambulatory Visit: Payer: Medicare HMO | Admitting: Occupational Therapy

## 2020-10-18 VITALS — BP 139/63 | HR 73 | Temp 98.4°F | Resp 16 | Ht 64.0 in | Wt 126.0 lb

## 2020-10-18 DIAGNOSIS — Z8673 Personal history of transient ischemic attack (TIA), and cerebral infarction without residual deficits: Secondary | ICD-10-CM

## 2020-10-18 DIAGNOSIS — I639 Cerebral infarction, unspecified: Secondary | ICD-10-CM

## 2020-10-18 DIAGNOSIS — R1013 Epigastric pain: Secondary | ICD-10-CM | POA: Diagnosis not present

## 2020-10-18 MED ORDER — PANTOPRAZOLE SODIUM 40 MG PO TBEC: 40.0000 mg | DELAYED_RELEASE_TABLET | Freq: Every day | ORAL | 0 refills | Status: DC

## 2020-10-18 NOTE — Progress Notes (Signed)
Subjective:    Patient ID: Brandi Mccormick, female    DOB: 14-Apr-1941, 80 y.o.   MRN: [id number]  HPI  Patient is a 80 yr old female who presents today with chief complaint of abdominal discomfort and nausea. Discomfort is located in the epigastric area. Reports that symptoms started a few weeks ago. Denies vomiting. Reports some constipation lately.  Reports that she had diarrhea x 2 last week. Last BM this AM.  Denies fever, gerd, black or bloody stools.  No previous hx of similar discomfort.  She reports that she takes aspirin 2-3 times a week for headache. She is also taking aleve sometimes.    Review of Systems    see HPI  Past Medical History:  Diagnosis Date  . Diabetes ([place])   . GERD (gastroesophageal reflux disease)   . Hyperlipidemia   . Hypertension   . Hyperthyroidism   . Pneumonia   . Shingles      Social History   Socioeconomic History  . Marital status: Married    Spouse name: Not on file  . Number of children: Not on file  . Years of education: Not on file  . Highest education level: Not on file  Occupational History  . Occupation: [employer]--    Comment: retired  Tobacco Use  . Smoking status: Never Smoker  . Smokeless tobacco: Never Used  Vaping Use  . Vaping Use: Never used  Substance and Sexual Activity  . Alcohol use: Yes    Alcohol/week: 0.0 standard drinks    Comment: occasional wine cooler  . Drug use: No  . Sexual activity: Never    Partners: Male  Other Topics Concern  . Not on file  Social History Narrative   Exercise-- yard work,  Walking in [place] Strain: Low Risk   . Difficulty of Paying Living Expenses: Not hard at all  Food Insecurity: No Food Insecurity  . Worried About Charity fundraiser in the Last Year: Never true  . Ran Out of Food in the Last Year: Never true  Transportation Needs: No Transportation Needs  . Lack of Transportation (Medical): No  . Lack of  Transportation (Non-Medical): No  Physical Activity: Not on file  Stress: Not on file  Social Connections: Not on file  Intimate Partner Violence: Not on file    Past Surgical History:  Procedure Laterality Date  . CATARACT EXTRACTION Left 2020  . COLONOSCOPY    . DENTAL SURGERY    . IR ANGIO INTRA EXTRACRAN SEL COM CAROTID INNOMINATE BILAT MOD SED  10/09/2020  . IR ANGIO VERTEBRAL SEL VERTEBRAL BILAT MOD SED  10/09/2020    Family History  Problem Relation Age of Onset  . Diabetes Sister   . Diabetes Brother   . Diabetes Sister   . Diabetes Mother        and on mother's side of family  . Colon cancer Mother   . Stroke Mother   . Diabetes Father        and on father's side of family  . Heart disease Neg Hx   . Rectal cancer Neg Hx   . Stomach cancer Neg Hx     Allergies  Allergen Reactions  . Penicillins Nausea And Vomiting    Has patient had a PCN reaction causing immediate rash, facial/tongue/throat swelling, SOB or lightheadedness with hypotension: no Has patient had a PCN reaction causing severe rash involving mucus membranes or  skin necrosis: no Has patient had a PCN reaction that required hospitalization: no Has patient had a PCN reaction occurring within the last 10 years: no If all of the above answers are "NO", then may proceed with Cephalosporin use.     Current Outpatient Medications on File Prior to Visit  Medication Sig Dispense Refill  . amLODipine (NORVASC) 10 MG tablet Take 1 tablet by mouth once daily 90 tablet 0  . aspirin EC 325 MG EC tablet Take 1 tablet (325 mg total) by mouth daily. 30 tablet 2  . clopidogrel (PLAVIX) 75 MG tablet Take 1 tablet (75 mg total) by mouth daily. 30 tablet 3  . famotidine (PEPCID) 20 MG tablet Take 20 mg by mouth daily.    . furosemide (LASIX) 20 MG tablet Take 20 mg by mouth 2 (two) times daily as needed for fluid.    Marland Kitchen glipiZIDE (GLUCOTROL XL) 5 MG 24 hr tablet Take 2 tablets (10 mg total) by mouth daily. 90 tablet 2  .  hydroxyurea (HYDREA) 500 MG capsule Take 1 capsule by mouth once daily (Patient taking differently: Take 500 mg by mouth daily.) 90 capsule 0  . Lancets (ONETOUCH DELICA PLUS IRCVEL38B) MISC USE   TO CHECK GLUCOSE TWICE DAILY 100 each 11  . lisinopril (ZESTRIL) 40 MG tablet Take 1 tablet (40 mg total) by mouth daily. 90 tablet 1  . metoprolol succinate (TOPROL-XL) 25 MG 24 hr tablet Take 1 tablet by mouth once daily 90 tablet 0  . ONETOUCH VERIO test strip USE 1 STRIP TO CHECK GLUCOSE ONCE DAILY 50 each 11  . rosuvastatin (CRESTOR) 40 MG tablet Take 1 tablet by mouth once daily 90 tablet 0   No current facility-administered medications on file prior to visit.    BP 139/63 (BP Location: Right Arm, Patient Position: Sitting, Cuff Size: Small)   Pulse 73   Temp 98.4 F (36.9 C) (Temporal)   Resp 16   Ht 5\' 4"  (1.626 m)   Wt 126 lb (57.2 kg)   SpO2 99%   BMI 21.63 kg/m    Objective:   Physical Exam Constitutional:      Appearance: She is well-developed and well-nourished.  Cardiovascular:     Rate and Rhythm: Normal rate and regular rhythm.     Heart sounds: Normal heart sounds. No murmur heard.   Pulmonary:     Effort: Pulmonary effort is normal. No respiratory distress.     Breath sounds: Normal breath sounds. No wheezing.  Abdominal:     Palpations: Abdomen is soft.     Tenderness: There is no abdominal tenderness. There is no right CVA tenderness or left CVA tenderness.  Psychiatric:        Mood and Affect: Mood and affect normal.        Behavior: Behavior normal.        Thought Content: Thought content normal.        Judgment: Judgment normal.           Assessment & Plan:  Epigastric pain- suspect gastritis due to NSAID overuse.  Will initiate protonix 40mg  once daily, advised pt to d/c aleve. Recommended 2 week follow up and that she call sooner if symptoms worsen or fail to improve.   CVA- pt was admitted with acute CVA on 2/7.  She was discharged home on  aspirin 325mg . She did not understand this and had continued the aspirin 81mg  once daily.  I advised pt to increase her daily aspirin to  325mg  once daily but not to take any additional aspirin daily.    This visit occurred during the SARS-CoV-2 public health emergency.  Safety protocols were in place, including screening questions prior to the visit, additional usage of staff PPE, and extensive cleaning of exam room while observing appropriate contact time as indicated for disinfecting solutions.

## 2020-10-18 NOTE — Patient Instructions (Addendum)
You should take aspirin 325mg  once daily for stroke prevention.  Stop aspirin 81mg .  Do not take any additional aspirin or aleve. You may use tylenol as needed for pain.  Begin protonix 40mg  once daily for your stomach.

## 2020-10-19 NOTE — Progress Notes (Signed)
This encounter was created in error - please disregard.

## 2020-10-21 ENCOUNTER — Other Ambulatory Visit: Payer: Self-pay | Admitting: Internal Medicine

## 2020-10-24 ENCOUNTER — Ambulatory Visit: Payer: Medicare HMO | Admitting: Speech Pathology

## 2020-10-26 ENCOUNTER — Other Ambulatory Visit: Payer: Self-pay

## 2020-10-26 ENCOUNTER — Telehealth: Payer: Self-pay | Admitting: Oncology

## 2020-10-26 ENCOUNTER — Inpatient Hospital Stay: Payer: Medicare HMO

## 2020-10-26 ENCOUNTER — Inpatient Hospital Stay: Payer: Medicare HMO | Attending: Nurse Practitioner | Admitting: Oncology

## 2020-10-26 VITALS — BP 145/63 | HR 73 | Temp 97.8°F | Resp 18 | Ht 64.0 in | Wt 128.0 lb

## 2020-10-26 DIAGNOSIS — Z8673 Personal history of transient ischemic attack (TIA), and cerebral infarction without residual deficits: Secondary | ICD-10-CM | POA: Insufficient documentation

## 2020-10-26 DIAGNOSIS — D471 Chronic myeloproliferative disease: Secondary | ICD-10-CM | POA: Diagnosis not present

## 2020-10-26 DIAGNOSIS — I10 Essential (primary) hypertension: Secondary | ICD-10-CM | POA: Insufficient documentation

## 2020-10-26 DIAGNOSIS — Z79899 Other long term (current) drug therapy: Secondary | ICD-10-CM | POA: Diagnosis not present

## 2020-10-26 DIAGNOSIS — E119 Type 2 diabetes mellitus without complications: Secondary | ICD-10-CM | POA: Diagnosis not present

## 2020-10-26 DIAGNOSIS — D45 Polycythemia vera: Secondary | ICD-10-CM | POA: Diagnosis not present

## 2020-10-26 LAB — CBC WITH DIFFERENTIAL (CANCER CENTER ONLY)
Abs Immature Granulocytes: 0.02 10*3/uL (ref 0.00–0.07)
Basophils Absolute: 0.1 10*3/uL (ref 0.0–0.1)
Basophils Relative: 1 %
Eosinophils Absolute: 0.1 10*3/uL (ref 0.0–0.5)
Eosinophils Relative: 1 %
HCT: 38.3 % (ref 36.0–46.0)
Hemoglobin: 12.6 g/dL (ref 12.0–15.0)
Immature Granulocytes: 0 %
Lymphocytes Relative: 16 %
Lymphs Abs: 1.3 10*3/uL (ref 0.7–4.0)
MCH: 33.1 pg (ref 26.0–34.0)
MCHC: 32.9 g/dL (ref 30.0–36.0)
MCV: 100.5 fL — ABNORMAL HIGH (ref 80.0–100.0)
Monocytes Absolute: 0.5 10*3/uL (ref 0.1–1.0)
Monocytes Relative: 7 %
Neutro Abs: 5.9 10*3/uL (ref 1.7–7.7)
Neutrophils Relative %: 75 %
Platelet Count: 429 10*3/uL — ABNORMAL HIGH (ref 150–400)
RBC: 3.81 MIL/uL — ABNORMAL LOW (ref 3.87–5.11)
RDW: 15.7 % — ABNORMAL HIGH (ref 11.5–15.5)
WBC Count: 7.9 10*3/uL (ref 4.0–10.5)
nRBC: 0 % (ref 0.0–0.2)

## 2020-10-26 NOTE — Telephone Encounter (Signed)
Scheduled follow-up appointment per 2/24 los. Patient is aware. °

## 2020-10-26 NOTE — Progress Notes (Signed)
  [facility] OFFICE PROGRESS NOTE   Diagnosis: Polycythemia vera  INTERVAL HISTORY:   Brandi Mccormick returns for a scheduled visit.  She continues hydroxyurea.  She was admitted on 10/06/2020 with leg weakness and "dizziness ".  He was diagnosed with acute bilateral cerebellar and right pontine infarcts.  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  She was placed on aspirin and Plavix.  Brandi Mccormick reports her stroke symptoms have resolved.  No complaint today.  Objective:  Vital signs in last 24 hours:  Blood pressure (!) 145/63, pulse 73, temperature 97.8 F (36.6 C), temperature source Tympanic, resp. rate 18, height 5\' 4"  (1.626 m), weight 128 lb (58.1 kg), SpO2 100 %.    HEENT: No thrush or ulcers Resp: Lungs clear bilaterally Cardio: Regular rate and rhythm GI: No hepatosplenomegaly Vascular: No leg edema   Lab Results:  Lab Results  Component Value Date   WBC 7.9 10/26/2020   HGB 12.6 10/26/2020   HCT 38.3 10/26/2020   MCV 100.5 (H) 10/26/2020   PLT 429 (H) 10/26/2020   NEUTROABS 5.9 10/26/2020    CMP  Lab Results  Component Value Date   NA 141 10/09/2020   K 4.0 10/09/2020   CL 115 (H) 10/09/2020   CO2 19 (L) 10/09/2020   GLUCOSE 138 (H) 10/09/2020   BUN 25 (H) 10/09/2020   CREATININE 2.05 (H) 10/09/2020   CALCIUM 8.6 (L) 10/09/2020   PROT 5.3 (L) 10/09/2020   ALBUMIN 2.7 (L) 10/09/2020   AST 14 (L) 10/09/2020   ALT 13 10/09/2020   ALKPHOS 43 10/09/2020   BILITOT 0.7 10/09/2020   GFRNONAA 24 (L) 10/09/2020   GFRAA 24 (L) 06/10/2019    No results found for: CEA1  Lab Results  Component Value Date   INR 1.0 10/09/2020    Imaging:  No results found.  Medications: I have reviewed the patient's current medications.   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 Type 2 diabetes 3. Hypertension 4. Focal/segmental glomerular sclerosis 5. 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  She continues hydroxyurea.  The hemoglobin and platelet count are in goal range today.  She had a recent CVA, likely related to atherosclerotic vascular disease and hypertension, though the myeloproliferative disorder may have contributed.  She will continue hydroxyurea, aspirin, and Plavix.  She will return for a CBC in 3 months.  Betsy Coder, MD  10/26/2020  9:18 AM

## 2020-11-02 ENCOUNTER — Ambulatory Visit: Payer: Medicare HMO | Admitting: Internal Medicine

## 2020-11-02 ENCOUNTER — Encounter: Payer: Self-pay | Admitting: Internal Medicine

## 2020-11-02 ENCOUNTER — Other Ambulatory Visit: Payer: Self-pay

## 2020-11-02 VITALS — BP 120/80 | HR 77 | Ht 64.0 in | Wt 127.0 lb

## 2020-11-02 DIAGNOSIS — E785 Hyperlipidemia, unspecified: Secondary | ICD-10-CM | POA: Diagnosis not present

## 2020-11-02 DIAGNOSIS — E1165 Type 2 diabetes mellitus with hyperglycemia: Secondary | ICD-10-CM

## 2020-11-02 DIAGNOSIS — E1169 Type 2 diabetes mellitus with other specified complication: Secondary | ICD-10-CM

## 2020-11-02 DIAGNOSIS — Z8639 Personal history of other endocrine, nutritional and metabolic disease: Secondary | ICD-10-CM

## 2020-11-02 LAB — TSH: TSH: 0.37 u[IU]/mL (ref 0.35–4.50)

## 2020-11-02 NOTE — Patient Instructions (Addendum)
Please continue: - Glipizide XL 10 mg before breakfast   If you start PT and sugars decrease >> may take only 5 mg Glipizide before breakfast that day.  PLEASE STAY AWAY SWEET TEA AND REGULAR SODAS!  Please stop at the lab.  Please return in 4 months with your sugar log.

## 2020-11-02 NOTE — Progress Notes (Signed)
Patient ID: Brandi Mccormick, female   DOB: 31-Aug-1941, 80 y.o.   MRN: [id number]  This visit occurred during the SARS-CoV-2 public health emergency.  Safety protocols were in place, including screening questions prior to the visit, additional usage of staff PPE, and extensive cleaning of exam room while observing appropriate contact time as indicated for disinfecting solutions.   HPI: Brandi Mccormick is a 80 y.o.-year-old female, presenting for f/u for DM2, dx 2013, non-insulin-dependent, uncontrolled, with complications (microalbuminuria) and h/o hyperthyroidism. Last visit 4 mo ago.  At last visit, she had CVA on 10/06/2020.  She recovered well but is preparing to start physical therapy.  DM2:- Reviewed HbA1c levels: Lab Results  Component Value Date   HGBA1C 8.0 (H) 10/07/2020   HGBA1C 7.3 (A) 06/29/2020   HGBA1C 7.2 (A) 02/24/2020   HGBA1C 7.2 (H) 11/04/2019   HGBA1C 6.6 (H) 05/06/2019   HGBA1C 6.1 12/21/2018   HGBA1C 8.3 (H) 09/11/2018   HGBA1C 6.9 (A) 06/04/2018   HGBA1C 7.6 (A) 02/02/2018   HGBA1C 7.4 10/03/2017   HGBA1C 7.0 (H) 04/18/2017   HGBA1C 6.9 02/14/2017   HGBA1C 7.3 11/15/2016   HGBA1C 6.6 06/28/2016   HGBA1C 7.1 03/28/2016   Pt is on a regimen of: - Glipizide XL 5 mg in a.m.  >> 10 mg in a.m. We tried Tradjenta 5 mg daily in am - too expensive. She was previously on Janumet XR and Metformin ER, but this was stopped 05/2019 due to declining kidney function.  Pt checks her sugars once a day per review of her detailed log: - am:  136-168 >> 85-152 >> 72-129, 135 >> 92-130, 141 - 2h after b'fast: 132 >> n/c >> 127-155 >> 124 >> 125-152 - before lunch: 101, 205 >> 123-148 >> 100-152 >> 129 - 2h after lunch:  133-153 >> 128-152 >>  131-135 - before dinner: 88, 117-140 >> 89, 140-145 >> 97-133 - 2h after dinner: 117 >> 141, 152 >> 114-152 >> 138, 146 - bedtime:  69, 149 >> 137-169 >> 153 >> 145 >> n/c - nighttime: n/c >> 161 >> n/c Lowest sugar: 64 (delayed lunch)  >> 101 >> 72 >> 92; it is unclear at which level she has hypoglycemia awareness. Highest sugar: 239 >> 155 >> 152 >> 152  Meter: OneTouch Verio.  Pt's meals are: - Breakfast: egg, coffee, juice; sometime - Lunch: No Lunch - Dinner: meat + vegetables >> late dinner during the weekend as her son is cooking and brings her food - Snacks: 1-2 At last visit, she restarted to drink regular sodas and sweet tea and I strongly advised her to stop. She cut back on the tea and stopped sodas.  -+ CKD (FSGS-sees nephrology), last BUN/creatinine:  Lab Results  Component Value Date   BUN 25 (H) 10/09/2020   CREATININE 2.05 (H) 10/09/2020   Lab Results  Component Value Date   GFRAA 24 (L) 06/10/2019   GFRAA 27 (L) 04/28/2019   GFRAA 43 (L) 07/27/2018   GFRAA >60 06/26/2018   GFRAA 38 (L) 06/02/2018   GFRAA 82 06/28/2016   GFRAA >90 06/25/2014   GFRAA >90 06/24/2014   GFRAA >90 10/23/2013  12/21/2018: GFR 28.9   She has MAU: Lab Results  Component Value Date   MICRALBCREAT 43.8 (H) 11/04/2019   MICRALBCREAT 77.6 (H) 05/06/2019   MICRALBCREAT 422.7 (H) 04/02/2018   MICRALBCREAT 225.8 (H) 11/15/2016   MICRALBCREAT 48.8 (H) 06/28/2016   MICRALBCREAT 9.9 02/21/2014   MICRALBCREAT 8.8 11/16/2013  On lisinopril 40.  -+ CKD; last set of lipids: Lab Results  Component Value Date   CHOL 172 10/07/2020   HDL 53 10/07/2020   LDLCALC 99 10/07/2020   TRIG 102 10/07/2020   CHOLHDL 3.2 10/07/2020  Prev. on Simvastatin >> off in 06/2016 b/c fatigue.  She started pravastatin 80 mg daily in 12/2016 >> currently on Zetia 10 and Crestor 40.   - no numbness and tingling in her feet.  + Onychomycosis  - Last eye exam in 2021: No DR reportedly; Had OS cataract sx.   H/o hyperthyroidism - previously treated with medication, more than 20 years ago.  No other history available.  Pt denies: - feeling nodules in neck - hoarseness - dysphagia - choking - SOB with lying down  Her TFTs were  normal: Lab Results  Component Value Date   TSH 1.12 05/12/2019   TSH 0.635 06/26/2018   TSH 1.11 04/02/2018   TSH 1.13 07/03/2017   TSH 1.12 06/28/2016   TSH 1.29 06/23/2015   TSH 1.44 10/18/2014   TSH 0.93 08/15/2014   TSH 0.44 02/17/2014   TSH 0.28 (L) 12/20/2013   FREET4 0.85 05/12/2019   FREET4 0.93 07/03/2017   FREET4 0.96 06/28/2016   FREET4 0.91 08/15/2014   FREET4 1.00 02/17/2014   FREET4 0.97 12/20/2013   T3FREE 2.7 05/12/2019   T3FREE 3.2 07/03/2017   T3FREE 3.1 06/28/2016   T3FREE 3.2 08/15/2014   T3FREE 2.6 02/17/2014   T3FREE 2.9 12/20/2013   ROS: Constitutional: no weight gain/no weight loss, no fatigue, no subjective hyperthermia, no subjective hypothermia Eyes: no blurry vision, no xerophthalmia ENT: no sore throat, + see HPI Cardiovascular: no CP/no SOB/no palpitations/no leg swelling Respiratory: no cough/no SOB/no wheezing Gastrointestinal: no N/no V/no D/no C/no acid reflux Musculoskeletal: no muscle aches/no joint aches Skin: no rashes, no hair loss Neurological: no tremors/no numbness/no tingling/no dizziness  I reviewed pt's medications, allergies, PMH, social hx, family hx, and changes were documented in the history of present illness. Otherwise, unchanged from my initial visit note.  Past Medical History:  Diagnosis Date  . CVA (cerebral vascular accident) ([place]) 10/2020  . Diabetes ([place])   . GERD (gastroesophageal reflux disease)   . Hyperlipidemia   . Hypertension   . Hyperthyroidism   . Pneumonia   . Shingles    Past Surgical History:  Procedure Laterality Date  . CATARACT EXTRACTION Left 2020  . COLONOSCOPY    . DENTAL SURGERY    . IR ANGIO INTRA EXTRACRAN SEL COM CAROTID INNOMINATE BILAT MOD SED  10/09/2020  . IR ANGIO VERTEBRAL SEL VERTEBRAL BILAT MOD SED  10/09/2020   Social History   Socioeconomic History  . Marital status: Married    Spouse name: Not on file  . Number of children: Not on file  . Years of education: Not on  file  . Highest education level: Not on file  Occupational History  . Occupation: [employer]--    Comment: retired  Tobacco Use  . Smoking status: Never Smoker  . Smokeless tobacco: Never Used  Vaping Use  . Vaping Use: Never used  Substance and Sexual Activity  . Alcohol use: Yes    Alcohol/week: 0.0 standard drinks    Comment: occasional wine cooler  . Drug use: No  . Sexual activity: Never    Partners: Male  Other Topics Concern  . Not on file  Social History Narrative   Exercise-- yard work,  Walking in [facility]   Financial Resource Strain: Low Risk   . Difficulty of Paying Living Expenses: Not hard at all  Food Insecurity: No Food Insecurity  . Worried About Charity fundraiser in the Last Year: Never true  . Ran Out of Food in the Last Year: Never true  Transportation Needs: No Transportation Needs  . Lack of Transportation (Medical): No  . Lack of Transportation (Non-Medical): No  Physical Activity: Not on file  Stress: Not on file  Social Connections: Not on file  Intimate Partner Violence: Not on file   Current Outpatient Medications on File Prior to Visit  Medication Sig Dispense Refill  . amLODipine (NORVASC) 10 MG tablet Take 1 tablet by mouth once daily 90 tablet 0  . aspirin EC 325 MG EC tablet Take 1 tablet (325 mg total) by mouth daily. 30 tablet 2  . clopidogrel (PLAVIX) 75 MG tablet Take 1 tablet (75 mg total) by mouth daily. 30 tablet 3  . famotidine (PEPCID) 20 MG tablet Take 20 mg by mouth daily.    . furosemide (LASIX) 20 MG tablet Take 20 mg by mouth 2 (two) times daily as needed for fluid. (Patient not taking: Reported on 10/26/2020)    . glipiZIDE (GLUCOTROL XL) 5 MG 24 hr tablet Take 2 tablets (10 mg total) by mouth daily. 90 tablet 2  . hydroxyurea (HYDREA) 500 MG capsule Take 1 capsule by mouth once daily (Patient taking differently: Take 500 mg by mouth daily.) 90 capsule 0  . Lancets (ONETOUCH DELICA PLUS [id number])  MISC USE 1  TO CHECK GLUCOSE TWICE DAILY 100 each 3  . lisinopril (ZESTRIL) 40 MG tablet Take 1 tablet (40 mg total) by mouth daily. 90 tablet 1  . metoprolol succinate (TOPROL-XL) 25 MG 24 hr tablet Take 1 tablet by mouth once daily 90 tablet 0  . ONETOUCH VERIO test strip USE 1 STRIP TO CHECK GLUCOSE ONCE DAILY 50 each 11  . pantoprazole (PROTONIX) 40 MG tablet Take 1 tablet (40 mg total) by mouth daily. 30 tablet 0  . rosuvastatin (CRESTOR) 40 MG tablet Take 1 tablet by mouth once daily 90 tablet 0   No current facility-administered medications on file prior to visit.   Allergies  Allergen Reactions  . Penicillins Nausea And Vomiting    Has patient had a PCN reaction causing immediate rash, facial/tongue/throat swelling, SOB or lightheadedness with hypotension: no Has patient had a PCN reaction causing severe rash involving mucus membranes or skin necrosis: no Has patient had a PCN reaction that required hospitalization: no Has patient had a PCN reaction occurring within the last 10 years: no If all of the above answers are "NO", then may proceed with Cephalosporin use.    Family History  Problem Relation Age of Onset  . Diabetes Sister   . Diabetes Brother   . Diabetes Sister   . Diabetes Mother        and on mother's side of family  . Colon cancer Mother   . Stroke Mother   . Diabetes Father        and on father's side of family  . Heart disease Neg Hx   . Rectal cancer Neg Hx   . Stomach cancer Neg Hx     PE: BP 120/80 (BP Location: Right Arm, Patient Position: Sitting, Cuff Size: Normal)   Pulse 77   Ht 5\' 4"  (1.626 m)   Wt 127 lb (57.6 kg)   SpO2 98%   BMI 21.80  kg/m  Body mass index is 21.8 kg/m. Wt Readings from Last 3 Encounters:  11/02/20 127 lb (57.6 kg)  10/26/20 128 lb (58.1 kg)  10/18/20 126 lb (57.2 kg)   Constitutional:  Normal weight, in NAD Eyes: PERRLA, EOMI, no exophthalmos ENT: moist mucous membranes, no thyromegaly, no cervical  lymphadenopathy Cardiovascular: RRR, No MRG Respiratory: CTA B Gastrointestinal: abdomen soft, NT, ND, BS+ Musculoskeletal: no deformities, strength intact in all 4 Skin: moist, warm, no rashes Neurological: no tremor with outstretched hands, DTR normal in all 4  ASSESSMENT: 1. DM2, non-insulin-dependent, uncontrolled, with complications - MAU  2. H/o Hyperthyroidism - 20 years ago - was on meds, cannot remember name  3. HL  PLAN:  1. Patient with history of uncontrolled diabetes with improved control in the last few years, on sulfonylurea only.  We had to stop Metformin ER in the past due to declining kidney function.  We are limited in medication choices due to her decreased kidney function.  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 Johnney Ou.  She is not on prednisone. -At last visit, she was drinking regular sodas and sweet tea again and I strongly advised her to stop.   HbA1c was slightly higher, at 7.3%, however, she had an HbA1c last month which was higher, at 8.0%.  At that time, she presented with a CVA. -At today's visit, reviewing her sugar log, her CBGs are mostly at goal, with very few mild hyperglycemic exceptions.  Overall, the blood sugars at home did not correlate with a high HbA1c that she recently had.  Therefore, at today's visit, we will check a fructosamine level.  Since she is preparing to start physical therapy, we discussed about potentially dropping her sugars after exercise, in which case, she will need to use only 5 mg of glipizide before breakfast in the days with PT. -We did discuss about the possible escalation of regimen, but I do not feel it is absolutely necessary for now.  However, if fructosamine level returns tonight, we may need to add a GLP-1 receptor agonist if covered over medication, possibly pioglitazone, which has been shown to reduce the risk of stroke. - I suggested to:  Patient Instructions  Please continue: - Glipizide XL 10 mg before breakfast   If you start  PT and sugars decrease >> may take only 5 mg Glipizide before breakfast that day.  PLEASE STAY AWAY SWEET TEA AND REGULAR SODAS!  Please stop at the lab.  Please return in 4 months with your sugar log.   - advised to check sugars at different times of the day - 1x a day, rotating check times - advised for yearly eye exams >> she is UTD - return to clinic in 4 months  2. H/o hyperthyroidism -No thyrotoxic complaints -Latest TSH was normal: Lab Results  Component Value Date   TSH 1.12 05/12/2019  -We will recheck today  3. HL -Reviewed latest lipid panel from last month: Fractions at goal Lab Results  Component Value Date   CHOL 172 10/07/2020   HDL 53 10/07/2020   LDLCALC 99 10/07/2020   TRIG 102 10/07/2020   CHOLHDL 3.2 10/07/2020  -Continues high-dose Crestor, 40 mg daily and Zetia 10 mg, without side effects  Component     Latest Ref Rng & Units 11/02/2020  TSH     0.35 - 4.50 uIU/mL 0.37  Fructosamine     205 - 285 umol/L 496 (H)  TSH normal. HbA1c calc. From fructosamine: 10%, even higher than  the directly measured HbA1c... ??? I will ask her to come to the clinic with her meter so we can verify it.  Philemon Kingdom, MD PhD [facility]

## 2020-11-03 ENCOUNTER — Other Ambulatory Visit: Payer: Self-pay | Admitting: Family Medicine

## 2020-11-03 DIAGNOSIS — E785 Hyperlipidemia, unspecified: Secondary | ICD-10-CM

## 2020-11-05 LAB — FRUCTOSAMINE: Fructosamine: 496 umol/L — ABNORMAL HIGH (ref 205–285)

## 2020-11-07 ENCOUNTER — Encounter: Payer: Self-pay | Admitting: Adult Health

## 2020-11-07 ENCOUNTER — Other Ambulatory Visit: Payer: Self-pay

## 2020-11-07 ENCOUNTER — Other Ambulatory Visit: Payer: Self-pay | Admitting: Family Medicine

## 2020-11-07 ENCOUNTER — Ambulatory Visit: Payer: Medicare HMO | Admitting: Adult Health

## 2020-11-07 VITALS — BP 140/72 | HR 70 | Ht 64.0 in | Wt 125.0 lb

## 2020-11-07 DIAGNOSIS — I6302 Cerebral infarction due to thrombosis of basilar artery: Secondary | ICD-10-CM | POA: Diagnosis not present

## 2020-11-07 NOTE — Patient Instructions (Signed)
Continue aspirin 325 mg daily and clopidogrel 75 mg daily  and Crestor 40 mg daily for secondary stroke prevention  Stop use of aspirin around mid May and you will continue on Plavix alone at that time  You should be contacted by Dr. Arlean Hopping office in the beginning of May to schedule imaging  Continue to follow up with PCP regarding cholesterol, blood pressure and diabetes management  Maintain strict control of hypertension with blood pressure between 130-150, diabetes with hemoglobin A1c goal below 7 % and cholesterol with LDL cholesterol (bad cholesterol) goal below 70 mg/dL.       Followup in the future with me in 4 months or call earlier if needed       Thank you for coming to see [place] at [facility]. I hope we have been able to provide you high quality care today.  You may receive a patient satisfaction survey over the next few weeks. We would appreciate your feedback and comments so that we may continue to improve ourselves and the health of our patients.

## 2020-11-07 NOTE — Progress Notes (Signed)
[facility] [address] [place]. [place] [address] [phone]       HOSPITAL FOLLOW UP NOTE  [name] Date of Birth:  March 07, 1941 Medical Record Number:  [id number]   Reason for Referral:  hospital stroke follow up    SUBJECTIVE:   CHIEF COMPLAINT:  Chief Complaint  Patient presents with  . Follow-up    TR alone Pt is doing well, no complaints on neuro stand point     HPI:   [name] is a 80 y.o. female with history of DM, GERD, HLD, HTN, hyperthyroidism who presented on 10/06/2020 with balance issues and unsteady feeling for 3 days.  Personally reviewed hospitalization pertinent progress notes, lab work and imaging with summary provided.  Evaluated by Dr. 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  Diagnostic cerebral angiogram by Dr. Estanislado Pandy and recommended follow-up in 3 months for repeat CTA head/neck as well as long-term BP goal 130-150 to ensure adequate perfusion.  Placed on DAPT for 3 months then Plavix alone.  LDL 99.  A1c 8.0.  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  No prior stroke history.  Other active problems include vascularized lymph node incidentally found on carotid ultrasound and is asymptomatic, recommended follow-up outpatient.  Evaluated by therapies recommended outpatient PT and discharged home in stable condition.  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  MRI  Small acute bilateral cerebellar and right pontine infarcts superimposed on multiple chronic posterior circulation infarcts.    MRA - Proximal occlusion of the basilar artery with distal Reconstitution. Poor visualization of  the left PCA beginning at the mid P2 level which may reflect occlusion or severely diminished flow. Heavily diseased distal right vertebral artery. 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  Carotid Doppler unremarkable.  Incidental finding of enlarged vascularized lymph node adjacent to right CCA  2D Echo EF 65-70%  LDL 99  HgbA1c 8.0  UDS negative  VTE prophylaxis - Heparin subcu  aspirin 81 mg daily prior to admission, now on aspirin 325 mg daily and clopidogrel 75 mg daily DAPT. Recommend Asprin 325 mg and Plavix 75 mg for 3 months and then Plavix 75 mg alone.  Therapy recommendations: Outpatient PT.   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  Right VA high-grade stenosis versus hypoplastic  Likely the cause of patient posterior circulation infarcts  S/p diagnostic Cerebral angiogram by Dr. Estanislado Pandy. Occluded b/l VA proximally with distal recon at C2 levels. BA occlusion distal to AICAs. Right PCOM filling both PCAs. SCAs filling from right ICA.  Dr. Estanislado Pandy recommended follow-up in 3 months with CTA head and neck.   Avoid low BP   BP goal long term 130-150     Today, 11/07/2020, Brandi Mccormick is being seen for hospital follow-up unaccompanied.  Doing well since discharge with complete resolution of stroke deficits He has returned back to all prior activities without difficulty Denies new or recurring stroke/TIA symptoms  Reports compliance on aspirin 325 mg daily and Plavix -denies bleeding or bruising Compliant on Crestor 40 mg daily -denies side effects Blood pressure today 140/72 -monitors  at home and typically 130-140s Glucose levels have been stable per patient report  No concerns at this time      ROS:   14 system review of systems performed and negative with exception of no complaints  PMH:  Past Medical History:   Diagnosis Date  . CVA (cerebral vascular accident) ([place]) 10/2020  . Diabetes ([place])   . GERD (gastroesophageal reflux disease)   . Hyperlipidemia   . Hypertension   . Hyperthyroidism   . Pneumonia   . Shingles     PSH:  Past Surgical History:  Procedure Laterality Date  . CATARACT EXTRACTION Left 2020  . COLONOSCOPY    . DENTAL SURGERY    . IR ANGIO INTRA EXTRACRAN SEL COM CAROTID INNOMINATE BILAT MOD SED  10/09/2020  . IR ANGIO VERTEBRAL SEL VERTEBRAL BILAT MOD SED  10/09/2020    Social History:  Social History   Socioeconomic History  . Marital status: Married    Spouse name: Not on file  . Number of children: Not on file  . Years of education: Not on file  . Highest education level: Not on file  Occupational History  . Occupation: [employer]--    Comment: retired  Tobacco Use  . Smoking status: Never Smoker  . Smokeless tobacco: Never Used  Vaping Use  . Vaping Use: Never used  Substance and Sexual Activity  . Alcohol use: Yes    Alcohol/week: 0.0 standard drinks    Comment: occasional wine cooler  . Drug use: No  . Sexual activity: Never    Partners: Male  Other Topics Concern  . Not on file  Social History Narrative   Exercise-- yard work,  Walking in [place] Strain: Low Risk   . Difficulty of Paying Living Expenses: Not hard at all  Food Insecurity: No Food Insecurity  . Worried About Charity fundraiser in the Last Year: Never true  . Ran Out of Food in the Last Year: Never true  Transportation Needs: No Transportation Needs  . Lack of Transportation (Medical): No  . Lack of Transportation (Non-Medical): No  Physical Activity: Not on file  Stress: Not on file  Social Connections: Not on file  Intimate Partner Violence: Not on file    Family History:  Family History  Problem Relation Age of Onset  . Diabetes Sister   . Diabetes Brother   . Diabetes Sister   . Diabetes Mother        and on  mother's side of family  . Colon cancer Mother   . Stroke Mother   . Diabetes Father        and on father's side of family  . Heart disease Neg Hx   . Rectal cancer Neg Hx   . Stomach cancer Neg Hx     Medications:   Current Outpatient Medications on File Prior to Visit  Medication Sig Dispense Refill  . amLODipine (NORVASC) 10 MG tablet Take 1 tablet by mouth once daily 90 tablet 0  . aspirin EC 325 MG EC tablet Take 1 tablet (325 mg total) by mouth daily. 30 tablet 2  . clopidogrel (PLAVIX) 75 MG tablet Take 1 tablet (75 mg total) by mouth daily. 30 tablet 3  . famotidine (PEPCID) 20 MG tablet Take 20 mg by mouth daily.    . furosemide (LASIX) 20 MG tablet Take 20 mg by mouth 2 (two) times daily as needed for fluid.    Marland Kitchen  glipiZIDE (GLUCOTROL XL) 5 MG 24 hr tablet Take 2 tablets (10 mg total) by mouth daily. 90 tablet 2  . hydroxyurea (HYDREA) 500 MG capsule Take 1 capsule by mouth once daily (Patient taking differently: Take 500 mg by mouth daily.) 90 capsule 0  . Lancets (ONETOUCH DELICA PLUS [id number]) MISC USE 1  TO CHECK GLUCOSE TWICE DAILY 100 each 3  . lisinopril (ZESTRIL) 40 MG tablet Take 1 tablet (40 mg total) by mouth daily. 90 tablet 1  . metoprolol succinate (TOPROL-XL) 25 MG 24 hr tablet Take 1 tablet (25 mg total) by mouth daily. 90 tablet 0  . ONETOUCH VERIO test strip USE 1 STRIP TO CHECK GLUCOSE ONCE DAILY 50 each 11  . pantoprazole (PROTONIX) 40 MG tablet Take 1 tablet (40 mg total) by mouth daily. 30 tablet 0  . rosuvastatin (CRESTOR) 40 MG tablet Take 1 tablet (40 mg total) by mouth daily. 90 tablet 0   No current facility-administered medications on file prior to visit.    Allergies:   Allergies  Allergen Reactions  . Penicillins Nausea And Vomiting    Has patient had a PCN reaction causing immediate rash, facial/tongue/throat swelling, SOB or lightheadedness with hypotension: no Has patient had a PCN reaction causing severe rash involving mucus membranes or  skin necrosis: no Has patient had a PCN reaction that required hospitalization: no Has patient had a PCN reaction occurring within the last 10 years: no If all of the above answers are "NO", then may proceed with Cephalosporin use.       OBJECTIVE:  Physical Exam  Vitals:   11/07/20 1502  BP: 140/72  Pulse: 70  Weight: 125 lb (56.7 kg)  Height: 5\' 4"  (1.626 m)   Body mass index is 21.46 kg/m. No exam data present  General: well developed, well nourished,  very pleasant elderly African-American female, seated, in no evident distress Head: head normocephalic and atraumatic.   Neck: supple with no carotid or supraclavicular bruits Cardiovascular: regular rate and rhythm, no murmurs Musculoskeletal: no deformity Skin:  no rash/petichiae Vascular:  Normal pulses all extremities   Neurologic Exam Mental Status: Awake and fully alert.   Fluent speech and language.  Oriented to place and time. Recent and remote memory intact. Attention span, concentration and fund of knowledge appropriate. Mood and affect appropriate.  Cranial Nerves: Fundoscopic exam reveals sharp disc margins. Pupils equal, briskly reactive to light. Extraocular movements full without nystagmus. Visual fields full to confrontation. Hearing intact. Facial sensation intact. Face, tongue, palate moves normally and symmetrically.  Motor: Normal bulk and tone. Normal strength in all tested extremity muscles Sensory.: intact to touch , pinprick , position and vibratory sensation.  Coordination: Rapid alternating movements normal in all extremities. Finger-to-nose and heel-to-shin performed accurately bilaterally. Gait and Station: Arises from chair without difficulty. Stance is normal. Gait demonstrates normal stride length and balance with without use of assistive device. Tandem walk and heel toe without difficulty.  Romberg negative Reflexes: 1+ and symmetric. Toes downgoing.     NIHSS  0 Modified Rankin   0      ASSESSMENT: Brandi Mccormick is a 80 y.o. 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 Vascular risk factors include HTN, HLD, DM, basilar artery occlusion, polycythemia vera rubra and advanced age.      PLAN:  1. B cerebellar and R pontine stroke:  a. Recovered well without residual deficit.   b. Continue aspirin 325 mg daily and clopidogrel 75 mg daily  and Crestor 40 mg daily for secondary stroke prevention.  Stop aspirin after additional 2 months c. 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  Discussed importance of managing stroke risk factors as well as BP goal 130-150 to ensure adequate perfusion.  Plans to f/u with Dr. Estanislado Pandy around mid May for repeat CTA as advised at discharge 3. HTN: BP goal <130/90.  Stable on current regimen per PCP 4. HLD: LDL goal <70. Recent LDL 99 on Crestor 40 mg daily.  5. DMII: A1c goal<7.0. Recent A1c 8.0 monitored by endocrinology.     Follow up in 4 months or call earlier if needed   CC:  [facility] provider: Dr. Leonie Man PCP: Carollee Herter, Alferd Apa, DO    I spent 45 minutes of face-to-face and non-face-to-face time with patient.  This included previsit chart review including recent hospitalization pertinent progress notes, lab work and imaging, lab review, study review, order entry, electronic health record documentation, patient education regarding recent stroke and etiology, importance of managing stroke risk factors and answered all other questions to patient satisfaction  Frann Rider, [facility]  [facility] [address] [place], [place] [address]  Phone  [phone] Fax [phone] Note: This document was prepared with digital dictation and possible smart phrase technology. Any transcriptional errors that result from this process are unintentional.

## 2020-11-09 ENCOUNTER — Encounter: Payer: Self-pay | Admitting: Family Medicine

## 2020-11-09 ENCOUNTER — Other Ambulatory Visit: Payer: Self-pay

## 2020-11-09 ENCOUNTER — Ambulatory Visit (INDEPENDENT_AMBULATORY_CARE_PROVIDER_SITE_OTHER): Payer: Medicare HMO | Admitting: Family Medicine

## 2020-11-09 VITALS — BP 122/68 | HR 71 | Temp 98.0°F | Resp 16 | Ht 64.0 in | Wt 125.4 lb

## 2020-11-09 DIAGNOSIS — I10 Essential (primary) hypertension: Secondary | ICD-10-CM | POA: Diagnosis not present

## 2020-11-09 DIAGNOSIS — I639 Cerebral infarction, unspecified: Secondary | ICD-10-CM | POA: Diagnosis not present

## 2020-11-09 DIAGNOSIS — E7800 Pure hypercholesterolemia, unspecified: Secondary | ICD-10-CM | POA: Diagnosis not present

## 2020-11-09 DIAGNOSIS — E1165 Type 2 diabetes mellitus with hyperglycemia: Secondary | ICD-10-CM

## 2020-11-09 NOTE — Assessment & Plan Note (Signed)
Tolerating statin, encouraged heart healthy diet, avoid trans fats, minimize simple carbs and saturated fats. Increase exercise as tolerated 

## 2020-11-09 NOTE — Progress Notes (Signed)
Patient ID: Brandi Mccormick, female    DOB: 01-26-1941  Age: 80 y.o. MRN: [id number]    Subjective:  Subjective  HPI Brandi Mccormick presents for f/u bp and cholesterol   She is doing well since her stroke and has started driving on her own again   Review of Systems  Constitutional: Negative for appetite change, diaphoresis, fatigue and unexpected weight change.  Eyes: Negative for pain, redness and visual disturbance.  Respiratory: Negative for cough, chest tightness, shortness of breath and wheezing.   Cardiovascular: Negative for chest pain, palpitations and leg swelling.  Endocrine: Negative for cold intolerance, heat intolerance, polydipsia, polyphagia and polyuria.  Genitourinary: Negative for difficulty urinating, dysuria and frequency.  Neurological: Negative for dizziness, weakness, light-headedness, numbness and headaches.    History Past Medical History:  Diagnosis Date  . CVA (cerebral vascular accident) ([place]) 10/2020  . Diabetes ([place])   . GERD (gastroesophageal reflux disease)   . Hyperlipidemia   . Hypertension   . Hyperthyroidism   . Pneumonia   . Shingles     She has a past surgical history that includes Dental surgery; Colonoscopy; Cataract extraction (Left, 2020); IR ANGIO INTRA EXTRACRAN SEL COM CAROTID INNOMINATE BILAT MOD SED (10/09/2020); and IR ANGIO VERTEBRAL SEL VERTEBRAL BILAT MOD SED (10/09/2020).   Her family history includes Colon cancer in her mother; Diabetes in her brother, father, mother, sister, and sister; Stroke in her mother.She reports that she has never smoked. She has never used smokeless tobacco. She reports current alcohol use. She reports that she does not use drugs.  Current Outpatient Medications on File Prior to Visit  Medication Sig Dispense Refill  . amLODipine (NORVASC) 10 MG tablet Take 1 tablet by mouth once daily 90 tablet 0  . aspirin EC 325 MG EC tablet Take 1 tablet (325 mg total) by mouth daily. 30 tablet 2  . clopidogrel  (PLAVIX) 75 MG tablet Take 1 tablet (75 mg total) by mouth daily. 30 tablet 3  . famotidine (PEPCID) 20 MG tablet Take 20 mg by mouth daily.    Marland Kitchen glipiZIDE (GLUCOTROL XL) 5 MG 24 hr tablet Take 2 tablets (10 mg total) by mouth daily. 90 tablet 2  . hydroxyurea (HYDREA) 500 MG capsule Take 1 capsule by mouth once daily (Patient taking differently: Take 500 mg by mouth daily.) 90 capsule 0  . Lancets (ONETOUCH DELICA PLUS [id number]) MISC USE 1  TO CHECK GLUCOSE TWICE DAILY 100 each 3  . lisinopril (ZESTRIL) 40 MG tablet Take 1 tablet (40 mg total) by mouth daily. 90 tablet 1  . metoprolol succinate (TOPROL-XL) 25 MG 24 hr tablet Take 1 tablet by mouth once daily 90 tablet 1  . ONETOUCH VERIO test strip USE 1 STRIP TO CHECK GLUCOSE ONCE DAILY 50 each 11  . pantoprazole (PROTONIX) 40 MG tablet Take 1 tablet (40 mg total) by mouth daily. 30 tablet 0  . rosuvastatin (CRESTOR) 40 MG tablet Take 1 tablet (40 mg total) by mouth daily. 90 tablet 0   No current facility-administered medications on file prior to visit.     Objective:  Objective  Physical Exam Vitals and nursing note reviewed.  Constitutional:      Appearance: She is well-developed.  HENT:     Head: Normocephalic and atraumatic.  Eyes:     Conjunctiva/sclera: Conjunctivae normal.  Neck:     Thyroid: No thyromegaly.     Vascular: No carotid bruit or JVD.  Cardiovascular:     Rate and  Rhythm: Normal rate and regular rhythm.     Heart sounds: Normal heart sounds. No murmur heard.   Pulmonary:     Effort: Pulmonary effort is normal. No respiratory distress.     Breath sounds: Normal breath sounds. No wheezing or rales.  Chest:     Chest wall: No tenderness.  Musculoskeletal:     Cervical back: Normal range of motion and neck supple.  Neurological:     Mental Status: She is alert and oriented to person, place, and time.    BP 122/68 (BP Location: Right Arm, Patient Position: Sitting, Cuff Size: Normal)   Pulse 71   Temp 98  F (36.7 C) (Oral)   Resp 16   Ht 5\' 4"  (1.626 m)   Wt 125 lb 6.4 oz (56.9 kg)   SpO2 99%   BMI 21.52 kg/m  Wt Readings from Last 3 Encounters:  11/09/20 125 lb 6.4 oz (56.9 kg)  11/07/20 125 lb (56.7 kg)  11/02/20 127 lb (57.6 kg)     Lab Results  Component Value Date   WBC 7.9 10/26/2020   HGB 12.6 10/26/2020   HCT 38.3 10/26/2020   PLT 429 (H) 10/26/2020   GLUCOSE 138 (H) 10/09/2020   CHOL 172 10/07/2020   TRIG 102 10/07/2020   HDL 53 10/07/2020   LDLCALC 99 10/07/2020   ALT 13 10/09/2020   AST 14 (L) 10/09/2020   NA 141 10/09/2020   K 4.0 10/09/2020   CL 115 (H) 10/09/2020   CREATININE 2.05 (H) 10/09/2020   BUN 25 (H) 10/09/2020   CO2 19 (L) 10/09/2020   TSH 0.37 11/02/2020   INR 1.0 10/09/2020   HGBA1C 8.0 (H) 10/07/2020   MICROALBUR 46.2 (H) 11/04/2019    DG Chest 2 View  Result Date: 10/06/2020 CLINICAL DATA:  80 year old female with dizziness. EXAM: CHEST - 2 VIEW COMPARISON:  Chest radiograph dated 06/24/2014 FINDINGS: No focal consolidation, pleural effusion or pneumothorax. The cardiac silhouette is within limits. Atherosclerotic calcification of the aorta. No acute osseous pathology. IMPRESSION: No active cardiopulmonary disease. Electronically Signed   By: Anner Crete M.D.   On: 10/06/2020 16:18   CT HEAD WO CONTRAST  Result Date: 10/06/2020 CLINICAL DATA:  Neuro deficit. EXAM: CT HEAD WITHOUT CONTRAST TECHNIQUE: Contiguous axial images were obtained from the base of the skull through the vertex without intravenous contrast. COMPARISON:  None. FINDINGS: Brain: Focal low density is seen involving the inferior portion of the left cerebellum medially concerning for acute infarction. Ventricular size is within normal limits. No midline shift is noted. No hemorrhage is noted. Vascular: No hyperdense vessel or unexpected calcification. Skull: Normal. Negative for fracture or focal lesion. Sinuses/Orbits: No acute finding. Other: None. IMPRESSION: Focal low  density is seen involving the inferior portion of the left cerebellum medially concerning for acute infarction. MRI is recommended for further evaluation. Electronically Signed   By: Marijo Conception M.D.   On: 10/06/2020 12:48   MR ANGIO HEAD WO CONTRAST  Result Date: 10/06/2020 CLINICAL DATA:  Dizziness. EXAM: MRI HEAD WITHOUT CONTRAST MRA HEAD WITHOUT CONTRAST TECHNIQUE: Multiplanar, multiecho pulse sequences of the brain and surrounding structures were obtained without intravenous contrast. Angiographic images of the head were obtained using MRA technique without contrast. COMPARISON:  None. FINDINGS: MRI HEAD FINDINGS Brain: There are small acute bilateral cerebellar infarcts with the largest measuring 2 cm medially in the left cerebellar hemisphere, and these are superimposed on left larger than right chronic cerebellar infarcts. There is also  a subcentimeter acute infarct in the right pons with a chronic right paramedian pontine infarct noted as well. Chronic microhemorrhages are noted in the anterior right temporal lobe, left caudate nucleus, and left parietal lobe. T2 hyperintensities in the cerebral white matter bilaterally are nonspecific but compatible with mild chronic small vessel ischemic disease. There are chronic lacunar infarcts in the right thalamus and left genu of the corpus callosum. There is mild cerebral atrophy. Vascular: Abnormal appearance of the distal right vertebral artery and basilar artery as evaluated in greater detail below. Skull and upper cervical spine: Unremarkable bone marrow signal. Sinuses/Orbits: Left cataract extraction. Minimal mucosal thickening in the right ethmoid and right maxillary sinuses. Trace left mastoid fluid. Other: None. MRA HEAD FINDINGS The visualized portion of the distal left vertebral artery is patent and dominant. The distal right vertebral artery is small with evidence of irregular narrowing and reduced flow. There is occlusion of the proximal basilar  artery near the level of the AICA origins with reconstitution of the distal basilar artery. There are large right and moderate-sized left posterior communicating arteries. The right P1 segment is small. The left P1 segment is not visualized and may be absent or occluded. There is moderate irregularity and narrowing of distal right PCA branch vessels without evidence of a significant proximal right PCA stenosis. There is an attenuated appearance of the left PCA with visible flow in the left posterior communicating artery and proximal P2 segment, however minimal to no flow is apparent in the more distal left PCA. The internal carotid arteries are patent from skull base to carotid termini with mild irregularity but no significant stenosis. The MCAs are patent without evidence of a proximal branch occlusion. There is mild irregular narrowing of the M1 and proximal M2 segments bilaterally. The right ACA is patent with a mild to moderate stenosis noted in the proximal A1 segment. There is a severe proximal left A1 stenosis, and there is occlusion of the left A2 segment proximally. No aneurysm is identified. IMPRESSION: 1. 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 2. Mild chronic small vessel ischemia in the cerebral white matter. 3. 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 4. Poor visualization of the left PCA beginning at the mid P2 level which may reflect occlusion or severely diminished flow. 5. Heavily diseased distal right vertebral artery. 6. 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 Electronically Signed   By: Logan Bores M.D.   On: 10/06/2020 16:09   MR BRAIN WO CONTRAST  Result Date: 10/06/2020 CLINICAL DATA:  Dizziness. EXAM: MRI HEAD WITHOUT CONTRAST MRA HEAD WITHOUT CONTRAST TECHNIQUE: Multiplanar, multiecho pulse sequences of the brain and surrounding structures were obtained without  intravenous contrast. Angiographic images of the head were obtained using MRA technique without contrast. COMPARISON:  None. FINDINGS: MRI HEAD FINDINGS Brain: There are small acute bilateral cerebellar infarcts with the largest measuring 2 cm medially in the left cerebellar hemisphere, and these are superimposed on left larger than right chronic cerebellar infarcts. There is also a subcentimeter acute infarct in the right pons with a chronic right paramedian pontine infarct noted as well. Chronic microhemorrhages are noted in the anterior right temporal lobe, left caudate nucleus, and left parietal lobe. T2 hyperintensities in the cerebral white matter bilaterally are nonspecific but compatible with mild chronic small vessel ischemic disease. There are chronic lacunar infarcts in the right thalamus and left genu of the corpus callosum. There is mild cerebral  atrophy. Vascular: Abnormal appearance of the distal right vertebral artery and basilar artery as evaluated in greater detail below. Skull and upper cervical spine: Unremarkable bone marrow signal. Sinuses/Orbits: Left cataract extraction. Minimal mucosal thickening in the right ethmoid and right maxillary sinuses. Trace left mastoid fluid. Other: None. MRA HEAD FINDINGS The visualized portion of the distal left vertebral artery is patent and dominant. The distal right vertebral artery is small with evidence of irregular narrowing and reduced flow. 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 There are large right and moderate-sized left posterior communicating arteries. The right P1 segment is small. The left P1 segment is not visualized and may be absent or occluded. There is moderate irregularity and narrowing of distal right PCA branch vessels without evidence of a significant proximal right PCA stenosis. There is an attenuated appearance of the left PCA with visible  flow in the left posterior communicating artery and proximal P2 segment, however minimal to no flow is apparent in the more distal left PCA. The internal carotid arteries are patent from skull base to carotid termini with mild irregularity but no significant stenosis. The MCAs are patent without evidence of a proximal branch occlusion. There is mild irregular narrowing of the M1 and proximal M2 segments bilaterally. The right ACA is patent with a mild to moderate stenosis noted in the proximal A1 segment. There is a severe proximal left A1 stenosis, and there is occlusion of the left A2 segment proximally. No aneurysm is identified. IMPRESSION: 1. 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 2. Mild chronic small vessel ischemia in the cerebral white matter. 3. 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 4. Poor visualization of the left PCA beginning at the mid P2 level which may reflect occlusion or severely diminished flow. 5. Heavily diseased distal right vertebral artery. 6. 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 Electronically Signed   By: Logan Bores M.D.   On: 10/06/2020 16:09   ECHOCARDIOGRAM COMPLETE  Result Date: 10/08/2020    ECHOCARDIOGRAM REPORT   Patient Name:   Brandi Mccormick Date of Exam: 10/06/2020 Medical Rec #:  [id number]        Height:       64.0 in Accession #:    [id number]       Weight:       127.6 lb Date of Birth:  05-May-1941        BSA:          1.616 m Patient Age:    88 years         BP:           133/77 mmHg Patient Gender: F                HR:           69 bpm. Exam Location:  Inpatient Procedure: 2D Echo Indications:    stroke 434.91  History:        Patient has prior history of Echocardiogram examinations. Risk                 Factors:Diabetes, Hypertension and Dyslipidemia.  Sonographer:    Johny Chess Referring Phys: [id number] [place]  1.  Left ventricular ejection fraction, by estimation, is 65 to 70%. The left ventricle has normal function. The left ventricle has no regional wall motion abnormalities.  Left ventricular diastolic parameters are consistent with Grade I diastolic dysfunction (impaired relaxation).  2. Right ventricular systolic function is normal. The right ventricular size is normal. There is normal pulmonary artery systolic pressure.  3. The mitral valve is normal in structure. Trivial mitral valve regurgitation. No evidence of mitral stenosis.  4. The aortic valve is normal in structure. Aortic valve regurgitation is not visualized. No aortic stenosis is present.  5. The inferior vena cava is normal in size with greater than 50% respiratory variability, suggesting right atrial pressure of 3 mmHg. Conclusion(s)/Recommendation(s): No intracardiac source of embolism detected on this transthoracic study. A transesophageal echocardiogram is recommended to exclude cardiac source of embolism if clinically indicated. FINDINGS  Left Ventricle: Left ventricular ejection fraction, by estimation, is 65 to 70%. The left ventricle has normal function. The left ventricle has no regional wall motion abnormalities. The left ventricular internal cavity size was normal in size. There is  no left ventricular hypertrophy. Left ventricular diastolic parameters are consistent with Grade I diastolic dysfunction (impaired relaxation). Normal left ventricular filling pressure. Right Ventricle: The right ventricular size is normal. No increase in right ventricular wall thickness. Right ventricular systolic function is normal. There is normal pulmonary artery systolic pressure. The tricuspid regurgitant velocity is 2.12 m/s, and  with an assumed right atrial pressure of 3 mmHg, the estimated right ventricular systolic pressure is 02.7 mmHg. Left Atrium: Left atrial size was normal in size. Right Atrium: Right atrial size was normal in size. Pericardium: There  is no evidence of pericardial effusion. Mitral Valve: The mitral valve is normal in structure. Trivial mitral valve regurgitation. No evidence of mitral valve stenosis. Tricuspid Valve: The tricuspid valve is normal in structure. Tricuspid valve regurgitation is mild . No evidence of tricuspid stenosis. Aortic Valve: The aortic valve is normal in structure. Aortic valve regurgitation is not visualized. No aortic stenosis is present. Pulmonic Valve: The pulmonic valve was normal in structure. Pulmonic valve regurgitation is not visualized. No evidence of pulmonic stenosis. Aorta: The aortic root is normal in size and structure. Venous: The inferior vena cava is normal in size with greater than 50% respiratory variability, suggesting right atrial pressure of 3 mmHg. IAS/Shunts: No atrial level shunt detected by color flow Doppler.  LEFT VENTRICLE PLAX 2D LVIDd:         3.40 cm Diastology LVIDs:         2.10 cm LV e' medial:    7.51 cm/s LV PW:         0.90 cm LV E/e' medial:  8.2 LV IVS:        0.90 cm LV e' lateral:   9.68 cm/s                        LV E/e' lateral: 6.3  RIGHT VENTRICLE             IVC RV S prime:     12.00 cm/s  IVC diam: 1.00 cm TAPSE (M-mode): 1.8 cm LEFT ATRIUM             Index       RIGHT ATRIUM           Index LA diam:        3.20 cm 1.98 cm/m  RA Area:     10.00 cm LA Vol (A2C):   11.2 ml 6.93 ml/m  RA Volume:   20.40 ml  12.62 ml/m LA Vol (A4C):   22.5  ml 13.92 ml/m LA Biplane Vol: 16.3 ml 10.09 ml/m  AORTIC VALVE LVOT Vmax:   90.60 cm/s LVOT Vmean:  61.200 cm/s LVOT VTI:    0.193 m  AORTA Ao Root diam: 2.50 cm Ao Asc diam:  2.70 cm MITRAL VALVE               TRICUSPID VALVE MV Area (PHT): 2.60 cm    TR Peak grad:   18.0 mmHg MV Decel Time: 292 msec    TR Vmax:        212.00 cm/s MV E velocity: 61.30 cm/s MV A velocity: 88.70 cm/s  SHUNTS MV E/A ratio:  0.69        Systemic VTI: 0.19 m Ena Dawley MD Electronically signed by Ena Dawley MD Signature Date/Time:  10/08/2020/3:08:22 PM    Final    VAS US CAROTID (at [facility] and [facility] only)  Result Date: 10/08/2020 Carotid Arterial Duplex Study Indications:       CVA and Gait. Risk Factors:      Hypertension, hyperlipidemia, Diabetes. Other Factors:     Dizziness. Comparison Study:  No prior study Performing Technologist: Sharion Dove RVS  Examination Guidelines: A complete evaluation includes B-mode imaging, spectral Doppler, color Doppler, and power Doppler as needed of all accessible portions of each vessel. Bilateral testing is considered an integral part of a complete examination. Limited examinations for reoccurring indications may be performed as noted.  Right Carotid Findings: +----------+--------+--------+--------+------------------+--------+           PSV cm/sEDV cm/sStenosisPlaque DescriptionComments +----------+--------+--------+--------+------------------+--------+ CCA Prox  98      16              homogeneous                +----------+--------+--------+--------+------------------+--------+ CCA Distal71      20              heterogenous               +----------+--------+--------+--------+------------------+--------+ ICA Prox  76      22              heterogenous               +----------+--------+--------+--------+------------------+--------+ ICA Distal93      28                                         +----------+--------+--------+--------+------------------+--------+ ECA       172     7                                          +----------+--------+--------+--------+------------------+--------+ +----------+--------+-------+--------+-------------------+           PSV cm/sEDV cmsDescribeArm Pressure (mmHG) +----------+--------+-------+--------+-------------------+ [id number]                                         +----------+--------+-------+--------+-------------------+ +---------+--------+---+--------+--+ VertebralPSV cm/s120EDV cm/s32  +---------+--------+---+--------+--+  Left Carotid Findings: +----------+--------+--------+--------+---------------------+------------------+           PSV cm/sEDV cm/sStenosisPlaque Description   Comments           +----------+--------+--------+--------+---------------------+------------------+ CCA Prox  105     20  intimal thickening +----------+--------+--------+--------+---------------------+------------------+ CCA Distal76      19                                   intimal thickening +----------+--------+--------+--------+---------------------+------------------+ ICA Prox  150     27              calcific and                                                              irregular                               +----------+--------+--------+--------+---------------------+------------------+ ICA Mid   155     34                                                      +----------+--------+--------+--------+---------------------+------------------+ ICA Distal82      29                                                      +----------+--------+--------+--------+---------------------+------------------+ ECA       194     10                                                      +----------+--------+--------+--------+---------------------+------------------+ +----------+--------+--------+--------+-------------------+           PSV cm/sEDV cm/sDescribeArm Pressure (mmHG) +----------+--------+--------+--------+-------------------+ [id number]                                          +----------+--------+--------+--------+-------------------+ +---------+--------+---+--------+--+ VertebralPSV cm/s161EDV cm/s28 +---------+--------+---+--------+--+   Summary: Right Carotid: Velocities in the right ICA are consistent with a 1-39% stenosis.                There is an enlarged, vascularized lymph node 1.52cm X 1.92 cm                 noted adjacent to the right common carotid artery. Left Carotid: Velocities in the left ICA are consistent with a 1-39% stenosis. Vertebrals:  Bilateral vertebral arteries demonstrate antegrade flow. Subclavians: Normal flow hemodynamics were seen in bilateral subclavian              arteries. *See table(s) above for measurements and observations.  Electronically signed by Ena Dawley MD on 10/08/2020 at 3:09:22 PM.    Final      Assessment & Plan:  Plan  I have discontinued Brandi Mccormick's furosemide. I am also having her maintain her famotidine, OneTouch Verio, glipiZIDE, lisinopril, hydroxyurea, amLODipine, clopidogrel, aspirin, pantoprazole, OneTouch Delica Plus [id number], rosuvastatin, and metoprolol succinate.  No orders of the defined types were placed in this encounter.   Problem List Items Addressed This Visit      Unprioritized   Acute CVA (cerebrovascular accident) ([place]) - Primary   Essential hypertension    Well controlled, no changes to meds. Encouraged heart healthy diet such as the DASH diet and exercise as tolerated.       Relevant Orders   Lipid panel   Comprehensive metabolic panel   Hyperlipidemia    Tolerating statin, encouraged heart healthy diet, avoid trans fats, minimize simple carbs and saturated fats. Increase exercise as tolerated      Relevant Orders   Lipid panel   Comprehensive metabolic panel   Uncontrolled type 2 diabetes mellitus with hyperglycemia (Brandi Mccormick)    Per endo          Follow-up: Return in about 4 weeks (around 12/07/2020), or if symptoms worsen or fail to improve, for lab only 4 weeks and f/u 3 months .  [name], DO

## 2020-11-09 NOTE — Assessment & Plan Note (Signed)
Well controlled, no changes to meds. Encouraged heart healthy diet such as the DASH diet and exercise as tolerated.  °

## 2020-11-09 NOTE — Assessment & Plan Note (Signed)
No residual weakness  Pt is driving again F/u neuro

## 2020-11-09 NOTE — Assessment & Plan Note (Signed)
Per endo °

## 2020-11-09 NOTE — Patient Instructions (Signed)

## 2020-11-10 ENCOUNTER — Other Ambulatory Visit: Payer: Self-pay | Admitting: Family

## 2020-11-13 ENCOUNTER — Other Ambulatory Visit: Payer: Self-pay | Admitting: Internal Medicine

## 2020-11-19 NOTE — Progress Notes (Signed)
I agree with the above plan 

## 2020-11-29 ENCOUNTER — Other Ambulatory Visit: Payer: Self-pay

## 2020-11-29 ENCOUNTER — Ambulatory Visit (INDEPENDENT_AMBULATORY_CARE_PROVIDER_SITE_OTHER): Payer: Medicare HMO | Admitting: Podiatry

## 2020-11-29 ENCOUNTER — Encounter: Payer: Self-pay | Admitting: Podiatry

## 2020-11-29 DIAGNOSIS — M79674 Pain in right toe(s): Secondary | ICD-10-CM | POA: Diagnosis not present

## 2020-11-29 DIAGNOSIS — E119 Type 2 diabetes mellitus without complications: Secondary | ICD-10-CM

## 2020-11-29 DIAGNOSIS — B351 Tinea unguium: Secondary | ICD-10-CM

## 2020-11-29 DIAGNOSIS — M79675 Pain in left toe(s): Secondary | ICD-10-CM | POA: Diagnosis not present

## 2020-11-30 NOTE — Progress Notes (Signed)
ANNUAL DIABETIC FOOT EXAM  Subjective: Brandi Mccormick presents today for for annual diabetic foot examination and painful thick toenails that are difficult to trim. Pain interferes with ambulation. Aggravating factors include wearing enclosed shoe gear. Pain is relieved with periodic professional debridement..  Patient relates 5 year h/o diabetes.  Patient denies h/o foot wounds.  Patient denies symptoms of foot numbness.  Patient denies symptoms of foot tingling.  Patient denies symptoms of burning in feet.  Patient denies symptoms of pins/needles in feet.  Patient's blood sugar was 132 mg/dl yesterday morning.   Patient did not check blood glucose this morning.  Brandi Held, DO is patient's PCP. Last visit was one week ago..  Past Medical History:  Diagnosis Date  . CVA (cerebral vascular accident) ([place]) 10/2020  . Diabetes ([place])   . GERD (gastroesophageal reflux disease)   . Hyperlipidemia   . Hypertension   . Hyperthyroidism   . Pneumonia   . Shingles    Patient Active Problem List   Diagnosis Date Noted  . Acute CVA (cerebrovascular accident) ([place]) 10/06/2020  . Preventative health care 05/06/2019  . Uncontrolled type 2 diabetes mellitus with hyperglycemia ([place]) 05/06/2019  . Dizziness 11/05/2018  . DM (diabetes mellitus) type II uncontrolled, periph vascular disorder ([place]) 09/11/2018  . Hyperlipidemia associated with type 2 diabetes mellitus ([place]) 04/02/2018  . 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 Pneumonia   . Essential hypertension 01/14/2017  . Hyperlipidemia 11/15/2016  . Polycythemia vera ([place]) 07/01/2014  . Protein-calorie malnutrition, severe ([place]) 06/25/2014  . Protein-calorie malnutrition ([place]) 06/24/2014  . Hypokalemia 06/24/2014  . CAP (community acquired pneumonia) 06/24/2014  . H/O hyperthyroidism 12/21/2013  . Rash 01/24/2013   Past Surgical History:  Procedure Laterality Date  . CATARACT EXTRACTION Left 2020  . COLONOSCOPY    .  DENTAL SURGERY    . IR ANGIO INTRA EXTRACRAN SEL COM CAROTID INNOMINATE BILAT MOD SED  10/09/2020  . IR ANGIO VERTEBRAL SEL VERTEBRAL BILAT MOD SED  10/09/2020   Current Outpatient Medications on File Prior to Visit  Medication Sig Dispense Refill  . amLODipine (NORVASC) 10 MG tablet Take 1 tablet by mouth once daily 90 tablet 0  . aspirin EC 325 MG EC tablet Take 1 tablet (325 mg total) by mouth daily. 30 tablet 2  . clopidogrel (PLAVIX) 75 MG tablet Take 1 tablet (75 mg total) by mouth daily. 30 tablet 3  . famotidine (PEPCID) 20 MG tablet Take 20 mg by mouth daily.    Marland Kitchen glipiZIDE (GLUCOTROL XL) 5 MG 24 hr tablet Take 2 tablets (10 mg total) by mouth daily. 90 tablet 2  . hydroxyurea (HYDREA) 500 MG capsule Take 1 capsule by mouth once daily (Patient taking differently: Take 500 mg by mouth daily.) 90 capsule 0  . Lancets (ONETOUCH DELICA PLUS [id number]) MISC USE 1  TO CHECK GLUCOSE TWICE DAILY 100 each 3  . lisinopril (ZESTRIL) 40 MG tablet Take 1 tablet (40 mg total) by mouth daily. 90 tablet 1  . metoprolol succinate (TOPROL-XL) 25 MG 24 hr tablet Take 1 tablet by mouth once daily 90 tablet 1  . ONETOUCH VERIO test strip USE 1 STRIP TO CHECK GLUCOSE ONCE DAILY 50 each 11  . pantoprazole (PROTONIX) 40 MG tablet Take 1 tablet by mouth once daily 90 tablet 0  . rosuvastatin (CRESTOR) 40 MG tablet Take 1 tablet (40 mg total) by mouth daily. 90 tablet 0   No current facility-administered medications on file prior to visit.  Allergies  Allergen Reactions  . Penicillins Nausea And Vomiting    Has patient had a PCN reaction causing immediate rash, facial/tongue/throat swelling, SOB or lightheadedness with hypotension: no Has patient had a PCN reaction causing severe rash involving mucus membranes or skin necrosis: no Has patient had a PCN reaction that required hospitalization: no Has patient had a PCN reaction occurring within the last 10 years: no If all of the above answers are "NO", then  may proceed with Cephalosporin use.    Social History   Occupational History  . Occupation: [employer]--    Comment: retired  Tobacco Use  . Smoking status: Never Smoker  . Smokeless tobacco: Never Used  Vaping Use  . Vaping Use: Never used  Substance and Sexual Activity  . Alcohol use: Yes    Alcohol/week: 0.0 standard drinks    Comment: occasional wine cooler  . Drug use: No  . Sexual activity: Never    Partners: Male   Family History  Problem Relation Age of Onset  . Diabetes Sister   . Diabetes Brother   . Diabetes Sister   . Diabetes Mother        and on mother's side of family  . Colon cancer Mother   . Stroke Mother   . Diabetes Father        and on father's side of family  . Heart disease Neg Hx   . Rectal cancer Neg Hx   . Stomach cancer Neg Hx    Immunization History  Administered Date(s) Administered  . PFIZER(Purple Top)SARS-COV-2 Vaccination 10/31/2019, 11/24/2019, 08/01/2020  . Pneumococcal Polysaccharide-23 11/16/2013     Review of Systems: Negative except as noted in the HPI.  Objective: There were no vitals filed for this visit.  Brandi Mccormick is a pleasant 80 y.o. female in NAD. AAO X 3.  Vascular Examination: Capillary refill time to digits immediate b/l. Palpable pedal pulses b/l LE. Pedal hair sparse. Lower extremity skin temperature gradient within normal limits. No pain with calf compression b/l. No edema noted b/l lower extremities.  Dermatological Examination: Pedal skin is thin shiny, atrophic b/l lower extremities. No open wounds bilaterally. No interdigital macerations bilaterally. Toenails 1-5 b/l elongated, discolored, dystrophic, thickened, crumbly with subungual debris and tenderness to dorsal palpation.  Musculoskeletal Examination: Normal muscle strength 5/5 to all lower extremity muscle groups bilaterally. No pain crepitus or joint limitation noted with ROM b/l. No gross bony deformities bilaterally.  Footwear Assessment: Does  the patient wear appropriate shoes? Yes. Does the patient need inserts/orthotics? No.  Neurological Examination: Protective sensation intact 5/5 intact bilaterally with 10g monofilament b/l. Vibratory sensation intact b/l.  Hemoglobin A1C Latest Ref Rng & Units 10/07/2020 06/29/2020 02/24/2020  HGBA1C 4.8 - 5.6 % 8.0(H) 7.3(A) 7.2(A)  Some recent data might be hidden   Assessment: 1. Pain due to onychomycosis of toenails of both feet   2. Controlled type 2 diabetes mellitus without complication, without long-term current use of insulin ([place])   3. Encounter for diabetic foot exam ([place])     ADA Risk Categorization: Low Risk :  Patient has all of the following: Intact protective sensation No prior foot ulcer  No severe deformity Pedal pulses present  Plan: -Examined patient. -Diabetic foot examination performed on today's visit. -Continue diabetic foot care principles. -Patient to continue soft, supportive shoe gear daily. -Toenails 1-5 b/l were debrided in length and girth with sterile nail nippers and dremel without iatrogenic bleeding.  -Patient to report any pedal injuries to  medical professional immediately. -Patient/POA to call should there be question/concern in the interim.  Return in about 3 months (around 03/01/2021).  Marzetta Board, DPM

## 2020-12-07 ENCOUNTER — Other Ambulatory Visit: Payer: Self-pay

## 2020-12-07 ENCOUNTER — Other Ambulatory Visit (INDEPENDENT_AMBULATORY_CARE_PROVIDER_SITE_OTHER): Payer: Medicare HMO

## 2020-12-07 DIAGNOSIS — E7800 Pure hypercholesterolemia, unspecified: Secondary | ICD-10-CM | POA: Diagnosis not present

## 2020-12-07 DIAGNOSIS — I10 Essential (primary) hypertension: Secondary | ICD-10-CM | POA: Diagnosis not present

## 2020-12-07 LAB — LIPID PANEL
Cholesterol: 163 mg/dL (ref 0–200)
HDL: 56.7 mg/dL (ref >39.00)
LDL Cholesterol: 88 mg/dL (ref 0–99)
NonHDL: 106.79
Total CHOL/HDL Ratio: 3
Triglycerides: 94 mg/dL (ref 0.0–149.0)
VLDL: 18.8 mg/dL (ref 0.0–40.0)

## 2020-12-07 LAB — COMPREHENSIVE METABOLIC PANEL
ALT: 14 U/L (ref 0–35)
AST: 14 U/L (ref 0–37)
Albumin: 4 g/dL (ref 3.5–5.2)
Alkaline Phosphatase: 57 U/L (ref 39–117)
BUN: 27 mg/dL — ABNORMAL HIGH (ref 6–23)
CO2: 24 mEq/L (ref 19–32)
Calcium: 9.2 mg/dL (ref 8.4–10.5)
Chloride: 110 mEq/L (ref 96–112)
Creatinine, Ser: 2.65 mg/dL — ABNORMAL HIGH (ref 0.40–1.20)
GFR: 16.68 mL/min — ABNORMAL LOW (ref >60.00)
Glucose, Bld: 186 mg/dL — ABNORMAL HIGH (ref 70–99)
Potassium: 5 mEq/L (ref 3.5–5.1)
Sodium: 140 mEq/L (ref 135–145)
Total Bilirubin: 0.4 mg/dL (ref 0.2–1.2)
Total Protein: 6.4 g/dL (ref 6.0–8.3)

## 2020-12-22 ENCOUNTER — Other Ambulatory Visit: Payer: Self-pay | Admitting: Internal Medicine

## 2020-12-22 ENCOUNTER — Other Ambulatory Visit: Payer: Self-pay | Admitting: Family Medicine

## 2020-12-26 ENCOUNTER — Telehealth: Payer: Self-pay | Admitting: Internal Medicine

## 2020-12-26 NOTE — Telephone Encounter (Signed)
Patient called to ask about Glipizide RX - she thought she was on one 5 mg tablet 2 times per day , but her new RX shows one 5 MG once a day. Please contact patient to advise - [phone]

## 2020-12-27 ENCOUNTER — Other Ambulatory Visit: Payer: Self-pay | Admitting: Internal Medicine

## 2020-12-27 MED ORDER — GLIPIZIDE ER 5 MG PO TB24: 10.0000 mg | ORAL_TABLET | Freq: Every day | ORAL | 3 refills | Status: DC

## 2020-12-27 NOTE — Telephone Encounter (Signed)
T, I sent a new Rx for 180 tabs so she can take 2 a day as recommended. Ty, C

## 2020-12-27 NOTE — Telephone Encounter (Signed)
Pt was on 10mg  Glipizide per last office visit informed if she starts PT to decrease to 5mg . Prescription is for 5mg  please advise on which you would like pt to take  Thank you

## 2020-12-28 NOTE — Telephone Encounter (Signed)
**  late entry** Called pt on 12/27/20 to inform her that the reason her Rx for glipizide is for 5mg  is because she will take 2 tablets/day(total 10mg ) and when she starts Physical Therapy she will only take 5mg  (1 tablet) pt understands

## 2021-01-03 ENCOUNTER — Other Ambulatory Visit: Payer: Self-pay | Admitting: Family Medicine

## 2021-01-03 DIAGNOSIS — I10 Essential (primary) hypertension: Secondary | ICD-10-CM

## 2021-01-10 ENCOUNTER — Telehealth: Payer: Self-pay

## 2021-01-10 NOTE — Telephone Encounter (Signed)
Called spoke with pt concerning received written check to provider explained to this pt the providers do not accept payments of any kind directly pt will need to call/contact pt billing and they can better guide her in how to pay her bills. Made pt aware check was destroyed and placed into secure shred box pt understands and will f/u with billing

## 2021-01-13 ENCOUNTER — Other Ambulatory Visit: Payer: Self-pay | Admitting: Oncology

## 2021-01-13 ENCOUNTER — Other Ambulatory Visit: Payer: Self-pay | Admitting: Internal Medicine

## 2021-01-19 DIAGNOSIS — E1122 Type 2 diabetes mellitus with diabetic chronic kidney disease: Secondary | ICD-10-CM | POA: Diagnosis not present

## 2021-01-19 DIAGNOSIS — I129 Hypertensive chronic kidney disease with stage 1 through stage 4 chronic kidney disease, or unspecified chronic kidney disease: Secondary | ICD-10-CM | POA: Diagnosis not present

## 2021-01-19 DIAGNOSIS — N051 Unspecified nephritic syndrome with focal and segmental glomerular lesions: Secondary | ICD-10-CM | POA: Diagnosis not present

## 2021-01-19 DIAGNOSIS — N184 Chronic kidney disease, stage 4 (severe): Secondary | ICD-10-CM | POA: Diagnosis not present

## 2021-01-19 DIAGNOSIS — D75839 Thrombocytosis, unspecified: Secondary | ICD-10-CM | POA: Diagnosis not present

## 2021-01-19 DIAGNOSIS — E785 Hyperlipidemia, unspecified: Secondary | ICD-10-CM | POA: Diagnosis not present

## 2021-01-23 ENCOUNTER — Other Ambulatory Visit: Payer: Self-pay

## 2021-01-23 ENCOUNTER — Inpatient Hospital Stay: Payer: Medicare HMO

## 2021-01-23 ENCOUNTER — Encounter: Payer: Self-pay | Admitting: Nurse Practitioner

## 2021-01-23 ENCOUNTER — Inpatient Hospital Stay: Payer: Medicare HMO | Attending: Nurse Practitioner | Admitting: Nurse Practitioner

## 2021-01-23 ENCOUNTER — Other Ambulatory Visit: Payer: Medicare HMO

## 2021-01-23 VITALS — BP 140/61 | HR 73 | Temp 97.8°F | Resp 18 | Ht 64.0 in | Wt 128.6 lb

## 2021-01-23 DIAGNOSIS — Z7902 Long term (current) use of antithrombotics/antiplatelets: Secondary | ICD-10-CM | POA: Diagnosis not present

## 2021-01-23 DIAGNOSIS — Z79899 Other long term (current) drug therapy: Secondary | ICD-10-CM | POA: Insufficient documentation

## 2021-01-23 DIAGNOSIS — E119 Type 2 diabetes mellitus without complications: Secondary | ICD-10-CM | POA: Diagnosis not present

## 2021-01-23 DIAGNOSIS — D471 Chronic myeloproliferative disease: Secondary | ICD-10-CM | POA: Diagnosis not present

## 2021-01-23 DIAGNOSIS — D45 Polycythemia vera: Secondary | ICD-10-CM | POA: Diagnosis not present

## 2021-01-23 DIAGNOSIS — I10 Essential (primary) hypertension: Secondary | ICD-10-CM | POA: Diagnosis not present

## 2021-01-23 DIAGNOSIS — Z8673 Personal history of transient ischemic attack (TIA), and cerebral infarction without residual deficits: Secondary | ICD-10-CM | POA: Insufficient documentation

## 2021-01-23 LAB — CBC WITH DIFFERENTIAL (CANCER CENTER ONLY)
Abs Immature Granulocytes: 0.01 10*3/uL (ref 0.00–0.07)
Basophils Absolute: 0 10*3/uL (ref 0.0–0.1)
Basophils Relative: 1 %
Eosinophils Absolute: 0.1 10*3/uL (ref 0.0–0.5)
Eosinophils Relative: 1 %
HCT: 32.3 % — ABNORMAL LOW (ref 36.0–46.0)
Hemoglobin: 10.7 g/dL — ABNORMAL LOW (ref 12.0–15.0)
Immature Granulocytes: 0 %
Lymphocytes Relative: 26 %
Lymphs Abs: 1.3 10*3/uL (ref 0.7–4.0)
MCH: 35.5 pg — ABNORMAL HIGH (ref 26.0–34.0)
MCHC: 33.1 g/dL (ref 30.0–36.0)
MCV: 107.3 fL — ABNORMAL HIGH (ref 80.0–100.0)
Monocytes Absolute: 0.5 10*3/uL (ref 0.1–1.0)
Monocytes Relative: 9 %
Neutro Abs: 3.2 10*3/uL (ref 1.7–7.7)
Neutrophils Relative %: 63 %
Platelet Count: 341 10*3/uL (ref 150–400)
RBC: 3.01 MIL/uL — ABNORMAL LOW (ref 3.87–5.11)
RDW: 15.9 % — ABNORMAL HIGH (ref 11.5–15.5)
WBC Count: 5 10*3/uL (ref 4.0–10.5)
nRBC: 0 % (ref 0.0–0.2)

## 2021-01-23 NOTE — Progress Notes (Signed)
  [place] OFFICE PROGRESS NOTE   Diagnosis: Polycythemia vera  INTERVAL HISTORY:   Brandi Mccormick returns as scheduled.  She continues hydroxyurea.  She feels well.  No nausea or vomiting.  No mouth sores.  No diarrhea.  No rash.  Stroke symptoms continue to be resolved.  No symptoms of thrombosis.  She denies bleeding.  No black stools.  Objective:  Vital signs in last 24 hours:  Blood pressure 140/61, pulse 73, temperature 97.8 F (36.6 C), temperature source Oral, resp. rate 18, height 5\' 4"  (1.626 m), weight 128 lb 9.6 oz (58.3 kg), SpO2 100 %.    HEENT: No thrush or ulcers. Resp: Lungs clear bilaterally. Cardio: Regular rate and rhythm. GI: Abdomen soft and nontender.  No hepatomegaly. Vascular: No leg edema.  Lab Results:  Lab Results  Component Value Date   WBC 5.0 01/23/2021   HGB 10.7 (L) 01/23/2021   HCT 32.3 (L) 01/23/2021   MCV 107.3 (H) 01/23/2021   PLT 341 01/23/2021   NEUTROABS 3.2 01/23/2021    Imaging:  No results found.  Medications: I have reviewed the patient's current medications.  Assessment/Plan: 1. 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 Type 2 diabetes 3. Hypertension 4. Focal/segmental glomerular sclerosis 5. 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  She is currently taking Hydrea 500 mg daily.  Review of the CBC from today shows progressive anemia.  Platelet count is in goal range.  I discussed with Dr. Benay Spice.  Hydrea dose will be adjusted to  500 mg Monday through Thursday, off Friday, Saturday and Sunday.  She agrees with this plan.  She will return for a CBC and follow-up visit in 4 weeks.  We are available to see her sooner if needed.    Ned Card ANP/GNP-BC   01/23/2021  9:36 AM

## 2021-02-09 ENCOUNTER — Encounter: Payer: Self-pay | Admitting: Family Medicine

## 2021-02-09 ENCOUNTER — Ambulatory Visit (INDEPENDENT_AMBULATORY_CARE_PROVIDER_SITE_OTHER): Payer: Medicare HMO | Admitting: Family Medicine

## 2021-02-09 ENCOUNTER — Other Ambulatory Visit: Payer: Self-pay

## 2021-02-09 VITALS — BP 120/60 | HR 72 | Temp 97.9°F | Resp 18 | Ht 64.0 in | Wt 127.6 lb

## 2021-02-09 DIAGNOSIS — E1169 Type 2 diabetes mellitus with other specified complication: Secondary | ICD-10-CM | POA: Diagnosis not present

## 2021-02-09 DIAGNOSIS — E1165 Type 2 diabetes mellitus with hyperglycemia: Secondary | ICD-10-CM | POA: Diagnosis not present

## 2021-02-09 DIAGNOSIS — I639 Cerebral infarction, unspecified: Secondary | ICD-10-CM

## 2021-02-09 DIAGNOSIS — I10 Essential (primary) hypertension: Secondary | ICD-10-CM | POA: Diagnosis not present

## 2021-02-09 DIAGNOSIS — D649 Anemia, unspecified: Secondary | ICD-10-CM | POA: Diagnosis not present

## 2021-02-09 DIAGNOSIS — E785 Hyperlipidemia, unspecified: Secondary | ICD-10-CM

## 2021-02-09 LAB — CBC WITH DIFFERENTIAL/PLATELET
Basophils Absolute: 0 10*3/uL (ref 0.0–0.1)
Basophils Relative: 0.8 % (ref 0.0–3.0)
Eosinophils Absolute: 0.1 10*3/uL (ref 0.0–0.7)
Eosinophils Relative: 1.5 % (ref 0.0–5.0)
HCT: 32.2 % — ABNORMAL LOW (ref 36.0–46.0)
Hemoglobin: 10.9 g/dL — ABNORMAL LOW (ref 12.0–15.0)
Lymphocytes Relative: 24 % (ref 12.0–46.0)
Lymphs Abs: 1.4 10*3/uL (ref 0.7–4.0)
MCHC: 33.8 g/dL (ref 30.0–36.0)
MCV: 109 fl — ABNORMAL HIGH (ref 78.0–100.0)
Monocytes Absolute: 0.5 10*3/uL (ref 0.1–1.0)
Monocytes Relative: 8.9 % (ref 3.0–12.0)
Neutro Abs: 3.7 10*3/uL (ref 1.4–7.7)
Neutrophils Relative %: 64.8 % (ref 43.0–77.0)
Platelets: 385 10*3/uL (ref 150.0–400.0)
RBC: 2.95 Mil/uL — ABNORMAL LOW (ref 3.87–5.11)
RDW: 16.7 % — ABNORMAL HIGH (ref 11.5–15.5)
WBC: 5.8 10*3/uL (ref 4.0–10.5)

## 2021-02-09 LAB — IBC + FERRITIN
Ferritin: 53.7 ng/mL (ref 10.0–291.0)
Iron: 108 ug/dL (ref 42–145)
Saturation Ratios: 38.4 % (ref 20.0–50.0)
Transferrin: 201 mg/dL — ABNORMAL LOW (ref 212.0–360.0)

## 2021-02-09 NOTE — Assessment & Plan Note (Signed)
hgba1c acceptable, minimize simple carbs. Increase exercise as tolerated. Continue current meds 

## 2021-02-09 NOTE — Assessment & Plan Note (Signed)
Encourage heart healthy diet such as MIND or DASH diet, increase exercise, avoid trans fats, simple carbohydrates and processed foods, consider a krill or fish or flaxseed oil cap daily.  °

## 2021-02-09 NOTE — Patient Instructions (Signed)
Stroke Prevention Some medical conditions and behaviors are associated with a higher chance of having a stroke. You can help prevent a stroke by making nutrition, lifestyle, and other changes, including managing any medical conditions you may have. What nutrition changes can be made?  Eat healthy foods. You can do this by: Choosing foods high in fiber, such as fresh fruits and vegetables and whole grains. Eating at least 5 or more servings of fruits and vegetables a day. Try to fill half of your plate at each meal with fruits and vegetables. Choosing lean protein foods, such as lean cuts of meat, poultry without skin, fish, tofu, beans, and nuts. Eating low-fat dairy products. Avoiding foods that are high in salt (sodium). This can help lower blood pressure. Avoiding foods that have saturated fat, trans fat, and cholesterol. This can help prevent high cholesterol. Avoiding processed and premade foods. Follow your health care provider's specific guidelines for losing weight, controlling high blood pressure (hypertension), lowering high cholesterol, and managing diabetes. These may include: Reducing your daily calorie intake. Limiting your daily sodium intake to 1,500 milligrams (mg). Using only healthy fats for cooking, such as olive oil, canola oil, or sunflower oil. Counting your daily carbohydrate intake. What lifestyle changes can be made? Maintain a healthy weight. Talk to your health care provider about your ideal weight. Get at least 30 minutes of moderate physical activity at least 5 days a week. Moderate activity includes brisk walking, biking, and swimming. Do not use any products that contain nicotine or tobacco, such as cigarettes and e-cigarettes. If you need help quitting, ask your health care provider. It may also be helpful to avoid exposure to secondhand smoke. Limit alcohol intake to no more than 1 drink a day for nonpregnant women and 2 drinks a day for men. One drink equals 12  oz of beer, 5 oz of wine, or 1 oz of hard liquor. Stop any illegal drug use. Avoid taking birth control pills. Talk to your health care provider about the risks of taking birth control pills if: You are over 35 years old. You smoke. You get migraines. You have ever had a blood clot. What other changes can be made? Manage your cholesterol levels. Eating a healthy diet is important for preventing high cholesterol. If cholesterol cannot be managed through diet alone, you may also need to take medicines. Take any prescribed medicines to control your cholesterol as told by your health care provider. Manage your diabetes. Eating a healthy diet and exercising regularly are important parts of managing your blood sugar. If your blood sugar cannot be managed through diet and exercise, you may need to take medicines. Take any prescribed medicines to control your diabetes as told by your health care provider. Control your hypertension. To reduce your risk of stroke, try to keep your blood pressure below 130/80. Eating a healthy diet and exercising regularly are an important part of controlling your blood pressure. If your blood pressure cannot be managed through diet and exercise, you may need to take medicines. Take any prescribed medicines to control hypertension as told by your health care provider. Ask your health care provider if you should monitor your blood pressure at home. Have your blood pressure checked every year, even if your blood pressure is normal. Blood pressure increases with age and some medical conditions. Get evaluated for sleep disorders (sleep apnea). Talk to your health care provider about getting a sleep evaluation if you snore a lot or have excessive sleepiness. Take over-the-counter   and prescription medicines only as told by your health care provider. Aspirin or blood thinners (antiplatelets or anticoagulants) may be recommended to reduce your risk of forming blood clots that can  lead to stroke. Make sure that any other medical conditions you have, such as atrial fibrillation or atherosclerosis, are managed. What are the warning signs of a stroke? The warning signs of a stroke can be easily remembered as BEFAST. B is for balance. Signs include: Dizziness. Loss of balance or coordination. Sudden trouble walking. E is for eyes. Signs include: A sudden change in vision. Trouble seeing. F is for face. Signs include: Sudden weakness or numbness of the face. The face or eyelid drooping to one side. A is for arms. Signs include: Sudden weakness or numbness of the arm, usually on one side of the body. S is for speech. Signs include: Trouble speaking (aphasia). Trouble understanding. T is for time. These symptoms may represent a serious problem that is an emergency. Do not wait to see if the symptoms will go away. Get medical help right away. Call your local emergency services (911 in the [place] Do not drive yourself to the hospital. Other signs of stroke may include: A sudden, severe headache with no known cause. Nausea or vomiting. Seizure. Where to find more information For more information, visit: American Stroke Association: www.strokeassociation.org National Stroke Association: www.stroke.org Summary You can prevent a stroke by eating healthy, exercising, not smoking, limiting alcohol intake, and managing any medical conditions you may have. Do not use any products that contain nicotine or tobacco, such as cigarettes and e-cigarettes. If you need help quitting, ask your health care provider. It may also be helpful to avoid exposure to secondhand smoke. Remember BEFAST for warning signs of stroke. Get help right away if you or a loved one has any of these signs. This information is not intended to replace advice given to you by your health care provider. Make sure you discuss any questions you have with your health care provider. Document Revised: 08/01/2017  Document Reviewed: 09/24/2016 Elsevier Patient Education  2021 [employer]  

## 2021-02-09 NOTE — Progress Notes (Signed)
Patient ID: Brandi Mccormick, female    DOB: 1941-07-20  Age: 80 y.o. MRN: [id number]    Subjective:  Subjective  HPI Brandi Mccormick presents for an office visit today. She reports feeling well and has no complaints. She states that she has been attending PT and is now able to walk and drive. She notes taking 25 mg of metoprolol succinate PO Daily, 40 mg of lisinopril PO Daily and 10 mg of amLODipine PO Daily for her dx of HTN. She endorses taking her at home BP occasionally.  BP Readings from Last 3 Encounters:  02/09/21 120/60  01/23/21 140/61  11/09/20 122/68  She denies any chest pain, SOB, fever, abdominal pain, cough, chills, sore throat, dysuria, urinary incontinence, back pain, HA, or N/V/D at this time. She endorses going to her podiatrist.   Review of Systems  Constitutional:  Negative for chills, fatigue and fever.  HENT:  Negative for ear pain, rhinorrhea, sinus pressure, sinus pain, sore throat and tinnitus.   Eyes:  Negative for pain.  Respiratory:  Negative for cough, shortness of breath and wheezing.   Cardiovascular:  Negative for chest pain.  Gastrointestinal:  Negative for abdominal pain, anal bleeding, constipation, diarrhea, nausea and vomiting.  Genitourinary:  Negative for flank pain.  Musculoskeletal:  Negative for back pain and neck pain.  Skin:  Negative for rash.  Neurological:  Negative for seizures, weakness, light-headedness, numbness and headaches.   History Past Medical History:  Diagnosis Date   CVA (cerebral vascular accident) ([place]) 10/2020   Diabetes ([place])    GERD (gastroesophageal reflux disease)    Hyperlipidemia    Hypertension    Hyperthyroidism    Pneumonia    Shingles     She has a past surgical history that includes Dental surgery; Colonoscopy; Cataract extraction (Left, 2020); IR ANGIO INTRA EXTRACRAN SEL COM CAROTID INNOMINATE BILAT MOD SED (10/09/2020); and IR ANGIO VERTEBRAL SEL VERTEBRAL BILAT MOD SED (10/09/2020).   Her family  history includes Colon cancer in her mother; Diabetes in her brother, father, mother, sister, and sister; Stroke in her mother.She reports that she has never smoked. She has never used smokeless tobacco. She reports current alcohol use. She reports that she does not use drugs.  Current Outpatient Medications on File Prior to Visit  Medication Sig Dispense Refill   amLODipine (NORVASC) 10 MG tablet Take 1 tablet by mouth once daily 90 tablet 0   aspirin EC 325 MG EC tablet Take 1 tablet (325 mg total) by mouth daily. 30 tablet 2   clopidogrel (PLAVIX) 75 MG tablet Take 1 tablet (75 mg total) by mouth daily. 30 tablet 3   famotidine (PEPCID) 20 MG tablet Take 20 mg by mouth daily.     glipiZIDE (GLUCOTROL XL) 5 MG 24 hr tablet Take 2 tablets (10 mg total) by mouth daily. 180 tablet 3   hydroxyurea (HYDREA) 500 MG capsule Take 1 capsule by mouth once daily 90 capsule 0   Lancets (ONETOUCH DELICA PLUS [id number]) MISC USE 1  TO CHECK GLUCOSE TWICE DAILY 100 each 3   lisinopril (ZESTRIL) 40 MG tablet Take 1 tablet by mouth once daily 90 tablet 0   metoprolol succinate (TOPROL-XL) 25 MG 24 hr tablet Take 1 tablet by mouth once daily 90 tablet 1   ONETOUCH VERIO test strip USE 1 STRIP TO CHECK GLUCOSE ONCE DAILY 50 each 0   pantoprazole (PROTONIX) 40 MG tablet Take 1 tablet by mouth once daily 90 tablet 0  rosuvastatin (CRESTOR) 40 MG tablet Take 1 tablet (40 mg total) by mouth daily. 90 tablet 0   No current facility-administered medications on file prior to visit.     Objective:  Objective  Physical Exam Vitals and nursing note reviewed.  Constitutional:      General: She is not in acute distress.    Appearance: Normal appearance. She is well-developed. She is not ill-appearing.  HENT:     Head: Normocephalic and atraumatic.     Right Ear: External ear normal.     Left Ear: External ear normal.     Nose: Nose normal.  Eyes:     General:        Right eye: No discharge.        Left eye:  No discharge.     Extraocular Movements: Extraocular movements intact.     Pupils: Pupils are equal, round, and reactive to light.  Cardiovascular:     Rate and Rhythm: Normal rate and regular rhythm.     Pulses: Normal pulses.     Heart sounds: Normal heart sounds. No murmur heard.   No friction rub. No gallop.  Pulmonary:     Effort: Pulmonary effort is normal. No respiratory distress.     Breath sounds: Normal breath sounds. No stridor. No wheezing, rhonchi or rales.  Chest:     Chest wall: No tenderness.  Abdominal:     General: Bowel sounds are normal. There is no distension.     Palpations: Abdomen is soft. There is no mass.     Tenderness: There is no abdominal tenderness. There is no guarding or rebound.     Hernia: No hernia is present.  Musculoskeletal:        General: Normal range of motion.     Cervical back: Normal range of motion and neck supple.     Right lower leg: No edema.     Left lower leg: No edema.  Skin:    General: Skin is warm and dry.  Neurological:     Mental Status: She is alert and oriented to person, place, and time.  Psychiatric:        Behavior: Behavior normal.        Thought Content: Thought content normal.   BP 120/60 (BP Location: Right Arm, Patient Position: Sitting, Cuff Size: Normal)   Pulse 72   Temp 97.9 F (36.6 C) (Oral)   Resp 18   Ht 5\' 4"  (1.626 m)   Wt 127 lb 9.6 oz (57.9 kg)   SpO2 99%   BMI 21.90 kg/m  Wt Readings from Last 3 Encounters:  02/09/21 127 lb 9.6 oz (57.9 kg)  01/23/21 128 lb 9.6 oz (58.3 kg)  11/09/20 125 lb 6.4 oz (56.9 kg)     Lab Results  Component Value Date   WBC 5.0 01/23/2021   HGB 10.7 (L) 01/23/2021   HCT 32.3 (L) 01/23/2021   PLT 341 01/23/2021   GLUCOSE 186 (H) 12/07/2020   CHOL 163 12/07/2020   TRIG 94.0 12/07/2020   HDL 56.70 12/07/2020   LDLCALC 88 12/07/2020   ALT 14 12/07/2020   AST 14 12/07/2020   NA 140 12/07/2020   K 5.0 12/07/2020   CL 110 12/07/2020   CREATININE 2.65 (H)  12/07/2020   BUN 27 (H) 12/07/2020   CO2 24 12/07/2020   TSH 0.37 11/02/2020   INR 1.0 10/09/2020   HGBA1C 8.0 (H) 10/07/2020   MICROALBUR 46.2 (H) 11/04/2019    DG  Chest 2 View  Result Date: 10/06/2020 CLINICAL DATA:  80 year old female with dizziness. EXAM: CHEST - 2 VIEW COMPARISON:  Chest radiograph dated 06/24/2014 FINDINGS: No focal consolidation, pleural effusion or pneumothorax. The cardiac silhouette is within limits. Atherosclerotic calcification of the aorta. No acute osseous pathology. IMPRESSION: No active cardiopulmonary disease. Electronically Signed   By: Anner Crete M.D.   On: 10/06/2020 16:18   CT HEAD WO CONTRAST  Result Date: 10/06/2020 CLINICAL DATA:  Neuro deficit. EXAM: CT HEAD WITHOUT CONTRAST TECHNIQUE: Contiguous axial images were obtained from the base of the skull through the vertex without intravenous contrast. COMPARISON:  None. FINDINGS: Brain: Focal low density is seen involving the inferior portion of the left cerebellum medially concerning for acute infarction. Ventricular size is within normal limits. No midline shift is noted. No hemorrhage is noted. Vascular: No hyperdense vessel or unexpected calcification. Skull: Normal. Negative for fracture or focal lesion. Sinuses/Orbits: No acute finding. Other: None. IMPRESSION: Focal low density is seen involving the inferior portion of the left cerebellum medially concerning for acute infarction. MRI is recommended for further evaluation. Electronically Signed   By: Marijo Conception M.D.   On: 10/06/2020 12:48   MR ANGIO HEAD WO CONTRAST  Result Date: 10/06/2020 CLINICAL DATA:  Dizziness. EXAM: MRI HEAD WITHOUT CONTRAST MRA HEAD WITHOUT CONTRAST TECHNIQUE: Multiplanar, multiecho pulse sequences of the brain and surrounding structures were obtained without intravenous contrast. Angiographic images of the head were obtained using MRA technique without contrast. COMPARISON:  None. FINDINGS: MRI HEAD FINDINGS Brain: There  are small acute bilateral cerebellar infarcts with the largest measuring 2 cm medially in the left cerebellar hemisphere, and these are superimposed on left larger than right chronic cerebellar infarcts. There is also a subcentimeter acute infarct in the right pons with a chronic right paramedian pontine infarct noted as well. Chronic microhemorrhages are noted in the anterior right temporal lobe, left caudate nucleus, and left parietal lobe. T2 hyperintensities in the cerebral white matter bilaterally are nonspecific but compatible with mild chronic small vessel ischemic disease. There are chronic lacunar infarcts in the right thalamus and left genu of the corpus callosum. There is mild cerebral atrophy. Vascular: Abnormal appearance of the distal right vertebral artery and basilar artery as evaluated in greater detail below. Skull and upper cervical spine: Unremarkable bone marrow signal. Sinuses/Orbits: Left cataract extraction. Minimal mucosal thickening in the right ethmoid and right maxillary sinuses. Trace left mastoid fluid. Other: None. MRA HEAD FINDINGS The visualized portion of the distal left vertebral artery is patent and dominant. The distal right vertebral artery is small with evidence of irregular narrowing and reduced flow. 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 There are large right and moderate-sized left posterior communicating arteries. The right P1 segment is small. The left P1 segment is not visualized and may be absent or occluded. There is moderate irregularity and narrowing of distal right PCA branch vessels without evidence of a significant proximal right PCA stenosis. There is an attenuated appearance of the left PCA with visible flow in the left posterior communicating artery and proximal P2 segment, however minimal to no flow is apparent in the more distal left PCA. The internal carotid  arteries are patent from skull base to carotid termini with mild irregularity but no significant stenosis. The MCAs are patent without evidence of a proximal branch occlusion. There is mild irregular narrowing of the M1 and  proximal M2 segments bilaterally. The right ACA is patent with a mild to moderate stenosis noted in the proximal A1 segment. There is a severe proximal left A1 stenosis, and there is occlusion of the left A2 segment proximally. No aneurysm is identified. IMPRESSION: 1. 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 2. Mild chronic small vessel ischemia in the cerebral white matter. 3. 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 4. Poor visualization of the left PCA beginning at the mid P2 level which may reflect occlusion or severely diminished flow. 5. Heavily diseased distal right vertebral artery. 6. 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 Electronically Signed   By: Logan Bores M.D.   On: 10/06/2020 16:09   MR BRAIN WO CONTRAST  Result Date: 10/06/2020 CLINICAL DATA:  Dizziness. EXAM: MRI HEAD WITHOUT CONTRAST MRA HEAD WITHOUT CONTRAST TECHNIQUE: Multiplanar, multiecho pulse sequences of the brain and surrounding structures were obtained without intravenous contrast. Angiographic images of the head were obtained using MRA technique without contrast. COMPARISON:  None. FINDINGS: MRI HEAD FINDINGS Brain: There are small acute bilateral cerebellar infarcts with the largest measuring 2 cm medially in the left cerebellar hemisphere, and these are superimposed on left larger than right chronic cerebellar infarcts. There is also a subcentimeter acute infarct in the right pons with a chronic right paramedian pontine infarct noted as well. Chronic microhemorrhages are noted in the anterior right temporal lobe, left caudate nucleus, and left parietal lobe. T2 hyperintensities in the  cerebral white matter bilaterally are nonspecific but compatible with mild chronic small vessel ischemic disease. There are chronic lacunar infarcts in the right thalamus and left genu of the corpus callosum. There is mild cerebral atrophy. Vascular: Abnormal appearance of the distal right vertebral artery and basilar artery as evaluated in greater detail below. Skull and upper cervical spine: Unremarkable bone marrow signal. Sinuses/Orbits: Left cataract extraction. Minimal mucosal thickening in the right ethmoid and right maxillary sinuses. Trace left mastoid fluid. Other: None. MRA HEAD FINDINGS The visualized portion of the distal left vertebral artery is patent and dominant. The distal right vertebral artery is small with evidence of irregular narrowing and reduced flow. 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 There are large right and moderate-sized left posterior communicating arteries. The right P1 segment is small. The left P1 segment is not visualized and may be absent or occluded. There is moderate irregularity and narrowing of distal right PCA branch vessels without evidence of a significant proximal right PCA stenosis. There is an attenuated appearance of the left PCA with visible flow in the left posterior communicating artery and proximal P2 segment, however minimal to no flow is apparent in the more distal left PCA. The internal carotid arteries are patent from skull base to carotid termini with mild irregularity but no significant stenosis. The MCAs are patent without evidence of a proximal branch occlusion. There is mild irregular narrowing of the M1 and proximal M2 segments bilaterally. The right ACA is patent with a mild to moderate stenosis noted in the proximal A1 segment. There is a severe proximal left A1 stenosis, and there is occlusion of the left A2 segment proximally. No aneurysm is identified.  IMPRESSION: 1. 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 2. Mild chronic small vessel ischemia in the cerebral white matter. 3. Proximal occlusion  of the basilar artery with distal reconstitution. 4. Poor visualization of the left PCA beginning at the mid P2 level which may reflect occlusion or severely diminished flow. 5. Heavily diseased distal right vertebral artery. 6. 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 Electronically Signed   By: Logan Bores M.D.   On: 10/06/2020 16:09   ECHOCARDIOGRAM COMPLETE  Result Date: 10/08/2020    ECHOCARDIOGRAM REPORT   Patient Name:   Brandi Mccormick Date of Exam: 10/06/2020 Medical Rec #:  [id number]        Height:       64.0 in Accession #:    [id number]       Weight:       127.6 lb Date of Birth:  November 03, 1940        BSA:          1.616 m Patient Age:    58 years         BP:           133/77 mmHg Patient Gender: F                HR:           69 bpm. Exam Location:  Inpatient Procedure: 2D Echo Indications:    stroke 434.91  History:        Patient has prior history of Echocardiogram examinations. Risk                 Factors:Diabetes, Hypertension and Dyslipidemia.  Sonographer:    Johny Chess Referring Phys: [id number] [place]  1. Left ventricular ejection fraction, by estimation, is 65 to 70%. The left ventricle has normal function. The left ventricle has no regional wall motion abnormalities. Left ventricular diastolic parameters are consistent with Grade I diastolic dysfunction (impaired relaxation).  2. Right ventricular systolic function is normal. The right ventricular size is normal. There is normal pulmonary artery systolic pressure.  3. The mitral valve is normal in structure. Trivial mitral valve regurgitation. No evidence of mitral stenosis.  4. The aortic valve is normal in structure. Aortic valve regurgitation is not visualized. No aortic  stenosis is present.  5. The inferior vena cava is normal in size with greater than 50% respiratory variability, suggesting right atrial pressure of 3 mmHg. Conclusion(s)/Recommendation(s): No intracardiac source of embolism detected on this transthoracic study. A transesophageal echocardiogram is recommended to exclude cardiac source of embolism if clinically indicated. FINDINGS  Left Ventricle: Left ventricular ejection fraction, by estimation, is 65 to 70%. The left ventricle has normal function. The left ventricle has no regional wall motion abnormalities. The left ventricular internal cavity size was normal in size. There is  no left ventricular hypertrophy. Left ventricular diastolic parameters are consistent with Grade I diastolic dysfunction (impaired relaxation). Normal left ventricular filling pressure. Right Ventricle: The right ventricular size is normal. No increase in right ventricular wall thickness. Right ventricular systolic function is normal. There is normal pulmonary artery systolic pressure. The tricuspid regurgitant velocity is 2.12 m/s, and  with an assumed right atrial pressure of 3 mmHg, the estimated right ventricular systolic pressure is 47.4 mmHg. Left Atrium: Left atrial size was normal in size. Right Atrium: Right atrial size was normal in size. Pericardium: There is no evidence of pericardial effusion. Mitral Valve: The mitral valve is normal in structure. Trivial mitral valve regurgitation. No evidence of mitral valve stenosis. Tricuspid Valve: The tricuspid valve is normal in structure. Tricuspid valve regurgitation is mild . No evidence  of tricuspid stenosis. Aortic Valve: The aortic valve is normal in structure. Aortic valve regurgitation is not visualized. No aortic stenosis is present. Pulmonic Valve: The pulmonic valve was normal in structure. Pulmonic valve regurgitation is not visualized. No evidence of pulmonic stenosis. Aorta: The aortic root is normal in size and structure.  Venous: The inferior vena cava is normal in size with greater than 50% respiratory variability, suggesting right atrial pressure of 3 mmHg. IAS/Shunts: No atrial level shunt detected by color flow Doppler.  LEFT VENTRICLE PLAX 2D LVIDd:         3.40 cm Diastology LVIDs:         2.10 cm LV e' medial:    7.51 cm/s LV PW:         0.90 cm LV E/e' medial:  8.2 LV IVS:        0.90 cm LV e' lateral:   9.68 cm/s                        LV E/e' lateral: 6.3  RIGHT VENTRICLE             IVC RV S prime:     12.00 cm/s  IVC diam: 1.00 cm TAPSE (M-mode): 1.8 cm LEFT ATRIUM             Index       RIGHT ATRIUM           Index LA diam:        3.20 cm 1.98 cm/m  RA Area:     10.00 cm LA Vol (A2C):   11.2 ml 6.93 ml/m  RA Volume:   20.40 ml  12.62 ml/m LA Vol (A4C):   22.5 ml 13.92 ml/m LA Biplane Vol: 16.3 ml 10.09 ml/m  AORTIC VALVE LVOT Vmax:   90.60 cm/s LVOT Vmean:  61.200 cm/s LVOT VTI:    0.193 m  AORTA Ao Root diam: 2.50 cm Ao Asc diam:  2.70 cm MITRAL VALVE               TRICUSPID VALVE MV Area (PHT): 2.60 cm    TR Peak grad:   18.0 mmHg MV Decel Time: 292 msec    TR Vmax:        212.00 cm/s MV E velocity: 61.30 cm/s MV A velocity: 88.70 cm/s  SHUNTS MV E/A ratio:  0.69        Systemic VTI: 0.19 m Ena Dawley MD Electronically signed by Ena Dawley MD Signature Date/Time: 10/08/2020/3:08:22 PM    Final    VAS US CAROTID (at [facility] and [facility] only)  Result Date: 10/08/2020 Carotid Arterial Duplex Study Indications:       CVA and Gait. Risk Factors:      Hypertension, hyperlipidemia, Diabetes. Other Factors:     Dizziness. Comparison Study:  No prior study Performing Technologist: Sharion Dove RVS  Examination Guidelines: A complete evaluation includes B-mode imaging, spectral Doppler, color Doppler, and power Doppler as needed of all accessible portions of each vessel. Bilateral testing is considered an integral part of a complete examination. Limited examinations for reoccurring indications may be performed as  noted.  Right Carotid Findings: +----------+--------+--------+--------+------------------+--------+           PSV cm/sEDV cm/sStenosisPlaque DescriptionComments +----------+--------+--------+--------+------------------+--------+ CCA Prox  98      16              homogeneous                +----------+--------+--------+--------+------------------+--------+  CCA Distal71      20              heterogenous               +----------+--------+--------+--------+------------------+--------+ ICA Prox  76      22              heterogenous               +----------+--------+--------+--------+------------------+--------+ ICA Distal93      28                                         +----------+--------+--------+--------+------------------+--------+ ECA       172     7                                          +----------+--------+--------+--------+------------------+--------+ +----------+--------+-------+--------+-------------------+           PSV cm/sEDV cmsDescribeArm Pressure (mmHG) +----------+--------+-------+--------+-------------------+ [id number]                                         +----------+--------+-------+--------+-------------------+ +---------+--------+---+--------+--+ VertebralPSV cm/s120EDV cm/s32 +---------+--------+---+--------+--+  Left Carotid Findings: +----------+--------+--------+--------+---------------------+------------------+           PSV cm/sEDV cm/sStenosisPlaque Description   Comments           +----------+--------+--------+--------+---------------------+------------------+ CCA Prox  105     20                                   intimal thickening +----------+--------+--------+--------+---------------------+------------------+ CCA Distal76      19                                   intimal thickening +----------+--------+--------+--------+---------------------+------------------+ ICA Prox  150     27               calcific and                                                              irregular                               +----------+--------+--------+--------+---------------------+------------------+ ICA Mid   155     34                                                      +----------+--------+--------+--------+---------------------+------------------+ ICA Distal82      29                                                      +----------+--------+--------+--------+---------------------+------------------+  ECA       194     10                                                      +----------+--------+--------+--------+---------------------+------------------+ +----------+--------+--------+--------+-------------------+           PSV cm/sEDV cm/sDescribeArm Pressure (mmHG) +----------+--------+--------+--------+-------------------+ [id number]                                          +----------+--------+--------+--------+-------------------+ +---------+--------+---+--------+--+ VertebralPSV cm/s161EDV cm/s28 +---------+--------+---+--------+--+   Summary: Right Carotid: Velocities in the right ICA are consistent with a 1-39% stenosis.                There is an enlarged, vascularized lymph node 1.52cm X 1.92 cm                noted adjacent to the right common carotid artery. Left Carotid: Velocities in the left ICA are consistent with a 1-39% stenosis. Vertebrals:  Bilateral vertebral arteries demonstrate antegrade flow. Subclavians: Normal flow hemodynamics were seen in bilateral subclavian              arteries. *See table(s) above for measurements and observations.  Electronically signed by Ena Dawley MD on 10/08/2020 at 3:09:22 PM.    Final      Assessment & Plan:  Plan    No orders of the defined types were placed in this encounter.   Problem List Items Addressed This Visit       Unprioritized   Acute CVA (cerebrovascular accident) ([place])    No new  symptoms  Pt is almost back to herself 100 %       Essential hypertension    Well controlled, no changes to meds. Encouraged heart healthy diet such as the DASH diet and exercise as tolerated.        Hyperlipidemia associated with type 2 diabetes mellitus ([place])    Encourage heart healthy diet such as MIND or DASH diet, increase exercise, avoid trans fats, simple carbohydrates and processed foods, consider a krill or fish or flaxseed oil cap daily.        Uncontrolled type 2 diabetes mellitus with hyperglycemia ([facility])    hgba1c acceptable, minimize simple carbs. Increase exercise as tolerated. Continue current meds         Other Visit Diagnoses     Anemia, unspecified type    -  Primary   Relevant Orders   CBC with Differential/Platelet   IBC + Ferritin       Follow-up: Return in about 6 months (around 08/11/2021) for annual exam, fasting.   I,Gordon Zheng,acting as a Education administrator for [employer], DO.,have documented all relevant documentation on the behalf of [name], DO,as directed by  [name], DO while in the presence of [name], DO.

## 2021-02-09 NOTE — Assessment & Plan Note (Signed)
Well controlled, no changes to meds. Encouraged heart healthy diet such as the DASH diet and exercise as tolerated.  °

## 2021-02-09 NOTE — Assessment & Plan Note (Signed)
Check labs today.

## 2021-02-09 NOTE — Assessment & Plan Note (Signed)
No new symptoms  Pt is almost back to herself 100 %

## 2021-02-20 ENCOUNTER — Other Ambulatory Visit: Payer: Self-pay | Admitting: Family Medicine

## 2021-02-20 ENCOUNTER — Inpatient Hospital Stay: Payer: Medicare HMO

## 2021-02-20 ENCOUNTER — Encounter: Payer: Self-pay | Admitting: Nurse Practitioner

## 2021-02-20 ENCOUNTER — Other Ambulatory Visit: Payer: Self-pay

## 2021-02-20 ENCOUNTER — Inpatient Hospital Stay: Payer: Medicare HMO | Attending: Nurse Practitioner | Admitting: Nurse Practitioner

## 2021-02-20 VITALS — BP 130/54 | HR 72 | Temp 97.7°F | Resp 18 | Ht 64.0 in | Wt 127.8 lb

## 2021-02-20 DIAGNOSIS — D471 Chronic myeloproliferative disease: Secondary | ICD-10-CM

## 2021-02-20 DIAGNOSIS — D45 Polycythemia vera: Secondary | ICD-10-CM | POA: Diagnosis not present

## 2021-02-20 DIAGNOSIS — Z8673 Personal history of transient ischemic attack (TIA), and cerebral infarction without residual deficits: Secondary | ICD-10-CM | POA: Diagnosis not present

## 2021-02-20 DIAGNOSIS — Z7902 Long term (current) use of antithrombotics/antiplatelets: Secondary | ICD-10-CM | POA: Insufficient documentation

## 2021-02-20 DIAGNOSIS — Z79899 Other long term (current) drug therapy: Secondary | ICD-10-CM | POA: Insufficient documentation

## 2021-02-20 DIAGNOSIS — D649 Anemia, unspecified: Secondary | ICD-10-CM | POA: Diagnosis not present

## 2021-02-20 DIAGNOSIS — Z7901 Long term (current) use of anticoagulants: Secondary | ICD-10-CM | POA: Diagnosis not present

## 2021-02-20 DIAGNOSIS — E119 Type 2 diabetes mellitus without complications: Secondary | ICD-10-CM | POA: Diagnosis not present

## 2021-02-20 DIAGNOSIS — I10 Essential (primary) hypertension: Secondary | ICD-10-CM | POA: Insufficient documentation

## 2021-02-20 LAB — CBC WITH DIFFERENTIAL (CANCER CENTER ONLY)
Abs Immature Granulocytes: 0.03 10*3/uL (ref 0.00–0.07)
Basophils Absolute: 0 10*3/uL (ref 0.0–0.1)
Basophils Relative: 1 %
Eosinophils Absolute: 0.1 10*3/uL (ref 0.0–0.5)
Eosinophils Relative: 2 %
HCT: 33.3 % — ABNORMAL LOW (ref 36.0–46.0)
Hemoglobin: 11.1 g/dL — ABNORMAL LOW (ref 12.0–15.0)
Immature Granulocytes: 1 %
Lymphocytes Relative: 24 %
Lymphs Abs: 1.5 10*3/uL (ref 0.7–4.0)
MCH: 36.6 pg — ABNORMAL HIGH (ref 26.0–34.0)
MCHC: 33.3 g/dL (ref 30.0–36.0)
MCV: 109.9 fL — ABNORMAL HIGH (ref 80.0–100.0)
Monocytes Absolute: 0.4 10*3/uL (ref 0.1–1.0)
Monocytes Relative: 7 %
Neutro Abs: 4 10*3/uL (ref 1.7–7.7)
Neutrophils Relative %: 65 %
Platelet Count: 349 10*3/uL (ref 150–400)
RBC: 3.03 MIL/uL — ABNORMAL LOW (ref 3.87–5.11)
RDW: 14.5 % (ref 11.5–15.5)
WBC Count: 6.1 10*3/uL (ref 4.0–10.5)
nRBC: 0 % (ref 0.0–0.2)

## 2021-02-20 NOTE — Progress Notes (Signed)
  Brandi Mccormick   Diagnosis: Polycythemia vera  INTERVAL HISTORY:   Brandi Mccormick returns as scheduled.  She continues hydroxyurea.  Hydroxyurea dose was reduced to 500 mg Monday through Thursday, off other days of the week, last month due to progressive anemia.  She feels well.  No bleeding or symptoms of thrombosis.  Good energy level.  No nausea or vomiting.  No diarrhea.  Objective:  Vital signs in last 24 hours:  Blood pressure (!) 130/54, pulse 72, temperature 97.7 F (36.5 C), temperature source Oral, resp. rate 18, height 5\' 4"  (1.626 m), weight 127 lb 12.8 oz (58 kg), SpO2 100 %.   Resp: Lungs clear bilaterally. Cardio: Regular rate and rhythm. GI: No hepatosplenomegaly. Vascular: No leg edema.   Lab Results:  Lab Results  Component Value Date   WBC 6.1 02/20/2021   HGB 11.1 (L) 02/20/2021   HCT 33.3 (L) 02/20/2021   MCV 109.9 (H) 02/20/2021   PLT 349 02/20/2021   NEUTROABS 4.0 02/20/2021    Imaging:  No results found.  Medications: I have reviewed the patient's current medications.  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  Review of the CBC shows hemoglobin and platelet count are in goal range.  Plan to continue hydroxyurea at the current dose of 500 mg Monday through Thursday, off other days of  the week.  She will return for a CBC in 6 weeks.  We will see her in follow-up in 3 months.  We are available to see her sooner if needed.  Plan reviewed with Dr. Benay Spice.    Ned Card ANP/GNP-BC   02/20/2021  10:41 AM

## 2021-02-27 ENCOUNTER — Other Ambulatory Visit (HOSPITAL_COMMUNITY): Payer: Self-pay | Admitting: Interventional Radiology

## 2021-02-27 ENCOUNTER — Telehealth (HOSPITAL_COMMUNITY): Payer: Self-pay

## 2021-02-27 DIAGNOSIS — I639 Cerebral infarction, unspecified: Secondary | ICD-10-CM

## 2021-02-27 NOTE — Telephone Encounter (Signed)
Called to schedule cta head/neck. Pt was not home at the time, will try back later. AW

## 2021-03-03 ENCOUNTER — Other Ambulatory Visit: Payer: Self-pay | Admitting: Internal Medicine

## 2021-03-07 ENCOUNTER — Other Ambulatory Visit: Payer: Self-pay

## 2021-03-07 ENCOUNTER — Ambulatory Visit (INDEPENDENT_AMBULATORY_CARE_PROVIDER_SITE_OTHER): Payer: Medicare HMO | Admitting: Internal Medicine

## 2021-03-07 ENCOUNTER — Encounter: Payer: Self-pay | Admitting: Internal Medicine

## 2021-03-07 ENCOUNTER — Telehealth: Payer: Self-pay | Admitting: Internal Medicine

## 2021-03-07 VITALS — BP 122/60 | HR 75 | Ht 64.0 in | Wt 128.0 lb

## 2021-03-07 DIAGNOSIS — E785 Hyperlipidemia, unspecified: Secondary | ICD-10-CM | POA: Diagnosis not present

## 2021-03-07 DIAGNOSIS — E1169 Type 2 diabetes mellitus with other specified complication: Secondary | ICD-10-CM | POA: Diagnosis not present

## 2021-03-07 DIAGNOSIS — E1165 Type 2 diabetes mellitus with hyperglycemia: Secondary | ICD-10-CM

## 2021-03-07 DIAGNOSIS — Z8639 Personal history of other endocrine, nutritional and metabolic disease: Secondary | ICD-10-CM | POA: Diagnosis not present

## 2021-03-07 LAB — POCT GLYCOSYLATED HEMOGLOBIN (HGB A1C): Hemoglobin A1C: 6.9 % — AB (ref 4.0–5.6)

## 2021-03-07 MED ORDER — ONETOUCH VERIO VI STRP: ORAL_STRIP | 3 refills | Status: DC

## 2021-03-07 NOTE — Progress Notes (Signed)
Patient ID: Brandi Mccormick, female   DOB: 07-09-1941, 80 y.o.   MRN: [id number]  This visit occurred during the SARS-CoV-2 public health emergency.  Safety protocols were in place, including screening questions prior to the visit, additional usage of staff PPE, and extensive cleaning of exam room while observing appropriate contact time as indicated for disinfecting solutions.   HPI: Brandi Mccormick is a 80 y.o.-year-old female, presenting for f/u for DM2, dx 2013, non-insulin-dependent, uncontrolled, with complications (CVA 78/9381, microalbuminuria) and h/o hyperthyroidism. Last visit 4 mo ago.  Interim history: She had a CVA on 10/06/2020.  At last visit, she recovered, but she was preparing to start physical therapy.  No low CBGs after she started. She continues PT. No increased urination, blurry vision, nausea, chest pain.  DM2: Reviewed HbA1c levels: 11/02/2020: HbA1c calculated from fructosamine: 10%, even higher than the directly measured HbA1c... Lab Results  Component Value Date   HGBA1C 8.0 (H) 10/07/2020   HGBA1C 7.3 (A) 06/29/2020   HGBA1C 7.2 (A) 02/24/2020   HGBA1C 7.2 (H) 11/04/2019   HGBA1C 6.6 (H) 05/06/2019   HGBA1C 6.1 12/21/2018   HGBA1C 8.3 (H) 09/11/2018   HGBA1C 6.9 (A) 06/04/2018   HGBA1C 7.6 (A) 02/02/2018   HGBA1C 7.4 10/03/2017   HGBA1C 7.0 (H) 04/18/2017   HGBA1C 6.9 02/14/2017   HGBA1C 7.3 11/15/2016   HGBA1C 6.6 06/28/2016   HGBA1C 7.1 03/28/2016   Pt is on a regimen of: - Glipizide XL 5 mg in a.m.  >> 10 (2x 5 mg) mg in a.m.  We tried Tradjenta 5 mg daily in am - too expensive. She was previously on Janumet XR and Metformin ER, but this was stopped 05/2019 due to declining kidney function.  Pt checks her sugars once a day per review of her detailed log: - am:  85-152 >> 72-129, 135 >> 92-130, 141 >> 88, 90-137, 140 - 2h after b'fast: 132 >> n/c >> 127-155 >> 124 >> 125-152 >> 138-144 - before lunch: 101, 205 >> 123-148 >> 100-152 >> 129 >>  115-138 - 2h after lunch:  133-153 >> 128-152 >>  131-135 >> 138, 148 - before dinner: 88, 117-140 >> 89, 140-145 >> 97-133 >> 113, 138 - 2h after dinner: 141, 152 >> 114-152 >> 138, 146 >> 142, 150 - bedtime:  69, 149 >> 137-169 >> 153 >> 145 >> n/c >> 99-143 - nighttime: n/c >> 161 >> n/c Lowest sugar: 72 >> 92 >> 88; it is unclear at which level she has hypoglycemia awareness. Highest sugar: 152 >> 152 >> 150  Meter: OneTouch Verio.  Pt's meals are: - Breakfast: egg, coffee, juice - Lunch: No Lunch - Dinner: meat + vegetables >> late dinner during the weekend as her son is cooking and brings her food - Snacks: 1-2 Some regular sodas, rarely apple juice.  -+ CKD (FSGS-sees nephrology), last BUN/creatinine:  Lab Results  Component Value Date   BUN 27 (H) 12/07/2020   CREATININE 2.65 (H) 12/07/2020   Lab Results  Component Value Date   GFRAA 24 (L) 06/10/2019   GFRAA 27 (L) 04/28/2019   GFRAA 43 (L) 07/27/2018   GFRAA >60 06/26/2018   GFRAA 38 (L) 06/02/2018   GFRAA 82 06/28/2016   GFRAA >90 06/25/2014   GFRAA >90 06/24/2014   GFRAA >90 10/23/2013  12/21/2018: GFR 28.9   She has MAU: Lab Results  Component Value Date   MICRALBCREAT 43.8 (H) 11/04/2019   MICRALBCREAT 77.6 (H) 05/06/2019   MICRALBCREAT  422.7 (H) 04/02/2018   MICRALBCREAT 225.8 (H) 11/15/2016   MICRALBCREAT 48.8 (H) 06/28/2016   MICRALBCREAT 9.9 02/21/2014   MICRALBCREAT 8.8 11/16/2013  On lisinopril 40.  -+ CKD; last set of lipids: Lab Results  Component Value Date   CHOL 163 12/07/2020   HDL 56.70 12/07/2020   LDLCALC 88 12/07/2020   TRIG 94.0 12/07/2020   CHOLHDL 3 12/07/2020  Prev. on Simvastatin >> off in 06/2016 b/c fatigue.  She started pravastatin 80 mg daily in 12/2016 >> currently on Crestor 40.   - no numbness and tingling in her feet.  + Onychomycosis  - Last eye exam in 09/2019: No DR reportedly; Had OS cataract sx.   H/o hyperthyroidism - previously treated with medication,  more than 20 years ago.  No other history available.  Pt denies: - feeling nodules in neck - hoarseness - dysphagia - choking - SOB with lying down  Her TFTs were normal: Lab Results  Component Value Date   TSH 0.37 11/02/2020   TSH 1.12 05/12/2019   TSH 0.635 06/26/2018   TSH 1.11 04/02/2018   TSH 1.13 07/03/2017   TSH 1.12 06/28/2016   TSH 1.29 06/23/2015   TSH 1.44 10/18/2014   TSH 0.93 08/15/2014   TSH 0.44 02/17/2014   FREET4 0.85 05/12/2019   FREET4 0.93 07/03/2017   FREET4 0.96 06/28/2016   FREET4 0.91 08/15/2014   FREET4 1.00 02/17/2014   FREET4 0.97 12/20/2013   T3FREE 2.7 05/12/2019   T3FREE 3.2 07/03/2017   T3FREE 3.1 06/28/2016   T3FREE 3.2 08/15/2014   T3FREE 2.6 02/17/2014   T3FREE 2.9 12/20/2013   ROS: Constitutional: no weight gain/no weight loss, no fatigue, no subjective hyperthermia, no subjective hypothermia Eyes: no blurry vision, no xerophthalmia ENT: no sore throat, + see HPI Cardiovascular: no CP/no SOB/no palpitations/no leg swelling Respiratory: no cough/no SOB/no wheezing Gastrointestinal: no N/no V/no D/no C/no acid reflux Musculoskeletal: no muscle aches/no joint aches Skin: no rashes, no hair loss Neurological: no tremors/no numbness/no tingling/no dizziness  I reviewed pt's medications, allergies, PMH, social hx, family hx, and changes were documented in the history of present illness. Otherwise, unchanged from my initial visit note.  Past Medical History:  Diagnosis Date   CVA (cerebral vascular accident) ([place]) 10/2020   Diabetes ([place])    GERD (gastroesophageal reflux disease)    Hyperlipidemia    Hypertension    Hyperthyroidism    Pneumonia    Shingles    Past Surgical History:  Procedure Laterality Date   CATARACT EXTRACTION Left 2020   COLONOSCOPY     DENTAL SURGERY     IR ANGIO INTRA EXTRACRAN SEL COM CAROTID INNOMINATE BILAT MOD SED  10/09/2020   IR ANGIO VERTEBRAL SEL VERTEBRAL BILAT MOD SED  10/09/2020   Social  History   Socioeconomic History   Marital status: Married    Spouse name: Not on file   Number of children: Not on file   Years of education: Not on file   Highest education level: Not on file  Occupational History   Occupation: [employer]--    Comment: retired  Tobacco Use   Smoking status: Never   Smokeless tobacco: Never  Vaping Use   Vaping Use: Never used  Substance and Sexual Activity   Alcohol use: Yes    Alcohol/week: 0.0 standard drinks    Comment: occasional wine cooler   Drug use: No   Sexual activity: Never    Partners: Male  Other Topics Concern   Not  on file  Social History Narrative   Exercise-- yard work,  Walking in [place] Strain: Low Risk    Difficulty of Paying Living Expenses: Not hard at all  Food Insecurity: No Food Insecurity   Worried About Charity fundraiser in the Last Year: Never true   Arboriculturist in the Last Year: Never true  Transportation Needs: No Transportation Needs   Lack of Transportation (Medical): No   Lack of Transportation (Non-Medical): No  Physical Activity: Not on file  Stress: Not on file  Social Connections: Not on file  Intimate Partner Violence: Not on file   Current Outpatient Medications on File Prior to Visit  Medication Sig Dispense Refill   amLODipine (NORVASC) 10 MG tablet Take 1 tablet by mouth once daily 90 tablet 0   aspirin EC 325 MG EC tablet Take 1 tablet (325 mg total) by mouth daily. 30 tablet 2   clopidogrel (PLAVIX) 75 MG tablet Take 1 tablet (75 mg total) by mouth daily. 30 tablet 3   famotidine (PEPCID) 20 MG tablet Take 20 mg by mouth daily.     glipiZIDE (GLUCOTROL XL) 5 MG 24 hr tablet Take 2 tablets (10 mg total) by mouth daily. 180 tablet 3   hydroxyurea (HYDREA) 500 MG capsule Take 1 capsule by mouth once daily 90 capsule 0   Lancets (ONETOUCH DELICA PLUS [id number]) MISC USE 1  TO CHECK GLUCOSE TWICE DAILY 100 each 3   lisinopril (ZESTRIL) 40  MG tablet Take 1 tablet by mouth once daily 90 tablet 0   metoprolol succinate (TOPROL-XL) 25 MG 24 hr tablet Take 1 tablet by mouth once daily 90 tablet 1   ONETOUCH VERIO test strip USE 1 STRIP TO CHECK GLUCOSE ONCE DAILY 50 each 0   pantoprazole (PROTONIX) 40 MG tablet Take 1 tablet by mouth once daily 90 tablet 1   rosuvastatin (CRESTOR) 40 MG tablet Take 1 tablet (40 mg total) by mouth daily. 90 tablet 0   No current facility-administered medications on file prior to visit.   Allergies  Allergen Reactions   Penicillins Nausea And Vomiting    Has patient had a PCN reaction causing immediate rash, facial/tongue/throat swelling, SOB or lightheadedness with hypotension: no Has patient had a PCN reaction causing severe rash involving mucus membranes or skin necrosis: no Has patient had a PCN reaction that required hospitalization: no Has patient had a PCN reaction occurring within the last 10 years: no If all of the above answers are "NO", then may proceed with Cephalosporin use.    Family History  Problem Relation Age of Onset   Diabetes Sister    Diabetes Brother    Diabetes Sister    Diabetes Mother        and on mother's side of family   Colon cancer Mother    Stroke Mother    Diabetes Father        and on father's side of family   Heart disease Neg Hx    Rectal cancer Neg Hx    Stomach cancer Neg Hx    PE: BP 122/60 (BP Location: Left Arm, Patient Position: Sitting, Cuff Size: Normal)   Pulse 75   Ht 5\' 4"  (1.626 m)   Wt 128 lb (58.1 kg)   SpO2 99%   BMI 21.97 kg/m  Body mass index is 21.97 kg/m. Wt Readings from Last 3 Encounters:  03/07/21 128 lb (58.1 kg)  02/20/21 127 lb 12.8 oz (58 kg)  02/09/21 127 lb 9.6 oz (57.9 kg)   Constitutional:  Normal weight, in NAD Eyes: PERRLA, EOMI, no exophthalmos ENT: moist mucous membranes, no thyromegaly, no cervical lymphadenopathy Cardiovascular: RRR, No MRG Respiratory: CTA B Gastrointestinal: abdomen soft, NT, ND,  BS+ Musculoskeletal: no deformities, strength intact in all 4 Skin: moist, warm, no rashes Neurological: no tremor with outstretched hands, DTR normal in all 4  ASSESSMENT: 1. DM2, non-insulin-dependent, uncontrolled, with complications - CVA 16/1096 - MAU  2. H/o Hyperthyroidism - 20 years ago - was on meds, cannot remember name  3. HL  PLAN:  1. Patient with history of uncontrolled diabetes with improved control in the last few years, on sulfonylurea only.  We had to stop metformin ER in the past due to declining kidney function in the setting of her FSGS.  She sees nephrology (Dr. Johnney Ou) and did not have to start prednisone for this.  In the past, she was drinking regular sodas and sweet tea and I strongly advised her to stop.  HbA1c before last visit was higher, at 8.0%.  She had a CVA in 10/2020 but afterwards, she started to improve her diet and sugars started to improve, with few mild hyperglycemic exceptions at last visit.  At last visit, HbA1c calculated from fructosamine was much higher, though, at 10%, however, this did not correlate with the blood sugars at home.  I advised her to bring her meter at today's visit to be able to verify it with our own.  I did advise her at that time that if she started physical therapy she may need to only 50% of her glipizide dose that day.   -At today's visit, per review of her blood sugar log, she did not have any low blood sugars after she started physical therapy.  In fact, her sugars are quite well controlled, with very few mild hyperglycemic exceptions before meals.  Sugars in the morning are excellent.  For now, we can continue with her current regimen but we again discussed about the importance of cutting out sweet drinks completely. - I suggested to:  Patient Instructions  Please continue: - Glipizide XL 10 mg before breakfast  NO SWEET TEA AND REGULAR SODAS!  Please return in 4 months with your sugar log.   - we checked her HbA1c:  6.9% (lower) - advised to check sugars at different times of the day - 1x a day, rotating check times - advised to schedule her yearly eye exams >> she is not UTD - return to clinic in 4 months  2. H/o hyperthyroidism -No thyrotoxic complaints -Latest TSH was normal: Lab Results  Component Value Date   TSH 0.37 11/02/2020   3. HL -Reviewed lipid panel from 12/2020: LDL slightly higher than our goal of less than 70, the rest of the fractions are at goal: Lab Results  Component Value Date   CHOL 163 12/07/2020   HDL 56.70 12/07/2020   LDLCALC 88 12/07/2020   TRIG 94.0 12/07/2020   CHOLHDL 3 12/07/2020  -Continues Crestor 40 mg daily without side effects  Philemon Kingdom, MD PhD [facility] Endocrinology

## 2021-03-07 NOTE — Telephone Encounter (Signed)
Patient called re: Patient went to pick up her RX for glucose blood (ONETOUCH VERIO) test strip from  [facility] ([place]), [place] Phone:  [phone]  Fax:  [phone]    However, the above listed PHARM told Patient the RX for the above test strips did not go through due to there was no code on the RX.

## 2021-03-07 NOTE — Patient Instructions (Addendum)
Please continue: - Glipizide XL 10 mg before breakfast.  NO SWEET TEA AND REGULAR SODAS!  Please return in 4 months with your sugar log.

## 2021-03-13 ENCOUNTER — Other Ambulatory Visit: Payer: Self-pay

## 2021-03-13 DIAGNOSIS — E1165 Type 2 diabetes mellitus with hyperglycemia: Secondary | ICD-10-CM

## 2021-03-13 MED ORDER — ONETOUCH VERIO VI STRP: ORAL_STRIP | 3 refills | Status: DC

## 2021-03-15 ENCOUNTER — Ambulatory Visit: Payer: Medicare HMO | Admitting: Adult Health

## 2021-03-15 ENCOUNTER — Encounter: Payer: Self-pay | Admitting: Adult Health

## 2021-03-15 VITALS — BP 126/76 | HR 72 | Ht 64.0 in | Wt 128.6 lb

## 2021-03-15 DIAGNOSIS — I10 Essential (primary) hypertension: Secondary | ICD-10-CM

## 2021-03-15 DIAGNOSIS — E785 Hyperlipidemia, unspecified: Secondary | ICD-10-CM | POA: Diagnosis not present

## 2021-03-15 DIAGNOSIS — I6302 Cerebral infarction due to thrombosis of basilar artery: Secondary | ICD-10-CM

## 2021-03-15 DIAGNOSIS — E119 Type 2 diabetes mellitus without complications: Secondary | ICD-10-CM | POA: Diagnosis not present

## 2021-03-15 MED ORDER — CLOPIDOGREL BISULFATE 75 MG PO TABS: 75.0000 mg | ORAL_TABLET | Freq: Every day | ORAL | 1 refills | Status: DC

## 2021-03-15 NOTE — Progress Notes (Signed)
I agree with the above plan 

## 2021-03-15 NOTE — Patient Instructions (Signed)
Continue clopidogrel 75 mg daily  and Crestor  for secondary stroke prevention  Continue to follow up with PCP regarding cholesterol, blood pressure and diabetes management  Maintain strict control of hypertension with blood pressure goal below 130/90, diabetes with hemoglobin A1c goal below 7% and cholesterol with LDL cholesterol (bad cholesterol) goal below 70 mg/dL.       Followup in the future with me in 6 months or call earlier if needed       Thank you for coming to see [place] at [facility]. I hope we have been able to provide you high quality care today.  You may receive a patient satisfaction survey over the next few weeks. We would appreciate your feedback and comments so that we may continue to improve ourselves and the health of our patients.

## 2021-03-15 NOTE — Progress Notes (Signed)
[facility] [address] [place]. [place] [address] [phone]       STROKE FOLLOW UP NOTE  [name] Date of Birth:  06-Feb-1941 Medical Record Number:  [id number]   Reason for Referral: stroke follow up    SUBJECTIVE:   CHIEF COMPLAINT:  Chief Complaint  Patient presents with   Cerebral infarction    Rm 3, 4 month [place] well, no new concerns"     HPI:   Today, 03/15/2021, [name] returns for stroke follow-up unaccompanied.  He has been doing well since prior visit without new or reoccurring stroke/TIAs symptoms.  Compliant on Plavix, aspirin and Crestor without associated side effects. Blood pressure today 126/76.  Glucose levels stable recent A1c 6.9 (down from 8.0).  No new concerns at this time.   History provided for reference purposes only Initial visit 11/07/2020 JM: [name] is being seen for hospital follow-up unaccompanied.  Doing well since discharge with complete resolution of stroke deficits He has returned back to all prior activities without difficulty Denies new or recurring stroke/TIA symptoms  Reports compliance on aspirin 325 mg daily and Plavix -denies bleeding or bruising Compliant on Crestor 40 mg daily -denies side effects Blood pressure today 140/72 -monitors at home and typically 130-140s Glucose levels have been stable per patient report  No concerns at this time  Stroke admission 10/06/2020 [name] is a 80 y.o. female with history of DM, GERD, HLD, HTN, hyperthyroidism who presented on 10/06/2020 with balance issues and unsteady feeling for 3 days.  Personally reviewed hospitalization pertinent progress notes, lab work and imaging with summary provided.  Evaluated by Dr. 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  Diagnostic cerebral angiogram by Dr. Estanislado Pandy and recommended follow-up in 3 months for repeat CTA  head/neck as well as long-term BP goal 130-150 to ensure adequate perfusion.  Placed on DAPT for 3 months then Plavix alone.  LDL 99.  A1c 8.0.  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  No prior stroke history.  Other active problems include vascularized lymph node incidentally found on carotid ultrasound and is asymptomatic, recommended follow-up outpatient.  Evaluated by therapies recommended outpatient PT and discharged home in stable condition.  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  MRI  Small acute bilateral cerebellar and right pontine infarcts superimposed on multiple chronic posterior circulation infarcts.   MRA - Proximal occlusion of the basilar artery with distal Reconstitution. Poor visualization of the left PCA beginning at the mid P2 level which may reflect occlusion or severely diminished flow. Heavily diseased distal right vertebral artery. 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  Carotid Doppler unremarkable.  Incidental finding of enlarged vascularized lymph node adjacent to right CCA 2D Echo EF 65-70% LDL 99 HgbA1c 8.0 UDS negative VTE prophylaxis - Heparin subcu aspirin 81 mg daily prior to admission, now on aspirin 325 mg daily and clopidogrel 75 mg daily DAPT. Recommend Asprin 325 mg and Plavix 75 mg for 3 months and then Plavix 75 mg alone. Therapy recommendations: Outpatient PT.  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  Right VA  high-grade stenosis versus hypoplastic Likely the cause of patient posterior circulation infarcts S/p diagnostic Cerebral angiogram by Dr. Estanislado Pandy. Occluded b/l  VA proximally with distal recon at C2 levels. BA occlusion distal to AICAs. Right PCOM filling both PCAs. SCAs filling from right ICA. Dr. Estanislado Pandy recommended follow-up in 3 months with CTA head and neck.  Avoid low BP  BP goal long term 130-150         ROS:   14 system review of systems performed and negative with exception of no complaints  PMH:  Past Medical History:  Diagnosis Date   CVA (cerebral vascular accident) ([place]) 10/2020   Diabetes ([place])    GERD (gastroesophageal reflux disease)    Hyperlipidemia    Hypertension    Hyperthyroidism    Pneumonia    Shingles     PSH:  Past Surgical History:  Procedure Laterality Date   CATARACT EXTRACTION Left 2020   COLONOSCOPY     DENTAL SURGERY     IR ANGIO INTRA EXTRACRAN SEL COM CAROTID INNOMINATE BILAT MOD SED  10/09/2020   IR ANGIO VERTEBRAL SEL VERTEBRAL BILAT MOD SED  10/09/2020    Social History:  Social History   Socioeconomic History   Marital status: Married    Spouse name: Not on file   Number of children: Not on file   Years of education: Not on file   Highest education level: Not on file  Occupational History   Occupation: [employer]--    Comment: retired  Tobacco Use   Smoking status: Never   Smokeless tobacco: Never  Vaping Use   Vaping Use: Never used  Substance and Sexual Activity   Alcohol use: Yes    Alcohol/week: 0.0 standard drinks    Comment: occasional wine cooler   Drug use: No   Sexual activity: Never    Partners: Male  Other Topics Concern   Not on file  Social History Narrative   Exercise-- yard work,  Walking in [employer]   03/15/21 lives with husband   Social Determinants of Health   Financial Resource Strain: Low Risk    Difficulty of Paying Living Expenses: Not hard at all  Food Insecurity: No Food Insecurity   Worried About Charity fundraiser in the Last Year: Never true   Arboriculturist in the Last Year: Never true  Transportation Needs: No Transportation Needs   Lack of  Transportation (Medical): No   Lack of Transportation (Non-Medical): No  Physical Activity: Not on file  Stress: Not on file  Social Connections: Not on file  Intimate Partner Violence: Not on file    Family History:  Family History  Problem Relation Age of Onset   Diabetes Mother        and on mother's side of family   Colon cancer Mother    Stroke Mother    Diabetes Father        and on father's side of family   Diabetes Sister    Diabetes Sister    Diabetes Brother    Heart disease Neg Hx    Rectal cancer Neg Hx    Stomach cancer Neg Hx     Medications:   Current Outpatient Medications on File Prior to Visit  Medication Sig Dispense Refill   amLODipine (NORVASC) 10 MG tablet Take 1 tablet by mouth once daily 90 tablet 0   aspirin EC 325 MG EC tablet Take 1 tablet (325 mg total) by mouth daily. 30 tablet 2  famotidine (PEPCID) 20 MG tablet Take 20 mg by mouth daily.     glipiZIDE (GLUCOTROL XL) 5 MG 24 hr tablet Take 2 tablets (10 mg total) by mouth daily. 180 tablet 3   glucose blood (ONETOUCH VERIO) test strip USE 1 STRIP TO CHECK GLUCOSE 2x DAILY 200 each 3   hydroxyurea (HYDREA) 500 MG capsule Take 1 capsule by mouth once daily 90 capsule 0   Lancets (ONETOUCH DELICA PLUS [id number]) MISC USE 1  TO CHECK GLUCOSE TWICE DAILY 100 each 3   lisinopril (ZESTRIL) 40 MG tablet Take 1 tablet by mouth once daily 90 tablet 0   metoprolol succinate (TOPROL-XL) 25 MG 24 hr tablet Take 1 tablet by mouth once daily 90 tablet 1   pantoprazole (PROTONIX) 40 MG tablet Take 1 tablet by mouth once daily 90 tablet 1   rosuvastatin (CRESTOR) 40 MG tablet Take 1 tablet (40 mg total) by mouth daily. 90 tablet 0   clopidogrel (PLAVIX) 75 MG tablet Take 1 tablet (75 mg total) by mouth daily. (Patient not taking: Reported on 03/15/2021) 30 tablet 3   No current facility-administered medications on file prior to visit.    Allergies:   Allergies  Allergen Reactions   Penicillins Nausea And  Vomiting    Has patient had a PCN reaction causing immediate rash, facial/tongue/throat swelling, SOB or lightheadedness with hypotension: no Has patient had a PCN reaction causing severe rash involving mucus membranes or skin necrosis: no Has patient had a PCN reaction that required hospitalization: no Has patient had a PCN reaction occurring within the last 10 years: no If all of the above answers are "NO", then may proceed with Cephalosporin use.       OBJECTIVE:  Physical Exam  Vitals:   03/15/21 1516  BP: 126/76  Pulse: 72  Weight: 128 lb 9.6 oz (58.3 kg)  Height: 5\' 4"  (1.626 m)   Body mass index is 22.07 kg/m. No results found.  General: well developed, well nourished,  very pleasant elderly African-American female, seated, in no evident distress Head: head normocephalic and atraumatic.   Neck: supple with no carotid or supraclavicular bruits Cardiovascular: regular rate and rhythm, no murmurs Musculoskeletal: no deformity Skin:  no rash/petichiae Vascular:  Normal pulses all extremities   Neurologic Exam Mental Status: Awake and fully alert.   Fluent speech and language.  Oriented to place and time. Recent and remote memory intact. Attention span, concentration and fund of knowledge appropriate. Mood and affect appropriate.  Cranial Nerves: Pupils equal, briskly reactive to light. Extraocular movements full without nystagmus. Visual fields full to confrontation. Hearing intact. Facial sensation intact. Face, tongue, palate moves normally and symmetrically.  Motor: Normal bulk and tone. Normal strength in all tested extremity muscles Sensory.: intact to touch , pinprick , position and vibratory sensation.  Coordination: Rapid alternating movements normal in all extremities. Finger-to-nose and heel-to-shin performed accurately bilaterally. Gait and Station: Arises from chair without difficulty. Stance is normal. Gait demonstrates normal stride length and balance with  without use of assistive device. Tandem walk and heel toe without difficulty.  Romberg negative Reflexes: 1+ and symmetric. Toes downgoing.        ASSESSMENT: Brandi Mccormick is a 80 y.o. 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 Vascular risk factors include HTN, HLD, DM, basilar artery occlusion, polycythemia vera rubra and advanced age.  PLAN:  B cerebellar and R pontine stroke:  Recovered well without residual deficit.   Continue clopidogrel 75 mg daily  and Crestor 40 mg daily for secondary stroke prevention. Advised to discontinue aspirin as 3 months DAPT completed  Discussed secondary stroke prevention measures and importance of close PCP follow up for aggressive stroke risk factor management  HTN: BP goal <130/90.  Stable on current regimen per PCP HLD: LDL goal <70. Recent LDL 88 down from 99 on Crestor 40 mg daily.  DMII: A1c goal<7.0. Recent A1c 6.9 down from 8.0 monitored by endocrinology.     Follow up in 6 months or call earlier if needed   CC:  [facility] provider: Dr. Leonie Man PCP: Carollee Herter, Alferd Apa, DO    I spent 35 minutes of face-to-face and non-face-to-face time with patient.  This included previsit chart review, lab review, study review, order entry, electronic health record documentation, patient education and discussion regarding prior stroke and etiology as well as secondary stroke prevention and aggressive stroke risk factor management and answered all other questions to patient satisfaction  Frann Rider, [facility] [address] [place], [place] [address]  Phone [phone] Fax [phone] Note: This document was prepared with digital dictation and possible smart phrase technology. Any transcriptional errors that result from this process are  unintentional.

## 2021-03-16 ENCOUNTER — Ambulatory Visit: Payer: Medicare HMO | Admitting: Podiatry

## 2021-03-16 ENCOUNTER — Other Ambulatory Visit: Payer: Self-pay

## 2021-03-16 DIAGNOSIS — E119 Type 2 diabetes mellitus without complications: Secondary | ICD-10-CM

## 2021-03-16 DIAGNOSIS — M79675 Pain in left toe(s): Secondary | ICD-10-CM | POA: Diagnosis not present

## 2021-03-16 DIAGNOSIS — M79674 Pain in right toe(s): Secondary | ICD-10-CM

## 2021-03-16 DIAGNOSIS — B351 Tinea unguium: Secondary | ICD-10-CM | POA: Diagnosis not present

## 2021-03-18 ENCOUNTER — Encounter: Payer: Self-pay | Admitting: Podiatry

## 2021-03-18 NOTE — Progress Notes (Signed)
Subjective: Brandi Mccormick is a pleasant 80 y.o. female patient seen today painful thick toenails that are difficult to trim. Pain interferes with ambulation. Aggravating factors include wearing enclosed shoe gear. Pain is relieved with periodic professional debridement.  PCP is Carollee Herter, [place] R, DO. Last visit was: 02/09/2021.  She states her toenails are long today. She voices no new pedal problems on today's visit.  Allergies  Allergen Reactions   Penicillins Nausea And Vomiting    Has patient had a PCN reaction causing immediate rash, facial/tongue/throat swelling, SOB or lightheadedness with hypotension: no Has patient had a PCN reaction causing severe rash involving mucus membranes or skin necrosis: no Has patient had a PCN reaction that required hospitalization: no Has patient had a PCN reaction occurring within the last 10 years: no If all of the above answers are "NO", then may proceed with Cephalosporin use.     Objective: Physical Exam  General: Brandi Mccormick is a pleasant 80 y.o. African American female, WD, WN in NAD. AAO x 3.   Vascular:  Capillary refill time to digits immediate b/l. Palpable DP pulse(s) b/l lower extremities Palpable PT pulse(s) b/l lower extremities Pedal hair sparse. Lower extremity skin temperature gradient within normal limits. No pain with calf compression b/l. No edema noted b/l lower extremities.  Dermatological:  Pedal skin with normal turgor, texture and tone b/l lower extremities. No open wounds b/l lower extremities. No interdigital macerations b/l lower extremities. Toenails 1-5 b/l elongated, discolored, dystrophic, thickened, crumbly with subungual debris and tenderness to dorsal palpation.  Musculoskeletal:  Normal muscle strength 5/5 to all lower extremity muscle groups bilaterally. No pain crepitus or joint limitation noted with ROM b/l. No gross bony deformities bilaterally. Patient ambulates independent of any assistive  aids.  Neurological:  Protective sensation intact 5/5 intact bilaterally with 10g monofilament b/l. Vibratory sensation intact b/l.  Assessment and Plan:  1. Pain due to onychomycosis of toenails of both feet   2. Controlled type 2 diabetes mellitus without complication, without long-term current use of insulin ([place])    -Continue diabetic foot care principles. -Patient to continue soft, supportive shoe gear daily. -Toenails 1-5 b/l were debrided in length and girth with sterile nail nippers and dremel without iatrogenic bleeding.  -Patient to report any pedal injuries to medical professional immediately. -Patient/POA to call should there be question/concern in the interim.  Return in about 3 months (around 06/16/2021).  Marzetta Board, DPM

## 2021-04-03 ENCOUNTER — Inpatient Hospital Stay: Payer: Medicare HMO | Attending: Nurse Practitioner

## 2021-04-03 ENCOUNTER — Other Ambulatory Visit: Payer: Self-pay

## 2021-04-03 DIAGNOSIS — D471 Chronic myeloproliferative disease: Secondary | ICD-10-CM

## 2021-04-03 DIAGNOSIS — D45 Polycythemia vera: Secondary | ICD-10-CM | POA: Diagnosis not present

## 2021-04-03 LAB — CBC WITH DIFFERENTIAL (CANCER CENTER ONLY)
Abs Immature Granulocytes: 0.02 K/uL (ref 0.00–0.07)
Basophils Absolute: 0 K/uL (ref 0.0–0.1)
Basophils Relative: 1 %
Eosinophils Absolute: 0.1 K/uL (ref 0.0–0.5)
Eosinophils Relative: 2 %
HCT: 34.5 % — ABNORMAL LOW (ref 36.0–46.0)
Hemoglobin: 11.4 g/dL — ABNORMAL LOW (ref 12.0–15.0)
Immature Granulocytes: 0 %
Lymphocytes Relative: 26 %
Lymphs Abs: 1.4 K/uL (ref 0.7–4.0)
MCH: 35 pg — ABNORMAL HIGH (ref 26.0–34.0)
MCHC: 33 g/dL (ref 30.0–36.0)
MCV: 105.8 fL — ABNORMAL HIGH (ref 80.0–100.0)
Monocytes Absolute: 0.5 K/uL (ref 0.1–1.0)
Monocytes Relative: 9 %
Neutro Abs: 3.3 K/uL (ref 1.7–7.7)
Neutrophils Relative %: 62 %
Platelet Count: 414 K/uL — ABNORMAL HIGH (ref 150–400)
RBC: 3.26 MIL/uL — ABNORMAL LOW (ref 3.87–5.11)
RDW: 13.5 % (ref 11.5–15.5)
WBC Count: 5.4 K/uL (ref 4.0–10.5)
nRBC: 0 % (ref 0.0–0.2)

## 2021-04-04 ENCOUNTER — Telehealth: Payer: Self-pay

## 2021-04-04 NOTE — Telephone Encounter (Signed)
Called spoke with pt made her aware per provider to continue Hydrea at same dose

## 2021-04-04 NOTE — Telephone Encounter (Signed)
-----   Message from Owens Shark, NP sent at 04/03/2021  4:32 PM EDT ----- Please instruct her to continue Hydrea at the current dose.  Follow-up as scheduled.

## 2021-04-10 ENCOUNTER — Other Ambulatory Visit: Payer: Self-pay

## 2021-04-10 ENCOUNTER — Other Ambulatory Visit: Payer: Self-pay | Admitting: Family Medicine

## 2021-04-10 ENCOUNTER — Ambulatory Visit (INDEPENDENT_AMBULATORY_CARE_PROVIDER_SITE_OTHER): Payer: Medicare HMO

## 2021-04-10 VITALS — BP 102/68 | HR 65 | Temp 97.7°F | Resp 16 | Ht 64.0 in | Wt 127.4 lb

## 2021-04-10 DIAGNOSIS — Z0000 Encounter for general adult medical examination without abnormal findings: Secondary | ICD-10-CM

## 2021-04-10 DIAGNOSIS — I10 Essential (primary) hypertension: Secondary | ICD-10-CM

## 2021-04-10 NOTE — Patient Instructions (Signed)
Brandi Mccormick , Thank you for taking time to come for your Medicare Wellness Visit. I appreciate your ongoing commitment to your health goals. Please review the following plan we discussed and let me know if I can assist you in the future.   Screening recommendations/referrals: Colonoscopy: No longer required Mammogram: Declined. Please call the office to schedule if you chabge your mind. Bone Density: Declined. Please call the office to schedule if you chabge your mind. Recommended yearly ophthalmology/optometry visit for glaucoma screening and checkup Recommended yearly dental visit for hygiene and checkup  Vaccinations: Influenza vaccine: Due 05/2021 Pneumococcal vaccine: Due for Prevnar-13-Discuss with PCP at next office visit Tdap vaccine: Up to date-Due 06/22/2025 Shingles vaccine: Discuss with pharmacy  Advanced directives: Declined information today  Conditions/risks identified: See problem list  Next appointment: Follow up in one year for your annual wellness visit 04/16/2022 @ 10:20   Preventive Care 18 Years and Older, Female Preventive care refers to lifestyle choices and visits with your health care provider that can promote health and wellness. What does preventive care include? A yearly physical exam. This is also called an annual well check. Dental exams once or twice a year. Routine eye exams. Ask your health care provider how often you should have your eyes checked. Personal lifestyle choices, including: Daily care of your teeth and gums. Regular physical activity. Eating a healthy diet. Avoiding tobacco and drug use. Limiting alcohol use. Practicing safe sex. Taking low-dose aspirin every day. Taking vitamin and mineral supplements as recommended by your health care provider. What happens during an annual well check? The services and screenings done by your health care provider during your annual well check will depend on your age, overall health, lifestyle risk  factors, and family history of disease. Counseling  Your health care provider may ask you questions about your: Alcohol use. Tobacco use. Drug use. Emotional well-being. Home and relationship well-being. Sexual activity. Eating habits. History of falls. Memory and ability to understand (cognition). Work and work Statistician. Reproductive health. Screening  You may have the following tests or measurements: Height, weight, and BMI. Blood pressure. Lipid and cholesterol levels. These may be checked every 5 years, or more frequently if you are over 42 years old. Skin check. Lung cancer screening. You may have this screening every year starting at age 49 if you have a 30-pack-year history of smoking and currently smoke or have quit within the past 15 years. Fecal occult blood test (FOBT) of the stool. You may have this test every year starting at age 15. Flexible sigmoidoscopy or colonoscopy. You may have a sigmoidoscopy every 5 years or a colonoscopy every 10 years starting at age 90. Hepatitis C blood test. Hepatitis B blood test. Sexually transmitted disease (STD) testing. Diabetes screening. This is done by checking your blood sugar (glucose) after you have not eaten for a while (fasting). You may have this done every 1-3 years. Bone density scan. This is done to screen for osteoporosis. You may have this done starting at age 5. Mammogram. This may be done every 1-2 years. Talk to your health care provider about how often you should have regular mammograms. Talk with your health care provider about your test results, treatment options, and if necessary, the need for more tests. Vaccines  Your health care provider may recommend certain vaccines, such as: Influenza vaccine. This is recommended every year. Tetanus, diphtheria, and acellular pertussis (Tdap, Td) vaccine. You may need a Td booster every 10 years. Zoster vaccine. You  may need this after age 75. Pneumococcal 13-valent  conjugate (PCV13) vaccine. One dose is recommended after age 41. Pneumococcal polysaccharide (PPSV23) vaccine. One dose is recommended after age 64. Talk to your health care provider about which screenings and vaccines you need and how often you need them. This information is not intended to replace advice given to you by your health care provider. Make sure you discuss any questions you have with your health care provider. Document Released: 09/15/2015 Document Revised: 05/08/2016 Document Reviewed: 06/20/2015 Elsevier Interactive Patient Education  2017 [place] Prevention in the Home Falls can cause injuries. They can happen to people of all ages. There are many things you can do to make your home safe and to help prevent falls. What can I do on the outside of my home? Regularly fix the edges of walkways and driveways and fix any cracks. Remove anything that might make you trip as you walk through a door, such as a raised step or threshold. Trim any bushes or trees on the path to your home. Use bright outdoor lighting. Clear any walking paths of anything that might make someone trip, such as rocks or tools. Regularly check to see if handrails are loose or broken. Make sure that both sides of any steps have handrails. Any raised decks and porches should have guardrails on the edges. Have any leaves, snow, or ice cleared regularly. Use sand or salt on walking paths during winter. Clean up any spills in your garage right away. This includes oil or grease spills. What can I do in the bathroom? Use night lights. Install grab bars by the toilet and in the tub and shower. Do not use towel bars as grab bars. Use non-skid mats or decals in the tub or shower. If you need to sit down in the shower, use a plastic, non-slip stool. Keep the floor dry. Clean up any water that spills on the floor as soon as it happens. Remove soap buildup in the tub or shower regularly. Attach bath mats  securely with double-sided non-slip rug tape. Do not have throw rugs and other things on the floor that can make you trip. What can I do in the bedroom? Use night lights. Make sure that you have a light by your bed that is easy to reach. Do not use any sheets or blankets that are too big for your bed. They should not hang down onto the floor. Have a firm chair that has side arms. You can use this for support while you get dressed. Do not have throw rugs and other things on the floor that can make you trip. What can I do in the kitchen? Clean up any spills right away. Avoid walking on wet floors. Keep items that you use a lot in easy-to-reach places. If you need to reach something above you, use a strong step stool that has a grab bar. Keep electrical cords out of the way. Do not use floor polish or wax that makes floors slippery. If you must use wax, use non-skid floor wax. Do not have throw rugs and other things on the floor that can make you trip. What can I do with my stairs? Do not leave any items on the stairs. Make sure that there are handrails on both sides of the stairs and use them. Fix handrails that are broken or loose. Make sure that handrails are as long as the stairways. Check any carpeting to make sure that it is firmly  attached to the stairs. Fix any carpet that is loose or worn. Avoid having throw rugs at the top or bottom of the stairs. If you do have throw rugs, attach them to the floor with carpet tape. Make sure that you have a light switch at the top of the stairs and the bottom of the stairs. If you do not have them, ask someone to add them for you. What else can I do to help prevent falls? Wear shoes that: Do not have high heels. Have rubber bottoms. Are comfortable and fit you well. Are closed at the toe. Do not wear sandals. If you use a stepladder: Make sure that it is fully opened. Do not climb a closed stepladder. Make sure that both sides of the stepladder  are locked into place. Ask someone to hold it for you, if possible. Clearly mark and make sure that you can see: Any grab bars or handrails. First and last steps. Where the edge of each step is. Use tools that help you move around (mobility aids) if they are needed. These include: Canes. Walkers. Scooters. Crutches. Turn on the lights when you go into a dark area. Replace any light bulbs as soon as they burn out. Set up your furniture so you have a clear path. Avoid moving your furniture around. If any of your floors are uneven, fix them. If there are any pets around you, be aware of where they are. Review your medicines with your doctor. Some medicines can make you feel dizzy. This can increase your chance of falling. Ask your doctor what other things that you can do to help prevent falls. This information is not intended to replace advice given to you by your health care provider. Make sure you discuss any questions you have with your health care provider. Document Released: 06/15/2009 Document Revised: 01/25/2016 Document Reviewed: 09/23/2014 Elsevier Interactive Patient Education  2017 [employer].

## 2021-04-10 NOTE — Progress Notes (Signed)
Subjective:   Brandi Mccormick is a 80 y.o. female who presents for Medicare Annual (Subsequent) preventive examination.  Review of Systems     Cardiac Risk Factors include: advanced age (>59men, >57 women);diabetes mellitus;dyslipidemia;hypertension     Objective:    Today's Vitals   04/10/21 1021  BP: 102/68  Pulse: 65  Resp: 16  Temp: 97.7 F (36.5 C)  TempSrc: Temporal  SpO2: 98%  Weight: 127 lb 6.4 oz (57.8 kg)  Height: 5\' 4"  (1.626 m)   Body mass index is 21.87 kg/m.  Advanced Directives 04/10/2021 10/26/2020 10/06/2020 04/06/2020 01/25/2020 04/05/2019 08/17/2018  Does Patient Have a Medical Advance Directive? No No No No No No No  Does patient want to make changes to medical advance directive? - - - - - No - Patient declined -  Would patient like information on creating a medical advance directive? No - Patient declined No - Patient declined No - Patient declined No - Patient declined No - Patient declined - No - Patient declined    Current Medications (verified) Outpatient Encounter Medications as of 04/10/2021  Medication Sig   amLODipine (NORVASC) 10 MG tablet Take 1 tablet by mouth once daily   clopidogrel (PLAVIX) 75 MG tablet Take 1 tablet (75 mg total) by mouth daily.   famotidine (PEPCID) 20 MG tablet Take 20 mg by mouth daily.   glipiZIDE (GLUCOTROL XL) 5 MG 24 hr tablet Take 2 tablets (10 mg total) by mouth daily.   glucose blood (ONETOUCH VERIO) test strip USE 1 STRIP TO CHECK GLUCOSE 2x DAILY   hydroxyurea (HYDREA) 500 MG capsule Take 1 capsule by mouth once daily   Lancets (ONETOUCH DELICA PLUS [id number]) MISC USE 1  TO CHECK GLUCOSE TWICE DAILY   lisinopril (ZESTRIL) 40 MG tablet Take 1 tablet by mouth once daily   metoprolol succinate (TOPROL-XL) 25 MG 24 hr tablet Take 1 tablet by mouth once daily   pantoprazole (PROTONIX) 40 MG tablet Take 1 tablet by mouth once daily   rosuvastatin (CRESTOR) 40 MG tablet Take 1 tablet (40 mg total) by mouth daily.   No  facility-administered encounter medications on file as of 04/10/2021.    Allergies (verified) Penicillins   History: Past Medical History:  Diagnosis Date   CVA (cerebral vascular accident) ([place]) 10/2020   Diabetes ([place])    GERD (gastroesophageal reflux disease)    Hyperlipidemia    Hypertension    Hyperthyroidism    Pneumonia    Shingles    Past Surgical History:  Procedure Laterality Date   CATARACT EXTRACTION Left 2020   COLONOSCOPY     DENTAL SURGERY     IR ANGIO INTRA EXTRACRAN SEL COM CAROTID INNOMINATE BILAT MOD SED  10/09/2020   IR ANGIO VERTEBRAL SEL VERTEBRAL BILAT MOD SED  10/09/2020   Family History  Problem Relation Age of Onset   Diabetes Mother        and on mother's side of family   Colon cancer Mother    Stroke Mother    Diabetes Father        and on father's side of family   Diabetes Sister    Diabetes Sister    Diabetes Brother    Heart disease Neg Hx    Rectal cancer Neg Hx    Stomach cancer Neg Hx    Social History   Socioeconomic History   Marital status: Married    Spouse name: Not on file   Number of children: Not on  file   Years of education: Not on file   Highest education level: Not on file  Occupational History   Occupation: [employer]--    Comment: retired  Tobacco Use   Smoking status: Never   Smokeless tobacco: Never  Vaping Use   Vaping Use: Never used  Substance and Sexual Activity   Alcohol use: Yes    Alcohol/week: 0.0 standard drinks    Comment: occasional wine cooler   Drug use: No   Sexual activity: Never    Partners: Male  Other Topics Concern   Not on file  Social History Narrative   Exercise-- yard work,  Walking in [employer]   03/15/21 lives with husband   Social Determinants of Health   Financial Resource Strain: Low Risk    Difficulty of Paying Living Expenses: Not hard at all  Food Insecurity: No Food Insecurity   Worried About Charity fundraiser in the Last Year: Never true   Arboriculturist in the Last Year:  Never true  Transportation Needs: No Transportation Needs   Lack of Transportation (Medical): No   Lack of Transportation (Non-Medical): No  Physical Activity: Insufficiently Active   Days of Exercise per Week: 3 days   Minutes of Exercise per Session: 30 min  Stress: No Stress Concern Present   Feeling of Stress : Not at all  Social Connections: Moderately Isolated   Frequency of Communication with Friends and Family: More than three times a week   Frequency of Social Gatherings with Friends and Family: More than three times a week   Attends Religious Services: Never   Marine scientist or Organizations: No   Attends Music therapist: Never   Marital Status: Married    Tobacco Counseling Counseling given: Not Answered   Clinical Intake:  Pre-visit preparation completed: Yes  Pain : No/denies pain     Nutritional Status: BMI of 19-24  Normal Nutritional Risks: None Diabetes: Yes CBG done?: No Did pt. bring in CBG monitor from home?: No  How often do you need to have someone help you when you read instructions, pamphlets, or other written materials from your doctor or pharmacy?: 1 - Never  Diabetes:  Is the patient diabetic?  Yes  If diabetic, was a CBG obtained today?  No  Did the patient bring in their glucometer from home?  No  How often do you monitor your CBG's? daily.   Financial Strains and Diabetes Management:  Are you having any financial strains with the device, your supplies or your medication? No .  Does the patient want to be seen by Chronic Care Management for management of their diabetes?  No  Would the patient like to be referred to a Nutritionist or for Diabetic Management?  No   Diabetic Exams:  Diabetic Eye Exam: . Overdue for diabetic eye exam. Pt has been advised about the importance in completing this exam.   Diabetic Foot Exam: Completed 11/29/2020.   Interpreter Needed?: No  Information entered by :: Caroleen Hamman  LPN   Activities of Daily Living In your present state of health, do you have any difficulty performing the following activities: 04/10/2021 10/06/2020  Hearing? N N  Vision? N N  Difficulty concentrating or making decisions? N N  Walking or climbing stairs? N Y  Dressing or bathing? N N  Doing errands, shopping? N N  Preparing Food and eating ? N -  Using the Toilet? N -  In the past six  months, have you accidently leaked urine? N -  Do you have problems with loss of bowel control? N -  Managing your Medications? N -  Managing your Finances? N -  Housekeeping or managing your Housekeeping? N -  Some recent data might be hidden    Patient Care Team: Carollee Herter, Alferd Apa, DO as PCP - General (Family Medicine) Philemon Kingdom, MD as Consulting Physician (Internal Medicine) [facility] as Consulting Physician (Podiatry) Justin Mend, MD as Attending Physician (Nephrology) [name], DPM as Consulting Physician (Podiatry) Luberta Mutter, MD as Consulting Physician (Ophthalmology) Marzetta Board, DPM as Consulting Physician (Podiatry)  Indicate any recent Medical Services you may have received from other than [facility] providers in the past year (date may be approximate).     Assessment:   This is a routine wellness examination for Brandi Mccormick.  Hearing/Vision screen Hearing Screening - Comments:: No issues Vision Screening - Comments:: Last eye exam-2021-  Dietary issues and exercise activities discussed: Current Exercise Habits: Home exercise routine, Type of exercise: Other - see comments (gardening), Time (Minutes): 30, Frequency (Times/Week): 3, Weekly Exercise (Minutes/Week): 90, Intensity: Mild, Exercise limited by: None identified   Goals Addressed             This Visit's Progress    DIET - INCREASE WATER INTAKE   On track    Reduce sugar intake to 25 grams per day   On track    Trade cake and pie for fruit.           Depression Screen PHQ 2/9 Scores 04/10/2021 04/06/2020 04/05/2019 04/02/2018 03/21/2017 06/23/2015 06/23/2015  PHQ - 2 Score 0 0 0 0 0 0 0    Fall Risk Fall Risk  04/10/2021 03/15/2021 04/06/2020 04/05/2019 04/02/2018  Falls in the past year? 0 0 0 0 No  Number falls in past yr: 0 - 0 - -  Injury with Fall? 0 - 0 - -  Risk for fall due to : - - - - -  Follow up Falls prevention discussed - Education provided;Falls prevention discussed - -    FALL RISK PREVENTION PERTAINING TO THE HOME:  Any stairs in or around the home? No  Home free of loose throw rugs in walkways, pet beds, electrical cords, etc? Yes  Adequate lighting in your home to reduce risk of falls? Yes   ASSISTIVE DEVICES UTILIZED TO PREVENT FALLS:  Life alert? No  Use of a cane, walker or w/c? No  Grab bars in the bathroom? No  Shower chair or bench in shower? No  Elevated toilet seat or a handicapped toilet? No   TIMED UP AND GO:  Was the test performed? Yes .  Length of time to ambulate 10 feet: 10 sec.   Gait steady and fast without use of assistive device  Cognitive Function: MMSE - Mini Mental State Exam 04/06/2020 04/02/2018 03/21/2017 04/24/2015  Not completed: Refused - - -  Orientation to time - 5 5 5   Orientation to Place - 5 5 5   Registration - 3 3 3   Attention/ Calculation - 4 3 4   Recall - 2 2 2   Language- name 2 objects - 2 2 2   Language- repeat - 1 1 1   Language- follow 3 step command - 3 3 3   Language- read & follow direction - 1 1 1   Write a sentence - 1 1 1   Copy design - 1 1 1   Total score - 28 27  28     6CIT Screen 04/10/2021  What Year? 0 points  What month? 0 points  What time? 0 points  Count back from 20 0 points  Months in reverse 4 points  Repeat phrase 0 points  Total Score 4    Immunizations Immunization History  Administered Date(s) Administered   PFIZER(Purple Top)SARS-COV-2 Vaccination 10/31/2019, 11/24/2019, 08/01/2020   Pneumococcal Polysaccharide-23 11/16/2013    TDAP status:  Up to date  Flu vaccine status: Due 05/2021  Pneumococcal vaccine status: Due, Education has been provided regarding the importance of this vaccine. Advised may receive this vaccine at local pharmacy or Health Dept. Aware to provide a copy of the vaccination record if obtained from local pharmacy or Health Dept. Verbalized acceptance and understanding.  Covid-19 vaccine status: Information provided on how to obtain vaccines. Booster due  Qualifies for Shingles Vaccine? Yes   Zostavax completed No   Shingrix Completed?: No.    Education has been provided regarding the importance of this vaccine. Patient has been advised to call insurance company to determine out of pocket expense if they have not yet received this vaccine. Advised may also receive vaccine at local pharmacy or Health Dept. Verbalized acceptance and understanding.  Screening Tests Health Maintenance  Topic Date Due   Zoster Vaccines- Shingrix (1 of 2) Never done   OPHTHALMOLOGY EXAM  09/14/2020   COVID-19 Vaccine (4 - Booster for Pfizer series) 10/30/2020   INFLUENZA VACCINE  04/02/2021   PNA vac Low Risk Adult (2 of 2 - PCV13) 05/11/2021 (Originally 11/17/2014)   HEMOGLOBIN A1C  09/07/2021   FOOT EXAM  11/29/2021   COLONOSCOPY (Pts 45-42yrs Insurance coverage will need to be confirmed)  02/05/2022   TETANUS/TDAP  06/22/2025   Hepatitis C Screening  Completed   DEXA SCAN  Addressed   HPV VACCINES  Aged Out    Health Maintenance  Health Maintenance Due  Topic Date Due   Zoster Vaccines- Shingrix (1 of 2) Never done   OPHTHALMOLOGY EXAM  09/14/2020   COVID-19 Vaccine (4 - Booster for Pfizer series) 10/30/2020   INFLUENZA VACCINE  04/02/2021    Colorectal cancer screening: No longer required.   Mammogram status: Declined  Bone Density status: Declined  Lung Cancer Screening: (Low Dose CT Chest recommended if Age 29-80 years, 30 pack-year currently smoking OR have quit w/in 15years.) does not qualify.      Additional Screening:  Hepatitis C Screening: does not qualify  Vision Screening: Recommended annual ophthalmology exams for early detection of glaucoma and other disorders of the eye. Is the patient up to date with their annual eye exam?  No  Who is the provider or what is the name of the office in which the patient attends annual eye exams? Pt unsure of name   Dental Screening: Recommended annual dental exams for proper oral hygiene  Community Resource Referral / Chronic Care Management: CRR required this visit?  No   CCM required this visit?  No      Plan:     I have personally reviewed and noted the following in the patient's chart:   Medical and social history Use of alcohol, tobacco or illicit drugs  Current medications and supplements including opioid prescriptions.  Functional ability and status Nutritional status Physical activity Advanced directives List of other physicians Hospitalizations, surgeries, and ER visits in previous 12 months Vitals Screenings to include cognitive, depression, and falls Referrals and appointments  In addition, I have reviewed and discussed with patient certain  preventive protocols, quality metrics, and best practice recommendations. A written personalized care plan for preventive services as well as general preventive health recommendations were provided to patient.   Patient to access avs on mychart.  Marta Antu, LPN   02/08/8613  Nurse Health Advisor  Nurse Notes: None

## 2021-05-01 ENCOUNTER — Other Ambulatory Visit: Payer: Self-pay | Admitting: Family Medicine

## 2021-05-01 DIAGNOSIS — E785 Hyperlipidemia, unspecified: Secondary | ICD-10-CM

## 2021-05-15 ENCOUNTER — Inpatient Hospital Stay: Payer: Medicare HMO | Attending: Nurse Practitioner

## 2021-05-15 ENCOUNTER — Other Ambulatory Visit: Payer: Self-pay

## 2021-05-15 ENCOUNTER — Inpatient Hospital Stay: Payer: Medicare HMO | Admitting: Oncology

## 2021-05-15 VITALS — BP 131/61 | HR 82 | Temp 98.1°F | Resp 18 | Ht 64.0 in | Wt 126.8 lb

## 2021-05-15 DIAGNOSIS — D45 Polycythemia vera: Secondary | ICD-10-CM | POA: Insufficient documentation

## 2021-05-15 DIAGNOSIS — I10 Essential (primary) hypertension: Secondary | ICD-10-CM | POA: Insufficient documentation

## 2021-05-15 DIAGNOSIS — Z7901 Long term (current) use of anticoagulants: Secondary | ICD-10-CM | POA: Diagnosis not present

## 2021-05-15 DIAGNOSIS — Z7902 Long term (current) use of antithrombotics/antiplatelets: Secondary | ICD-10-CM | POA: Diagnosis not present

## 2021-05-15 DIAGNOSIS — D471 Chronic myeloproliferative disease: Secondary | ICD-10-CM

## 2021-05-15 DIAGNOSIS — E119 Type 2 diabetes mellitus without complications: Secondary | ICD-10-CM | POA: Diagnosis not present

## 2021-05-15 DIAGNOSIS — Z79899 Other long term (current) drug therapy: Secondary | ICD-10-CM | POA: Diagnosis not present

## 2021-05-15 DIAGNOSIS — N051 Unspecified nephritic syndrome with focal and segmental glomerular lesions: Secondary | ICD-10-CM | POA: Diagnosis not present

## 2021-05-15 DIAGNOSIS — Z8673 Personal history of transient ischemic attack (TIA), and cerebral infarction without residual deficits: Secondary | ICD-10-CM | POA: Insufficient documentation

## 2021-05-15 LAB — CBC WITH DIFFERENTIAL (CANCER CENTER ONLY)
Abs Immature Granulocytes: 0.02 10*3/uL (ref 0.00–0.07)
Basophils Absolute: 0 10*3/uL (ref 0.0–0.1)
Basophils Relative: 1 %
Eosinophils Absolute: 0.1 10*3/uL (ref 0.0–0.5)
Eosinophils Relative: 2 %
HCT: 35.2 % — ABNORMAL LOW (ref 36.0–46.0)
Hemoglobin: 11.7 g/dL — ABNORMAL LOW (ref 12.0–15.0)
Immature Granulocytes: 0 %
Lymphocytes Relative: 23 %
Lymphs Abs: 1.2 10*3/uL (ref 0.7–4.0)
MCH: 34.2 pg — ABNORMAL HIGH (ref 26.0–34.0)
MCHC: 33.2 g/dL (ref 30.0–36.0)
MCV: 102.9 fL — ABNORMAL HIGH (ref 80.0–100.0)
Monocytes Absolute: 0.5 10*3/uL (ref 0.1–1.0)
Monocytes Relative: 10 %
Neutro Abs: 3.5 10*3/uL (ref 1.7–7.7)
Neutrophils Relative %: 64 %
Platelet Count: 430 10*3/uL — ABNORMAL HIGH (ref 150–400)
RBC: 3.42 MIL/uL — ABNORMAL LOW (ref 3.87–5.11)
RDW: 14.3 % (ref 11.5–15.5)
WBC Count: 5.4 10*3/uL (ref 4.0–10.5)
nRBC: 0 % (ref 0.0–0.2)

## 2021-05-15 NOTE — Progress Notes (Signed)
  [facility] OFFICE PROGRESS NOTE   Diagnosis: Polycythemia vera  INTERVAL HISTORY:   [name] returns as scheduled.  She continues hydroxyurea.  No bleeding or symptom of thrombosis.  No complaint.  Objective:  Vital signs in last 24 hours:  Blood pressure 131/61, pulse 82, temperature 98.1 F (36.7 C), temperature source Oral, resp. rate 18, height 5\' 4"  (1.626 m), weight 126 lb 12.8 oz (57.5 kg), SpO2 100 %.    HEENT: No thrush or ulcers Resp: Lungs clear bilaterally Cardio: Regular rate and rhythm GI: No hepatosplenomegaly Vascular: No leg edema  Lab Results:  Lab Results  Component Value Date   WBC 5.4 05/15/2021   HGB 11.7 (L) 05/15/2021   HCT 35.2 (L) 05/15/2021   MCV 102.9 (H) 05/15/2021   PLT 430 (H) 05/15/2021   NEUTROABS 3.5 05/15/2021    CMP  Lab Results  Component Value Date   NA 140 12/07/2020   K 5.0 12/07/2020   CL 110 12/07/2020   CO2 24 12/07/2020   GLUCOSE 186 (H) 12/07/2020   BUN 27 (H) 12/07/2020   CREATININE 2.65 (H) 12/07/2020   CALCIUM 9.2 12/07/2020   PROT 6.4 12/07/2020   ALBUMIN 4.0 12/07/2020   AST 14 12/07/2020   ALT 14 12/07/2020   ALKPHOS 57 12/07/2020   BILITOT 0.4 12/07/2020   GFRNONAA 24 (L) 10/09/2020   GFRAA 24 (L) 06/10/2019     Medications: I have reviewed the patient's current medications.   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  The hemoglobin and platelet count remain in goal range.  She will continue hydroxyurea at the current dose.  She will return for an office and lab visit in 6 weeks.  Betsy Coder, MD  05/15/2021  11:53 AM

## 2021-05-22 ENCOUNTER — Telehealth (HOSPITAL_COMMUNITY): Payer: Self-pay

## 2021-05-22 NOTE — Telephone Encounter (Signed)
Called to schedule cta head/neck, no answer, left vm. AW 

## 2021-05-22 NOTE — Telephone Encounter (Signed)
Pt does not want to schedule a f/u with Dr. Estanislado Pandy at this time. AW

## 2021-06-19 ENCOUNTER — Other Ambulatory Visit: Payer: Self-pay | Admitting: Oncology

## 2021-06-22 ENCOUNTER — Ambulatory Visit: Payer: Medicare HMO | Admitting: Podiatry

## 2021-06-22 ENCOUNTER — Other Ambulatory Visit: Payer: Self-pay

## 2021-06-22 DIAGNOSIS — E119 Type 2 diabetes mellitus without complications: Secondary | ICD-10-CM | POA: Diagnosis not present

## 2021-06-22 DIAGNOSIS — M79675 Pain in left toe(s): Secondary | ICD-10-CM

## 2021-06-22 DIAGNOSIS — M79674 Pain in right toe(s): Secondary | ICD-10-CM

## 2021-06-22 DIAGNOSIS — B351 Tinea unguium: Secondary | ICD-10-CM

## 2021-06-26 ENCOUNTER — Other Ambulatory Visit: Payer: Self-pay

## 2021-06-26 ENCOUNTER — Encounter: Payer: Self-pay | Admitting: Nurse Practitioner

## 2021-06-26 ENCOUNTER — Inpatient Hospital Stay: Payer: Medicare HMO | Attending: Nurse Practitioner

## 2021-06-26 ENCOUNTER — Inpatient Hospital Stay: Payer: Medicare HMO | Admitting: Nurse Practitioner

## 2021-06-26 VITALS — BP 119/60 | HR 72 | Temp 97.8°F | Resp 18 | Ht 64.0 in | Wt 124.4 lb

## 2021-06-26 DIAGNOSIS — D471 Chronic myeloproliferative disease: Secondary | ICD-10-CM

## 2021-06-26 DIAGNOSIS — D45 Polycythemia vera: Secondary | ICD-10-CM | POA: Diagnosis not present

## 2021-06-26 LAB — CMP (CANCER CENTER ONLY)
ALT: 12 U/L (ref 0–44)
AST: 14 U/L — ABNORMAL LOW (ref 15–41)
Albumin: 4 g/dL (ref 3.5–5.0)
Alkaline Phosphatase: 64 U/L (ref 38–126)
Anion gap: 9 (ref 5–15)
BUN: 33 mg/dL — ABNORMAL HIGH (ref 8–23)
CO2: 20 mmol/L — ABNORMAL LOW (ref 22–32)
Calcium: 9.5 mg/dL (ref 8.9–10.3)
Chloride: 107 mmol/L (ref 98–111)
Creatinine: 2.75 mg/dL — ABNORMAL HIGH (ref 0.44–1.00)
GFR, Estimated: 17 mL/min — ABNORMAL LOW (ref >60)
Glucose, Bld: 125 mg/dL — ABNORMAL HIGH (ref 70–99)
Potassium: 4 mmol/L (ref 3.5–5.1)
Sodium: 136 mmol/L (ref 135–145)
Total Bilirubin: 0.5 mg/dL (ref 0.3–1.2)
Total Protein: 7 g/dL (ref 6.5–8.1)

## 2021-06-26 LAB — CBC WITH DIFFERENTIAL (CANCER CENTER ONLY)
Abs Immature Granulocytes: 0.01 10*3/uL (ref 0.00–0.07)
Basophils Absolute: 0 10*3/uL (ref 0.0–0.1)
Basophils Relative: 1 %
Eosinophils Absolute: 0 10*3/uL (ref 0.0–0.5)
Eosinophils Relative: 1 %
HCT: 37.4 % (ref 36.0–46.0)
Hemoglobin: 12.2 g/dL (ref 12.0–15.0)
Immature Granulocytes: 0 %
Lymphocytes Relative: 28 %
Lymphs Abs: 1.4 10*3/uL (ref 0.7–4.0)
MCH: 33.2 pg (ref 26.0–34.0)
MCHC: 32.6 g/dL (ref 30.0–36.0)
MCV: 101.6 fL — ABNORMAL HIGH (ref 80.0–100.0)
Monocytes Absolute: 0.5 10*3/uL (ref 0.1–1.0)
Monocytes Relative: 9 %
Neutro Abs: 3.1 10*3/uL (ref 1.7–7.7)
Neutrophils Relative %: 61 %
Platelet Count: 455 10*3/uL — ABNORMAL HIGH (ref 150–400)
RBC: 3.68 MIL/uL — ABNORMAL LOW (ref 3.87–5.11)
RDW: 15.2 % (ref 11.5–15.5)
WBC Count: 5 10*3/uL (ref 4.0–10.5)
nRBC: 0 % (ref 0.0–0.2)

## 2021-06-26 NOTE — Progress Notes (Signed)
  [facility] OFFICE PROGRESS NOTE   Diagnosis: Polycythemia vera  INTERVAL HISTORY:   Ms. Faust returns as scheduled.  She continues hydroxyurea.  Overall she feels well.  No nausea or vomiting.  No mouth sores.  No diarrhea.  No rash.  No bleeding or symptoms of thrombosis.  Objective:  Vital signs in last 24 hours:  Blood pressure 119/60, pulse 72, temperature 97.8 F (36.6 C), temperature source Oral, resp. rate 18, height 5\' 4"  (1.626 m), weight 124 lb 6.4 oz (56.4 kg), SpO2 100 %.    HEENT: No thrush or ulcers. Resp: Lungs clear bilaterally. Cardio: Regular rate and rhythm. GI: Abdomen soft and nontender.  No hepatosplenomegaly. Vascular: No leg edema. Skin: No rash.   Lab Results:  Lab Results  Component Value Date   WBC 5.0 06/26/2021   HGB 12.2 06/26/2021   HCT 37.4 06/26/2021   MCV 101.6 (H) 06/26/2021   PLT 455 (H) 06/26/2021   NEUTROABS 3.1 06/26/2021    Imaging:  No results found.  Medications: I have reviewed the patient's current medications.  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 Haan appears stable.  The hemoglobin and platelet count remain in goal range.  She will continue hydroxyurea at the current dose which we confirmed at today's visit to be 500 mg Monday through Thursday, off Friday, Saturday, Sunday.  She will return for lab and follow-up in 6  weeks.    Ned Card ANP/GNP-BC   06/26/2021  12:01 PM

## 2021-06-29 ENCOUNTER — Encounter: Payer: Self-pay | Admitting: Podiatry

## 2021-06-29 NOTE — Progress Notes (Signed)
  Subjective:  Patient ID: Brandi Mccormick, female    DOB: [birth year],  MRN: [id number]  Brandi Mccormick presents to clinic today for preventative diabetic foot care and thick, elongated toenails both feet which are tender when wearing enclosed shoe gear.  Patient did not check blood glucose today.  She voices no new pedal problems on today's visit.  PCP is Carollee Herter, Kendrick Fries R, DO , and last visit was six weeks ago.  Allergies  Allergen Reactions   Penicillins Nausea And Vomiting    Has patient had a PCN reaction causing immediate rash, facial/tongue/throat swelling, SOB or lightheadedness with hypotension: no Has patient had a PCN reaction causing severe rash involving mucus membranes or skin necrosis: no Has patient had a PCN reaction that required hospitalization: no Has patient had a PCN reaction occurring within the last 10 years: no If all of the above answers are "NO", then may proceed with Cephalosporin use.     Review of Systems: Negative except as noted in the HPI. Objective:   Constitutional Brandi Mccormick is a pleasant 80 y.o. African American female, WD, WN in NAD. AAO x 3.   Vascular Capillary refill time to digits immediate b/l. Faintly palpable pedal pulses b/l. Pedal hair sparse. Lower extremity skin temperature gradient within normal limits. No pain with calf compression RLE. No edema noted b/l LE. No cyanosis or clubbing noted.  Neurologic Normal speech. Oriented to person, place, and time. Protective sensation intact 5/5 intact bilaterally with 10g monofilament b/l.  Dermatologic Skin warm and supple b/l lower extremities. No open wounds b/l LE. No interdigital macerations noted b/l LE. Toenails 1-5 b/l elongated, discolored, dystrophic, thickened, crumbly with subungual debris and tenderness to dorsal palpation.  Orthopedic: Normal muscle strength 5/5 to all lower extremity muscle groups bilaterally. No gross bony deformities b/l lower extremities.    Radiographs: None Assessment:   1. Pain due to onychomycosis of toenails of both feet   2. Controlled type 2 diabetes mellitus without complication, without long-term current use of insulin ([place])    Plan:  Patient was evaluated and treated and all questions answered. Consent given for treatment as described below: -No new findings. No new orders. -Continue diabetic foot care principles: inspect feet daily, monitor glucose as recommended by PCP and/or Endocrinologist, and follow prescribed diet per PCP, Endocrinologist and/or dietician. -Toenails b/l lower extremities debrided in length and girth without iatrogenic bleeding with sterile nail nipper and dremel.  -Patient/POA to call should there be question/concern in the interim.  Return in about 3 months (around 09/22/2021).  Marzetta Board, DPM

## 2021-07-11 ENCOUNTER — Other Ambulatory Visit: Payer: Self-pay

## 2021-07-11 ENCOUNTER — Ambulatory Visit (INDEPENDENT_AMBULATORY_CARE_PROVIDER_SITE_OTHER): Payer: Medicare HMO | Admitting: Internal Medicine

## 2021-07-11 ENCOUNTER — Encounter: Payer: Self-pay | Admitting: Internal Medicine

## 2021-07-11 VITALS — BP 100/60 | HR 75 | Ht 64.0 in | Wt 125.0 lb

## 2021-07-11 DIAGNOSIS — Z8639 Personal history of other endocrine, nutritional and metabolic disease: Secondary | ICD-10-CM | POA: Diagnosis not present

## 2021-07-11 DIAGNOSIS — E785 Hyperlipidemia, unspecified: Secondary | ICD-10-CM

## 2021-07-11 DIAGNOSIS — E1165 Type 2 diabetes mellitus with hyperglycemia: Secondary | ICD-10-CM

## 2021-07-11 DIAGNOSIS — E1169 Type 2 diabetes mellitus with other specified complication: Secondary | ICD-10-CM

## 2021-07-11 LAB — POCT GLYCOSYLATED HEMOGLOBIN (HGB A1C): Hemoglobin A1C: 7.9 % — AB (ref 4.0–5.6)

## 2021-07-11 LAB — TSH: TSH: 0.72 u[IU]/mL (ref 0.35–5.50)

## 2021-07-11 LAB — T3, FREE: T3, Free: 2.6 pg/mL (ref 2.3–4.2)

## 2021-07-11 LAB — T4, FREE: Free T4: 1.04 ng/dL (ref 0.60–1.60)

## 2021-07-11 NOTE — Patient Instructions (Addendum)
Please continue: - Glipizide XL 10 mg before breakfast  NO SWEET TEA AND REGULAR SODAS!  Please stop at the lab.  Please return in 4 months with your sugar log.

## 2021-07-11 NOTE — Progress Notes (Signed)
Patient ID: Brandi Mccormick, female   DOB: December 27, 1940, 80 y.o.   MRN: [id number]  This visit occurred during the SARS-CoV-2 public health emergency.  Safety protocols were in place, including screening questions prior to the visit, additional usage of staff PPE, and extensive cleaning of exam room while observing appropriate contact time as indicated for disinfecting solutions.   HPI: Brandi Mccormick is a 80 y.o.-year-old female, presenting for f/u for DM2, dx 2013, non-insulin-dependent, uncontrolled, with complications (CVA 10/4740, microalbuminuria) and h/o hyperthyroidism. Last visit 4 mo ago.  Interim history: No increased urination, blurry vision, nausea, chest pain. Had a gout attack in [place] last week >> improved now. She now drinks less sweet drinks.  DM2: Reviewed HbA1c levels: Lab Results  Component Value Date   HGBA1C 6.9 (A) 03/07/2021   HGBA1C 8.0 (H) 10/07/2020   HGBA1C 7.3 (A) 06/29/2020   HGBA1C 7.2 (A) 02/24/2020   HGBA1C 7.2 (H) 11/04/2019   HGBA1C 6.6 (H) 05/06/2019   HGBA1C 6.1 12/21/2018   HGBA1C 8.3 (H) 09/11/2018   HGBA1C 6.9 (A) 06/04/2018   HGBA1C 7.6 (A) 02/02/2018   HGBA1C 7.4 10/03/2017   HGBA1C 7.0 (H) 04/18/2017   HGBA1C 6.9 02/14/2017   HGBA1C 7.3 11/15/2016   HGBA1C 6.6 06/28/2016  11/02/2020: HbA1c calculated from fructosamine: 10%, even higher than the directly measured HbA1c...  Pt is on a regimen of: - Glipizide XL 5 mg in a.m.  >> 10 (2x 5 mg) mg in a.m.  We tried Tradjenta 5 mg daily in am - too expensive. She was previously on Janumet XR and Metformin ER, but this was stopped 05/2019 due to declining kidney function.  Pt checks her sugars once a day per review of her detailed log: - am:  92-130, 141 >> 88, 90-137, 140 >> 85-130, 135 - 2h after b'fast:  124 >> 125-152 >> 138-144 >> 124-136, 146 - before lunch: 100-152 >> 129 >> 115-138 >> 123, 137 - 2h after lunch:   128-152 >>  131-135 >> 138, 148 >> 113-147 - before dinner: 89,  140-145 >> 97-133 >> 113, 138 >> n/c - 2h after dinner: 138, 146 >> 142, 150 >> 117-147, 153 - bedtime:  137-169 >> 153 >> 145 >> n/c >> 99-143 >> 123 - nighttime: n/c >> 161 >> n/c Lowest sugar: 72 >> 92 >> 88 >> 85; it is unclear at which level she has hypoglycemia awareness. Highest sugar: 152 >> 152 >> 150 >> 156.  Meter: OneTouch Verio.  Pt's meals are: - Breakfast: egg, coffee, juice - Lunch: No Lunch - Dinner: meat + vegetables >> late dinner during the weekend as her son is cooking and brings her food - Snacks: 1-2 Some regular sodas, rarely apple juice.  -+ CKD (FSGS-sees nephrology), last BUN/creatinine:  Lab Results  Component Value Date   BUN 33 (H) 06/26/2021   CREATININE 2.75 (H) 06/26/2021   Lab Results  Component Value Date   GFRAA 24 (L) 06/10/2019   GFRAA 27 (L) 04/28/2019   GFRAA 43 (L) 07/27/2018   GFRAA >60 06/26/2018   GFRAA 38 (L) 06/02/2018   GFRAA 82 06/28/2016   GFRAA >90 06/25/2014   GFRAA >90 06/24/2014   GFRAA >90 10/23/2013  12/21/2018: GFR 28.9   She has MAU: Lab Results  Component Value Date   MICRALBCREAT 43.8 (H) 11/04/2019   MICRALBCREAT 77.6 (H) 05/06/2019   MICRALBCREAT 422.7 (H) 04/02/2018   MICRALBCREAT 225.8 (H) 11/15/2016   MICRALBCREAT 48.8 (H) 06/28/2016  MICRALBCREAT 9.9 02/21/2014   MICRALBCREAT 8.8 11/16/2013  On lisinopril 40.  -+ CKD; last set of lipids: Lab Results  Component Value Date   CHOL 163 12/07/2020   HDL 56.70 12/07/2020   LDLCALC 88 12/07/2020   TRIG 94.0 12/07/2020   CHOLHDL 3 12/07/2020  Prev. on Simvastatin >> off in 06/2016 b/c fatigue.  She started pravastatin 80 mg daily in 12/2016 >> currently on Crestor 40.   - no numbness and tingling in her feet.  + Onychomycosis  - Last eye exam in 09/2019: No DR reportedly; Had OS cataract sx.   H/o hyperthyroidism - previously treated with medication, more than 20 years ago.  No other history available.  Pt denies: - feeling nodules in neck -  hoarseness - dysphagia - choking - SOB with lying down  Her TFTs were normal: Lab Results  Component Value Date   TSH 0.37 11/02/2020   TSH 1.12 05/12/2019   TSH 0.635 06/26/2018   TSH 1.11 04/02/2018   TSH 1.13 07/03/2017   TSH 1.12 06/28/2016   TSH 1.29 06/23/2015   TSH 1.44 10/18/2014   TSH 0.93 08/15/2014   TSH 0.44 02/17/2014   FREET4 0.85 05/12/2019   FREET4 0.93 07/03/2017   FREET4 0.96 06/28/2016   FREET4 0.91 08/15/2014   FREET4 1.00 02/17/2014   FREET4 0.97 12/20/2013   T3FREE 2.7 05/12/2019   T3FREE 3.2 07/03/2017   T3FREE 3.1 06/28/2016   T3FREE 3.2 08/15/2014   T3FREE 2.6 02/17/2014   T3FREE 2.9 12/20/2013   She had a CVA on 10/06/2020 >> started PT. She has chronic myeloproliferative ds.  ROS: + see HPI  I reviewed pt's medications, allergies, PMH, social hx, family hx, and changes were documented in the history of present illness. Otherwise, unchanged from my initial visit note.  Past Medical History:  Diagnosis Date   CVA (cerebral vascular accident) ([place]) 10/2020   Diabetes ([place])    GERD (gastroesophageal reflux disease)    Hyperlipidemia    Hypertension    Hyperthyroidism    Pneumonia    Shingles    Past Surgical History:  Procedure Laterality Date   CATARACT EXTRACTION Left 2020   COLONOSCOPY     DENTAL SURGERY     IR ANGIO INTRA EXTRACRAN SEL COM CAROTID INNOMINATE BILAT MOD SED  10/09/2020   IR ANGIO VERTEBRAL SEL VERTEBRAL BILAT MOD SED  10/09/2020   Social History   Socioeconomic History   Marital status: Married    Spouse name: Not on file   Number of children: Not on file   Years of education: Not on file   Highest education level: Not on file  Occupational History   Occupation: ABB--    Comment: retired  Tobacco Use   Smoking status: Never   Smokeless tobacco: Never  Vaping Use   Vaping Use: Never used  Substance and Sexual Activity   Alcohol use: Yes    Alcohol/week: 0.0 standard drinks    Comment: occasional wine  cooler   Drug use: No   Sexual activity: Never    Partners: Male  Other Topics Concern   Not on file  Social History Narrative   Exercise-- yard work,  Walking in [employer]   03/15/21 lives with husband   Social Determinants of Health   Financial Resource Strain: Low Risk    Difficulty of Paying Living Expenses: Not hard at all  Food Insecurity: No Food Insecurity   Worried About Charity fundraiser in the Last Year:  Never true   Ran Out of Food in the Last Year: Never true  Transportation Needs: No Transportation Needs   Lack of Transportation (Medical): No   Lack of Transportation (Non-Medical): No  Physical Activity: Insufficiently Active   Days of Exercise per Week: 3 days   Minutes of Exercise per Session: 30 min  Stress: No Stress Concern Present   Feeling of Stress : Not at all  Social Connections: Moderately Isolated   Frequency of Communication with Friends and Family: More than three times a week   Frequency of Social Gatherings with Friends and Family: More than three times a week   Attends Religious Services: Never   Marine scientist or Organizations: No   Attends Music therapist: Never   Marital Status: Married  Human resources officer Violence: Not At Risk   Fear of Current or Ex-Partner: No   Emotionally Abused: No   Physically Abused: No   Sexually Abused: No   Current Outpatient Medications on File Prior to Visit  Medication Sig Dispense Refill   amLODipine (NORVASC) 10 MG tablet Take 1 tablet by mouth once daily 90 tablet 0   clopidogrel (PLAVIX) 75 MG tablet Take 1 tablet (75 mg total) by mouth daily. 90 tablet 1   famotidine (PEPCID) 20 MG tablet Take 20 mg by mouth daily.     glipiZIDE (GLUCOTROL XL) 5 MG 24 hr tablet Take 2 tablets (10 mg total) by mouth daily. 180 tablet 3   glucose blood (ONETOUCH VERIO) test strip USE 1 STRIP TO CHECK GLUCOSE 2x DAILY 200 each 3   hydroxyurea (HYDREA) 500 MG capsule Take 1 capsule by mouth once daily 90  capsule 0   Lancets (ONETOUCH DELICA PLUS ATFTDD22G) MISC USE 1  TO CHECK GLUCOSE TWICE DAILY 100 each 3   lisinopril (ZESTRIL) 40 MG tablet Take 1 tablet by mouth once daily 90 tablet 1   metoprolol succinate (TOPROL-XL) 25 MG 24 hr tablet Take 1 tablet by mouth once daily 90 tablet 1   pantoprazole (PROTONIX) 40 MG tablet Take 1 tablet by mouth once daily 90 tablet 1   rosuvastatin (CRESTOR) 40 MG tablet Take 1 tablet by mouth once daily 90 tablet 0   No current facility-administered medications on file prior to visit.   Allergies  Allergen Reactions   Penicillins Nausea And Vomiting    Has patient had a PCN reaction causing immediate rash, facial/tongue/throat swelling, SOB or lightheadedness with hypotension: no Has patient had a PCN reaction causing severe rash involving mucus membranes or skin necrosis: no Has patient had a PCN reaction that required hospitalization: no Has patient had a PCN reaction occurring within the last 10 years: no If all of the above answers are "NO", then may proceed with Cephalosporin use.    Family History  Problem Relation Age of Onset   Diabetes Mother        and on mother's side of family   Colon cancer Mother    Stroke Mother    Diabetes Father        and on father's side of family   Diabetes Sister    Diabetes Sister    Diabetes Brother    Heart disease Neg Hx    Rectal cancer Neg Hx    Stomach cancer Neg Hx    PE: BP 100/60 (BP Location: Left Arm, Patient Position: Sitting, Cuff Size: Normal)   Pulse 75   Ht 5\' 4"  (1.626 m)   Wt 125 lb (  56.7 kg)   SpO2 97%   BMI 21.46 kg/m  Body mass index is 21.46 kg/m. Wt Readings from Last 3 Encounters:  07/11/21 125 lb (56.7 kg)  06/26/21 124 lb 6.4 oz (56.4 kg)  05/15/21 126 lb 12.8 oz (57.5 kg)   Constitutional:  Normal weight, in NAD Eyes: PERRLA, EOMI, no exophthalmos ENT: moist mucous membranes, no thyromegaly, no cervical lymphadenopathy Cardiovascular: RRR, No MRG Respiratory: CTA  B Gastrointestinal: abdomen soft, NT, ND, BS+ Musculoskeletal: no deformities, strength intact in all 4 Skin: moist, warm, no rashes Neurological: no tremor with outstretched hands, DTR normal in all 4  ASSESSMENT: 1. DM2, non-insulin-dependent, uncontrolled, with complications - CVA 34/7425 - MAU  2. H/o Hyperthyroidism - 20 years ago - was on meds, cannot remember name  3. HL  PLAN:  1. Patient with history of uncontrolled type 2 diabetes with improved control in the last 3 years, on sulfonylurea only.  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  She sees nephrology (Dr. Johnney Ou) and did not have to start prednisone for this.  She had a CVA in 10/2020 but afterwards, she started to improve her diet and sugars started to improve. HbA1c at last visit was better, at 6.9%.  However, at that time, she was still drinking sweet tea and regular sodas and I again strongly advised her to stop.  Discussed about the negative effects on both diabetes and kidney function.  At that time, however, she was not having any low blood sugars and most of her blood sugars were fairly well controlled, with few mild hypoglycemic exceptions before meals.  Sugars in the morning were excellent.  We did not change her regimen. -At this visit, per review of her excellent blood sugar log, almost all blood sugars are at goal.  I do not feel that we need to change her regimen for now. - I suggested to:  Patient Instructions  Please continue: - Glipizide XL 10 mg before breakfast  NO SWEET TEA AND REGULAR SODAS!  Please return in 4 months with your sugar log.   - we checked her HbA1c: 7.9% (higher than before and higher than expected from log - possibly from hydroxyurea interference or low red blood cell count).  We will check a fructosamine level today. - advised to check sugars at different times of the day - 1x a day, rotating check times - advised for yearly  eye exams >> she is UTD - return to clinic in 4 months  2. H/o hyperthyroidism -No thyrotoxic complaints -Latest TSH was normal Lab Results  Component Value Date   TSH 0.37 11/02/2020  -will recheck TFTs today  3. HL -Reviewed latest lipid panel from 12/2020: LDL higher than our goal of less than 70, the rest of the fractions at goal: Lab Results  Component Value Date   CHOL 163 12/07/2020   HDL 56.70 12/07/2020   LDLCALC 88 12/07/2020   TRIG 94.0 12/07/2020   CHOLHDL 3 12/07/2020  -Continues Crestor 40 mg daily without side effects  Component     Latest Ref Rng & Units 07/11/2021  Hemoglobin A1C     4.0 - 5.6 % 7.9 (A)  TSH     0.35 - 5.50 uIU/mL 0.72  T4,Free(Direct)     0.60 - 1.60 ng/dL 1.04  Triiodothyronine,Free,Serum     2.3 - 4.2 pg/mL 2.6  Fructosamine     205 - 285 umol/L 463 (  H)  Thyroid tests are normal. HbA1c calculated from fructosamine is 9.5%, even higher than the directly measured HbA1c...  Philemon Kingdom, MD PhD [facility]

## 2021-07-15 LAB — FRUCTOSAMINE: Fructosamine: 463 umol/L — ABNORMAL HIGH (ref 205–285)

## 2021-07-23 ENCOUNTER — Other Ambulatory Visit: Payer: Self-pay | Admitting: Family Medicine

## 2021-07-23 DIAGNOSIS — E785 Hyperlipidemia, unspecified: Secondary | ICD-10-CM

## 2021-08-07 ENCOUNTER — Inpatient Hospital Stay: Payer: Medicare HMO | Admitting: Nurse Practitioner

## 2021-08-07 ENCOUNTER — Other Ambulatory Visit: Payer: Self-pay

## 2021-08-07 ENCOUNTER — Inpatient Hospital Stay: Payer: Medicare HMO

## 2021-08-07 ENCOUNTER — Inpatient Hospital Stay: Payer: Medicare HMO | Attending: Nurse Practitioner

## 2021-08-07 ENCOUNTER — Encounter: Payer: Self-pay | Admitting: Nurse Practitioner

## 2021-08-07 VITALS — BP 141/60 | HR 79 | Temp 97.8°F | Resp 20 | Ht 64.0 in | Wt 121.6 lb

## 2021-08-07 DIAGNOSIS — D471 Chronic myeloproliferative disease: Secondary | ICD-10-CM | POA: Diagnosis not present

## 2021-08-07 DIAGNOSIS — D45 Polycythemia vera: Secondary | ICD-10-CM | POA: Diagnosis not present

## 2021-08-07 DIAGNOSIS — I10 Essential (primary) hypertension: Secondary | ICD-10-CM | POA: Insufficient documentation

## 2021-08-07 DIAGNOSIS — D649 Anemia, unspecified: Secondary | ICD-10-CM | POA: Insufficient documentation

## 2021-08-07 DIAGNOSIS — E119 Type 2 diabetes mellitus without complications: Secondary | ICD-10-CM | POA: Insufficient documentation

## 2021-08-07 LAB — CMP (CANCER CENTER ONLY)
ALT: 13 U/L (ref 0–44)
AST: 14 U/L — ABNORMAL LOW (ref 15–41)
Albumin: 4.2 g/dL (ref 3.5–5.0)
Alkaline Phosphatase: 64 U/L (ref 38–126)
Anion gap: 8 (ref 5–15)
BUN: 34 mg/dL — ABNORMAL HIGH (ref 8–23)
CO2: 21 mmol/L — ABNORMAL LOW (ref 22–32)
Calcium: 10 mg/dL (ref 8.9–10.3)
Chloride: 111 mmol/L (ref 98–111)
Creatinine: 2.73 mg/dL — ABNORMAL HIGH (ref 0.44–1.00)
GFR, Estimated: 17 mL/min — ABNORMAL LOW (ref >60)
Glucose, Bld: 321 mg/dL — ABNORMAL HIGH (ref 70–99)
Potassium: 4.3 mmol/L (ref 3.5–5.1)
Sodium: 140 mmol/L (ref 135–145)
Total Bilirubin: 0.6 mg/dL (ref 0.3–1.2)
Total Protein: 6.7 g/dL (ref 6.5–8.1)

## 2021-08-07 LAB — CBC WITH DIFFERENTIAL (CANCER CENTER ONLY)
Abs Immature Granulocytes: 0.02 10*3/uL (ref 0.00–0.07)
Basophils Absolute: 0 10*3/uL (ref 0.0–0.1)
Basophils Relative: 1 %
Eosinophils Absolute: 0.1 10*3/uL (ref 0.0–0.5)
Eosinophils Relative: 1 %
HCT: 37.1 % (ref 36.0–46.0)
Hemoglobin: 12 g/dL (ref 12.0–15.0)
Immature Granulocytes: 0 %
Lymphocytes Relative: 23 %
Lymphs Abs: 1.1 10*3/uL (ref 0.7–4.0)
MCH: 33 pg (ref 26.0–34.0)
MCHC: 32.3 g/dL (ref 30.0–36.0)
MCV: 101.9 fL — ABNORMAL HIGH (ref 80.0–100.0)
Monocytes Absolute: 0.5 10*3/uL (ref 0.1–1.0)
Monocytes Relative: 10 %
Neutro Abs: 3.3 10*3/uL (ref 1.7–7.7)
Neutrophils Relative %: 65 %
Platelet Count: 411 10*3/uL — ABNORMAL HIGH (ref 150–400)
RBC: 3.64 MIL/uL — ABNORMAL LOW (ref 3.87–5.11)
RDW: 15.9 % — ABNORMAL HIGH (ref 11.5–15.5)
WBC Count: 5 10*3/uL (ref 4.0–10.5)
nRBC: 0 % (ref 0.0–0.2)

## 2021-08-07 NOTE — Progress Notes (Signed)
  [facility] OFFICE PROGRESS NOTE   Diagnosis: Polycythemia vera  INTERVAL HISTORY:   Ms. Tetzlaff returns as scheduled.  She continues hydroxyurea.  She feels well.  No interim illnesses or infections.  No bleeding.  No symptoms of thrombosis.  She denies nausea/vomiting.  No mouth sores.  No diarrhea.  No rash.  Objective:  Vital signs in last 24 hours:  Blood pressure (!) 141/60, pulse 79, temperature 97.8 F (36.6 C), temperature source Oral, resp. rate 20, height 5\' 4"  (1.626 m), weight 121 lb 9.6 oz (55.2 kg), SpO2 100 %.    HEENT: No thrush or ulcers. Resp: Lungs clear bilaterally. Cardio: Regular rate and rhythm. GI: Abdomen soft and nontender.  No hepatosplenomegaly. Vascular: No leg edema.  Calves soft and nontender. Skin: No rash.   Lab Results:  Lab Results  Component Value Date   WBC 5.0 08/07/2021   HGB 12.0 08/07/2021   HCT 37.1 08/07/2021   MCV 101.9 (H) 08/07/2021   PLT 411 (H) 08/07/2021   NEUTROABS 3.3 08/07/2021    Imaging:  No results found.  Medications: I have reviewed the patient's current medications.  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 Mires appears stable.  Hemoglobin and platelet count remain in goal range.  She will continue hydroxyurea at the current dose of 500 mg Monday through Thursday, off on Friday, Saturday and Sunday.  She will  return for lab and follow-up in 6 weeks.    Ned Card ANP/GNP-BC   08/07/2021  1:16 PM

## 2021-08-08 ENCOUNTER — Telehealth: Payer: Self-pay | Admitting: *Deleted

## 2021-08-08 NOTE — Telephone Encounter (Signed)
Called patient to report her glucose elevation with lab on 12/6. She reports that she checks her glucose daily and ranges 88-150's. Instructed her to call PCP for consistent elevations. Renal functions remain at stable elevation and she sees Dr. Jannifer Hick at [facility].  Will route recent labs to Dr. Johnney Ou today.

## 2021-08-08 NOTE — Telephone Encounter (Signed)
-----   Message from Owens Shark, NP sent at 08/08/2021 10:14 AM EST ----- Please let her know the blood sugar was elevated at 321 on labs yesterday.  Is she monitoring at home?  She should follow-up with her PCP for persistent high readings.  Also, creatinine with stable elevation.  If she followed by nephrology?

## 2021-08-13 ENCOUNTER — Other Ambulatory Visit (HOSPITAL_COMMUNITY)
Admission: RE | Admit: 2021-08-13 | Discharge: 2021-08-13 | Disposition: A | Discharge status: Home / Self Care | Payer: Medicare HMO | Source: Ambulatory Visit | Attending: Family Medicine | Admitting: Family Medicine

## 2021-08-13 ENCOUNTER — Ambulatory Visit (INDEPENDENT_AMBULATORY_CARE_PROVIDER_SITE_OTHER): Payer: Medicare HMO | Admitting: Family Medicine

## 2021-08-13 ENCOUNTER — Encounter: Payer: Self-pay | Admitting: Family Medicine

## 2021-08-13 VITALS — BP 118/78 | HR 72 | Temp 98.2°F | Resp 18 | Ht 64.0 in | Wt 122.6 lb

## 2021-08-13 DIAGNOSIS — E7800 Pure hypercholesterolemia, unspecified: Secondary | ICD-10-CM

## 2021-08-13 DIAGNOSIS — N898 Other specified noninflammatory disorders of vagina: Secondary | ICD-10-CM

## 2021-08-13 DIAGNOSIS — D45 Polycythemia vera: Secondary | ICD-10-CM

## 2021-08-13 DIAGNOSIS — R109 Unspecified abdominal pain: Secondary | ICD-10-CM | POA: Insufficient documentation

## 2021-08-13 DIAGNOSIS — E785 Hyperlipidemia, unspecified: Secondary | ICD-10-CM

## 2021-08-13 DIAGNOSIS — I152 Hypertension secondary to endocrine disorders: Secondary | ICD-10-CM | POA: Diagnosis not present

## 2021-08-13 DIAGNOSIS — I10 Essential (primary) hypertension: Secondary | ICD-10-CM

## 2021-08-13 DIAGNOSIS — K219 Gastro-esophageal reflux disease without esophagitis: Secondary | ICD-10-CM

## 2021-08-13 DIAGNOSIS — D469 Myelodysplastic syndrome, unspecified: Secondary | ICD-10-CM | POA: Diagnosis not present

## 2021-08-13 DIAGNOSIS — E1165 Type 2 diabetes mellitus with hyperglycemia: Secondary | ICD-10-CM

## 2021-08-13 DIAGNOSIS — E119 Type 2 diabetes mellitus without complications: Secondary | ICD-10-CM | POA: Diagnosis not present

## 2021-08-13 DIAGNOSIS — E1169 Type 2 diabetes mellitus with other specified complication: Secondary | ICD-10-CM | POA: Diagnosis not present

## 2021-08-13 DIAGNOSIS — R319 Hematuria, unspecified: Secondary | ICD-10-CM

## 2021-08-13 DIAGNOSIS — E1159 Type 2 diabetes mellitus with other circulatory complications: Secondary | ICD-10-CM | POA: Diagnosis not present

## 2021-08-13 DIAGNOSIS — Z0000 Encounter for general adult medical examination without abnormal findings: Secondary | ICD-10-CM

## 2021-08-13 DIAGNOSIS — E43 Unspecified severe protein-calorie malnutrition: Secondary | ICD-10-CM | POA: Diagnosis not present

## 2021-08-13 LAB — COMPREHENSIVE METABOLIC PANEL
ALT: 12 U/L (ref 0–35)
AST: 12 U/L (ref 0–37)
Albumin: 4 g/dL (ref 3.5–5.2)
Alkaline Phosphatase: 66 U/L (ref 39–117)
BUN: 32 mg/dL — ABNORMAL HIGH (ref 6–23)
CO2: 21 mEq/L (ref 19–32)
Calcium: 9.8 mg/dL (ref 8.4–10.5)
Chloride: 114 mEq/L — ABNORMAL HIGH (ref 96–112)
Creatinine, Ser: 2.41 mg/dL — ABNORMAL HIGH (ref 0.40–1.20)
GFR: 18.6 mL/min — ABNORMAL LOW (ref >60.00)
Glucose, Bld: 132 mg/dL — ABNORMAL HIGH (ref 70–99)
Potassium: 4.5 mEq/L (ref 3.5–5.1)
Sodium: 145 mEq/L (ref 135–145)
Total Bilirubin: 0.6 mg/dL (ref 0.2–1.2)
Total Protein: 6.6 g/dL (ref 6.0–8.3)

## 2021-08-13 LAB — POC URINALSYSI DIPSTICK (AUTOMATED)
Bilirubin, UA: NEGATIVE
Glucose, UA: NEGATIVE
Ketones, UA: NEGATIVE
Leukocytes, UA: NEGATIVE
Nitrite, UA: NEGATIVE
Protein, UA: POSITIVE — AB
Spec Grav, UA: 1.02 (ref 1.010–1.025)
Urobilinogen, UA: 0.2 E.U./dL
pH, UA: 6 (ref 5.0–8.0)

## 2021-08-13 LAB — CBC WITH DIFFERENTIAL/PLATELET
Basophils Absolute: 0 10*3/uL (ref 0.0–0.1)
Basophils Relative: 0.5 % (ref 0.0–3.0)
Eosinophils Absolute: 0 10*3/uL (ref 0.0–0.7)
Eosinophils Relative: 0.8 % (ref 0.0–5.0)
HCT: 35.7 % — ABNORMAL LOW (ref 36.0–46.0)
Hemoglobin: 11.7 g/dL — ABNORMAL LOW (ref 12.0–15.0)
Lymphocytes Relative: 26.2 % (ref 12.0–46.0)
Lymphs Abs: 1.4 10*3/uL (ref 0.7–4.0)
MCHC: 32.7 g/dL (ref 30.0–36.0)
MCV: 102 fl — ABNORMAL HIGH (ref 78.0–100.0)
Monocytes Absolute: 0.5 10*3/uL (ref 0.1–1.0)
Monocytes Relative: 9.1 % (ref 3.0–12.0)
Neutro Abs: 3.4 10*3/uL (ref 1.4–7.7)
Neutrophils Relative %: 63.4 % (ref 43.0–77.0)
Platelets: 449 10*3/uL — ABNORMAL HIGH (ref 150.0–400.0)
RBC: 3.5 Mil/uL — ABNORMAL LOW (ref 3.87–5.11)
RDW: 17.6 % — ABNORMAL HIGH (ref 11.5–15.5)
WBC: 5.4 10*3/uL (ref 4.0–10.5)

## 2021-08-13 LAB — LIPID PANEL
Cholesterol: 217 mg/dL — ABNORMAL HIGH (ref 0–200)
HDL: 59.1 mg/dL (ref >39.00)
LDL Cholesterol: 140 mg/dL — ABNORMAL HIGH (ref 0–99)
NonHDL: 157.9
Total CHOL/HDL Ratio: 4
Triglycerides: 88 mg/dL (ref 0.0–149.0)
VLDL: 17.6 mg/dL (ref 0.0–40.0)

## 2021-08-13 MED ORDER — PANTOPRAZOLE SODIUM 40 MG PO TBEC: 40.0000 mg | DELAYED_RELEASE_TABLET | Freq: Every day | ORAL | 3 refills | Status: DC

## 2021-08-13 NOTE — Assessment & Plan Note (Signed)
Pt refused pneum  covid shingles vaccine Pt did not want info about living will  Pt no longer does mammograms or bone density

## 2021-08-13 NOTE — Assessment & Plan Note (Signed)
Self swab done  Check urine

## 2021-08-13 NOTE — Assessment & Plan Note (Signed)
Tolerating statin, encouraged heart healthy diet, avoid trans fats, minimize simple carbs and saturated fats. Increase exercise as tolerated 

## 2021-08-13 NOTE — Assessment & Plan Note (Signed)
.  per endocrinology

## 2021-08-13 NOTE — Patient Instructions (Signed)
Preventive Care 40 Years and Older, Female Preventive care refers to lifestyle choices and visits with your health care provider that can promote health and wellness. Preventive care visits are also called wellness exams. What can I expect for my preventive care visit? Counseling Your health care provider may ask you questions about your: Medical history, including: Past medical problems. Family medical history. Pregnancy and menstrual history. History of falls. Current health, including: Memory and ability to understand (cognition). Emotional well-being. Home life and relationship well-being. Sexual activity and sexual health. Lifestyle, including: Alcohol, nicotine or tobacco, and drug use. Access to firearms. Diet, exercise, and sleep habits. Work and work Statistician. Sunscreen use. Safety issues such as seatbelt and bike helmet use. Physical exam Your health care provider will check your: Height and weight. These may be used to calculate your BMI (body mass index). BMI is a measurement that tells if you are at a healthy weight. Waist circumference. This measures the distance around your waistline. This measurement also tells if you are at a healthy weight and may help predict your risk of certain diseases, such as type 2 diabetes and high blood pressure. Heart rate and blood pressure. Body temperature. Skin for abnormal spots. What immunizations do I need? Vaccines are usually given at various ages, according to a schedule. Your health care provider will recommend vaccines for you based on your age, medical history, and lifestyle or other factors, such as travel or where you work. What tests do I need? Screening Your health care provider may recommend screening tests for certain conditions. This may include: Lipid and cholesterol levels. Hepatitis C test. Hepatitis B test. HIV (human immunodeficiency virus) test. STI (sexually transmitted infection) testing, if you are at  risk. Lung cancer screening. Colorectal cancer screening. Diabetes screening. This is done by checking your blood sugar (glucose) after you have not eaten for a while (fasting). Mammogram. Talk with your health care provider about how often you should have regular mammograms. BRCA-related cancer screening. This may be done if you have a family history of breast, ovarian, tubal, or peritoneal cancers. Bone density scan. This is done to screen for osteoporosis. Talk with your health care provider about your test results, treatment options, and if necessary, the need for more tests. Follow these instructions at home: Eating and drinking  Eat a diet that includes fresh fruits and vegetables, whole grains, lean protein, and low-fat dairy products. Limit your intake of foods with high amounts of sugar, saturated fats, and salt. Take vitamin and mineral supplements as recommended by your health care provider. Do not drink alcohol if your health care provider tells you not to drink. If you drink alcohol: Limit how much you have to 0-1 drink a day. Know how much alcohol is in your drink. In the [place] one drink equals one 12 oz bottle of beer (355 mL), one 5 oz glass of wine (148 mL), or one 1 oz glass of hard liquor (44 mL). Lifestyle Brush your teeth every morning and night with fluoride toothpaste. Floss one time each day. Exercise for at least 30 minutes 5 or more days each week. Do not use any products that contain nicotine or tobacco. These products include cigarettes, chewing tobacco, and vaping devices, such as e-cigarettes. If you need help quitting, ask your health care provider. Do not use drugs. If you are sexually active, practice safe sex. Use a condom or other form of protection in order to prevent STIs. Take aspirin only as told by your  health care provider. Make sure that you understand how much to take and what form to take. Work with your health care provider to find out whether it  is safe and beneficial for you to take aspirin daily. Ask your health care provider if you need to take a cholesterol-lowering medicine (statin). Find healthy ways to manage stress, such as: Meditation, yoga, or listening to music. Journaling. Talking to a trusted person. Spending time with friends and family. Minimize exposure to UV radiation to reduce your risk of skin cancer. Safety Always wear your seat belt while driving or riding in a vehicle. Do not drive: If you have been drinking alcohol. Do not ride with someone who has been drinking. When you are tired or distracted. While texting. If you have been using any mind-altering substances or drugs. Wear a helmet and other protective equipment during sports activities. If you have firearms in your house, make sure you follow all gun safety procedures. What's next? Visit your health care provider once a year for an annual wellness visit. Ask your health care provider how often you should have your eyes and teeth checked. Stay up to date on all vaccines. This information is not intended to replace advice given to you by your health care provider. Make sure you discuss any questions you have with your health care provider. Document Revised: 02/14/2021 Document Reviewed: 02/14/2021 Elsevier Patient Education  [place].

## 2021-08-13 NOTE — Assessment & Plan Note (Signed)
Per hematology 

## 2021-08-13 NOTE — Assessment & Plan Note (Signed)
Well controlled, no changes to meds. Encouraged heart healthy diet such as the DASH diet and exercise as tolerated.  °

## 2021-08-13 NOTE — Assessment & Plan Note (Signed)
Check labs and urine

## 2021-08-13 NOTE — Progress Notes (Addendum)
Subjective:   By signing my name below, I, Brandi Mccormick, attest that this documentation has been prepared under the direction and in the presence of [name], DO. 08/13/2021   Patient ID: Brandi Mccormick, female    DOB: 1941-05-09, 80 y.o.   MRN: [id number]  Chief Complaint  Patient presents with   Annual Exam    Pt states fasting     HPI Patient is in today for a comprehensive physical exam.  She reports she is doing well.  She reports she has been feeling uncomfortable in her stomach that is worsened when she lies down. Feels like there is a pressure on it. She also mentions a brownish vaginal discharge, no odor. Denies dysuria.   She checks her sugar levels at home and they have been stable. Last morning, it read 120. She is still seeing Dr Cruzita Lederer to manage her diabetes.   Requesting a refill on 40 mg Protonix.  She denies fever, hearing loss, ear pain,congestion, sinus pain, sore throat, eye pain, chest pain, palpitations, cough, shortness of breath, wheezing, nausea. vomiting, diarrhea, constipation, blood in stool, dysuria,frequency, hematuria and headaches.   She tries to exercise by walking in her backyard.  She has 3 Covid-19 vaccines at this time and will get the booster vaccine. She does not want the pneumonia vaccine today.   She will see an ophthalmologist beginning of next year. She has a dental appointment tomorrow.   There has been no recent changes in family history. No recent surgeries. She is not interested in setting up an advance directive.   Past Medical History:  Diagnosis Date   CVA (cerebral vascular accident) ([place]) 10/2020   Diabetes ([place])    GERD (gastroesophageal reflux disease)    Hyperlipidemia    Hypertension    Hyperthyroidism    Pneumonia    Shingles     Past Surgical History:  Procedure Laterality Date   CATARACT EXTRACTION Left 2020   COLONOSCOPY     DENTAL SURGERY     IR ANGIO INTRA EXTRACRAN SEL COM CAROTID  INNOMINATE BILAT MOD SED  10/09/2020   IR ANGIO VERTEBRAL SEL VERTEBRAL BILAT MOD SED  10/09/2020    Family History  Problem Relation Age of Onset   Diabetes Mother        and on mother's side of family   Colon cancer Mother    Stroke Mother    Diabetes Father        and on father's side of family   Diabetes Sister    Diabetes Sister    Diabetes Brother    Heart disease Neg Hx    Rectal cancer Neg Hx    Stomach cancer Neg Hx     Social History   Socioeconomic History   Marital status: Married    Spouse name: Not on file   Number of children: Not on file   Years of education: Not on file   Highest education level: Not on file  Occupational History   Occupation: [employer]--    Comment: retired  Tobacco Use   Smoking status: Never   Smokeless tobacco: Never  Vaping Use   Vaping Use: Never used  Substance and Sexual Activity   Alcohol use: Yes    Alcohol/week: 0.0 standard drinks    Comment: occasional wine cooler   Drug use: No   Sexual activity: Not Currently    Partners: Male  Other Topics Concern   Not on file  Social History  Narrative   Exercise-- yard work,  Walking in [employer]   03/15/21 lives with husband   Social Determinants of Radio broadcast assistant Strain: Low Risk    Difficulty of Paying Living Expenses: Not hard at all  Food Insecurity: No Food Insecurity   Worried About Charity fundraiser in the Last Year: Never true   Arboriculturist in the Last Year: Never true  Transportation Needs: No Transportation Needs   Lack of Transportation (Medical): No   Lack of Transportation (Non-Medical): No  Physical Activity: Insufficiently Active   Days of Exercise per Week: 3 days   Minutes of Exercise per Session: 30 min  Stress: No Stress Concern Present   Feeling of Stress : Not at all  Social Connections: Moderately Isolated   Frequency of Communication with Friends and Family: More than three times a week   Frequency of Social Gatherings with Friends and  Family: More than three times a week   Attends Religious Services: Never   Marine scientist or Organizations: No   Attends Music therapist: Never   Marital Status: Married  Human resources officer Violence: Not At Risk   Fear of Current or Ex-Partner: No   Emotionally Abused: No   Physically Abused: No   Sexually Abused: No    Outpatient Medications Prior to Visit  Medication Sig Dispense Refill   amLODipine (NORVASC) 10 MG tablet Take 1 tablet by mouth once daily 90 tablet 0   clopidogrel (PLAVIX) 75 MG tablet Take 1 tablet (75 mg total) by mouth daily. 90 tablet 1   famotidine (PEPCID) 20 MG tablet Take 20 mg by mouth daily.     glipiZIDE (GLUCOTROL XL) 5 MG 24 hr tablet Take 2 tablets (10 mg total) by mouth daily. 180 tablet 3   glucose blood (ONETOUCH VERIO) test strip USE 1 STRIP TO CHECK GLUCOSE 2x DAILY 200 each 3   hydroxyurea (HYDREA) 500 MG capsule Take 1 capsule by mouth once daily 90 capsule 0   Lancets (ONETOUCH DELICA PLUS [id number]) MISC USE 1  TO CHECK GLUCOSE TWICE DAILY 100 each 3   lisinopril (ZESTRIL) 40 MG tablet Take 1 tablet by mouth once daily 90 tablet 1   metoprolol succinate (TOPROL-XL) 25 MG 24 hr tablet Take 1 tablet by mouth once daily 90 tablet 1   rosuvastatin (CRESTOR) 40 MG tablet Take 1 tablet by mouth once daily 90 tablet 1   pantoprazole (PROTONIX) 40 MG tablet Take 1 tablet by mouth once daily 90 tablet 1   No facility-administered medications prior to visit.    Allergies  Allergen Reactions   Penicillins Nausea And Vomiting    Has patient had a PCN reaction causing immediate rash, facial/tongue/throat swelling, SOB or lightheadedness with hypotension: no Has patient had a PCN reaction causing severe rash involving mucus membranes or skin necrosis: no Has patient had a PCN reaction that required hospitalization: no Has patient had a PCN reaction occurring within the last 10 years: no If all of the above answers are "NO", then may  proceed with Cephalosporin use.     Review of Systems  Constitutional:  Negative for fever.  HENT:  Negative for congestion, ear pain, hearing loss, sinus pain and sore throat.   Eyes:  Negative for blurred vision and pain.  Respiratory:  Negative for cough, sputum production, shortness of breath and wheezing.   Cardiovascular:  Negative for chest pain and palpitations.  Gastrointestinal:  Positive for abdominal  pain. Negative for blood in stool, constipation, diarrhea, nausea and vomiting.  Genitourinary:  Negative for dysuria, frequency, hematuria and urgency.       (+) vaginal discharge   Musculoskeletal:  Negative for back pain, falls and myalgias.  Neurological:  Negative for dizziness, sensory change, loss of consciousness, weakness and headaches.  Endo/Heme/Allergies:  Negative for environmental allergies. Does not bruise/bleed easily.  Psychiatric/Behavioral:  Negative for depression and suicidal ideas. The patient is not nervous/anxious and does not have insomnia.       Objective:    Physical Exam Constitutional:      General: She is not in acute distress.    Appearance: Normal appearance. She is not ill-appearing.  HENT:     Head: Normocephalic and atraumatic.     Right Ear: External ear normal.     Left Ear: External ear normal.  Eyes:     Extraocular Movements: Extraocular movements intact.     Pupils: Pupils are equal, round, and reactive to light.  Cardiovascular:     Rate and Rhythm: Normal rate and regular rhythm.     Pulses: Normal pulses.     Heart sounds: Normal heart sounds. No murmur heard.   No gallop.  Pulmonary:     Effort: Pulmonary effort is normal. No respiratory distress.     Breath sounds: Normal breath sounds. No wheezing, rhonchi or rales.  Abdominal:     General: Bowel sounds are normal. There is no distension.     Palpations: Abdomen is soft. There is no mass.     Tenderness: There is no abdominal tenderness. There is no guarding or rebound.      Hernia: No hernia is present.  Musculoskeletal:     Cervical back: Normal range of motion and neck supple.  Lymphadenopathy:     Cervical: No cervical adenopathy.  Skin:    General: Skin is warm and dry.  Neurological:     Mental Status: She is alert and oriented to person, place, and time.  Psychiatric:        Behavior: Behavior normal.    BP 118/78 (BP Location: Left Arm, Patient Position: Sitting, Cuff Size: Normal)   Pulse 72   Temp 98.2 F (36.8 C) (Oral)   Resp 18   Ht 5\' 4"  (1.626 m)   Wt 122 lb 9.6 oz (55.6 kg)   SpO2 100%   BMI 21.04 kg/m  Wt Readings from Last 3 Encounters:  08/13/21 122 lb 9.6 oz (55.6 kg)  08/07/21 121 lb 9.6 oz (55.2 kg)  07/11/21 125 lb (56.7 kg)    Diabetic Foot Exam - Simple   Simple Foot Form Diabetic Foot exam was performed with the following findings: Yes 08/13/2021 10:06 AM  Visual Inspection No deformities, no ulcerations, no other skin breakdown bilaterally: Yes Sensation Testing Intact to touch and monofilament testing bilaterally: Yes Pulse Check Posterior Tibialis and Dorsalis pulse intact bilaterally: Yes Comments    Lab Results  Component Value Date   WBC 5.0 08/07/2021   HGB 12.0 08/07/2021   HCT 37.1 08/07/2021   PLT 411 (H) 08/07/2021   GLUCOSE 321 (H) 08/07/2021   CHOL 163 12/07/2020   TRIG 94.0 12/07/2020   HDL 56.70 12/07/2020   LDLCALC 88 12/07/2020   ALT 13 08/07/2021   AST 14 (L) 08/07/2021   NA 140 08/07/2021   K 4.3 08/07/2021   CL 111 08/07/2021   CREATININE 2.73 (H) 08/07/2021   BUN 34 (H) 08/07/2021   CO2 21 (L)  08/07/2021   TSH 0.72 07/11/2021   INR 1.0 10/09/2020   HGBA1C 7.9 (A) 07/11/2021   MICROALBUR 46.2 (H) 11/04/2019    Lab Results  Component Value Date   TSH 0.72 07/11/2021   Lab Results  Component Value Date   WBC 5.0 08/07/2021   HGB 12.0 08/07/2021   HCT 37.1 08/07/2021   MCV 101.9 (H) 08/07/2021   PLT 411 (H) 08/07/2021   Lab Results  Component Value Date   NA  140 08/07/2021   K 4.3 08/07/2021   CO2 21 (L) 08/07/2021   GLUCOSE 321 (H) 08/07/2021   BUN 34 (H) 08/07/2021   CREATININE 2.73 (H) 08/07/2021   BILITOT 0.6 08/07/2021   ALKPHOS 64 08/07/2021   AST 14 (L) 08/07/2021   ALT 13 08/07/2021   PROT 6.7 08/07/2021   ALBUMIN 4.2 08/07/2021   CALCIUM 10.0 08/07/2021   ANIONGAP 8 08/07/2021   GFR 16.68 (L) 12/07/2020   Lab Results  Component Value Date   CHOL 163 12/07/2020   Lab Results  Component Value Date   HDL 56.70 12/07/2020   Lab Results  Component Value Date   LDLCALC 88 12/07/2020   Lab Results  Component Value Date   TRIG 94.0 12/07/2020   Lab Results  Component Value Date   CHOLHDL 3 12/07/2020   Lab Results  Component Value Date   HGBA1C 7.9 (A) 07/11/2021        Colonoscopy: Last completed on 02/05/2017. There were internal hemorrhoids. Polyps were found in the cecum and sigmoid colon and removed with a cold snare. Otherwise exam was normal. Repeat in 5 years. Mammogram: Last checked on 04/21/2017. Results were normal. Repeat in 1 year. DUE   Assessment & Plan:   Problem List Items Addressed This Visit       Unprioritized   Hyperlipidemia associated with type 2 diabetes mellitus ([place])   Relevant Orders   Comprehensive metabolic panel   Lipid panel   Abdominal pressure    Check labs and urine       Relevant Orders   POCT Urinalysis Dipstick (Automated) (Completed)   Essential hypertension    Well controlled, no changes to meds. Encouraged heart healthy diet such as the DASH diet and exercise as tolerated.       Hyperlipidemia    Tolerating statin, encouraged heart healthy diet, avoid trans fats, minimize simple carbs and saturated fats. Increase exercise as tolerated      Myelodysplastic syndrome, unspecified ([place])   Polycythemia vera ([place])    Per hematology      Preventative health care    Pt refused pneum  covid shingles vaccine Pt did not want info about living will  Pt no longer  does mammograms or bone density      Uncontrolled type 2 diabetes mellitus with hyperglycemia ([place])    .per endocrinology      Vaginal discharge    Self swab done  Check urine      Relevant Orders   Cervicovaginal ancillary only( [place])   Other Visit Diagnoses     Hypertension associated with diabetes ([place])    -  Primary   Relevant Orders   Comprehensive metabolic panel   Lipid panel   Diabetes mellitus type II, non insulin dependent ([place])       Relevant Orders   CBC with Differential/Platelet   Comprehensive metabolic panel   Lipid panel   Gastroesophageal reflux disease, unspecified whether esophagitis present  Relevant Medications   pantoprazole (PROTONIX) 40 MG tablet   Unspecified severe protein-calorie malnutrition ([facility])   (Chronic)     Hematuria, unspecified type       Relevant Orders   Urine Culture        Meds ordered this encounter  Medications   pantoprazole (PROTONIX) 40 MG tablet    Sig: Take 1 tablet (40 mg total) by mouth daily.    Dispense:  90 tablet    Refill:  3    I,Brandi Mccormick,acting as a Education administrator for [employer], DO.,have documented all relevant documentation on the behalf of [name], DO,as directed by  [name], DO while in the presence of [name], DO.   I, [name], DO., personally preformed the services described in this documentation.  All medical record entries made by the scribe were at my direction and in my presence.  I have reviewed the chart and discharge instructions (if applicable) and agree that the record reflects my personal performance and is accurate and complete. 08/13/2021

## 2021-08-14 LAB — CERVICOVAGINAL ANCILLARY ONLY
Bacterial Vaginitis (gardnerella): NEGATIVE
Candida Glabrata: POSITIVE — AB
Candida Vaginitis: NEGATIVE
Chlamydia: NEGATIVE
Comment: NEGATIVE
Comment: NEGATIVE
Comment: NEGATIVE
Comment: NEGATIVE
Comment: NEGATIVE
Comment: NORMAL
Neisseria Gonorrhea: NEGATIVE
Trichomonas: NEGATIVE

## 2021-08-14 LAB — URINE CULTURE
MICRO NUMBER:: 12744342
Result:: NO GROWTH
SPECIMEN QUALITY:: ADEQUATE

## 2021-08-20 ENCOUNTER — Telehealth: Payer: Self-pay | Admitting: Family Medicine

## 2021-08-20 DIAGNOSIS — B3731 Acute candidiasis of vulva and vagina: Secondary | ICD-10-CM

## 2021-08-20 MED ORDER — FLUCONAZOLE 150 MG PO TABS
ORAL_TABLET | ORAL | 0 refills | Status: DC
Start: 2021-08-20 — End: 2021-09-04

## 2021-08-20 NOTE — Telephone Encounter (Signed)
Patient states she was supposed to get a mediation sent over to her pharmacy after her 12/12  office visit but the pharmacy still has no received it. Medication was supposed to be for an infection. Please advice.    [facility] ([address]), [place]  [address] ([place]) [place] [address]  Phone:  [phone]  Fax:  [phone]

## 2021-08-20 NOTE — Telephone Encounter (Signed)
Spoke with patient, advised prescription has been sent to pharmacy.

## 2021-09-04 ENCOUNTER — Other Ambulatory Visit: Payer: Self-pay | Admitting: Family Medicine

## 2021-09-04 DIAGNOSIS — B3731 Acute candidiasis of vulva and vagina: Secondary | ICD-10-CM

## 2021-09-08 ENCOUNTER — Other Ambulatory Visit: Payer: Self-pay | Admitting: Adult Health

## 2021-09-18 ENCOUNTER — Encounter: Payer: Self-pay | Admitting: Oncology

## 2021-09-18 ENCOUNTER — Inpatient Hospital Stay: Payer: Medicare HMO | Admitting: Oncology

## 2021-09-18 ENCOUNTER — Other Ambulatory Visit: Payer: Self-pay

## 2021-09-18 ENCOUNTER — Inpatient Hospital Stay: Payer: Medicare HMO | Attending: Nurse Practitioner

## 2021-09-18 VITALS — BP 128/62 | HR 78 | Temp 98.7°F | Resp 20 | Ht 64.0 in | Wt 123.6 lb

## 2021-09-18 DIAGNOSIS — E119 Type 2 diabetes mellitus without complications: Secondary | ICD-10-CM | POA: Insufficient documentation

## 2021-09-18 DIAGNOSIS — D471 Chronic myeloproliferative disease: Secondary | ICD-10-CM

## 2021-09-18 DIAGNOSIS — I10 Essential (primary) hypertension: Secondary | ICD-10-CM | POA: Diagnosis not present

## 2021-09-18 DIAGNOSIS — D45 Polycythemia vera: Secondary | ICD-10-CM | POA: Diagnosis not present

## 2021-09-18 LAB — CBC WITH DIFFERENTIAL (CANCER CENTER ONLY)
Abs Immature Granulocytes: 0.03 10*3/uL (ref 0.00–0.07)
Basophils Absolute: 0 10*3/uL (ref 0.0–0.1)
Basophils Relative: 0 %
Eosinophils Absolute: 0 10*3/uL (ref 0.0–0.5)
Eosinophils Relative: 1 %
HCT: 35.1 % — ABNORMAL LOW (ref 36.0–46.0)
Hemoglobin: 11.5 g/dL — ABNORMAL LOW (ref 12.0–15.0)
Immature Granulocytes: 1 %
Lymphocytes Relative: 26 %
Lymphs Abs: 1.3 10*3/uL (ref 0.7–4.0)
MCH: 33.3 pg (ref 26.0–34.0)
MCHC: 32.8 g/dL (ref 30.0–36.0)
MCV: 101.7 fL — ABNORMAL HIGH (ref 80.0–100.0)
Monocytes Absolute: 0.5 10*3/uL (ref 0.1–1.0)
Monocytes Relative: 9 %
Neutro Abs: 3.1 10*3/uL (ref 1.7–7.7)
Neutrophils Relative %: 63 %
Platelet Count: 404 10*3/uL — ABNORMAL HIGH (ref 150–400)
RBC: 3.45 MIL/uL — ABNORMAL LOW (ref 3.87–5.11)
RDW: 15.4 % (ref 11.5–15.5)
WBC Count: 4.9 10*3/uL (ref 4.0–10.5)
nRBC: 0 % (ref 0.0–0.2)

## 2021-09-18 LAB — CMP (CANCER CENTER ONLY)
ALT: 11 U/L (ref 0–44)
AST: 13 U/L — ABNORMAL LOW (ref 15–41)
Albumin: 3.9 g/dL (ref 3.5–5.0)
Alkaline Phosphatase: 62 U/L (ref 38–126)
Anion gap: 9 (ref 5–15)
BUN: 27 mg/dL — ABNORMAL HIGH (ref 8–23)
CO2: 23 mmol/L (ref 22–32)
Calcium: 9.4 mg/dL (ref 8.9–10.3)
Chloride: 107 mmol/L (ref 98–111)
Creatinine: 2.23 mg/dL — ABNORMAL HIGH (ref 0.44–1.00)
GFR, Estimated: 22 mL/min — ABNORMAL LOW (ref >60)
Glucose, Bld: 309 mg/dL — ABNORMAL HIGH (ref 70–99)
Potassium: 4.4 mmol/L (ref 3.5–5.1)
Sodium: 139 mmol/L (ref 135–145)
Total Bilirubin: 0.4 mg/dL (ref 0.3–1.2)
Total Protein: 6.6 g/dL (ref 6.5–8.1)

## 2021-09-18 NOTE — Progress Notes (Signed)
°  Brandi Mccormick OFFICE PROGRESS NOTE   Diagnosis: Polycythemia vera  INTERVAL HISTORY:   Brandi Mccormick returns as scheduled.  She continues hydroxyurea.  No symptom of thrombosis or recurrent stroke.  No bleeding.  No complaint.  Objective:  Vital signs in last 24 hours:  Blood pressure 128/62, pulse 78, temperature 98.7 F (37.1 C), temperature source Oral, resp. rate 20, height 5\' 4"  (1.626 m), weight 123 lb 9.6 oz (56.1 kg), SpO2 100 %.    HEENT: No thrush or ulcers Resp: Lungs clear bilaterally Cardio: Regular rate and rhythm GI: No hepatosplenomegaly Vascular: No leg edema   Lab Results:  Lab Results  Component Value Date   WBC 4.9 09/18/2021   HGB 11.5 (L) 09/18/2021   HCT 35.1 (L) 09/18/2021   MCV 101.7 (H) 09/18/2021   PLT 404 (H) 09/18/2021   NEUTROABS 3.1 09/18/2021    CMP  Lab Results  Component Value Date   NA 139 09/18/2021   K 4.4 09/18/2021   CL 107 09/18/2021   CO2 23 09/18/2021   GLUCOSE 309 (H) 09/18/2021   BUN 27 (H) 09/18/2021   CREATININE 2.23 (H) 09/18/2021   CALCIUM 9.4 09/18/2021   PROT 6.6 09/18/2021   ALBUMIN 3.9 09/18/2021   AST 13 (L) 09/18/2021   ALT 11 09/18/2021   ALKPHOS 62 09/18/2021   BILITOT 0.4 09/18/2021   GFRNONAA 22 (L) 09/18/2021   GFRAA 24 (L) 06/10/2019    Medications: I have reviewed the patient's current medications.   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  The platelet count and hemoglobin remain in the goal range.  She will continue hydroxyurea at the current dose.  She will return for a lab visit in 6 weeks and an office visit in 3 months.  Betsy Coder, MD  09/18/2021  12:30 PM

## 2021-09-24 ENCOUNTER — Other Ambulatory Visit: Payer: Self-pay

## 2021-09-24 ENCOUNTER — Ambulatory Visit: Payer: Medicare HMO | Admitting: Adult Health

## 2021-09-24 ENCOUNTER — Encounter: Payer: Self-pay | Admitting: Adult Health

## 2021-09-24 VITALS — BP 127/63 | HR 73 | Ht 64.0 in | Wt 121.0 lb

## 2021-09-24 DIAGNOSIS — I651 Occlusion and stenosis of basilar artery: Secondary | ICD-10-CM

## 2021-09-24 DIAGNOSIS — I6302 Cerebral infarction due to thrombosis of basilar artery: Secondary | ICD-10-CM | POA: Diagnosis not present

## 2021-09-24 DIAGNOSIS — E785 Hyperlipidemia, unspecified: Secondary | ICD-10-CM

## 2021-09-24 NOTE — Patient Instructions (Signed)
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   Signs of a Stroke? Follow the BEFAST method:  Balance Watch for a sudden loss of balance, trouble with coordination or vertigo Eyes Is there a sudden loss of vision in one or both eyes? Or double vision?  Face: Ask the person to smile. Does one side of the face droop or is it numb?  Arms: Ask the person to raise both arms. Does one arm drift downward? Is there weakness or numbness of a leg? Speech: Ask the person to repeat a simple phrase. Does the speech sound slurred/strange? Is the person confused ? Time: If you observe any of these signs, call 911.        Thank you for coming to see [place] at [facility]. I hope we have been able to provide you high quality care today.  You may receive a patient satisfaction survey over the next few weeks. We would appreciate your feedback and comments so that we may continue to improve ourselves and the health of our patients.

## 2021-09-24 NOTE — Progress Notes (Signed)
[facility] [address] [place]. [place] [address] [phone]       STROKE FOLLOW UP NOTE  [name] Date of Birth:  08-07-1941 Medical Record Number:  [id number]   Reason for Referral: stroke follow up    SUBJECTIVE:   CHIEF COMPLAINT:  Chief Complaint  Patient presents with   Follow-up    Rm 3 alone Pt is well and stable, no new stroke concerns     HPI:   Update 09/24/2021 JM: Returns for 80-month stroke follow-up unaccompanied.  Overall stable without new or reoccurring stroke/TIA symptoms.  Compliant on Plavix and Crestor without side effects.  Recent LDL 140.  Blood pressure today 127/63.  Recent A1c 7.9 up from 6.9. Advised after last visit to f/u with Dr. Estanislado Pandy for [facility] occlusion - he placed orders for repeat CTA head/neck back in June but not yet completed. No new concerns at this time.     History provided for reference purposes only Update 03/15/2021 JM: [name] returns for stroke follow-up unaccompanied.  He has been doing well since prior visit without new or reoccurring stroke/TIAs symptoms.  Compliant on Plavix, aspirin and Crestor without associated side effects. Blood pressure today 126/76.  Glucose levels stable recent A1c 6.9 (down from 8.0).  No new concerns at this time.  Initial visit 11/07/2020 JM: [name] is being seen for hospital follow-up unaccompanied.  Doing well since discharge with complete resolution of stroke deficits He has returned back to all prior activities without difficulty Denies new or recurring stroke/TIA symptoms  Reports compliance on aspirin 325 mg daily and Plavix -denies bleeding or bruising Compliant on Crestor 40 mg daily -denies side effects Blood pressure today 140/72 -monitors at home and typically 130-140s Glucose levels have been stable per patient report  No concerns at this time  Stroke admission 10/06/2020 [name] is a 81 y.o. female with history of DM, GERD,  HLD, HTN, hyperthyroidism who presented on 10/06/2020 with balance issues and unsteady feeling for 3 days.  Personally reviewed hospitalization pertinent progress notes, lab work and imaging with summary provided.  Evaluated by Dr. 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  Diagnostic cerebral angiogram by Dr. Estanislado Pandy and recommended follow-up in 3 months for repeat CTA head/neck as well as long-term BP goal 130-150 to ensure adequate perfusion.  Placed on DAPT for 3 months then Plavix alone.  LDL 99.  A1c 8.0.  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  No prior stroke history.  Other active problems include vascularized lymph node incidentally found on carotid ultrasound and is asymptomatic, recommended follow-up outpatient.  Evaluated by therapies recommended outpatient PT and discharged home in stable condition.  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  MRI  Small acute bilateral cerebellar and right pontine infarcts superimposed on multiple chronic posterior circulation infarcts.   MRA - Proximal occlusion of the basilar artery with distal Reconstitution. Poor visualization of the left PCA beginning at the mid P2 level which may reflect occlusion or severely diminished flow. Heavily diseased distal right vertebral artery. 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  Carotid Doppler unremarkable.  Incidental finding of enlarged vascularized lymph node adjacent to right CCA  2D Echo EF 65-70% LDL 99 HgbA1c 8.0 UDS negative VTE prophylaxis - Heparin subcu aspirin 81 mg daily prior to admission, now on aspirin 325 mg daily and clopidogrel 75 mg daily DAPT. Recommend Asprin  325 mg and Plavix 75 mg for 3 months and then Plavix 75 mg alone. Therapy recommendations: Outpatient PT.  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  Right VA high-grade stenosis versus hypoplastic Likely the cause of patient posterior circulation infarcts S/p diagnostic Cerebral angiogram by Dr. Estanislado Pandy. Occluded b/l VA proximally with distal recon at C2 levels. BA occlusion distal to AICAs. Right PCOM filling both PCAs. SCAs filling from right ICA. Dr. Estanislado Pandy recommended follow-up in 3 months with CTA head and neck.  Avoid low BP  BP goal long term 130-150         ROS:   14 system review of systems performed and negative with exception of no complaints  PMH:  Past Medical History:  Diagnosis Date   CVA (cerebral vascular accident) ([place]) 10/2020   Diabetes ([place])    GERD (gastroesophageal reflux disease)    Hyperlipidemia    Hypertension    Hyperthyroidism    Pneumonia    Shingles     PSH:  Past Surgical History:  Procedure Laterality Date   CATARACT EXTRACTION Left 2020   COLONOSCOPY     DENTAL SURGERY     IR ANGIO INTRA EXTRACRAN SEL COM CAROTID INNOMINATE BILAT MOD SED  10/09/2020   IR ANGIO VERTEBRAL SEL VERTEBRAL BILAT MOD SED  10/09/2020    Social History:  Social History   Socioeconomic History   Marital status: Married    Spouse name: Not on file   Number of children: Not on file   Years of education: Not on file   Highest education level: Not on file  Occupational History   Occupation: [employer]--    Comment: retired  Tobacco Use   Smoking status: Never   Smokeless tobacco: Never  Vaping Use   Vaping Use: Never used  Substance and Sexual Activity   Alcohol use: Yes    Alcohol/week: 0.0 standard drinks    Comment: occasional wine cooler   Drug use: No   Sexual activity: Not Currently    Partners: Male  Other Topics Concern    Not on file  Social History Narrative   Exercise-- yard work,  Walking in [employer]   03/15/21 lives with husband   Social Determinants of Health   Financial Resource Strain: Low Risk    Difficulty of Paying Living Expenses: Not hard at all  Food Insecurity: No Food Insecurity   Worried About Charity fundraiser in the Last Year: Never true   Arboriculturist in the Last Year: Never true  Transportation Needs: No Transportation Needs   Lack of Transportation (Medical): No   Lack of Transportation (Non-Medical): No  Physical Activity: Insufficiently Active   Days of Exercise per Week: 3 days   Minutes of Exercise per Session: 30 min  Stress: No Stress Concern Present   Feeling of Stress : Not at all  Social Connections: Moderately Isolated   Frequency of Communication with Friends and Family: More than three times a week   Frequency of Social Gatherings with Friends and Family: More than three times a week   Attends Religious Services: Never   Marine scientist or Organizations: No   Attends [employer]  or Organization Meetings: Never   Marital Status: Married  Human resources officer Violence: Not At Risk   Fear of Current or Ex-Partner: No   Emotionally Abused: No   Physically Abused: No   Sexually Abused: No    Family History:  Family History  Problem Relation Age of Onset   Diabetes Mother        and on mother's side of family   Colon cancer Mother    Stroke Mother    Diabetes Father        and on father's side of family   Diabetes Sister    Diabetes Sister    Diabetes Brother    Heart disease Neg Hx    Rectal cancer Neg Hx    Stomach cancer Neg Hx     Medications:   Current Outpatient Medications on File Prior to Visit  Medication Sig Dispense Refill   amLODipine (NORVASC) 10 MG tablet Take 1 tablet by mouth once daily 90 tablet 0   clopidogrel (PLAVIX) 75 MG tablet Take 1 tablet by mouth once daily 90 tablet 1   famotidine (PEPCID) 20 MG tablet Take 20 mg by mouth  daily.     fluconazole (DIFLUCAN) 150 MG tablet TAKE 1 TABLET BY MOUTH NOW MAY  REPEAT  IN  3  DAYS  AS  NEEDED 2 tablet 0   glipiZIDE (GLUCOTROL XL) 5 MG 24 hr tablet Take 2 tablets (10 mg total) by mouth daily. 180 tablet 3   glucose blood (ONETOUCH VERIO) test strip USE 1 STRIP TO CHECK GLUCOSE 2x DAILY 200 each 3   hydroxyurea (HYDREA) 500 MG capsule Take 1 capsule by mouth once daily (Patient taking differently: Takes Monday-Friday off SAT, Sun,) 90 capsule 0   Lancets (ONETOUCH DELICA PLUS ENIDPO24M) MISC USE 1  TO CHECK GLUCOSE TWICE DAILY 100 each 3   lisinopril (ZESTRIL) 40 MG tablet Take 1 tablet by mouth once daily 90 tablet 1   metoprolol succinate (TOPROL-XL) 25 MG 24 hr tablet Take 1 tablet by mouth once daily 90 tablet 1   pantoprazole (PROTONIX) 40 MG tablet Take 1 tablet (40 mg total) by mouth daily. 90 tablet 3   rosuvastatin (CRESTOR) 40 MG tablet Take 1 tablet by mouth once daily 90 tablet 1   No current facility-administered medications on file prior to visit.    Allergies:   Allergies  Allergen Reactions   Penicillins Nausea And Vomiting    Has patient had a PCN reaction causing immediate rash, facial/tongue/throat swelling, SOB or lightheadedness with hypotension: no Has patient had a PCN reaction causing severe rash involving mucus membranes or skin necrosis: no Has patient had a PCN reaction that required hospitalization: no Has patient had a PCN reaction occurring within the last 10 years: no If all of the above answers are "NO", then may proceed with Cephalosporin use.       OBJECTIVE:  Physical Exam  Vitals:   09/24/21 1530  BP: 127/63  Pulse: 73  Weight: 121 lb (54.9 kg)  Height: 5\' 4"  (1.626 m)    Body mass index is 20.77 kg/m. No results found.  General: well developed, well nourished,  very pleasant elderly African-American female, seated, in no evident distress Head: head normocephalic and atraumatic.   Neck: supple with no carotid or  supraclavicular bruits Cardiovascular: regular rate and rhythm, no murmurs Musculoskeletal: no deformity Skin:  no rash/petichiae Vascular:  Normal pulses all extremities   Neurologic Exam Mental Status: Awake and fully alert.  Fluent speech and language. Oriented to place and time. Recent and remote memory intact. Attention span, concentration and fund of knowledge appropriate. Mood and affect appropriate.  Cranial Nerves: Pupils equal, briskly reactive to light. Extraocular movements full without nystagmus. Visual fields full to confrontation. Hearing intact. Facial sensation intact. Face, tongue, palate moves normally and symmetrically.  Motor: Normal bulk and tone. Normal strength in all tested extremity muscles Sensory.: intact to touch , pinprick , position and vibratory sensation.  Coordination: Rapid alternating movements normal in all extremities. Finger-to-nose and heel-to-shin performed accurately bilaterally. Gait and Station: Arises from chair without difficulty. Stance is normal. Gait demonstrates normal stride length and balance with without use of assistive device. Tandem walk and heel toe without difficulty.  Romberg negative Reflexes: 1+ and symmetric. Toes downgoing.        ASSESSMENT: Brandi Mccormick is a 81 y.o. 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 Vascular risk factors include HTN, HLD, DM, basilar artery occlusion, polycythemia vera rubra and advanced age.      PLAN:  B cerebellar and R pontine stroke:  Recovered well without residual deficit.   Continue clopidogrel 75 mg daily  and Crestor 40 mg daily for secondary stroke prevention. Discussed secondary stroke prevention measures and importance of close PCP follow up for aggressive stroke risk factor management  HTN: BP goal 130-150 to ensure  adequate perfusion.  Stable on current regimen per PCP HLD: LDL goal <70. Recent LDL 140 (08/13/2021) up from 88 on Crestor 40 mg daily managed by PCP - repeat lipid panel today - if remains above goal, will recommend switching to atorvastatin especially in setting of BA stenosis DMII: A1c goal<7.0.  Prior A1c 7.9 (07/2021) up from 6.9 monitored by endocrinology.  BA occlusion: Dr. Estanislado Pandy placed order for CTA head/neck in 01/2021 but not yet scheduled - per epic review, telephone note from IR with patient declining f/u with Dr. Estanislado Pandy - patient doesn't remember declining follow up with him specifically but more suspect refusing to complete imaging as she says today she will think about doing repeat imaging but not sure if she will. Discussed indication for surveillance monitoring and highly recommend this be completed. New order placed for MRA head due to recent GFR 27 (unable to do CTA head/neck as unable to receive contrast). Based on results, will send back to Dr. Estanislado Pandy if needed    Overall stable and no further recommendations from stroke standpoint - request ongoing management of stroke risk factors to PCP with goals as noted above. Can follow up here as needed   CC:  PCP: [name], DO    I spent 34 minutes of face-to-face and non-face-to-face time with patient.  This included previsit chart review, lab review, study review, order entry, electronic health record documentation, patient education and discussion regarding prior stroke and etiology as well as secondary stroke prevention and aggressive stroke risk factor management, indication for BA occlusion monitoring.  And answered all other questions to patient satisfaction  Frann Rider, [facility]  [facility] [address] [place], [place] [address]  Phone [phone] Fax [phone] Note: This document was prepared with digital dictation and possible smart phrase technology. Any  transcriptional errors that result from this process are unintentional.

## 2021-09-25 ENCOUNTER — Telehealth: Payer: Self-pay | Admitting: Adult Health

## 2021-09-25 ENCOUNTER — Other Ambulatory Visit: Payer: Self-pay | Admitting: Adult Health

## 2021-09-25 ENCOUNTER — Telehealth: Payer: Self-pay

## 2021-09-25 LAB — LIPID PANEL
Chol/HDL Ratio: 3.7 ratio (ref 0.0–4.4)
Cholesterol, Total: 231 mg/dL — ABNORMAL HIGH (ref 100–199)
HDL: 62 mg/dL (ref >39)
LDL Chol Calc (NIH): 150 mg/dL — ABNORMAL HIGH (ref 0–99)
Triglycerides: 109 mg/dL (ref 0–149)
VLDL Cholesterol Cal: 19 mg/dL (ref 5–40)

## 2021-09-25 MED ORDER — ATORVASTATIN CALCIUM 80 MG PO TABS: 80.0000 mg | ORAL_TABLET | Freq: Every day | ORAL | 5 refills | Status: DC

## 2021-09-25 NOTE — Telephone Encounter (Signed)
Contacted pt, informed her recent cholesterol levels show continued elevated LDL or bad cholesterol at 150 with goal less than 70. Jessica recommend stopping Crestor and starting atorvastatin 80 mg daily. Request f/u with PCP in the next 3 months for repeat lab work. Pt understood and had no questions.

## 2021-09-25 NOTE — Telephone Encounter (Signed)
[employer] medicare order sent to GI, they will obtain the auth and reach out to the patient to schedule.  

## 2021-09-25 NOTE — Telephone Encounter (Signed)
-----   Message from Frann Rider, NP sent at 09/25/2021  4:19 PM EST ----- Patient requested telephone call -please advise patient that recent cholesterol levels show continued elevated LDL or bad cholesterol at 150 with goal less than 70. Will recommend stopping Crestor and starting atorvastatin 80 mg daily. Request f/u with PCP in the next 3 months for repeat lab work. Thank you.

## 2021-09-27 ENCOUNTER — Other Ambulatory Visit: Payer: Self-pay | Admitting: Family Medicine

## 2021-09-27 DIAGNOSIS — I10 Essential (primary) hypertension: Secondary | ICD-10-CM

## 2021-10-02 ENCOUNTER — Other Ambulatory Visit: Payer: Medicare HMO

## 2021-10-05 ENCOUNTER — Other Ambulatory Visit: Payer: Self-pay

## 2021-10-05 ENCOUNTER — Ambulatory Visit: Payer: Medicare HMO | Admitting: Podiatry

## 2021-10-05 DIAGNOSIS — M79675 Pain in left toe(s): Secondary | ICD-10-CM | POA: Diagnosis not present

## 2021-10-05 DIAGNOSIS — B351 Tinea unguium: Secondary | ICD-10-CM | POA: Diagnosis not present

## 2021-10-05 DIAGNOSIS — E119 Type 2 diabetes mellitus without complications: Secondary | ICD-10-CM

## 2021-10-05 DIAGNOSIS — M79674 Pain in right toe(s): Secondary | ICD-10-CM | POA: Diagnosis not present

## 2021-10-12 ENCOUNTER — Encounter: Payer: Self-pay | Admitting: Podiatry

## 2021-10-12 NOTE — Progress Notes (Signed)
°  Subjective:  Patient ID: Brandi Mccormick, female    DOB: [birth year],  MRN: [id number]  81 y.o. female presents with preventative diabetic foot care and painful elongated mycotic toenails 1-5 bilaterally which are tender when wearing enclosed shoe gear. Pain is relieved with periodic professional debridement..    Patient's blood sugar was 119 mg/dl today.    New problem(s): None    PCP: [name], DO and last visit was: 08/13/2021.  Review of Systems: Negative except as noted in the HPI.   Allergies  Allergen Reactions   Penicillins Nausea And Vomiting    Has patient had a PCN reaction causing immediate rash, facial/tongue/throat swelling, SOB or lightheadedness with hypotension: no Has patient had a PCN reaction causing severe rash involving mucus membranes or skin necrosis: no Has patient had a PCN reaction that required hospitalization: no Has patient had a PCN reaction occurring within the last 10 years: no If all of the above answers are "NO", then may proceed with Cephalosporin use.     Objective:  There were no vitals filed for this visit. Constitutional Patient is a pleasant 81 y.o. African American female WD, WN in NAD. AAO x 3.  Vascular Capillary fill time to digits immediate b/l.  DP/PT pulse(s) are palpable b/l lower extremities. Pedal hair sparse. Lower extremity skin temperature gradient within normal limits. No pain with calf compression b/l. No edema noted b/l lower extremities. No cyanosis or clubbing noted.   Neurologic Protective sensation intact 5/5 intact bilaterally with 10g monofilament b/l. Vibratory sensation intact b/l. No clonus b/l.   Dermatologic Pedal skin is warm and supple b/l.  No open wounds b/l lower extremities. No interdigital macerations b/l lower extremities. Toenails 1-5 b/l elongated, discolored, dystrophic, thickened, crumbly with subungual debris and tenderness to dorsal palpation.   Orthopedic: Normal muscle strength 5/5 to all  lower extremity muscle groups bilaterally. Patient ambulates independent of any assistive aids.    Hemoglobin A1C Latest Ref Rng & Units 07/11/2021 03/07/2021  HGBA1C 4.0 - 5.6 % 7.9(A) 6.9(A)  Some recent data might be hidden   Assessment:   1. Pain due to onychomycosis of toenails of both feet   2. Controlled type 2 diabetes mellitus without complication, without long-term current use of insulin ([place])    Plan:  Patient was evaluated and treated and all questions answered. Consent given for treatment as described below: -Mycotic toenails 1-5 bilaterally were debrided in length and girth with sterile nail nippers and dremel without incident. -Patient/POA to call should there be question/concern in the interim.  Return in about 9 weeks (around 12/07/2021).  Marzetta Board, DPM

## 2021-10-30 ENCOUNTER — Telehealth: Payer: Self-pay

## 2021-10-30 ENCOUNTER — Other Ambulatory Visit: Payer: Self-pay

## 2021-10-30 ENCOUNTER — Inpatient Hospital Stay: Payer: Medicare HMO | Attending: Nurse Practitioner

## 2021-10-30 DIAGNOSIS — D45 Polycythemia vera: Secondary | ICD-10-CM | POA: Diagnosis not present

## 2021-10-30 DIAGNOSIS — D471 Chronic myeloproliferative disease: Secondary | ICD-10-CM

## 2021-10-30 LAB — CBC WITH DIFFERENTIAL (CANCER CENTER ONLY)
Abs Immature Granulocytes: 0.02 10*3/uL (ref 0.00–0.07)
Basophils Absolute: 0 10*3/uL (ref 0.0–0.1)
Basophils Relative: 1 %
Eosinophils Absolute: 0 10*3/uL (ref 0.0–0.5)
Eosinophils Relative: 1 %
HCT: 36.1 % (ref 36.0–46.0)
Hemoglobin: 11.9 g/dL — ABNORMAL LOW (ref 12.0–15.0)
Immature Granulocytes: 0 %
Lymphocytes Relative: 27 %
Lymphs Abs: 1.4 10*3/uL (ref 0.7–4.0)
MCH: 33.8 pg (ref 26.0–34.0)
MCHC: 33 g/dL (ref 30.0–36.0)
MCV: 102.6 fL — ABNORMAL HIGH (ref 80.0–100.0)
Monocytes Absolute: 0.5 10*3/uL (ref 0.1–1.0)
Monocytes Relative: 11 %
Neutro Abs: 3.1 10*3/uL (ref 1.7–7.7)
Neutrophils Relative %: 60 %
Platelet Count: 476 10*3/uL — ABNORMAL HIGH (ref 150–400)
RBC: 3.52 MIL/uL — ABNORMAL LOW (ref 3.87–5.11)
RDW: 15.4 % (ref 11.5–15.5)
WBC Count: 5.1 10*3/uL (ref 4.0–10.5)
nRBC: 0 % (ref 0.0–0.2)

## 2021-10-30 NOTE — Telephone Encounter (Signed)
Pt verbalized understanding.

## 2021-10-30 NOTE — Telephone Encounter (Signed)
-----   Message from Ladell Pier, MD sent at 10/30/2021 12:56 PM EST ----- Please call patient, CBC looks good, continue hydroxyurea at the current dose, follow-up as scheduled

## 2021-11-08 ENCOUNTER — Ambulatory Visit: Payer: Medicare HMO | Admitting: Internal Medicine

## 2021-11-13 ENCOUNTER — Other Ambulatory Visit: Payer: Self-pay | Admitting: Internal Medicine

## 2021-12-07 ENCOUNTER — Other Ambulatory Visit: Payer: Self-pay | Admitting: Internal Medicine

## 2021-12-11 ENCOUNTER — Inpatient Hospital Stay: Payer: Medicare HMO | Attending: Nurse Practitioner

## 2021-12-11 ENCOUNTER — Inpatient Hospital Stay: Payer: Medicare HMO | Admitting: Oncology

## 2021-12-11 VITALS — BP 123/58 | HR 71 | Temp 97.8°F | Resp 18 | Ht 64.0 in | Wt 121.8 lb

## 2021-12-11 DIAGNOSIS — D45 Polycythemia vera: Secondary | ICD-10-CM | POA: Insufficient documentation

## 2021-12-11 DIAGNOSIS — I10 Essential (primary) hypertension: Secondary | ICD-10-CM | POA: Insufficient documentation

## 2021-12-11 DIAGNOSIS — Z8673 Personal history of transient ischemic attack (TIA), and cerebral infarction without residual deficits: Secondary | ICD-10-CM | POA: Diagnosis not present

## 2021-12-11 DIAGNOSIS — E119 Type 2 diabetes mellitus without complications: Secondary | ICD-10-CM | POA: Diagnosis not present

## 2021-12-11 DIAGNOSIS — D471 Chronic myeloproliferative disease: Secondary | ICD-10-CM

## 2021-12-11 LAB — CBC WITH DIFFERENTIAL (CANCER CENTER ONLY)
Abs Immature Granulocytes: 0.02 10*3/uL (ref 0.00–0.07)
Basophils Absolute: 0.1 10*3/uL (ref 0.0–0.1)
Basophils Relative: 1 %
Eosinophils Absolute: 0.1 10*3/uL (ref 0.0–0.5)
Eosinophils Relative: 2 %
HCT: 38.4 % (ref 36.0–46.0)
Hemoglobin: 12.5 g/dL (ref 12.0–15.0)
Immature Granulocytes: 0 %
Lymphocytes Relative: 21 %
Lymphs Abs: 1.1 10*3/uL (ref 0.7–4.0)
MCH: 33 pg (ref 26.0–34.0)
MCHC: 32.6 g/dL (ref 30.0–36.0)
MCV: 101.3 fL — ABNORMAL HIGH (ref 80.0–100.0)
Monocytes Absolute: 0.5 10*3/uL (ref 0.1–1.0)
Monocytes Relative: 10 %
Neutro Abs: 3.7 10*3/uL (ref 1.7–7.7)
Neutrophils Relative %: 66 %
Platelet Count: 380 10*3/uL (ref 150–400)
RBC: 3.79 MIL/uL — ABNORMAL LOW (ref 3.87–5.11)
RDW: 15 % (ref 11.5–15.5)
WBC Count: 5.5 10*3/uL (ref 4.0–10.5)
nRBC: 0 % (ref 0.0–0.2)

## 2021-12-11 MED ORDER — HYDROXYUREA 500 MG PO CAPS: 500.0000 mg | ORAL_CAPSULE | Freq: Every day | ORAL | 0 refills | Status: DC

## 2021-12-11 NOTE — Progress Notes (Signed)
?  Brandi Mccormick ?OFFICE PROGRESS NOTE ? ? ?Diagnosis: Polycythemia vera ? ?INTERVAL HISTORY:  ? ?Brandi Mccormick continues hydroxyurea.  No nausea, mouth sores, rash, bleeding, or symptom of thrombosis.  She reports her abdomen feels "tight "near the umbilicus at night.  No nausea.  No difficulty with bowel function. ? ?Objective: ? ?Vital signs in last 24 hours: ? ?Blood pressure (!) 123/58, pulse 71, temperature 97.8 ?F (36.6 ?C), temperature source Oral, resp. rate 18, height '5\' 4"'$  (1.626 m), weight 121 lb 12.8 oz (55.2 kg), SpO2 100 %. ?  ? ?HEENT: No thrush or ulcers ?Resp: Lungs clear bilaterally ?Cardio: Regular rate and rhythm ?GI: Soft and nontender, no mass, no hepatosplenomegaly ?Vascular: No leg edema  ? ?Lab Results: ? ?Lab Results  ?Component Value Date  ? WBC 5.5 12/11/2021  ? HGB 12.5 12/11/2021  ? HCT 38.4 12/11/2021  ? MCV 101.3 (H) 12/11/2021  ? PLT 380 12/11/2021  ? NEUTROABS 3.7 12/11/2021  ? ? ?CMP  ?Lab Results  ?Component Value Date  ? NA 139 09/18/2021  ? K 4.4 09/18/2021  ? CL 107 09/18/2021  ? CO2 23 09/18/2021  ? GLUCOSE 309 (H) 09/18/2021  ? BUN 27 (H) 09/18/2021  ? CREATININE 2.23 (H) 09/18/2021  ? CALCIUM 9.4 09/18/2021  ? PROT 6.6 09/18/2021  ? ALBUMIN 3.9 09/18/2021  ? AST 13 (L) 09/18/2021  ? ALT 11 09/18/2021  ? ALKPHOS 62 09/18/2021  ? BILITOT 0.4 09/18/2021  ? GFRNONAA 22 (L) 09/18/2021  ? GFRAA 24 (L) 06/10/2019  ? ? ? ?Medications: I have reviewed the patient's current medications. ? ? 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 ? ? ? ?Disposition: ?Brandi Mccormick appears stable.  The hemoglobin remains in goal range.  She will continue hydroxyurea at the current dose.  She will return for an office and lab visit in 6 weeks. ? ?Brandi Coder, MD ? ?12/11/2021  ?11:22 AM ? ? ?

## 2021-12-25 ENCOUNTER — Encounter: Payer: Self-pay | Admitting: Internal Medicine

## 2021-12-25 ENCOUNTER — Ambulatory Visit: Payer: Medicare HMO | Admitting: Internal Medicine

## 2021-12-25 VITALS — BP 138/88 | HR 85 | Ht 64.0 in | Wt 120.8 lb

## 2021-12-25 DIAGNOSIS — Z8639 Personal history of other endocrine, nutritional and metabolic disease: Secondary | ICD-10-CM | POA: Diagnosis not present

## 2021-12-25 DIAGNOSIS — E1165 Type 2 diabetes mellitus with hyperglycemia: Secondary | ICD-10-CM

## 2021-12-25 DIAGNOSIS — E785 Hyperlipidemia, unspecified: Secondary | ICD-10-CM | POA: Diagnosis not present

## 2021-12-25 DIAGNOSIS — E1169 Type 2 diabetes mellitus with other specified complication: Secondary | ICD-10-CM

## 2021-12-25 LAB — POCT GLYCOSYLATED HEMOGLOBIN (HGB A1C): Hemoglobin A1C: 7.1 % — AB (ref 4.0–5.6)

## 2021-12-25 NOTE — Patient Instructions (Addendum)
Please continue: ?- Glipizide XL 10 mg before breakfast ? ?NO SWEET TEA AND REGULAR SODAS! ? ?Please return in 6 months with your sugar log. ?

## 2021-12-25 NOTE — Progress Notes (Signed)
Patient ID: Brandi Mccormick, female   DOB: 1941-06-05, 81 y.o.   MRN: [id number] ? ?This visit occurred during the SARS-CoV-2 public health emergency.  Safety protocols were in place, including screening questions prior to the visit, additional usage of staff PPE, and extensive cleaning of exam room while observing appropriate contact time as indicated for disinfecting solutions.  ? ?HPI: ?Brandi Mccormick is a 81 y.o.-year-old female, presenting for f/u for DM2, dx 2013, non-insulin-dependent, uncontrolled, with complications (CVA 73/5329, microalbuminuria) and h/o hyperthyroidism. Last visit 5 mo ago. ? ?Interim history: ?No increased urination, blurry vision, nausea, chest pain. ?She reduced sweet drinks (Pepsi) since last visit, but still drinking some.  ?She lost 5 pounds since last visit. ?She had a high blood sugar at 309 on labs obtained at the cancer center (09/18/2021).  She did not see such numbers at home. ? ?DM2: ?Reviewed HbA1c levels: ?07/11/2021: HbA1c calculated from fructosamine is 9.5%, even higher than the directly measured HbA1c... ?Lab Results  ?Component Value Date  ? HGBA1C 7.9 (A) 07/11/2021  ? HGBA1C 6.9 (A) 03/07/2021  ? HGBA1C 8.0 (H) 10/07/2020  ? HGBA1C 7.3 (A) 06/29/2020  ? HGBA1C 7.2 (A) 02/24/2020  ? HGBA1C 7.2 (H) 11/04/2019  ? HGBA1C 6.6 (H) 05/06/2019  ? HGBA1C 6.1 12/21/2018  ? HGBA1C 8.3 (H) 09/11/2018  ? HGBA1C 6.9 (A) 06/04/2018  ? HGBA1C 7.6 (A) 02/02/2018  ? HGBA1C 7.4 10/03/2017  ? HGBA1C 7.0 (H) 04/18/2017  ? HGBA1C 6.9 02/14/2017  ? HGBA1C 7.3 11/15/2016  ?11/02/2020: HbA1c calculated from fructosamine: 10%, even higher than the directly measured HbA1c... ? ?Pt is on a regimen of: ?- Glipizide XL 5 mg in a.m.  >> 10 (2x 5 mg) mg in a.m.  ?We tried Tradjenta 5 mg daily in am - too expensive. ?She was previously on Janumet XR and Metformin ER, but this was stopped 05/2019 due to declining kidney function. ? ?Pt checks her sugars once a day per review of her  log: ?- am:  92-130,  141 >> 88, 90-137, 140 >> 85-130, 135 >> 81-125, 151 ?- 2h after b'fast:  124 >> 125-152 >> 138-144 >> 124-136, 146 >> 124 ?- before lunch: 100-152 >> 129 >> 115-138 >> 123, 137 >> n/c ?- 2h after lunch:   128-152 >>  131-135 >> 138, 148 >> 113-147 >> 137 ?- before dinner: 89, 140-145 >> 97-133 >> 113, 138 >> n/c >> 98 ?- 2h after dinner: 138, 146 >> 142, 150 >> 117-147, 153 >> 109-130 ?- bedtime:  137-169 >> 153 >> 145 >> n/c >> 99-143 >> 123 >> 99 ?- nighttime: n/c >> 161 >> n/c ?Lowest sugar: 72 >> 92 >> 88 >> 85>> 81; it is unclear at which level she has hypoglycemia awareness. ?Highest sugar: 152 >> 152 >> 150 >> 156 >> 309 ([facility]; since then 151). ? ?Meter: OneTouch Verio. ? ?Pt's meals are: ?- Breakfast: egg, coffee, juice ?- Lunch: No Lunch ?- Dinner: meat + vegetables >> late dinner during the weekend as her son is cooking and brings her food ?- Snacks: 1-2 ?Some regular sodas, rarely apple juice. ? ?-+ CKD (FSGS-sees nephrology), last BUN/creatinine:  ?Lab Results  ?Component Value Date  ? BUN 27 (H) 09/18/2021  ? CREATININE 2.23 (H) 09/18/2021  ? ?Lab Results  ?Component Value Date  ? GFRAA 24 (L) 06/10/2019  ? GFRAA 27 (L) 04/28/2019  ? GFRAA 43 (L) 07/27/2018  ? GFRAA >60 06/26/2018  ? GFRAA 38 (L) 06/02/2018  ?  GFRAA 82 06/28/2016  ? GFRAA >90 06/25/2014  ? GFRAA >90 06/24/2014  ? GFRAA >90 10/23/2013  ?12/21/2018: GFR 28.9  ? ?She has MAU: ?Lab Results  ?Component Value Date  ? MICRALBCREAT 43.8 (H) 11/04/2019  ? MICRALBCREAT 77.6 (H) 05/06/2019  ? MICRALBCREAT 422.7 (H) 04/02/2018  ? MICRALBCREAT 225.8 (H) 11/15/2016  ? MICRALBCREAT 48.8 (H) 06/28/2016  ? MICRALBCREAT 9.9 02/21/2014  ? MICRALBCREAT 8.8 11/16/2013  ?On lisinopril 40. ? ?-+ CKD; last set of lipids: ?Lab Results  ?Component Value Date  ? CHOL 231 (H) 09/24/2021  ? HDL 62 09/24/2021  ? [place] 150 (H) 09/24/2021  ? TRIG 109 09/24/2021  ? CHOLHDL 3.7 09/24/2021  ?Prev. on Simvastatin >> off in 06/2016 b/c fatigue >> pravastatin 80 mg  daily in 12/2016 >> Crestor 40 >> Lipitor 80.  ? ?- no numbness and tingling in her feet.  + Onychomycosis.  Latest foot exam: 08/2021 - Dr. Consuelo Pandy. ? ?- Last eye exam in 09/2019: No DR reportedly; Had OS cataract sx.  ? ?H/o hyperthyroidism ?- previously treated with medication, more than 20 years ago.  No other history available. ? ?Pt denies: ?- feeling nodules in neck ?- hoarseness ?- dysphagia ?- choking ?- SOB with lying down ? ?Her TFTs were normal: ?Lab Results  ?Component Value Date  ? TSH 0.72 07/11/2021  ? TSH 0.37 11/02/2020  ? TSH 1.12 05/12/2019  ? TSH 0.635 06/26/2018  ? TSH 1.11 04/02/2018  ? TSH 1.13 07/03/2017  ? TSH 1.12 06/28/2016  ? TSH 1.29 06/23/2015  ? TSH 1.44 10/18/2014  ? TSH 0.93 08/15/2014  ? FREET4 1.04 07/11/2021  ? FREET4 0.85 05/12/2019  ? FREET4 0.93 07/03/2017  ? FREET4 0.96 06/28/2016  ? FREET4 0.91 08/15/2014  ? FREET4 1.00 02/17/2014  ? FREET4 0.97 12/20/2013  ? T3FREE 2.6 07/11/2021  ? T3FREE 2.7 05/12/2019  ? T3FREE 3.2 07/03/2017  ? T3FREE 3.1 06/28/2016  ? T3FREE 3.2 08/15/2014  ? T3FREE 2.6 02/17/2014  ? T3FREE 2.9 12/20/2013  ? 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 ?She also has a history of gout. ?She has chronic myeloproliferative ds.  She is on hydroxyurea. 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 ? ?ROS: ?+ see HPI ? ?I reviewed pt's medications, allergies, PMH, social hx, family hx, and changes were documented in the history of present illness. Otherwise, unchanged from my initial visit note. ? ?Past Medical History:  ?Diagnosis Date  ? CVA (cerebral vascular accident) ([place]) 10/2020  ? Diabetes ([place])   ? GERD (gastroesophageal reflux disease)   ? Hyperlipidemia   ? Hypertension   ? Hyperthyroidism   ? Pneumonia   ? Shingles   ? ?Past Surgical History:  ?Procedure Laterality Date  ? CATARACT EXTRACTION Left 2020  ? COLONOSCOPY    ? DENTAL SURGERY    ? IR ANGIO INTRA EXTRACRAN SEL COM  CAROTID INNOMINATE BILAT MOD SED  10/09/2020  ? IR ANGIO VERTEBRAL SEL VERTEBRAL BILAT MOD SED  10/09/2020  ? ?Social History  ? ?Socioeconomic History  ? Marital status: Married  ?  Spouse name: Not on file  ? Number of children: Not on file  ? Years of education: Not on file  ? Highest education level: Not on file  ?Occupational History  ? Occupation: [employer]--  ?  Comment: retired  ?Tobacco Use  ? Smoking status: Never  ? Smokeless tobacco: Never  ?  Vaping Use  ? Vaping Use: Never used  ?Substance and Sexual Activity  ? Alcohol use: Yes  ?  Alcohol/week: 0.0 standard drinks  ?  Comment: occasional wine cooler  ? Drug use: No  ? Sexual activity: Not Currently  ?  Partners: Male  ?Other Topics Concern  ? Not on file  ?Social History Narrative  ? Exercise-- yard work,  Walking in [employer]  ? 03/15/21 lives with husband  ? ?Social Determinants of Health  ? ?Financial Resource Strain: Low Risk   ? Difficulty of Paying Living Expenses: Not hard at all  ?Food Insecurity: No Food Insecurity  ? Worried About Charity fundraiser in the Last Year: Never true  ? Ran Out of Food in the Last Year: Never true  ?Transportation Needs: No Transportation Needs  ? Lack of Transportation (Medical): No  ? Lack of Transportation (Non-Medical): No  ?Physical Activity: Insufficiently Active  ? Days of Exercise per Week: 3 days  ? Minutes of Exercise per Session: 30 min  ?Stress: No Stress Concern Present  ? Feeling of Stress : Not at all  ?Social Connections: Moderately Isolated  ? Frequency of Communication with Friends and Family: More than three times a week  ? Frequency of Social Gatherings with Friends and Family: More than three times a week  ? Attends Religious Services: Never  ? Active Member of Clubs or Organizations: No  ? Attends Archivist Meetings: Never  ? Marital Status: Married  ?Intimate Partner Violence: Not At Risk  ? Fear of Current or Ex-Partner: No  ? Emotionally Abused: No  ? Physically Abused: No  ? Sexually  Abused: No  ? ?Current Outpatient Medications on File Prior to Visit  ?Medication Sig Dispense Refill  ? amLODipine (NORVASC) 10 MG tablet Take 1 tablet by mouth once daily 90 tablet 1  ? atorvastatin (LIPITOR

## 2022-01-02 ENCOUNTER — Ambulatory Visit (INDEPENDENT_AMBULATORY_CARE_PROVIDER_SITE_OTHER): Payer: Medicare HMO | Admitting: Podiatry

## 2022-01-02 ENCOUNTER — Encounter: Payer: Self-pay | Admitting: Podiatry

## 2022-01-02 DIAGNOSIS — E119 Type 2 diabetes mellitus without complications: Secondary | ICD-10-CM | POA: Diagnosis not present

## 2022-01-02 DIAGNOSIS — M79675 Pain in left toe(s): Secondary | ICD-10-CM

## 2022-01-02 DIAGNOSIS — B351 Tinea unguium: Secondary | ICD-10-CM

## 2022-01-02 DIAGNOSIS — M79674 Pain in right toe(s): Secondary | ICD-10-CM | POA: Diagnosis not present

## 2022-01-10 NOTE — Progress Notes (Signed)
?  Subjective:  ?Patient ID: Brandi Mccormick, female    DOB: [birth year],  MRN: [id number] ? ?Brandi Mccormick presents to clinic today for preventative diabetic foot care and painful elongated mycotic toenails 1-5 bilaterally which are tender when wearing enclosed shoe gear. Pain is relieved with periodic professional debridement. ?  ?Last known HgA1c was unknown. Patient did not check blood glucose today. ? ?New problem(s): None.  ? ?PCP is Carollee Herter, Alferd Apa, DO , and last visit was August 13, 2021. ? ?Allergies  ?Allergen Reactions  ? Penicillins Nausea And Vomiting  ?  Has patient had a PCN reaction causing immediate rash, facial/tongue/throat swelling, SOB or lightheadedness with hypotension: no ?Has patient had a PCN reaction causing severe rash involving mucus membranes or skin necrosis: no ?Has patient had a PCN reaction that required hospitalization: no ?Has patient had a PCN reaction occurring within the last 10 years: no ?If all of the above answers are "NO", then may proceed with Cephalosporin use. ?  ? ? ?Review of Systems: Negative except as noted in the HPI. ? ?Objective: No changes noted in today's physical examination. ? ?Vascular Examination: ?Palpable pedal pulses b/l LE. No pedal edema b/l. Skin temperature gradient WNL b/l. No varicosities b/l. Capillary refill time to digits immediate b/l. Pedal hair sparse.. ? ?Dermatological Examination: ?Pedal skin with normal turgor, texture and tone b/l. No open wounds. No interdigital macerations b/l. Toenails 1-5 b/l thickened, discolored, dystrophic with subungual debris. There is pain on palpation to dorsal aspect of nailplates. . ? ?Neurological Examination: ?Protective sensation intact with 10 gram monofilament b/l LE. Vibratory sensation intact b/l LE.  ? ?Musculoskeletal Examination: ?Muscle strength 5/5 to all LE muscle groups b/l. No pain, crepitus or joint limitation noted with ROM bilateral LE. ? ? ?  Latest Ref Rng & Units 12/25/2021  ?  10:00 AM 07/11/2021  ? 10:46 AM 03/07/2021  ? 10:53 AM  ?Hemoglobin A1C  ?Hemoglobin-A1c 4.0 - 5.6 % 7.1   7.9   6.9    ? ?Assessment/Plan: ?1. Pain due to onychomycosis of toenails of both feet   ?2. Controlled type 2 diabetes mellitus without complication, without long-term current use of insulin ([place])   ?  ? ?-Patient was evaluated and treated. All patient's and/or POA's questions/concerns answered on today's visit. ?-Patient to continue soft, supportive shoe gear daily. ?-Toenails 1-5 b/l were debrided in length and girth with sterile nail nippers and dremel without iatrogenic bleeding.  ?-Patient/POA to call should there be question/concern in the interim.  ? ?Return in about 9 weeks (around 03/06/2022). ? ?Marzetta Board, DPM  ?

## 2022-01-24 ENCOUNTER — Inpatient Hospital Stay: Payer: Medicare HMO | Attending: Nurse Practitioner

## 2022-01-24 ENCOUNTER — Inpatient Hospital Stay: Payer: Medicare HMO | Admitting: Nurse Practitioner

## 2022-01-24 ENCOUNTER — Encounter: Payer: Self-pay | Admitting: Nurse Practitioner

## 2022-01-24 VITALS — BP 141/65 | HR 79 | Temp 98.2°F | Resp 18 | Ht 64.0 in | Wt 121.0 lb

## 2022-01-24 DIAGNOSIS — D45 Polycythemia vera: Secondary | ICD-10-CM | POA: Diagnosis not present

## 2022-01-24 DIAGNOSIS — E119 Type 2 diabetes mellitus without complications: Secondary | ICD-10-CM | POA: Diagnosis not present

## 2022-01-24 DIAGNOSIS — I10 Essential (primary) hypertension: Secondary | ICD-10-CM | POA: Diagnosis not present

## 2022-01-24 DIAGNOSIS — D471 Chronic myeloproliferative disease: Secondary | ICD-10-CM | POA: Diagnosis not present

## 2022-01-24 DIAGNOSIS — Z8673 Personal history of transient ischemic attack (TIA), and cerebral infarction without residual deficits: Secondary | ICD-10-CM | POA: Insufficient documentation

## 2022-01-24 LAB — CBC WITH DIFFERENTIAL (CANCER CENTER ONLY)
Abs Immature Granulocytes: 0.01 10*3/uL (ref 0.00–0.07)
Basophils Absolute: 0 10*3/uL (ref 0.0–0.1)
Basophils Relative: 0 %
Eosinophils Absolute: 0.1 10*3/uL (ref 0.0–0.5)
Eosinophils Relative: 2 %
HCT: 38.5 % (ref 36.0–46.0)
Hemoglobin: 12.7 g/dL (ref 12.0–15.0)
Immature Granulocytes: 0 %
Lymphocytes Relative: 23 %
Lymphs Abs: 1.2 10*3/uL (ref 0.7–4.0)
MCH: 32.9 pg (ref 26.0–34.0)
MCHC: 33 g/dL (ref 30.0–36.0)
MCV: 99.7 fL (ref 80.0–100.0)
Monocytes Absolute: 0.4 10*3/uL (ref 0.1–1.0)
Monocytes Relative: 7 %
Neutro Abs: 3.6 10*3/uL (ref 1.7–7.7)
Neutrophils Relative %: 68 %
Platelet Count: 393 10*3/uL (ref 150–400)
RBC: 3.86 MIL/uL — ABNORMAL LOW (ref 3.87–5.11)
RDW: 15.5 % (ref 11.5–15.5)
WBC Count: 5.3 10*3/uL (ref 4.0–10.5)
nRBC: 0 % (ref 0.0–0.2)

## 2022-01-24 NOTE — Progress Notes (Signed)
  [facility] OFFICE PROGRESS NOTE   Diagnosis: Polycythemia vera   INTERVAL HISTORY:   Brandi Mccormick returns for scheduled.  She continues hydroxyurea.  She feels well.  No bleeding.  No symptom of thrombosis.  She denies nausea/vomiting.  No mouth sores.  No diarrhea.  No rash.  Objective:  Vital signs in last 24 hours:  Blood pressure (!) 141/65, pulse 79, temperature 98.2 F (36.8 C), temperature source Oral, resp. rate 18, height '5\' 4"'$  (1.626 m), weight 121 lb (54.9 kg), SpO2 100 %.    HEENT: No thrush or ulcers. Resp: Lungs clear bilaterally. Cardio: Regular rate and rhythm. GI: Abdomen soft and nontender.  No hepatosplenomegaly. Vascular: No leg edema. Skin: No rash.   Lab Results:  Lab Results  Component Value Date   WBC 5.3 01/24/2022   HGB 12.7 01/24/2022   HCT 38.5 01/24/2022   MCV 99.7 01/24/2022   PLT 393 01/24/2022   NEUTROABS 3.6 01/24/2022    Imaging:  No results found.  Medications: I have reviewed the patient's current medications.  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  Hemoglobin remains in goal range.  She will continue hydroxyurea as she is currently taking.  Lab and office visit in 6 weeks.   Ned Card ANP/GNP-BC   01/24/2022  10:41 AM

## 2022-01-25 ENCOUNTER — Telehealth: Payer: Self-pay

## 2022-01-25 NOTE — Telephone Encounter (Signed)
-----   Message from Ladell Pier, MD sent at 01/24/2022  4:46 PM EDT ----- Please call patient, continue hydroxyurea at the same dose, follow-up as scheduled

## 2022-01-25 NOTE — Telephone Encounter (Signed)
Patient gave verbal understanding and had no further questions or concerns at this time 

## 2022-01-31 ENCOUNTER — Other Ambulatory Visit: Payer: Self-pay | Admitting: Family Medicine

## 2022-02-11 ENCOUNTER — Encounter: Payer: Self-pay | Admitting: Family Medicine

## 2022-02-11 ENCOUNTER — Ambulatory Visit (INDEPENDENT_AMBULATORY_CARE_PROVIDER_SITE_OTHER): Payer: Medicare HMO | Admitting: Family Medicine

## 2022-02-11 VITALS — BP 124/72 | HR 70 | Temp 97.8°F | Resp 18 | Ht 64.0 in | Wt 122.4 lb

## 2022-02-11 DIAGNOSIS — E785 Hyperlipidemia, unspecified: Secondary | ICD-10-CM | POA: Diagnosis not present

## 2022-02-11 DIAGNOSIS — E7800 Pure hypercholesterolemia, unspecified: Secondary | ICD-10-CM | POA: Diagnosis not present

## 2022-02-11 DIAGNOSIS — R011 Cardiac murmur, unspecified: Secondary | ICD-10-CM

## 2022-02-11 DIAGNOSIS — I639 Cerebral infarction, unspecified: Secondary | ICD-10-CM | POA: Diagnosis not present

## 2022-02-11 DIAGNOSIS — E1169 Type 2 diabetes mellitus with other specified complication: Secondary | ICD-10-CM | POA: Diagnosis not present

## 2022-02-11 DIAGNOSIS — E1165 Type 2 diabetes mellitus with hyperglycemia: Secondary | ICD-10-CM | POA: Diagnosis not present

## 2022-02-11 DIAGNOSIS — I152 Hypertension secondary to endocrine disorders: Secondary | ICD-10-CM

## 2022-02-11 DIAGNOSIS — I10 Essential (primary) hypertension: Secondary | ICD-10-CM

## 2022-02-11 DIAGNOSIS — E1159 Type 2 diabetes mellitus with other circulatory complications: Secondary | ICD-10-CM | POA: Diagnosis not present

## 2022-02-11 LAB — CBC WITH DIFFERENTIAL/PLATELET
Basophils Absolute: 0 10*3/uL (ref 0.0–0.1)
Basophils Relative: 0.7 % (ref 0.0–3.0)
Eosinophils Absolute: 0 10*3/uL (ref 0.0–0.7)
Eosinophils Relative: 1.1 % (ref 0.0–5.0)
HCT: 37.4 % (ref 36.0–46.0)
Hemoglobin: 12.4 g/dL (ref 12.0–15.0)
Lymphocytes Relative: 23.2 % (ref 12.0–46.0)
Lymphs Abs: 1 10*3/uL (ref 0.7–4.0)
MCHC: 33.3 g/dL (ref 30.0–36.0)
MCV: 100.8 fl — ABNORMAL HIGH (ref 78.0–100.0)
Monocytes Absolute: 0.3 10*3/uL (ref 0.1–1.0)
Monocytes Relative: 7.9 % (ref 3.0–12.0)
Neutro Abs: 3 10*3/uL (ref 1.4–7.7)
Neutrophils Relative %: 67.1 % (ref 43.0–77.0)
Platelets: 421 10*3/uL — ABNORMAL HIGH (ref 150.0–400.0)
RBC: 3.71 Mil/uL — ABNORMAL LOW (ref 3.87–5.11)
RDW: 17.1 % — ABNORMAL HIGH (ref 11.5–15.5)
WBC: 4.4 10*3/uL (ref 4.0–10.5)

## 2022-02-11 LAB — LIPID PANEL
Cholesterol: 201 mg/dL — ABNORMAL HIGH (ref 0–200)
HDL: 58 mg/dL (ref >39.00)
LDL Cholesterol: 123 mg/dL — ABNORMAL HIGH (ref 0–99)
NonHDL: 143.01
Total CHOL/HDL Ratio: 3
Triglycerides: 98 mg/dL (ref 0.0–149.0)
VLDL: 19.6 mg/dL (ref 0.0–40.0)

## 2022-02-11 LAB — COMPREHENSIVE METABOLIC PANEL
ALT: 25 U/L (ref 0–35)
AST: 15 U/L (ref 0–37)
Albumin: 3.8 g/dL (ref 3.5–5.2)
Alkaline Phosphatase: 84 U/L (ref 39–117)
BUN: 24 mg/dL — ABNORMAL HIGH (ref 6–23)
CO2: 22 mEq/L (ref 19–32)
Calcium: 9.6 mg/dL (ref 8.4–10.5)
Chloride: 109 mEq/L (ref 96–112)
Creatinine, Ser: 1.67 mg/dL — ABNORMAL HIGH (ref 0.40–1.20)
GFR: 28.78 mL/min — ABNORMAL LOW (ref >60.00)
Glucose, Bld: 211 mg/dL — ABNORMAL HIGH (ref 70–99)
Potassium: 4.5 mEq/L (ref 3.5–5.1)
Sodium: 140 mEq/L (ref 135–145)
Total Bilirubin: 0.7 mg/dL (ref 0.2–1.2)
Total Protein: 6.4 g/dL (ref 6.0–8.3)

## 2022-02-11 LAB — MICROALBUMIN / CREATININE URINE RATIO
Creatinine,U: 96.7 mg/dL
Microalb Creat Ratio: 6.3 mg/g (ref 0.0–30.0)
Microalb, Ur: 6 mg/dL — ABNORMAL HIGH (ref 0.0–1.9)

## 2022-02-11 LAB — HEMOGLOBIN A1C: Hgb A1c MFr Bld: 7.2 % — ABNORMAL HIGH (ref 4.6–6.5)

## 2022-02-11 NOTE — Assessment & Plan Note (Signed)
Well controlled, no changes to meds. Encouraged heart healthy diet such as the DASH diet and exercise as tolerated.  °

## 2022-02-11 NOTE — Progress Notes (Signed)
Subjective:   By signing my name below, I, Carylon Perches, attest that this documentation has been prepared under the direction and in the presence of [name] DO 02/11/2022   Patient ID: Brandi Mccormick, female    DOB: 23-Mar-1941, 81 y.o.   MRN: [id number]  Chief Complaint  Patient presents with   Hypertension   Hyperlipidemia   Diabetes   Follow-up    HPI Patient is in today for an office visit  She has been recording her blood sugar levels at home. She reports that as of the morning of 02/11/2022, her blood sugar was 156 mg/dL which she states was recorded before eating. She also reports that the lowest her blood sugar has been was 97 mg/dL. She is currently taking 5 Mg of Glipizide.  Lab Results  Component Value Date   HGBA1C 7.1 (A) 12/25/2021   She is regularly following up with Dr. Cruzita Lederer for endocrinology. She is currently not seeing a neurologist. She reports that she does not have a regular cardiologist.  Past Medical History:  Diagnosis Date   CVA (cerebral vascular accident) ([place]) 10/2020   Diabetes ([place])    GERD (gastroesophageal reflux disease)    Hyperlipidemia    Hypertension    Hyperthyroidism    Pneumonia    Shingles     Past Surgical History:  Procedure Laterality Date   CATARACT EXTRACTION Left 2020   COLONOSCOPY     DENTAL SURGERY     IR ANGIO INTRA EXTRACRAN SEL COM CAROTID INNOMINATE BILAT MOD SED  10/09/2020   IR ANGIO VERTEBRAL SEL VERTEBRAL BILAT MOD SED  10/09/2020    Family History  Problem Relation Age of Onset   Diabetes Mother        and on mother's side of family   Colon cancer Mother    Stroke Mother    Diabetes Father        and on father's side of family   Diabetes Sister    Diabetes Sister    Diabetes Brother    Heart disease Neg Hx    Rectal cancer Neg Hx    Stomach cancer Neg Hx     Social History   Socioeconomic History   Marital status: Married    Spouse name: Not on file   Number of children: Not on  file   Years of education: Not on file   Highest education level: Not on file  Occupational History   Occupation: ABB--    Comment: retired  Tobacco Use   Smoking status: Never   Smokeless tobacco: Never  Vaping Use   Vaping Use: Never used  Substance and Sexual Activity   Alcohol use: Yes    Alcohol/week: 0.0 standard drinks of alcohol    Comment: occasional wine cooler   Drug use: No   Sexual activity: Not Currently    Partners: Male  Other Topics Concern   Not on file  Social History Narrative   Exercise-- yard work,  Walking in [employer]   03/15/21 lives with husband   Social Determinants of Health   Financial Resource Strain: Low Risk  (04/10/2021)   Overall Financial Resource Strain (CARDIA)    Difficulty of Paying Living Expenses: Not hard at all  Food Insecurity: No Food Insecurity (04/10/2021)   Hunger Vital Sign    Worried About Running Out of Food in the Last Year: Never true    [place] in the Last Year: Never true  Transportation  Needs: No Transportation Needs (04/10/2021)   PRAPARE - Hydrologist (Medical): No    Lack of Transportation (Non-Medical): No  Physical Activity: Insufficiently Active (04/10/2021)   Exercise Vital Sign    Days of Exercise per Week: 3 days    Minutes of Exercise per Session: 30 min  Stress: No Stress Concern Present (04/10/2021)   [place]    Feeling of Stress : Not at all  Social Connections: Moderately Isolated (04/10/2021)   Social Connection and Isolation Panel [NHANES]    Frequency of Communication with Friends and Family: More than three times a week    Frequency of Social Gatherings with Friends and Family: More than three times a week    Attends Religious Services: Never    Marine scientist or Organizations: No    Attends Archivist Meetings: Never    Marital Status: Married  Human resources officer Violence: Not At Risk  (04/10/2021)   Humiliation, Afraid, Rape, and Kick questionnaire    Fear of Current or Ex-Partner: No    Emotionally Abused: No    Physically Abused: No    Sexually Abused: No    Outpatient Medications Prior to Visit  Medication Sig Dispense Refill   amLODipine (NORVASC) 10 MG tablet Take 1 tablet by mouth once daily 90 tablet 1   atorvastatin (LIPITOR) 80 MG tablet Take 1 tablet (80 mg total) by mouth daily. 30 tablet 5   clopidogrel (PLAVIX) 75 MG tablet Take 1 tablet by mouth once daily 90 tablet 1   famotidine (PEPCID) 20 MG tablet Take 20 mg by mouth daily.     glipiZIDE (GLUCOTROL XL) 5 MG 24 hr tablet Take 2 tablets by mouth once daily 180 tablet 0   glucose blood (ONETOUCH VERIO) test strip USE 1 STRIP TO CHECK GLUCOSE 2x DAILY 200 each 3   hydroxyurea (HYDREA) 500 MG capsule Take 1 capsule (500 mg total) by mouth daily. May take with food to minimize GI side effects. Take on Monday through Thursday 90 capsule 0   Lancets (ONETOUCH DELICA PLUS [id number]) MISC USE 1  TO CHECK GLUCOSE TWICE DAILY 200 each 3   lisinopril (ZESTRIL) 40 MG tablet Take 1 tablet by mouth once daily 90 tablet 1   metoprolol succinate (TOPROL-XL) 25 MG 24 hr tablet Take 1 tablet by mouth once daily 90 tablet 0   No facility-administered medications prior to visit.    Allergies  Allergen Reactions   Penicillins Nausea And Vomiting    Has patient had a PCN reaction causing immediate rash, facial/tongue/throat swelling, SOB or lightheadedness with hypotension: no Has patient had a PCN reaction causing severe rash involving mucus membranes or skin necrosis: no Has patient had a PCN reaction that required hospitalization: no Has patient had a PCN reaction occurring within the last 10 years: no If all of the above answers are "NO", then may proceed with Cephalosporin use.     Review of Systems  Constitutional:  Negative for fever and malaise/fatigue.  HENT:  Negative for congestion.   Eyes:  Negative for  blurred vision.  Respiratory:  Negative for cough and shortness of breath.   Cardiovascular:  Negative for chest pain, palpitations and leg swelling.  Gastrointestinal:  Negative for vomiting.  Musculoskeletal:  Negative for back pain.  Skin:  Negative for rash.  Neurological:  Negative for loss of consciousness and headaches.       Objective:  Physical Exam Vitals and nursing note reviewed.  Constitutional:      General: She is not in acute distress.    Appearance: Normal appearance. She is not ill-appearing.  HENT:     Head: Normocephalic and atraumatic.     Right Ear: External ear normal.     Left Ear: External ear normal.  Eyes:     Extraocular Movements: Extraocular movements intact.     Pupils: Pupils are equal, round, and reactive to light.  Cardiovascular:     Rate and Rhythm: Normal rate and regular rhythm.     Heart sounds: Murmur heard.     Systolic (1-2, right) murmur is present.     No gallop.  Pulmonary:     Effort: Pulmonary effort is normal. No respiratory distress.     Breath sounds: Normal breath sounds. No wheezing or rales.  Skin:    General: Skin is warm and dry.  Neurological:     Mental Status: She is alert and oriented to person, place, and time.  Psychiatric:        Judgment: Judgment normal.     BP 124/72 (BP Location: Left Arm, Patient Position: Sitting, Cuff Size: Normal)   Pulse 70   Temp 97.8 F (36.6 C) (Oral)   Resp 18   Ht '5\' 4"'$  (1.626 m)   Wt 122 lb 6.4 oz (55.5 kg)   SpO2 100%   BMI 21.01 kg/m  Wt Readings from Last 3 Encounters:  02/11/22 122 lb 6.4 oz (55.5 kg)  01/24/22 121 lb (54.9 kg)  12/25/21 120 lb 12.8 oz (54.8 kg)    Diabetic Foot Exam - Simple   No data filed    Lab Results  Component Value Date   WBC 5.3 01/24/2022   HGB 12.7 01/24/2022   HCT 38.5 01/24/2022   PLT 393 01/24/2022   GLUCOSE 309 (H) 09/18/2021   CHOL 231 (H) 09/24/2021   TRIG 109 09/24/2021   HDL 62 09/24/2021   LDLCALC 150 (H)  09/24/2021   ALT 11 09/18/2021   AST 13 (L) 09/18/2021   NA 139 09/18/2021   K 4.4 09/18/2021   CL 107 09/18/2021   CREATININE 2.23 (H) 09/18/2021   BUN 27 (H) 09/18/2021   CO2 23 09/18/2021   TSH 0.72 07/11/2021   INR 1.0 10/09/2020   HGBA1C 7.1 (A) 12/25/2021   MICROALBUR 46.2 (H) 11/04/2019    Lab Results  Component Value Date   TSH 0.72 07/11/2021   Lab Results  Component Value Date   WBC 5.3 01/24/2022   HGB 12.7 01/24/2022   HCT 38.5 01/24/2022   MCV 99.7 01/24/2022   PLT 393 01/24/2022   Lab Results  Component Value Date   NA 139 09/18/2021   K 4.4 09/18/2021   CO2 23 09/18/2021   GLUCOSE 309 (H) 09/18/2021   BUN 27 (H) 09/18/2021   CREATININE 2.23 (H) 09/18/2021   BILITOT 0.4 09/18/2021   ALKPHOS 62 09/18/2021   AST 13 (L) 09/18/2021   ALT 11 09/18/2021   PROT 6.6 09/18/2021   ALBUMIN 3.9 09/18/2021   CALCIUM 9.4 09/18/2021   ANIONGAP 9 09/18/2021   GFR 18.60 (L) 08/13/2021   Lab Results  Component Value Date   CHOL 231 (H) 09/24/2021   Lab Results  Component Value Date   HDL 62 09/24/2021   Lab Results  Component Value Date   LDLCALC 150 (H) 09/24/2021   Lab Results  Component Value Date   TRIG 109 09/24/2021  Lab Results  Component Value Date   CHOLHDL 3.7 09/24/2021   Lab Results  Component Value Date   HGBA1C 7.1 (A) 12/25/2021       Assessment & Plan:   Problem List Items Addressed This Visit       Unprioritized   Uncontrolled type 2 diabetes mellitus with hyperglycemia ([place])   Relevant Orders   Comprehensive metabolic panel   Lipid panel   Microalbumin / creatinine urine ratio   Hyperlipidemia associated with type 2 diabetes mellitus ([place])    Tolerating statin, encouraged heart healthy diet, avoid trans fats, minimize simple carbs and saturated fats. Increase exercise as tolerated      Relevant Orders   Comprehensive metabolic panel   Lipid panel   Hyperlipidemia    Tolerating statin, encouraged heart healthy  diet, avoid trans fats, minimize simple carbs and saturated fats. Increase exercise as tolerated      Essential hypertension    Well controlled, no changes to meds. Encouraged heart healthy diet such as the DASH diet and exercise as tolerated.       Other Visit Diagnoses     Hypertension associated with diabetes ([place])    -  Primary   Relevant Orders   Comprehensive metabolic panel   Lipid panel   Microalbumin / creatinine urine ratio   Cerebrovascular accident (CVA), unspecified mechanism ([place])       Relevant Orders   CBC with Differential/Platelet   Comprehensive metabolic panel   Hemoglobin A1c   Lipid panel   Microalbumin / creatinine urine ratio   Cardiac murmur       Relevant Orders   ECHOCARDIOGRAM COMPLETE       No orders of the defined types were placed in this encounter.   IAnn Held, DO, personally preformed the services described in this documentation.  All medical record entries made by the scribe were at my direction and in my presence.  I have reviewed the chart and discharge instructions (if applicable) and agree that the record reflects my personal performance and is accurate and complete. 02/11/2022   I,Amber Collins,acting as a scribe for [employer], DO.,have documented all relevant documentation on the behalf of [name], DO,as directed by  [name], DO while in the presence of [name], DO.  [name], DO

## 2022-02-11 NOTE — Assessment & Plan Note (Signed)
Tolerating statin, encouraged heart healthy diet, avoid trans fats, minimize simple carbs and saturated fats. Increase exercise as tolerated 

## 2022-02-11 NOTE — Patient Instructions (Signed)

## 2022-02-12 ENCOUNTER — Other Ambulatory Visit: Payer: Self-pay | Admitting: Family Medicine

## 2022-02-12 DIAGNOSIS — E1169 Type 2 diabetes mellitus with other specified complication: Secondary | ICD-10-CM

## 2022-02-13 ENCOUNTER — Other Ambulatory Visit: Payer: Self-pay

## 2022-02-13 MED ORDER — EZETIMIBE 10 MG PO TABS: 10.0000 mg | ORAL_TABLET | Freq: Every day | ORAL | 2 refills | Status: DC

## 2022-02-15 ENCOUNTER — Ambulatory Visit (INDEPENDENT_AMBULATORY_CARE_PROVIDER_SITE_OTHER): Payer: Medicare HMO

## 2022-02-15 DIAGNOSIS — R011 Cardiac murmur, unspecified: Secondary | ICD-10-CM

## 2022-02-15 LAB — ECHOCARDIOGRAM COMPLETE
AR max vel: 1.53 cm2
AV Area VTI: 1.61 cm2
AV Area mean vel: 1.55 cm2
AV Mean grad: 5 mmHg
AV Peak grad: 8.8 mmHg
Ao pk vel: 1.48 m/s
Area-P 1/2: 3.66 cm2
Calc EF: 67.7 %
S' Lateral: 2.06 cm
Single Plane A2C EF: 70.4 %
Single Plane A4C EF: 65.4 %

## 2022-03-07 ENCOUNTER — Inpatient Hospital Stay: Payer: Medicare HMO | Attending: Nurse Practitioner

## 2022-03-07 ENCOUNTER — Inpatient Hospital Stay: Payer: Medicare HMO | Admitting: Oncology

## 2022-03-07 VITALS — BP 134/58 | HR 76 | Temp 98.2°F | Resp 18 | Ht 64.0 in | Wt 122.2 lb

## 2022-03-07 DIAGNOSIS — Z8673 Personal history of transient ischemic attack (TIA), and cerebral infarction without residual deficits: Secondary | ICD-10-CM | POA: Insufficient documentation

## 2022-03-07 DIAGNOSIS — E119 Type 2 diabetes mellitus without complications: Secondary | ICD-10-CM | POA: Diagnosis not present

## 2022-03-07 DIAGNOSIS — D471 Chronic myeloproliferative disease: Secondary | ICD-10-CM | POA: Diagnosis not present

## 2022-03-07 DIAGNOSIS — D649 Anemia, unspecified: Secondary | ICD-10-CM | POA: Diagnosis not present

## 2022-03-07 DIAGNOSIS — I10 Essential (primary) hypertension: Secondary | ICD-10-CM | POA: Diagnosis not present

## 2022-03-07 DIAGNOSIS — D45 Polycythemia vera: Secondary | ICD-10-CM | POA: Diagnosis not present

## 2022-03-07 LAB — CBC WITH DIFFERENTIAL (CANCER CENTER ONLY)
Abs Immature Granulocytes: 0.01 10*3/uL (ref 0.00–0.07)
Basophils Absolute: 0 10*3/uL (ref 0.0–0.1)
Basophils Relative: 1 %
Eosinophils Absolute: 0.1 10*3/uL (ref 0.0–0.5)
Eosinophils Relative: 2 %
HCT: 37.5 % (ref 36.0–46.0)
Hemoglobin: 12.3 g/dL (ref 12.0–15.0)
Immature Granulocytes: 0 %
Lymphocytes Relative: 24 %
Lymphs Abs: 1.3 10*3/uL (ref 0.7–4.0)
MCH: 32.6 pg (ref 26.0–34.0)
MCHC: 32.8 g/dL (ref 30.0–36.0)
MCV: 99.5 fL (ref 80.0–100.0)
Monocytes Absolute: 0.4 10*3/uL (ref 0.1–1.0)
Monocytes Relative: 8 %
Neutro Abs: 3.6 10*3/uL (ref 1.7–7.7)
Neutrophils Relative %: 65 %
Platelet Count: 435 10*3/uL — ABNORMAL HIGH (ref 150–400)
RBC: 3.77 MIL/uL — ABNORMAL LOW (ref 3.87–5.11)
RDW: 15.2 % (ref 11.5–15.5)
WBC Count: 5.5 10*3/uL (ref 4.0–10.5)
nRBC: 0 % (ref 0.0–0.2)

## 2022-03-07 NOTE — Progress Notes (Signed)
  [facility] OFF[facility] PROGRESS NOTE   Diagnosis: Polycythemia vera  INTERVAL HISTORY:   Ms. Whitebread returns as scheduled.  She continues hydroxyurea.  No mouth sores.  No bleeding or symptom of thrombosis.  No new complaint.  Objective:  Vital signs in last 24 hours:  Blood pressure (!) 134/58, pulse 76, temperature 98.2 F (36.8 C), resp. rate 18, height '5\' 4"'$  (1.626 m), weight 122 lb 3.2 oz (55.4 kg), SpO2 100 %.    HEENT: No thrush or ulcers Resp: Lungs clear bilaterally Cardio: Regular rate and rhythm GI: No hepatosplenomegaly Vascular: No leg edema   Lab Results:  Lab Results  Component Value Date   WBC 5.5 03/07/2022   HGB 12.3 03/07/2022   HCT 37.5 03/07/2022   MCV 99.5 03/07/2022   PLT 435 (H) 03/07/2022   NEUTROABS 3.6 03/07/2022    CMP  Lab Results  Component Value Date   NA 140 02/11/2022   K 4.5 02/11/2022   CL 109 02/11/2022   CO2 22 02/11/2022   GLUCOSE 211 (H) 02/11/2022   BUN 24 (H) 02/11/2022   CREATININE 1.67 (H) 02/11/2022   CALCIUM 9.6 02/11/2022   PROT 6.4 02/11/2022   ALBUMIN 3.8 02/11/2022   AST 15 02/11/2022   ALT 25 02/11/2022   ALKPHOS 84 02/11/2022   BILITOT 0.7 02/11/2022   GFRNONAA 22 (L) 09/18/2021   GFRAA 24 (L) 06/10/2019     Medications: I have reviewed the patient's current medications.   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 Beavers appears stable.  The hemoglobin and platelet count are in goal range.  She will continue hydroxyurea at the current dose.  She will return for a CBC in 6 weeks and an office visit in 12 weeks.  Betsy Coder, MD  03/07/2022  11:04 AM

## 2022-03-11 ENCOUNTER — Ambulatory Visit (INDEPENDENT_AMBULATORY_CARE_PROVIDER_SITE_OTHER): Payer: Medicare HMO | Admitting: Podiatry

## 2022-03-11 ENCOUNTER — Encounter: Payer: Self-pay | Admitting: Podiatry

## 2022-03-11 DIAGNOSIS — L84 Corns and callosities: Secondary | ICD-10-CM | POA: Diagnosis not present

## 2022-03-11 DIAGNOSIS — M79675 Pain in left toe(s): Secondary | ICD-10-CM | POA: Diagnosis not present

## 2022-03-11 DIAGNOSIS — M79674 Pain in right toe(s): Secondary | ICD-10-CM

## 2022-03-11 DIAGNOSIS — E119 Type 2 diabetes mellitus without complications: Secondary | ICD-10-CM | POA: Diagnosis not present

## 2022-03-11 DIAGNOSIS — B351 Tinea unguium: Secondary | ICD-10-CM | POA: Diagnosis not present

## 2022-03-13 ENCOUNTER — Other Ambulatory Visit: Payer: Self-pay | Admitting: Internal Medicine

## 2022-03-16 NOTE — Progress Notes (Signed)
  Subjective:  Patient ID: Brandi Mccormick, female    DOB: [birth year],  MRN: [id number]  NINOSHKA WAINWRIGHT presents to clinic today for preventative diabetic foot care and callus(es) left lower extremity and painful thick toenails that are difficult to trim. Painful toenails interfere with ambulation. Aggravating factors include wearing enclosed shoe gear. Pain is relieved with periodic professional debridement. Painful calluses are aggravated when weightbearing with and without shoegear. Pain is relieved with periodic professional debridement.  Patient states blood glucose was 148 mg/dl today.  Last known HgA1c was unknown.    New problem(s): None.   PCP is Carollee Herter, Alferd Apa, DO , and last visit was  February 11, 2022  Allergies  Allergen Reactions   Penicillins Nausea And Vomiting    Has patient had a PCN reaction causing immediate rash, facial/tongue/throat swelling, SOB or lightheadedness with hypotension: no Has patient had a PCN reaction causing severe rash involving mucus membranes or skin necrosis: no Has patient had a PCN reaction that required hospitalization: no Has patient had a PCN reaction occurring within the last 10 years: no If all of the above answers are "NO", then may proceed with Cephalosporin use.     Review of Systems: Negative except as noted in the HPI.  Objective:   Vascular Examination: Palpable pedal pulses b/l LE. No pedal edema b/l. Skin temperature gradient WNL b/l. No varicosities b/l. Capillary refill time to digits immediate b/l. Pedal hair sparse..  Dermatological Examination: Pedal skin with normal turgor, texture and tone b/l. No open wounds. No interdigital macerations b/l. Toenails 1-5 b/l thickened, discolored, dystrophic with subungual debris. There is pain on palpation to dorsal aspect of nailplates. Hyperkeratotic lesion(s) plantar IPJ of left great toe.  No erythema, no edema, no drainage, no fluctuance.   Neurological  Examination: Protective sensation intact with 10 gram monofilament b/l LE. Vibratory sensation intact b/l LE.   Musculoskeletal Examination: Muscle strength 5/5 to all LE muscle groups b/l. No pain, crepitus or joint limitation noted with ROM bilateral LE.     Latest Ref Rng & Units 02/11/2022   10:11 AM 12/25/2021   10:00 AM 07/11/2021   10:46 AM  Hemoglobin A1C  Hemoglobin-A1c 4.6 - 6.5 % 7.2  7.1  7.9    Assessment/Plan: 1. Pain due to onychomycosis of toenails of both feet   2. Callus   3. Controlled type 2 diabetes mellitus without complication, without long-term current use of insulin ([place])      -Patient was evaluated and treated. All patient's and/or POA's questions/concerns answered on today's visit. -Patient to continue soft, supportive shoe gear daily. -Mycotic toenails 1-5 bilaterally were debrided in length and girth with sterile nail nippers and dremel without incident. -Callus(es) plantar IPJ of left great toe pared utilizing sterile scalpel blade without complication or incident. Total number debrided =1. -Patient/POA to call should there be question/concern in the interim.   Return in about 9 weeks (around 05/13/2022).  Marzetta Board, DPM

## 2022-03-22 ENCOUNTER — Other Ambulatory Visit: Payer: Self-pay | Admitting: Family Medicine

## 2022-03-22 ENCOUNTER — Other Ambulatory Visit: Payer: Self-pay | Admitting: Adult Health

## 2022-03-25 ENCOUNTER — Encounter: Payer: Self-pay | Admitting: Gastroenterology

## 2022-04-02 ENCOUNTER — Other Ambulatory Visit: Payer: Self-pay | Admitting: Family Medicine

## 2022-04-02 ENCOUNTER — Other Ambulatory Visit: Payer: Self-pay | Admitting: Adult Health

## 2022-04-02 DIAGNOSIS — E1122 Type 2 diabetes mellitus with diabetic chronic kidney disease: Secondary | ICD-10-CM | POA: Diagnosis not present

## 2022-04-02 DIAGNOSIS — N051 Unspecified nephritic syndrome with focal and segmental glomerular lesions: Secondary | ICD-10-CM | POA: Diagnosis not present

## 2022-04-02 DIAGNOSIS — I129 Hypertensive chronic kidney disease with stage 1 through stage 4 chronic kidney disease, or unspecified chronic kidney disease: Secondary | ICD-10-CM | POA: Diagnosis not present

## 2022-04-02 DIAGNOSIS — I10 Essential (primary) hypertension: Secondary | ICD-10-CM

## 2022-04-02 DIAGNOSIS — D75839 Thrombocytosis, unspecified: Secondary | ICD-10-CM | POA: Diagnosis not present

## 2022-04-02 DIAGNOSIS — N184 Chronic kidney disease, stage 4 (severe): Secondary | ICD-10-CM | POA: Diagnosis not present

## 2022-04-02 DIAGNOSIS — E785 Hyperlipidemia, unspecified: Secondary | ICD-10-CM | POA: Diagnosis not present

## 2022-04-05 ENCOUNTER — Other Ambulatory Visit: Payer: Self-pay | Admitting: Adult Health

## 2022-04-08 ENCOUNTER — Other Ambulatory Visit: Payer: Self-pay

## 2022-04-08 MED ORDER — ATORVASTATIN CALCIUM 80 MG PO TABS: 80.0000 mg | ORAL_TABLET | Freq: Every day | ORAL | 0 refills | Status: DC

## 2022-04-16 ENCOUNTER — Ambulatory Visit (INDEPENDENT_AMBULATORY_CARE_PROVIDER_SITE_OTHER): Payer: Medicare HMO

## 2022-04-16 VITALS — BP 126/72 | HR 75 | Temp 98.1°F | Resp 16 | Ht 64.0 in | Wt 122.6 lb

## 2022-04-16 DIAGNOSIS — Z0000 Encounter for general adult medical examination without abnormal findings: Secondary | ICD-10-CM | POA: Diagnosis not present

## 2022-04-16 MED ORDER — EZETIMIBE 10 MG PO TABS: 10.0000 mg | ORAL_TABLET | Freq: Every day | ORAL | 2 refills | Status: DC

## 2022-04-16 NOTE — Patient Instructions (Signed)
Brandi Mccormick , Thank you for taking time to come for your Medicare Wellness Visit. I appreciate your ongoing commitment to your health goals. Please review the following plan we discussed and let me know if I can assist you in the future.   Screening recommendations/referrals: Colonoscopy: no longer needed Mammogram: declined Bone Density: declined Recommended yearly ophthalmology/optometry visit for glaucoma screening and checkup Recommended yearly dental visit for hygiene and checkup  Vaccinations: Influenza vaccine: declined Pneumococcal vaccine: declined Tdap vaccine: up to date Shingles vaccine: declined   Covid-19:declined  Advanced directives: no   Conditions/risks identified: see problem list    Next appointment: Follow up in one year for your annual wellness visit 04/18/23   Preventive Care 80 Years and Older, Female Preventive care refers to lifestyle choices and visits with your health care provider that can promote health and wellness. What does preventive care include? A yearly physical exam. This is also called an annual well check. Dental exams once or twice a year. Routine eye exams. Ask your health care provider how often you should have your eyes checked. Personal lifestyle choices, including: Daily care of your teeth and gums. Regular physical activity. Eating a healthy diet. Avoiding tobacco and drug use. Limiting alcohol use. Practicing safe sex. Taking low-dose aspirin every day. Taking vitamin and mineral supplements as recommended by your health care provider. What happens during an annual well check? The services and screenings done by your health care provider during your annual well check will depend on your age, overall health, lifestyle risk factors, and family history of disease. Counseling  Your health care provider may ask you questions about your: Alcohol use. Tobacco use. Drug use. Emotional well-being. Home and relationship  well-being. Sexual activity. Eating habits. History of falls. Memory and ability to understand (cognition). Work and work Statistician. Reproductive health. Screening  You may have the following tests or measurements: Height, weight, and BMI. Blood pressure. Lipid and cholesterol levels. These may be checked every 5 years, or more frequently if you are over 70 years old. Skin check. Lung cancer screening. You may have this screening every year starting at age 97 if you have a 30-pack-year history of smoking and currently smoke or have quit within the past 15 years. Fecal occult blood test (FOBT) of the stool. You may have this test every year starting at age 35. Flexible sigmoidoscopy or colonoscopy. You may have a sigmoidoscopy every 5 years or a colonoscopy every 10 years starting at age 13. Hepatitis C blood test. Hepatitis B blood test. Sexually transmitted disease (STD) testing. Diabetes screening. This is done by checking your blood sugar (glucose) after you have not eaten for a while (fasting). You may have this done every 1-3 years. Bone density scan. This is done to screen for osteoporosis. You may have this done starting at age 72. Mammogram. This may be done every 1-2 years. Talk to your health care provider about how often you should have regular mammograms. Talk with your health care provider about your test results, treatment options, and if necessary, the need for more tests. Vaccines  Your health care provider may recommend certain vaccines, such as: Influenza vaccine. This is recommended every year. Tetanus, diphtheria, and acellular pertussis (Tdap, Td) vaccine. You may need a Td booster every 10 years. Zoster vaccine. You may need this after age 25. Pneumococcal 13-valent conjugate (PCV13) vaccine. One dose is recommended after age 69. Pneumococcal polysaccharide (PPSV23) vaccine. One dose is recommended after age 72. Talk to your  health care provider about which  screenings and vaccines you need and how often you need them. This information is not intended to replace advice given to you by your health care provider. Make sure you discuss any questions you have with your health care provider. Document Released: 09/15/2015 Document Revised: 05/08/2016 Document Reviewed: 06/20/2015 Elsevier Interactive Patient Education  2017 [employer] Prevention in the Home Falls can cause injuries. They can happen to people of all ages. There are many things you can do to make your home safe and to help prevent falls. What can I do on the outside of my home? Regularly fix the edges of walkways and driveways and fix any cracks. Remove anything that might make you trip as you walk through a door, such as a raised step or threshold. Trim any bushes or trees on the path to your home. Use bright outdoor lighting. Clear any walking paths of anything that might make someone trip, such as rocks or tools. Regularly check to see if handrails are loose or broken. Make sure that both sides of any steps have handrails. Any raised decks and porches should have guardrails on the edges. Have any leaves, snow, or ice cleared regularly. Use sand or salt on walking paths during winter. Clean up any spills in your garage right away. This includes oil or grease spills. What can I do in the bathroom? Use night lights. Install grab bars by the toilet and in the tub and shower. Do not use towel bars as grab bars. Use non-skid mats or decals in the tub or shower. If you need to sit down in the shower, use a plastic, non-slip stool. Keep the floor dry. Clean up any water that spills on the floor as soon as it happens. Remove soap buildup in the tub or shower regularly. Attach bath mats securely with double-sided non-slip rug tape. Do not have throw rugs and other things on the floor that can make you trip. What can I do in the bedroom? Use night lights. Make sure that you have a  light by your bed that is easy to reach. Do not use any sheets or blankets that are too big for your bed. They should not hang down onto the floor. Have a firm chair that has side arms. You can use this for support while you get dressed. Do not have throw rugs and other things on the floor that can make you trip. What can I do in the kitchen? Clean up any spills right away. Avoid walking on wet floors. Keep items that you use a lot in easy-to-reach places. If you need to reach something above you, use a strong step stool that has a grab bar. Keep electrical cords out of the way. Do not use floor polish or wax that makes floors slippery. If you must use wax, use non-skid floor wax. Do not have throw rugs and other things on the floor that can make you trip. What can I do with my stairs? Do not leave any items on the stairs. Make sure that there are handrails on both sides of the stairs and use them. Fix handrails that are broken or loose. Make sure that handrails are as long as the stairways. Check any carpeting to make sure that it is firmly attached to the stairs. Fix any carpet that is loose or worn. Avoid having throw rugs at the top or bottom of the stairs. If you do have throw rugs, attach them  to the floor with carpet tape. Make sure that you have a light switch at the top of the stairs and the bottom of the stairs. If you do not have them, ask someone to add them for you. What else can I do to help prevent falls? Wear shoes that: Do not have high heels. Have rubber bottoms. Are comfortable and fit you well. Are closed at the toe. Do not wear sandals. If you use a stepladder: Make sure that it is fully opened. Do not climb a closed stepladder. Make sure that both sides of the stepladder are locked into place. Ask someone to hold it for you, if possible. Clearly mark and make sure that you can see: Any grab bars or handrails. First and last steps. Where the edge of each step  is. Use tools that help you move around (mobility aids) if they are needed. These include: Canes. Walkers. Scooters. Crutches. Turn on the lights when you go into a dark area. Replace any light bulbs as soon as they burn out. Set up your furniture so you have a clear path. Avoid moving your furniture around. If any of your floors are uneven, fix them. If there are any pets around you, be aware of where they are. Review your medicines with your doctor. Some medicines can make you feel dizzy. This can increase your chance of falling. Ask your doctor what other things that you can do to help prevent falls. This information is not intended to replace advice given to you by your health care provider. Make sure you discuss any questions you have with your health care provider. Document Released: 06/15/2009 Document Revised: 01/25/2016 Document Reviewed: 09/23/2014 Elsevier Interactive Patient Education  2017 [employer].

## 2022-04-16 NOTE — Progress Notes (Signed)
Subjective:   MARGEAN KORELL is a 81 y.o. female who presents for Medicare Annual (Subsequent) preventive examination.  Review of Systems           Objective:    There were no vitals filed for this visit. There is no height or weight on file to calculate BMI.     03/07/2022   10:39 AM 01/24/2022   10:22 AM 09/18/2021   12:08 PM 08/07/2021    1:25 PM 08/07/2021    1:15 PM 04/10/2021   10:27 AM 10/26/2020    9:06 AM  Advanced Directives  Does Patient Have a Medical Advance Directive? No No No No No No No  Would patient like information on creating a medical advance directive? No - Patient declined No - Patient declined No - Patient declined No - Patient declined No - Patient declined No - Patient declined No - Patient declined    Current Medications (verified) Outpatient Encounter Medications as of 04/16/2022  Medication Sig   lisinopril (ZESTRIL) 40 MG tablet Take 1 tablet by mouth once daily   amLODipine (NORVASC) 10 MG tablet Take 1 tablet by mouth once daily   atorvastatin (LIPITOR) 80 MG tablet Take 1 tablet (80 mg total) by mouth daily.   clopidogrel (PLAVIX) 75 MG tablet Take 1 tablet by mouth once daily   ezetimibe (ZETIA) 10 MG tablet Take 1 tablet (10 mg total) by mouth daily.   famotidine (PEPCID) 20 MG tablet Take 20 mg by mouth daily.   glipiZIDE (GLUCOTROL XL) 5 MG 24 hr tablet Take 2 tablets by mouth once daily   glucose blood (ONETOUCH VERIO) test strip USE 1 STRIP TO CHECK GLUCOSE 2x DAILY   hydroxyurea (HYDREA) 500 MG capsule Take 1 capsule (500 mg total) by mouth daily. May take with food to minimize GI side effects. Take on Monday through Thursday   Lancets (ONETOUCH DELICA PLUS [id number]) MISC USE 1  TO CHECK GLUCOSE TWICE DAILY   metoprolol succinate (TOPROL-XL) 25 MG 24 hr tablet Take 1 tablet by mouth once daily   No facility-administered encounter medications on file as of 04/16/2022.    Allergies (verified) Penicillins   History: Past Medical  History:  Diagnosis Date   CVA (cerebral vascular accident) ([place]) 10/2020   Diabetes ([place])    GERD (gastroesophageal reflux disease)    Hyperlipidemia    Hypertension    Hyperthyroidism    Pneumonia    Shingles    Past Surgical History:  Procedure Laterality Date   CATARACT EXTRACTION Left 2020   COLONOSCOPY     DENTAL SURGERY     IR ANGIO INTRA EXTRACRAN SEL COM CAROTID INNOMINATE BILAT MOD SED  10/09/2020   IR ANGIO VERTEBRAL SEL VERTEBRAL BILAT MOD SED  10/09/2020   Family History  Problem Relation Age of Onset   Diabetes Mother        and on mother's side of family   Colon cancer Mother    Stroke Mother    Diabetes Father        and on father's side of family   Diabetes Sister    Diabetes Sister    Diabetes Brother    Heart disease Neg Hx    Rectal cancer Neg Hx    Stomach cancer Neg Hx    Social History   Socioeconomic History   Marital status: Married    Spouse name: Not on file   Number of children: Not on file   Years of education: Not  on file   Highest education level: Not on file  Occupational History   Occupation: [employer]--    Comment: retired  Tobacco Use   Smoking status: Never   Smokeless tobacco: Never  Vaping Use   Vaping Use: Never used  Substance and Sexual Activity   Alcohol use: Yes    Alcohol/week: 0.0 standard drinks of alcohol    Comment: occasional wine cooler   Drug use: No   Sexual activity: Not Currently    Partners: Male  Other Topics Concern   Not on file  Social History Narrative   Exercise-- yard work,  Walking in [employer]   03/15/21 lives with husband   Social Determinants of Health   Financial Resource Strain: Low Risk  (04/10/2021)   Overall Financial Resource Strain (CARDIA)    Difficulty of Paying Living Expenses: Not hard at all  Food Insecurity: No Food Insecurity (04/10/2021)   Hunger Vital Sign    Worried About Running Out of Food in the Last Year: Never true    [place] in the Last Year: Never true   Transportation Needs: No Transportation Needs (04/10/2021)   PRAPARE - Hydrologist (Medical): No    Lack of Transportation (Non-Medical): No  Physical Activity: Insufficiently Active (04/10/2021)   Exercise Vital Sign    Days of Exercise per Week: 3 days    Minutes of Exercise per Session: 30 min  Stress: No Stress Concern Present (04/10/2021)   [place]    Feeling of Stress : Not at all  Social Connections: Moderately Isolated (04/10/2021)   Social Connection and Isolation Panel [NHANES]    Frequency of Communication with Friends and Family: More than three times a week    Frequency of Social Gatherings with Friends and Family: More than three times a week    Attends Religious Services: Never    Marine scientist or Organizations: No    Attends Music therapist: Never    Marital Status: Married    Tobacco Counseling Counseling given: Not Answered   Clinical Intake:                 Diabetic?yes Nutrition Risk Assessment:  Has the patient had any N/V/D within the last 2 months?  No  Does the patient have any non-healing wounds?  No  Has the patient had any unintentional weight loss or weight gain?  No   Diabetes:  Is the patient diabetic?  Yes  If diabetic, was a CBG obtained today?  No  Did the patient bring in their glucometer from home?  No  How often do you monitor your CBG's? Once daily.   Financial Strains and Diabetes Management:  Are you having any financial strains with the device, your supplies or your medication? No .  Does the patient want to be seen by Chronic Care Management for management of their diabetes?  No  Would the patient like to be referred to a Nutritionist or for Diabetic Management?  No   Diabetic Exams:  Diabetic Eye Exam: Overdue for diabetic eye exam. Pt has been advised about the importance in completing this exam.  Patient advised to call and schedule an eye exam. Diabetic Foot Exam: Completed 08/13/21           Activities of Daily Living     No data to display          Patient Care  Team: Carollee Herter, Alferd Apa, DO as PCP - General (Family Medicine) Philemon Kingdom, MD as Consulting Physician (Internal Medicine) [facility] [place] as Consulting Physician (Podiatry) Justin Mend, MD as Attending Physician (Nephrology) [name], DPM (Inactive) as Consulting Physician (Podiatry) Luberta Mutter, MD as Consulting Physician (Ophthalmology) Marzetta Board, DPM as Consulting Physician (Podiatry)  Indicate any recent Medical Services you may have received from other than [facility] providers in the past year (date may be approximate).     Assessment:   This is a routine wellness examination for Eliannah.  Hearing/Vision screen No results found.  Dietary issues and exercise activities discussed:     Goals Addressed   None    Depression Screen    04/10/2021   10:30 AM 04/06/2020    9:30 AM 04/05/2019    9:08 AM 04/02/2018    8:04 AM 03/21/2017    8:11 AM 06/23/2015    9:08 PM 06/23/2015    9:08 AM  PHQ 2/9 Scores  PHQ - 2 Score 0 0 0 0 0 0 0    Fall Risk    04/10/2021   10:29 AM 03/15/2021    3:20 PM 04/06/2020    9:29 AM 04/05/2019    9:08 AM 04/02/2018    8:04 AM  Fall Risk   Falls in the past year? 0 0 0 0 No  Number falls in past yr: 0  0    Injury with Fall? 0  0    Follow up Falls prevention discussed  Education provided;Falls prevention discussed      FALL RISK PREVENTION PERTAINING TO THE HOME:  Any stairs in or around the home? No  If so, are there any without handrails?  N/a Home free of loose throw rugs in walkways, pet beds, electrical cords, etc? Yes  Adequate lighting in your home to reduce risk of falls? Yes   ASSISTIVE DEVICES UTILIZED TO PREVENT FALLS:  Life alert? No  Use of a cane, walker or w/c? No  Grab bars in the  bathroom? Yes  Shower chair or bench in shower? No  Elevated toilet seat or a handicapped toilet? No   TIMED UP AND GO:  Was the test performed? Yes .  Length of time to ambulate 10 feet: 10 sec.   Gait steady and fast without use of assistive device  Cognitive Function:    04/06/2020    9:32 AM 04/02/2018    8:05 AM 03/21/2017    8:11 AM 04/24/2015   11:09 AM  MMSE - Mini Mental State Exam  Not completed: Refused     Orientation to time  '5 5 5  '$ Orientation to Place  '5 5 5  '$ Registration  '3 3 3  '$ Attention/ Calculation  '4 3 4  '$ Recall  '2 2 2  '$ Language- name 2 objects  '2 2 2  '$ Language- repeat  '1 1 1  '$ Language- follow 3 step command  '3 3 3  '$ Language- read & follow direction  '1 1 1  '$ Write a sentence  '1 1 1  '$ Copy design  '1 1 1  '$ Total score  '28 27 28        '$ 04/10/2021   10:35 AM  6CIT Screen  What Year? 0 points  What month? 0 points  What time? 0 points  Count back from 20 0 points  Months in reverse 4 points  Repeat phrase 0 points  Total Score 4 points    Immunizations  Immunization History  Administered Date(s) Administered   PFIZER(Purple Top)SARS-COV-2 Vaccination 10/31/2019, 11/24/2019, 08/01/2020   Pneumococcal Polysaccharide-23 11/16/2013    TDAP status: Up to date  Flu Vaccine status: Declined, Education has been provided regarding the importance of this vaccine but patient still declined. Advised may receive this vaccine at local pharmacy or Health Dept. Aware to provide a copy of the vaccination record if obtained from local pharmacy or Health Dept. Verbalized acceptance and understanding.  Pneumococcal vaccine status: Declined,  Education has been provided regarding the importance of this vaccine but patient still declined. Advised may receive this vaccine at local pharmacy or Health Dept. Aware to provide a copy of the vaccination record if obtained from local pharmacy or Health Dept. Verbalized acceptance and understanding.   Covid-19 vaccine status:  Declined, Education has been provided regarding the importance of this vaccine but patient still declined. Advised may receive this vaccine at local pharmacy or Health Dept.or vaccine clinic. Aware to provide a copy of the vaccination record if obtained from local pharmacy or Health Dept. Verbalized acceptance and understanding.  Qualifies for Shingles Vaccine? Yes   Zostavax completed No   Shingrix Completed?: No.    Education has been provided regarding the importance of this vaccine. Patient has been advised to call insurance company to determine out of pocket expense if they have not yet received this vaccine. Advised may also receive vaccine at local pharmacy or Health Dept. Verbalized acceptance and understanding.  Screening Tests Health Maintenance  Topic Date Due   Zoster Vaccines- Shingrix (1 of 2) Never done   OPHTHALMOLOGY EXAM  09/14/2020   COVID-19 Vaccine (4 - Pfizer risk series) 09/26/2020   COLONOSCOPY (Pts 45-36yr Insurance coverage will need to be confirmed)  02/05/2022   INFLUENZA VACCINE  04/02/2022   Pneumonia Vaccine 80 Years old (2 - PCV) 08/13/2022 (Originally 11/17/2014)   FOOT EXAM  08/13/2022   HEMOGLOBIN A1C  08/13/2022   TETANUS/TDAP  06/22/2025   DEXA SCAN  Addressed   HPV VACCINES  Aged Out    Health Maintenance  Health Maintenance Due  Topic Date Due   Zoster Vaccines- Shingrix (1 of 2) Never done   OPHTHALMOLOGY EXAM  09/14/2020   COVID-19 Vaccine (4 - Pfizer risk series) 09/26/2020   COLONOSCOPY (Pts 45-44yrInsurance coverage will need to be confirmed)  02/05/2022   INFLUENZA VACCINE  04/02/2022    Colorectal cancer screening: No longer required.   Mammogram status: Ordered declined. Pt provided with contact info and advised to call to schedule appt.   Bone Density status: Ordered declined. Pt provided with contact info and advised to call to schedule appt.  Lung Cancer Screening: (Low Dose CT Chest recommended if Age 80-80ears, 30  pack-year currently smoking OR have quit w/in 15years.) does not qualify.   Lung Cancer Screening Referral: n/a  Additional Screening:  Hepatitis C Screening: does not qualify; Completed aged out  Vision Screening: Recommended annual ophthalmology exams for early detection of glaucoma and other disorders of the eye. Is the patient up to date with their annual eye exam?  No  Who is the provider or what is the name of the office in which the patient attends annual eye exams? N/a If pt is not established with a provider, would they like to be referred to a provider to establish care? No .   Dental Screening: Recommended annual dental exams for proper oral hygiene  Community Resource Referral / Chronic Care Management: CRR required this visit?  No   CCM required this visit?  No      Plan:     I have personally reviewed and noted the following in the patient's chart:   Medical and social history Use of alcohol, tobacco or illicit drugs  Current medications and supplements including opioid prescriptions.  Functional ability and status Nutritional status Physical activity Advanced directives List of other physicians Hospitalizations, surgeries, and ER visits in previous 12 months Vitals Screenings to include cognitive, depression, and falls Referrals and appointments  In addition, I have reviewed and discussed with patient certain preventive protocols, quality metrics, and best practice recommendations. A written personalized care plan for preventive services as well as general preventive health recommendations were provided to patient.     Duard Brady Mose Colaizzi, [place]   04/16/2022   Nurse Notes: none

## 2022-04-18 ENCOUNTER — Inpatient Hospital Stay: Payer: Medicare HMO | Attending: Nurse Practitioner

## 2022-04-18 DIAGNOSIS — D45 Polycythemia vera: Secondary | ICD-10-CM | POA: Insufficient documentation

## 2022-04-18 DIAGNOSIS — D471 Chronic myeloproliferative disease: Secondary | ICD-10-CM

## 2022-04-18 LAB — CBC WITH DIFFERENTIAL (CANCER CENTER ONLY)
Abs Immature Granulocytes: 0.02 10*3/uL (ref 0.00–0.07)
Basophils Absolute: 0 10*3/uL (ref 0.0–0.1)
Basophils Relative: 1 %
Eosinophils Absolute: 0.1 10*3/uL (ref 0.0–0.5)
Eosinophils Relative: 2 %
HCT: 38.6 % (ref 36.0–46.0)
Hemoglobin: 12.9 g/dL (ref 12.0–15.0)
Immature Granulocytes: 0 %
Lymphocytes Relative: 24 %
Lymphs Abs: 1.7 10*3/uL (ref 0.7–4.0)
MCH: 33.4 pg (ref 26.0–34.0)
MCHC: 33.4 g/dL (ref 30.0–36.0)
MCV: 100 fL (ref 80.0–100.0)
Monocytes Absolute: 0.6 10*3/uL (ref 0.1–1.0)
Monocytes Relative: 8 %
Neutro Abs: 4.6 10*3/uL (ref 1.7–7.7)
Neutrophils Relative %: 65 %
Platelet Count: 447 10*3/uL — ABNORMAL HIGH (ref 150–400)
RBC: 3.86 MIL/uL — ABNORMAL LOW (ref 3.87–5.11)
RDW: 15 % (ref 11.5–15.5)
WBC Count: 7 10*3/uL (ref 4.0–10.5)
nRBC: 0 % (ref 0.0–0.2)

## 2022-04-19 ENCOUNTER — Telehealth: Payer: Self-pay

## 2022-04-19 NOTE — Progress Notes (Addendum)
Subjective:   Brandi Mccormick is a 81 y.o. female who presents for Medicare Annual (Subsequent) preventive examination.  Review of Systems     Cardiac Risk Factors include: advanced age (>36mn, >>39women);diabetes mellitus;hypertension;dyslipidemia     Objective:    Today's Vitals   04/16/22 1005  BP: 126/72  Pulse: 75  Resp: 16  Temp: 98.1 F (36.7 C)  SpO2: 100%  Weight: 122 lb 9.6 oz (55.6 kg)  Height: '5\' 4"'$  (1.626 m)   Body mass index is 21.04 kg/m.     04/16/2022   10:04 AM 03/07/2022   10:39 AM 01/24/2022   10:22 AM 09/18/2021   12:08 PM 08/07/2021    1:25 PM 08/07/2021    1:15 PM 04/10/2021   10:27 AM  Advanced Directives  Does Patient Have a Medical Advance Directive? No No No No No No No  Would patient like information on creating a medical advance directive? No - Patient declined No - Patient declined No - Patient declined No - Patient declined No - Patient declined No - Patient declined No - Patient declined    Current Medications (verified) Outpatient Encounter Medications as of 04/16/2022  Medication Sig   amLODipine (NORVASC) 10 MG tablet Take 1 tablet by mouth once daily   atorvastatin (LIPITOR) 80 MG tablet Take 1 tablet (80 mg total) by mouth daily.   clopidogrel (PLAVIX) 75 MG tablet Take 1 tablet by mouth once daily   ezetimibe (ZETIA) 10 MG tablet Take 1 tablet (10 mg total) by mouth daily.   famotidine (PEPCID) 20 MG tablet Take 20 mg by mouth daily.   glipiZIDE (GLUCOTROL XL) 5 MG 24 hr tablet Take 2 tablets by mouth once daily   glucose blood (ONETOUCH VERIO) test strip USE 1 STRIP TO CHECK GLUCOSE 2x DAILY   hydroxyurea (HYDREA) 500 MG capsule Take 1 capsule (500 mg total) by mouth daily. May take with food to minimize GI side effects. Take on Monday through Thursday   Lancets (ONETOUCH DELICA PLUS [id number] MISC USE 1  TO CHECK GLUCOSE TWICE DAILY   lisinopril (ZESTRIL) 40 MG tablet Take 1 tablet by mouth once daily   metoprolol succinate  (TOPROL-XL) 25 MG 24 hr tablet Take 1 tablet by mouth once daily   [DISCONTINUED] ezetimibe (ZETIA) 10 MG tablet Take 1 tablet (10 mg total) by mouth daily.   No facility-administered encounter medications on file as of 04/16/2022.    Allergies (verified) Penicillins   History: Past Medical History:  Diagnosis Date   CV[place]re[place]   Diabetes ([place]    GERD (gastroesophageal reflux disease)    Hyperlipidemia    Hypertension    Hyperthyroidism    Pneumonia    Shingles    Past Surgical History:  Procedure Laterality Date   CATARACT EXTRACTION Left 2020   COLONOSCOPY     DENTAL SURGERY     IR ANGIO INTRA EXTRACRAN SEL COM CAROTID INNOMINATE BILAT MOD SED  10/09/2020   IR ANGIO VERTEBRAL SEL VERTEBRAL BILAT MOD SED  10/09/2020   Family History  Problem Relation Age of Onset   Diabetes Mother        and on mother's side of family   Colon cancer Mother    Stroke Mother    Diabetes Father        and on father's side of family   Diabetes Sister    Diabetes Sister    Diabetes Brother    Heart disease Neg  Hx    Rectal cancer Neg Hx    Stomach cancer Neg Hx    Social History   Socioeconomic History   Marital status: Married    Spouse name: Not on file   Number of children: Not on file   Years of education: Not on file   Highest education level: Not on file  Occupational History   Occupation: [employer]--    Comment: retired  Tobacco Use   Smoking status: Never   Smokeless tobacco: Never  Vaping Use   Vaping Use: Never used  Substance and Sexual Activity   Alcohol use: Yes    Alcohol/week: 0.0 standard drinks of alcohol    Comment: occasional wine cooler   Drug use: No   Sexual activity: Not Currently    Partners: Male  Other Topics Concern   Not on file  Social History Narrative   Exercise-- yard work,  Walking in [employer]   03/15/21 lives with husband   Social Determinants of Health   Financial Resource Strain: Low Risk  (04/10/2021)    Overall Financial Resource Strain (CARDIA)    Difficulty of Paying Living Expenses: Not hard at all  Food Insecurity: No Food Insecurity (04/10/2021)   Hunger Vital Sign    Worried About Running Out of Food in the Last Year: Never true    [place] in the Last Year: Never true  Transportation Needs: No Transportation Needs (04/10/2021)   PRAPARE - Hydrologist (Medical): No    Lack of Transportation (Non-Medical): No  Physical Activity: Insufficiently Active (04/10/2021)   Exercise Vital Sign    Days of Exercise per Week: 3 days    Minutes of Exercise per Session: 30 min  Stress: No Stress Concern Present (04/10/2021)   [place]    Feeling of Stress : Not at all  Social Connections: Moderately Isolated (04/10/2021)   Social Connection and Isolation Panel [NHANES]    Frequency of Communication with Friends and Family: More than three times a week    Frequency of Social Gatherings with Friends and Family: More than three times a week    Attends Religious Services: Never    Marine scientist or Organizations: No    Attends Music therapist: Never    Marital Status: Married    Tobacco Counseling Counseling given: Not Answered   Clinical Intake:  Pre-visit preparation completed: Yes  Pain : No/denies pain     BMI - recorded: 21 Nutritional Status: BMI of 19-24  Normal Nutritional Risks: None Diabetes: Yes CBG done?: No Did pt. bring in CBG monitor from home?: No  How often do you need to have someone help you when you read instructions, pamphlets, or other written materials from your doctor or pharmacy?: 1 - Never  Diabetic?yes Nutrition Risk Assessment:  Has the patient had any N/V/D within the last 2 months?  No  Does the patient have any non-healing wounds?  No  Has the patient had any unintentional weight loss or weight gain?  No   Diabetes:  Is the  patient diabetic?  Yes  If diabetic, was a CBG obtained today?  No  Did the patient bring in their glucometer from home?  No  How often do you monitor your CBG's? Once daily.   Financial Strains and Diabetes Management:  Are you having any financial strains with the device, your supplies or your medication? No .  Does the patient want to be seen by Chronic Care Management for management of their diabetes?  No  Would the patient like to be referred to a Nutritionist or for Diabetic Management?  No   Diabetic Exams:  Diabetic Eye Exam: Overdue for diabetic eye exam. Pt has been advised about the importance in completing this exam. Patient advised to call and schedule an eye exam. Diabetic Foot Exam: Completed 08/13/21    Interpreter Needed?: No  Information entered by :: [place] of Daily Living    04/16/2022   10:09 AM  In your present state of health, do you have any difficulty performing the following activities:  Hearing? 0  Vision? 0  Difficulty concentrating or making decisions? 0  Walking or climbing stairs? 1  Dressing or bathing? 0  Doing errands, shopping? 0  Preparing Food and eating ? N  Using the Toilet? N  In the past six months, have you accidently leaked urine? N  Do you have problems with loss of bowel control? N  Managing your Medications? N  Managing your Finances? N  Housekeeping or managing your Housekeeping? N    Patient Care Team: Carollee Herter, Alferd Apa, DO as PCP - General (Family Medicine) Philemon Kingdom, MD as Consulting Physician (Internal Medicine) [facility] as Consulting Physician (Podiatry) Justin Mend, MD as Attending Physician (Nephrology) [name], DPM (Inactive) as Consulting Physician (Podiatry) Luberta Mutter, MD as Consulting Physician (Ophthalmology) Marzetta Board, DPM as Consulting Physician (Podiatry)  Indicate any recent Medical Services you may have  received from other than [facility] providers in the past year (date may be approximate).     Assessment:   This is a routine wellness examination for [place].  Hearing/Vision screen No results found.  Dietary issues and exercise activities discussed: Current Exercise Habits: Home exercise routine, Type of exercise: walking, Time (Minutes): 15, Frequency (Times/Week): 7, Weekly Exercise (Minutes/Week): 105, Intensity: Mild, Exercise limited by: None identified   Goals Addressed             This Visit's Progress    DIET - INCREASE WATER INTAKE   On track     Depression Screen    04/16/2022   10:05 AM 04/10/2021   10:30 AM 04/06/2020    9:30 AM 04/05/2019    9:08 AM 04/02/2018    8:04 AM 03/21/2017    8:11 AM 06/23/2015    9:08 PM  PHQ 2/9 Scores  PHQ - 2 Score 0 0 0 0 0 0 0    Fall Risk    04/16/2022   10:04 AM 04/10/2021   10:29 AM 03/15/2021    3:20 PM 04/06/2020    9:29 AM 04/05/2019    9:08 AM  Fall Risk   Falls in the past year? 0 0 0 0 0  Number falls in past yr: 0 0  0   Injury with Fall? 0 0  0   Risk for fall due to : History of fall(s)      Follow up Falls evaluation completed Falls prevention discussed  Education provided;Falls prevention discussed     FALL RISK PREVENTION PERTAINING TO THE HOME:  Any stairs in or around the home? No  If so, are there any without handrails?  N/a Home free of loose throw rugs in walkways, pet beds, electrical cords, etc? Yes  Adequate lighting in your home to reduce risk of falls? Yes   ASSISTIVE DEVICES UTILIZED TO  PREVENT FALLS:  Life alert? No  Use of a cane, walker or w/c? No  Grab bars in the bathroom? Yes  Shower chair or bench in shower? No  Elevated toilet seat or a handicapped toilet? No   TIMED UP AND GO:  Was the test performed? Yes .  Length of time to ambulate 10 feet: 10 sec.   Gait steady and fast without use of assistive device  Cognitive Function:    04/06/2020    9:32 AM 04/02/2018    8:05 AM 03/21/2017     8:11 AM 04/24/2015   11:09 AM  MMSE - Mini Mental State Exam  Not completed: Refused     Orientation to time  '5 5 5  '$ Orientation to Place  '5 5 5  '$ Registration  '3 3 3  '$ Attention/ Calculation  '4 3 4  '$ Recall  '2 2 2  '$ Language- name 2 objects  '2 2 2  '$ Language- repeat  '1 1 1  '$ Language- follow 3 step command  '3 3 3  '$ Language- read & follow direction  '1 1 1  '$ Write a sentence  '1 1 1  '$ Copy design  '1 1 1  '$ Total score  '28 27 28        '$ 04/16/2022   10:12 AM 04/10/2021   10:35 AM  6CIT Screen  What Year? 0 points 0 points  What month? 0 points 0 points  What time? 0 points 0 points  Count back from 20 2 points 0 points  Months in reverse 4 points 4 points  Repeat phrase 2 points 0 points  Total Score 8 points 4 points    Immunizations Immunization History  Administered Date(s) Administered   PFIZER(Purple Top)SARS-COV-2 Vaccination 10/31/2019, 11/24/2019, 08/01/2020   Pneumococcal Polysaccharide-23 11/16/2013    TDAP status: Up to date  Flu Vaccine status: Declined, Education has been provided regarding the importance of this vaccine but patient still declined. Advised may receive this vaccine at local pharmacy or Health Dept. Aware to provide a copy of the vaccination record if obtained from local pharmacy or Health Dept. Verbalized acceptance and understanding.  Pneumococcal vaccine status: Declined,  Education has been provided regarding the importance of this vaccine but patient still declined. Advised may receive this vaccine at local pharmacy or Health Dept. Aware to provide a copy of the vaccination record if obtained from local pharmacy or Health Dept. Verbalized acceptance and understanding.   Covid-19 vaccine status: Declined, Education has been provided regarding the importance of this vaccine but patient still declined. Advised may receive this vaccine at local pharmacy or Health Dept.or vaccine clinic. Aware to provide a copy of the vaccination record if obtained from local  pharmacy or Health Dept. Verbalized acceptance and understanding.  Qualifies for Shingles Vaccine? Yes   Zostavax completed No   Shingrix Completed?: No.    Education has been provided regarding the importance of this vaccine. Patient has been advised to call insurance company to determine out of pocket expense if they have not yet received this vaccine. Advised may also receive vaccine at local pharmacy or Health Dept. Verbalized acceptance and understanding.  Screening Tests Health Maintenance  Topic Date Due   Zoster Vaccines- Shingrix (1 of 2) Never done   OPHTHALMOLOGY EXAM  09/14/2020   COVID-19 Vaccine (4 - Pfizer risk series) 09/26/2020   COLONOSCOPY (Pts 45-72yr Insurance coverage will need to be confirmed)  02/05/2022   INFLUENZA VACCINE  04/02/2022   Pneumonia Vaccine 80 Years old (2 -  PCV) 08/13/2022 (Originally 11/17/2014)   FOOT EXAM  08/13/2022   HEMOGLOBIN A1C  08/13/2022   TETANUS/TDAP  06/22/2025   DEXA SCAN  Addressed   HPV VACCINES  Aged Out    Health Maintenance  Health Maintenance Due  Topic Date Due   Zoster Vaccines- Shingrix (1 of 2) Never done   OPHTHALMOLOGY EXAM  09/14/2020   COVID-19 Vaccine (4 - Pfizer risk series) 09/26/2020   COLONOSCOPY (Pts 45-1yr Insurance coverage will need to be confirmed)  02/05/2022   INFLUENZA VACCINE  04/02/2022    Colorectal cancer screening: No longer required.   Mammogram status: Ordered declined. Pt provided with contact info and advised to call to schedule appt.   Bone Density status: Ordered declined. Pt provided with contact info and advised to call to schedule appt.  Lung Cancer Screening: (Low Dose CT Chest recommended if Age 569-80years, 30 pack-year currently smoking OR have quit w/in 15years.) does not qualify.   Lung Cancer Screening Referral: n/a  Additional Screening:  Hepatitis C Screening: does not qualify; Completed aged out  Vision Screening: Recommended annual ophthalmology exams for early  detection of glaucoma and other disorders of the eye. Is the patient up to date with their annual eye exam?  No  Who is the provider or what is the name of the office in which the patient attends annual eye exams? N/a If pt is not established with a provider, would they like to be referred to a provider to establish care? No .   Dental Screening: Recommended annual dental exams for proper oral hygiene  Community Resource Referral / Chronic Care Management: CRR required this visit?  No   CCM required this visit?  No      Plan:     I have personally reviewed and noted the following in the patient's chart:   Medical and social history Use of alcohol, tobacco or illicit drugs  Current medications and supplements including opioid prescriptions.  Functional ability and status Nutritional status Physical activity Advanced directives List of other physicians Hospitalizations, surgeries, and ER visits in previous 12 months Vitals Screenings to include cognitive, depression, and falls Referrals and appointments  In addition, I have reviewed and discussed with patient certain preventive protocols, quality metrics, and best practice recommendations. A written personalized care plan for preventive services as well as general preventive health recommendations were provided to patient.     SDuard BradyChism, CNorth [place]  04/19/2022   Nurse Notes: none

## 2022-04-19 NOTE — Telephone Encounter (Signed)
Patient gave verbal understanding and had no further questions or concerns at this time 

## 2022-04-19 NOTE — Telephone Encounter (Signed)
-----   Message from Brandi Pier, MD sent at 04/19/2022  8:12 AM EDT ----- Please call patient, the hemoglobin is in goal range, continue hydroxyurea at the current dose, follow-up as scheduled

## 2022-05-01 ENCOUNTER — Other Ambulatory Visit: Payer: Self-pay | Admitting: Internal Medicine

## 2022-05-01 ENCOUNTER — Other Ambulatory Visit: Payer: Self-pay | Admitting: Family Medicine

## 2022-05-01 DIAGNOSIS — E1165 Type 2 diabetes mellitus with hyperglycemia: Secondary | ICD-10-CM

## 2022-05-15 ENCOUNTER — Other Ambulatory Visit: Payer: Self-pay | Admitting: Oncology

## 2022-05-28 ENCOUNTER — Ambulatory Visit: Payer: Medicare HMO | Admitting: Podiatry

## 2022-05-28 ENCOUNTER — Encounter: Payer: Self-pay | Admitting: Podiatry

## 2022-05-28 DIAGNOSIS — M79674 Pain in right toe(s): Secondary | ICD-10-CM

## 2022-05-28 DIAGNOSIS — B351 Tinea unguium: Secondary | ICD-10-CM | POA: Diagnosis not present

## 2022-05-28 DIAGNOSIS — M79675 Pain in left toe(s): Secondary | ICD-10-CM | POA: Diagnosis not present

## 2022-05-28 DIAGNOSIS — Q828 Other specified congenital malformations of skin: Secondary | ICD-10-CM

## 2022-05-28 DIAGNOSIS — E119 Type 2 diabetes mellitus without complications: Secondary | ICD-10-CM

## 2022-05-30 ENCOUNTER — Inpatient Hospital Stay: Payer: Medicare HMO | Attending: Nurse Practitioner | Admitting: Nurse Practitioner

## 2022-05-30 ENCOUNTER — Encounter: Payer: Self-pay | Admitting: Nurse Practitioner

## 2022-05-30 ENCOUNTER — Inpatient Hospital Stay: Payer: Medicare HMO

## 2022-05-30 VITALS — BP 120/59 | HR 70 | Temp 98.1°F | Resp 18 | Ht 64.0 in | Wt 123.6 lb

## 2022-05-30 DIAGNOSIS — D471 Chronic myeloproliferative disease: Secondary | ICD-10-CM | POA: Diagnosis not present

## 2022-05-30 DIAGNOSIS — I10 Essential (primary) hypertension: Secondary | ICD-10-CM | POA: Insufficient documentation

## 2022-05-30 DIAGNOSIS — D649 Anemia, unspecified: Secondary | ICD-10-CM | POA: Insufficient documentation

## 2022-05-30 DIAGNOSIS — E119 Type 2 diabetes mellitus without complications: Secondary | ICD-10-CM | POA: Diagnosis not present

## 2022-05-30 DIAGNOSIS — D45 Polycythemia vera: Secondary | ICD-10-CM | POA: Insufficient documentation

## 2022-05-30 DIAGNOSIS — Z8673 Personal history of transient ischemic attack (TIA), and cerebral infarction without residual deficits: Secondary | ICD-10-CM | POA: Diagnosis not present

## 2022-05-30 LAB — CBC WITH DIFFERENTIAL (CANCER CENTER ONLY)
Abs Immature Granulocytes: 0.01 10*3/uL (ref 0.00–0.07)
Basophils Absolute: 0 10*3/uL (ref 0.0–0.1)
Basophils Relative: 1 %
Eosinophils Absolute: 0.1 10*3/uL (ref 0.0–0.5)
Eosinophils Relative: 2 %
HCT: 40.5 % (ref 36.0–46.0)
Hemoglobin: 13.5 g/dL (ref 12.0–15.0)
Immature Granulocytes: 0 %
Lymphocytes Relative: 23 %
Lymphs Abs: 1.3 10*3/uL (ref 0.7–4.0)
MCH: 33.1 pg (ref 26.0–34.0)
MCHC: 33.3 g/dL (ref 30.0–36.0)
MCV: 99.3 fL (ref 80.0–100.0)
Monocytes Absolute: 0.5 10*3/uL (ref 0.1–1.0)
Monocytes Relative: 8 %
Neutro Abs: 3.9 10*3/uL (ref 1.7–7.7)
Neutrophils Relative %: 66 %
Platelet Count: 448 10*3/uL — ABNORMAL HIGH (ref 150–400)
RBC: 4.08 MIL/uL (ref 3.87–5.11)
RDW: 15 % (ref 11.5–15.5)
WBC Count: 5.8 10*3/uL (ref 4.0–10.5)
nRBC: 0 % (ref 0.0–0.2)

## 2022-05-30 NOTE — Progress Notes (Signed)
  [facility] OFFICE PROGRESS NOTE   Diagnosis: Polycythemia vera  INTERVAL HISTORY:   Ms. Spillane returns as scheduled.  She continues hydroxyurea.  She feels well.  No bleeding.  No symptom of thrombosis.  She denies nausea/vomiting.  No mouth sores.  No diarrhea.  No rash.  She confirms current Hydrea dose is 500 mg daily Monday through Thursday, off other days.  Objective:  Vital signs in last 24 hours:  Blood pressure (!) 120/59, pulse 70, temperature 98.1 F (36.7 C), temperature source Oral, resp. rate 18, height '5\' 4"'$  (1.626 m), weight 123 lb 9.6 oz (56.1 kg), SpO2 100 %.    HEENT: No thrush or ulcers. Resp: Lungs clear bilaterally. Cardio: Regular rate and rhythm. GI: Abdomen soft and nontender.  No hepatosplenomegaly. Vascular: No leg edema.  Calves soft and nontender. Skin: No rash.   Lab Results:  Lab Results  Component Value Date   WBC 5.8 05/30/2022   HGB 13.5 05/30/2022   HCT 40.5 05/30/2022   MCV 99.3 05/30/2022   PLT 448 (H) 05/30/2022   NEUTROABS 3.9 05/30/2022    Imaging:  No results found.  Medications: I have reviewed the patient's current medications.  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 Vanyo appears stable.  Platelet count is just above goal range.  Hemoglobin higher in the normal range.  We decided to adjust the Hydrea dose to 500 mg daily.   She agrees with this plan.  Follow-up CBC in 6 weeks.  Office visit in 12 weeks.    Ned Card ANP/GNP-BC   05/30/2022  10:55 AM

## 2022-06-01 NOTE — Progress Notes (Signed)
  Subjective:  Patient ID: Brandi Mccormick, female    DOB: October 03, 1940,  MRN: [id number]  Brandi Mccormick presents to clinic today for  Chief Complaint  Patient presents with   Nail Problem    Diabetic foot care BS-Did not check A1C-Do not know PCP-[facility] PCP VST-04/2022    New problem(s): None.   PCP is Carollee Herter, Alferd Apa, DO , and last visit was  February 11, 2022.  Allergies  Allergen Reactions   Penicillins Nausea And Vomiting    Has patient had a PCN reaction causing immediate rash, facial/tongue/throat swelling, SOB or lightheadedness with hypotension: no Has patient had a PCN reaction causing severe rash involving mucus membranes or skin necrosis: no Has patient had a PCN reaction that required hospitalization: no Has patient had a PCN reaction occurring within the last 10 years: no If all of the above answers are "NO", then may proceed with Cephalosporin use.    Review of Systems: Negative except as noted in the HPI.  Objective: No changes noted in today's physical examination.  Brandi Mccormick is a pleasant 81 y.o. female WD, WN in NAD. AAO x 3. Vascular Examination: Palpable pedal pulses b/l LE. No pedal edema b/l. Skin temperature gradient WNL b/l. No varicosities b/l. Capillary refill time to digits immediate b/l. Pedal hair sparse..  Dermatological Examination: Pedal skin with normal turgor, texture and tone b/l. No open wounds. No interdigital macerations b/l. Toenails 1-5 b/l thickened, discolored, dystrophic with subungual debris. There is pain on palpation to dorsal aspect of nailplates.   Porokeratotic lesion(s) plantar IPJ of left great toe.  No erythema, no edema, no drainage, no fluctuance.  Incurvated nailplate medial border left hallux with tenderness to palpation. No erythema, no edema, no drainage noted. No fluctuance.   Neurological Examination: Protective sensation intact with 10 gram monofilament b/l LE. Vibratory sensation intact b/l LE.    Musculoskeletal Examination: Muscle strength 5/5 to all LE muscle groups b/l. No pain, crepitus or joint limitation noted with ROM bilateral LE. Assessment/Plan: 1. Pain due to onychomycosis of toenails of both feet   2. Porokeratosis   3. Controlled type 2 diabetes mellitus without complication, without long-term current use of insulin ([place])     No orders of the defined types were placed in this encounter.   -Consent given for treatment as described below: -Examined patient. -Continue foot and shoe inspections daily. Monitor blood glucose per PCP/Endocrinologist's recommendations. -Toenails 1-5 b/l were debrided in length and girth with sterile nail nippers and dremel without iatrogenic bleeding.  -Offending nail border debrided and curretaged left great toe utilizing sterile nail nipper and currette. Border cleansed with alcohol and triple antibiotic applied. No further treatment required by patient/caregiver. Call office if there are any concerns. -Porokeratotic lesion(s) left great toe pared and enucleated with sterile currette without incident. Total number of lesions debrided=1. -Patient/POA to call should there be question/concern in the interim.   Return in about 3 months (around 08/27/2022).  Marzetta Board, DPM

## 2022-06-13 ENCOUNTER — Other Ambulatory Visit: Payer: Self-pay | Admitting: Internal Medicine

## 2022-06-27 ENCOUNTER — Encounter: Payer: Self-pay | Admitting: Internal Medicine

## 2022-06-27 ENCOUNTER — Ambulatory Visit: Payer: Medicare HMO | Admitting: Internal Medicine

## 2022-06-27 VITALS — BP 126/70 | HR 64 | Ht 64.0 in | Wt 123.4 lb

## 2022-06-27 DIAGNOSIS — E1165 Type 2 diabetes mellitus with hyperglycemia: Secondary | ICD-10-CM | POA: Diagnosis not present

## 2022-06-27 LAB — POCT GLYCOSYLATED HEMOGLOBIN (HGB A1C): Hemoglobin A1C: 8.4 % — AB (ref 4.0–5.6)

## 2022-06-27 MED ORDER — GLIPIZIDE ER 5 MG PO TB24: 10.0000 mg | ORAL_TABLET | Freq: Every day | ORAL | 3 refills | Status: DC

## 2022-06-27 NOTE — Progress Notes (Signed)
Patient ID: Brandi Mccormick, female   DOB: 1941-04-19, 81 y.o.   MRN: [id number]  HPI: Brandi Mccormick is a 81 y.o.-year-old female, presenting for f/u for DM2, dx 2013, non-insulin-dependent, uncontrolled, with complications (CVA 48/1856, microalbuminuria) and h/o hyperthyroidism. Last visit 4 mo ago.  Interim history: No increased urination, blurry vision, nausea, chest pain. She reduced sweet drinks (Pepsi) since last visit, but still drinking some.  She stopped Pepsi since last visit but now drinks a lot of cranberry juice!  DM2: Reviewed HbA1c levels: Lab Results  Component Value Date   HGBA1C 7.2 (H) 02/11/2022   HGBA1C 7.1 (A) 12/25/2021   HGBA1C 7.9 (A) 07/11/2021   HGBA1C 6.9 (A) 03/07/2021   HGBA1C 8.0 (H) 10/07/2020   HGBA1C 7.3 (A) 06/29/2020   HGBA1C 7.2 (A) 02/24/2020   HGBA1C 7.2 (H) 11/04/2019   HGBA1C 6.6 (H) 05/06/2019   HGBA1C 6.1 12/21/2018   HGBA1C 8.3 (H) 09/11/2018   HGBA1C 6.9 (A) 06/04/2018   HGBA1C 7.6 (A) 02/02/2018   HGBA1C 7.4 10/03/2017   HGBA1C 7.0 (H) 04/18/2017  07/11/2021: HbA1c calculated from fructosamine is 9.5%, even higher than the directly measured HbA1c. 11/02/2020: HbA1c calculated from fructosamine: 10%, even higher than the directly measured HbA1c.  Pt is on a regimen of: - Glipizide XL 5 mg in a.m.  >> 10 (2x 5 mg) mg in a.m.  We tried Tradjenta 5 mg daily in am - too expensive. She was previously on Janumet XR and Metformin ER, but this was stopped 05/2019 due to declining kidney function.  Pt checks her sugars once a day per review of her  log: - am:   88, 90-137, 140 >> 85-130, 135 >> 81-125, 151 >> 131-156 - 2h after b'fast:  125-152 >> 138-144 >> 124-136, 146 >> 124 >> 151 - before lunch: 129 >> 115-138 >> 123, 137 >> n/c >> 133, 135 - 2h after lunch: 131-135 >> 138, 148 >> 113-147 >> 137 >> 140-153 - before dinner: 97-133 >> 113, 138 >> n/c >> 98 >> 109-125 - 2h after dinner: 142, 150 >> 117-147, 153 >> 109-130 >> 145, 150 -  bedtime:   145 >> n/c >> 99-143 >> 123 >> 99 >> 108, 138 - nighttime: n/c >> 161 >> n/c Lowest sugar: 85 >> 81; it is unclear at which level she has hypoglycemia awareness. Highest sugar: 156 >> 309 ([facility]; since then 151).  Meter: OneTouch Verio.  Pt's meals are: - Breakfast: egg, coffee, juice - Lunch: No Lunch - Dinner: meat + vegetables >> late dinner during the weekend as her son is cooking and brings her food - Snacks: 1-2 Some regular sodas, rarely apple juice.  -+ CKD (FSGS-sees nephrology), last BUN/creatinine:  Lab Results  Component Value Date   BUN 24 (H) 02/11/2022   CREATININE 1.67 (H) 02/11/2022   Lab Results  Component Value Date   GFRAA 24 (L) 06/10/2019   GFRAA 27 (L) 04/28/2019   GFRAA 43 (L) 07/27/2018   GFRAA >60 06/26/2018   GFRAA 38 (L) 06/02/2018   GFRAA 82 06/28/2016   GFRAA >90 06/25/2014   GFRAA >90 06/24/2014   GFRAA >90 10/23/2013  12/21/2018: GFR 28.9   She has MAU: Lab Results  Component Value Date   MICRALBCREAT 6.3 02/11/2022   MICRALBCREAT 43.8 (H) 11/04/2019   MICRALBCREAT 77.6 (H) 05/06/2019   MICRALBCREAT 422.7 (H) 04/02/2018   MICRALBCREAT 225.8 (H) 11/15/2016   MICRALBCREAT 48.8 (H) 06/28/2016   MICRALBCREAT 9.9 02/21/2014  MICRALBCREAT 8.8 11/16/2013  On lisinopril 40.  -+ CKD; last set of lipids: Lab Results  Component Value Date   CHOL 201 (H) 02/11/2022   HDL 58.00 02/11/2022   LDLCALC 123 (H) 02/11/2022   TRIG 98.0 02/11/2022   CHOLHDL 3 02/11/2022  Prev. on Simvastatin >> off in 06/2016 b/c fatigue >> pravastatin 80 mg daily in 12/2016 >> Crestor 40 >> Lipitor 80. Zetia was added.  - no numbness and tingling in her feet.  + Onychomycosis.  Latest foot exam: 03/11/2022- Dr. Consuelo Pandy.  - Last eye exam in 09/2019: No DR reportedly; Had OS cataract sx.   H/o hyperthyroidism - previously treated with medication, more than 20 years ago.  No other history available.  Pt denies: - feeling nodules in neck -  hoarseness - dysphagia - choking  Her TFTs were normal: Lab Results  Component Value Date   TSH 0.72 07/11/2021   TSH 0.37 11/02/2020   TSH 1.12 05/12/2019   TSH 0.635 06/26/2018   TSH 1.11 04/02/2018   TSH 1.13 07/03/2017   TSH 1.12 06/28/2016   TSH 1.29 06/23/2015   TSH 1.44 10/18/2014   TSH 0.93 08/15/2014   FREET4 1.04 07/11/2021   FREET4 0.85 05/12/2019   FREET4 0.93 07/03/2017   FREET4 0.96 06/28/2016   FREET4 0.91 08/15/2014   FREET4 1.00 02/17/2014   FREET4 0.97 12/20/2013   T3FREE 2.6 07/11/2021   T3FREE 2.7 05/12/2019   T3FREE 3.2 07/03/2017   T3FREE 3.1 06/28/2016   T3FREE 3.2 08/15/2014   T3FREE 2.6 02/17/2014   T3FREE 2.9 12/20/2013   She had a CVA on 10/06/2020 >> had  PT. She also has a history of gout. She has chronic myeloproliferative ds.  She is on hydroxyurea. 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  ROS: + see HPI  I reviewed pt's medications, allergies, PMH, social hx, family hx, and changes were documented in the history of present illness. Otherwise, unchanged from my initial visit note.  Past Medical History:  Diagnosis Date   CVA (cerebral vascular accident) ([place]) 10/2020   Diabetes ([place])    GERD (gastroesophageal reflux disease)    Hyperlipidemia    Hypertension    Hyperthyroidism    Pneumonia    Shingles    Past Surgical History:  Procedure Laterality Date   CATARACT EXTRACTION Left 2020   COLONOSCOPY     DENTAL SURGERY     IR ANGIO INTRA EXTRACRAN SEL COM CAROTID INNOMINATE BILAT MOD SED  10/09/2020   IR ANGIO VERTEBRAL SEL VERTEBRAL BILAT MOD SED  10/09/2020   Social History   Socioeconomic History   Marital status: Married    Spouse name: Not on file   Number of children: Not on file   Years of education: Not on file   Highest education level: Not on file  Occupational History   Occupation: [employer]--    Comment: retired  Tobacco Use   Smoking  status: Never   Smokeless tobacco: Never  Vaping Use   Vaping Use: Never used  Substance and Sexual Activity   Alcohol use: Yes    Alcohol/week: 0.0 standard drinks of alcohol    Comment: occasional wine cooler   Drug use: No   Sexual activity: Not Currently    Partners: Male  Other Topics Concern   Not on file  Social History Narrative   Exercise-- yard work,  Walking in [employer]  03/15/21 lives with husband   Social Determinants of Health   Financial Resource Strain: Low Risk  (04/10/2021)   Overall Financial Resource Strain (CARDIA)    Difficulty of Paying Living Expenses: Not hard at all  Food Insecurity: No Food Insecurity (04/10/2021)   Hunger Vital Sign    Worried About Running Out of Food in the Last Year: Never true    Bruno in the Last Year: Never true  Transportation Needs: No Transportation Needs (04/10/2021)   PRAPARE - Hydrologist (Medical): No    Lack of Transportation (Non-Medical): No  Physical Activity: Insufficiently Active (04/10/2021)   Exercise Vital Sign    Days of Exercise per Week: 3 days    Minutes of Exercise per Session: 30 min  Stress: No Stress Concern Present (04/10/2021)   Hooks    Feeling of Stress : Not at all  Social Connections: Moderately Isolated (04/10/2021)   Social Connection and Isolation Panel [NHANES]    Frequency of Communication with Friends and Family: More than three times a week    Frequency of Social Gatherings with Friends and Family: More than three times a week    Attends Religious Services: Never    Marine scientist or Organizations: No    Attends Archivist Meetings: Never    Marital Status: Married  Human resources officer Violence: Not At Risk (04/10/2021)   Humiliation, Afraid, Rape, and Kick questionnaire    Fear of Current or Ex-Partner: No    Emotionally Abused: No    Physically Abused: No    Sexually  Abused: No   Current Outpatient Medications on File Prior to Visit  Medication Sig Dispense Refill   amLODipine (NORVASC) 10 MG tablet Take 1 tablet by mouth once daily 90 tablet 0   atorvastatin (LIPITOR) 80 MG tablet Take 1 tablet (80 mg total) by mouth daily. 90 tablet 0   clopidogrel (PLAVIX) 75 MG tablet Take 1 tablet by mouth once daily 90 tablet 2   ezetimibe (ZETIA) 10 MG tablet Take 1 tablet (10 mg total) by mouth daily. 30 tablet 2   famotidine (PEPCID) 20 MG tablet Take 20 mg by mouth daily.     glipiZIDE (GLUCOTROL XL) 5 MG 24 hr tablet Take 2 tablets by mouth once daily 180 tablet 0   glucose blood (ONETOUCH VERIO) test strip USE 1 STRIP TO CHECK GLUCOSE TWICE DAILY 200 each 0   hydroxyurea (HYDREA) 500 MG capsule TAKE 1 CAPSULE BY MOUTH ONCE DAILY WITH A MEAL TAKE  THIS  MEDICINE  MON-THURS 90 capsule 0   Lancets (ONETOUCH DELICA PLUS [id number]) MISC USE 1  TO CHECK GLUCOSE TWICE DAILY 200 each 3   lisinopril (ZESTRIL) 40 MG tablet Take 1 tablet by mouth once daily 90 tablet 1   metoprolol succinate (TOPROL-XL) 25 MG 24 hr tablet Take 1 tablet (25 mg total) by mouth daily. 90 tablet 1   pantoprazole (PROTONIX) 40 MG tablet Take 40 mg by mouth daily.     No current facility-administered medications on file prior to visit.   Allergies  Allergen Reactions   Penicillins Nausea And Vomiting    Has patient had a PCN reaction causing immediate rash, facial/tongue/throat swelling, SOB or lightheadedness with hypotension: no Has patient had a PCN reaction causing severe rash involving mucus membranes or skin necrosis: no Has patient had a PCN reaction that required hospitalization: no Has patient  had a PCN reaction occurring within the last 10 years: no If all of the above answers are "NO", then may proceed with Cephalosporin use.    Family History  Problem Relation Age of Onset   Diabetes Mother        and on mother's side of family   Colon cancer Mother    Stroke Mother     Diabetes Father        and on father's side of family   Diabetes Sister    Diabetes Sister    Diabetes Brother    Heart disease Neg Hx    Rectal cancer Neg Hx    Stomach cancer Neg Hx    PE: BP 126/70 (BP Location: Left Arm, Patient Position: Sitting, Cuff Size: Normal)   Pulse 64   Ht '5\' 4"'$  (1.626 m)   Wt 123 lb 6.4 oz (56 kg)   SpO2 98%   BMI 21.18 kg/m   Wt Readings from Last 3 Encounters:  06/27/22 123 lb 6.4 oz (56 kg)  05/30/22 123 lb 9.6 oz (56.1 kg)  04/16/22 122 lb 9.6 oz (55.6 kg)   Constitutional:  Normal weight, in NAD Eyes: PERRLA, EOMI, no exophthalmos ENT: moist mucous membranes, no thyromegaly, no cervical lymphadenopathy Cardiovascular: RRR, No MRG Respiratory: CTA B Musculoskeletal: no deformities, strength intact in all 4 Skin: moist, warm, no rashes Neurological: no tremor with outstretched hands, DTR normal in all 4  ASSESSMENT: 1. DM2, non-insulin-dependent, uncontrolled, with complications - CVA 97/4163 - MAU  2. H/o Hyperthyroidism - 20 years ago - was on meds, cannot remember name  3. HL  PLAN:  1. Patient with history of uncontrolled type 2 diabetes on sulfonylurea only.  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  She sees nephrology (Dr. Johnney Ou).  She had a CVA in 10/2020, but afterwards, she started to improve her diet and sugars improved.  HbA1c decreased to 6.9%.  However, afterwards, she started to drink sweet tea and regular sodas and HbA1c starting to increase.  At last visit, most of her CBGs were at goal so we did not change her regimen but I strongly encouraged her to stop any sweet drinks.  HbA1c at that time was 7.2%. - at today's visit, sugars are slightly higher than before especially in the morning mostly in the 140s to 150s.  Later in the day, sugars are mostly in the target range, with few mild hyperglycemic exceptions.  Again, sugars at home do not correlate with the HbA1c.  As of now, I strongly advised her to stop cranberry juice and do not replace it with any other sweet drink, but I advised her to continue the same regimen.  I refilled her levothyroxine. - I suggested to:  Patient Instructions  Please continue: - Glipizide XL 10 mg before breakfast  NO JUICE, SWEET TEA AND REGULAR SODAS!  Please return in 4 months with your sugar log.   - we checked her HbA1c: 8.4% (higher) - advised to check sugars at different times of the day - 1x a day, rotating check times - advised for yearly eye exams >> she is not UTD - return to clinic in 4 months  2. H/o hyperthyroidism -No thyrotoxic complaints -TSH is normal: Lab Results  Component Value Date   TSH 0.72 07/11/2021   3. HL -Reviewed latest lipid panel and LDL was high, above our target of less than 55 due to  history of cardiovascular disease: Lab Results  Component Value Date   CHOL 201 (H) 02/11/2022   HDL 58.00 02/11/2022   LDLCALC 123 (H) 02/11/2022   TRIG 98.0 02/11/2022   CHOLHDL 3 02/11/2022  -Previously on Crestor 40 mg daily but was switched to Lipitor 80 mg daily for last visit. Zetia 10 mg daily was adde after the above results returned.  Brandi Kingdom, MD PhD [facility] Endocrinology

## 2022-06-27 NOTE — Patient Instructions (Addendum)
Please continue: - Glipizide XL 10 mg before breakfast  NO JUICE, SWEET TEA AND REGULAR SODAS!  Please return in 4 months with your sugar log.  

## 2022-07-10 ENCOUNTER — Other Ambulatory Visit: Payer: Self-pay | Admitting: Family Medicine

## 2022-07-11 ENCOUNTER — Inpatient Hospital Stay: Payer: Medicare HMO | Attending: Nurse Practitioner

## 2022-07-11 DIAGNOSIS — D45 Polycythemia vera: Secondary | ICD-10-CM | POA: Insufficient documentation

## 2022-07-11 DIAGNOSIS — D471 Chronic myeloproliferative disease: Secondary | ICD-10-CM

## 2022-07-11 LAB — CBC WITH DIFFERENTIAL (CANCER CENTER ONLY)
Abs Immature Granulocytes: 0.01 10*3/uL (ref 0.00–0.07)
Basophils Absolute: 0 10*3/uL (ref 0.0–0.1)
Basophils Relative: 1 %
Eosinophils Absolute: 0.1 10*3/uL (ref 0.0–0.5)
Eosinophils Relative: 2 %
HCT: 37.3 % (ref 36.0–46.0)
Hemoglobin: 12.6 g/dL (ref 12.0–15.0)
Immature Granulocytes: 0 %
Lymphocytes Relative: 31 %
Lymphs Abs: 1.4 10*3/uL (ref 0.7–4.0)
MCH: 33.9 pg (ref 26.0–34.0)
MCHC: 33.8 g/dL (ref 30.0–36.0)
MCV: 100.3 fL — ABNORMAL HIGH (ref 80.0–100.0)
Monocytes Absolute: 0.4 10*3/uL (ref 0.1–1.0)
Monocytes Relative: 8 %
Neutro Abs: 2.6 10*3/uL (ref 1.7–7.7)
Neutrophils Relative %: 58 %
Platelet Count: 329 10*3/uL (ref 150–400)
RBC: 3.72 MIL/uL — ABNORMAL LOW (ref 3.87–5.11)
RDW: 16.1 % — ABNORMAL HIGH (ref 11.5–15.5)
WBC Count: 4.5 10*3/uL (ref 4.0–10.5)
nRBC: 0 % (ref 0.0–0.2)

## 2022-07-15 ENCOUNTER — Other Ambulatory Visit: Payer: Self-pay

## 2022-07-15 ENCOUNTER — Telehealth: Payer: Self-pay

## 2022-07-15 DIAGNOSIS — D471 Chronic myeloproliferative disease: Secondary | ICD-10-CM

## 2022-07-15 MED ORDER — HYDROXYUREA 500 MG PO CAPS: ORAL_CAPSULE | ORAL | 0 refills | Status: DC

## 2022-07-15 NOTE — Telephone Encounter (Signed)
Patient gave verbal understanding and had no further questions or concerns  

## 2022-07-15 NOTE — Telephone Encounter (Signed)
-----   Message from Owens Shark, NP sent at 07/12/2022  3:36 PM EST ----- Please let her know the platelet count is better.  Continue current dose of Hydrea.  Follow-up as scheduled.

## 2022-08-02 ENCOUNTER — Other Ambulatory Visit: Payer: Self-pay

## 2022-08-02 ENCOUNTER — Ambulatory Visit: Payer: Medicare HMO | Admitting: Gastroenterology

## 2022-08-02 ENCOUNTER — Encounter: Payer: Self-pay | Admitting: Gastroenterology

## 2022-08-02 VITALS — BP 130/62 | HR 100 | Ht 64.0 in | Wt 122.2 lb

## 2022-08-02 DIAGNOSIS — Z800 Family history of malignant neoplasm of digestive organs: Secondary | ICD-10-CM

## 2022-08-02 DIAGNOSIS — R1031 Right lower quadrant pain: Secondary | ICD-10-CM

## 2022-08-02 DIAGNOSIS — Z79899 Other long term (current) drug therapy: Secondary | ICD-10-CM

## 2022-08-02 DIAGNOSIS — K5900 Constipation, unspecified: Secondary | ICD-10-CM

## 2022-08-02 DIAGNOSIS — Z7902 Long term (current) use of antithrombotics/antiplatelets: Secondary | ICD-10-CM

## 2022-08-02 MED ORDER — POLYETHYLENE GLYCOL 3350 17 G PO PACK: 17.0000 g | PACK | Freq: Every day | ORAL | 0 refills | Status: DC | PRN

## 2022-08-02 NOTE — Patient Instructions (Signed)
If you are age 80 or older, your body mass index should be between 23-30. Your Body mass index is 20.98 kg/m. If this is out of the aforementioned range listed, please consider follow up with your Primary Care Provider.  If you are age 97 or younger, your body mass index should be between 19-25. Your Body mass index is 20.98 kg/m. If this is out of the aformentioned range listed, please consider follow up with your Primary Care Provider.   _____________________________________________________  Dennis Bast have been scheduled for a CT scan of the abdomen and pelvis at [facility], 1st floor Radiology. You are scheduled on Monday, Dec 11th at 5:30pm. You should arrive 30 minutes prior to your appointment time for registration.    1) Do not eat or drink anything after 1:30pm (4 hours prior to your test) except, please drink a bottle of water on your way to the appointment.  You may take any medications as prescribed with a small amount of water, if necessary. If you take any of the following medications: METFORMIN, GLUCOPHAGE, [place], AVANDAMET, RIOMET, FORTAMET, ACTOPLUS MET, JANUMET, GLUMETZA or METAGLIP, you MAY be asked to HOLD this medication 48 hours AFTER the exam.    Plan on being at [facility] for 45 minutes or longer, depending on the type of exam you are having performed.   If you have any questions regarding your exam or if you need to reschedule, you may call [facility] at [phone] between the hours of 8:00 am and 5:00 pm, Monday-Friday.  _______________________________________________  Dennis Bast can try a topical muscle rub for your pain.  We have given you samples of the following medication to take: Miralax: Take once daily  Stop Pepcid and take Protonix as needed.  Thank you for entrusting me with your care and for choosing [facility], Dr.  Cellar

## 2022-08-02 NOTE — Progress Notes (Signed)
HPI :  81 y/o female with a history of colon polyps, CVA, chronic plavix use, DM, here to reestablish care to discuss possible surveillance colonoscopy, and abdominal pain.  She has not been seen since June 2018.  Recall that her mother had colon cancer diagnosed in her late 71s.  The patient's last colonoscopy was with me in June 2018, she had 3 small polyps removed, at least 1 of which was an adenoma.  She reports generally moving her bowels once daily to twice daily.  Occasionally she can have some hard stools and feel constipated but this is not every time she goes.  She denies any blood in her stools.  No weight loss.  Her main complaint is some discomfort in her right lower side of her abdomen.  She states this been going on for a few months now.  She interestingly only feels this when she is lying down at night.  She rates it mild, 1 out of 10, but an annoying discomfort that can bother her at night.  She does not really feel it much during the day when she is upright.  She wants to know what is causing this problem.  She denies any postprandial pains.  Having a bowel movement does not change this at all.  She definitely thinks it is a new change for her however.  She denies any trauma to the area.  She had cross-sectional imaging in 2019 over 4 years ago due to pain following a renal biopsy.  She otherwise is noted to be on both Pepcid and Protonix every day.  She is not sure why she is on these.  She states she thinks she has had some reflux in the past but currently has no symptoms of reflux.  Denies any dysphagia.  Eating well.  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  She has a history of a stroke since have last seen her, last year, on Plavix.  She had an echo in June 2023 showing an EF of 65 to 70% and LVH.  She denies any chest pain or shortness of breath today.     Prior history: Colonoscopy October 2009 per Dr. Amedeo Plenty with finding of sigmoid  diverticulosis, no colon polyps.  Colonoscopy 02/05/2017: The perianal and digital rectal examinations were normal. - Two sessile polyps were found in the cecum. The polyps were 2 to 4 mm in size. These polyps were removed with a cold snare. Resection and retrieval were complete. - A 4 mm polyp was found in the sigmoid colon. The polyp was sessile. The polyp was removed with a cold snare. Resection and retrieval were complete. - Scattered medium-mouthed diverticula were found in the entire colon. - Internal hemorrhoids were found during retroflexion. - The exam was otherwise without abnormality.  Surgical [P], cecum, sigmoid, polyp (3) - TUBULAR ADENOMA. - SEPARATE FRAGMENTS OF BENIGN POLYPOID COLORECTAL MUCOSA WITH LYMPHOID AGGREGATES. - HIGH GRADE DYSPLASIA IS NOT IDENTIFIED.  CT scan 06/02/2018: IMPRESSION: Small amount of left perinephric hemorrhage adjacent the lower pole compatible with recent ultrasound-guided percutaneous biopsy earlier today. Colonic diverticulosis. Aortic Atherosclerosis (ICD10-I70.0). 1.4 cm fatty nodule over the uterine fundus possibly a lipoma. Subtle atherosclerotic coronary artery disease. Mild bibasilar atelectasis.     Past Medical History:  Diagnosis Date   CVA (cerebral vascular accident) ([place]) 10/2020   Diabetes ([place])    GERD (gastroesophageal reflux disease)    Hyperlipidemia    Hypertension    Hyperthyroidism  Pneumonia    Polycythemia    Shingles      Past Surgical History:  Procedure Laterality Date   CATARACT EXTRACTION Left 2020   COLONOSCOPY     DENTAL SURGERY     IR ANGIO INTRA EXTRACRAN SEL COM CAROTID INNOMINATE BILAT MOD SED  10/09/2020   IR ANGIO VERTEBRAL SEL VERTEBRAL BILAT MOD SED  10/09/2020   Family History  Problem Relation Age of Onset   Diabetes Mother        and on mother's side of family   Colon cancer Mother    Stroke Mother    Diabetes Father        and on father's side of family   Diabetes Sister     Diabetes Sister    Diabetes Brother    Heart disease Neg Hx    Rectal cancer Neg Hx    Stomach cancer Neg Hx    Social History   Tobacco Use   Smoking status: Never   Smokeless tobacco: Never  Vaping Use   Vaping Use: Never used  Substance Use Topics   Alcohol use: Yes    Alcohol/week: 0.0 standard drinks of alcohol    Comment: occasional wine cooler   Drug use: No   Current Outpatient Medications  Medication Sig Dispense Refill   amLODipine (NORVASC) 10 MG tablet Take 1 tablet by mouth once daily 90 tablet 0   atorvastatin (LIPITOR) 80 MG tablet Take 1 tablet (80 mg total) by mouth daily. 90 tablet 0   clopidogrel (PLAVIX) 75 MG tablet Take 1 tablet by mouth once daily 90 tablet 2   ezetimibe (ZETIA) 10 MG tablet Take 1 tablet (10 mg total) by mouth daily. 30 tablet 2   famotidine (PEPCID) 20 MG tablet Take 20 mg by mouth daily.     glipiZIDE (GLUCOTROL XL) 5 MG 24 hr tablet Take 2 tablets (10 mg total) by mouth daily. 180 tablet 3   glucose blood (ONETOUCH VERIO) test strip USE 1 STRIP TO CHECK GLUCOSE TWICE DAILY 200 each 0   hydroxyurea (HYDREA) 500 MG capsule TAKE 1 CAPSULE BY MOUTH ONCE DAILY WITH A MEAL TAKE  THIS  MEDICINE  MON-THURS 90 capsule 0   Lancets (ONETOUCH DELICA PLUS [id number]) MISC USE 1  TO CHECK GLUCOSE TWICE DAILY 200 each 3   lisinopril (ZESTRIL) 40 MG tablet Take 1 tablet by mouth once daily 90 tablet 1   metoprolol succinate (TOPROL-XL) 25 MG 24 hr tablet Take 1 tablet (25 mg total) by mouth daily. 90 tablet 1   pantoprazole (PROTONIX) 40 MG tablet Take 40 mg by mouth daily.     No current facility-administered medications for this visit.   Allergies  Allergen Reactions   Penicillins Nausea And Vomiting    Has patient had a PCN reaction causing immediate rash, facial/tongue/throat swelling, SOB or lightheadedness with hypotension: no Has patient had a PCN reaction causing severe rash involving mucus membranes or skin necrosis: no Has patient had a  PCN reaction that required hospitalization: no Has patient had a PCN reaction occurring within the last 10 years: no If all of the above answers are "NO", then may proceed with Cephalosporin use.      Review of Systems: All systems reviewed and negative except where noted in HPI.    Lab Results  Component Value Date   WBC 4.5 07/11/2022   HGB 12.6 07/11/2022   HCT 37.3 07/11/2022   MCV 100.3 (H) 07/11/2022   PLT 329 07/11/2022  Lab Results  Component Value Date   CREATININE 1.67 (H) 02/11/2022   BUN 24 (H) 02/11/2022   NA 140 02/11/2022   K 4.5 02/11/2022   CL 109 02/11/2022   CO2 22 02/11/2022    Lab Results  Component Value Date   ALT 25 02/11/2022   AST 15 02/11/2022   ALKPHOS 84 02/11/2022   BILITOT 0.7 02/11/2022     Physical Exam: BP 130/62   Pulse 100   Ht '5\' 4"'$  (1.626 m)   Wt 122 lb 3.2 oz (55.4 kg)   SpO2 97%   BMI 20.98 kg/m  Constitutional: Pleasant,well-developed, female in no acute distress. HEENT: Normocephalic and atraumatic. Conjunctivae are normal. No scleral icterus. Neck supple.  Cardiovascular: Normal rate, regular rhythm.  Pulmonary/chest: Effort normal and breath sounds normal. No wheezing, rales or rhonchi. Abdominal: Soft, nondistended, nontender upright but then lying flat produces her pain, somewhat tender to the RLQ. There are no masses palpable.  Extremities: no edema Lymphadenopathy: No cervical adenopathy noted. Neurological: Alert and oriented to person place and time. Skin: Skin is warm and dry. No rashes noted. Psychiatric: Normal mood and affect. Behavior is normal.   ASSESSMENT: 81 y.o. female here for assessment of the following  1. Right lower quadrant abdominal pain   2. Constipation, unspecified constipation type   3. Long-term current use of proton pump inhibitor therapy   4. Family history of colon cancer   5. Antiplatelet or antithrombotic long-term use    Ongoing mild right lower sided discomfort as  outlined above.  Interestingly she does not feel this when standing however when recumbent, especially on the exam table today she does have some tenderness and this bothers her.  Given positional changes could be musculoskeletal however hard to say for sure.  Given this is a change for her and continues to bother her, I offered her a CT scan abdomen pelvis to make sure no concerning process going on.  Following discussion of this she wants to proceed with it.  She has some mild constipation, does not appear to be related to her abdominal pain, she can use MiraLAX as needed.  Of note she is taking both Protonix and Pepcid every day.  She states she had some reflux in the past but no longer bothering her.  She cannot clarify how long she has been on this.  Denies any history of GI bleeding or chronic NSAID use.  Given she has no symptoms of reflux that bother her I think okay to hold the Pepcid for now and see how she does.  If she continues to do well can then taper down off Protonix and use as needed.  She is agreeable to this, wants to minimize her medication use.  Otherwise we discussed if she wanted to have any further surveillance colonoscopy.  Her mother did have colon cancer at a young age and she has a history of polyps.  She had no high risk lesions on her last exam.  She is intervally had a stroke last year and on Plavix, she is turning 42 in January.  At this point time in her life I feel the risks of further colonoscopy likely outweigh the benefits.  She feels the same way and does not want to have any more colonoscopy exams.  We will await results of her CT scan to make sure no concerning process otherwise.   PLAN: - CT abdomen / pelvis without contrast - trial of topical muscle rub in the  interim to abdominal wall for possible musculoskeletal pain - MIralax PRN for constipation - samples given - stop pepcid - taper down on protonix if she does well off pepcid, then use PRN - discussed  colonoscopy in light of her family history and history of CVA / plavix. No high risk lesions on her last exam, she wishes to forgo further exams due to age and comorbidities which I think is reasonable  Jolly Mango, MD [facility]  CC: Carollee Herter, Alferd Apa, *

## 2022-08-12 ENCOUNTER — Ambulatory Visit (HOSPITAL_COMMUNITY)
Admission: RE | Admit: 2022-08-12 | Discharge: 2022-08-12 | Disposition: A | Discharge status: Home / Self Care | Payer: Medicare HMO | Source: Ambulatory Visit | Attending: Gastroenterology | Admitting: Gastroenterology

## 2022-08-12 DIAGNOSIS — K5900 Constipation, unspecified: Secondary | ICD-10-CM | POA: Insufficient documentation

## 2022-08-12 DIAGNOSIS — R1031 Right lower quadrant pain: Secondary | ICD-10-CM | POA: Diagnosis not present

## 2022-08-12 DIAGNOSIS — Z800 Family history of malignant neoplasm of digestive organs: Secondary | ICD-10-CM | POA: Insufficient documentation

## 2022-08-12 DIAGNOSIS — Z7902 Long term (current) use of antithrombotics/antiplatelets: Secondary | ICD-10-CM | POA: Insufficient documentation

## 2022-08-12 DIAGNOSIS — R109 Unspecified abdominal pain: Secondary | ICD-10-CM | POA: Diagnosis not present

## 2022-08-15 ENCOUNTER — Encounter: Payer: Medicare HMO | Admitting: Family Medicine

## 2022-08-19 ENCOUNTER — Ambulatory Visit (INDEPENDENT_AMBULATORY_CARE_PROVIDER_SITE_OTHER): Payer: Medicare HMO | Admitting: Medical

## 2022-08-19 ENCOUNTER — Encounter: Payer: Self-pay | Admitting: Medical

## 2022-08-19 VITALS — BP 132/65 | HR 72 | Temp 98.2°F | Resp 18 | Ht 64.0 in | Wt 123.0 lb

## 2022-08-19 DIAGNOSIS — E1169 Type 2 diabetes mellitus with other specified complication: Secondary | ICD-10-CM

## 2022-08-19 DIAGNOSIS — K219 Gastro-esophageal reflux disease without esophagitis: Secondary | ICD-10-CM

## 2022-08-19 DIAGNOSIS — E1159 Type 2 diabetes mellitus with other circulatory complications: Secondary | ICD-10-CM

## 2022-08-19 DIAGNOSIS — E785 Hyperlipidemia, unspecified: Secondary | ICD-10-CM

## 2022-08-19 DIAGNOSIS — E1165 Type 2 diabetes mellitus with hyperglycemia: Secondary | ICD-10-CM | POA: Diagnosis not present

## 2022-08-19 DIAGNOSIS — I152 Hypertension secondary to endocrine disorders: Secondary | ICD-10-CM

## 2022-08-19 LAB — COMPREHENSIVE METABOLIC PANEL
ALT: 21 U/L (ref 0–35)
AST: 16 U/L (ref 0–37)
Albumin: 4 g/dL (ref 3.5–5.2)
Alkaline Phosphatase: 59 U/L (ref 39–117)
BUN: 24 mg/dL — ABNORMAL HIGH (ref 6–23)
CO2: 23 mEq/L (ref 19–32)
Calcium: 9.3 mg/dL (ref 8.4–10.5)
Chloride: 111 mEq/L (ref 96–112)
Creatinine, Ser: 1.76 mg/dL — ABNORMAL HIGH (ref 0.40–1.20)
GFR: 26.93 mL/min — ABNORMAL LOW (ref >60.00)
Glucose, Bld: 293 mg/dL — ABNORMAL HIGH (ref 70–99)
Potassium: 4.8 mEq/L (ref 3.5–5.1)
Sodium: 142 mEq/L (ref 135–145)
Total Bilirubin: 0.6 mg/dL (ref 0.2–1.2)
Total Protein: 6.4 g/dL (ref 6.0–8.3)

## 2022-08-19 NOTE — Addendum Note (Signed)
Addended by: Anabel Halon on: 08/19/2022 11:03 AM   Modules accepted: Orders

## 2022-08-19 NOTE — Progress Notes (Signed)
Subjective:    Patient ID: Brandi Mccormick, female    DOB: 07/16/1941, 81 y.o.   MRN: [id number]  HPI  Pt in for check on chronic med problems.  Htn- bp controlled on amlodipine 10 mg daily, lisinopril 40 mg daily and toprol xl 25 mg daily.  Diabetes- last A1c was 8.4. pt is on glucotrol xl 5 mg daily. Pt states was told to cut back on soda and carbohdyrates. Pt sees Dr. Kelton Pillar.  High cholesterol- pt is on atorvastain 80 mg daily and zetia 10 mg daily.  Gerd- on protonix. Symptoms controlled.    Hx of stroke-  pt continues plavix.    Review of Systems  Constitutional:  Negative for chills and fatigue.  Respiratory:  Negative for wheezing.   Cardiovascular:  Negative for chest pain.  Gastrointestinal:  Negative for blood in stool.  Genitourinary:  Negative for dysuria.  Musculoskeletal:  Negative for back pain.  Skin:  Negative for rash.  Neurological:  Negative for dizziness, weakness and light-headedness.  Hematological:  Does not bruise/bleed easily.  Psychiatric/Behavioral:  Negative for behavioral problems and decreased concentration. The patient is not nervous/anxious.     Past Medical History:  Diagnosis Date   CVA (cerebral vascular accident) ([place]) 10/2020   Diabetes ([place])    GERD (gastroesophageal reflux disease)    Hyperlipidemia    Hypertension    Hyperthyroidism    Pneumonia    Polycythemia    Shingles      Social History   Socioeconomic History   Marital status: Married    Spouse name: Not on file   Number of children: Not on file   Years of education: Not on file   Highest education level: Not on file  Occupational History   Occupation: [employer]--    Comment: retired  Tobacco Use   Smoking status: Never   Smokeless tobacco: Never  Vaping Use   Vaping Use: Never used  Substance and Sexual Activity   Alcohol use: Yes    Alcohol/week: 0.0 standard drinks of alcohol    Comment: occasional wine cooler   Drug use: No   Sexual activity: Not  Currently    Partners: Male  Other Topics Concern   Not on file  Social History Narrative   Exercise-- yard work,  Walking in [employer]   03/15/21 lives with husband   Social Determinants of Health   Financial Resource Strain: Low Risk  (04/10/2021)   Overall Financial Resource Strain (CARDIA)    Difficulty of Paying Living Expenses: Not hard at all  Food Insecurity: No Food Insecurity (04/10/2021)   Hunger Vital Sign    Worried About Running Out of Food in the Last Year: Never true    [place] in the Last Year: Never true  Transportation Needs: No Transportation Needs (04/10/2021)   PRAPARE - Hydrologist (Medical): No    Lack of Transportation (Non-Medical): No  Physical Activity: Insufficiently Active (04/10/2021)   Exercise Vital Sign    Days of Exercise per Week: 3 days    Minutes of Exercise per Session: 30 min  Stress: No Stress Concern Present (04/10/2021)   [place]    Feeling of Stress : Not at all  Social Connections: Moderately Isolated (04/10/2021)   Social Connection and Isolation Panel [NHANES]    Frequency of Communication with Friends and Family: More than three times a week    Frequency of  Social Gatherings with Friends and Family: More than three times a week    Attends Religious Services: Never    Marine scientist or Organizations: No    Attends Archivist Meetings: Never    Marital Status: Married  Human resources officer Violence: Not At Risk (04/10/2021)   Humiliation, Afraid, Rape, and Kick questionnaire    Fear of Current or Ex-Partner: No    Emotionally Abused: No    Physically Abused: No    Sexually Abused: No    Past Surgical History:  Procedure Laterality Date   CATARACT EXTRACTION Left 2020   COLONOSCOPY     DENTAL SURGERY     IR ANGIO INTRA EXTRACRAN SEL COM CAROTID INNOMINATE BILAT MOD SED  10/09/2020   IR ANGIO VERTEBRAL SEL VERTEBRAL BILAT  MOD SED  10/09/2020    Family History  Problem Relation Age of Onset   Diabetes Mother        and on mother's side of family   Colon cancer Mother    Stroke Mother    Diabetes Father        and on father's side of family   Diabetes Sister    Diabetes Sister    Diabetes Brother    Heart disease Neg Hx    Rectal cancer Neg Hx    Stomach cancer Neg Hx     Allergies  Allergen Reactions   Penicillins Nausea And Vomiting    Has patient had a PCN reaction causing immediate rash, facial/tongue/throat swelling, SOB or lightheadedness with hypotension: no Has patient had a PCN reaction causing severe rash involving mucus membranes or skin necrosis: no Has patient had a PCN reaction that required hospitalization: no Has patient had a PCN reaction occurring within the last 10 years: no If all of the above answers are "NO", then may proceed with Cephalosporin use.     Current Outpatient Medications on File Prior to Visit  Medication Sig Dispense Refill   amLODipine (NORVASC) 10 MG tablet Take 1 tablet by mouth once daily 90 tablet 0   atorvastatin (LIPITOR) 80 MG tablet Take 1 tablet (80 mg total) by mouth daily. 90 tablet 0   clopidogrel (PLAVIX) 75 MG tablet Take 1 tablet by mouth once daily 90 tablet 2   ezetimibe (ZETIA) 10 MG tablet Take 1 tablet (10 mg total) by mouth daily. 30 tablet 2   glipiZIDE (GLUCOTROL XL) 5 MG 24 hr tablet Take 2 tablets (10 mg total) by mouth daily. 180 tablet 3   glucose blood (ONETOUCH VERIO) test strip USE 1 STRIP TO CHECK GLUCOSE TWICE DAILY 200 each 0   hydroxyurea (HYDREA) 500 MG capsule TAKE 1 CAPSULE BY MOUTH ONCE DAILY WITH A MEAL TAKE  THIS  MEDICINE  MON-THURS 90 capsule 0   Lancets (ONETOUCH DELICA PLUS [id number]) MISC USE 1  TO CHECK GLUCOSE TWICE DAILY 200 each 3   lisinopril (ZESTRIL) 40 MG tablet Take 1 tablet by mouth once daily 90 tablet 1   metoprolol succinate (TOPROL-XL) 25 MG 24 hr tablet Take 1 tablet (25 mg total) by mouth daily. 90  tablet 1   pantoprazole (PROTONIX) 40 MG tablet Take 40 mg by mouth as needed.     polyethylene glycol (MIRALAX) 17 g packet Take 17 g by mouth daily as needed. 14 each 0   No current facility-administered medications on file prior to visit.    BP 132/65   Pulse 72   Temp 98.2 F (36.8 C)  Resp 18   Ht '5\' 4"'$  (1.626 m)   Wt 123 lb (55.8 kg)   SpO2 99%   BMI 21.11 kg/m        Objective:   Physical Exam  General Mental Status- Alert. General Appearance- Not in acute distress.   Skin General: Color- Normal Color. Moisture- Normal Moisture.  Neck Carotid Arteries- Normal color. Moisture- Normal Moisture. No carotid bruits. No JVD.  Chest and Lung Exam Auscultation: Breath Sounds:-Normal.  Cardiovascular Auscultation:Rythm- Regular. Murmurs & Other Heart Sounds:Auscultation of the heart reveals- No Murmurs.  Abdomen Inspection:-Inspeection Normal. Palpation/Percussion:Note:No mass. Palpation and Percussion of the abdomen reveal- Non Tender, Non Distended + BS, no rebound or guarding.  Neurologic Cranial Nerve exam:- CN III-XII intact(No nystagmus), symmetric smile. Strength:- 5/5 equal and symmetric strength both upper and lower extremities.       Assessment & Plan:   Patient Instructions  High cholesterol. Last lipid panel in mid summer mild high. Continue atorvastatin and zetia. You ate sausage this morning so get cmp today. On next visit make sure fasting and will check your lipid panel.  Diabetes- continue glipizide, low sugar diet and follow up Dr. Shamleffer/endocrinologist.  Jerrye Bushy- continue protix.  Hx of stroke- continue plavix.  Htn- bp level well controlled. Continue amlodipine 10 mg daily, lisinopril 40 mg daily  and toprol xl 25 mg daily.  Follow up date 3-6 month approximate. (Exact date to be determined after lab review)   Mackie Pai, PA-C

## 2022-08-19 NOTE — Patient Instructions (Addendum)
High cholesterol. Last lipid panel in mid summer mild high. Continue atorvastatin and zetia. You ate sausage this morning so get cmp today. On next visit make sure fasting and will check your lipid panel.  Diabetes- continue glipizide, low sugar diet and follow up Dr. Shamleffer/endocrinologist.  Brandi Mccormick- continue protix.  Hx of stroke- continue plavix.  Htn- bp level well controlled. Continue amlodipine 10 mg daily, lisinopril 40 mg daily  and toprol xl 25 mg daily.  Pt delclines both flu and pneumonia vaccine.  Follow up date 3-6 month approximate. (Exact date to be determined after lab review)

## 2022-08-22 ENCOUNTER — Inpatient Hospital Stay: Payer: Medicare HMO

## 2022-08-22 ENCOUNTER — Inpatient Hospital Stay: Payer: Medicare HMO | Attending: Nurse Practitioner | Admitting: Oncology

## 2022-08-22 VITALS — BP 120/58 | HR 63 | Temp 98.2°F | Resp 18 | Ht 64.0 in | Wt 122.8 lb

## 2022-08-22 DIAGNOSIS — D45 Polycythemia vera: Secondary | ICD-10-CM | POA: Diagnosis not present

## 2022-08-22 DIAGNOSIS — I10 Essential (primary) hypertension: Secondary | ICD-10-CM | POA: Diagnosis not present

## 2022-08-22 DIAGNOSIS — Z8673 Personal history of transient ischemic attack (TIA), and cerebral infarction without residual deficits: Secondary | ICD-10-CM | POA: Insufficient documentation

## 2022-08-22 DIAGNOSIS — E119 Type 2 diabetes mellitus without complications: Secondary | ICD-10-CM | POA: Insufficient documentation

## 2022-08-22 DIAGNOSIS — D471 Chronic myeloproliferative disease: Secondary | ICD-10-CM

## 2022-08-22 LAB — CBC WITH DIFFERENTIAL (CANCER CENTER ONLY)
Abs Immature Granulocytes: 0.02 10*3/uL (ref 0.00–0.07)
Basophils Absolute: 0 10*3/uL (ref 0.0–0.1)
Basophils Relative: 1 %
Eosinophils Absolute: 0.1 10*3/uL (ref 0.0–0.5)
Eosinophils Relative: 1 %
HCT: 36 % (ref 36.0–46.0)
Hemoglobin: 12.2 g/dL (ref 12.0–15.0)
Immature Granulocytes: 0 %
Lymphocytes Relative: 27 %
Lymphs Abs: 1.5 10*3/uL (ref 0.7–4.0)
MCH: 35.4 pg — ABNORMAL HIGH (ref 26.0–34.0)
MCHC: 33.9 g/dL (ref 30.0–36.0)
MCV: 104.3 fL — ABNORMAL HIGH (ref 80.0–100.0)
Monocytes Absolute: 0.5 10*3/uL (ref 0.1–1.0)
Monocytes Relative: 8 %
Neutro Abs: 3.5 10*3/uL (ref 1.7–7.7)
Neutrophils Relative %: 63 %
Platelet Count: 429 10*3/uL — ABNORMAL HIGH (ref 150–400)
RBC: 3.45 MIL/uL — ABNORMAL LOW (ref 3.87–5.11)
RDW: 16.4 % — ABNORMAL HIGH (ref 11.5–15.5)
WBC Count: 5.5 10*3/uL (ref 4.0–10.5)
nRBC: 0 % (ref 0.0–0.2)

## 2022-08-22 NOTE — Progress Notes (Signed)
  [facility] OFFICE PROGRESS NOTE   Diagnosis: Polycythemia vera  INTERVAL HISTORY:   Ms. Winstanley returns as scheduled.  She feels well.  She continues hydroxyurea.  No mouth sores, bleeding, or symptom of thrombosis.  No complaint.  Objective:  Vital signs in last 24 hours:  Blood pressure (!) 120/58, pulse 63, temperature 98.2 F (36.8 C), temperature source Oral, resp. rate 18, height '5\' 4"'$  (1.626 m), weight 122 lb 12.8 oz (55.7 kg), SpO2 100 %.    HEENT: Thrush or ulcers Resp: Lungs clear bilaterally Cardio: Regular rate and rhythm GI: No hepatosplenomegaly Vascular: No leg edema   Lab Results:  Lab Results  Component Value Date   WBC 5.5 08/22/2022   HGB 12.2 08/22/2022   HCT 36.0 08/22/2022   MCV 104.3 (H) 08/22/2022   PLT 429 (H) 08/22/2022   NEUTROABS 3.5 08/22/2022    CMP  Lab Results  Component Value Date   NA 142 08/19/2022   K 4.8 08/19/2022   CL 111 08/19/2022   CO2 23 08/19/2022   GLUCOSE 293 (H) 08/19/2022   BUN 24 (H) 08/19/2022   CREATININE 1.76 (H) 08/19/2022   CALCIUM 9.3 08/19/2022   PROT 6.4 08/19/2022   ALBUMIN 4.0 08/19/2022   AST 16 08/19/2022   ALT 21 08/19/2022   ALKPHOS 59 08/19/2022   BILITOT 0.6 08/19/2022   GFRNONAA 22 (L) 09/18/2021   GFRAA 24 (L) 06/10/2019     Medications: I have reviewed the patient's current medications.   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 Endres is stable from a hematologic standpoint.  She will continue hydroxyurea at the current dose.  She will return for lab visit in 2 months and an office visit in 4 months.  Betsy Coder, MD  08/22/2022  11:32 AM

## 2022-08-26 ENCOUNTER — Other Ambulatory Visit: Payer: Self-pay | Admitting: Family Medicine

## 2022-09-17 ENCOUNTER — Encounter: Payer: Self-pay | Admitting: Podiatry

## 2022-09-17 ENCOUNTER — Ambulatory Visit: Payer: Medicare HMO | Admitting: Podiatry

## 2022-09-17 VITALS — BP 142/63

## 2022-09-17 DIAGNOSIS — M2141 Flat foot [pes planus] (acquired), right foot: Secondary | ICD-10-CM

## 2022-09-17 DIAGNOSIS — M2042 Other hammer toe(s) (acquired), left foot: Secondary | ICD-10-CM

## 2022-09-17 DIAGNOSIS — M2041 Other hammer toe(s) (acquired), right foot: Secondary | ICD-10-CM

## 2022-09-17 DIAGNOSIS — E119 Type 2 diabetes mellitus without complications: Secondary | ICD-10-CM

## 2022-09-17 DIAGNOSIS — M2142 Flat foot [pes planus] (acquired), left foot: Secondary | ICD-10-CM | POA: Diagnosis not present

## 2022-09-17 DIAGNOSIS — B351 Tinea unguium: Secondary | ICD-10-CM

## 2022-09-17 NOTE — Progress Notes (Signed)
ANNUAL DIABETIC FOOT EXAM  Subjective: Brandi Mccormick presents today for annual diabetic foot examination.  Chief Complaint  Patient presents with   Nail Problem    DFC BS-did not check A1C-do not know PCP-[place] PCP VST-07/2022   Patient confirms h/o diabetes.  Patient denies any h/o foot wounds.  Patient denies any numbness, tingling, burning, or pins/needle sensation in feet.  Risk factors: diabetes, h/o CVA, HTN, hyperlipidemia.  Brandi Held, DO is patient's PCP.   Past Medical History:  Diagnosis Date   CVA (cerebral vascular accident) ([place]) 10/2020   Diabetes ([place])    GERD (gastroesophageal reflux disease)    Hyperlipidemia    Hypertension    Hyperthyroidism    Pneumonia    Polycythemia    Shingles    Patient Active Problem List   Diagnosis Date Noted   Myelodysplastic syndrome, unspecified ([place]) 08/13/2021   Vaginal discharge 08/13/2021   Abdominal pressure 08/13/2021   Anemia 02/09/2021   Acute CVA (cerebrovascular accident) ([place]) 10/06/2020   Preventative health care 05/06/2019   Uncontrolled type 2 diabetes mellitus with hyperglycemia ([place]) 05/06/2019   Dizziness 11/05/2018   DM (diabetes mellitus) type II uncontrolled, periph vascular disorder 09/11/2018   Hyperlipidemia associated with type 2 diabetes mellitus ([place]) 04/02/2018   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   Pneumonia    Essential hypertension 01/14/2017   Hyperlipidemia 11/15/2016   Polycythemia vera ([place]) 07/01/2014   Protein-calorie malnutrition, severe ([place]) 06/25/2014   Protein-calorie malnutrition ([place]) 06/24/2014   Hypokalemia 06/24/2014   CAP (community acquired pneumonia) 06/24/2014   H/O hyperthyroidism 12/21/2013   Rash 01/24/2013   Past Surgical History:  Procedure Laterality Date   CATARACT EXTRACTION Left 2020   COLONOSCOPY     DENTAL SURGERY     IR ANGIO INTRA EXTRACRAN SEL COM CAROTID INNOMINATE BILAT MOD SED  10/09/2020   IR ANGIO VERTEBRAL SEL VERTEBRAL  BILAT MOD SED  10/09/2020   Current Outpatient Medications on File Prior to Visit  Medication Sig Dispense Refill   amLODipine (NORVASC) 10 MG tablet Take 1 tablet by mouth once daily 90 tablet 0   atorvastatin (LIPITOR) 80 MG tablet Take 1 tablet (80 mg total) by mouth daily. 90 tablet 0   clopidogrel (PLAVIX) 75 MG tablet Take 1 tablet by mouth once daily 90 tablet 2   ezetimibe (ZETIA) 10 MG tablet Take 1 tablet (10 mg total) by mouth daily. 30 tablet 2   glipiZIDE (GLUCOTROL XL) 5 MG 24 hr tablet Take 2 tablets (10 mg total) by mouth daily. 180 tablet 3   glucose blood (ONETOUCH VERIO) test strip USE 1 STRIP TO CHECK GLUCOSE TWICE DAILY 200 each 0   hydroxyurea (HYDREA) 500 MG capsule TAKE 1 CAPSULE BY MOUTH ONCE DAILY WITH A MEAL TAKE  THIS  MEDICINE  MON-THURS 90 capsule 0   Lancets (ONETOUCH DELICA PLUS [id number]) MISC USE 1  TO CHECK GLUCOSE TWICE DAILY 200 each 3   lisinopril (ZESTRIL) 40 MG tablet Take 1 tablet by mouth once daily 90 tablet 1   metoprolol succinate (TOPROL-XL) 25 MG 24 hr tablet Take 1 tablet (25 mg total) by mouth daily. 90 tablet 1   pantoprazole (PROTONIX) 40 MG tablet Take 1 tablet by mouth once daily 90 tablet 1   polyethylene glycol (MIRALAX) 17 g packet Take 17 g by mouth daily as needed. 14 each 0   No current facility-administered medications on file prior to visit.    Allergies  Allergen Reactions   Penicillins  Nausea And Vomiting    Has patient had a PCN reaction causing immediate rash, facial/tongue/throat swelling, SOB or lightheadedness with hypotension: no Has patient had a PCN reaction causing severe rash involving mucus membranes or skin necrosis: no Has patient had a PCN reaction that required hospitalization: no Has patient had a PCN reaction occurring within the last 10 years: no If all of the above answers are "NO", then may proceed with Cephalosporin use.    Social History   Occupational History   Occupation: ABB--    Comment: retired   Tobacco Use   Smoking status: Never   Smokeless tobacco: Never  Vaping Use   Vaping Use: Never used  Substance and Sexual Activity   Alcohol use: Yes    Alcohol/week: 0.0 standard drinks of alcohol    Comment: occasional wine cooler   Drug use: No   Sexual activity: Not Currently    Partners: Male   Family History  Problem Relation Age of Onset   Diabetes Mother        and on mother's side of family   Colon cancer Mother    Stroke Mother    Diabetes Father        and on father's side of family   Diabetes Sister    Diabetes Sister    Diabetes Brother    Heart disease Neg Hx    Rectal cancer Neg Hx    Stomach cancer Neg Hx    Immunization History  Administered Date(s) Administered   PFIZER(Purple Top)SARS-COV-2 Vaccination 10/31/2019, 11/24/2019, 08/01/2020   Pneumococcal Polysaccharide-23 11/16/2013     Review of Systems: Negative except as noted in the HPI.   Objective: Vitals:   09/17/22 1155  BP: (!) 142/63    Brandi Mccormick is a pleasant 82 y.o. female in NAD. AAO X 3.  Vascular Examination: CFT <3 seconds b/l. DP/PT pulses faintly palpable b/l. Skin temperature gradient warm to warm b/l. [name] No cyanosis or clubbing noted b/l. Pedal hair absent.   Neurological Examination: Sensation grossly intact b/l with 10 gram monofilament. Vibratory sensation intact b/l.   Dermatological Examination: Pedal skin warm and supple b/l. Toenails 1-5 b/l thick, discolored, elongated with subungual debris and [name]  No open wounds b/l LE. No interdigital macerations noted b/l LE.  Musculoskeletal Examination: Muscle strength 5/5 to b/l LE. Hammertoe deformity noted 2-5 b/l. Pes planus deformity noted bilateral LE.  Radiographs: None  Last A1c:      Latest Ref Rng & Units 06/27/2022   10:16 AM 02/11/2022   10:11 AM 12/25/2021   10:00 AM  Hemoglobin A1C  Hemoglobin-A1c 4.0 - 5.6 % 8.4  7.2  7.1     Footwear Assessment: Does the patient wear appropriate shoes? Yes. Does the patient need inserts/orthotics? Yes.  ADA Risk Categorization: Low Risk :  Patient has all of the following: Intact protective sensation No prior foot ulcer  No severe deformity Pedal pulses present  Assessment: 1. Mccormick due to onychomycosis of toenails of both feet   2. Acquired hammertoes of both feet   3. Pes planus of both feet   4. Controlled type 2 diabetes mellitus without complication, without long-term current use of insulin ([place])   5. Encounter for diabetic foot exam ([place])     Plan: No orders of the defined types were placed in this encounter.  -Patient was evaluated and treated. All patient's and/or POA's questions/concerns answered on today's visit. -  Diabetic foot examination performed today. -Stressed the importance of good glycemic control and the detriment of not  controlling glucose levels in relation to the foot. -Continue foot and shoe inspections daily. Monitor blood glucose per PCP/Endocrinologist's recommendations. -Continue supportive shoe gear daily. -Mycotic toenails 1-5 bilaterally were debrided in length and girth with sterile nail nippers and dremel without incident. -Patient/POA to call should there be question/concern in the interim. Return in about 3 months (around 12/17/2022).  Marzetta Board, DPM

## 2022-10-09 ENCOUNTER — Other Ambulatory Visit: Payer: Self-pay | Admitting: Internal Medicine

## 2022-10-09 ENCOUNTER — Other Ambulatory Visit: Payer: Self-pay | Admitting: Family Medicine

## 2022-10-09 DIAGNOSIS — E1165 Type 2 diabetes mellitus with hyperglycemia: Secondary | ICD-10-CM

## 2022-10-18 ENCOUNTER — Other Ambulatory Visit: Payer: Self-pay | Admitting: Family Medicine

## 2022-10-18 DIAGNOSIS — I10 Essential (primary) hypertension: Secondary | ICD-10-CM

## 2022-10-23 ENCOUNTER — Inpatient Hospital Stay: Payer: Medicare HMO | Attending: Nurse Practitioner

## 2022-10-23 DIAGNOSIS — I10 Essential (primary) hypertension: Secondary | ICD-10-CM | POA: Insufficient documentation

## 2022-10-23 DIAGNOSIS — E119 Type 2 diabetes mellitus without complications: Secondary | ICD-10-CM | POA: Diagnosis not present

## 2022-10-23 DIAGNOSIS — D471 Chronic myeloproliferative disease: Secondary | ICD-10-CM

## 2022-10-23 DIAGNOSIS — D45 Polycythemia vera: Secondary | ICD-10-CM | POA: Insufficient documentation

## 2022-10-23 LAB — CBC WITH DIFFERENTIAL (CANCER CENTER ONLY)
Abs Immature Granulocytes: 0.03 10*3/uL (ref 0.00–0.07)
Basophils Absolute: 0 10*3/uL (ref 0.0–0.1)
Basophils Relative: 1 %
Eosinophils Absolute: 0.1 10*3/uL (ref 0.0–0.5)
Eosinophils Relative: 2 %
HCT: 38.7 % (ref 36.0–46.0)
Hemoglobin: 13.2 g/dL (ref 12.0–15.0)
Immature Granulocytes: 1 %
Lymphocytes Relative: 23 %
Lymphs Abs: 1.5 10*3/uL (ref 0.7–4.0)
MCH: 34.7 pg — ABNORMAL HIGH (ref 26.0–34.0)
MCHC: 34.1 g/dL (ref 30.0–36.0)
MCV: 101.8 fL — ABNORMAL HIGH (ref 80.0–100.0)
Monocytes Absolute: 0.6 10*3/uL (ref 0.1–1.0)
Monocytes Relative: 9 %
Neutro Abs: 4.4 10*3/uL (ref 1.7–7.7)
Neutrophils Relative %: 64 %
Platelet Count: 527 10*3/uL — ABNORMAL HIGH (ref 150–400)
RBC: 3.8 MIL/uL — ABNORMAL LOW (ref 3.87–5.11)
RDW: 13.3 % (ref 11.5–15.5)
WBC Count: 6.6 10*3/uL (ref 4.0–10.5)
nRBC: 0 % (ref 0.0–0.2)

## 2022-10-25 ENCOUNTER — Telehealth: Payer: Self-pay

## 2022-10-25 NOTE — Telephone Encounter (Signed)
-----   Message from Ladell Pier, MD sent at 10/24/2022  4:44 PM EST ----- Please call patient, continue hydroxyurea at the current dose, will consider increasing the dose if the platelet count remains elevated at the next visit

## 2022-10-25 NOTE — Telephone Encounter (Signed)
Patient gave verbal understanding had no further or concerns at this time.

## 2022-10-28 ENCOUNTER — Ambulatory Visit: Payer: Medicare HMO | Admitting: Internal Medicine

## 2022-10-28 ENCOUNTER — Encounter: Payer: Self-pay | Admitting: Internal Medicine

## 2022-10-28 VITALS — BP 110/76 | HR 72 | Ht 64.0 in | Wt 123.4 lb

## 2022-10-28 DIAGNOSIS — E785 Hyperlipidemia, unspecified: Secondary | ICD-10-CM

## 2022-10-28 DIAGNOSIS — E1169 Type 2 diabetes mellitus with other specified complication: Secondary | ICD-10-CM

## 2022-10-28 DIAGNOSIS — E1165 Type 2 diabetes mellitus with hyperglycemia: Secondary | ICD-10-CM | POA: Diagnosis not present

## 2022-10-28 DIAGNOSIS — Z8639 Personal history of other endocrine, nutritional and metabolic disease: Secondary | ICD-10-CM | POA: Diagnosis not present

## 2022-10-28 LAB — POCT GLYCOSYLATED HEMOGLOBIN (HGB A1C): Hemoglobin A1C: 8.1 % — AB (ref 4.0–5.6)

## 2022-10-28 LAB — TSH: TSH: 0.58 u[IU]/mL (ref 0.35–5.50)

## 2022-10-28 NOTE — Patient Instructions (Signed)
Please continue: - Glipizide XL 10 mg before breakfast  NO JUICE, SWEET TEA AND REGULAR SODAS!  Please return in 4 months with your sugar log.

## 2022-10-28 NOTE — Progress Notes (Addendum)
Patient ID: Brandi Mccormick, female   DOB: 03-15-1941, 82 y.o.   MRN: [id number]  HPI: Brandi Mccormick is a 82 y.o.-year-old female, presenting for f/u for DM2, dx 2013, non-insulin-dependent, uncontrolled, with complications (CVA 10/2020, microalbuminuria) and h/o hyperthyroidism. Last visit 4 mo ago.  Interim history: No increased urination, blurry vision, nausea, chest pain. Before last visit, she stopped Pepsi but was drinking a lot of cranberry juice!  I strongly advised her to stop. She is still drinking juice but not as much.  DM2: Reviewed HbA1c levels: Lab Results  Component Value Date   HGBA1C 8.4 (A) 06/27/2022   HGBA1C 7.2 (H) 02/11/2022   HGBA1C 7.1 (A) 12/25/2021   HGBA1C 7.9 (A) 07/11/2021   HGBA1C 6.9 (A) 03/07/2021   HGBA1C 8.0 (H) 10/07/2020   HGBA1C 7.3 (A) 06/29/2020   HGBA1C 7.2 (A) 02/24/2020   HGBA1C 7.2 (H) 11/04/2019   HGBA1C 6.6 (H) 05/06/2019   HGBA1C 6.1 12/21/2018   HGBA1C 8.3 (H) 09/11/2018   HGBA1C 6.9 (A) 06/04/2018   HGBA1C 7.6 (A) 02/02/2018   HGBA1C 7.4 10/03/2017  07/11/2021: HbA1c calculated from fructosamine is 9.5%, even higher than the directly measured HbA1c. 11/02/2020: HbA1c calculated from fructosamine: 10%, even higher than the directly measured HbA1c.  Pt is on a regimen of: - Glipizide XL 5 mg in a.m.  >> 10 (2x 5 mg) mg in a.m.  We tried Tradjenta 5 mg daily in am - too expensive. She was previously on Janumet XR and Metformin ER, but this was stopped 05/2019 due to declining kidney function.  Pt checks her sugars once a day per review of her  log: - am:  85-130, 135 >> 81-125, 151 >> 131-156 >> 90, 99-141, 144 - 2h after b'fast:  138-144 >> 124-136, 146 >> 124 >> 151 >> 140, 152 - before lunch: 115-138 >> 123, 137 >> n/c >> 133, 135 >> 140 - 2h after lunch:  138, 148 >> 113-147 >> 137 >> 140-153 >> 110 - before dinner: 113, 138 >> n/c >> 98 >> 109-125 >> 87-136, 140 - 2h after dinner: 117-147, 153 >> 109-130 >> 145, 150 >>  136-144 - bedtime:  n/c >> 99-143 >> 123 >> 99 >> 108, 138 >> 78-133 - nighttime: n/c >> 161 >> n/c Lowest sugar: 85 >> 81 >> 78; it is unclear at which level she has hypoglycemia awareness. Highest sugar: 156 >> 309 ([facility]; since then 151) >> 152  Meter: OneTouch Verio.  Pt's meals are: - Breakfast: egg, coffee, juice - Lunch: No Lunch - Dinner: meat + vegetables >> late dinner during the weekend as her son is cooking and brings her food - Snacks: 1-2  -+ CKD (FSGS-sees nephrology), last BUN/creatinine:  Lab Results  Component Value Date   BUN 24 (H) 08/19/2022   CREATININE 1.76 (H) 08/19/2022   Lab Results  Component Value Date   GFRAA 24 (L) 06/10/2019   GFRAA 27 (L) 04/28/2019   GFRAA 43 (L) 07/27/2018   GFRAA >60 06/26/2018   GFRAA 38 (L) 06/02/2018   GFRAA 82 06/28/2016   GFRAA >90 06/25/2014   GFRAA >90 06/24/2014   GFRAA >90 10/23/2013  12/21/2018: GFR 28.9   She has MAU: Lab Results  Component Value Date   MICRALBCREAT 6.3 02/11/2022   MICRALBCREAT 43.8 (H) 11/04/2019   MICRALBCREAT 77.6 (H) 05/06/2019   MICRALBCREAT 422.7 (H) 04/02/2018   MICRALBCREAT 225.8 (H) 11/15/2016   MICRALBCREAT 48.8 (H) 06/28/2016   MICRALBCREAT 9.9 02/21/2014  MICRALBCREAT 8.8 11/16/2013  On lisinopril 40.  -+ CKD; last set of lipids: Lab Results  Component Value Date   CHOL 201 (H) 02/11/2022   HDL 58.00 02/11/2022   LDLCALC 123 (H) 02/11/2022   TRIG 98.0 02/11/2022   CHOLHDL 3 02/11/2022  Prev. on Simvastatin >> off in 06/2016 b/c fatigue >> pravastatin 80 mg daily in 12/2016 >> Crestor 40 >> Lipitor 80. Zetia was added.  - no numbness and tingling in her feet.  + Onychomycosis.  Latest foot exam: 09/17/2022- Dr. Wallis Bamberg.  - Last eye exam in 09/2019: No DR reportedly; Had OS cataract sx.   H/o hyperthyroidism - previously treated with medication, more than 20 years ago.  No other history available.  Pt denies: - feeling nodules in neck - hoarseness -  dysphagia - choking  Her TFTs were normal: Lab Results  Component Value Date   TSH 0.72 07/11/2021   TSH 0.37 11/02/2020   TSH 1.12 05/12/2019   TSH 0.635 06/26/2018   TSH 1.11 04/02/2018   TSH 1.13 07/03/2017   TSH 1.12 06/28/2016   TSH 1.29 06/23/2015   TSH 1.44 10/18/2014   TSH 0.93 08/15/2014   FREET4 1.04 07/11/2021   FREET4 0.85 05/12/2019   FREET4 0.93 07/03/2017   FREET4 0.96 06/28/2016   FREET4 0.91 08/15/2014   FREET4 1.00 02/17/2014   FREET4 0.97 12/20/2013   T3FREE 2.6 07/11/2021   T3FREE 2.7 05/12/2019   T3FREE 3.2 07/03/2017   T3FREE 3.1 06/28/2016   T3FREE 3.2 08/15/2014   T3FREE 2.6 02/17/2014   T3FREE 2.9 12/20/2013   She had a CVA on 10/06/2020 >> had  PT. She also has a history of gout. She has chronic myeloproliferative ds.  She is on hydroxyurea. 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  ROS: + see HPI  I reviewed pt's medications, allergies, PMH, social hx, family hx, and changes were documented in the history of present illness. Otherwise, unchanged from my initial visit note.  Past Medical History:  Diagnosis Date   CVA (cerebral vascular accident) ([facility]) 10/2020   Diabetes ([facility])    GERD (gastroesophageal reflux disease)    Hyperlipidemia    Hypertension    Hyperthyroidism    Pneumonia    Polycythemia    Shingles    Past Surgical History:  Procedure Laterality Date   CATARACT EXTRACTION Left 2020   COLONOSCOPY     DENTAL SURGERY     IR ANGIO INTRA EXTRACRAN SEL COM CAROTID INNOMINATE BILAT MOD SED  10/09/2020   IR ANGIO VERTEBRAL SEL VERTEBRAL BILAT MOD SED  10/09/2020   Social History   Socioeconomic History   Marital status: Married    Spouse name: Not on file   Number of children: Not on file   Years of education: Not on file   Highest education level: Not on file  Occupational History   Occupation: ABB--    Comment: retired  Tobacco Use   Smoking  status: Never   Smokeless tobacco: Never  Vaping Use   Vaping Use: Never used  Substance and Sexual Activity   Alcohol use: Yes    Alcohol/week: 0.0 standard drinks of alcohol    Comment: occasional wine cooler   Drug use: No   Sexual activity: Not Currently    Partners: Male  Other Topics Concern   Not on file  Social History Narrative   Exercise-- yard work,  Walking  in [employer]   03/15/21 lives with husband   Social Determinants of Health   Financial Resource Strain: Low Risk  (04/10/2021)   Overall Financial Resource Strain (CARDIA)    Difficulty of Paying Living Expenses: Not hard at all  Food Insecurity: No Food Insecurity (04/10/2021)   Hunger Vital Sign    Worried About Running Out of Food in the Last Year: Never true    Ran Out of Food in the Last Year: Never true  Transportation Needs: No Transportation Needs (04/10/2021)   PRAPARE - Administrator, Civil Service (Medical): No    Lack of Transportation (Non-Medical): No  Physical Activity: Insufficiently Active (04/10/2021)   Exercise Vital Sign    Days of Exercise per Week: 3 days    Minutes of Exercise per Session: 30 min  Stress: No Stress Concern Present (04/10/2021)   [employer] of Occupational Health - Occupational Stress Questionnaire    Feeling of Stress : Not at all  Social Connections: Moderately Isolated (04/10/2021)   Social Connection and Isolation Panel [NHANES]    Frequency of Communication with Friends and Family: More than three times a week    Frequency of Social Gatherings with Friends and Family: More than three times a week    Attends Religious Services: Never    Database administrator or Organizations: No    Attends Banker Meetings: Never    Marital Status: Married  Catering manager Violence: Not At Risk (04/10/2021)   Humiliation, Afraid, Rape, and Kick questionnaire    Fear of Current or Ex-Partner: No    Emotionally Abused: No    Physically Abused: No    Sexually  Abused: No   Current Outpatient Medications on File Prior to Visit  Medication Sig Dispense Refill   amLODipine (NORVASC) 10 MG tablet Take 1 tablet by mouth once daily 90 tablet 0   atorvastatin (LIPITOR) 80 MG tablet Take 1 tablet (80 mg total) by mouth daily. 90 tablet 0   clopidogrel (PLAVIX) 75 MG tablet Take 1 tablet by mouth once daily 90 tablet 2   ezetimibe (ZETIA) 10 MG tablet Take 1 tablet (10 mg total) by mouth daily. 30 tablet 2   glipiZIDE (GLUCOTROL XL) 5 MG 24 hr tablet Take 2 tablets (10 mg total) by mouth daily. 180 tablet 3   hydroxyurea (HYDREA) 500 MG capsule TAKE 1 CAPSULE BY MOUTH ONCE DAILY WITH A MEAL TAKE  THIS  MEDICINE  MON-THURS 90 capsule 0   Lancets (ONETOUCH DELICA PLUS [id number]) MISC USE 1  TO CHECK GLUCOSE TWICE DAILY 200 each 3   lisinopril (ZESTRIL) 40 MG tablet Take 1 tablet by mouth once daily 90 tablet 0   metoprolol succinate (TOPROL-XL) 25 MG 24 hr tablet Take 1 tablet (25 mg total) by mouth daily. 90 tablet 1   ONETOUCH VERIO test strip USE 1 STRIP TO CHECK GLUCOSE TWICE DAILY 200 each 0   pantoprazole (PROTONIX) 40 MG tablet Take 1 tablet by mouth once daily 90 tablet 1   polyethylene glycol (MIRALAX) 17 g packet Take 17 g by mouth daily as needed. 14 each 0   No current facility-administered medications on file prior to visit.   Allergies  Allergen Reactions   Penicillins Nausea And Vomiting    Has patient had a PCN reaction causing immediate rash, facial/tongue/throat swelling, SOB or lightheadedness with hypotension: no Has patient had a PCN reaction causing severe rash involving mucus membranes or skin necrosis: no Has patient  had a PCN reaction that required hospitalization: no Has patient had a PCN reaction occurring within the last 10 years: no If all of the above answers are "NO", then may proceed with Cephalosporin use.    Family History  Problem Relation Age of Onset   Diabetes Mother        and on mother's side of family   Colon  cancer Mother    Stroke Mother    Diabetes Father        and on father's side of family   Diabetes Sister    Diabetes Sister    Diabetes Brother    Heart disease Neg Hx    Rectal cancer Neg Hx    Stomach cancer Neg Hx    PE: BP 110/76 (BP Location: Left Arm, Patient Position: Sitting, Cuff Size: Normal)   Pulse 72   Ht 5\' 4"  (1.626 m)   Wt 123 lb 6.4 oz (56 kg)   SpO2 96%   BMI 21.18 kg/m   Wt Readings from Last 3 Encounters:  10/28/22 123 lb 6.4 oz (56 kg)  08/22/22 122 lb 12.8 oz (55.7 kg)  08/19/22 123 lb (55.8 kg)   Constitutional:  Normal weight, in NAD Eyes: PERRLA, EOMI, no exophthalmos ENT: no thyromegaly, no cervical lymphadenopathy Cardiovascular: RRR, No MRG Respiratory: CTA B Musculoskeletal: no deformities Skin:  no rashes Neurological: no tremor with outstretched hands  ASSESSMENT: 1. DM2, non-insulin-dependent, uncontrolled, with complications - CVA 10/2020 - MAU  2. H/o Hyperthyroidism - 20 years ago - was on meds, cannot remember name  3. HL  PLAN:  1. Patient with history of uncontrolled type 2 diabetes, on sulfonylurea only.  We had to stop metformin ER due to declining kidney function in the setting of FSGS.  She sees nephrology (Dr. Glenna Fellows).  She had a CVA in 10/2020, but afterwards, she started to improve her diet and sugars improved.  HbA1c decreased to 6.9%.  However, afterwards, she started to drink sweet tea and regular sodas and HbA1c started to increase.  I did advise her to stop these and she did, but unfortunately, she replaced them with cranberry juice.  I strongly advised her not to drink any juice or any other sweet drinks.  At last visit, we did not change the regimen otherwise.  Blood sugars are higher in the morning but mostly in the 140s to 150s and later in the day they were mostly at target. -At today's visit, sugars are still mostly at target.  She does a good job rotating the check times throughout the day.  The highest blood  sugar was 152, but otherwise, sugars appear to be fluctuating within a narrow range in the target interval.  No need to change her regimen for now but I again strongly advised her to stop juice. - I suggested to:  Patient Instructions  Please continue: - Glipizide XL 10 mg before breakfast  NO JUICE, SWEET TEA AND REGULAR SODAS!  Please return in 4 months with your sugar log.   - we checked her HbA1c: 8.1% (much higher than expected from log-will check a fructosamine level today) - advised to check sugars at different times of the day - 1x a day, rotating check times - advised for yearly eye exams >> she is not UTD - return to clinic in 4 months  2. H/o hyperthyroidism -No thyrotoxic complaints -TSH is normal: Lab Results  Component Value Date   TSH 0.72 07/11/2021  - will recheck her TSH  today  3. HL -Reviewed latest lipid panel and LDL was high, above our target of less than 55 due to history of cardiovascular disease: Lab Results  Component Value Date   CHOL 201 (H) 02/11/2022   HDL 58.00 02/11/2022   LDLCALC 123 (H) 02/11/2022   TRIG 98.0 02/11/2022   CHOLHDL 3 02/11/2022  -Previously on Crestor 40 mg daily but was switched to Lipitor 80 mg daily for last visit. Zetia 10 mg daily was added after the above results returned.  Component     Latest Ref Rng 10/28/2022  Hemoglobin A1C     4.0 - 5.6 % 8.1 !   TSH     0.35 - 5.50 uIU/mL 0.58   Fructosamine     205 - 285 umol/L 521 (H)   Thyroid test is normal. HbA1c calculated from fructosamine is 10.4%, which further confounds the picture.  Will give her a call to see how her sugars are doing.  I will again underlined the need to stop sweet drinks.  If sugars are high at home, will need basal insulin.  Carlus Pavlov, MD PhD [facility]

## 2022-10-31 ENCOUNTER — Telehealth: Payer: Self-pay

## 2022-10-31 DIAGNOSIS — E1165 Type 2 diabetes mellitus with hyperglycemia: Secondary | ICD-10-CM

## 2022-10-31 LAB — FRUCTOSAMINE: Fructosamine: 521 umol/L — ABNORMAL HIGH (ref 205–285)

## 2022-10-31 NOTE — Telephone Encounter (Signed)
Pt advised her blood sugars are about 140. I did ask pt pt how long she's had her meter and she advised at least 3 years.

## 2022-10-31 NOTE — Telephone Encounter (Signed)
Pt contacted and advised: Thyroid test is normal. HbA1c calculated from fructosamine is 10.4%, which is a higher than the HbA1c directly measured.  I am perplexed by this.  Please give her a call to see how sugars are doing at home.  Please underline the need to stop sweet drinks.  If sugars are high at home, will need basal insulin.  Please let me know.

## 2022-11-01 MED ORDER — ONETOUCH VERIO W/DEVICE KIT: PACK | 0 refills | Status: DC

## 2022-11-01 MED ORDER — ONETOUCH VERIO VI STRP: ORAL_STRIP | 0 refills | Status: DC

## 2022-11-01 NOTE — Telephone Encounter (Signed)
Advised rx for new meter and test strips sent to pharmacy.

## 2022-11-12 ENCOUNTER — Other Ambulatory Visit: Payer: Self-pay | Admitting: Family Medicine

## 2022-11-15 ENCOUNTER — Other Ambulatory Visit: Payer: Self-pay | Admitting: Family Medicine

## 2022-11-17 ENCOUNTER — Other Ambulatory Visit: Payer: Self-pay | Admitting: Family Medicine

## 2022-12-07 ENCOUNTER — Other Ambulatory Visit: Payer: Self-pay | Admitting: Oncology

## 2022-12-07 DIAGNOSIS — D471 Chronic myeloproliferative disease: Secondary | ICD-10-CM

## 2022-12-20 ENCOUNTER — Inpatient Hospital Stay: Payer: Medicare HMO

## 2022-12-20 ENCOUNTER — Inpatient Hospital Stay: Payer: Medicare HMO | Attending: Nurse Practitioner | Admitting: Oncology

## 2022-12-20 VITALS — BP 140/69 | HR 69 | Temp 98.1°F | Resp 18 | Ht 64.0 in | Wt 122.3 lb

## 2022-12-20 DIAGNOSIS — I10 Essential (primary) hypertension: Secondary | ICD-10-CM | POA: Diagnosis not present

## 2022-12-20 DIAGNOSIS — D45 Polycythemia vera: Secondary | ICD-10-CM | POA: Diagnosis not present

## 2022-12-20 DIAGNOSIS — D471 Chronic myeloproliferative disease: Secondary | ICD-10-CM | POA: Diagnosis not present

## 2022-12-20 DIAGNOSIS — E119 Type 2 diabetes mellitus without complications: Secondary | ICD-10-CM | POA: Insufficient documentation

## 2022-12-20 DIAGNOSIS — Z8673 Personal history of transient ischemic attack (TIA), and cerebral infarction without residual deficits: Secondary | ICD-10-CM | POA: Insufficient documentation

## 2022-12-20 LAB — CBC WITH DIFFERENTIAL (CANCER CENTER ONLY)
Abs Immature Granulocytes: 0.01 10*3/uL (ref 0.00–0.07)
Basophils Absolute: 0 10*3/uL (ref 0.0–0.1)
Basophils Relative: 1 %
Eosinophils Absolute: 0.1 10*3/uL (ref 0.0–0.5)
Eosinophils Relative: 2 %
HCT: 37.2 % (ref 36.0–46.0)
Hemoglobin: 12.4 g/dL (ref 12.0–15.0)
Immature Granulocytes: 0 %
Lymphocytes Relative: 32 %
Lymphs Abs: 1.5 10*3/uL (ref 0.7–4.0)
MCH: 32.8 pg (ref 26.0–34.0)
MCHC: 33.3 g/dL (ref 30.0–36.0)
MCV: 98.4 fL (ref 80.0–100.0)
Monocytes Absolute: 0.5 10*3/uL (ref 0.1–1.0)
Monocytes Relative: 12 %
Neutro Abs: 2.5 10*3/uL (ref 1.7–7.7)
Neutrophils Relative %: 53 %
Platelet Count: 327 10*3/uL (ref 150–400)
RBC: 3.78 MIL/uL — ABNORMAL LOW (ref 3.87–5.11)
RDW: 15 % (ref 11.5–15.5)
WBC Count: 4.6 10*3/uL (ref 4.0–10.5)
nRBC: 0 % (ref 0.0–0.2)

## 2022-12-20 NOTE — Progress Notes (Signed)
  [facility] OFFICE PROGRESS NOTE   Diagnosis: Polycythemia vera  INTERVAL HISTORY:   Brandi Mccormick returns as scheduled.  She continues hydroxyurea.  No mouth sores.  No pruritus.  No bleeding or symptom of thrombosis.  No complaint.  Objective:  Vital signs in last 24 hours:  Blood pressure (!) 140/69, pulse 69, temperature 98.1 F (36.7 C), temperature source Oral, resp. rate 18, height  (1.626 m), weight 122 lb 4.8 oz (55.5 kg), SpO2 100 %.    HEENT: No thrush or ulcers Resp: Lungs with scattered coarse end inspiratory rhonchi, no respiratory distress Cardio: Regular rate and rhythm GI: No hepatosplenomegaly Vascular: No leg edema   Lab Results:  Lab Results  Component Value Date   WBC 4.6 12/20/2022   HGB 12.4 12/20/2022   HCT 37.2 12/20/2022   MCV 98.4 12/20/2022   PLT 327 12/20/2022   NEUTROABS 2.5 12/20/2022    CMP  Lab Results  Component Value Date   NA 142 08/19/2022   K 4.8 08/19/2022   CL 111 08/19/2022   CO2 23 08/19/2022   GLUCOSE 293 (H) 08/19/2022   BUN 24 (H) 08/19/2022   CREATININE 1.76 (H) 08/19/2022   CALCIUM 9.3 08/19/2022   PROT 6.4 08/19/2022   ALBUMIN 4.0 08/19/2022   AST 16 08/19/2022   ALT 21 08/19/2022   ALKPHOS 59 08/19/2022   BILITOT 0.6 08/19/2022   GFRNONAA 22 (L) 09/18/2021   GFRAA 24 (L) 06/10/2019     Medications: I have reviewed the patient's current medications.   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  The hemoglobin/hematocrit remain in goal range.  She will continue hydroxyurea at the current dose.  She will return for a lab visit in 3 months and an office visit in 6 months.  Thornton Papas, MD  12/20/2022  10:56 AM

## 2022-12-25 ENCOUNTER — Encounter: Payer: Self-pay | Admitting: Podiatry

## 2022-12-25 ENCOUNTER — Ambulatory Visit: Payer: Medicare HMO | Admitting: Podiatry

## 2022-12-25 DIAGNOSIS — M79674 Pain in right toe(s): Secondary | ICD-10-CM

## 2022-12-25 DIAGNOSIS — M79675 Pain in left toe(s): Secondary | ICD-10-CM | POA: Diagnosis not present

## 2022-12-25 DIAGNOSIS — B351 Tinea unguium: Secondary | ICD-10-CM | POA: Diagnosis not present

## 2022-12-25 NOTE — Progress Notes (Unsigned)
Subjective:   Patient ID: Brandi Mccormick, female   DOB: 82 y.o.   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

## 2022-12-26 ENCOUNTER — Other Ambulatory Visit: Payer: Self-pay | Admitting: Adult Health

## 2023-01-06 ENCOUNTER — Ambulatory Visit (INDEPENDENT_AMBULATORY_CARE_PROVIDER_SITE_OTHER): Payer: Medicare HMO | Admitting: Family Medicine

## 2023-01-06 ENCOUNTER — Encounter: Payer: Self-pay | Admitting: Family Medicine

## 2023-01-06 VITALS — BP 140/70 | HR 68 | Temp 97.8°F | Resp 18 | Ht 64.0 in | Wt 121.6 lb

## 2023-01-06 DIAGNOSIS — E1159 Type 2 diabetes mellitus with other circulatory complications: Secondary | ICD-10-CM | POA: Diagnosis not present

## 2023-01-06 DIAGNOSIS — E1169 Type 2 diabetes mellitus with other specified complication: Secondary | ICD-10-CM | POA: Diagnosis not present

## 2023-01-06 DIAGNOSIS — I639 Cerebral infarction, unspecified: Secondary | ICD-10-CM

## 2023-01-06 DIAGNOSIS — I152 Hypertension secondary to endocrine disorders: Secondary | ICD-10-CM

## 2023-01-06 DIAGNOSIS — E1165 Type 2 diabetes mellitus with hyperglycemia: Secondary | ICD-10-CM

## 2023-01-06 DIAGNOSIS — I10 Essential (primary) hypertension: Secondary | ICD-10-CM

## 2023-01-06 DIAGNOSIS — E785 Hyperlipidemia, unspecified: Secondary | ICD-10-CM | POA: Diagnosis not present

## 2023-01-06 DIAGNOSIS — D469 Myelodysplastic syndrome, unspecified: Secondary | ICD-10-CM

## 2023-01-06 LAB — COMPREHENSIVE METABOLIC PANEL
ALT: 25 U/L (ref 0–35)
AST: 19 U/L (ref 0–37)
Albumin: 4 g/dL (ref 3.5–5.2)
Alkaline Phosphatase: 73 U/L (ref 39–117)
BUN: 30 mg/dL — ABNORMAL HIGH (ref 6–23)
CO2: 24 mEq/L (ref 19–32)
Calcium: 9.5 mg/dL (ref 8.4–10.5)
Chloride: 106 mEq/L (ref 96–112)
Creatinine, Ser: 1.56 mg/dL — ABNORMAL HIGH (ref 0.40–1.20)
GFR: 31.04 mL/min — ABNORMAL LOW (ref >60.00)
Glucose, Bld: 161 mg/dL — ABNORMAL HIGH (ref 70–99)
Potassium: 4.9 mEq/L (ref 3.5–5.1)
Sodium: 139 mEq/L (ref 135–145)
Total Bilirubin: 0.9 mg/dL (ref 0.2–1.2)
Total Protein: 6.7 g/dL (ref 6.0–8.3)

## 2023-01-06 LAB — CBC WITH DIFFERENTIAL/PLATELET
Basophils Absolute: 0 10*3/uL (ref 0.0–0.1)
Basophils Relative: 0.6 % (ref 0.0–3.0)
Eosinophils Absolute: 0.1 10*3/uL (ref 0.0–0.7)
Eosinophils Relative: 1.9 % (ref 0.0–5.0)
HCT: 39 % (ref 36.0–46.0)
Hemoglobin: 13.2 g/dL (ref 12.0–15.0)
Lymphocytes Relative: 30 % (ref 12.0–46.0)
Lymphs Abs: 1.4 10*3/uL (ref 0.7–4.0)
MCHC: 33.9 g/dL (ref 30.0–36.0)
MCV: 99.4 fl (ref 78.0–100.0)
Monocytes Absolute: 0.4 10*3/uL (ref 0.1–1.0)
Monocytes Relative: 7.8 % (ref 3.0–12.0)
Neutro Abs: 2.8 10*3/uL (ref 1.4–7.7)
Neutrophils Relative %: 59.7 % (ref 43.0–77.0)
Platelets: 358 10*3/uL (ref 150.0–400.0)
RBC: 3.92 Mil/uL (ref 3.87–5.11)
RDW: 18.2 % — ABNORMAL HIGH (ref 11.5–15.5)
WBC: 4.6 10*3/uL (ref 4.0–10.5)

## 2023-01-06 LAB — LIPID PANEL
Cholesterol: 166 mg/dL (ref 0–200)
HDL: 55.7 mg/dL (ref >39.00)
LDL Cholesterol: 89 mg/dL (ref 0–99)
NonHDL: 109.81
Total CHOL/HDL Ratio: 3
Triglycerides: 104 mg/dL (ref 0.0–149.0)
VLDL: 20.8 mg/dL (ref 0.0–40.0)

## 2023-01-06 NOTE — Progress Notes (Signed)
Established Patient Office Visit  Subjective   Patient ID: Brandi Mccormick, female    DOB: December 01, 1940  Age: 83 y.o. MRN: [id number]  Chief Complaint  Patient presents with   Hypertension   Hyperlipidemia   Diabetes   Follow-up    HPI    Review of Systems  Constitutional:  Negative for fever and malaise/fatigue.  HENT:  Negative for congestion.   Eyes:  Negative for blurred vision.  Respiratory:  Negative for shortness of breath.   Cardiovascular:  Negative for chest pain, palpitations and leg swelling.  Gastrointestinal:  Negative for abdominal pain, blood in stool and nausea.  Genitourinary:  Negative for dysuria and frequency.  Musculoskeletal:  Negative for falls.  Skin:  Negative for rash.  Neurological:  Negative for dizziness, loss of consciousness and headaches.  Endo/Heme/Allergies:  Negative for environmental allergies.  Psychiatric/Behavioral:  Negative for depression. The patient is not nervous/anxious.       Objective:     BP (!) 140/70 (BP Location: Right Arm, Patient Position: Sitting, Cuff Size: Normal)   Pulse 68   Temp 97.8 F (36.6 C) (Oral)   Resp 18   Ht 5\' 4"  (1.626 m)   Wt 121 lb 9.6 oz (55.2 kg)   SpO2 94%   BMI 20.87 kg/m    Physical Exam Vitals and nursing note reviewed.  Constitutional:      Appearance: She is well-developed.  HENT:     Head: Normocephalic and atraumatic.  Eyes:     Conjunctiva/sclera: Conjunctivae normal.  Neck:     Thyroid: No thyromegaly.     Vascular: No carotid bruit or JVD.  Cardiovascular:     Rate and Rhythm: Normal rate and regular rhythm.     Heart sounds: Normal heart sounds. No murmur heard. Pulmonary:     Effort: Pulmonary effort is normal. No respiratory distress.     Breath sounds: Normal breath sounds. No wheezing or rales.  Chest:     Chest wall: No tenderness.  Musculoskeletal:     Cervical back: Normal range of motion and neck supple.  Neurological:     Mental Status: She is alert  and oriented to person, place, and time.      No results found for any visits on 01/06/23.    The ASCVD Risk score (Arnett DK, et al., 2019) failed to calculate for the following reasons:   The 2019 ASCVD risk score is only valid for ages 14 to 41   The patient has a prior MI or stroke diagnosis    Assessment & Plan:   Problem List Items Addressed This Visit       Unprioritized   Uncontrolled type 2 diabetes mellitus with hyperglycemia (HCC) - Primary    Per endocrine      Relevant Orders   Comprehensive metabolic panel   Myelodysplastic syndrome, unspecified (HCC)    Per hematology      Hyperlipidemia associated with type 2 diabetes mellitus (HCC)    Tolerating statin, encouraged heart healthy diet, avoid trans fats, minimize simple carbs and saturated fats. Increase exercise as tolerated       Relevant Orders   Comprehensive metabolic panel   Lipid panel   CBC with Differential/Platelet   Essential hypertension    Well controlled, no changes to meds. Encouraged heart healthy diet such as the DASH diet and exercise as tolerated.        Other Visit Diagnoses     Hypertension associated with diabetes (HCC)  Relevant Orders   Comprehensive metabolic panel   Lipid panel   Cerebrovascular accident (CVA), unspecified mechanism (HCC)       Relevant Orders   Comprehensive metabolic panel   Lipid panel   CBC with Differential/Platelet       Return in about 6 months (around 07/09/2023), or if symptoms worsen or fail to improve, for annual exam, fasting.    Donato Schultz, DO

## 2023-01-06 NOTE — Patient Instructions (Signed)

## 2023-01-06 NOTE — Assessment & Plan Note (Signed)
Well controlled, no changes to meds. Encouraged heart healthy diet such as the DASH diet and exercise as tolerated.  °

## 2023-01-06 NOTE — Assessment & Plan Note (Signed)
Tolerating statin, encouraged heart healthy diet, avoid trans fats, minimize simple carbs and saturated fats. Increase exercise as tolerated 

## 2023-01-06 NOTE — Assessment & Plan Note (Signed)
Per hematology 

## 2023-01-06 NOTE — Assessment & Plan Note (Signed)
Per endocrine  

## 2023-01-17 ENCOUNTER — Other Ambulatory Visit: Payer: Self-pay | Admitting: Family Medicine

## 2023-01-17 DIAGNOSIS — I10 Essential (primary) hypertension: Secondary | ICD-10-CM

## 2023-02-01 ENCOUNTER — Other Ambulatory Visit: Payer: Self-pay | Admitting: Family Medicine

## 2023-02-04 ENCOUNTER — Other Ambulatory Visit: Payer: Self-pay | Admitting: Family Medicine

## 2023-02-14 ENCOUNTER — Other Ambulatory Visit: Payer: Self-pay | Admitting: Adult Health

## 2023-02-24 ENCOUNTER — Other Ambulatory Visit: Payer: Self-pay | Admitting: Family Medicine

## 2023-02-27 ENCOUNTER — Ambulatory Visit: Payer: Medicare HMO | Admitting: Internal Medicine

## 2023-02-27 ENCOUNTER — Encounter: Payer: Self-pay | Admitting: Internal Medicine

## 2023-02-27 VITALS — BP 130/80 | HR 72 | Ht 64.0 in | Wt 123.0 lb

## 2023-02-27 DIAGNOSIS — E119 Type 2 diabetes mellitus without complications: Secondary | ICD-10-CM

## 2023-02-27 DIAGNOSIS — E785 Hyperlipidemia, unspecified: Secondary | ICD-10-CM | POA: Diagnosis not present

## 2023-02-27 DIAGNOSIS — E1165 Type 2 diabetes mellitus with hyperglycemia: Secondary | ICD-10-CM

## 2023-02-27 DIAGNOSIS — E1169 Type 2 diabetes mellitus with other specified complication: Secondary | ICD-10-CM | POA: Diagnosis not present

## 2023-02-27 DIAGNOSIS — Z8639 Personal history of other endocrine, nutritional and metabolic disease: Secondary | ICD-10-CM

## 2023-02-27 DIAGNOSIS — Z7984 Long term (current) use of oral hypoglycemic drugs: Secondary | ICD-10-CM | POA: Diagnosis not present

## 2023-02-27 LAB — POCT GLYCOSYLATED HEMOGLOBIN (HGB A1C): Hemoglobin A1C: 9 % — AB (ref 4.0–5.6)

## 2023-02-27 MED ORDER — LANTUS SOLOSTAR 100 UNIT/ML ~~LOC~~ SOPN: 8.0000 [IU] | PEN_INJECTOR | Freq: Every day | SUBCUTANEOUS | 99 refills | Status: DC

## 2023-02-27 MED ORDER — INSULIN PEN NEEDLE 32G X 4 MM MISC: 3 refills | Status: DC

## 2023-02-27 NOTE — Patient Instructions (Addendum)
Please continue: - Glipizide XL 10 mg before breakfast  NO JUICE, SWEET TEA AND REGULAR SODAS!  Please add: -  Lantus 8 units at bedtime. If needed, increase the dose by 2 units every 2 days until sugars in am are <140.  Try to get the ReliOn meter from [place].  Schedule a new eye exam.  Please return in 3-4 months with your sugar log.

## 2023-02-27 NOTE — Progress Notes (Signed)
Patient ID: Brandi Mccormick, female   DOB: 10/16/1940, 82 y.o.   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 Last visit 4 mo ago.  Interim history: No increased urination, blurry vision, nausea, chest pain. At last visit she was drinking a lot of cranberry juice and I strongly advised her to stop.  She is only occasionally drinking sweet drinks now.  However, she has frequent Klondike bars. She is fasting today -will check her CBG in office and this was 220.  DM2: Reviewed HbA1c levels: Lab Results  Component Value Date   HGBA1C 8.1 (A) 10/28/2022   HGBA1C 8.4 (A) 06/27/2022   HGBA1C 7.2 (H) 02/11/2022   HGBA1C 7.1 (A) 12/25/2021   HGBA1C 7.9 (A) 07/11/2021   HGBA1C 6.9 (A) 03/07/2021   HGBA1C 8.0 (H) 10/07/2020   HGBA1C 7.3 (A) 06/29/2020   HGBA1C 7.2 (A) 02/24/2020   HGBA1C 7.2 (H) 11/04/2019   HGBA1C 6.6 (H) 05/06/2019   HGBA1C 6.1 12/21/2018   HGBA1C 8.3 (H) 09/11/2018   HGBA1C 6.9 (A) 06/04/2018   HGBA1C 7.6 (A) 02/02/2018  07/11/2021: HbA1c calculated from fructosamine is 9.5%, even higher than the directly measured HbA1c. 11/02/2020: HbA1c calculated from fructosamine: 10%, even higher than the directly measured HbA1c.  Pt is on a regimen of: - Glipizide XL 5 mg in a.m.  >> 10 (2x 5 mg) mg in a.m.  We tried Tradjenta 5 mg daily in am - too expensive. She was previously on Janumet XR and Metformin ER, but this was stopped 05/2019 due to declining kidney function.  Pt checks her sugars once a day per review of her  log: - am:  85-130, 135 >> 81-125, 151 >> 131-156 >> 90, 99-141, 144 >> 100-140s - 2h after b'fast:  138-144 >> 124-136, 146 >> 124 >> 151 >> 140, 152 >> n/c - before lunch: 115-138 >> 123, 137 >> n/c >> 133, 135 >> 140 >> n/c - 2h after lunch:  138, 148 >> 113-147 >> 137 >> 140-153 >> 110 >>  n/c - before dinner: 113, 138 >> n/c >> 98 >> 109-125 >> 87-136, 140 >> 90s-130s - 2h after dinner: 117-147, 153 >> 109-130 >> 145, 150 >> 136-144 >> same - bedtime:  n/c >> 99-143 >> 123 >> 99 >> 108, 138 >> 78-133 >> see above - nighttime: n/c >> 161 >> n/c Lowest sugar: 85 >> 81 >> 78 >> 90s; it is unclear at which level she has hypoglycemia awareness. Highest sugar: 156 >> 309 ([facility]; since then 151) >> 152 >> 200s  Meter: OneTouch Verio.  Pt's meals are: - Breakfast: egg, coffee, juice - Lunch: No Lunch - Dinner: meat + vegetables >> late dinner during the weekend as her son is cooking and brings her food - Snacks: 1-2  -+ CKD (FSGS-sees nephrology), last BUN/creatinine:  Lab Results  Component Value Date   BUN 30 (H) 01/06/2023   CREATININE 1.56 (H) 01/06/2023   Lab Results  Component Value Date   GFRAA 24 (L) 06/10/2019   GFRAA 27 (L) 04/28/2019   GFRAA 43 (L) 07/27/2018   GFRAA >60 06/26/2018   GFRAA 38 (L) 06/02/2018   GFRAA 82 06/28/2016   GFRAA >90 06/25/2014   GFRAA >90 06/24/2014   GFRAA >90 10/23/2013  12/21/2018: GFR 28.9   She has MAU: Lab Results  Component Value Date  MICRALBCREAT 6.3 02/11/2022   MICRALBCREAT 43.8 (H) 11/04/2019   MICRALBCREAT 77.6 (H) 05/06/2019   MICRALBCREAT 422.7 (H) 04/02/2018   MICRALBCREAT 225.8 (H) 11/15/2016   MICRALBCREAT 48.8 (H) 06/28/2016   MICRALBCREAT 9.9 02/21/2014   MICRALBCREAT 8.8 11/16/2013  On lisinopril 40.  -+ CKD; last set of lipids: Lab Results  Component Value Date   CHOL 166 01/06/2023   HDL 55.70 01/06/2023   LDLCALC 89 01/06/2023   TRIG 104.0 01/06/2023   CHOLHDL 3 01/06/2023  Prev. on Simvastatin >> off in 06/2016 b/c fatigue >> pravastatin 80 mg daily in 12/2016 >> Crestor 40 >> Lipitor 80. Zetia was added.  - no numbness and tingling in her feet.  + Onychomycosis.  Latest foot exam: 01/06/2023.  She sees podiatry.  - Last eye exam in 09/2019: No DR reportedly; Had OS cataract sx.   H/o  hyperthyroidism - previously treated with medication, more than 20 years ago.  No other history available.  Pt denies: - feeling nodules in neck - hoarseness - dysphagia - choking  Her TFTs were normal: Lab Results  Component Value Date   TSH 0.58 10/28/2022   TSH 0.72 07/11/2021   TSH 0.37 11/02/2020   TSH 1.12 05/12/2019   TSH 0.635 06/26/2018   TSH 1.11 04/02/2018   TSH 1.13 07/03/2017   TSH 1.12 06/28/2016   TSH 1.29 06/23/2015   TSH 1.44 10/18/2014   FREET4 1.04 07/11/2021   FREET4 0.85 05/12/2019   FREET4 0.93 07/03/2017   FREET4 0.96 06/28/2016   FREET4 0.91 08/15/2014   FREET4 1.00 02/17/2014   FREET4 0.97 12/20/2013   T3FREE 2.6 07/11/2021   T3FREE 2.7 05/12/2019   T3FREE 3.2 07/03/2017   T3FREE 3.1 06/28/2016   T3FREE 3.2 08/15/2014   T3FREE 2.6 02/17/2014   T3FREE 2.9 12/20/2013   She had a CVA on 10/06/2020 >> had  PT. She also has a history of gout. She has chronic myeloproliferative ds.  She is on hydroxyurea. 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  ROS: + see HPI  I reviewed pt's medications, allergies, PMH, social hx, family hx, and changes were documented in the history of present illness. Otherwise, unchanged from my initial visit note.  Past Medical History:  Diagnosis Date   CVA (cerebral vascular accident) (HCC) 10/2020   Diabetes (HCC)    GERD (gastroesophageal reflux disease)    Hyperlipidemia    Hypertension    Hyperthyroidism    Pneumonia    Polycythemia    Shingles    Past Surgical History:  Procedure Laterality Date   CATARACT EXTRACTION Left 2020   COLONOSCOPY     DENTAL SURGERY     IR ANGIO INTRA EXTRACRAN SEL COM CAROTID INNOMINATE BILAT MOD SED  10/09/2020   IR ANGIO VERTEBRAL SEL VERTEBRAL BILAT MOD SED  10/09/2020   Social History   Socioeconomic History   Marital status: Married    Spouse name: Not on file   Number of children: Not on  file   Years of education: Not on file   Highest education level: Not on file  Occupational History   Occupation: ABB--    Comment: retired  Tobacco Use   Smoking status: Never   Smokeless tobacco: Never  Vaping Use   Vaping Use: Never used  Substance and Sexual Activity   Alcohol use: Yes    Alcohol/week: 0.0 standard drinks of alcohol  Comment: occasional wine cooler   Drug use: No   Sexual activity: Not Currently    Partners: Male  Other Topics Concern   Not on file  Social History Narrative   Exercise-- yard work,  Walking in [employer]   03/15/21 lives with husband   Social Determinants of Health   Financial Resource Strain: Low Risk  (04/10/2021)   Overall Financial Resource Strain (CARDIA)    Difficulty of Paying Living Expenses: Not hard at all  Food Insecurity: No Food Insecurity (04/10/2021)   Hunger Vital Sign    Worried About Running Out of Food in the Last Year: Never true    Ran Out of Food in the Last Year: Never true  Transportation Needs: No Transportation Needs (04/10/2021)   PRAPARE - Administrator, Civil Service (Medical): No    Lack of Transportation (Non-Medical): No  Physical Activity: Insufficiently Active (04/10/2021)   Exercise Vital Sign    Days of Exercise per Week: 3 days    Minutes of Exercise per Session: 30 min  Stress: No Stress Concern Present (04/10/2021)   [employer] of Occupational Health - Occupational Stress Questionnaire    Feeling of Stress : Not at all  Social Connections: Moderately Isolated (04/10/2021)   Social Connection and Isolation Panel [NHANES]    Frequency of Communication with Friends and Family: More than three times a week    Frequency of Social Gatherings with Friends and Family: More than three times a week    Attends Religious Services: Never    Database administrator or Organizations: No    Attends Banker Meetings: Never    Marital Status: Married  Catering manager Violence: Not At Risk  (04/10/2021)   Humiliation, Afraid, Rape, and Kick questionnaire    Fear of Current or Ex-Partner: No    Emotionally Abused: No    Physically Abused: No    Sexually Abused: No   Current Outpatient Medications on File Prior to Visit  Medication Sig Dispense Refill   amLODipine (NORVASC) 10 MG tablet Take 1 tablet by mouth once daily 90 tablet 0   atorvastatin (LIPITOR) 80 MG tablet Take 1 tablet (80 mg total) by mouth daily. 90 tablet 1   Blood Glucose Monitoring Suppl (ONETOUCH VERIO) w/Device KIT Use as instructed to check blood sugar TWICE DAILY 1 kit 0   clopidogrel (PLAVIX) 75 MG tablet TAKE 1 TABLET BY MOUTH ONCE DAILY . APPOINTMENT REQUIRED FOR FUTURE REFILLS 15 tablet 0   ezetimibe (ZETIA) 10 MG tablet Take 1 tablet by mouth once daily 90 tablet 0   glipiZIDE (GLUCOTROL XL) 5 MG 24 hr tablet Take 2 tablets (10 mg total) by mouth daily. 180 tablet 3   glucose blood (ONETOUCH VERIO) test strip USE 1 STRIP TO CHECK GLUCOSE TWICE DAILY 200 each 0   hydroxyurea (HYDREA) 500 MG capsule TAKE 1 CAPSULE BY MOUTH ONCE DAILY WITH A MEAL. TAKE THIS MEDICINE MONDAY- THURSDAY 90 capsule 0   Lancets (ONETOUCH DELICA PLUS LANCET33G) MISC USE 1  TO CHECK GLUCOSE TWICE DAILY 200 each 3   lisinopril (ZESTRIL) 40 MG tablet Take 1 tablet by mouth once daily 90 tablet 0   metoprolol succinate (TOPROL-XL) 25 MG 24 hr tablet Take 1 tablet (25 mg total) by mouth daily. Take with or immediately following a meal 90 tablet 1   pantoprazole (PROTONIX) 40 MG tablet Take 1 tablet by mouth once daily 90 tablet 1   polyethylene glycol (MIRALAX) 17 g  packet Take 17 g by mouth daily as needed. 14 each 0   No current facility-administered medications on file prior to visit.   Allergies  Allergen Reactions   Penicillins Nausea And Vomiting    Has patient had a PCN reaction causing immediate rash, facial/tongue/throat swelling, SOB or lightheadedness with hypotension: no Has patient had a PCN reaction causing severe  rash involving mucus membranes or skin necrosis: no Has patient had a PCN reaction that required hospitalization: no Has patient had a PCN reaction occurring within the last 10 years: no If all of the above answers are "NO", then may proceed with Cephalosporin use.    Family History  Problem Relation Age of Onset   Diabetes Mother        and on mother's side of family   Colon cancer Mother    Stroke Mother    Diabetes Father        and on father's side of family   Diabetes Sister    Diabetes Sister    Diabetes Brother    Heart disease Neg Hx    Rectal cancer Neg Hx    Stomach cancer Neg Hx    PE: BP 130/80   Pulse 72   Ht 5\' 4"  (1.626 m)   Wt 123 lb (55.8 kg)   SpO2 99%   BMI 21.11 kg/m   Wt Readings from Last 3 Encounters:  02/27/23 123 lb (55.8 kg)  01/06/23 121 lb 9.6 oz (55.2 kg)  12/20/22 122 lb 4.8 oz (55.5 kg)   Constitutional:  Normal weight, in NAD Eyes: PERRLA, EOMI, no exophthalmos ENT: no thyromegaly, no cervical lymphadenopathy Cardiovascular: RRR, No MRG Respiratory: CTA B Musculoskeletal: no deformities Skin:  no rashes Neurological: no tremor with outstretched hands  ASSESSMENT: 1. DM2, non-insulin-dependent, uncontrolled, with complications - CVA 10/2020 - MAU  2. H/o Hyperthyroidism - 20 years ago, resolved since - was on meds, cannot remember name  3. HL  PLAN:  1. Patient with history of uncontrolled type 2 diabetes, on sulfonylurea only.  We had to stop metformin ER due to declining kidney function in the setting of FSGS.  She sees nephrology.  She had a CVA in 10/2020 but afterwards she started to improve her diet and sugars improved.  HbA1c decreased to 6.9%.  Afterwards, she started to drink sweet tea and regular sodas and HbA1c started to increase.  Sugars at home, however, appear to be well-controlled, discrepant with the HbA1c and fructosamine levels obtained in the clinic.  At last visit, HbA1c was 8.1%, improved, but still above  target, however, an HbA1c calculated from fructosamine was very high, at 10.4%.  I advised her to try to stop juice or any other sweet drinks but we did not change the regimen otherwise since fingersticks showed blood sugars at goal. -At today's visit, she tells me that she was not able to change her glucometer's visit.  Sugars at home are still almost all at goal.  She is fasting today and we checked her CBG in the office.  This is 220.  Therefore, at today's visit, especially in the setting of a higher HbA1c, of 9%, I suggested insulin.  We showed her how to do the injections.  I advised her to keep it on the nightstand.  Will start at a low dose and increase the dose slowly as needed.  Will continue glipizide for now. - I suggested to:  Patient Instructions  Please continue: - Glipizide XL 10 mg before breakfast  NO JUICE, SWEET TEA AND REGULAR SODAS!  Please add: -  Lantus 8 units at bedtime. If needed, increase the dose by 2 units every 2 days until sugars in am are <140.  Try to get the ReliOn meter from [place].  Schedule a new eye exam.  Please return in 3-4 months with your sugar log.   - we checked her HbA1c: 9% (higher) - advised to check sugars at different times of the day - 1x a day, rotating check times - Strongly advised her to schedule annual eye exam>> she is not UTD - return to clinic in 3-4 months  2. H/o hyperthyroidism -No thyrotoxic signs or symptoms -TSH remains normal: Lab Results  Component Value Date   TSH 0.58 10/28/2022   3. HL -Reviewed latest lipid panel from last month: LDL above our target of less than 55 due to cardiovascular disease, rest of the fractions at goal: Lab Results  Component Value Date   CHOL 166 01/06/2023   HDL 55.70 01/06/2023   LDLCALC 89 01/06/2023   TRIG 104.0 01/06/2023   CHOLHDL 3 01/06/2023  -On Lipitor 80 mg daily and Zetia 10 g daily without side effects  Carlus Pavlov, MD PhD [facility]

## 2023-02-27 NOTE — Addendum Note (Signed)
Addended by: Pollie Meyer on: 02/27/2023 04:05 PM   Modules accepted: Orders

## 2023-03-02 ENCOUNTER — Other Ambulatory Visit: Payer: Self-pay | Admitting: Family Medicine

## 2023-03-15 ENCOUNTER — Other Ambulatory Visit: Payer: Self-pay | Admitting: Family Medicine

## 2023-03-21 ENCOUNTER — Telehealth: Payer: Self-pay | Admitting: *Deleted

## 2023-03-21 ENCOUNTER — Inpatient Hospital Stay: Payer: Medicare HMO | Attending: Nurse Practitioner

## 2023-03-21 DIAGNOSIS — D471 Chronic myeloproliferative disease: Secondary | ICD-10-CM

## 2023-03-21 DIAGNOSIS — D45 Polycythemia vera: Secondary | ICD-10-CM | POA: Diagnosis not present

## 2023-03-21 LAB — CMP (CANCER CENTER ONLY)
ALT: 25 U/L (ref 0–44)
AST: 19 U/L (ref 15–41)
Albumin: 4 g/dL (ref 3.5–5.0)
Alkaline Phosphatase: 52 U/L (ref 38–126)
Anion gap: 7 (ref 5–15)
BUN: 30 mg/dL — ABNORMAL HIGH (ref 8–23)
CO2: 25 mmol/L (ref 22–32)
Calcium: 9.2 mg/dL (ref 8.9–10.3)
Chloride: 109 mmol/L (ref 98–111)
Creatinine: 1.93 mg/dL — ABNORMAL HIGH (ref 0.44–1.00)
GFR, Estimated: 26 mL/min — ABNORMAL LOW (ref >60)
Glucose, Bld: 228 mg/dL — ABNORMAL HIGH (ref 70–99)
Potassium: 4.4 mmol/L (ref 3.5–5.1)
Sodium: 141 mmol/L (ref 135–145)
Total Bilirubin: 0.9 mg/dL (ref 0.3–1.2)
Total Protein: 6.5 g/dL (ref 6.5–8.1)

## 2023-03-21 LAB — CBC WITH DIFFERENTIAL (CANCER CENTER ONLY)
Abs Immature Granulocytes: 0.02 10*3/uL (ref 0.00–0.07)
Basophils Absolute: 0 10*3/uL (ref 0.0–0.1)
Basophils Relative: 1 %
Eosinophils Absolute: 0.1 10*3/uL (ref 0.0–0.5)
Eosinophils Relative: 2 %
HCT: 38.7 % (ref 36.0–46.0)
Hemoglobin: 12.6 g/dL (ref 12.0–15.0)
Immature Granulocytes: 0 %
Lymphocytes Relative: 24 %
Lymphs Abs: 1.3 10*3/uL (ref 0.7–4.0)
MCH: 33.2 pg (ref 26.0–34.0)
MCHC: 32.6 g/dL (ref 30.0–36.0)
MCV: 102.1 fL — ABNORMAL HIGH (ref 80.0–100.0)
Monocytes Absolute: 0.5 10*3/uL (ref 0.1–1.0)
Monocytes Relative: 9 %
Neutro Abs: 3.6 10*3/uL (ref 1.7–7.7)
Neutrophils Relative %: 64 %
Platelet Count: 528 10*3/uL — ABNORMAL HIGH (ref 150–400)
RBC: 3.79 MIL/uL — ABNORMAL LOW (ref 3.87–5.11)
RDW: 15.4 % (ref 11.5–15.5)
WBC Count: 5.7 10*3/uL (ref 4.0–10.5)
nRBC: 0 % (ref 0.0–0.2)

## 2023-03-21 NOTE — Telephone Encounter (Signed)
-----   Message from Thornton Papas sent at 03/21/2023 11:37 AM EDT ----- Please call patient, continue hydroxyurea same dose, f/u as scheduled

## 2023-03-21 NOTE — Telephone Encounter (Signed)
Notified of stable results and continue current Hydrea of 500 mg daily M-Th. F/U as scheduled.

## 2023-04-01 ENCOUNTER — Ambulatory Visit (INDEPENDENT_AMBULATORY_CARE_PROVIDER_SITE_OTHER): Payer: Medicare HMO | Admitting: Podiatry

## 2023-04-01 DIAGNOSIS — B351 Tinea unguium: Secondary | ICD-10-CM | POA: Diagnosis not present

## 2023-04-01 DIAGNOSIS — M79674 Pain in right toe(s): Secondary | ICD-10-CM

## 2023-04-01 DIAGNOSIS — M79675 Pain in left toe(s): Secondary | ICD-10-CM | POA: Diagnosis not present

## 2023-04-01 NOTE — Progress Notes (Signed)
    Subjective:  Patient ID: Brandi Mccormick, female    DOB: 03-24-1941,  MRN: [id number]  Patient notes nails are thick, discolored, elongated and painful in shoegear when trying to ambulate.    PCP is Zola Button, Blair R, DO.  Last seen 01/06/23.  Allergies  Allergen Reactions   Penicillins Nausea And Vomiting    Has patient had a PCN reaction causing immediate rash, facial/tongue/throat swelling, SOB or lightheadedness with hypotension: no Has patient had a PCN reaction causing severe rash involving mucus membranes or skin necrosis: no Has patient had a PCN reaction that required hospitalization: no Has patient had a PCN reaction occurring within the last 10 years: no If all of the above answers are "NO", then may proceed with Cephalosporin use.    Review of Systems: Negative except as noted in the HPI.  Objective:  There were no vitals filed for this visit.  Brandi Mccormick is a pleasant 82 y.o. female in NAD. AAO x 3.  Vascular Examination: Capillary refill time is 3-5 seconds to toes bilateral. Palpable pedal pulses b/l LE. Digital hair present b/l. No pedal edema b/l. Skin temperature gradient WNL b/l. No varicosities b/l. No cyanosis or clubbing noted b/l.   Dermatological Examination: Pedal skin with normal turgor, texture and tone b/l. No open wounds. No interdigital macerations b/l. Toenails x10 are 3mm thick, discolored, dystrophic with subungual debris. There is pain with compression of the nail plates.  They are elongated x10     Latest Ref Rng & Units 02/27/2023    4:04 PM 10/28/2022   10:34 AM 06/27/2022   10:16 AM  Hemoglobin A1C  Hemoglobin-A1c 4.0 - 5.6 % 9.0  8.1  8.4    Assessment/Plan: 1. Pain due to onychomycosis of toenails of both feet     The mycotic toenails were sharply debrided x10 with sterile nail nippers and a power debriding burr to decrease bulk/thickness and length.    Return in about 3 months (around 07/02/2023) for [facility].   Clerance Lav, DPM, FACFAS [facility]     [address] [place], [place] [address]                Office [phone]  Fax [phone]

## 2023-04-21 ENCOUNTER — Ambulatory Visit (INDEPENDENT_AMBULATORY_CARE_PROVIDER_SITE_OTHER): Payer: Medicare HMO | Admitting: *Deleted

## 2023-04-21 VITALS — BP 132/66 | HR 80 | Ht 64.0 in | Wt 124.0 lb

## 2023-04-21 DIAGNOSIS — Z0000 Encounter for general adult medical examination without abnormal findings: Secondary | ICD-10-CM

## 2023-04-21 NOTE — Patient Instructions (Signed)
Brandi Mccormick , Thank you for taking time to come for your Medicare Wellness Visit. I appreciate your ongoing commitment to your health goals. Please review the following plan we discussed and let me know if I can assist you in the future.     This is a list of the screening recommended for you and due dates:  Health Maintenance  Topic Date Due   DTaP/Tdap/Td vaccine (1 - Tdap) Never done   Zoster (Shingles) Vaccine (1 of 2) Never done   Eye exam for diabetics  09/14/2020   COVID-19 Vaccine (4 - 2023-24 season) 05/03/2022   Yearly kidney health urinalysis for diabetes  02/12/2023   Flu Shot  04/03/2023   Pneumonia Vaccine (2 of 2 - PCV) 01/06/2024*   Hemoglobin A1C  08/29/2023   Complete foot exam   01/06/2024   Yearly kidney function blood test for diabetes  03/20/2024   Medicare Annual Wellness Visit  04/20/2024   DEXA scan (bone density measurement)  Addressed   HPV Vaccine  Aged Out   Colon Cancer Screening  Discontinued   Hepatitis C Screening  Discontinued  *Topic was postponed. The date shown is not the original due date.    Next appointment: Follow up in one year for your annual wellness visit.   Preventive Care 45 Years and Older, Female Preventive care refers to lifestyle choices and visits with your health care provider that can promote health and wellness. What does preventive care include? A yearly physical exam. This is also called an annual well check. Dental exams once or twice a year. Routine eye exams. Ask your health care provider how often you should have your eyes checked. Personal lifestyle choices, including: Daily care of your teeth and gums. Regular physical activity. Eating a healthy diet. Avoiding tobacco and drug use. Limiting alcohol use. Practicing safe sex. Taking low-dose aspirin every day. Taking vitamin and mineral supplements as recommended by your health care provider. What happens during an annual well check? The services and  screenings done by your health care provider during your annual well check will depend on your age, overall health, lifestyle risk factors, and family history of disease. Counseling  Your health care provider may ask you questions about your: Alcohol use. Tobacco use. Drug use. Emotional well-being. Home and relationship well-being. Sexual activity. Eating habits. History of falls. Memory and ability to understand (cognition). Work and work Astronomer. Reproductive health. Screening  You may have the following tests or measurements: Height, weight, and BMI. Blood pressure. Lipid and cholesterol levels. These may be checked every 5 years, or more frequently if you are over 27 years old. Skin check. Lung cancer screening. You may have this screening every year starting at age 29 if you have a 30-pack-year history of smoking and currently smoke or have quit within the past 15 years. Fecal occult blood test (FOBT) of the stool. You may have this test every year starting at age 46. Flexible sigmoidoscopy or colonoscopy. You may have a sigmoidoscopy every 5 years or a colonoscopy every 10 years starting at age 71. Hepatitis C blood test. Hepatitis B blood test. Sexually transmitted disease (STD) testing. Diabetes screening. This is done by checking your blood sugar (glucose) after you have not eaten for a while (fasting). You may have this done every 1-3 years. Bone density scan. This is done to screen for osteoporosis. You may have this done starting at age 1. Mammogram. This may be done every 1-2 years. Talk to your health  care provider about how often you should have regular mammograms. Talk with your health care provider about your test results, treatment options, and if necessary, the need for more tests. Vaccines  Your health care provider may recommend certain vaccines, such as: Influenza vaccine. This is recommended every year. Tetanus, diphtheria, and acellular pertussis (Tdap,  Td) vaccine. You may need a Td booster every 10 years. Zoster vaccine. You may need this after age 34. Pneumococcal 13-valent conjugate (PCV13) vaccine. One dose is recommended after age 39. Pneumococcal polysaccharide (PPSV23) vaccine. One dose is recommended after age 14. Talk to your health care provider about which screenings and vaccines you need and how often you need them. This information is not intended to replace advice given to you by your health care provider. Make sure you discuss any questions you have with your health care provider. Document Released: 09/15/2015 Document Revised: 05/08/2016 Document Reviewed: 06/20/2015 Elsevier Interactive Patient Education  2017 [employer].  Fall Prevention in the Home Falls can cause injuries. They can happen to people of all ages. There are many things you can do to make your home safe and to help prevent falls. What can I do on the outside of my home? Regularly fix the edges of walkways and driveways and fix any cracks. Remove anything that might make you trip as you walk through a door, such as a raised step or threshold. Trim any bushes or trees on the path to your home. Use bright outdoor lighting. Clear any walking paths of anything that might make someone trip, such as rocks or tools. Regularly check to see if handrails are loose or broken. Make sure that both sides of any steps have handrails. Any raised decks and porches should have guardrails on the edges. Have any leaves, snow, or ice cleared regularly. Use sand or salt on walking paths during winter. Clean up any spills in your garage right away. This includes oil or grease spills. What can I do in the bathroom? Use night lights. Install grab bars by the toilet and in the tub and shower. Do not use towel bars as grab bars. Use non-skid mats or decals in the tub or shower. If you need to sit down in the shower, use a plastic, non-slip stool. Keep the floor dry. Clean up any  water that spills on the floor as soon as it happens. Remove soap buildup in the tub or shower regularly. Attach bath mats securely with double-sided non-slip rug tape. Do not have throw rugs and other things on the floor that can make you trip. What can I do in the bedroom? Use night lights. Make sure that you have a light by your bed that is easy to reach. Do not use any sheets or blankets that are too big for your bed. They should not hang down onto the floor. Have a firm chair that has side arms. You can use this for support while you get dressed. Do not have throw rugs and other things on the floor that can make you trip. What can I do in the kitchen? Clean up any spills right away. Avoid walking on wet floors. Keep items that you use a lot in easy-to-reach places. If you need to reach something above you, use a strong step stool that has a grab bar. Keep electrical cords out of the way. Do not use floor polish or wax that makes floors slippery. If you must use wax, use non-skid floor wax. Do not have throw  rugs and other things on the floor that can make you trip. What can I do with my stairs? Do not leave any items on the stairs. Make sure that there are handrails on both sides of the stairs and use them. Fix handrails that are broken or loose. Make sure that handrails are as long as the stairways. Check any carpeting to make sure that it is firmly attached to the stairs. Fix any carpet that is loose or worn. Avoid having throw rugs at the top or bottom of the stairs. If you do have throw rugs, attach them to the floor with carpet tape. Make sure that you have a light switch at the top of the stairs and the bottom of the stairs. If you do not have them, ask someone to add them for you. What else can I do to help prevent falls? Wear shoes that: Do not have high heels. Have rubber bottoms. Are comfortable and fit you well. Are closed at the toe. Do not wear sandals. If you use a  stepladder: Make sure that it is fully opened. Do not climb a closed stepladder. Make sure that both sides of the stepladder are locked into place. Ask someone to hold it for you, if possible. Clearly mark and make sure that you can see: Any grab bars or handrails. First and last steps. Where the edge of each step is. Use tools that help you move around (mobility aids) if they are needed. These include: Canes. Walkers. Scooters. Crutches. Turn on the lights when you go into a dark area. Replace any light bulbs as soon as they burn out. Set up your furniture so you have a clear path. Avoid moving your furniture around. If any of your floors are uneven, fix them. If there are any pets around you, be aware of where they are. Review your medicines with your doctor. Some medicines can make you feel dizzy. This can increase your chance of falling. Ask your doctor what other things that you can do to help prevent falls. This information is not intended to replace advice given to you by your health care provider. Make sure you discuss any questions you have with your health care provider. Document Released: 06/15/2009 Document Revised: 01/25/2016 Document Reviewed: 09/23/2014 Elsevier Interactive Patient Education  2017 [employer].

## 2023-04-21 NOTE — Progress Notes (Signed)
Subjective:   Brandi Mccormick is a 82 y.o. female who presents for Medicare Annual (Subsequent) preventive examination.  Visit Complete: In person   Review of Systems    Cardiac Risk Factors include: advanced age (>19men, >46 women);dyslipidemia;diabetes mellitus;hypertension     Objective:    Today's Vitals   04/21/23 1008  BP: 132/66  Pulse: 80  Weight: 124 lb (56.2 kg)  Height: 5\' 4"  (1.626 m)   Body mass index is 21.28 kg/m.     04/21/2023   10:17 AM 12/20/2022   10:20 AM 08/22/2022   10:52 AM 05/30/2022   10:42 AM 04/16/2022   10:04 AM 03/07/2022   10:39 AM 01/24/2022   10:22 AM  Advanced Directives  Does Patient Have a Medical Advance Directive? No No No No No No No  Would patient like information on creating a medical advance directive? No - Patient declined No - Patient declined No - Patient declined No - Patient declined No - Patient declined No - Patient declined No - Patient declined    Current Medications (verified) Outpatient Encounter Medications as of 04/21/2023  Medication Sig   amLODipine (NORVASC) 10 MG tablet Take 1 tablet by mouth once daily   atorvastatin (LIPITOR) 80 MG tablet Take 1 tablet (80 mg total) by mouth daily.   Blood Glucose Monitoring Suppl (ONETOUCH VERIO) w/Device KIT Use as instructed to check blood sugar TWICE DAILY   clopidogrel (PLAVIX) 75 MG tablet TAKE 1 TABLET BY MOUTH ONCE DAILY . APPOINTMENT REQUIRED FOR FUTURE REFILLS   ezetimibe (ZETIA) 10 MG tablet Take 1 tablet (10 mg total) by mouth daily.   glipiZIDE (GLUCOTROL XL) 5 MG 24 hr tablet Take 2 tablets (10 mg total) by mouth daily.   glucose blood (ONETOUCH VERIO) test strip USE 1 STRIP TO CHECK GLUCOSE TWICE DAILY   hydroxyurea (HYDREA) 500 MG capsule TAKE 1 CAPSULE BY MOUTH ONCE DAILY WITH A MEAL. TAKE THIS MEDICINE MONDAY- THURSDAY   Lancets (ONETOUCH DELICA PLUS LANCET33G) MISC USE 1  TO CHECK GLUCOSE TWICE DAILY   lisinopril (ZESTRIL) 40 MG tablet Take 1 tablet by mouth  once daily   metoprolol succinate (TOPROL-XL) 25 MG 24 hr tablet Take 1 tablet (25 mg total) by mouth daily. Take with or immediately following a meal   pantoprazole (PROTONIX) 40 MG tablet Take 1 tablet by mouth once daily   polyethylene glycol (MIRALAX) 17 g packet Take 17 g by mouth daily as needed.   insulin glargine (LANTUS SOLOSTAR) 100 UNIT/ML Solostar Pen Inject 8-10 Units into the skin at bedtime. (Patient not taking: Reported on 04/21/2023)   Insulin Pen Needle 32G X 4 MM MISC Use 1x a day (Patient not taking: Reported on 04/21/2023)   No facility-administered encounter medications on file as of 04/21/2023.    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   Diabetes (HCC)    GERD (gastroesophageal reflux disease)    Hyperlipidemia    Hypertension    Hyperthyroidism    Pneumonia    Polycythemia    Shingles    Past Surgical History:  Procedure Laterality Date   CATARACT EXTRACTION Left 2020   COLONOSCOPY     DENTAL SURGERY     IR ANGIO INTRA EXTRACRAN SEL COM CAROTID INNOMINATE BILAT MOD SED  10/09/2020   IR ANGIO VERTEBRAL SEL VERTEBRAL BILAT MOD SED  10/09/2020   Family History  Problem Relation Age of Onset  Diabetes Mother        and on mother's side of family   Colon cancer Mother    Stroke Mother    Diabetes Father        and on father's side of family   Diabetes Sister    Diabetes Sister    Diabetes Brother    Heart disease Neg Hx    Rectal cancer Neg Hx    Stomach cancer Neg Hx    Social History   Socioeconomic History   Marital status: Married    Spouse name: Not on file   Number of children: Not on file   Years of education: Not on file   Highest education level: Not on file  Occupational History   Occupation: [employer]--    Comment: retired  Tobacco Use   Smoking status: Never   Smokeless tobacco: Never  Vaping Use   Vaping status: Never Used  Substance and  Sexual Activity   Alcohol use: Yes    Alcohol/week: 0.0 standard drinks of alcohol    Comment: occasional wine cooler   Drug use: No   Sexual activity: Not Currently    Partners: Male  Other Topics Concern   Not on file  Social History Narrative   Exercise-- yard work,  Walking in [employer]   03/15/21 lives with husband   Social Determinants of Health   Financial Resource Strain: Low Risk  (04/21/2023)   Overall Financial Resource Strain (CARDIA)    Difficulty of Paying Living Expenses: Not hard at all  Food Insecurity: No Food Insecurity (04/21/2023)   Hunger Vital Sign    Worried About Running Out of Food in the Last Year: Never true    Ran Out of Food in the Last Year: Never true  Transportation Needs: No Transportation Needs (04/21/2023)   PRAPARE - Administrator, Civil Service (Medical): No    Lack of Transportation (Non-Medical): No  Physical Activity: Inactive (04/21/2023)   Exercise Vital Sign    Days of Exercise per Week: 0 days    Minutes of Exercise per Session: 0 min  Stress: No Stress Concern Present (04/21/2023)   [employer] of Occupational Health - Occupational Stress Questionnaire    Feeling of Stress : Not at all  Social Connections: Moderately Isolated (04/21/2023)   Social Connection and Isolation Panel [NHANES]    Frequency of Communication with Friends and Family: More than three times a week    Frequency of Social Gatherings with Friends and Family: Three times a week    Attends Religious Services: Never    Active Member of Clubs or Organizations: No    Attends Banker Meetings: Never    Marital Status: Married    Tobacco Counseling Counseling given: Not Answered   Clinical Intake:  Pre-visit preparation completed: Yes  Pain : No/denies pain   BMI - recorded: 21.28 Nutritional Risks: None Diabetes: Yes CBG done?: No Did pt. bring in CBG monitor from home?: No  How often do you need to have someone help you when  you read instructions, pamphlets, or other written materials from your doctor or pharmacy?: 1 - Never  Interpreter Needed?: No  Information entered by :: Donne Anon, CMA   Activities of Daily Living    04/21/2023   10:14 AM  In your present state of health, do you have any difficulty performing the following activities:  Hearing? 0  Vision? 0  Difficulty concentrating or making decisions? 0  Walking or climbing  stairs? 0  Dressing or bathing? 0  Doing errands, shopping? 0  Preparing Food and eating ? N  Using the Toilet? N  In the past six months, have you accidently leaked urine? N  Do you have problems with loss of bowel control? N  Managing your Medications? N  Managing your Finances? N  Housekeeping or managing your Housekeeping? N    Patient Care Team: Zola Button, Grayling Congress, DO as PCP - General (Family Medicine) Carlus Pavlov, MD as Consulting Physician (Internal Medicine) [facility] as Consulting Physician (Podiatry) Tyler Pita, MD as Attending Physician (Nephrology) Asencion Islam, DPM as Consulting Physician (Podiatry) Maris Berger, MD as Consulting Physician (Ophthalmology) Freddie Breech, DPM as Consulting Physician (Podiatry)  Indicate any recent Medical Services you may have received from other than [facility] providers in the past year (date may be approximate).     Assessment:   This is a routine wellness examination for Chela.  Hearing/Vision screen No results found.  Dietary issues and exercise activities discussed:     Goals Addressed   None    Depression Screen    04/21/2023   10:20 AM 08/19/2022   10:05 AM 04/16/2022   10:05 AM 04/10/2021   10:30 AM 04/06/2020    9:30 AM 04/05/2019    9:08 AM 04/02/2018    8:04 AM  PHQ 2/9 Scores  PHQ - 2 Score 0 0 0 0 0 0 0    Fall Risk    04/21/2023   10:16 AM 08/19/2022   10:05 AM 04/16/2022   10:04 AM 04/10/2021   10:29 AM 03/15/2021    3:20 PM  Fall  Risk   Falls in the past year? 0 0 0 0 0  Number falls in past yr: 0 0 0 0   Injury with Fall? 0 0 0 0   Risk for fall due to : No Fall Risks  History of fall(s)    Follow up Falls evaluation completed Falls evaluation completed Falls evaluation completed Falls prevention discussed     MEDICARE RISK AT HOME: Medicare Risk at Home Any stairs in or around the home?: Yes (2 steps from the carport to the door) If so, are there any without handrails?: Yes Home free of loose throw rugs in walkways, pet beds, electrical cords, etc?: Yes Adequate lighting in your home to reduce risk of falls?: Yes Life alert?: No Use of a cane, walker or w/c?: No Grab bars in the bathroom?: No Shower chair or bench in shower?: No Elevated toilet seat or a handicapped toilet?: No  TIMED UP AND GO:  Was the test performed?  Yes  Length of time to ambulate 10 feet: 6 sec Gait steady and fast without use of assistive device    Cognitive Function:    04/06/2020    9:32 AM 04/02/2018    8:05 AM 03/21/2017    8:11 AM 04/24/2015   11:09 AM  MMSE - Mini Mental State Exam  Not completed: Refused     Orientation to time  5 5 5   Orientation to Place  5 5 5   Registration  3 3 3   Attention/ Calculation  4 3 4   Recall  2 2 2   Language- name 2 objects  2 2 2   Language- repeat  1 1 1   Language- follow 3 step command  3 3 3   Language- read & follow direction  1 1 1   Write a sentence  1 1  1  Copy design  1 1 1   Total score  28 27 28         04/21/2023   10:21 AM 04/16/2022   10:12 AM 04/10/2021   10:35 AM  6CIT Screen  What Year? 0 points 0 points 0 points  What month? 0 points 0 points 0 points  What time? 0 points 0 points 0 points  Count back from 20 2 points 2 points 0 points  Months in reverse 0 points 4 points 4 points  Repeat phrase 4 points 2 points 0 points  Total Score 6 points 8 points 4 points    Immunizations Immunization History  Administered Date(s) Administered   PFIZER(Purple  Top)SARS-COV-2 Vaccination 10/31/2019, 11/24/2019, 08/01/2020   Pneumococcal Polysaccharide-23 11/16/2013    TDAP status: Due, Education has been provided regarding the importance of this vaccine. Advised may receive this vaccine at local pharmacy or Health Dept. Aware to provide a copy of the vaccination record if obtained from local pharmacy or Health Dept. Verbalized acceptance and understanding.  Flu Vaccine status: Due, Education has been provided regarding the importance of this vaccine. Advised may receive this vaccine at local pharmacy or Health Dept. Aware to provide a copy of the vaccination record if obtained from local pharmacy or Health Dept. Verbalized acceptance and understanding.  Pneumococcal vaccine status: Due, Education has been provided regarding the importance of this vaccine. Advised may receive this vaccine at local pharmacy or Health Dept. Aware to provide a copy of the vaccination record if obtained from local pharmacy or Health Dept. Verbalized acceptance and understanding.  Covid-19 vaccine status: Information provided on how to obtain vaccines.   Qualifies for Shingles Vaccine? Yes   Zostavax completed No   Shingrix Completed?: No.    Education has been provided regarding the importance of this vaccine. Patient has been advised to call insurance company to determine out of pocket expense if they have not yet received this vaccine. Advised may also receive vaccine at local pharmacy or Health Dept. Verbalized acceptance and understanding.  Screening Tests Health Maintenance  Topic Date Due   DTaP/Tdap/Td (1 - Tdap) Never done   Zoster Vaccines- Shingrix (1 of 2) Never done   OPHTHALMOLOGY EXAM  09/14/2020   COVID-19 Vaccine (4 - 2023-24 season) 05/03/2022   Diabetic kidney evaluation - Urine ACR  02/12/2023   Medicare Annual Wellness (AWV)  04/17/2023   INFLUENZA VACCINE  04/03/2023   Pneumonia Vaccine 37+ Years old (2 of 2 - PCV) 01/06/2024 (Originally 11/17/2014)    HEMOGLOBIN A1C  08/29/2023   FOOT EXAM  01/06/2024   Diabetic kidney evaluation - eGFR measurement  03/20/2024   DEXA SCAN  Addressed   HPV VACCINES  Aged Out   Colonoscopy  Discontinued   Hepatitis C Screening  Discontinued    Health Maintenance  Health Maintenance Due  Topic Date Due   DTaP/Tdap/Td (1 - Tdap) Never done   Zoster Vaccines- Shingrix (1 of 2) Never done   OPHTHALMOLOGY EXAM  09/14/2020   COVID-19 Vaccine (4 - 2023-24 season) 05/03/2022   Diabetic kidney evaluation - Urine ACR  02/12/2023   Medicare Annual Wellness (AWV)  04/17/2023   INFLUENZA VACCINE  04/03/2023    Colorectal cancer screening: No longer required.   Breast cancer screening: pt declined.  Bone Density status: pt declined.  Lung Cancer Screening: (Low Dose CT Chest recommended if Age 40-80 years, 20 pack-year currently smoking OR have quit w/in 15years.) does not qualify.   Additional Screening:  Hepatitis C Screening: does not qualify  Vision Screening: Recommended annual ophthalmology exams for early detection of glaucoma and other disorders of the eye. Is the patient up to date with their annual eye exam?  No  Who is the provider or what is the name of the office in which the patient attends annual eye exams? Dr. Harlon Flor If pt is not established with a provider, would they like to be referred to a provider to establish care? No .   Dental Screening: Recommended annual dental exams for proper oral hygiene  Diabetic Foot Exam: Diabetic Foot Exam: Completed 01/06/23  Community Resource Referral / Chronic Care Management: CRR required this visit?  No   CCM required this visit?  No     Plan:     I have personally reviewed and noted the following in the patient's chart:   Medical and social history Use of alcohol, tobacco or illicit drugs  Current medications and supplements including opioid prescriptions. Patient is not currently taking opioid prescriptions. Functional ability  and status Nutritional status Physical activity Advanced directives List of other physicians Hospitalizations, surgeries, and ER visits in previous 12 months Vitals Screenings to include cognitive, depression, and falls Referrals and appointments  In addition, I have reviewed and discussed with patient certain preventive protocols, quality metrics, and best practice recommendations. A written personalized care plan for preventive services as well as general preventive health recommendations were provided to patient.     Donne Anon, CMA   04/21/2023   After Visit Summary: printed for pt.  Nurse Notes: None

## 2023-05-02 ENCOUNTER — Other Ambulatory Visit: Payer: Self-pay | Admitting: Family Medicine

## 2023-05-02 DIAGNOSIS — I10 Essential (primary) hypertension: Secondary | ICD-10-CM

## 2023-05-04 ENCOUNTER — Other Ambulatory Visit: Payer: Self-pay | Admitting: Family Medicine

## 2023-05-21 ENCOUNTER — Other Ambulatory Visit: Payer: Self-pay | Admitting: Oncology

## 2023-05-21 DIAGNOSIS — D471 Chronic myeloproliferative disease: Secondary | ICD-10-CM

## 2023-06-13 ENCOUNTER — Inpatient Hospital Stay: Payer: Medicare HMO | Admitting: Oncology

## 2023-06-13 ENCOUNTER — Inpatient Hospital Stay: Payer: Medicare HMO | Attending: Nurse Practitioner

## 2023-06-13 VITALS — BP 135/70 | HR 63 | Temp 98.1°F | Resp 18 | Ht 64.0 in | Wt 124.6 lb

## 2023-06-13 DIAGNOSIS — R251 Tremor, unspecified: Secondary | ICD-10-CM | POA: Insufficient documentation

## 2023-06-13 DIAGNOSIS — I10 Essential (primary) hypertension: Secondary | ICD-10-CM | POA: Diagnosis not present

## 2023-06-13 DIAGNOSIS — D471 Chronic myeloproliferative disease: Secondary | ICD-10-CM

## 2023-06-13 DIAGNOSIS — E119 Type 2 diabetes mellitus without complications: Secondary | ICD-10-CM | POA: Insufficient documentation

## 2023-06-13 DIAGNOSIS — D649 Anemia, unspecified: Secondary | ICD-10-CM | POA: Diagnosis not present

## 2023-06-13 DIAGNOSIS — Z8673 Personal history of transient ischemic attack (TIA), and cerebral infarction without residual deficits: Secondary | ICD-10-CM | POA: Insufficient documentation

## 2023-06-13 DIAGNOSIS — D45 Polycythemia vera: Secondary | ICD-10-CM | POA: Insufficient documentation

## 2023-06-13 LAB — CBC WITH DIFFERENTIAL (CANCER CENTER ONLY)
Abs Immature Granulocytes: 0.04 10*3/uL (ref 0.00–0.07)
Basophils Absolute: 0 10*3/uL (ref 0.0–0.1)
Basophils Relative: 1 %
Eosinophils Absolute: 0.3 10*3/uL (ref 0.0–0.5)
Eosinophils Relative: 3 %
HCT: 44.1 % (ref 36.0–46.0)
Hemoglobin: 14.4 g/dL (ref 12.0–15.0)
Immature Granulocytes: 1 %
Lymphocytes Relative: 22 %
Lymphs Abs: 1.6 10*3/uL (ref 0.7–4.0)
MCH: 31.6 pg (ref 26.0–34.0)
MCHC: 32.7 g/dL (ref 30.0–36.0)
MCV: 96.7 fL (ref 80.0–100.0)
Monocytes Absolute: 0.6 10*3/uL (ref 0.1–1.0)
Monocytes Relative: 8 %
Neutro Abs: 4.9 10*3/uL (ref 1.7–7.7)
Neutrophils Relative %: 65 %
Platelet Count: 601 10*3/uL — ABNORMAL HIGH (ref 150–400)
RBC: 4.56 MIL/uL (ref 3.87–5.11)
RDW: 14.8 % (ref 11.5–15.5)
WBC Count: 7.5 10*3/uL (ref 4.0–10.5)
nRBC: 0 % (ref 0.0–0.2)

## 2023-06-13 MED ORDER — HYDROXYUREA 500 MG PO CAPS: ORAL_CAPSULE | ORAL | 1 refills | Status: DC

## 2023-06-13 NOTE — Addendum Note (Signed)
Addended by: Wandalee Ferdinand on: 06/13/2023 04:27 PM   Modules accepted: Orders

## 2023-06-13 NOTE — Progress Notes (Signed)
  [facility] OFFICE PROGRESS NOTE   Diagnosis: Polycythemia vera  INTERVAL HISTORY:   Ms. Gregory returns as scheduled.  She continues hydroxyurea on Monday-Thursday.  No bleeding or symptom of thrombosis.  No mouth sores.  She has a tremor of the right hand.  Objective:  Vital signs in last 24 hours:  Blood pressure 135/70, pulse 63, temperature 98.1 F (36.7 C), temperature source Temporal, resp. rate 18, height 5\' 4"  (1.626 m), weight 124 lb 9.6 oz (56.5 kg), SpO2 100%.    HEENT: No thrush or ulcers Resp: Lungs clear bilaterally Cardio: Regular rate and rhythm GI: No hepatosplenomegaly Vascular: No leg edema    Lab Results:  Lab Results  Component Value Date   WBC 7.5 06/13/2023   HGB 14.4 06/13/2023   HCT 44.1 06/13/2023   MCV 96.7 06/13/2023   PLT 601 (H) 06/13/2023   NEUTROABS 4.9 06/13/2023    CMP  Lab Results  Component Value Date   NA 141 03/21/2023   K 4.4 03/21/2023   CL 109 03/21/2023   CO2 25 03/21/2023   GLUCOSE 228 (H) 03/21/2023   BUN 30 (H) 03/21/2023   CREATININE 1.93 (H) 03/21/2023   CALCIUM 9.2 03/21/2023   PROT 6.5 03/21/2023   ALBUMIN 4.0 03/21/2023   AST 19 03/21/2023   ALT 25 03/21/2023   ALKPHOS 52 03/21/2023   BILITOT 0.9 03/21/2023   GFRNONAA 26 (L) 03/21/2023   GFRAA 24 (L) 06/10/2019    Medications: I have reviewed the patient's current medications.   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 Sadiq appears stable.  The hemoglobin and platelet count are higher.  She will increase the hydroxyurea to 500 mg daily.  She will return for a lab visit in 2 months and an office visit in 4 months.  Thornton Papas, MD  06/13/2023  11:21 AM

## 2023-06-20 ENCOUNTER — Other Ambulatory Visit: Payer: Medicare HMO

## 2023-06-20 ENCOUNTER — Ambulatory Visit: Payer: Medicare HMO | Admitting: Oncology

## 2023-07-04 ENCOUNTER — Ambulatory Visit: Payer: Medicare HMO | Admitting: Internal Medicine

## 2023-07-04 ENCOUNTER — Encounter: Payer: Self-pay | Admitting: Internal Medicine

## 2023-07-04 VITALS — BP 138/70 | HR 73 | Ht 64.0 in | Wt 123.0 lb

## 2023-07-04 DIAGNOSIS — E1165 Type 2 diabetes mellitus with hyperglycemia: Secondary | ICD-10-CM

## 2023-07-04 DIAGNOSIS — E1169 Type 2 diabetes mellitus with other specified complication: Secondary | ICD-10-CM | POA: Diagnosis not present

## 2023-07-04 DIAGNOSIS — E785 Hyperlipidemia, unspecified: Secondary | ICD-10-CM | POA: Diagnosis not present

## 2023-07-04 DIAGNOSIS — Z8639 Personal history of other endocrine, nutritional and metabolic disease: Secondary | ICD-10-CM | POA: Diagnosis not present

## 2023-07-04 DIAGNOSIS — Z794 Long term (current) use of insulin: Secondary | ICD-10-CM

## 2023-07-04 DIAGNOSIS — Z7984 Long term (current) use of oral hypoglycemic drugs: Secondary | ICD-10-CM | POA: Diagnosis not present

## 2023-07-04 LAB — HEMOGLOBIN A1C: Hemoglobin A1C: 9.3

## 2023-07-04 LAB — TSH: TSH: 0.71 u[IU]/mL (ref 0.35–5.50)

## 2023-07-04 MED ORDER — TRUE METRIX BLOOD GLUCOSE TEST VI STRP: ORAL_STRIP | 3 refills | Status: AC

## 2023-07-04 MED ORDER — TRUE METRIX METER W/DEVICE KIT: PACK | 0 refills | Status: AC

## 2023-07-04 NOTE — Progress Notes (Signed)
Patient ID: Brandi Mccormick, female   DOB: 1940-11-22, 82 y.o.   MRN: [id number]  HPI: Brandi Mccormick is a 82 y.o.-year-old female, presenting for f/u for DM2, dx 2013, non-insulin-dependent, uncontrolled, with complications (CVA 10/2020, microalbuminuria) and h/o hyperthyroidism. Last visit 4 mo ago.  Interim history: No increased urination, blurry vision, nausea, chest pain. She greatly reduced sweet drinks and she also mentions that she is eating less sweets than before. She did not change her meter as advised at last visit.  Also, she did not start insulin as advised.  DM2: Reviewed HbA1c levels: Lab Results  Component Value Date   HGBA1C 9.0 (A) 02/27/2023   HGBA1C 8.1 (A) 10/28/2022   HGBA1C 8.4 (A) 06/27/2022   HGBA1C 7.2 (H) 02/11/2022   HGBA1C 7.1 (A) 12/25/2021   HGBA1C 7.9 (A) 07/11/2021   HGBA1C 6.9 (A) 03/07/2021   HGBA1C 8.0 (H) 10/07/2020   HGBA1C 7.3 (A) 06/29/2020   HGBA1C 7.2 (A) 02/24/2020   HGBA1C 7.2 (H) 11/04/2019   HGBA1C 6.6 (H) 05/06/2019   HGBA1C 6.1 12/21/2018   HGBA1C 8.3 (H) 09/11/2018   HGBA1C 6.9 (A) 06/04/2018  07/11/2021: HbA1c calculated from fructosamine is 9.5%, even higher than the directly measured HbA1c. 11/02/2020: HbA1c calculated from fructosamine: 10%, even higher than the directly measured HbA1c.  Pt is on a regimen of: - Glipizide XL 5 mg in a.m.  >> 10 (2x 5 mg) mg in a.m.  Lantus 8 units at bedtime - was rec'd 02/2023 >> but did not start! She was previously on Janumet XR and Metformin ER, but this was stopped 05/2019 due to declining kidney function. Tradjenta was too expensive.  Pt checks her sugars once a day per review of her  log: - am:  131-156 >> 90, 99-141, 144 >> 100-140s >> 84, 111-130 - 2h after b'fast: 124 >> 151 >> 140, 152 >> n/c >> 120, 121 - before lunch: 123, 137 >> n/c >> 133, 135 >> 140 >> n/c >> 124 - 2h after lunch: 113-147 >> 137 >> 140-153 >> 110 >> n/c >> 130 - before dinner: 109-125 >> 87-136, 140 >>  90s-130s >> 116, 133 - 2h after dinner: 109-130 >> 145, 150 >> 136-144 >> same - bedtime:  99 >> 108, 138 >> 78-133 >> see above - nighttime: n/c >> 161 >> n/c Lowest sugar: 85 >> 81 >> 78 >> 90s >> 84; it is unclear at which level she has hypoglycemia awareness. Highest sugar: 309 ([facility]; since then 151) ... >> 200s >> 148.  Meter: OneTouch Verio.  Pt's meals are: - Breakfast: egg, coffee, juice - Lunch: No Lunch - Dinner: meat + vegetables >> late dinner during the weekend as her son is cooking and brings her food - Snacks: 1-2  -+ CKD (FSGS-sees nephrology), last BUN/creatinine:  Lab Results  Component Value Date   BUN 30 (H) 03/21/2023   CREATININE 1.93 (H) 03/21/2023   Lab Results  Component Value Date   GFRAA 24 (L) 06/10/2019   GFRAA 27 (L) 04/28/2019   GFRAA 43 (L) 07/27/2018   GFRAA >60 06/26/2018   GFRAA 38 (L) 06/02/2018   GFRAA 82 06/28/2016   GFRAA >90 06/25/2014   GFRAA >90 06/24/2014   GFRAA >90 10/23/2013  12/21/2018: GFR 28.9   She has MAU: Lab Results  Component Value Date   MICRALBCREAT 6.3 02/11/2022   MICRALBCREAT 43.8 (H) 11/04/2019   MICRALBCREAT 77.6 (H) 05/06/2019   MICRALBCREAT 422.7 (H) 04/02/2018   MICRALBCREAT  225.8 (H) 11/15/2016   MICRALBCREAT 48.8 (H) 06/28/2016   MICRALBCREAT 9.9 02/21/2014   MICRALBCREAT 8.8 11/16/2013  On lisinopril 40.  -+ CKD; last set of lipids: Lab Results  Component Value Date   CHOL 166 01/06/2023   HDL 55.70 01/06/2023   LDLCALC 89 01/06/2023   TRIG 104.0 01/06/2023   CHOLHDL 3 01/06/2023  Prev. on Simvastatin >> off in 06/2016 b/c fatigue >> pravastatin 80 mg daily in 12/2016 >> Crestor 40 >> Lipitor 80. Zetia was added.  - no numbness and tingling in her feet.  + Onychomycosis.  Latest foot exam: 01/06/2023 by podiatry.  - Last eye exam in 09/2019: No DR reportedly; Had OS cataract sx.   H/o hyperthyroidism - previously treated with medication, more than 20 years ago.  No other history  available.  Pt denies: - feeling nodules in neck - hoarseness - dysphagia - choking  Her TFTs were normal: Lab Results  Component Value Date   TSH 0.58 10/28/2022   TSH 0.72 07/11/2021   TSH 0.37 11/02/2020   TSH 1.12 05/12/2019   TSH 0.635 06/26/2018   TSH 1.11 04/02/2018   TSH 1.13 07/03/2017   TSH 1.12 06/28/2016   TSH 1.29 06/23/2015   TSH 1.44 10/18/2014   FREET4 1.04 07/11/2021   FREET4 0.85 05/12/2019   FREET4 0.93 07/03/2017   FREET4 0.96 06/28/2016   FREET4 0.91 08/15/2014   FREET4 1.00 02/17/2014   FREET4 0.97 12/20/2013   T3FREE 2.6 07/11/2021   T3FREE 2.7 05/12/2019   T3FREE 3.2 07/03/2017   T3FREE 3.1 06/28/2016   T3FREE 3.2 08/15/2014   T3FREE 2.6 02/17/2014   T3FREE 2.9 12/20/2013   She had a CVA on 10/06/2020 >> had  PT. She also has a history of gout. She has chronic myeloproliferative ds.  She is on hydroxyurea. 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  ROS: + see HPI  I reviewed pt's medications, allergies, PMH, social hx, family hx, and changes were documented in the history of present illness. Otherwise, unchanged from my initial visit note.  Past Medical History:  Diagnosis Date   CVA (cerebral vascular accident) (HCC) 10/2020   Diabetes (HCC)    GERD (gastroesophageal reflux disease)    Hyperlipidemia    Hypertension    Hyperthyroidism    Pneumonia    Polycythemia    Shingles    Past Surgical History:  Procedure Laterality Date   CATARACT EXTRACTION Left 2020   COLONOSCOPY     DENTAL SURGERY     IR ANGIO INTRA EXTRACRAN SEL COM CAROTID INNOMINATE BILAT MOD SED  10/09/2020   IR ANGIO VERTEBRAL SEL VERTEBRAL BILAT MOD SED  10/09/2020   Social History   Socioeconomic History   Marital status: Married    Spouse name: Not on file   Number of children: Not on file   Years of education: Not on file   Highest education level: Not on file  Occupational  History   Occupation: [employer]--    Comment: retired  Tobacco Use   Smoking status: Never   Smokeless tobacco: Never  Vaping Use   Vaping status: Never Used  Substance and Sexual Activity   Alcohol use: Yes    Alcohol/week: 0.0 standard drinks of alcohol    Comment: occasional wine cooler   Drug use: No   Sexual activity: Not Currently    Partners: Male  Other Topics Concern  Not on file  Social History Narrative   Exercise-- yard work,  Walking in [employer]   03/15/21 lives with husband   Social Determinants of Health   Financial Resource Strain: Low Risk  (04/21/2023)   Overall Financial Resource Strain (CARDIA)    Difficulty of Paying Living Expenses: Not hard at all  Food Insecurity: No Food Insecurity (04/21/2023)   Hunger Vital Sign    Worried About Running Out of Food in the Last Year: Never true    Ran Out of Food in the Last Year: Never true  Transportation Needs: No Transportation Needs (04/21/2023)   PRAPARE - Administrator, Civil Service (Medical): No    Lack of Transportation (Non-Medical): No  Physical Activity: Inactive (04/21/2023)   Exercise Vital Sign    Days of Exercise per Week: 0 days    Minutes of Exercise per Session: 0 min  Stress: No Stress Concern Present (04/21/2023)   [employer] of Occupational Health - Occupational Stress Questionnaire    Feeling of Stress : Not at all  Social Connections: Moderately Isolated (04/21/2023)   Social Connection and Isolation Panel [NHANES]    Frequency of Communication with Friends and Family: More than three times a week    Frequency of Social Gatherings with Friends and Family: Three times a week    Attends Religious Services: Never    Active Member of Clubs or Organizations: No    Attends Banker Meetings: Never    Marital Status: Married  Catering manager Violence: Not At Risk (04/21/2023)   Humiliation, Afraid, Rape, and Kick questionnaire    Fear of Current or Ex-Partner: No     Emotionally Abused: No    Physically Abused: No    Sexually Abused: No   Current Outpatient Medications on File Prior to Visit  Medication Sig Dispense Refill   amLODipine (NORVASC) 10 MG tablet Take 1 tablet by mouth once daily 90 tablet 0   atorvastatin (LIPITOR) 80 MG tablet Take 1 tablet (80 mg total) by mouth daily. 90 tablet 1   Blood Glucose Monitoring Suppl (ONETOUCH VERIO) w/Device KIT Use as instructed to check blood sugar TWICE DAILY 1 kit 0   clopidogrel (PLAVIX) 75 MG tablet TAKE 1 TABLET BY MOUTH ONCE DAILY . APPOINTMENT REQUIRED FOR FUTURE REFILLS 15 tablet 0   ezetimibe (ZETIA) 10 MG tablet Take 1 tablet (10 mg total) by mouth daily. 90 tablet 1   glipiZIDE (GLUCOTROL XL) 5 MG 24 hr tablet Take 2 tablets (10 mg total) by mouth daily. 180 tablet 3   glucose blood (ONETOUCH VERIO) test strip USE 1 STRIP TO CHECK GLUCOSE TWICE DAILY 200 each 0   hydroxyurea (HYDREA) 500 MG capsule TAKE 1 CAPSULE BY MOUTH ONCE DAILY WITH FOOD ON MONDAY - THURSDAY 90 capsule 1   Lancets (ONETOUCH DELICA PLUS LANCET33G) MISC USE 1  TO CHECK GLUCOSE TWICE DAILY 200 each 3   lisinopril (ZESTRIL) 40 MG tablet Take 1 tablet by mouth once daily 90 tablet 0   metoprolol succinate (TOPROL-XL) 25 MG 24 hr tablet Take 1 tablet (25 mg total) by mouth daily. Take with or immediately following a meal 90 tablet 1   pantoprazole (PROTONIX) 40 MG tablet Take 1 tablet by mouth once daily 90 tablet 0   polyethylene glycol (MIRALAX) 17 g packet Take 17 g by mouth daily as needed. 14 each 0   No current facility-administered medications on file prior to visit.   Allergies  Allergen Reactions  Penicillins Nausea And Vomiting    Has patient had a PCN reaction causing immediate rash, facial/tongue/throat swelling, SOB or lightheadedness with hypotension: no Has patient had a PCN reaction causing severe rash involving mucus membranes or skin necrosis: no Has patient had a PCN reaction that required hospitalization:  no Has patient had a PCN reaction occurring within the last 10 years: no If all of the above answers are "NO", then may proceed with Cephalosporin use.    Family History  Problem Relation Age of Onset   Diabetes Mother        and on mother's side of family   Colon cancer Mother    Stroke Mother    Diabetes Father        and on father's side of family   Diabetes Sister    Diabetes Sister    Diabetes Brother    Heart disease Neg Hx    Rectal cancer Neg Hx    Stomach cancer Neg Hx    PE: BP 138/70   Pulse 73   Ht 5\' 4"  (1.626 m)   Wt 123 lb (55.8 kg)   SpO2 97%   BMI 21.11 kg/m   Wt Readings from Last 3 Encounters:  07/04/23 123 lb (55.8 kg)  06/13/23 124 lb 9.6 oz (56.5 kg)  04/21/23 124 lb (56.2 kg)   Constitutional:  Normal weight, in NAD Eyes: PERRLA, EOMI, no exophthalmos ENT: no thyromegaly, no cervical lymphadenopathy Cardiovascular: RRR, No MRG Respiratory: CTA B Musculoskeletal: no deformities Skin:  no rashes Neurological: no tremor with outstretched hands  ASSESSMENT: 1. DM2, non-insulin-dependent, uncontrolled, with complications - CVA 10/2020 - MAU  2. H/o Hyperthyroidism - >20 years ago, resolved since - was on meds, cannot remember name  3. HL  PLAN:  1. Patient with history of uncontrolled type 2 diabetes, on sulfonylurea to which I suggested long-acting insulin at last visit.  At that time, she had a higher HbA1c, of 9%.  Sugars at home were apparently at goal, however, she was fasting at last visit and a CBG checked in the office was 220.  We discussed about changing her glucometer.  I also strongly advised her to stop juice, sweet tea, regular sodas or any other sweet drinks. -At today's visit, however, she did not start insulin as advised as she would not want to take insulin if not absolutely necessary.  I advised that it is absolutely necessary for her now.  We will check her insulin production and at the pancreatic antibodies but based on the  blood sugar pattern and body habitus, she does need at least a low-dose of basal insulin for now.  Moreover, she did not change her meter or test to since last visit.  We discussed that her meter/test strips are showing her much lower blood sugars than here in clinic or as extrapolated from the HbA1c.  I called in a prescription for true Metrix meter + test strips for her. -We discussed that unfortunately, I do not feel that other regimens are safe for her  -I advised her to: Patient Instructions  Please continue: - Glipizide XL 10 mg before breakfast  Please start: -  Lantus 8 units at bedtime. If needed, increase the dose by 2 units every 2 days until sugars in am are <140.  Schedule a new eye exam.  Please stop at the lab.   Try to get a new meter.  Please return in 2 months with your sugar log.   - we  checked her HbA1c: 9.3% (higher) - advised to check sugars at different times of the day - 1x a day, rotating check times - advised for yearly eye exams >> she is not UTD - will check an ACR - return to clinic in 3-4 months  2. H/o hyperthyroidism -No thyrotoxic signs or symptoms -TSH remains normal: Lab Results  Component Value Date   TSH 0.58 10/28/2022  - will repeat her TSH now  3. HL -Reviewed latest lipid panel from 01/2023: LDL above our target of less than 55 due to cardiovascular disease, otherwise fractions at goal: Lab Results  Component Value Date   CHOL 166 01/06/2023   HDL 55.70 01/06/2023   LDLCALC 89 01/06/2023   TRIG 104.0 01/06/2023   CHOLHDL 3 01/06/2023  -On Lipitor 80 mg daily and Zetia 10 mg daily without side effects  Orders Placed This Encounter  Procedures   Glutamic acid decarboxylase auto abs   ZNT8 Antibodies   IA-2 Antibody   Insulin antibodies, blood   C-peptide   Glucose, fasting   TSH   Microalbumin / creatinine urine ratio   Carlus Pavlov, MD PhD [facility] Endocrin[facility]

## 2023-07-04 NOTE — Patient Instructions (Addendum)
Please continue: - Glipizide XL 10 mg before breakfast  Please start: -  Lantus 8 units at bedtime. If needed, increase the dose by 2 units every 2 days until sugars in am are <140.  Schedule a new eye exam.  Please stop at the lab.   Try to get a new meter.  Please return in 2 months with your sugar log.

## 2023-07-08 ENCOUNTER — Encounter: Payer: Self-pay | Admitting: Podiatry

## 2023-07-08 ENCOUNTER — Ambulatory Visit: Payer: Medicare HMO | Admitting: Podiatry

## 2023-07-08 DIAGNOSIS — E119 Type 2 diabetes mellitus without complications: Secondary | ICD-10-CM

## 2023-07-08 DIAGNOSIS — M79675 Pain in left toe(s): Secondary | ICD-10-CM

## 2023-07-08 DIAGNOSIS — B351 Tinea unguium: Secondary | ICD-10-CM | POA: Diagnosis not present

## 2023-07-08 DIAGNOSIS — M79674 Pain in right toe(s): Secondary | ICD-10-CM

## 2023-07-11 NOTE — Progress Notes (Signed)
  Subjective:  Patient ID: Brandi Mccormick, female    DOB: 1940-10-20,  MRN: [id number]  82 y.o. female presents with preventative 82 y.o. female presents diabetic foot care and painful thick toenails that are difficult to trim. Pain interferes with ambulation. Aggravating factors include wearing enclosed shoe gear. Pain is relieved with periodic professional debridement. Chief Complaint  Patient presents with   Diabetes    [facility] BS - DIDN'T CHECK IT THIS MORNING A1C - DK  [facility]P - 07/04/2023    PCP: Zola Button, Grayling Congress, DO.  New problem(s): None.   Review of Systems: Negative except as noted in the HPI.   Allergies  Allergen Reactions   Penicillins Nausea And Vomiting    Has patient had a PCN reaction causing immediate rash, facial/tongue/throat swelling, SOB or lightheadedness with hypotension: no Has patient had a PCN reaction causing severe rash involving mucus membranes or skin necrosis: no Has patient had a PCN reaction that required hospitalization: no Has patient had a PCN reaction occurring within the last 10 years: no If all of the above answers are "NO", then may proceed with Cephalosporin use.     Objective:  There were no vitals filed for this visit. Constitutional Patient is a pleasant 82 y.o. African American female WD, WN in NAD. AAO x 3.  Vascular Capillary fill time to digits <3 seconds.  DP/PT pulse(s) are faintly palpable b/l lower extremities. Pedal hair absent b/l. Lower extremity skin temperature gradient warm to cool b/l. No pain with calf compression b/l. No cyanosis or clubbing noted. No ischemia nor gangrene noted b/l.   Neurologic Protective sensation intact 5/5 intact bilaterally with 10g monofilament b/l. Vibratory sensation intact b/l. No clonus b/l.   Dermatologic Pedal skin is thin, shiny and atrophic b/l.  No open wounds b/l lower extremities. No interdigital macerations b/l lower extremities. Toenails 1-5 b/l elongated, discolored, dystrophic, thickened, crumbly with  subungual debris and tenderness to dorsal palpation. No corns, calluses nor porokeratotic lesions noted.  Orthopedic: Normal muscle strength 5/5 to all lower extremity muscle groups bilaterally. Hammertoe deformity noted 2-5 b/l. Pes planus deformity noted bilateral LE. Patient ambulates independent of any assistive aids.   Last HgA1c:     Latest Ref Rng & Units 02/27/2023    4:04 PM 10/28/2022   10:34 AM  Hemoglobin A1C  Hemoglobin-A1c 4.0 - 5.6 % 9.0  8.1      Assessment:   1. Pain due to onychomycosis of toenails of both feet   2. Controlled type 2 diabetes mellitus without complication, without long-term current use of insulin (HCC)    Plan:  Patient was evaluated and treated and all questions answered. Consent given for treatment as described below: -No new findings. No new orders. -Continue foot and shoe inspections daily. Monitor blood glucose per PCP/Endocrinologist's recommendations. -Patient to continue soft, supportive shoe gear daily. -Toenails 1-5 b/l were debrided in length and girth with sterile nail nippers and dremel without iatrogenic bleeding.  -Patient/POA to call should there be question/concern in the interim.  Return in about 3 months (around 10/08/2023).  Freddie Breech, DPM

## 2023-07-15 ENCOUNTER — Other Ambulatory Visit: Payer: Self-pay | Admitting: Family Medicine

## 2023-07-18 LAB — GLUTAMIC ACID DECARBOXYLASE AUTO ABS: Glutamic Acid Decarb Ab: <5 [IU]/mL (ref <5)

## 2023-07-18 LAB — ZNT8 ANTIBODIES: ZNT8 Antibodies: <10 U/mL (ref <15)

## 2023-07-18 LAB — C-PEPTIDE: C-Peptide: 3.43 ng/mL (ref 0.80–3.85)

## 2023-07-18 LAB — GLUCOSE, FASTING: Glucose, Bld: 153 mg/dL — ABNORMAL HIGH (ref 65–99)

## 2023-07-18 LAB — INSULIN ANTIBODIES, BLOOD: Insulin Antibodies, Human: <0.4 U/mL (ref <0.4)

## 2023-07-18 LAB — IA-2 ANTIBODY: IA-2 Antibody: <5.4 U/mL (ref <5.4)

## 2023-07-21 ENCOUNTER — Telehealth: Payer: Self-pay

## 2023-07-21 NOTE — Telephone Encounter (Signed)
VM left with female family member requesting callback to give results of labs.

## 2023-07-21 NOTE — Telephone Encounter (Signed)
-----   Message from Carlus Pavlov sent at 07/18/2023  8:24 AM EST ----- The last pancreatic antibody has also returned and this is not elevated.  This is good news.

## 2023-08-04 ENCOUNTER — Other Ambulatory Visit: Payer: Self-pay | Admitting: Internal Medicine

## 2023-08-13 ENCOUNTER — Inpatient Hospital Stay: Payer: Medicare HMO

## 2023-08-13 ENCOUNTER — Telehealth: Payer: Self-pay

## 2023-08-13 ENCOUNTER — Inpatient Hospital Stay: Payer: Medicare HMO | Attending: Nurse Practitioner

## 2023-08-13 DIAGNOSIS — D45 Polycythemia vera: Secondary | ICD-10-CM | POA: Diagnosis not present

## 2023-08-13 DIAGNOSIS — D471 Chronic myeloproliferative disease: Secondary | ICD-10-CM

## 2023-08-13 LAB — CBC WITH DIFFERENTIAL (CANCER CENTER ONLY)
Abs Immature Granulocytes: 0.01 10*3/uL (ref 0.00–0.07)
Basophils Absolute: 0 10*3/uL (ref 0.0–0.1)
Basophils Relative: 1 %
Eosinophils Absolute: 0.1 10*3/uL (ref 0.0–0.5)
Eosinophils Relative: 3 %
HCT: 39 % (ref 36.0–46.0)
Hemoglobin: 13 g/dL (ref 12.0–15.0)
Immature Granulocytes: 0 %
Lymphocytes Relative: 26 %
Lymphs Abs: 1.3 10*3/uL (ref 0.7–4.0)
MCH: 32.1 pg (ref 26.0–34.0)
MCHC: 33.3 g/dL (ref 30.0–36.0)
MCV: 96.3 fL (ref 80.0–100.0)
Monocytes Absolute: 0.5 10*3/uL (ref 0.1–1.0)
Monocytes Relative: 11 %
Neutro Abs: 2.8 10*3/uL (ref 1.7–7.7)
Neutrophils Relative %: 59 %
Platelet Count: 389 10*3/uL (ref 150–400)
RBC: 4.05 MIL/uL (ref 3.87–5.11)
RDW: 16.1 % — ABNORMAL HIGH (ref 11.5–15.5)
WBC Count: 4.8 10*3/uL (ref 4.0–10.5)
nRBC: 0 % (ref 0.0–0.2)

## 2023-08-13 NOTE — Telephone Encounter (Signed)
-----   Message from Thornton Papas sent at 08/13/2023  3:40 PM EST ----- Please call patient with the platelet count and hemoglobin are lower, continue hydroxyurea at the current dose, follow-up as scheduled

## 2023-08-13 NOTE — Telephone Encounter (Signed)
Patient gave verbal understanding and had no further questions or concerns  

## 2023-08-20 ENCOUNTER — Other Ambulatory Visit: Payer: Self-pay | Admitting: Family Medicine

## 2023-08-28 ENCOUNTER — Ambulatory Visit (INDEPENDENT_AMBULATORY_CARE_PROVIDER_SITE_OTHER): Payer: Medicare HMO | Admitting: Family Medicine

## 2023-08-28 ENCOUNTER — Encounter: Payer: Self-pay | Admitting: Family Medicine

## 2023-08-28 VITALS — BP 139/89 | HR 70 | Temp 97.8°F | Resp 16 | Ht 64.0 in | Wt 122.0 lb

## 2023-08-28 DIAGNOSIS — Z0000 Encounter for general adult medical examination without abnormal findings: Secondary | ICD-10-CM

## 2023-08-28 DIAGNOSIS — E1169 Type 2 diabetes mellitus with other specified complication: Secondary | ICD-10-CM

## 2023-08-28 DIAGNOSIS — I152 Hypertension secondary to endocrine disorders: Secondary | ICD-10-CM

## 2023-08-28 DIAGNOSIS — E1165 Type 2 diabetes mellitus with hyperglycemia: Secondary | ICD-10-CM

## 2023-08-28 DIAGNOSIS — Z8673 Personal history of transient ischemic attack (TIA), and cerebral infarction without residual deficits: Secondary | ICD-10-CM

## 2023-08-28 DIAGNOSIS — E785 Hyperlipidemia, unspecified: Secondary | ICD-10-CM | POA: Diagnosis not present

## 2023-08-28 DIAGNOSIS — E1159 Type 2 diabetes mellitus with other circulatory complications: Secondary | ICD-10-CM

## 2023-08-28 DIAGNOSIS — D469 Myelodysplastic syndrome, unspecified: Secondary | ICD-10-CM | POA: Diagnosis not present

## 2023-08-28 DIAGNOSIS — I639 Cerebral infarction, unspecified: Secondary | ICD-10-CM

## 2023-08-28 LAB — COMPREHENSIVE METABOLIC PANEL
ALT: 35 U/L (ref 0–35)
AST: 25 U/L (ref 0–37)
Albumin: 3.9 g/dL (ref 3.5–5.2)
Alkaline Phosphatase: 67 U/L (ref 39–117)
BUN: 35 mg/dL — ABNORMAL HIGH (ref 6–23)
CO2: 25 meq/L (ref 19–32)
Calcium: 9.2 mg/dL (ref 8.4–10.5)
Chloride: 107 meq/L (ref 96–112)
Creatinine, Ser: 1.59 mg/dL — ABNORMAL HIGH (ref 0.40–1.20)
GFR: 30.2 mL/min — ABNORMAL LOW (ref >60.00)
Glucose, Bld: 225 mg/dL — ABNORMAL HIGH (ref 70–99)
Potassium: 4.9 meq/L (ref 3.5–5.1)
Sodium: 140 meq/L (ref 135–145)
Total Bilirubin: 0.6 mg/dL (ref 0.2–1.2)
Total Protein: 6.3 g/dL (ref 6.0–8.3)

## 2023-08-28 LAB — TSH: TSH: 0.73 u[IU]/mL (ref 0.35–5.50)

## 2023-08-28 LAB — CBC WITH DIFFERENTIAL/PLATELET
Basophils Absolute: 0 10*3/uL (ref 0.0–0.1)
Basophils Relative: 0.9 % (ref 0.0–3.0)
Eosinophils Absolute: 0.1 10*3/uL (ref 0.0–0.7)
Eosinophils Relative: 2.4 % (ref 0.0–5.0)
HCT: 40.5 % (ref 36.0–46.0)
Hemoglobin: 13.4 g/dL (ref 12.0–15.0)
Lymphocytes Relative: 32.3 % (ref 12.0–46.0)
Lymphs Abs: 1.4 10*3/uL (ref 0.7–4.0)
MCHC: 33.1 g/dL (ref 30.0–36.0)
MCV: 99.9 fL (ref 78.0–100.0)
Monocytes Absolute: 0.5 10*3/uL (ref 0.1–1.0)
Monocytes Relative: 10.8 % (ref 3.0–12.0)
Neutro Abs: 2.3 10*3/uL (ref 1.4–7.7)
Neutrophils Relative %: 53.6 % (ref 43.0–77.0)
Platelets: 438 10*3/uL — ABNORMAL HIGH (ref 150.0–400.0)
RBC: 4.05 Mil/uL (ref 3.87–5.11)
RDW: 19 % — ABNORMAL HIGH (ref 11.5–15.5)
WBC: 4.3 10*3/uL (ref 4.0–10.5)

## 2023-08-28 LAB — LIPID PANEL
Cholesterol: 157 mg/dL (ref 0–200)
HDL: 56.1 mg/dL (ref >39.00)
LDL Cholesterol: 87 mg/dL (ref 0–99)
NonHDL: 101.06
Total CHOL/HDL Ratio: 3
Triglycerides: 72 mg/dL (ref 0.0–149.0)
VLDL: 14.4 mg/dL (ref 0.0–40.0)

## 2023-08-28 NOTE — Progress Notes (Signed)
Established Patient Office Visit  Subjective   Patient ID: Brandi Mccormick, female    DOB: 08-23-1941  Age: 82 y.o. MRN: [id number]  Chief Complaint  Patient presents with   Annual Exam    HPI Discussed the use of AI scribe software for clinical note transcription with the patient, who gave verbal consent to proceed.  History of Present Illness   The patient, with a history of diabetes, hypertension, and a hematological condition managed by Dr. Tora Duck with hydroxyurea, presented for a routine physical. She reported no new health issues and denied any changes in family history or surgical history. She has been adherent to her current medication regimen, which includes amlodipine, azetamide, metoprolol, atorvastatin, lisinopril, clopidogrel, and glipizide.  The patient admitted to not monitoring her blood sugars recently due to the holiday season and planned to resume after her upcoming birthday. She has not been to the eye doctor recently but plans to go when the weather warms up. She also reported being physically active, walking in her backyard and around stores.  The patient has upcoming appointments with her endocrinologist in March, a specialist named Dr. Donzetta Matters in late February, and an oncologist in early February. She declined any vaccines, mammograms, and DEXA scans. She is scheduled for lab work today.      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    Pneumonia    Hypertension associated with diabetes (HCC) 01/14/2017   Hyperlipidemia 11/15/2016   Polycythemia vera (HCC) 07/01/2014   Protein-calorie malnutrition, severe (HCC) 06/25/2014   Protein-calorie malnutrition (HCC) 06/24/2014   Hypokalemia 06/24/2014   CAP (community acquired pneumonia) 06/24/2014   H/O hyperthyroidism 12/21/2013   Rash 01/24/2013   Past Medical History:  Diagnosis Date   CVA (cerebral vascular accident) (HCC) 10/2020   Diabetes (HCC)    GERD (gastroesophageal reflux disease)    Hyperlipidemia    Hypertension    Hyperthyroidism    Pneumonia    Polycythemia    Shingles    Past Surgical History:  Procedure Laterality Date   CATARACT EXTRACTION Left 2020   COLONOSCOPY     DENTAL SURGERY     IR ANGIO INTRA EXTRACRAN SEL COM CAROTID INNOMINATE BILAT MOD SED  10/09/2020   IR ANGIO VERTEBRAL SEL VERTEBRAL BILAT MOD SED  10/09/2020   Social History   Tobacco Use   Smoking status: Never   Smokeless tobacco: Never  Vaping Use   Vaping status: Never Used  Substance Use Topics   Alcohol use: Yes    Alcohol/week: 0.0 standard drinks of alcohol    Comment: occasional wine cooler   Drug use: No   Social History   Socioeconomic History   Marital status: Married    Spouse name: Not on file   Number of children: Not on file   Years of education: Not on file   Highest education level: Not on file  Occupational History   Occupation: [employer]--    Comment: retired  Tobacco Use   Smoking status: Never   Smokeless tobacco:  Never  Vaping Use   Vaping status: Never Used  Substance and Sexual Activity   Alcohol use: Yes    Alcohol/week: 0.0 standard drinks of alcohol    Comment: occasional wine cooler   Drug use: No   Sexual activity: Not Currently    Partners: Male  Other Topics Concern   Not on file  Social History Narrative   Exercise-- yard work,  Walking in [employer]   03/15/21 lives with husband   Social Drivers of Corporate investment banker  Strain: Low Risk  (04/21/2023)   Overall Financial Resource Strain (CARDIA)    Difficulty of Paying Living Expenses: Not hard at all  Food Insecurity: No Food Insecurity (04/21/2023)   Hunger Vital Sign    Worried About Running Out of Food in the Last Year: Never true    Ran Out of Food in the Last Year: Never true  Transportation Needs: No Transportation Needs (04/21/2023)   PRAPARE - Administrator, Civil Service (Medical): No    Lack of Transportation (Non-Medical): No  Physical Activity: Inactive (04/21/2023)   Exercise Vital Sign    Days of Exercise per Week: 0 days    Minutes of Exercise per Session: 0 min  Stress: No Stress Concern Present (04/21/2023)   [employer] of Occupational Health - Occupational Stress Questionnaire    Feeling of Stress : Not at all  Social Connections: Moderately Isolated (04/21/2023)   Social Connection and Isolation Panel [NHANES]    Frequency of Communication with Friends and Family: More than three times a week    Frequency of Social Gatherings with Friends and Family: Three times a week    Attends Religious Services: Never    Active Member of Clubs or Organizations: No    Attends Banker Meetings: Never    Marital Status: Married  Catering manager Violence: Not At Risk (04/21/2023)   Humiliation, Afraid, Rape, and Kick questionnaire    Fear of Current or Ex-Partner: No    Emotionally Abused: No    Physically Abused: No    Sexually Abused: No   Family Status  Relation Name Status   Mother  Deceased   Father  Deceased   Sister  Alive   Sister  Alive   Brother  Deceased   Neg Hx  (Not Specified)  No partnership data on file   Family History  Problem Relation Age of Onset   Diabetes Mother        and on mother's side of family   Colon cancer Mother    Stroke Mother    Diabetes Father        and on father's side of family   Diabetes Sister    Diabetes Sister    Diabetes Brother    Heart disease Neg Hx     Rectal cancer Neg Hx    Stomach cancer Neg Hx     ROS    Objective:     BP 139/89 (BP Location: Right Arm, Patient Position: Sitting, Cuff Size: Small)   Pulse 70   Temp 97.8 F (36.6 C) (Oral)   Resp 16   Ht 5\' 4"  (1.626 m)   Wt 122 lb (55.3 kg)   SpO2 100%   BMI 20.94 kg/m  BP Readings from Last 3 Encounters:  08/28/23 139/89  07/04/23 138/70  06/13/23 135/70   Wt Readings from Last 3 Encounters:  08/28/23 122 lb (55.3 kg)  07/04/23 123 lb (55.8 kg)  06/13/23  124 lb 9.6 oz (56.5 kg)   SpO2 Readings from Last 3 Encounters:  08/28/23 100%  07/04/23 97%  06/13/23 100%      Physical Exam   No results found for any visits on 08/28/23.  Last CBC Lab Results  Component Value Date   WBC 4.8 08/13/2023   HGB 13.0 08/13/2023   HCT 39.0 08/13/2023   MCV 96.3 08/13/2023   MCH 32.1 08/13/2023   RDW 16.1 (H) 08/13/2023   PLT 389 08/13/2023   Last metabolic panel Lab Results  Component Value Date   GLUCOSE 153 (H) 07/04/2023   NA 141 03/21/2023   K 4.4 03/21/2023   CL 109 03/21/2023   CO2 25 03/21/2023   BUN 30 (H) 03/21/2023   CREATININE 1.93 (H) 03/21/2023   GFRNONAA 26 (L) 03/21/2023   CALCIUM 9.2 03/21/2023   PROT 6.5 03/21/2023   ALBUMIN 4.0 03/21/2023   BILITOT 0.9 03/21/2023   ALKPHOS 52 03/21/2023   AST 19 03/21/2023   ALT 25 03/21/2023   ANIONGAP 7 03/21/2023   Last lipids Lab Results  Component Value Date   CHOL 166 01/06/2023   HDL 55.70 01/06/2023   LDLCALC 89 01/06/2023   TRIG 104.0 01/06/2023   CHOLHDL 3 01/06/2023   Last hemoglobin A1c Lab Results  Component Value Date   HGBA1C 9.0 (A) 02/27/2023   Last thyroid functions Lab Results  Component Value Date   TSH 0.71 07/04/2023   Last vitamin D No results found for: "25OHVITD2", "25OHVITD3", "VD25OH" Last vitamin B12 and Folate No results found for: "VITAMINB12", "FOLATE"    The ASCVD Risk score (Arnett DK, et al., 2019) failed to calculate for the following reasons:    The 2019 ASCVD risk score is only valid for ages 90 to 36   Risk score cannot be calculated because patient has a medical history suggesting prior/existing ASCVD    Assessment & Plan:   Problem List Items Addressed This Visit       Unprioritized   Uncontrolled type 2 diabetes mellitus with hyperglycemia (HCC)   Relevant Orders   CBC with Differential/Platelet   Comprehensive metabolic panel   Lipid panel   TSH   Preventative health care - Primary   Ghm utd Check labs  See AVS Health Maintenance  Topic Date Due   OPHTHALMOLOGY EXAM  09/14/2020   COVID-19 Vaccine (4 - 2024-25 season) 09/13/2023 (Originally 05/04/2023)   Zoster Vaccines- Shingrix (1 of 2) 11/26/2023 (Originally 10/02/1960)   Pneumonia Vaccine 48+ Years old (2 of 2 - PCV) 01/06/2024 (Originally 11/17/2014)   INFLUENZA VACCINE  06/12/2024 (Originally 04/03/2023)   DTaP/Tdap/Td (1 - Tdap) 08/27/2024 (Originally 10/02/1960)   Diabetic kidney evaluation - Urine ACR  07/03/2028 (Originally 02/12/2023)   HEMOGLOBIN A1C  08/29/2023   FOOT EXAM  01/06/2024   Diabetic kidney evaluation - eGFR measurement  03/20/2024   Medicare Annual Wellness (AWV)  04/20/2024   DEXA SCAN  Addressed   HPV VACCINES  Aged Out   Colonoscopy  Discontinued   Hepatitis C Screening  Discontinued   Pt refused vaccinations       Myelodysplastic syndrome, unspecified (HCC)   Pre oncology      Hypertension associated with diabetes (HCC)   Well controlled, no changes to meds. Encouraged heart healthy diet such as the DASH diet and exercise as tolerated.        Relevant Orders   CBC with Differential/Platelet   Comprehensive metabolic panel   Lipid panel   TSH  Hyperlipidemia associated with type 2 diabetes mellitus (HCC)   Encourage heart healthy diet such as MIND or DASH diet, increase exercise, avoid trans fats, simple carbohydrates and processed foods, consider a krill or fish or flaxseed oil cap daily.        Relevant Orders   CBC with  Differential/Platelet   Comprehensive metabolic panel   Lipid panel   TSH   Cerebrovascular accident (CVA) (HCC)   Stable On plavix Sees neuro      Assessment and Plan    Type 2 Diabetes Mellitus   She reports not checking blood sugars recently and admits to dietary lapses during the holiday season. We will encourage the resumption of regular blood glucose monitoring and a follow-up with Dr. Darl Householder on March 4th.  Hypertension   Her blood pressure is reported to be well-controlled but not monitored at home, while currently on amlodipine, metoprolol, and lisinopril. We will continue the current antihypertensive medications and encourage home blood pressure monitoring.  Hyperlipidemia   She is on atorvastatin and ezetimibe for lipid management. We will continue atorvastatin and ezetimibe.  Chronic Myelogenous Leukemia   She is on hydroxyurea prescribed by Dr. Tora Duck, with a follow-up with the oncologist scheduled for February 11th. We will continue hydroxyurea and ensure the follow-up with the oncologist on February 11th.  General Health Maintenance   She has not had a recent eye exam, tetanus shot, or shingles vaccine and declines mammograms and DEXA scans. Regular physical activity includes walking in the backyard and at [place]. We will encourage scheduling an eye exam, discuss tetanus and shingles vaccines at the next visit, and encourage continued physical activity.  Follow-up   We will order lab work and schedule a follow-up appointment in six months.       Return in about 6 months (around 02/26/2024) for diabetes II, hyperlipidemia, hypertension.    Donato Schultz, DO

## 2023-08-28 NOTE — Assessment & Plan Note (Signed)
Stable On plavix Sees neuro

## 2023-08-28 NOTE — Assessment & Plan Note (Signed)
Well controlled, no changes to meds. Encouraged heart healthy diet such as the DASH diet and exercise as tolerated.  °

## 2023-08-28 NOTE — Assessment & Plan Note (Signed)
Encourage heart healthy diet such as MIND or DASH diet, increase exercise, avoid trans fats, simple carbohydrates and processed foods, consider a krill or fish or flaxseed oil cap daily.  °

## 2023-08-28 NOTE — Assessment & Plan Note (Signed)
Ghm utd Check labs  See AVS Health Maintenance  Topic Date Due   OPHTHALMOLOGY EXAM  09/14/2020   COVID-19 Vaccine (4 - 2024-25 season) 09/13/2023 (Originally 05/04/2023)   Zoster Vaccines- Shingrix (1 of 2) 11/26/2023 (Originally 10/02/1960)   Pneumonia Vaccine 12+ Years old (2 of 2 - PCV) 01/06/2024 (Originally 11/17/2014)   INFLUENZA VACCINE  06/12/2024 (Originally 04/03/2023)   DTaP/Tdap/Td (1 - Tdap) 08/27/2024 (Originally 10/02/1960)   Diabetic kidney evaluation - Urine ACR  07/03/2028 (Originally 02/12/2023)   HEMOGLOBIN A1C  08/29/2023   FOOT EXAM  01/06/2024   Diabetic kidney evaluation - eGFR measurement  03/20/2024   Medicare Annual Wellness (AWV)  04/20/2024   DEXA SCAN  Addressed   HPV VACCINES  Aged Out   Colonoscopy  Discontinued   Hepatitis C Screening  Discontinued   Pt refused vaccinations

## 2023-08-28 NOTE — Assessment & Plan Note (Signed)
Pre oncology

## 2023-09-03 ENCOUNTER — Other Ambulatory Visit: Payer: Self-pay | Admitting: Family Medicine

## 2023-09-03 DIAGNOSIS — I10 Essential (primary) hypertension: Secondary | ICD-10-CM

## 2023-09-05 ENCOUNTER — Other Ambulatory Visit: Payer: Self-pay | Admitting: Family Medicine

## 2023-10-14 ENCOUNTER — Encounter: Payer: Self-pay | Admitting: *Deleted

## 2023-10-14 ENCOUNTER — Inpatient Hospital Stay: Payer: Medicare HMO

## 2023-10-14 ENCOUNTER — Inpatient Hospital Stay: Payer: Medicare HMO | Attending: Nurse Practitioner | Admitting: Oncology

## 2023-10-14 DIAGNOSIS — I10 Essential (primary) hypertension: Secondary | ICD-10-CM | POA: Insufficient documentation

## 2023-10-14 DIAGNOSIS — Z8673 Personal history of transient ischemic attack (TIA), and cerebral infarction without residual deficits: Secondary | ICD-10-CM | POA: Insufficient documentation

## 2023-10-14 DIAGNOSIS — E119 Type 2 diabetes mellitus without complications: Secondary | ICD-10-CM | POA: Insufficient documentation

## 2023-10-14 DIAGNOSIS — Z7964 Long term (current) use of myelosuppressive agent: Secondary | ICD-10-CM | POA: Insufficient documentation

## 2023-10-14 DIAGNOSIS — D45 Polycythemia vera: Secondary | ICD-10-CM | POA: Insufficient documentation

## 2023-10-14 NOTE — Progress Notes (Signed)
Brandi Mccormick was "no show" for her lab/OV today. Scheduling message sent to reschedule for 1 month.

## 2023-10-15 ENCOUNTER — Other Ambulatory Visit: Payer: Self-pay | Admitting: Family Medicine

## 2023-10-18 ENCOUNTER — Other Ambulatory Visit: Payer: Self-pay | Admitting: Family Medicine

## 2023-10-27 ENCOUNTER — Telehealth: Payer: Self-pay | Admitting: *Deleted

## 2023-10-27 NOTE — Telephone Encounter (Signed)
 Patient was identified as falling into the [place] Measure - Diabetes.   Patient was: Appointment scheduled for lab or office visit for A1c.

## 2023-10-28 ENCOUNTER — Inpatient Hospital Stay (HOSPITAL_BASED_OUTPATIENT_CLINIC_OR_DEPARTMENT_OTHER): Payer: Medicare HMO | Admitting: Oncology

## 2023-10-28 ENCOUNTER — Inpatient Hospital Stay: Payer: Medicare HMO

## 2023-10-28 VITALS — BP 101/58 | HR 80 | Temp 98.1°F | Resp 18 | Ht 64.0 in | Wt 121.1 lb

## 2023-10-28 DIAGNOSIS — D471 Chronic myeloproliferative disease: Secondary | ICD-10-CM

## 2023-10-28 DIAGNOSIS — Z7964 Long term (current) use of myelosuppressive agent: Secondary | ICD-10-CM | POA: Diagnosis not present

## 2023-10-28 DIAGNOSIS — E119 Type 2 diabetes mellitus without complications: Secondary | ICD-10-CM | POA: Diagnosis not present

## 2023-10-28 DIAGNOSIS — Z8673 Personal history of transient ischemic attack (TIA), and cerebral infarction without residual deficits: Secondary | ICD-10-CM | POA: Diagnosis not present

## 2023-10-28 DIAGNOSIS — I10 Essential (primary) hypertension: Secondary | ICD-10-CM | POA: Diagnosis not present

## 2023-10-28 DIAGNOSIS — D45 Polycythemia vera: Secondary | ICD-10-CM | POA: Diagnosis not present

## 2023-10-28 LAB — CBC WITH DIFFERENTIAL (CANCER CENTER ONLY)
Abs Immature Granulocytes: 0.03 10*3/uL (ref 0.00–0.07)
Basophils Absolute: 0 10*3/uL (ref 0.0–0.1)
Basophils Relative: 0 %
Eosinophils Absolute: 0.1 10*3/uL (ref 0.0–0.5)
Eosinophils Relative: 2 %
HCT: 36.1 % (ref 36.0–46.0)
Hemoglobin: 12.3 g/dL (ref 12.0–15.0)
Immature Granulocytes: 1 %
Lymphocytes Relative: 26 %
Lymphs Abs: 1.4 10*3/uL (ref 0.7–4.0)
MCH: 35.5 pg — ABNORMAL HIGH (ref 26.0–34.0)
MCHC: 34.1 g/dL (ref 30.0–36.0)
MCV: 104.3 fL — ABNORMAL HIGH (ref 80.0–100.0)
Monocytes Absolute: 0.5 10*3/uL (ref 0.1–1.0)
Monocytes Relative: 8 %
Neutro Abs: 3.4 10*3/uL (ref 1.7–7.7)
Neutrophils Relative %: 63 %
Platelet Count: 418 10*3/uL — ABNORMAL HIGH (ref 150–400)
RBC: 3.46 MIL/uL — ABNORMAL LOW (ref 3.87–5.11)
RDW: 16.2 % — ABNORMAL HIGH (ref 11.5–15.5)
WBC Count: 5.4 10*3/uL (ref 4.0–10.5)
nRBC: 0 % (ref 0.0–0.2)

## 2023-10-28 MED ORDER — HYDROXYUREA 500 MG PO CAPS: 500.0000 mg | ORAL_CAPSULE | Freq: Every day | ORAL | 1 refills | Status: AC

## 2023-10-28 NOTE — Progress Notes (Signed)
  [facility] OFFICE PROGRESS NOTE   Diagnosis: Polycythemia vera  INTERVAL HISTORY:   Ms. Siegfried returns as scheduled.  She is taking hydroxyurea at a dose of 500 mg daily.  No mouth sores, nausea, bleeding, or symptom of thrombosis.  No complaint.  Objective:  Vital signs in last 24 hours:  Blood pressure (!) 101/58, pulse 80, temperature 98.1 F (36.7 C), temperature source Temporal, resp. rate 18, height 5\' 4"  (1.626 m), weight 121 lb 1.6 oz (54.9 kg), SpO2 100%.    HEENT: No thrush or ulcers Resp: Lungs clear bilaterally Cardio: Regular rate and rhythm GI: No hepatosplenomegaly Vascular: No leg edema  Lab Results:  Lab Results  Component Value Date   WBC 5.4 10/28/2023   HGB 12.3 10/28/2023   HCT 36.1 10/28/2023   MCV 104.3 (H) 10/28/2023   PLT 418 (H) 10/28/2023   NEUTROABS 3.4 10/28/2023    CMP  Lab Results  Component Value Date   NA 140 08/28/2023   K 4.9 08/28/2023   CL 107 08/28/2023   CO2 25 08/28/2023   GLUCOSE 225 (H) 08/28/2023   BUN 35 (H) 08/28/2023   CREATININE 1.59 (H) 08/28/2023   CALCIUM 9.2 08/28/2023   PROT 6.3 08/28/2023   ALBUMIN 3.9 08/28/2023   AST 25 08/28/2023   ALT 35 08/28/2023   ALKPHOS 67 08/28/2023   BILITOT 0.6 08/28/2023   GFRNONAA 26 (L) 03/21/2023   GFRAA 24 (L) 06/10/2019    No results found for: "CEA1", "CEA", "ZOX096", "CA125"  Lab Results  Component Value Date   INR 1.0 10/09/2020   LABPROT 12.8 10/09/2020    Imaging:  No results found.  Medications: I have reviewed the patient's current medications.   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 Holtmeyer appears stable.  The platelet count and hemoglobin are in goal range.  She will continue hydroxyurea at the current dose.  She will return for a lab visit in 3 months and an office visit in 6 months.  She declines an influenza vaccine.  I recommended she obtain a pneumonia vaccine.  Thornton Papas, MD  10/28/2023  10:15 AM

## 2023-10-29 ENCOUNTER — Encounter: Payer: Self-pay | Admitting: Podiatry

## 2023-10-29 ENCOUNTER — Ambulatory Visit: Payer: Medicare HMO | Admitting: Podiatry

## 2023-10-29 VITALS — Ht 64.0 in | Wt 121.0 lb

## 2023-10-29 DIAGNOSIS — M79674 Pain in right toe(s): Secondary | ICD-10-CM | POA: Diagnosis not present

## 2023-10-29 DIAGNOSIS — E119 Type 2 diabetes mellitus without complications: Secondary | ICD-10-CM | POA: Diagnosis not present

## 2023-10-29 DIAGNOSIS — M79675 Pain in left toe(s): Secondary | ICD-10-CM | POA: Diagnosis not present

## 2023-10-29 DIAGNOSIS — B351 Tinea unguium: Secondary | ICD-10-CM | POA: Diagnosis not present

## 2023-10-31 NOTE — Progress Notes (Signed)
 Subjective:  Patient ID: Brandi Mccormick, female    DOB: Nov 15, 1940,  MRN: [id number]  Brandi Mccormick presents to clinic today for preventative diabetic foot care and painful, elongated thickened toenails x 10 which are symptomatic when wearing enclosed shoe gear. This interferes with his/her daily activities.  Chief Complaint  Patient presents with   [facility]    She is here for diabetic nail trim, does not remember last A1C level, PCP is dr. Laury Axon and last seen 3-4 months ago.    New problem(s): None.   PCP is Zola Button, [place] R, DO.  Allergies  Allergen Reactions   Penicillins Nausea And Vomiting    Has patient had a PCN reaction causing immediate rash, facial/tongue/throat swelling, SOB or lightheadedness with hypotension: no Has patient had a PCN reaction causing severe rash involving mucus membranes or skin necrosis: no Has patient had a PCN reaction that required hospitalization: no Has patient had a PCN reaction occurring within the last 10 years: no If all of the above answers are "NO", then may proceed with Cephalosporin use.     Review of Systems: Negative except as noted in the HPI.  Objective: No changes noted in today's physical examination. There were no vitals filed for this visit. Brandi Mccormick is a pleasant 83 y.o. female WD, WN in NAD. AAO x 3.  Vascular Examination: CFT <3 seconds b/l. DP/PT pulses faintly palpable b/l. Skin temperature gradient warm to warm b/l. No pain with calf compression. No ischemia or gangrene. No cyanosis or clubbing noted b/l. No edema noted b/l LE.   Neurological Examination: Sensation grossly intact b/l with 10 gram monofilament. Vibratory sensation intact b/l.   Dermatological Examination: Pedal skin warm and supple b/l.   No open wounds. No interdigital macerations.  Toenails 1-5 b/l thick, discolored, elongated with subungual debris and pain on dorsal palpation.    No hyperkeratotic nor porokeratotic lesions present on  today's visit.  Musculoskeletal Examination: Muscle strength 5/5 to all lower extremity muscle groups bilaterally. Hammertoe deformity noted 2-5 b/l. Pes planus deformity noted bilateral LE.  Radiographs: None  Last A1c:      Latest Ref Rng & Units 02/27/2023    4:04 PM  Hemoglobin A1C  Hemoglobin-A1c 4.0 - 5.6 % 9.0    Assessment/Plan: 1. Pain due to onychomycosis of toenails of both feet   2. Controlled type 2 diabetes mellitus without complication, without long-term current use of insulin (HCC)    Patient was evaluated and treated. All patient's and/or POA's questions/concerns addressed on today's visit. Mycotic toenails 1-5 debrided in length and girth without incident.  Continue daily foot inspections and monitor blood glucose per PCP/Endocrinologist's recommendations.Continue soft, supportive shoe gear daily. Report any pedal injuries to medical professional. Call office if there are any quesitons/concerns. -Patient/POA to call should there be question/concern in the interim.   Return in about 4 months (around 02/26/2024).  Brandi Mccormick, DPM      [place] LOCATION: [address], [place] [address]                   Office [phone]   [facility] LOCATION: [address] [place], [place] [address] Office [phone]

## 2023-11-04 ENCOUNTER — Ambulatory Visit: Payer: Medicare HMO | Admitting: Internal Medicine

## 2023-11-04 ENCOUNTER — Encounter: Payer: Self-pay | Admitting: Internal Medicine

## 2023-11-04 VITALS — BP 112/62 | HR 65 | Ht 64.0 in | Wt 120.8 lb

## 2023-11-04 DIAGNOSIS — E1169 Type 2 diabetes mellitus with other specified complication: Secondary | ICD-10-CM

## 2023-11-04 DIAGNOSIS — E785 Hyperlipidemia, unspecified: Secondary | ICD-10-CM | POA: Diagnosis not present

## 2023-11-04 DIAGNOSIS — Z8639 Personal history of other endocrine, nutritional and metabolic disease: Secondary | ICD-10-CM | POA: Diagnosis not present

## 2023-11-04 DIAGNOSIS — Z794 Long term (current) use of insulin: Secondary | ICD-10-CM

## 2023-11-04 DIAGNOSIS — E1165 Type 2 diabetes mellitus with hyperglycemia: Secondary | ICD-10-CM

## 2023-11-04 LAB — GLUCOSE, POCT (MANUAL RESULT ENTRY): POC Glucose: 302 mg/dL — AB (ref 70–99)

## 2023-11-04 LAB — POCT GLYCOSYLATED HEMOGLOBIN (HGB A1C): Hemoglobin A1C: 11.2 % — AB (ref 4.0–5.6)

## 2023-11-04 MED ORDER — INSULIN PEN NEEDLE 32G X 4 MM MISC: 3 refills | Status: DC

## 2023-11-04 MED ORDER — LANTUS SOLOSTAR 100 UNIT/ML ~~LOC~~ SOPN: 8.0000 [IU] | PEN_INJECTOR | Freq: Every day | SUBCUTANEOUS | 99 refills | Status: DC

## 2023-11-04 NOTE — Progress Notes (Signed)
 Patient ID: Brandi Mccormick, female   DOB: February 21, 1941, 83 y.o.   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 Last visit 4 mo ago.  Interim history: No increased urination, nausea, chest pain.+ some blurry vision. She did not change her meter as advised at last visit.  She has a new meter with her but did not start it.  Also, she did not start insulin as advised at the last 2 visits!  Glucose in the office is 302 today.  DM2: Reviewed HbA1c levels: 07/04/2023: HbA1c 9.3% Lab Results  Component Value Date   HGBA1C 9.0 (A) 02/27/2023   HGBA1C 8.1 (A) 10/28/2022   HGBA1C 8.4 (A) 06/27/2022   HGBA1C 7.2 (H) 02/11/2022   HGBA1C 7.1 (A) 12/25/2021   HGBA1C 7.9 (A) 07/11/2021   HGBA1C 6.9 (A) 03/07/2021   HGBA1C 8.0 (H) 10/07/2020   HGBA1C 7.3 (A) 06/29/2020   HGBA1C 7.2 (A) 02/24/2020   HGBA1C 7.2 (H) 11/04/2019   HGBA1C 6.6 (H) 05/06/2019   HGBA1C 6.1 12/21/2018   HGBA1C 8.3 (H) 09/11/2018   HGBA1C 6.9 (A) 06/04/2018  07/11/2021: HbA1c calculated from fructosamine is 9.5%, even higher than the directly measured HbA1c. 11/02/2020: HbA1c calculated from fructosamine: 10%, even higher than the directly measured HbA1c.  Pt is on a regimen of: - Glipizide XL 5 mg in a.m.  >> 10 (2x 5 mg) mg in a.m.  - Lantus 8 units at bedtime - was rec'd 02/2023 and 08/2023  She was previously on Janumet XR and Metformin ER, but this was stopped 05/2019 due to declining kidney function. Tradjenta was too expensive.  Pt is not checking blood sugars now (did not start to use the meter).  From before: - am:  131-156 >> 90, 99-141, 144 >> 100-140s >> 84, 111-130 - 2h after b'fast: 124 >> 151 >> 140, 152 >> n/c >> 120, 121 - before lunch: 123, 137 >> n/c >> 133, 135 >> 140 >> n/c >> 124 - 2h after lunch: 113-147 >> 137 >> 140-153 >> 110 >>  n/c >> 130 - before dinner: 109-125 >> 87-136, 140 >> 90s-130s >> 116, 133 - 2h after dinner: 109-130 >> 145, 150 >> 136-144 >> same - bedtime:  99 >> 108, 138 >> 78-133 >> see above - nighttime: n/c >> 161 >> n/c Lowest sugar: 85 >> 81 >> 78 >> 90s >> 84 >> ?; it is unclear at which level she has hypoglycemia awareness. Highest sugar: 309 ([facility]; since then 151) ... >> 200s >> 148 >> ?.  Meter: OneTouch Verio.  Pt's meals are: - Breakfast: egg, coffee, juice - Lunch: No Lunch - Dinner: meat + vegetables >> late dinner during the weekend as her son is cooking and brings her food - Snacks: 1-2  -+ CKD (FSGS-sees nephrology), last BUN/creatinine:  Lab Results  Component Value Date   BUN 35 (H) 08/28/2023   CREATININE 1.59 (H) 08/28/2023   Lab Results  Component Value Date   GFRAA 24 (L) 06/10/2019   GFRAA 27 (L) 04/28/2019   GFRAA 43 (L) 07/27/2018   GFRAA >60 06/26/2018   GFRAA 38 (L) 06/02/2018   GFRAA 82 06/28/2016   GFRAA >90 06/25/2014   GFRAA >90 06/24/2014   GFRAA >90 10/23/2013  12/21/2018: GFR 28.9   She has MAU: Lab Results  Component Value Date   MICRALBCREAT 6.3 02/11/2022  MICRALBCREAT 43.8 (H) 11/04/2019   MICRALBCREAT 77.6 (H) 05/06/2019   MICRALBCREAT 422.7 (H) 04/02/2018   MICRALBCREAT 225.8 (H) 11/15/2016   MICRALBCREAT 48.8 (H) 06/28/2016   MICRALBCREAT 9.9 02/21/2014   MICRALBCREAT 8.8 11/16/2013  On lisinopril 40.  -+ CKD; last set of lipids: Lab Results  Component Value Date   CHOL 157 08/28/2023   HDL 56.10 08/28/2023   LDLCALC 87 08/28/2023   TRIG 72.0 08/28/2023   CHOLHDL 3 08/28/2023  Prev. on Simvastatin >> off in 06/2016 b/c fatigue >> pravastatin 80 mg daily in 12/2016 >> Crestor 40 >> Lipitor 80. Zetia was added.  - no numbness and tingling in her feet.  + Onychomycosis.  Latest foot exam: 10/29/2023 by Dr. Eloy Mccormick.  - Last eye exam in 09/2019: No DR reportedly; Had OS cataract sx.   H/o hyperthyroidism - previously treated  with medication, more than 20 years ago.  No other history available.  Pt denies: - feeling nodules in neck - hoarseness - dysphagia - choking  Her TFTs were normal: Lab Results  Component Value Date   TSH 0.73 08/28/2023   TSH 0.71 07/04/2023   TSH 0.58 10/28/2022   TSH 0.72 07/11/2021   TSH 0.37 11/02/2020   TSH 1.12 05/12/2019   TSH 0.635 06/26/2018   TSH 1.11 04/02/2018   TSH 1.13 07/03/2017   TSH 1.12 06/28/2016   FREET4 1.04 07/11/2021   FREET4 0.85 05/12/2019   FREET4 0.93 07/03/2017   FREET4 0.96 06/28/2016   FREET4 0.91 08/15/2014   FREET4 1.00 02/17/2014   FREET4 0.97 12/20/2013   T3FREE 2.6 07/11/2021   T3FREE 2.7 05/12/2019   T3FREE 3.2 07/03/2017   T3FREE 3.1 06/28/2016   T3FREE 3.2 08/15/2014   T3FREE 2.6 02/17/2014   T3FREE 2.9 12/20/2013   She had a CVA on 10/06/2020 >> had  PT. She also has a history of gout. She has chronic myeloproliferative ds.  She is on hydroxyurea. 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  ROS: + see HPI  I reviewed pt's medications, allergies, PMH, social hx, family hx, and changes were documented in the history of present illness. Otherwise, unchanged from my initial visit note.  Past Medical History:  Diagnosis Date   CVA (cerebral vascular accident) (HCC) 10/2020   Diabetes (HCC)    GERD (gastroesophageal reflux disease)    Hyperlipidemia    Hypertension    Hyperthyroidism    Pneumonia    Polycythemia    Shingles    Past Surgical History:  Procedure Laterality Date   CATARACT EXTRACTION Left 2020   COLONOSCOPY     DENTAL SURGERY     IR ANGIO INTRA EXTRACRAN SEL COM CAROTID INNOMINATE BILAT MOD SED  10/09/2020   IR ANGIO VERTEBRAL SEL VERTEBRAL BILAT MOD SED  10/09/2020   Social History   Socioeconomic History   Marital status: Married    Spouse name: Not on file   Number of children: Not on file   Years of education: Not on file    Highest education level: Not on file  Occupational History   Occupation: [employer]--    Comment: retired  Tobacco Use   Smoking status: Never   Smokeless tobacco: Never  Vaping Use   Vaping status: Never Used  Substance and Sexual Activity   Alcohol use: Yes    Alcohol/week: 0.0 standard drinks of alcohol    Comment: occasional wine cooler  Drug use: No   Sexual activity: Not Currently    Partners: Male  Other Topics Concern   Not on file  Social History Narrative   Exercise-- yard work,  Walking in [employer]   03/15/21 lives with husband   Social Drivers of Corporate investment banker Strain: Low Risk  (04/21/2023)   Overall Financial Resource Strain (CARDIA)    Difficulty of Paying Living Expenses: Not hard at all  Food Insecurity: No Food Insecurity (04/21/2023)   Hunger Vital Sign    Worried About Running Out of Food in the Last Year: Never true    Ran Out of Food in the Last Year: Never true  Transportation Needs: No Transportation Needs (04/21/2023)   PRAPARE - Administrator, Civil Service (Medical): No    Lack of Transportation (Non-Medical): No  Physical Activity: Inactive (04/21/2023)   Exercise Vital Sign    Days of Exercise per Week: 0 days    Minutes of Exercise per Session: 0 min  Stress: No Stress Concern Present (04/21/2023)   [employer] of Occupational Health - Occupational Stress Questionnaire    Feeling of Stress : Not at all  Social Connections: Moderately Isolated (04/21/2023)   Social Connection and Isolation Panel [NHANES]    Frequency of Communication with Friends and Family: More than three times a week    Frequency of Social Gatherings with Friends and Family: Three times a week    Attends Religious Services: Never    Active Member of Clubs or Organizations: No    Attends Banker Meetings: Never    Marital Status: Married  Catering manager Violence: Not At Risk (04/21/2023)   Humiliation, Afraid, Rape, and Kick  questionnaire    Fear of Current or Ex-Partner: No    Emotionally Abused: No    Physically Abused: No    Sexually Abused: No   Current Outpatient Medications on File Prior to Visit  Medication Sig Dispense Refill   amLODipine (NORVASC) 10 MG tablet Take 1 tablet (10 mg total) by mouth daily. 90 tablet 0   atorvastatin (LIPITOR) 80 MG tablet Take 1 tablet by mouth once daily 90 tablet 1   Blood Glucose Monitoring Suppl (TRUE METRIX METER) w/Device KIT Use as advised 1 kit 0   clopidogrel (PLAVIX) 75 MG tablet TAKE 1 TABLET BY MOUTH ONCE DAILY . APPOINTMENT REQUIRED FOR FUTURE REFILLS 15 tablet 0   ezetimibe (ZETIA) 10 MG tablet Take 1 tablet (10 mg total) by mouth daily. 90 tablet 1   glipiZIDE (GLUCOTROL XL) 5 MG 24 hr tablet Take 2 tablets by mouth once daily 180 tablet 0   glucose blood (TRUE METRIX BLOOD GLUCOSE TEST) test strip Use as instructed 2x a day 200 each 3   hydroxyurea (HYDREA) 500 MG capsule Take 1 capsule (500 mg total) by mouth daily. May take with food to minimize GI side effects. 90 capsule 1   lisinopril (ZESTRIL) 40 MG tablet Take 1 tablet (40 mg total) by mouth daily. 90 tablet 1   metoprolol succinate (TOPROL-XL) 25 MG 24 hr tablet TAKE 1 TABLET BY MOUTH ONCE DAILY WITH  OR  IMMEDIATELY  FOLLOWING  A  MEAL 90 tablet 0   pantoprazole (PROTONIX) 40 MG tablet Take 1 tablet by mouth once daily 90 tablet 0   polyethylene glycol (MIRALAX) 17 g packet Take 17 g by mouth daily as needed. 14 each 0   No current facility-administered medications on file prior to visit.  Allergies  Allergen Reactions   Penicillins Nausea And Vomiting    Has patient had a PCN reaction causing immediate rash, facial/tongue/throat swelling, SOB or lightheadedness with hypotension: no Has patient had a PCN reaction causing severe rash involving mucus membranes or skin necrosis: no Has patient had a PCN reaction that required hospitalization: no Has patient had a PCN reaction occurring within the  last 10 years: no If all of the above answers are "NO", then may proceed with Cephalosporin use.    Family History  Problem Relation Age of Onset   Diabetes Mother        and on mother's side of family   Colon cancer Mother    Stroke Mother    Diabetes Father        and on father's side of family   Diabetes Sister    Diabetes Sister    Diabetes Brother    Heart disease Neg Hx    Rectal cancer Neg Hx    Stomach cancer Neg Hx    PE: BP 112/62   Pulse 65   Ht 5\' 4"  (1.626 m)   Wt 120 lb 12.8 oz (54.8 kg)   SpO2 95%   BMI 20.74 kg/m   Wt Readings from Last 3 Encounters:  11/04/23 120 lb 12.8 oz (54.8 kg)  10/29/23 121 lb (54.9 kg)  10/28/23 121 lb 1.6 oz (54.9 kg)   Constitutional:  Normal weight, in NAD Eyes: PERRLA, EOMI, no exophthalmos ENT: no thyromegaly, no cervical lymphadenopathy Cardiovascular: RRR, No MRG Respiratory: CTA B Musculoskeletal: no deformities Skin:  no rashes Neurological: + tremor with outstretched hands  ASSESSMENT: 1. 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 H/o Hyperthyroidism - >20 years ago, resolved since - was on meds, cannot remember name  3. HL  PLAN:  1. Patient with history of uncontrolled type 2 diabetes, on sulfonylurea to which I suggested addition of long-acting insulin at the last 2 visits.  At last visit, she was not on insulin and HbA1c was even higher, at 9.3%.  She mentioned that she did not start insulin as she did not think this was absolutely necessary.  We did discuss that it is indeed necessary.  I also recommended to start checking blood sugars as she was not doing so at last visit.  We did  discuss that unfortunately I do not feel that other regimens are safe for her.  I called in a prescription for the True Metrix meter + test trips to her pharmacy.  We again discussed about stopping juice, sweet tea, regular sodas or any other sweet drinks. -At last visit we checked her insulin production and this was not low.  Also, her antipancreatic antibodies were undetectable.  However, based on the blood sugar profile, I still felt that she required insulin -At today's visit, she still did not start insulin.  She is not sure why.  We have been trying to start her on insulin for the last 3 visits.  Moreover, she is not checking blood sugars despite the fact that she has a meter.  She brings this with her.  At today's visit we showed her how to use it. -At this point, with her blood sugars continuing to increase (reflected in the very high HbA1c today), and without her following the recommended regimen, I do not feel that I can help her further.  We again discussed about the absolute need to start Lantus, reflected in the extremely high HbA1c today, which could have been prevented if she started insulin sooner.  However, I did advise her to try to establish care with another endocrinologist. -I advised her to: Patient Instructions  Please continue: - Glipizide XL 10 mg before breakfast  Start: - Lantus 8 units at bedtime  Please schedule a new eye exam.   Start checking blood sugars.  Please establish care with another endocrinologist.  - we checked her HbA1c: 11.2% (much higher)  - advised to check sugars at different times of the day - 1-2x a day, rotating check times - advised for yearly eye exams >> she is not UTD  2. H/o hyperthyroidism -No thyrotoxic signs or symptoms -TSH remains normal: Lab Results  Component Value Date   TSH 0.73 08/28/2023   3. HL -Latest lipid panel was reviewed from 08/2023: LDL above our target of less than 55 due to cardiovascular disease, otherwise  fractions at goal: Lab Results  Component Value Date   CHOL 157 08/28/2023   HDL 56.10 08/28/2023   LDLCALC 87 08/28/2023   TRIG 72.0 08/28/2023   CHOLHDL 3 08/28/2023  -She continues Lipitor 80 mg daily and Zetia 10 mg daily without side effects  Carlus Pavlov, MD PhD [facility] Endocrinology

## 2023-11-04 NOTE — Patient Instructions (Addendum)
 Please continue: - Glipizide XL 10 mg before breakfast  Start: - Lantus 8 units at bedtime  Please schedule a new eye exam.   Start checking blood sugars.  Please establish care with another endocrinologist.

## 2023-11-26 ENCOUNTER — Other Ambulatory Visit: Payer: Self-pay | Admitting: Family Medicine

## 2024-01-03 ENCOUNTER — Other Ambulatory Visit: Payer: Self-pay | Admitting: Family Medicine

## 2024-01-27 ENCOUNTER — Ambulatory Visit: Payer: Self-pay | Admitting: Oncology

## 2024-01-27 ENCOUNTER — Inpatient Hospital Stay: Payer: Medicare HMO | Attending: Oncology

## 2024-01-27 DIAGNOSIS — D471 Chronic myeloproliferative disease: Secondary | ICD-10-CM

## 2024-01-27 DIAGNOSIS — D45 Polycythemia vera: Secondary | ICD-10-CM | POA: Diagnosis not present

## 2024-01-27 LAB — CMP (CANCER CENTER ONLY)
ALT: 39 U/L (ref 0–44)
AST: 21 U/L (ref 15–41)
Albumin: 4.1 g/dL (ref 3.5–5.0)
Alkaline Phosphatase: 72 U/L (ref 38–126)
Anion gap: 12 (ref 5–15)
BUN: 23 mg/dL (ref 8–23)
CO2: 23 mmol/L (ref 22–32)
Calcium: 10.5 mg/dL — ABNORMAL HIGH (ref 8.9–10.3)
Chloride: 100 mmol/L (ref 98–111)
Creatinine: 1.67 mg/dL — ABNORMAL HIGH (ref 0.44–1.00)
GFR, Estimated: 30 mL/min — ABNORMAL LOW (ref >60)
Glucose, Bld: 465 mg/dL — ABNORMAL HIGH (ref 70–99)
Potassium: 4.7 mmol/L (ref 3.5–5.1)
Sodium: 136 mmol/L (ref 135–145)
Total Bilirubin: 0.8 mg/dL (ref 0.0–1.2)
Total Protein: 6.9 g/dL (ref 6.5–8.1)

## 2024-01-27 LAB — CBC WITH DIFFERENTIAL (CANCER CENTER ONLY)
Abs Immature Granulocytes: 0.01 10*3/uL (ref 0.00–0.07)
Basophils Absolute: 0 10*3/uL (ref 0.0–0.1)
Basophils Relative: 0 %
Eosinophils Absolute: 0 10*3/uL (ref 0.0–0.5)
Eosinophils Relative: 2 %
HCT: 35.7 % — ABNORMAL LOW (ref 36.0–46.0)
Hemoglobin: 12.6 g/dL (ref 12.0–15.0)
Immature Granulocytes: 0 %
Lymphocytes Relative: 33 %
Lymphs Abs: 0.9 10*3/uL (ref 0.7–4.0)
MCH: 39.1 pg — ABNORMAL HIGH (ref 26.0–34.0)
MCHC: 35.3 g/dL (ref 30.0–36.0)
MCV: 110.9 fL — ABNORMAL HIGH (ref 80.0–100.0)
Monocytes Absolute: 0.2 10*3/uL (ref 0.1–1.0)
Monocytes Relative: 7 %
Neutro Abs: 1.6 10*3/uL — ABNORMAL LOW (ref 1.7–7.7)
Neutrophils Relative %: 58 %
Platelet Count: 212 10*3/uL (ref 150–400)
RBC: 3.22 MIL/uL — ABNORMAL LOW (ref 3.87–5.11)
RDW: 13.2 % (ref 11.5–15.5)
WBC Count: 2.7 10*3/uL — ABNORMAL LOW (ref 4.0–10.5)
nRBC: 0 % (ref 0.0–0.2)

## 2024-01-28 ENCOUNTER — Other Ambulatory Visit: Payer: Self-pay

## 2024-01-28 DIAGNOSIS — D649 Anemia, unspecified: Secondary | ICD-10-CM

## 2024-01-28 NOTE — Telephone Encounter (Signed)
 Patient gave verbal understanding and had no further questions at this time.

## 2024-01-28 NOTE — Telephone Encounter (Signed)
-----   Message from Coni Deep sent at 01/27/2024  6:01 PM EDT ----- Please call patient, the hemoglobin and platelet count remain in goal range, the white count is mildly low, call for any fever or symptoms of an infection, continue hydroxyurea  at the current dose, repeat CBC in 3-4 weeks The blood sugar was markedly elevated, be sure she is taking her diabetes medication and checking her blood sugar Check BMP at next lab

## 2024-02-01 ENCOUNTER — Other Ambulatory Visit: Payer: Self-pay | Admitting: Family Medicine

## 2024-02-02 ENCOUNTER — Ambulatory Visit (INDEPENDENT_AMBULATORY_CARE_PROVIDER_SITE_OTHER): Admitting: Physician Assistant

## 2024-02-02 ENCOUNTER — Ambulatory Visit: Payer: Self-pay

## 2024-02-02 ENCOUNTER — Encounter: Payer: Self-pay | Admitting: Physician Assistant

## 2024-02-02 VITALS — BP 102/58 | HR 82 | Ht 64.0 in

## 2024-02-02 DIAGNOSIS — Z7984 Long term (current) use of oral hypoglycemic drugs: Secondary | ICD-10-CM | POA: Diagnosis not present

## 2024-02-02 DIAGNOSIS — E1165 Type 2 diabetes mellitus with hyperglycemia: Secondary | ICD-10-CM | POA: Diagnosis not present

## 2024-02-02 DIAGNOSIS — I959 Hypotension, unspecified: Secondary | ICD-10-CM | POA: Diagnosis not present

## 2024-02-02 LAB — GLUCOSE, POCT (MANUAL RESULT ENTRY): POC Glucose: 210 mg/dL — AB (ref 70–99)

## 2024-02-02 MED ORDER — GLIPIZIDE ER 5 MG PO TB24: 10.0000 mg | ORAL_TABLET | Freq: Every day | ORAL | 0 refills | Status: DC

## 2024-02-02 NOTE — Telephone Encounter (Signed)
  Chief Complaint: high blood sugar Symptoms: weakness when walking   Disposition: [] ED /[] Urgent Care (no appt availability in office) / [x] Appointment(In office/virtual)/ []  [place] [facility]/ [] Home Care/ [] Refused Recommended Disposition /[] [place] [facility]/ []  Follow-up with PCP Additional Notes: Pt calling with concerns of high blood sugar. Pt is out of medication Glipizide  for several weeks. Pt's blood sugar last night was 214. Current sugar is 199. PT feels weak when walking but denies all other symptoms. Pt is no longer under the care of provider to prescribed Glipizide  and hasn't had any medication to regulate sugar. Pt has appt this morning at 10 wit PA Drubel. RN gave care advice and pt verbalized understanding.              Copied from CRM [id number]. Topic: Clinical - Red Word Triage >> Feb 02, 2024  8:55 AM Elle L wrote: Red Word that prompted transfer to Nurse Triage: The patient states she is diabetic and she was dismissed from the specialist and has run out of her medication. She advised that she is having trouble walking due to weakness from her blood sugar. The patient's blood sugar was 214 when she checked it last night. Reason for Disposition  [1] Symptoms of high blood sugar (e.g., abnormally thirsty, frequent urination, weight loss) AND [2] not able to test blood glucose  Answer Assessment - Initial Assessment Questions 1. BLOOD GLUCOSE: "What is your blood glucose level?"      199 2. ONSET: "When did you check the blood glucose?"     Now  3. USUAL RANGE: "What is your glucose level usually?" (e.g., usual fasting morning value, usual evening value)     Upper 100s 4. KETONES: "Do you check for ketones (urine or blood test strips)?" If Yes, ask: "What does the test show now?"      Na  5. TYPE 1 or 2:  "Do you know what type of diabetes you have?"  (e.g., Type 1, Type 2, Gestational; doesn't know)      Type 2  6. INSULIN : "Do you take insulin ?" "What  type of insulin (s) do you use? What is the mode of delivery? (syringe, pen; injection or pump)?"      Na  7. DIABETES PILLS: "Do you take any pills for your diabetes?" If Yes, ask: "Have you missed taking any pills recently?"     Glipizide   8. OTHER SYMPTOMS: "Do you have any symptoms?" (e.g., fever, frequent urination, difficulty breathing, dizziness, weakness, vomiting)     Weakness when walking  Protocols used: Diabetes - High Blood Sugar-A-AH

## 2024-02-02 NOTE — Progress Notes (Signed)
 Established patient visit   Patient: Brandi Mccormick   DOB: Jan 27, 1941   83 y.o. Female  MRN: [id number] Visit Date: 02/02/2024  Today's healthcare provider: Trenton Frock, PA-C   Cc. Diabetes follow up  Subjective     Discussed the use of AI scribe software for clinical note transcription with the patient, who gave verbal consent to proceed.  History of Present Illness   Brandi Mccormick is an 83 year old female with diabetes who presents with high blood sugar and medication management issues.  Pt reports she is out of her glipizide . She has not picked up or started insulin . Reports her endocrinologist has discharged her. She is not interested in starting insulin .   She reports she knows what raises her blood sugar, she eats meals she is 'not supposed to' ie bread pudding.   She denies dizziness, fatigue, chest pain today.  Medications: Outpatient Medications Prior to Visit  Medication Sig   amLODipine  (NORVASC ) 10 MG tablet Take 1 tablet by mouth once daily   atorvastatin  (LIPITOR) 80 MG tablet Take 1 tablet by mouth once daily   Blood Glucose Monitoring Suppl (TRUE METRIX METER) w/Device KIT Use as advised   clopidogrel  (PLAVIX ) 75 MG tablet TAKE 1 TABLET BY MOUTH ONCE DAILY . APPOINTMENT REQUIRED FOR FUTURE REFILLS   ezetimibe  (ZETIA ) 10 MG tablet Take 1 tablet (10 mg total) by mouth daily.   glucose blood (TRUE METRIX BLOOD GLUCOSE TEST) test strip Use as instructed 2x a day (Patient not taking: Reported on 02/02/2024)   hydroxyurea  (HYDREA ) 500 MG capsule Take 1 capsule (500 mg total) by mouth daily. May take with food to minimize GI side effects.   insulin  glargine (LANTUS  SOLOSTAR) 100 UNIT/ML Solostar Pen Inject 8 Units into the skin at bedtime. (Patient not taking: Reported on 02/02/2024)   Insulin  Pen Needle 32G X 4 MM MISC Use 1x a day (Patient not taking: Reported on 02/02/2024)   lisinopril  (ZESTRIL ) 40 MG tablet Take 1 tablet (40 mg total) by mouth daily.    metoprolol  succinate (TOPROL -XL) 25 MG 24 hr tablet TAKE 1 TABLET BY MOUTH ONCE DAILY WITH MEAL   pantoprazole  (PROTONIX ) 40 MG tablet Take 1 tablet (40 mg total) by mouth daily.   polyethylene glycol (MIRALAX ) 17 g packet Take 17 g by mouth daily as needed.   [DISCONTINUED] glipiZIDE  (GLUCOTROL  XL) 5 MG 24 hr tablet Take 2 tablets by mouth once daily (Patient not taking: Reported on 02/02/2024)   No facility-administered medications prior to visit.    Review of Systems  Constitutional:  Negative for fatigue and fever.  Respiratory:  Negative for cough and shortness of breath.   Cardiovascular:  Negative for chest pain and leg swelling.  Gastrointestinal:  Negative for abdominal pain.  Neurological:  Negative for dizziness and headaches.       Objective    BP (!) 102/58   Pulse 82   Ht 5\' 4"  (1.626 m)   BMI 20.74 kg/m    Physical Exam Constitutional:      General: She is awake.     Appearance: She is well-developed.  HENT:     Head: Normocephalic.  Eyes:     Conjunctiva/sclera: Conjunctivae normal.  Cardiovascular:     Rate and Rhythm: Normal rate and regular rhythm.     Heart sounds: Normal heart sounds.  Pulmonary:     Effort: Pulmonary effort is normal.     Breath sounds: Normal breath sounds.  Skin:  General: Skin is warm.  Neurological:     Mental Status: She is alert and oriented to person, place, and time.  Psychiatric:        Attention and Perception: Attention normal.        Mood and Affect: Mood normal.        Speech: Speech normal.        Behavior: Behavior is cooperative.     Results for orders placed or performed in visit on 02/02/24  POCT Glucose (CBG)  Result Value Ref Range   POC Glucose 210 (A) 70 - 99 mg/dl    Assessment & Plan    Uncontrolled type 2 diabetes mellitus with hyperglycemia (HCC) Assessment & Plan: Refilled glipizide , stressed given her uncontrolled DM, this will not be enough to lower her blood sugar. Stressed the importance  of glucose control, consequences of untreated DM.  Endo was managing, pt reports she has been discharged ( due to noncompliance)  Recommending she start insulin  as prescribed. F/b with pcp next week  Orders: -     POCT glucose (manual entry) -     glipiZIDE  ER; Take 2 tablets (10 mg total) by mouth daily.  Dispense: 180 tablet; Refill: 0  Hypotension, unspecified hypotension type  Borderline hypotension in office, asymptomatic. Stressed hydration, pt will f/b 1 week with pcp for possible med adjustment      Trenton Frock, PA-C  [facility] at [facility] [phone] (phone) [phone] (fax)  [facility]

## 2024-02-03 ENCOUNTER — Encounter: Payer: Self-pay | Admitting: Physician Assistant

## 2024-02-03 NOTE — Assessment & Plan Note (Signed)
 Refilled glipizide , stressed given her uncontrolled DM, this will not be enough to lower her blood sugar. Stressed the importance of glucose control, consequences of untreated DM.  Endo was managing, pt reports she has been discharged ( due to noncompliance)  Recommending she start insulin  as prescribed. F/b with pcp next week

## 2024-02-10 ENCOUNTER — Encounter: Payer: Self-pay | Admitting: Family Medicine

## 2024-02-10 ENCOUNTER — Telehealth: Payer: Self-pay

## 2024-02-10 ENCOUNTER — Ambulatory Visit: Payer: Self-pay | Admitting: Family Medicine

## 2024-02-10 ENCOUNTER — Ambulatory Visit (INDEPENDENT_AMBULATORY_CARE_PROVIDER_SITE_OTHER): Admitting: Family Medicine

## 2024-02-10 VITALS — BP 106/50 | HR 67 | Temp 97.5°F | Wt 111.2 lb

## 2024-02-10 DIAGNOSIS — E1159 Type 2 diabetes mellitus with other circulatory complications: Secondary | ICD-10-CM

## 2024-02-10 DIAGNOSIS — E1165 Type 2 diabetes mellitus with hyperglycemia: Secondary | ICD-10-CM

## 2024-02-10 DIAGNOSIS — E1169 Type 2 diabetes mellitus with other specified complication: Secondary | ICD-10-CM

## 2024-02-10 DIAGNOSIS — E785 Hyperlipidemia, unspecified: Secondary | ICD-10-CM | POA: Diagnosis not present

## 2024-02-10 DIAGNOSIS — R55 Syncope and collapse: Secondary | ICD-10-CM

## 2024-02-10 DIAGNOSIS — R3129 Other microscopic hematuria: Secondary | ICD-10-CM

## 2024-02-10 DIAGNOSIS — R42 Dizziness and giddiness: Secondary | ICD-10-CM

## 2024-02-10 DIAGNOSIS — D469 Myelodysplastic syndrome, unspecified: Secondary | ICD-10-CM

## 2024-02-10 DIAGNOSIS — I959 Hypotension, unspecified: Secondary | ICD-10-CM

## 2024-02-10 DIAGNOSIS — R197 Diarrhea, unspecified: Secondary | ICD-10-CM

## 2024-02-10 DIAGNOSIS — R195 Other fecal abnormalities: Secondary | ICD-10-CM | POA: Diagnosis not present

## 2024-02-10 DIAGNOSIS — I639 Cerebral infarction, unspecified: Secondary | ICD-10-CM

## 2024-02-10 DIAGNOSIS — I152 Hypertension secondary to endocrine disorders: Secondary | ICD-10-CM

## 2024-02-10 LAB — COMPREHENSIVE METABOLIC PANEL WITH GFR
ALT: 23 U/L (ref 0–35)
AST: 21 U/L (ref 0–37)
Albumin: 4 g/dL (ref 3.5–5.2)
Alkaline Phosphatase: 49 U/L (ref 39–117)
BUN: 31 mg/dL — ABNORMAL HIGH (ref 6–23)
CO2: 21 meq/L (ref 19–32)
Calcium: 9.5 mg/dL (ref 8.4–10.5)
Chloride: 107 meq/L (ref 96–112)
Creatinine, Ser: 2.16 mg/dL — ABNORMAL HIGH (ref 0.40–1.20)
GFR: 20.84 mL/min — ABNORMAL LOW (ref >60.00)
Glucose, Bld: 185 mg/dL — ABNORMAL HIGH (ref 70–99)
Potassium: 3.7 meq/L (ref 3.5–5.1)
Sodium: 137 meq/L (ref 135–145)
Total Bilirubin: 1 mg/dL (ref 0.2–1.2)
Total Protein: 6.4 g/dL (ref 6.0–8.3)

## 2024-02-10 LAB — LIPID PANEL
Cholesterol: 142 mg/dL (ref 0–200)
HDL: 47.4 mg/dL (ref >39.00)
LDL Cholesterol: 75 mg/dL (ref 0–99)
NonHDL: 94.1
Total CHOL/HDL Ratio: 3
Triglycerides: 97 mg/dL (ref 0.0–149.0)
VLDL: 19.4 mg/dL (ref 0.0–40.0)

## 2024-02-10 LAB — CBC WITH DIFFERENTIAL/PLATELET
Absolute Lymphocytes: 1034 {cells}/uL (ref 850–3900)
Absolute Monocytes: 267 {cells}/uL (ref 200–950)
Basophils Absolute: 0 {cells}/uL (ref 0–200)
Basophils Relative: 0 %
Eosinophils Absolute: 30 {cells}/uL (ref 15–500)
Eosinophils Relative: 1.1 %
HCT: 35 % (ref 35.0–45.0)
Hemoglobin: 12.2 g/dL (ref 11.7–15.5)
MCH: 39.4 pg — ABNORMAL HIGH (ref 27.0–33.0)
MCHC: 34.9 g/dL (ref 32.0–36.0)
MCV: 112.9 fL — ABNORMAL HIGH (ref 80.0–100.0)
MPV: 10.1 fL (ref 7.5–12.5)
Monocytes Relative: 9.9 %
Neutro Abs: 1369 {cells}/uL — ABNORMAL LOW (ref 1500–7800)
Neutrophils Relative %: 50.7 %
Platelets: 232 10*3/uL (ref 140–400)
RBC: 3.1 10*6/uL — ABNORMAL LOW (ref 3.80–5.10)
RDW: 14.7 % (ref 11.0–15.0)
Total Lymphocyte: 38.3 %
WBC: 2.7 10*3/uL — ABNORMAL LOW (ref 3.8–10.8)

## 2024-02-10 LAB — HEMOCCULT GUIAC POC 1CARD (OFFICE): Fecal Occult Blood, POC: POSITIVE — AB

## 2024-02-10 LAB — POC URINALSYSI DIPSTICK (AUTOMATED)
Bilirubin, UA: NEGATIVE
Glucose, UA: NEGATIVE
Ketones, UA: NEGATIVE
Nitrite, UA: NEGATIVE
Protein, UA: POSITIVE — AB
Spec Grav, UA: <=1.005 — AB (ref 1.010–1.025)
Urobilinogen, UA: 0.2 U/dL
pH, UA: 6 (ref 5.0–8.0)

## 2024-02-10 LAB — MICROALBUMIN / CREATININE URINE RATIO
Creatinine,U: 131.7 mg/dL
Microalb Creat Ratio: 35.4 mg/g — ABNORMAL HIGH (ref 0.0–30.0)
Microalb, Ur: 4.7 mg/dL — ABNORMAL HIGH (ref 0.0–1.9)

## 2024-02-10 LAB — HEMOGLOBIN A1C: Hgb A1c MFr Bld: 12.7 % — ABNORMAL HIGH (ref 4.6–6.5)

## 2024-02-10 MED ORDER — CLOPIDOGREL BISULFATE 75 MG PO TABS: 75.0000 mg | ORAL_TABLET | Freq: Every day | ORAL | 1 refills | Status: DC

## 2024-02-10 MED ORDER — AMLODIPINE BESYLATE 5 MG PO TABS: 5.0000 mg | ORAL_TABLET | Freq: Every day | ORAL | 0 refills | Status: DC

## 2024-02-10 NOTE — Patient Instructions (Addendum)
 Please do the ifob (test for blood in stool) and drop it off at the office  Please do the stool tests at home and bring back as well  Also please schedule the brain scan when [facility] calls you to schedule   Hold the plavix  for now  Diarrhea, Adult Diarrhea is frequent loose and sometimes watery bowel movements. Diarrhea can make you feel weak and cause you to become dehydrated. Dehydration is a condition in which there is not enough water or other fluids in the body. Dehydration can make you tired and thirsty, cause you to have a dry mouth, and decrease how often you urinate. Diarrhea typically lasts 2-3 days. However, it can last longer if it is a sign of something more serious. It is important to treat your diarrhea as told by your health care provider. Follow these instructions at home: Eating and drinking     Follow these recommendations as told by your health care provider: Take an oral rehydration solution (ORS). This is an over-the-counter medicine that helps return your body to its normal balance of nutrients and water. It is found at pharmacies and retail stores. Drink enough fluid to keep your urine pale yellow. Drink fluids such as water, diluted fruit juice, and low-calorie sports drinks. You can drink milk also, if desired. Sucking on ice chips is another way to get fluids. Avoid drinking fluids that contain a lot of sugar or caffeine, such as soda, energy drinks, and regular sports drinks. Avoid alcohol. Eat bland, easy-to-digest foods in small amounts as you are able. These foods include bananas, applesauce, rice, lean meats, toast, and crackers. Avoid spicy or fatty foods.  Medicines Take over-the-counter and prescription medicines only as told by your health care provider. If you were prescribed antibiotics, take them as told by your health care provider. Do not stop using the antibiotic even if you start to feel better. General instructions  Wash your  hands often using soap and water for at least 20 seconds. If soap and water are not available, use hand sanitizer. Others in the household should wash their hands as well. Hands should be washed: After using the toilet or changing a diaper. Before preparing, cooking, or serving food. While caring for a sick person or while visiting someone in a hospital. Rest at home while you recover. Take a warm bath to relieve any burning or pain from frequent diarrhea episodes. Watch your condition for any changes. Contact a health care provider if: You have a fever. Your diarrhea gets worse. You have new symptoms. You vomit every time you eat or drink. You feel light-headed, dizzy, or have a headache. You have muscle cramps. You have signs of dehydration, such as: Dark urine, very little urine, or no urine. Cracked lips. Dry mouth. Sunken eyes. Sleepiness. Weakness. You have bloody or black stools or stools that look like tar. You have severe pain, cramping, or bloating in your abdomen. Your skin feels cold and clammy. You feel confused. Get help right away if: You have chest pain or your heart is beating very quickly. You have trouble breathing or you are breathing very quickly. You feel extremely weak or you faint. These symptoms may be an emergency. Get help right away. Call 911. Do not wait to see if the symptoms will go away. Do not drive yourself to the hospital. This information is not intended to replace advice given to you by your health care provider. Make sure you discuss any questions you  have with your health care provider. Document Revised: 02/05/2022 Document Reviewed: 02/05/2022 Elsevier Patient Education  2024 [employer].

## 2024-02-10 NOTE — Telephone Encounter (Signed)
 Called patient and confirmed she should be taking 5 mg tablets. They can half pills if using the 10 mg, but if using 5 mg to take the entire tablet. They expressed understanding.

## 2024-02-10 NOTE — Assessment & Plan Note (Signed)
 Per hematology

## 2024-02-10 NOTE — Progress Notes (Signed)
 Established Patient Office Visit  Subjective   Patient ID: Brandi Mccormick, female    DOB: 22-Jul-1941  Age: 83 y.o. MRN: [id number]  Chief Complaint  Patient presents with   Medical Management of Chronic Issues    Following up on hyperglycemia and hypotension. Pt updates she has not eat for 2 days. Has diarrhea after she eats. Feels weak.    Diarrhea   Weakness    HPI Discussed the use of AI scribe software for clinical note transcription with the patient, who gave verbal consent to proceed.  History of Present Illness Brandi Mccormick is an 83 year old female who presents with diarrhea and weight loss.  She has been experiencing diarrhea for the past three to four days, occurring every time she eats. The diarrhea is watery and brownish, with no visible blood or melena. Initially, her bowel movements were regular, then became small balls, and now are consistently watery. There have been no recent changes in her medications or diet that could have triggered this change. She consumes two saltine crackers and a cup of coffee in the mornings, which do not cause diarrhea.  She reports a significant weight loss of almost ten pounds since February, attributed to her inability to eat due to the diarrhea. She feels weak and notes that people have commented on her appearing 'drunk or high' due to her weakness. She was unable to attend her granddaughter's eighth-grade ceremony due to her condition. No recent fevers or abdominal pain, except for occasional crampy feelings when she needs to use the bathroom.  Her blood pressure has been low, and she has been monitoring her blood sugar levels, which were around seventy to eighty this morning. She has been taking amlodipine . She also mentions taking Plavix , although it appears it has not been filled recently.  She has a history of stroke and was advised to have a follow-up brain scan two years ago, which was not completed.   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   Pneumonia    Hypertension associated with diabetes (HCC) 01/14/2017   Hyperlipidemia 11/15/2016   Polycythemia vera (HCC) 07/01/2014   Protein-calorie malnutrition, severe (HCC) 06/25/2014   Protein-calorie malnutrition (HCC) 06/24/2014   Hypokalemia 06/24/2014   CAP (community acquired pneumonia) 06/24/2014   H/O hyperthyroidism 12/21/2013   Rash 01/24/2013   Past Medical History:  Diagnosis Date   CVA (cerebral vascular accident) (HCC) 10/2020   Diabetes (HCC)    GERD (gastroesophageal reflux disease)    Hyperlipidemia    Hypertension    Hyperthyroidism    Pneumonia    Polycythemia    Shingles    Past Surgical History:  Procedure Laterality Date   CATARACT EXTRACTION Left 2020   COLONOSCOPY     DENTAL SURGERY     IR ANGIO INTRA EXTRACRAN SEL COM CAROTID INNOMINATE BILAT MOD SED  10/09/2020   IR ANGIO VERTEBRAL SEL VERTEBRAL BILAT MOD SED  10/09/2020   Social History   Tobacco Use   Smoking status: Never  Smokeless tobacco: Never  Vaping Use   Vaping status: Never Used  Substance Use Topics   Alcohol use: Yes    Alcohol/week: 0.0 standard drinks of alcohol    Comment: occasional wine cooler   Drug use: No   Social History   Socioeconomic History   Marital status: Married    Spouse name: Not on file   Number of children: Not on file   Years of education: Not on file   Highest education level: Not on file  Occupational  History   Occupation: [employer]--    Comment: retired  Tobacco Use   Smoking status: Never   Smokeless tobacco: Never  Vaping Use   Vaping status: Never Used  Substance and Sexual Activity   Alcohol use: Yes    Alcohol/week: 0.0 standard drinks of alcohol    Comment: occasional wine cooler   Drug use: No   Sexual activity: Not Currently    Partners: Male  Other Topics Concern   Not on file  Social History Narrative   Exercise-- yard work,  Walking in [employer]   03/15/21 lives with husband   Social Drivers of Corporate investment banker Strain: Low Risk  (04/21/2023)   Overall Financial Resource Strain (CARDIA)    Difficulty of Paying Living Expenses: Not hard at all  Food Insecurity: No Food Insecurity (04/21/2023)   Hunger Vital Sign    Worried About Running Out of Food in the Last Year: Never true    Ran Out of Food in the Last Year: Never true  Transportation Needs: No Transportation Needs (04/21/2023)   PRAPARE - Administrator, Mccormick Service (Medical): No    Lack of Transportation (Non-Medical): No  Physical Activity: Inactive (04/21/2023)   Exercise Vital Sign    Days of Exercise per Week: 0 days    Minutes of Exercise per Session: 0 min  Stress: No Stress Concern Present (04/21/2023)   [employer] of Occupational Health - Occupational Stress Questionnaire    Feeling of Stress : Not at all  Social Connections: Moderately Isolated (04/21/2023)   Social Connection and Isolation Panel [NHANES]    Frequency of Communication with Friends and Family: More than three times a week    Frequency of Social Gatherings with Friends and Family: Three times a week    Attends Religious Services: Never    Active Member of Clubs or Organizations: No    Attends Banker Meetings: Never    Marital Status: Married  Catering manager Violence: Not At Risk (04/21/2023)   Humiliation, Afraid, Rape, and Kick questionnaire    Fear of Current or Ex-Partner: No     Emotionally Abused: No    Physically Abused: No    Sexually Abused: No   Family Status  Relation Name Status   Mother  Deceased   Father  Deceased   Sister  Alive   Sister  Alive   Brother  Deceased   Neg Hx  (Not Specified)  No partnership data on file   Family History  Problem Relation Age of Onset   Diabetes Mother        and on mother's side of family   Colon cancer Mother    Stroke Mother    Diabetes Father        and on father's side of family   Diabetes Sister    Diabetes Sister    Diabetes Brother    Heart disease Neg Hx    Rectal cancer  Neg Hx    Stomach cancer Neg Hx    Allergies  Allergen Reactions   Penicillins Nausea And Vomiting    Has patient had a PCN reaction causing immediate rash, facial/tongue/throat swelling, SOB or lightheadedness with hypotension: no Has patient had a PCN reaction causing severe rash involving mucus membranes or skin necrosis: no Has patient had a PCN reaction that required hospitalization: no Has patient had a PCN reaction occurring within the last 10 years: no If all of the above answers are "NO", then may proceed with Cephalosporin use.       Review of Systems  Constitutional:  Negative for fever and malaise/fatigue.  HENT:  Negative for congestion.   Eyes:  Negative for blurred vision.  Respiratory:  Negative for cough and shortness of breath.   Cardiovascular:  Negative for chest pain, palpitations and leg swelling.  Gastrointestinal:  Positive for diarrhea. Negative for blood in stool, constipation, melena and vomiting.  Musculoskeletal:  Negative for back pain.  Skin:  Negative for rash.  Neurological:  Positive for weakness. Negative for loss of consciousness and headaches.      Objective:     BP (!) 106/50 (BP Location: Right Arm, Patient Position: Sitting, Cuff Size: Normal)   Pulse 67   Temp (!) 97.5 F (36.4 C) (Oral)   Wt 111 lb 3.2 oz (50.4 kg)   SpO2 93%   BMI 19.09 kg/m  BP Readings from Last 3  Encounters:  02/10/24 (!) 106/50  02/02/24 (!) 102/58  11/04/23 112/62   Wt Readings from Last 3 Encounters:  02/10/24 111 lb 3.2 oz (50.4 kg)  11/04/23 120 lb 12.8 oz (54.8 kg)  10/29/23 121 lb (54.9 kg)   SpO2 Readings from Last 3 Encounters:  02/10/24 93%  11/04/23 95%  10/28/23 100%      Physical Exam Vitals and nursing note reviewed.  Constitutional:      General: She is not in acute distress.    Appearance: Normal appearance. She is well-developed.  HENT:     Head: Normocephalic and atraumatic.  Eyes:     General: No scleral icterus.       Right eye: No discharge.        Left eye: No discharge.  Cardiovascular:     Rate and Rhythm: Normal rate and regular rhythm.     Heart sounds: No murmur heard. Pulmonary:     Effort: Pulmonary effort is normal. No respiratory distress.     Breath sounds: Normal breath sounds.  Abdominal:     General: Abdomen is flat. There is no distension.     Palpations: Abdomen is soft.     Tenderness: There is no abdominal tenderness. There is no guarding or rebound.  Genitourinary:    Rectum: Guaiac result positive.  Musculoskeletal:        General: Normal range of motion.     Cervical back: Normal range of motion and neck supple.     Right lower leg: No edema.     Left lower leg: No edema.  Skin:    General: Skin is warm and dry.  Neurological:     Mental Status: She is alert and oriented to person, place, and time.  Psychiatric:        Mood and Affect: Mood normal.        Behavior: Behavior normal.        Thought Content: Thought content normal.        Judgment: Judgment normal.  Results for orders placed or performed in visit on 02/10/24  POCT Urinalysis Dipstick (Automated)  Result Value Ref Range   Color, UA yellow    Clarity, UA cloudy    Glucose, UA Negative Negative   Bilirubin, UA neg    Ketones, UA neg    Spec Grav, UA <=1.005 (A) 1.010 - 1.025   Blood, UA trace    pH, UA 6.0 5.0 - 8.0   Protein, UA  Positive (A) Negative   Urobilinogen, UA 0.2 0.2 or 1.0 E.U./dL   Nitrite, UA neg    Leukocytes, UA Small (1+) (A) Negative  POCT Occult Blood Stool  Result Value Ref Range   Fecal Occult Blood, POC Positive (A) Negative   Card #1 Date     Card #2 Fecal Occult Blod, POC     Card #2 Date     Card #3 Fecal Occult Blood, POC     Card #3 Date      Last CBC Lab Results  Component Value Date   WBC 2.7 (L) 01/27/2024   HGB 12.6 01/27/2024   HCT 35.7 (L) 01/27/2024   MCV 110.9 (H) 01/27/2024   MCH 39.1 (H) 01/27/2024   RDW 13.2 01/27/2024   PLT 212 01/27/2024   Last metabolic panel Lab Results  Component Value Date   GLUCOSE 465 (H) 01/27/2024   NA 136 01/27/2024   K 4.7 01/27/2024   CL 100 01/27/2024   CO2 23 01/27/2024   BUN 23 01/27/2024   CREATININE 1.67 (H) 01/27/2024   GFRNONAA 30 (L) 01/27/2024   CALCIUM  10.5 (H) 01/27/2024   PROT 6.9 01/27/2024   ALBUMIN 4.1 01/27/2024   BILITOT 0.8 01/27/2024   ALKPHOS 72 01/27/2024   AST 21 01/27/2024   ALT 39 01/27/2024   ANIONGAP 12 01/27/2024   Last lipids Lab Results  Component Value Date   CHOL 157 08/28/2023   HDL 56.10 08/28/2023   LDLCALC 87 08/28/2023   TRIG 72.0 08/28/2023   CHOLHDL 3 08/28/2023   Last hemoglobin A1c Lab Results  Component Value Date   HGBA1C 11.2 (A) 11/04/2023   Last thyroid  functions Lab Results  Component Value Date   TSH 0.73 08/28/2023   Last vitamin D No results found for: "25OHVITD2", "25OHVITD3", "VD25OH" Last vitamin B12 and Folate No results found for: "VITAMINB12", "FOLATE"    The ASCVD Risk score (Arnett DK, et al., 2019) failed to calculate for the following reasons:   The 2019 ASCVD risk score is only valid for ages 1 to 59   Risk score cannot be calculated because patient has a medical history suggesting prior/existing ASCVD    Assessment & Plan:   Problem List Items Addressed This Visit       Unprioritized   Hyperlipidemia associated with type 2 diabetes  mellitus (HCC)   Relevant Medications   amLODipine  (NORVASC ) 5 MG tablet   Other Relevant Orders   Lipid panel   Comprehensive metabolic panel with GFR   CBC with Differential/Platelet   Uncontrolled type 2 diabetes mellitus with hyperglycemia (HCC) - Primary   Relevant Orders   Hemoglobin A1c   Microalbumin / creatinine urine ratio   Ambulatory referral to Ophthalmology   Myelodysplastic syndrome, unspecified (HCC)   Per hematology      Hypertension associated with diabetes (HCC)   Running low Cut norvasc  10 mg in half to 5 mg       Relevant Medications   amLODipine  (NORVASC ) 5 MG tablet   Cerebrovascular accident (  CVA) (HCC)   Pt has been noncompliant with meds --- not sure if she has been on plavix  or not but it has not been refilled  But not that guaic + --- do not take plavix   Recheck mra--- pt did not have it done when neuro ordered it       Relevant Medications   amLODipine  (NORVASC ) 5 MG tablet   clopidogrel  (PLAVIX ) 75 MG tablet   Other Relevant Orders   MR ANGIO HEAD WO CONTRAST   Other Visit Diagnoses       Hypotension, unspecified hypotension type       Relevant Medications   amLODipine  (NORVASC ) 5 MG tablet     Diarrhea, unspecified type       Relevant Orders   Cdiff NAA+O+P+Stool Culture   Fecal occult blood, imunochemical([facility])   POCT Urinalysis Dipstick (Automated) (Completed)   Ambulatory referral to Gastroenterology     Guaiac + stool       Relevant Orders   Ambulatory referral to Gastroenterology   POCT Occult Blood Stool (Completed)     Microhematuria       Relevant Orders   Urine Culture     Assessment and Plan Assessment & Plan Diarrhea with hematochezia   She has experienced acute diarrhea for 3-4 days with watery, brown stools and crampy abdominal pain. Hematochezia was detected upon examination. The differential diagnosis includes gastrointestinal infection, inflammatory bowel disease, or other causes of gastrointestinal  bleeding. There have been no recent changes in medication or diet, and she has no fever or melena. Order blood work to assess iron and hemoglobin levels. Advise home monitoring of blood pressure and an emergency room visit if hypotension occurs. Instruct her to keep her phone accessible for follow-up on blood work results.  Hypertension   Her blood pressure is currently low, possibly due to weight loss and dietary changes. Adjust the amlodipine  dose from 10 mg to 5 mg to maintain adequate blood pressure control while avoiding hypotension. Send the lower dose prescription to the pharmacy. Advise a follow-up in 2 weeks to reassess blood pressure.  Stroke   She has not completed the recommended follow-up brain scan for her stroke and is not taking Plavix  (clopidogrel ) as prescribed for stroke prevention. Emphasize the importance of medication adherence to prevent another stroke, especially given her caregiving responsibilities. Discuss the importance of taking Plavix  for stroke prevention and consider re-evaluation for stroke follow-up and medication adherence.----  since guiac positive ----  hold plavix  for now   General Health Maintenance   She declined the pneumonia vaccine despite being due and has not had a recent eye examination. Emphasize the importance of vaccinations and regular eye exams for overall health maintenance. Discuss the importance of pneumonia vaccination and encourage scheduling an eye examination.    Return in about 3 months (around 05/12/2024), or if symptoms worsen or fail to improve, for hypertension, hyperlipidemia, diabetes II.    Letica Giaimo R Lowne Chase, DO

## 2024-02-10 NOTE — Telephone Encounter (Signed)
 Please see request for clarification below. Please advise.   Copied from CRM [id number]. Topic: Clinical - Medication Question >> Feb 10, 2024  3:43 PM Alyse July wrote: Reason for CRM: Patient spouse would like a call back for clarification on medication amLODipine  (NORVASC ) 5 MG tablet. Patient spouse was informed that patient needed to take 1/2 of the tablet. Patient spouse would like to know if she need to take 1/2 of the new dose or a whole tablet as patient was previous on amLODipine  (NORVASC ) 10 MG tablet CB# [id number].

## 2024-02-10 NOTE — Assessment & Plan Note (Signed)
 Pt has been noncompliant with meds --- not sure if she has been on plavix  or not but it has not been refilled  But not that guaic + --- do not take plavix   Recheck mra--- pt did not have it done when neuro ordered it

## 2024-02-10 NOTE — Assessment & Plan Note (Signed)
 Running low Cut norvasc  10 mg in half to 5 mg

## 2024-02-11 LAB — URINE CULTURE
MICRO NUMBER:: 16561355
Result:: NO GROWTH
SPECIMEN QUALITY:: ADEQUATE

## 2024-02-12 ENCOUNTER — Other Ambulatory Visit: Payer: Self-pay | Admitting: Family Medicine

## 2024-02-12 DIAGNOSIS — R809 Proteinuria, unspecified: Secondary | ICD-10-CM

## 2024-02-12 MED ORDER — EMPAGLIFLOZIN 10 MG PO TABS: 10.0000 mg | ORAL_TABLET | Freq: Every day | ORAL | 2 refills | Status: DC

## 2024-02-16 ENCOUNTER — Other Ambulatory Visit

## 2024-02-16 NOTE — Addendum Note (Signed)
 Addended by: Marigene Shoulder on: 02/16/2024 10:45 AM   Modules accepted: Orders

## 2024-02-17 ENCOUNTER — Ambulatory Visit: Admitting: Gastroenterology

## 2024-02-17 ENCOUNTER — Encounter: Payer: Self-pay | Admitting: Gastroenterology

## 2024-02-17 VITALS — BP 110/68 | HR 70 | Ht 64.0 in | Wt 112.5 lb

## 2024-02-17 DIAGNOSIS — R195 Other fecal abnormalities: Secondary | ICD-10-CM

## 2024-02-17 DIAGNOSIS — R197 Diarrhea, unspecified: Secondary | ICD-10-CM

## 2024-02-17 DIAGNOSIS — R634 Abnormal weight loss: Secondary | ICD-10-CM | POA: Diagnosis not present

## 2024-02-17 DIAGNOSIS — I639 Cerebral infarction, unspecified: Secondary | ICD-10-CM

## 2024-02-17 NOTE — Patient Instructions (Signed)
 We are giving you hemoccult cards to take home and do.    _______________________________________________________  If your blood pressure at your visit was 140/90 or greater, please contact your primary care physician to follow up on this.  _______________________________________________________  If you are age 82 or older, your body mass index should be between 23-30. Your Body mass index is 19.31 kg/m. If this is out of the aforementioned range listed, please consider follow up with your Primary Care Provider.  If you are age 25 or younger, your body mass index should be between 19-25. Your Body mass index is 19.31 kg/m. If this is out of the aformentioned range listed, please consider follow up with your Primary Care Provider.   ________________________________________________________  The [facility] providers would like to encourage you to use MYCHART to communicate with providers for non-urgent requests or questions.  Due to long hold times on the telephone, sending your provider a message by [facility] may be a faster and more efficient way to get a response.  Please allow 48 business hours for a response.  Please remember that this is for non-urgent requests.  _______________________________________________________  I appreciate the opportunity to care for you. Suzanna Erp, PA

## 2024-02-17 NOTE — Progress Notes (Signed)
 Agree with assessment and plan as outlined.  I defer to your discussion with the patient about repeating stool tests or colonoscopy.  If the patient adamantly does not want a colonoscopy and has no overt bleeding then I would not perform any further stool based testing.  If she wants to do the stool tests and would be willing to do a procedure in that setting if the result is positive, then can check it.  Again would only do the stool based test if she is willing to act on the result.

## 2024-02-17 NOTE — Progress Notes (Signed)
 Chief Complaint: Diarrhea Primary GI MD: Dr. General Kenner  HPI: Discussed the use of AI scribe software for clinical note transcription with the patient, who gave verbal consent to proceed.  History of Present Illness Brandi Mccormick is an 83 year old female who presents with recent diarrhea and weight loss.  She experienced a change in bowel habits over the past few weeks, initially having normal bowel movements that transitioned to small balls and eventually diarrhea. This pattern has since resolved, and her bowel movements are now soft and formed without difficulty. No current abdominal pain, nausea, or vomiting. She is not taking any fiber supplements.  She has lost approximately eight pounds, which she attributes to decreased oral intake during the period of diarrhea, as she felt that 'everything I ate was straight through.'  She states her appetite is improved and her weight back.  No other concerning symptoms are associated with the weight loss.  She enjoys walking but notes needing to steady herself and decide which way to go.  Previous workup as below with CT in 2023 for abdominal pain which was unrevealing and colonoscopy in 2018 with 3 tubular adenomas with patient declined a repeat.  She does have a family history of colon cancer in her mother in her late 2s.  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  But she did have a positive Hemoccult with her PCP.  Patient feels this was due to a digital rectal exam and may be wiping too hard.  She is not interested in endoscopic evaluation at this time.   PREVIOUS GI WORKUP   Prior history: Colonoscopy October 2009 per Dr. Sabra Cramp with finding of sigmoid diverticulosis, no colon polyps.   Colonoscopy 02/05/2017: The perianal and digital rectal examinations were normal. - Two sessile polyps were found in the cecum. The polyps were 2 to 4 mm in size. These polyps were removed  with a cold snare. Resection and retrieval were complete. - A 4 mm polyp was found in the sigmoid colon. The polyp was sessile. The polyp was removed with a cold snare. Resection and retrieval were complete. - Scattered medium-mouthed diverticula were found in the entire colon. - Internal hemorrhoids were found during retroflexion. - The exam was otherwise without abnormality.   Surgical [P], cecum, sigmoid, polyp (3) - TUBULAR ADENOMA. - SEPARATE FRAGMENTS OF BENIGN POLYPOID COLORECTAL MUCOSA WITH LYMPHOID AGGREGATES. - HIGH GRADE DYSPLASIA IS NOT IDENTIFIED.   CT scan 06/02/2018: IMPRESSION: Small amount of left perinephric hemorrhage adjacent the lower pole compatible with recent ultrasound-guided percutaneous biopsy earlier today. Colonic diverticulosis. Aortic Atherosclerosis (ICD10-I70.0). 1.4 cm fatty nodule over the uterine fundus possibly a lipoma. Subtle atherosclerotic coronary artery disease. Mild bibasilar atelectasis.  CTAP W contrast 08/2022: IMPRESSION: -- No evidence of appendicitis, hydronephrosis, or other acute findings. -- Colonic diverticulosis, without radiographic evidence of diverticulitis. -- Stable small uterine lipoleiomyoma.  Past Medical History:  Diagnosis Date   CVA (cerebral vascular accident) (HCC) 10/2020   Diabetes (HCC)    GERD (gastroesophageal reflux disease)    Hyperlipidemia    Hypertension    Hyperthyroidism    Pneumonia    Polycythemia    Shingles     Past Surgical History:  Procedure Laterality Date   CATARACT EXTRACTION Left 2020   COLONOSCOPY     DENTAL SURGERY     IR ANGIO INTRA EXTRACRAN SEL COM CAROTID INNOMINATE BILAT MOD SED  10/09/2020   IR ANGIO  VERTEBRAL SEL VERTEBRAL BILAT MOD SED  10/09/2020    Current Outpatient Medications  Medication Sig Dispense Refill   amLODipine  (NORVASC ) 5 MG tablet Take 1 tablet (5 mg total) by mouth daily. 30 tablet 0   atorvastatin  (LIPITOR) 80 MG tablet Take 1 tablet by mouth once  daily 90 tablet 1   Blood Glucose Monitoring Suppl (TRUE METRIX METER) w/Device KIT Use as advised 1 kit 0   clopidogrel  (PLAVIX ) 75 MG tablet TAKE 1 TABLET BY MOUTH ONCE DAILY . APPOINTMENT REQUIRED FOR FUTURE REFILLS 15 tablet 0   clopidogrel  (PLAVIX ) 75 MG tablet Take 1 tablet (75 mg total) by mouth daily. 90 tablet 1   empagliflozin  (JARDIANCE ) 10 MG TABS tablet Take 1 tablet (10 mg total) by mouth daily before breakfast. 30 tablet 2   ezetimibe  (ZETIA ) 10 MG tablet Take 1 tablet (10 mg total) by mouth daily. 90 tablet 1   glipiZIDE  (GLUCOTROL  XL) 5 MG 24 hr tablet Take 2 tablets (10 mg total) by mouth daily. 180 tablet 0   glucose blood (TRUE METRIX BLOOD GLUCOSE TEST) test strip Use as instructed 2x a day 200 each 3   hydroxyurea  (HYDREA ) 500 MG capsule Take 1 capsule (500 mg total) by mouth daily. May take with food to minimize GI side effects. 90 capsule 1   lisinopril  (ZESTRIL ) 40 MG tablet Take 1 tablet (40 mg total) by mouth daily. 90 tablet 1   metoprolol  succinate (TOPROL -XL) 25 MG 24 hr tablet TAKE 1 TABLET BY MOUTH ONCE DAILY WITH MEAL 90 tablet 0   pantoprazole  (PROTONIX ) 40 MG tablet Take 1 tablet (40 mg total) by mouth daily. 90 tablet 0   polyethylene glycol (MIRALAX ) 17 g packet Take 17 g by mouth daily as needed. 14 each 0   No current facility-administered medications for this visit.    Allergies as of 02/17/2024 - Review Complete 02/17/2024  Allergen Reaction Noted   Penicillins Nausea And Vomiting 01/24/2013    Family History  Problem Relation Age of Onset   Diabetes Mother        and on mother's side of family   Colon cancer Mother    Stroke Mother    Diabetes Father        and on father's side of family   Diabetes Sister    Diabetes Sister    Diabetes Brother    Heart disease Neg Hx    Rectal cancer Neg Hx    Stomach cancer Neg Hx     Social History   Socioeconomic History   Marital status: Married    Spouse name: Not on file   Number of children:  Not on file   Years of education: Not on file   Highest education level: Not on file  Occupational History   Occupation: [employer]--    Comment: retired  Tobacco Use   Smoking status: Never   Smokeless tobacco: Never  Vaping Use   Vaping status: Never Used  Substance and Sexual Activity   Alcohol use: Yes    Alcohol/week: 0.0 standard drinks of alcohol    Comment: occasional wine cooler   Drug use: No   Sexual activity: Not Currently    Partners: Male  Other Topics Concern   Not on file  Social History Narrative   Exercise-- yard work,  Walking in [employer]   03/15/21 lives with husband   Social Drivers of Corporate investment banker Strain: Low Risk  (04/21/2023)   Overall Physicist, medical Strain (  CARDIA)    Difficulty of Paying Living Expenses: Not hard at all  Food Insecurity: No Food Insecurity (04/21/2023)   Hunger Vital Sign    Worried About Running Out of Food in the Last Year: Never true    Ran Out of Food in the Last Year: Never true  Transportation Needs: No Transportation Needs (04/21/2023)   PRAPARE - Administrator, Civil Service (Medical): No    Lack of Transportation (Non-Medical): No  Physical Activity: Inactive (04/21/2023)   Exercise Vital Sign    Days of Exercise per Week: 0 days    Minutes of Exercise per Session: 0 min  Stress: No Stress Concern Present (04/21/2023)   [employer] of Occupational Health - Occupational Stress Questionnaire    Feeling of Stress : Not at all  Social Connections: Moderately Isolated (04/21/2023)   Social Connection and Isolation Panel    Frequency of Communication with Friends and Family: More than three times a week    Frequency of Social Gatherings with Friends and Family: Three times a week    Attends Religious Services: Never    Active Member of Clubs or Organizations: No    Attends Banker Meetings: Never    Marital Status: Married  Catering manager Violence: Not At Risk (04/21/2023)    Humiliation, Afraid, Rape, and Kick questionnaire    Fear of Current or Ex-Partner: No    Emotionally Abused: No    Physically Abused: No    Sexually Abused: No    Review of Systems:    Constitutional: No weight loss, fever, chills, weakness or fatigue HEENT: Eyes: No change in vision               Ears, Nose, Throat:  No change in hearing or congestion Skin: No rash or itching Cardiovascular: No chest pain, chest pressure or palpitations   Respiratory: No SOB or cough Gastrointestinal: See HPI and otherwise negative Genitourinary: No dysuria or change in urinary frequency Neurological: No headache, dizziness or syncope Musculoskeletal: No new muscle or joint pain Hematologic: No bleeding or bruising Psychiatric: No history of depression or anxiety    Physical Exam:  Vital signs: BP 110/68   Pulse 70   Ht 5' 4 (1.626 m)   Wt 112 lb 8 oz (51 kg)   SpO2 95%   BMI 19.31 kg/m   Constitutional: NAD, alert and cooperative.  Appears younger than stated age but is frail and thin and has difficulty getting on exam table Head:  Normocephalic and atraumatic. Eyes:   PEERL, EOMI. No icterus. Conjunctiva pink. Respiratory: Respirations even and unlabored. Lungs clear to auscultation bilaterally.   No wheezes, crackles, or rhonchi.  Cardiovascular:  Regular rate and rhythm. No peripheral edema, cyanosis or pallor.  Gastrointestinal:  Soft, nondistended, nontender. No rebound or guarding. Normal bowel sounds. No appreciable masses or hepatomegaly. Rectal:  Declines Msk:  Symmetrical without gross deformities. Without edema, no deformity or joint abnormality.  Neurologic:  Alert and  oriented x4;  grossly normal neurologically.  Skin:   Dry and intact without significant lesions or rashes. Psychiatric: Oriented to person, place and time. Demonstrates good judgement and reason without abnormal affect or behaviors.   RELEVANT LABS AND IMAGING: CBC    Component Value Date/Time   WBC  2.7 (L) 02/10/2024 1006   RBC 3.10 (L) 02/10/2024 1006   HGB 12.2 02/10/2024 1006   HGB 12.6 01/27/2024 0923   HGB 16.7 (H) 04/10/2017 0902   HCT 35.0  02/10/2024 1006   HCT 48.6 (H) 04/10/2017 0902   PLT 232 02/10/2024 1006   PLT 212 01/27/2024 0923   PLT 807 (H) 04/10/2017 0902   MCV 112.9 (H) 02/10/2024 1006   MCV 78 (L) 04/10/2017 0902   MCH 39.4 (H) 02/10/2024 1006   MCHC 34.9 02/10/2024 1006   RDW 14.7 02/10/2024 1006   RDW 19.8 (H) 04/10/2017 0902   LYMPHSABS 0.9 01/27/2024 0923   LYMPHSABS 1.9 04/10/2017 0902   MONOABS 0.2 01/27/2024 0923   EOSABS 30 02/10/2024 1006   EOSABS 0.2 04/10/2017 0902   BASOSABS 0 02/10/2024 1006   BASOSABS 0.1 04/10/2017 0902    CMP     Component Value Date/Time   NA 137 02/10/2024 1006   NA 138 04/10/2017 0902   K 3.7 02/10/2024 1006   K 3.9 04/10/2017 0902   CL 107 02/10/2024 1006   CL 110 (H) 04/10/2017 0902   CO2 21 02/10/2024 1006   CO2 29 04/10/2017 0902   GLUCOSE 185 (H) 02/10/2024 1006   GLUCOSE 135 (H) 04/10/2017 0902   BUN 31 (H) 02/10/2024 1006   BUN 12 04/10/2017 0902   CREATININE 2.16 (H) 02/10/2024 1006   CREATININE 1.67 (H) 01/27/2024 0923   CREATININE 2.18 (H) 05/11/2020 0939   CALCIUM  9.5 02/10/2024 1006   CALCIUM  9.3 04/10/2017 0902   PROT 6.4 02/10/2024 1006   PROT 7.4 04/10/2017 0902   ALBUMIN 4.0 02/10/2024 1006   ALBUMIN 3.4 04/10/2017 0902   AST 21 02/10/2024 1006   AST 21 01/27/2024 0923   ALT 23 02/10/2024 1006   ALT 39 01/27/2024 0923   ALT 25 04/10/2017 0902   ALKPHOS 49 02/10/2024 1006   ALKPHOS 100 (H) 04/10/2017 0902   BILITOT 1.0 02/10/2024 1006   BILITOT 0.8 01/27/2024 0923   GFRNONAA 30 (L) 01/27/2024 0923   GFRNONAA 71 06/28/2016 0924   GFRAA 24 (L) 06/10/2019 0925   GFRAA 82 06/28/2016 0924     Assessment/Plan:   Diarrhea Diarrhea ongoing for couple weeks now resolved.  She had associated weight loss during this period due to decreased caloric intake which is now improving.   Suspect gastroenteritis that has since resolved.  If recurrent symptoms please let us  know. - If recurrent symptoms please let us  know and we will test for infection with stool studies - If change in bowel habits recommend fiber therapy and please notify us   Positive Hemoccult Positive Hemoccult with PCP.  Patient denies overt bleeding.  No anemia.  Extensive discussion with patient about positive Hemoccult and recommendation for endoscopic evaluation.  Last colonoscopy in 2018 with 3 small tubular adenomas and family history of colon cancer in her mother.  Reassuring CT scan in 2023.  She is on Plavix  so of course this could be a false positive.  Patient states she is not concerned as she does not see bleeding and she would like to avoid endoscopic evaluation.  Ultimately we had joint decision making on plan as below. - Repeat Hemoccults with a series of Hemoccult cards - If all Hemoccult cards are positive would recommend endoscopic evaluation - Patient appears younger than stated age but is somewhat frail/thin.  Will need to hold Plavix  for endoscopic evaluation. - Continue to monitor CBC, currently no anemia  CVA On Plavix   Gigi Kyle [facility] 02/17/2024, 10:12 AM  Cc: Crecencio Dodge, Candida Chalk, *

## 2024-02-18 ENCOUNTER — Other Ambulatory Visit

## 2024-02-21 ENCOUNTER — Encounter (HOSPITAL_COMMUNITY): Payer: Self-pay

## 2024-02-21 ENCOUNTER — Other Ambulatory Visit: Payer: Self-pay

## 2024-02-21 ENCOUNTER — Emergency Department (HOSPITAL_COMMUNITY)

## 2024-02-21 ENCOUNTER — Observation Stay (HOSPITAL_COMMUNITY)
Admission: EM | Admit: 2024-02-21 | Discharge: 2024-02-22 | Disposition: A | Discharge status: Home / Self Care | Attending: Family Medicine | Admitting: Family Medicine

## 2024-02-21 ENCOUNTER — Ambulatory Visit
Admission: RE | Admit: 2024-02-21 | Discharge: 2024-02-21 | Disposition: A | Discharge status: Home / Self Care | Source: Ambulatory Visit | Attending: Family Medicine | Admitting: Family Medicine

## 2024-02-21 DIAGNOSIS — F1090 Alcohol use, unspecified, uncomplicated: Secondary | ICD-10-CM | POA: Insufficient documentation

## 2024-02-21 DIAGNOSIS — Z8639 Personal history of other endocrine, nutritional and metabolic disease: Secondary | ICD-10-CM

## 2024-02-21 DIAGNOSIS — R531 Weakness: Secondary | ICD-10-CM | POA: Insufficient documentation

## 2024-02-21 DIAGNOSIS — R55 Syncope and collapse: Secondary | ICD-10-CM | POA: Diagnosis not present

## 2024-02-21 DIAGNOSIS — I700 Atherosclerosis of aorta: Secondary | ICD-10-CM | POA: Diagnosis not present

## 2024-02-21 DIAGNOSIS — I129 Hypertensive chronic kidney disease with stage 1 through stage 4 chronic kidney disease, or unspecified chronic kidney disease: Secondary | ICD-10-CM | POA: Diagnosis not present

## 2024-02-21 DIAGNOSIS — Z8673 Personal history of transient ischemic attack (TIA), and cerebral infarction without residual deficits: Secondary | ICD-10-CM | POA: Diagnosis not present

## 2024-02-21 DIAGNOSIS — D649 Anemia, unspecified: Secondary | ICD-10-CM | POA: Diagnosis not present

## 2024-02-21 DIAGNOSIS — I959 Hypotension, unspecified: Secondary | ICD-10-CM | POA: Diagnosis not present

## 2024-02-21 DIAGNOSIS — I639 Cerebral infarction, unspecified: Secondary | ICD-10-CM

## 2024-02-21 DIAGNOSIS — I517 Cardiomegaly: Secondary | ICD-10-CM | POA: Diagnosis not present

## 2024-02-21 DIAGNOSIS — E1122 Type 2 diabetes mellitus with diabetic chronic kidney disease: Secondary | ICD-10-CM | POA: Diagnosis not present

## 2024-02-21 DIAGNOSIS — Z79899 Other long term (current) drug therapy: Secondary | ICD-10-CM | POA: Diagnosis not present

## 2024-02-21 DIAGNOSIS — N1832 Chronic kidney disease, stage 3b: Secondary | ICD-10-CM | POA: Insufficient documentation

## 2024-02-21 DIAGNOSIS — R262 Difficulty in walking, not elsewhere classified: Secondary | ICD-10-CM | POA: Insufficient documentation

## 2024-02-21 DIAGNOSIS — E46 Unspecified protein-calorie malnutrition: Secondary | ICD-10-CM | POA: Diagnosis present

## 2024-02-21 DIAGNOSIS — E785 Hyperlipidemia, unspecified: Secondary | ICD-10-CM | POA: Insufficient documentation

## 2024-02-21 DIAGNOSIS — I6621 Occlusion and stenosis of right posterior cerebral artery: Secondary | ICD-10-CM | POA: Diagnosis not present

## 2024-02-21 DIAGNOSIS — I6613 Occlusion and stenosis of bilateral anterior cerebral arteries: Secondary | ICD-10-CM | POA: Diagnosis not present

## 2024-02-21 DIAGNOSIS — R42 Dizziness and giddiness: Secondary | ICD-10-CM | POA: Diagnosis not present

## 2024-02-21 DIAGNOSIS — Z681 Body mass index (BMI) 19 or less, adult: Secondary | ICD-10-CM | POA: Insufficient documentation

## 2024-02-21 DIAGNOSIS — R739 Hyperglycemia, unspecified: Secondary | ICD-10-CM | POA: Diagnosis not present

## 2024-02-21 DIAGNOSIS — I6501 Occlusion and stenosis of right vertebral artery: Secondary | ICD-10-CM | POA: Diagnosis not present

## 2024-02-21 DIAGNOSIS — I951 Orthostatic hypotension: Secondary | ICD-10-CM | POA: Insufficient documentation

## 2024-02-21 LAB — COMPREHENSIVE METABOLIC PANEL WITH GFR
ALT: 25 U/L (ref 0–44)
AST: 28 U/L (ref 15–41)
Albumin: 3.5 g/dL (ref 3.5–5.0)
Alkaline Phosphatase: 49 U/L (ref 38–126)
Anion gap: 7 (ref 5–15)
BUN: 27 mg/dL — ABNORMAL HIGH (ref 8–23)
CO2: 21 mmol/L — ABNORMAL LOW (ref 22–32)
Calcium: 8.7 mg/dL — ABNORMAL LOW (ref 8.9–10.3)
Chloride: 110 mmol/L (ref 98–111)
Creatinine, Ser: 1.54 mg/dL — ABNORMAL HIGH (ref 0.44–1.00)
GFR, Estimated: 34 mL/min — ABNORMAL LOW (ref >60)
Glucose, Bld: 243 mg/dL — ABNORMAL HIGH (ref 70–99)
Potassium: 4.3 mmol/L (ref 3.5–5.1)
Sodium: 138 mmol/L (ref 135–145)
Total Bilirubin: 1.2 mg/dL (ref 0.0–1.2)
Total Protein: 6.5 g/dL (ref 6.5–8.1)

## 2024-02-21 LAB — CBC WITH DIFFERENTIAL/PLATELET
Abs Immature Granulocytes: 0.01 10*3/uL (ref 0.00–0.07)
Basophils Absolute: 0 10*3/uL (ref 0.0–0.1)
Basophils Relative: 0 %
Eosinophils Absolute: 0 10*3/uL (ref 0.0–0.5)
Eosinophils Relative: 1 %
HCT: 29.1 % — ABNORMAL LOW (ref 36.0–46.0)
Hemoglobin: 10.2 g/dL — ABNORMAL LOW (ref 12.0–15.0)
Immature Granulocytes: 0 %
Lymphocytes Relative: 20 %
Lymphs Abs: 0.5 10*3/uL — ABNORMAL LOW (ref 0.7–4.0)
MCH: 40.3 pg — ABNORMAL HIGH (ref 26.0–34.0)
MCHC: 35.1 g/dL (ref 30.0–36.0)
MCV: 115 fL — ABNORMAL HIGH (ref 80.0–100.0)
Monocytes Absolute: 0.1 10*3/uL (ref 0.1–1.0)
Monocytes Relative: 5 %
Neutro Abs: 1.9 10*3/uL (ref 1.7–7.7)
Neutrophils Relative %: 74 %
Platelets: 131 10*3/uL — ABNORMAL LOW (ref 150–400)
RBC: 2.53 MIL/uL — ABNORMAL LOW (ref 3.87–5.11)
RDW: 14.5 % (ref 11.5–15.5)
WBC: 2.6 10*3/uL — ABNORMAL LOW (ref 4.0–10.5)
nRBC: 0 % (ref 0.0–0.2)

## 2024-02-21 LAB — I-STAT CHEM 8, ED
BUN: 35 mg/dL — ABNORMAL HIGH (ref 8–23)
Calcium, Ion: 1.23 mmol/L (ref 1.15–1.40)
Chloride: 109 mmol/L (ref 98–111)
Creatinine, Ser: 2.5 mg/dL — ABNORMAL HIGH (ref 0.44–1.00)
Glucose, Bld: 248 mg/dL — ABNORMAL HIGH (ref 70–99)
HCT: 28 % — ABNORMAL LOW (ref 36.0–46.0)
Hemoglobin: 9.5 g/dL — ABNORMAL LOW (ref 12.0–15.0)
Potassium: 4.3 mmol/L (ref 3.5–5.1)
Sodium: 141 mmol/L (ref 135–145)
TCO2: 21 mmol/L — ABNORMAL LOW (ref 22–32)

## 2024-02-21 LAB — URINALYSIS, W/ REFLEX TO CULTURE (INFECTION SUSPECTED)
Bilirubin Urine: NEGATIVE
Glucose, UA: >=500 mg/dL — AB
Hgb urine dipstick: NEGATIVE
Ketones, ur: NEGATIVE mg/dL
Leukocytes,Ua: NEGATIVE
Nitrite: NEGATIVE
Protein, ur: NEGATIVE mg/dL
Specific Gravity, Urine: 1.013 (ref 1.005–1.030)
pH: 5 (ref 5.0–8.0)

## 2024-02-21 LAB — RETICULOCYTES
Immature Retic Fract: 18.2 % — ABNORMAL HIGH (ref 2.3–15.9)
RBC.: 2.54 MIL/uL — ABNORMAL LOW (ref 3.87–5.11)
Retic Count, Absolute: 35.8 10*3/uL (ref 19.0–186.0)
Retic Ct Pct: 1.4 % (ref 0.4–3.1)

## 2024-02-21 LAB — I-STAT CG4 LACTIC ACID, ED
Lactic Acid, Venous: 0.9 mmol/L (ref 0.5–1.9)
Lactic Acid, Venous: 1 mmol/L (ref 0.5–1.9)

## 2024-02-21 LAB — PROTIME-INR
INR: 1 (ref 0.8–1.2)
Prothrombin Time: 12.9 s (ref 11.4–15.2)

## 2024-02-21 LAB — GLUCOSE, CAPILLARY
Glucose-Capillary: 131 mg/dL — ABNORMAL HIGH (ref 70–99)
Glucose-Capillary: 83 mg/dL (ref 70–99)

## 2024-02-21 LAB — IRON AND TIBC
Iron: 74 ug/dL (ref 28–170)
Saturation Ratios: 31 % (ref 10.4–31.8)
TIBC: 241 ug/dL — ABNORMAL LOW (ref 250–450)
UIBC: 167 ug/dL

## 2024-02-21 LAB — POC OCCULT BLOOD, ED: Fecal Occult Bld: NEGATIVE

## 2024-02-21 LAB — FERRITIN: Ferritin: 150 ng/mL (ref 11–307)

## 2024-02-21 LAB — TROPONIN I (HIGH SENSITIVITY)
Troponin I (High Sensitivity): 16 ng/L (ref <18)
Troponin I (High Sensitivity): 13 ng/L (ref <18)

## 2024-02-21 LAB — TRANSFERRIN: Transferrin: 172 mg/dL — ABNORMAL LOW (ref 192–382)

## 2024-02-21 MED ORDER — ONDANSETRON HCL 4 MG/2ML IJ SOLN
4.0000 mg | Freq: Four times a day (QID) | INTRAMUSCULAR | Status: DC | PRN
Start: 2024-02-21 — End: 2024-02-22

## 2024-02-21 MED ORDER — ATORVASTATIN CALCIUM 40 MG PO TABS
80.0000 mg | ORAL_TABLET | Freq: Every day | ORAL | Status: DC
  Administered 2024-02-22: 80 mg via ORAL
  Filled 2024-02-21: qty 2

## 2024-02-21 MED ORDER — INSULIN ASPART 100 UNIT/ML IJ SOLN
0.0000 [IU] | Freq: Three times a day (TID) | INTRAMUSCULAR | Status: DC
  Administered 2024-02-21: 2 [IU] via SUBCUTANEOUS
  Administered 2024-02-22: 3 [IU] via SUBCUTANEOUS
  Filled 2024-02-21: qty 0.15

## 2024-02-21 MED ORDER — PANTOPRAZOLE SODIUM 40 MG IV SOLR
80.0000 mg | Freq: Once | INTRAVENOUS | Status: AC
  Administered 2024-02-21: 80 mg via INTRAVENOUS
  Filled 2024-02-21: qty 20

## 2024-02-21 MED ORDER — HYDROXYUREA 500 MG PO CAPS: 500.0000 mg | ORAL_CAPSULE | Freq: Every day | ORAL | Status: DC

## 2024-02-21 MED ORDER — METOPROLOL SUCCINATE ER 25 MG PO TB24
25.0000 mg | ORAL_TABLET | Freq: Every day | ORAL | Status: DC
  Administered 2024-02-22: 25 mg via ORAL
  Filled 2024-02-21: qty 1

## 2024-02-21 MED ORDER — INSULIN ASPART 100 UNIT/ML IJ SOLN
0.0000 [IU] | Freq: Every day | INTRAMUSCULAR | Status: DC
  Filled 2024-02-21: qty 0.05

## 2024-02-21 MED ORDER — ONDANSETRON HCL 4 MG PO TABS: 4.0000 mg | ORAL_TABLET | Freq: Four times a day (QID) | ORAL | Status: DC | PRN

## 2024-02-21 MED ORDER — EZETIMIBE 10 MG PO TABS
10.0000 mg | ORAL_TABLET | Freq: Every day | ORAL | Status: DC
  Administered 2024-02-22: 10 mg via ORAL
  Filled 2024-02-21: qty 1

## 2024-02-21 MED ORDER — PANTOPRAZOLE SODIUM 40 MG IV SOLR
40.0000 mg | Freq: Two times a day (BID) | INTRAVENOUS | Status: DC
  Administered 2024-02-21 – 2024-02-22 (×2): 40 mg via INTRAVENOUS
  Filled 2024-02-21 (×2): qty 10

## 2024-02-21 MED ORDER — ACETAMINOPHEN 650 MG RE SUPP
650.0000 mg | Freq: Four times a day (QID) | RECTAL | Status: DC | PRN
Start: 2024-02-21 — End: 2024-02-22

## 2024-02-21 MED ORDER — ACETAMINOPHEN 325 MG PO TABS: 650.0000 mg | ORAL_TABLET | Freq: Four times a day (QID) | ORAL | Status: DC | PRN

## 2024-02-21 MED ORDER — EMPAGLIFLOZIN 10 MG PO TABS
10.0000 mg | ORAL_TABLET | Freq: Every day | ORAL | Status: DC
  Administered 2024-02-22: 10 mg via ORAL
  Filled 2024-02-21: qty 1

## 2024-02-21 NOTE — Plan of Care (Signed)
  Problem: Education: Goal: Ability to describe self-care measures that may prevent or decrease complications (Diabetes Survival Skills Education) will improve Outcome: Progressing   Problem: Coping: Goal: Ability to adjust to condition or change in health will improve Outcome: Progressing

## 2024-02-21 NOTE — ED Triage Notes (Signed)
 EMS reports from home, called out for dizziness and near syncopal episode. Pt coming home from grocery store, and had witnessed episode by husband. Pt currently under eval for GI bleed. Pt orthostatic on standing at scene.  BP 130/56 HR 78 RR 18 Sp02 100 RA CBG 263  18 L forearm NS

## 2024-02-21 NOTE — H&P (Addendum)
 History and Physical    Brandi Mccormick:[id number] DOB: 1941/01/25 DOA: 02/21/2024  PCP: Antonio Cyndee [name], DO  Patient coming from: Home  I have personally briefly reviewed patient's old medical records in [facility]  Chief Complaint: Episode of passing out  HPI: Brandi Mccormick is a 83 y.o. female with medical history significant of hypertension, type 2 diabetes mellitus,-CAD, hyperlipidemia, GERD who was brought into the ED by her husband due to an episode of passing out.  History was obtained from the patient and supported by the husband who is at the bedside.  According to the history, patient returned home from grocery store, after walking into the house from the car porch, when she sat down in the chair, husband noted that she suddenly slumped backward, eyes rolled back.  He thought that she was having a stroke.  She never closed her eyes.  This episode lasted for 2 to 3 seconds according to him.  He thinks that she lost consciousness for those few seconds.  The patient herself does not recall the events.  According to patient, she did not have any prodromal symptoms prior to this episode and she was fully alert and oriented after this episode as well so there was no postictal confusion.  No focal weakness.  For further workup, she was brought into the emergency department.  Patient endorses being very weak for the last 3 weeks to the point that she is now using the support of the walls to walk even inside the house.  According to husband, this weakness has been ongoing for about 3 weeks.  Chart reviewed, it sounds like patient saw GI just 4 days ago for diarrhea and weight loss.  According to the notes, she attributed that to poor p.o. intake as well as diarrhea.  Her last colonoscopy was done in 2018 which showed 3 tubular adenomas but patient had declined to repeat colonoscopy.  She has a history of CVA and she is on Plavix  chronically.  She was also found to have anemia as well  as positive FOBT at PCPs office about 11 days ago however patient denies any melena, hematochezia or hematemesis.  ED Course: Upon arrival, she was hemodynamically stable.  Mild hyperglycemia on BMP and creatinine 1.5 which is her baseline.  Troponins negative, no acute ST-T wave changes.  Lactic acid normal.  FOBT negative in the ED.  Hemoglobin however has declined from 12.211 days ago to 10.2 today.  She has chronic leukopenia.  She was given Protonix  in the ED and hospitalist were called for admission.  Review of Systems: As per HPI otherwise negative.    Past Medical History:  Diagnosis Date   CVA (cerebral vascular accident) (HCC) 10/2020   Diabetes (HCC)    GERD (gastroesophageal reflux disease)    Hyperlipidemia    Hypertension    Hyperthyroidism    Pneumonia    Polycythemia    Shingles     Past Surgical History:  Procedure Laterality Date   CATARACT EXTRACTION Left 2020   COLONOSCOPY     DENTAL SURGERY     IR ANGIO INTRA EXTRACRAN SEL COM CAROTID INNOMINATE BILAT MOD SED  10/09/2020   IR ANGIO VERTEBRAL SEL VERTEBRAL BILAT MOD SED  10/09/2020     reports that she has never smoked. She has never used smokeless tobacco. She reports current alcohol use. She reports that she does not use drugs.  Allergies  Allergen Reactions   Penicillins Nausea And Vomiting  Has patient had a PCN reaction causing immediate rash, facial/tongue/throat swelling, SOB or lightheadedness with hypotension: no Has patient had a PCN reaction causing severe rash involving mucus membranes or skin necrosis: no Has patient had a PCN reaction that required hospitalization: no Has patient had a PCN reaction occurring within the last 10 years: no If all of the above answers are NO, then may proceed with Cephalosporin use.     Family History  Problem Relation Age of Onset   Diabetes Mother        and on mother's side of family   Colon cancer Mother    Stroke Mother    Diabetes Father        and  on father's side of family   Diabetes Sister    Diabetes Sister    Diabetes Brother    Heart disease Neg Hx    Rectal cancer Neg Hx    Stomach cancer Neg Hx     Prior to Admission medications   Medication Sig Start Date End Date Taking? Authorizing Provider  amLODipine  (NORVASC ) 5 MG tablet Take 1 tablet (5 mg total) by mouth daily. 02/10/24   Lowne Chase, Yvonne R, DO  atorvastatin  (LIPITOR) 80 MG tablet Take 1 tablet by mouth once daily 10/16/23   Antonio Meth, Yvonne R, DO  Blood Glucose Monitoring Suppl (TRUE METRIX METER) w/Device KIT Use as advised 07/04/23   Trixie File, MD  clopidogrel  (PLAVIX ) 75 MG tablet TAKE 1 TABLET BY MOUTH ONCE DAILY . APPOINTMENT REQUIRED FOR FUTURE REFILLS 02/18/23   Whitfield Raisin, NP  clopidogrel  (PLAVIX ) 75 MG tablet Take 1 tablet (75 mg total) by mouth daily. 02/10/24   Antonio Meth [name], DO  empagliflozin  (JARDIANCE ) 10 MG TABS tablet Take 1 tablet (10 mg total) by mouth daily before breakfast. 02/12/24   Antonio Meth, [name], DO  ezetimibe  (ZETIA ) 10 MG tablet Take 1 tablet (10 mg total) by mouth daily. 09/05/23   Antonio Meth [name], DO  glipiZIDE  (GLUCOTROL  XL) 5 MG 24 hr tablet Take 2 tablets (10 mg total) by mouth daily. 02/02/24   Cyndi Shaver, PA-C  glucose blood (TRUE METRIX BLOOD GLUCOSE TEST) test strip Use as instructed 2x a day 07/04/23   Trixie File, MD  hydroxyurea  (HYDREA ) 500 MG capsule Take 1 capsule (500 mg total) by mouth daily. May take with food to minimize GI side effects. 10/28/23   Cloretta Arley NOVAK, MD  lisinopril  (ZESTRIL ) 40 MG tablet Take 1 tablet (40 mg total) by mouth daily. 09/04/23   Lowne Chase, Yvonne R, DO  metoprolol  succinate (TOPROL -XL) 25 MG 24 hr tablet TAKE 1 TABLET BY MOUTH ONCE DAILY WITH MEAL 01/05/24   Antonio Meth, Yvonne R, DO  pantoprazole  (PROTONIX ) 40 MG tablet Take 1 tablet (40 mg total) by mouth daily. 02/02/24   Lowne Chase, Yvonne R, DO  polyethylene glycol (MIRALAX ) 17 g packet Take 17 g by mouth  daily as needed. 08/02/22   Leigh Elspeth SQUIBB, MD    Physical Exam: Vitals:   02/21/24 1416 02/21/24 1540  BP: 129/60 (!) 144/88  Pulse: 69 69  Resp: 14 10  Temp: 98.4 F (36.9 C)   TempSrc: Oral   SpO2: 100% 100%  Weight: 49.9 kg   Height: 5' 4 (1.626 m)     Constitutional: NAD, calm, comfortable Vitals:   02/21/24 1416 02/21/24 1540  BP: 129/60 (!) 144/88  Pulse: 69 69  Resp: 14 10  Temp: 98.4 F (36.9 C)  TempSrc: Oral   SpO2: 100% 100%  Weight: 49.9 kg   Height: 5' 4 (1.626 m)    Eyes: PERRL, lids and conjunctivae normal ENMT: Mucous membranes are moist. Posterior pharynx clear of any exudate or lesions.Normal dentition.  Neck: normal, supple, no masses, no thyromegaly Respiratory: clear to auscultation bilaterally, no wheezing, no crackles. Normal respiratory effort. No accessory muscle use.  Cardiovascular: Regular rate and rhythm, no murmurs / rubs / gallops. No extremity edema. 2+ pedal pulses. No carotid bruits.  Abdomen: no tenderness, no masses palpated. No hepatosplenomegaly. Bowel sounds positive.  Musculoskeletal: no clubbing / cyanosis. No joint deformity upper and lower extremities. Good ROM, no contractures. Normal muscle tone.  Skin: no rashes, lesions, ulcers. No induration Neurologic: CN 2-12 grossly intact. Sensation intact, DTR normal. Strength 5/5 in all 4.  Psychiatric: Normal judgment and insight. Alert and oriented x 3. Normal mood.    Labs on Admission: I have personally reviewed following labs and imaging studies  CBC: Recent Labs  Lab 02/21/24 1430 02/21/24 1439  WBC 2.6*  --   NEUTROABS 1.9  --   HGB 10.2* 9.5*  HCT 29.1* 28.0*  MCV 115.0*  --   PLT 131*  --    Basic Metabolic Panel: Recent Labs  Lab 02/21/24 1430 02/21/24 1439  NA 138 141  K 4.3 4.3  CL 110 109  CO2 21*  --   GLUCOSE 243* 248*  BUN 27* 35*  CREATININE 1.54* 2.50*  CALCIUM  8.7*  --    GFR: Estimated Creatinine Clearance: 13.7 mL/min (A) (by C-G  formula based on SCr of 2.5 mg/dL (H)). Liver Function Tests: Recent Labs  Lab 02/21/24 1430  AST 28  ALT 25  ALKPHOS 49  BILITOT 1.2  PROT 6.5  ALBUMIN 3.5   No results for input(s): LIPASE, AMYLASE in the last 168 hours. No results for input(s): AMMONIA in the last 168 hours. Coagulation Profile: Recent Labs  Lab 02/21/24 1430  INR 1.0   Cardiac Enzymes: No results for input(s): CKTOTAL, CKMB, CKMBINDEX, TROPONINI in the last 168 hours. BNP (last 3 results) No results for input(s): PROBNP in the last 8760 hours. HbA1C: No results for input(s): HGBA1C in the last 72 hours. CBG: No results for input(s): GLUCAP in the last 168 hours. Lipid Profile: No results for input(s): CHOL, HDL, LDLCALC, TRIG, CHOLHDL, LDLDIRECT in the last 72 hours. Thyroid  Function Tests: No results for input(s): TSH, T4TOTAL, FREET4, T3FREE, THYROIDAB in the last 72 hours. Anemia Panel: No results for input(s): VITAMINB12, FOLATE, FERRITIN, TIBC, IRON, RETICCTPCT in the last 72 hours. Urine analysis:    Component Value Date/Time   COLORURINE STRAW (A) 02/21/2024 1540   APPEARANCEUR CLEAR 02/21/2024 1540   LABSPEC 1.013 02/21/2024 1540   PHURINE 5.0 02/21/2024 1540   GLUCOSEU >=500 (A) 02/21/2024 1540   HGBUR NEGATIVE 02/21/2024 1540   BILIRUBINUR NEGATIVE 02/21/2024 1540   BILIRUBINUR neg 02/10/2024 1027   KETONESUR NEGATIVE 02/21/2024 1540   PROTEINUR NEGATIVE 02/21/2024 1540   UROBILINOGEN 0.2 02/10/2024 1027   UROBILINOGEN 1.0 10/23/2013 2011   NITRITE NEGATIVE 02/21/2024 1540   LEUKOCYTESUR NEGATIVE 02/21/2024 1540    Radiological Exams on Admission: DG Chest Port 1 View Result Date: 02/21/2024 CLINICAL DATA:  Dizziness and near syncopal episode. EXAM: PORTABLE CHEST 1 VIEW COMPARISON:  10/06/2020. FINDINGS: The heart is mildly enlarged and mediastinal contours are within normal limits. There is atherosclerotic calcification of the  aorta. No consolidation, effusion, or pneumothorax is seen. No acute osseous  abnormality. IMPRESSION: No active disease. Electronically Signed   By: Leita Birmingham M.D.   On: 02/21/2024 14:52    EKG: Independently reviewed.  Sinus rhythm with possible left atrial enlargement.  Assessment/Plan Principal Problem:   Near syncope   Near syncope/?  Syncope: It is unclear if this was an episode of syncope versus near syncope.  It could be any of those and I do suspect dehydration/hypotension as the source as she just returned from grocery store and it is very hot outside.  For this reason, I am going to hold her and hypertensives and we will be checking.  Orthostatic vital signs.  To complete the workup, we will continue to monitor on telemetry for any arrhythmia and will check transthoracic echo.  Anemia could also be the source.  We will repeat her hemoglobin later today as well as tomorrow morning.  Repeat FOBT.  Although my suspicion for seizure activity is very low however for the sake of completeness, we will check EEG.  Acute on chronic anemia: Suspected GI bleed as the source of anemia and anemia is the source of possible syncope.  As mentioned above, repeating H&H later today and tomorrow morning.  Continue Protonix .  Essential hypertension: Blood pressure within normal range however due to risk of hypotension and suspicion of orthostatic hypotension: I am going to hold amlodipine  and lisinopril  that she takes PTA but resume Toprol -XL.  History of CVA: Holding Plavix  for now due to risk of GI bleed.  CKD stage IIIb: Patient's baseline creatinine appears to be around 1.5-1.6.  Currently she is at baseline.  Type 2 diabetes mellitus: She is on Jardiance , glipizide  PTA.  Resume Jardiance .  Hold glipizide .  Start on SSI.  Hyperlipidemia: Resume statin.  Generalized weakness: Consult PT OT.  DVT prophylaxis: SCDs Start: 02/21/24 1642 Code Status: Full code Family Communication: Husband  present at bedside.  Plan of care discussed with patient in length and he verbalized understanding and agreed with it. Disposition Plan: Potential discharge tomorrow Consults called: None  Fredia Skeeter MD [facility]  *Please note that this is a verbal dictation therefore any spelling or grammatical errors are due to the Dragon Medical One system interpretation.  Please page via Amion and do not message via secure chat for urgent patient care matters. Secure chat can be used for non urgent patient care matters. 02/21/2024, 4:54 PM  To contact the attending provider between 7A-7P or the covering provider during after hours 7P-7A, please log into the web site [email or web address]

## 2024-02-21 NOTE — Progress Notes (Signed)
 Prior-To-Admission Oral Chemotherapy for Treatment of Oncologic Disease   Order noted from Dr. Vernon to continue prior-to-admission oral chemotherapy regimen of Hydrea  500mg  daily.  Procedure Per Pharmacy & Therapeutics Committee Policy: Orders for continuation of home oral chemotherapy for treatment of an oncologic disease will be held unless approved by an oncologist during current admission.    For patients receiving oncology care at [facility], inpatient pharmacist contacts patient's oncologist during regular office hours to review. If earlier review is medically necessary, attending physician consults [facility] on-call oncologist   For patients receiving oncology care outside of [facility], attending physician consults patient's oncologist to review. If this oncologist or their coverage cannot be reached, attending physician consults [facility] on-call oncologist   Oral chemotherapy continuation order is on hold pending oncologist review, [facility] oncologist Dr. Cloretta will be notified by inpatient pharmacy during office hours     Lacinda Moats, PharmD Clinical Pharmacist  6/21/20255:39 PM

## 2024-02-21 NOTE — ED Provider Notes (Signed)
 [facility]ENC[facility] DEPARTMENT AT [facility] Provider Note   CSN: [id number] Arrival date & time: 02/21/24  1351     Patient presents with: Near Syncope   Brandi Mccormick is a 83 y.o. female.   83 year old female with prior medical history as detailed below presents for evaluation.  Patient reports that she was coming home from a trip to the grocery store.  As she entered the house the patient either passed out completely or had near syncope.  This episode was witnessed by the husband.  Patient reports that she does not think she passed out completely.  Husband reports that she may have had brief complete LOC.  On evaluation in the ED the patient feels improved.  She reports that she feels lightheaded and weak if she exerts herself.  This appears to have been an ongoing issue for the last several weeks.  Of note, she was seen by [facility] on the 17th.  She reportedly has had guaiac positive stools.  She has a gradually worsening anemia over the last several weeks.  There is a concern for occult GI bleed.  The history is provided by the patient and medical records.       Prior to Admission medications   Medication Sig Start Date End Date Taking? Authorizing Provider  amLODipine  (NORVASC ) 5 MG tablet Take 1 tablet (5 mg total) by mouth daily. 02/10/24   Lowne Chase, Yvonne R, DO  atorvastatin  (LIPITOR) 80 MG tablet Take 1 tablet by mouth once daily 10/16/23   Antonio Meth, Yvonne R, DO  Blood Glucose Monitoring Suppl (TRUE METRIX METER) w/Device KIT Use as advised 07/04/23   Trixie File, MD  clopidogrel  (PLAVIX ) 75 MG tablet TAKE 1 TABLET BY MOUTH ONCE DAILY . APPOINTMENT REQUIRED FOR FUTURE REFILLS 02/18/23   Whitfield Raisin, NP  clopidogrel  (PLAVIX ) 75 MG tablet Take 1 tablet (75 mg total) by mouth daily. 02/10/24   Antonio Meth [name], DO  empagliflozin  (JARDIANCE ) 10 MG TABS tablet Take 1 tablet (10 mg total) by mouth daily before breakfast. 02/12/24   Antonio Meth,  Yvonne R, DO  ezetimibe  (ZETIA ) 10 MG tablet Take 1 tablet (10 mg total) by mouth daily. 09/05/23   Antonio Meth [name], DO  glipiZIDE  (GLUCOTROL  XL) 5 MG 24 hr tablet Take 2 tablets (10 mg total) by mouth daily. 02/02/24   Cyndi Shaver, PA-C  glucose blood (TRUE METRIX BLOOD GLUCOSE TEST) test strip Use as instructed 2x a day 07/04/23   Trixie File, MD  hydroxyurea  (HYDREA ) 500 MG capsule Take 1 capsule (500 mg total) by mouth daily. May take with food to minimize GI side effects. 10/28/23   Cloretta Arley NOVAK, MD  lisinopril  (ZESTRIL ) 40 MG tablet Take 1 tablet (40 mg total) by mouth daily. 09/04/23   Lowne Chase, Yvonne R, DO  metoprolol  succinate (TOPROL -XL) 25 MG 24 hr tablet TAKE 1 TABLET BY MOUTH ONCE DAILY WITH MEAL 01/05/24   Lowne Chase, Yvonne R, DO  pantoprazole  (PROTONIX ) 40 MG tablet Take 1 tablet (40 mg total) by mouth daily. 02/02/24   Lowne Chase, Yvonne R, DO  polyethylene glycol (MIRALAX ) 17 g packet Take 17 g by mouth daily as needed. 08/02/22   Armbruster, Elspeth SQUIBB, MD    Allergies: Penicillins    Review of Systems  Cardiovascular:  Positive for near-syncope.  All other systems reviewed and are negative.   Updated Vital Signs BP (!) 144/88   Pulse 69   Temp 98.4 F (  36.9 C) (Oral)   Resp 10   Ht 5' 4 (1.626 m)   Wt 49.9 kg   SpO2 100%   BMI 18.88 kg/m   Physical Exam Vitals and nursing note reviewed.  Constitutional:      General: She is not in acute distress.    Appearance: Normal appearance. She is well-developed.  HENT:     Head: Normocephalic and atraumatic.   Eyes:     Conjunctiva/sclera: Conjunctivae normal.     Pupils: Pupils are equal, round, and reactive to light.    Cardiovascular:     Rate and Rhythm: Normal rate and regular rhythm.     Heart sounds: Normal heart sounds.  Pulmonary:     Effort: Pulmonary effort is normal. No respiratory distress.     Breath sounds: Normal breath sounds.  Abdominal:     General: There is no distension.      Palpations: Abdomen is soft.     Tenderness: There is no abdominal tenderness.  Genitourinary:    Comments: No melena present with DRE.  Guaiac is negative.  Musculoskeletal:        General: No deformity. Normal range of motion.     Cervical back: Normal range of motion and neck supple.   Skin:    General: Skin is warm and dry.   Neurological:     General: No focal deficit present.     Mental Status: She is alert and oriented to person, place, and time.     (all labs ordered are listed, but only abnormal results are displayed) Labs Reviewed  CBC WITH DIFFERENTIAL/PLATELET - Abnormal; Notable for the following components:      Result Value   WBC 2.6 (*)    RBC 2.53 (*)    Hemoglobin 10.2 (*)    HCT 29.1 (*)    MCV 115.0 (*)    MCH 40.3 (*)    Platelets 131 (*)    Lymphs Abs 0.5 (*)    All other components within normal limits  COMPREHENSIVE METABOLIC PANEL WITH GFR - Abnormal; Notable for the following components:   CO2 21 (*)    Glucose, Bld 243 (*)    BUN 27 (*)    Creatinine, Ser 1.54 (*)    Calcium  8.7 (*)    GFR, Estimated 34 (*)    All other components within normal limits  URINALYSIS, W/ REFLEX TO CULTURE (INFECTION SUSPECTED) - Abnormal; Notable for the following components:   Color, Urine STRAW (*)    Glucose, UA >=500 (*)    Bacteria, UA RARE (*)    All other components within normal limits  I-STAT CHEM 8, ED - Abnormal; Notable for the following components:   BUN 35 (*)    Creatinine, Ser 2.50 (*)    Glucose, Bld 248 (*)    TCO2 21 (*)    Hemoglobin 9.5 (*)    HCT 28.0 (*)    All other components within normal limits  PROTIME-INR  POC OCCULT BLOOD, ED  I-STAT CG4 LACTIC ACID, ED  I-STAT CG4 LACTIC ACID, ED  TROPONIN I (HIGH SENSITIVITY)  TROPONIN I (HIGH SENSITIVITY)    EKG: EKG Interpretation Date/Time:  Saturday February 21 2024 14:18:09 EDT Ventricular Rate:  71 PR Interval:  192 QRS Duration:  79 QT Interval:  455 QTC Calculation: 495 R  Axis:   43  Text Interpretation: Sinus rhythm Probable left atrial enlargement Probable anterior infarct, age indeterminate Lateral leads are also involved Confirmed by Laurice Coy (  [id number]) on 02/21/2024 2:21:47 PM  Radiology: [facility] Chest Port 1 View Result Date: 02/21/2024 CLINICAL DATA:  Dizziness and near syncopal episode. EXAM: PORTABLE CHEST 1 VIEW COMPARISON:  10/06/2020. FINDINGS: The heart is mildly enlarged and mediastinal contours are within normal limits. There is atherosclerotic calcification of the aorta. No consolidation, effusion, or pneumothorax is seen. No acute osseous abnormality. IMPRESSION: No active disease. Electronically Signed   By: Leita Birmingham M.D.   On: 02/21/2024 14:52     Procedures   Medications Ordered in the ED  pantoprazole  (PROTONIX ) injection 80 mg (80 mg Intravenous Given 02/21/24 1527)                                    Medical Decision Making Amount and/or Complexity of Data Reviewed Labs: ordered. Radiology: ordered.  Risk Prescription drug management.    Medical Screen Complete  This patient presented to the ED with complaint of syncope versus near syncope.  This complaint involves an extensive number of treatment options. The initial differential diagnosis includes, but is not limited to, anemia, metabolic abnormality, GI bleed, etc.  This presentation is: Acute, Chronic, Self-Limited, Previously Undiagnosed, Uncertain Prognosis, Complicated, Systemic Symptoms, and Threat to Life/Bodily Function  Patient is presenting with apparent syncope versus near syncope.  Also, patient is being worked up for possible GI bleed.  Patient reports that she feels improved on evaluation.  Screening labs are remarkable for white count of 2.6, hemoglobin 10.2.  Patient's hemoglobin is slowly drifting down from 1 month ago.  Creatinine today is 1.5 with a BUN of 27.  Sodium is 138.  Potassium is 4.3.  Stool guaiac is negative.  Given patient's  reported syncopal or near syncopal event and apparent possible worsening anemia I feel that she would benefit from overnight observation / admission for further workup.  Hospitalist service is aware of case.   Additional history obtained:  External records from outside sources obtained and reviewed including prior ED visits and prior Inpatient records.      Problem List / ED Course:  Near syncope, anemia   Disposition:  After consideration of the diagnostic results and the patients response to treatment, I feel that the patent would benefit from admission.       Final diagnoses:  Near syncope  Anemia, unspecified type    ED Discharge Orders     None          Laurice Maude BROCKS, MD 02/21/24 [phone]

## 2024-02-22 ENCOUNTER — Observation Stay (HOSPITAL_BASED_OUTPATIENT_CLINIC_OR_DEPARTMENT_OTHER)

## 2024-02-22 DIAGNOSIS — R55 Syncope and collapse: Secondary | ICD-10-CM

## 2024-02-22 DIAGNOSIS — I951 Orthostatic hypotension: Secondary | ICD-10-CM | POA: Insufficient documentation

## 2024-02-22 LAB — ECHOCARDIOGRAM COMPLETE
Area-P 1/2: 3.1 cm2
Height: 64 in
S' Lateral: 2 cm
Weight: 1824 [oz_av]

## 2024-02-22 LAB — CBC WITH DIFFERENTIAL/PLATELET
Abs Immature Granulocytes: 0.01 10*3/uL (ref 0.00–0.07)
Abs Immature Granulocytes: 0.01 10*3/uL (ref 0.00–0.07)
Basophils Absolute: 0 10*3/uL (ref 0.0–0.1)
Basophils Absolute: 0 10*3/uL (ref 0.0–0.1)
Basophils Relative: 0 %
Basophils Relative: 0 %
Eosinophils Absolute: 0 10*3/uL (ref 0.0–0.5)
Eosinophils Absolute: 0 10*3/uL (ref 0.0–0.5)
Eosinophils Relative: 1 %
Eosinophils Relative: 1 %
HCT: 29.9 % — ABNORMAL LOW (ref 36.0–46.0)
HCT: 28.4 % — ABNORMAL LOW (ref 36.0–46.0)
Hemoglobin: 10.6 g/dL — ABNORMAL LOW (ref 12.0–15.0)
Hemoglobin: 10 g/dL — ABNORMAL LOW (ref 12.0–15.0)
Immature Granulocytes: 0 %
Immature Granulocytes: 0 %
Lymphocytes Relative: 32 %
Lymphocytes Relative: 43 %
Lymphs Abs: 1 10*3/uL (ref 0.7–4.0)
Lymphs Abs: 1.3 10*3/uL (ref 0.7–4.0)
MCH: 40.2 pg — ABNORMAL HIGH (ref 26.0–34.0)
MCH: 40.2 pg — ABNORMAL HIGH (ref 26.0–34.0)
MCHC: 35.5 g/dL (ref 30.0–36.0)
MCHC: 35.2 g/dL (ref 30.0–36.0)
MCV: 113.3 fL — ABNORMAL HIGH (ref 80.0–100.0)
MCV: 114.1 fL — ABNORMAL HIGH (ref 80.0–100.0)
Monocytes Absolute: 0.2 10*3/uL (ref 0.1–1.0)
Monocytes Absolute: 0.2 10*3/uL (ref 0.1–1.0)
Monocytes Relative: 7 %
Monocytes Relative: 8 %
Neutro Abs: 1.8 10*3/uL (ref 1.7–7.7)
Neutro Abs: 1.4 10*3/uL — ABNORMAL LOW (ref 1.7–7.7)
Neutrophils Relative %: 60 %
Neutrophils Relative %: 48 %
Platelets: 143 10*3/uL — ABNORMAL LOW (ref 150–400)
Platelets: 127 10*3/uL — ABNORMAL LOW (ref 150–400)
RBC: 2.64 MIL/uL — ABNORMAL LOW (ref 3.87–5.11)
RBC: 2.49 MIL/uL — ABNORMAL LOW (ref 3.87–5.11)
RDW: 14.4 % (ref 11.5–15.5)
RDW: 14.6 % (ref 11.5–15.5)
WBC: 3.1 10*3/uL — ABNORMAL LOW (ref 4.0–10.5)
WBC: 2.9 10*3/uL — ABNORMAL LOW (ref 4.0–10.5)
nRBC: 0 % (ref 0.0–0.2)
nRBC: 0.7 % — ABNORMAL HIGH (ref 0.0–0.2)

## 2024-02-22 LAB — GLUCOSE, CAPILLARY
Glucose-Capillary: 78 mg/dL (ref 70–99)
Glucose-Capillary: 198 mg/dL — ABNORMAL HIGH (ref 70–99)

## 2024-02-22 LAB — BASIC METABOLIC PANEL WITH GFR
Anion gap: 9 (ref 5–15)
BUN: 23 mg/dL (ref 8–23)
CO2: 19 mmol/L — ABNORMAL LOW (ref 22–32)
Calcium: 8.8 mg/dL — ABNORMAL LOW (ref 8.9–10.3)
Chloride: 113 mmol/L — ABNORMAL HIGH (ref 98–111)
Creatinine, Ser: 1.4 mg/dL — ABNORMAL HIGH (ref 0.44–1.00)
GFR, Estimated: 38 mL/min — ABNORMAL LOW (ref >60)
Glucose, Bld: 85 mg/dL (ref 70–99)
Potassium: 3.2 mmol/L — ABNORMAL LOW (ref 3.5–5.1)
Sodium: 141 mmol/L (ref 135–145)

## 2024-02-22 LAB — HEMOGLOBIN A1C
Hgb A1c MFr Bld: 11.9 % — ABNORMAL HIGH (ref 4.8–5.6)
Mean Plasma Glucose: 294.83 mg/dL

## 2024-02-22 MED ORDER — SODIUM CHLORIDE 0.9 % IV BOLUS
1000.0000 mL | Freq: Once | INTRAVENOUS | Status: AC
  Administered 2024-02-22: 1000 mL via INTRAVENOUS

## 2024-02-22 MED ORDER — LISINOPRIL 20 MG PO TABS: 20.0000 mg | ORAL_TABLET | Freq: Every day | ORAL | 0 refills | Status: DC

## 2024-02-22 MED ORDER — POTASSIUM CHLORIDE CRYS ER 20 MEQ PO TBCR
40.0000 meq | EXTENDED_RELEASE_TABLET | ORAL | Status: AC
  Administered 2024-02-22 (×2): 40 meq via ORAL
  Filled 2024-02-22 (×2): qty 2

## 2024-02-22 NOTE — Care Management Obs Status (Signed)
 MEDICARE OBSERVATION STATUS NOTIFICATION   Patient Details  Name: Brandi Mccormick MRN: [id number] Date of Birth: February 07, 1941   Medicare Observation Status Notification Given:  Yes    Sheri ONEIDA Sharps, LCSW 02/22/2024, 1:52 PM

## 2024-02-22 NOTE — Evaluation (Signed)
 Physical Therapy One Time Evaluation Patient Details Name: Brandi Mccormick MRN: [id number] DOB: [birth year] Today's Date: 02/22/2024  History of Present Illness  Patient is a 83 year old female who presented on 6/21 after passing out while resting after bringing in groceries. Patient was admitted with acute on chronic anemia, HTN, and near syncope.   PMH: CVA, CKD III, DM II, hyperlipidemia.  Clinical Impression  Patient evaluated by Physical Therapy with no further acute PT needs identified. All education has been completed and the patient has no further questions. Pt able to use bathroom independently in room and ambulated 400 ft in hallway without assistive device.  Pt denies any symptoms with activity during session.  Pt plans to return home with spouse upon d/c and has no f/u PT or DME needs at this time. PT is signing off. Thank you for this referral.           If plan is discharge home, recommend the following:     Can travel by private vehicle        Equipment Recommendations None recommended by PT  Recommendations for Other Services       Functional Status Assessment Patient has not had a recent decline in their functional status     Precautions / Restrictions Precautions Precautions: Fall      Mobility  Bed Mobility               General bed mobility comments: pt in recliner    Transfers Overall transfer level: Needs assistance Equipment used: None Transfers: Sit to/from Stand Sit to Stand: Supervision           General transfer comment: supervision for safety due to low BP with OT earlier (however pt now almost finished with fluid bolus and currently wearing TED hose)    Ambulation/Gait Ambulation/Gait assistance: Contact guard assist, Supervision Gait Distance (Feet): 400 Feet Assistive device: IV Pole, None Gait Pattern/deviations: Step-through pattern, Decreased stride length       General Gait Details: did not require UE support; no  overt LOB observed; pt denies any symptoms  Stairs            Wheelchair Mobility     Tilt Bed    Modified Rankin (Stroke Patients Only)       Balance Overall balance assessment: No apparent balance deficits (not formally assessed)                                           Pertinent Vitals/Pain Pain Assessment Pain Assessment: No/denies pain    Home Living Family/patient expects to be discharged to:: Private residence Living Arrangements: Spouse/significant other Available Help at Discharge: Family;Available 24 hours/day Type of Home: House Home Access: Stairs to enter Entrance Stairs-Rails: None Entrance Stairs-Number of Steps: 2   Home Layout: One level Home Equipment: Hand held shower head      Prior Function Prior Level of Function : Independent/Modified Independent                     Extremity/Trunk Assessment        Lower Extremity Assessment Lower Extremity Assessment: Overall WFL for tasks assessed       Communication   Communication Communication: No apparent difficulties    Cognition Arousal: Alert Behavior During Therapy: WFL for tasks assessed/performed   PT - Cognitive impairments: No apparent impairments  Cueing Cueing Techniques: Verbal cues     General Comments      Exercises     Assessment/Plan    PT Assessment Patient does not need any further PT services  PT Problem List         PT Treatment Interventions      PT Goals (Current goals can be found in the Care Plan section)  Acute Rehab PT Goals PT Goal Formulation: All assessment and education complete, DC therapy    Frequency       Co-evaluation               AM-PAC PT 6 Clicks Mobility  Outcome Measure Help needed turning from your back to your side while in a flat bed without using bedrails?: None Help needed moving from lying on your back to sitting on the side of a flat  bed without using bedrails?: None Help needed moving to and from a bed to a chair (including a wheelchair)?: None Help needed standing up from a chair using your arms (e.g., wheelchair or bedside chair)?: None Help needed to walk in hospital room?: A Little Help needed climbing 3-5 steps with a railing? : A Little 6 Click Score: 22    End of Session Equipment Utilized During Treatment: Gait belt Activity Tolerance: Patient tolerated treatment well Patient left: in chair;with call bell/phone within reach   PT Visit Diagnosis: Difficulty in walking, not elsewhere classified (R26.2)    Time: 8481-8468 PT Time Calculation (min) (ACUTE ONLY): 13 min   Charges:   PT Evaluation $PT Eval Low Complexity: 1 Low   PT General Charges $$ ACUTE PT VISIT: 1 Visit        Tari KLEIN, DPT Physical Therapist Acute Rehabilitation Services Office: [phone]   Tari CROME [place] 02/22/2024, 3:41 PM

## 2024-02-22 NOTE — Progress Notes (Signed)
 TED hose on patient (small)

## 2024-02-22 NOTE — Plan of Care (Signed)

## 2024-02-22 NOTE — Discharge Summary (Addendum)
 Physician Discharge Summary  Brandi Mccormick FMW:[id number] DOB: [birth year] DOA: 02/21/2024  PCP: Antonio Cyndee [name], DO  Admit date: 02/21/2024 Discharge date: 02/22/2024    Admitted From: Home Disposition: Home  Recommendations for Outpatient Follow-up:  Follow up with PCP in 1-2 weeks Please obtain BMP/CBC in one week Please follow up with your PCP on the following pending results: Unresulted Labs (From admission, onward)     Start     Ordered   02/22/24 0924  CBC with Differential/Platelet  5A & 5P,   R     Question:  Specimen collection method  Answer:  Lab=Lab collect   02/22/24 0923   02/21/24 1643  Occult blood card to lab, stool  Once,   R        02/21/24 1642              Home Health: None Equipment/Devices: None  Discharge Condition: Stable CODE STATUS: Full code Diet recommendation: Cardiac   Due to brief hospitalization, I have copied HPI and ED course. HPI: Brandi Mccormick is a 83 y.o. female with medical history significant of hypertension, type 2 diabetes mellitus,-CAD, hyperlipidemia, GERD who was brought into the ED by her husband due to an episode of passing out.  History was obtained from the patient and supported by the husband who is at the bedside.  According to the history, patient returned home from grocery store, after walking into the house from the car porch, when she sat down in the chair, husband noted that she suddenly slumped backward, eyes rolled back.  He thought that she was having a stroke.  She never closed her eyes.  This episode lasted for 2 to 3 seconds according to him.  He thinks that she lost consciousness for those few seconds.  The patient herself does not recall the events.  According to patient, she did not have any prodromal symptoms prior to this episode and she was fully alert and oriented after this episode as well so there was no postictal confusion.  No focal weakness.  For further workup, she was brought into the  emergency department.  Patient endorses being very weak for the last 3 weeks to the point that she is now using the support of the walls to walk even inside the house.  According to husband, this weakness has been ongoing for about 3 weeks.  Chart reviewed, it sounds like patient saw GI just 4 days ago for diarrhea and weight loss.  According to the notes, she attributed that to poor p.o. intake as well as diarrhea.  Her last colonoscopy was done in 2018 which showed 3 tubular adenomas but patient had declined to repeat colonoscopy.  She has a history of CVA and she is on Plavix  chronically.  She was also found to have anemia as well as positive FOBT at PCPs office about 11 days ago however patient denies any melena, hematochezia or hematemesis.   ED Course: Upon arrival, she was hemodynamically stable.  Mild hyperglycemia on BMP and creatinine 1.5 which is her baseline.  Troponins negative, no acute ST-T wave changes.  Lactic acid normal.  FOBT negative in the ED.  Hemoglobin however has declined from 12.211 days ago to 10.2 today.  She has chronic leukopenia.  She was given Protonix  in the ED and hospitalist were called for admission.  Subjective: Seen and examined early morning, patient had no complaint at all.  No dizziness.  Brief/Interim Summary: Patient was mainly hospitalized for syncope.  Details below.  Near syncope/?  Syncope: It is unclear if this was an episode of syncope versus near syncope.  It could be any of those and dehydration leading to orthostatic hypotension was suspected.  Patient was observed on telemetry overnight, no arrhythmia noted.  Transthoracic echo was done which showed normal ejection fraction with no valvular heart disease.  Grade 1 diastolic dysfunction which is known.  No acute changes.  No EKG changes.  She was found to have orthostatic hypotension.  She was hydrated and orthostatic vitals were rechecked after hydration and she was not orthostatic positive anymore after  that indicating this was likely orthostatic hypotension causing near syncope or syncope episode.   Acute on chronic anemia: Suspected GI bleed as the source of anemia and anemia is the possible contributing source of syncope as patient's hemoglobin was 12.2 just 12 days ago and she presented with 10.2.  However her hemoglobin remained stable, FOBT was ordered but was never collected.  Patient denied any recent hematochezia or melena.  Patient recently followed with GI for the same issue and colonoscopy was being considered.  I recommend she follows up with GI and proceed with the recommendations.   Essential hypertension: Blood pressure was within normal range upon arrival however due to risk of hypotension and suspicion of orthostatic hypotension we held her all 3 PTA antihypertensives.  After discussing with patient, she disclosed that she only drinks 16 ounces of water a day.  She was advised to hydrate herself and drink 64 ounces a day.  Due to further risk of orthostatic hypotension secondary to her poor p.o. intake, I have discontinued her amlodipine , reduced her lisinopril  from 40 mg PTA to 20 mg and continued current beta-blocker.   History of CVA: Apparently she is not taking Plavix  anymore.   CKD stage IIIb: Patient's baseline creatinine appears to be around 1.5-1.6.  Currently she is at baseline.   Type 2 diabetes mellitus: She is on Jardiance , glipizide  PTA.  Resume home medications.   Hyperlipidemia: Resume statin.   Generalized weakness: Seen by PT OT, no needs identified.  She was cleared to go home.  Discharge plan was discussed with patient and/or family member and they verbalized understanding and agreed with it.  Discharge Diagnoses:  Principal Problem:   Near syncope Active Problems:   H/O hyperthyroidism   Protein-calorie malnutrition (HCC)   Orthostatic hypotension    Discharge Instructions   Allergies as of 02/22/2024       Reactions   Penicillins Nausea And  Vomiting   Has patient had a PCN reaction causing immediate rash, facial/tongue/throat swelling, SOB or lightheadedness with hypotension: no Has patient had a PCN reaction causing severe rash involving mucus membranes or skin necrosis: no Has patient had a PCN reaction that required hospitalization: no Has patient had a PCN reaction occurring within the last 10 years: no If all of the above answers are NO, then may proceed with Cephalosporin use.        Medication List     STOP taking these medications    amLODipine  5 MG tablet Commonly known as: NORVASC    clopidogrel  75 MG tablet Commonly known as: PLAVIX        TAKE these medications    atorvastatin  80 MG tablet Commonly known as: LIPITOR Take 1 tablet by mouth once daily   empagliflozin  10 MG Tabs tablet Commonly known as: Jardiance  Take 1 tablet (10 mg total) by mouth daily before breakfast.   ezetimibe  10 MG tablet Commonly  known as: ZETIA  Take 1 tablet (10 mg total) by mouth daily.   glipiZIDE  5 MG 24 hr tablet Commonly known as: GLUCOTROL  XL Take 2 tablets (10 mg total) by mouth daily.   hydroxyurea  500 MG capsule Commonly known as: HYDREA  Take 1 capsule (500 mg total) by mouth daily. May take with food to minimize GI side effects.   lisinopril  20 MG tablet Commonly known as: ZESTRIL  Take 1 tablet (20 mg total) by mouth daily. What changed:  medication strength how much to take   metoprolol  succinate 25 MG 24 hr tablet Commonly known as: TOPROL -XL TAKE 1 TABLET BY MOUTH ONCE DAILY WITH MEAL   pantoprazole  40 MG tablet Commonly known as: PROTONIX  Take 1 tablet (40 mg total) by mouth daily.   polyethylene glycol 17 g packet Commonly known as: MiraLax  Take 17 g by mouth daily as needed.   True Metrix Blood Glucose Test test strip Generic drug: glucose blood Use as instructed 2x a day   True Metrix Meter w/Device Kit Use as advised        Follow-up Information     Antonio Cyndee Rockers R,  DO Follow up in 1 week(s).   Specialty: Family Medicine Contact information: [phone] [place] [address] [phone]                Allergies  Allergen Reactions   Penicillins Nausea And Vomiting    Has patient had a PCN reaction causing immediate rash, facial/tongue/throat swelling, SOB or lightheadedness with hypotension: no Has patient had a PCN reaction causing severe rash involving mucus membranes or skin necrosis: no Has patient had a PCN reaction that required hospitalization: no Has patient had a PCN reaction occurring within the last 10 years: no If all of the above answers are NO, then may proceed with Cephalosporin use.     Consultations: None   Procedures/Studies: ECHOCARDIOGRAM COMPLETE Result Date: 02/22/2024    ECHOCARDIOGRAM REPORT   Patient Name:   Orlinda G Woolsey Date of Exam: 02/22/2024 Medical Rec #:  [id number]        Height:       64.0 in Accession #:    [id number]       Weight:       114.0 lb Date of Birth:  [birth year]        BSA:          1.541 m Patient Age:    83 years         BP:           127/71 mmHg Patient Gender: F                HR:           67 bpm. Exam Location:  Inpatient Procedure: 2D Echo, Cardiac Doppler and Color Doppler (Both Spectral and Color            Flow Doppler were utilized during procedure). Indications:    Syncope R55  History:        Patient has prior history of Echocardiogram examinations, most                 recent 10/06/2020.  Sonographer:    Nolon Gosling RDCS Referring Phys: [id number] Jani Ploeger IMPRESSIONS  1. Left ventricular ejection fraction, by estimation, is 60 to 65%. The left ventricle has normal function. The left ventricle has no regional wall motion abnormalities. Left ventricular diastolic parameters are consistent with Grade I diastolic dysfunction (impaired  relaxation).  2. Right ventricular systolic function is normal. The right ventricular size is normal. There is normal pulmonary artery systolic  pressure. The estimated right ventricular systolic pressure is 24.3 mmHg.  3. Left atrial size was mildly dilated.  4. The mitral valve is normal in structure. No evidence of mitral valve regurgitation. No evidence of mitral stenosis.  5. The aortic valve is tricuspid. Aortic valve regurgitation is not visualized. No aortic stenosis is present.  6. The inferior vena cava is normal in size with greater than 50% respiratory variability, suggesting right atrial pressure of 3 mmHg. FINDINGS  Left Ventricle: Left ventricular ejection fraction, by estimation, is 60 to 65%. The left ventricle has normal function. The left ventricle has no regional wall motion abnormalities. The left ventricular internal cavity size was normal in size. There is  no left ventricular hypertrophy. Left ventricular diastolic parameters are consistent with Grade I diastolic dysfunction (impaired relaxation). Right Ventricle: The right ventricular size is normal. No increase in right ventricular wall thickness. Right ventricular systolic function is normal. There is normal pulmonary artery systolic pressure. The tricuspid regurgitant velocity is 2.31 m/s, and  with an assumed right atrial pressure of 3 mmHg, the estimated right ventricular systolic pressure is 24.3 mmHg. Left Atrium: Left atrial size was mildly dilated. Right Atrium: Right atrial size was normal in size. Pericardium: There is no evidence of pericardial effusion. Mitral Valve: The mitral valve is normal in structure. No evidence of mitral valve regurgitation. No evidence of mitral valve stenosis. Tricuspid Valve: The tricuspid valve is normal in structure. Tricuspid valve regurgitation is mild . No evidence of tricuspid stenosis. Aortic Valve: The aortic valve is tricuspid. Aortic valve regurgitation is not visualized. No aortic stenosis is present. Pulmonic Valve: The pulmonic valve was normal in structure. Pulmonic valve regurgitation is not visualized. No evidence of pulmonic  stenosis. Aorta: The aortic root is normal in size and structure. Venous: The inferior vena cava is normal in size with greater than 50% respiratory variability, suggesting right atrial pressure of 3 mmHg. IAS/Shunts: No atrial level shunt detected by color flow Doppler.  LEFT VENTRICLE PLAX 2D LVIDd:         3.40 cm   Diastology LVIDs:         2.00 cm   LV e' medial:    6.74 cm/s LV PW:         1.00 cm   LV E/e' medial:  13.5 LV IVS:        1.10 cm   LV e' lateral:   9.14 cm/s LVOT diam:     1.90 cm   LV E/e' lateral: 10.0 LV SV:         62 LV SV Index:   40 LVOT Area:     2.84 cm  RIGHT VENTRICLE             IVC RV S prime:     12.00 cm/s  IVC diam: 1.10 cm TAPSE (M-mode): 2.9 cm LEFT ATRIUM             Index        RIGHT ATRIUM           Index LA diam:        3.40 cm 2.21 cm/m   RA Area:     14.10 cm LA Vol (A2C):   52.3 ml 33.95 ml/m  RA Volume:   36.20 ml  23.50 ml/m LA Vol (A4C):   32.5 ml 21.10 ml/m  LA Biplane Vol: 41.5 ml 26.94 ml/m  AORTIC VALVE LVOT Vmax:   107.00 cm/s LVOT Vmean:  70.400 cm/s LVOT VTI:    0.219 m  AORTA Ao Root diam: 2.80 cm Ao Asc diam:  3.30 cm MITRAL VALVE                TRICUSPID VALVE MV Area (PHT): 3.10 cm     TR Peak grad:   21.3 mmHg MV E velocity: 91.30 cm/s   TR Vmax:        231.00 cm/s MV A velocity: 102.00 cm/s MV E/A ratio:  0.90         SHUNTS                             Systemic VTI:  0.22 m                             Systemic Diam: 1.90 cm Oneil Parchment MD Electronically signed by Oneil Parchment MD Signature Date/Time: 02/22/2024/11:42:35 AM    Final    DG Chest Port 1 View Result Date: 02/21/2024 CLINICAL DATA:  Dizziness and near syncopal episode. EXAM: PORTABLE CHEST 1 VIEW COMPARISON:  10/06/2020. FINDINGS: The heart is mildly enlarged and mediastinal contours are within normal limits. There is atherosclerotic calcification of the aorta. No consolidation, effusion, or pneumothorax is seen. No acute osseous abnormality. IMPRESSION: No active disease.  Electronically Signed   By: Leita Birmingham M.D.   On: 02/21/2024 14:52     Discharge Exam: Vitals:   02/22/24 1616 02/22/24 1618  BP: 119/63 120/66  Pulse: 75 66  Resp: 18 20  Temp: 97.7 F (36.5 C) 98 F (36.7 C)  SpO2: 100% 100%   Vitals:   02/22/24 1216 02/22/24 1612 02/22/24 1616 02/22/24 1618  BP: (!) 149/70 127/60 119/63 120/66  Pulse: 63 69 75 66  Resp: 18 18 18 20   Temp: 98 F (36.7 C) 97.6 F (36.4 C) 97.7 F (36.5 C) 98 F (36.7 C)  TempSrc:      SpO2: 100% 100% 100% 100%  Weight:      Height:        General: Pt is alert, awake, not in acute distress Cardiovascular: RRR, S1/S2 +, no rubs, no gallops Respiratory: CTA bilaterally, no wheezing, no rhonchi Abdominal: Soft, NT, ND, bowel sounds + Extremities: no edema, no cyanosis    The results of significant diagnostics from this hospitalization (including imaging, microbiology, ancillary and laboratory) are listed below for reference.     Microbiology: No results found for this or any previous visit (from the past 240 hours).   Labs: BNP (last 3 results) No results for input(s): BNP in the last 8760 hours. Basic Metabolic Panel: Recent Labs  Lab 02/21/24 1430 02/21/24 1439 02/22/24 0619  NA 138 141 141  K 4.3 4.3 3.2*  CL 110 109 113*  CO2 21*  --  19*  GLUCOSE 243* 248* 85  BUN 27* 35* 23  CREATININE 1.54* 2.50* 1.40*  CALCIUM  8.7*  --  8.8*   Liver Function Tests: Recent Labs  Lab 02/21/24 1430  AST 28  ALT 25  ALKPHOS 49  BILITOT 1.2  PROT 6.5  ALBUMIN 3.5   No results for input(s): LIPASE, AMYLASE in the last 168 hours. No results for input(s): AMMONIA in the last 168 hours. CBC: Recent Labs  Lab 02/21/24 1430 02/21/24 1439  02/22/24 1008  WBC 2.6*  --  3.1*  NEUTROABS 1.9  --  1.8  HGB 10.2* 9.5* 10.6*  HCT 29.1* 28.0* 29.9*  MCV 115.0*  --  113.3*  PLT 131*  --  143*   Cardiac Enzymes: No results for input(s): CKTOTAL, CKMB, CKMBINDEX, TROPONINI in  the last 168 hours. BNP: Invalid input(s): POCBNP CBG: Recent Labs  Lab 02/21/24 1758 02/21/24 2034 02/22/24 0732 02/22/24 1213  GLUCAP 131* 83 78 198*   D-Dimer No results for input(s): DDIMER in the last 72 hours. Hgb A1c Recent Labs    02/21/24 1837  HGBA1C 11.9*   Lipid Profile No results for input(s): CHOL, HDL, LDLCALC, TRIG, CHOLHDL, LDLDIRECT in the last 72 hours. Thyroid  function studies No results for input(s): TSH, T4TOTAL, T3FREE, THYROIDAB in the last 72 hours.  Invalid input(s): FREET3 Anemia work up Recent Labs    02/21/24 1837  FERRITIN 150  TIBC 241*  IRON 74  RETICCTPCT 1.4   Urinalysis    Component Value Date/Time   COLORURINE STRAW (A) 02/21/2024 1540   APPEARANCEUR CLEAR 02/21/2024 1540   LABSPEC 1.013 02/21/2024 1540   PHURINE 5.0 02/21/2024 1540   GLUCOSEU >=500 (A) 02/21/2024 1540   HGBUR NEGATIVE 02/21/2024 1540   BILIRUBINUR NEGATIVE 02/21/2024 1540   BILIRUBINUR neg 02/10/2024 1027   KETONESUR NEGATIVE 02/21/2024 1540   PROTEINUR NEGATIVE 02/21/2024 1540   UROBILINOGEN 0.2 02/10/2024 1027   UROBILINOGEN 1.0 10/23/2013 2011   NITRITE NEGATIVE 02/21/2024 1540   LEUKOCYTESUR NEGATIVE 02/21/2024 1540   Sepsis Labs Recent Labs  Lab 02/21/24 1430 02/22/24 1008  WBC 2.6* 3.1*   Microbiology No results found for this or any previous visit (from the past 240 hours).  FURTHER DISCHARGE INSTRUCTIONS:   Get Medicines reviewed and adjusted: Please take all your medications with you for your next visit with your Primary MD   Laboratory/radiological data: Please request your Primary MD to go over all hospital tests and procedure/radiological results at the follow up, please ask your Primary MD to get all Hospital records sent to his/her office.   In some cases, they will be blood work, cultures and biopsy results pending at the time of your discharge. Please request that your primary care M.D. goes through  all the records of your hospital data and follows up on these results.   Also Note the following: If you experience worsening of your admission symptoms, develop shortness of breath, life threatening emergency, suicidal or homicidal thoughts you must seek medical attention immediately by calling 911 or calling your MD immediately  if symptoms less severe.   You must read complete instructions/literature along with all the possible adverse reactions/side effects for all the Medicines you take and that have been prescribed to you. Take any new Medicines after you have completely understood and accpet all the possible adverse reactions/side effects.    patient was instructed, not to drive, operate heavy machinery, perform activities at heights, swimming or participation in water activities or provide baby-sitting services while on Pain, Sleep and Anxiety Medications; until their outpatient Physician has advised to do so again. Also recommended to not to take more than prescribed Pain, Sleep and Anxiety Medications.  It is not advisable to combine anxiety, sleep and pain medications without talking with your primary care provider.     Wear Seat belts while driving.   Please note: You were cared for by a hospitalist during your hospital stay. Once you are discharged, your primary care physician will handle  any further medical issues. Please note that NO REFILLS for any discharge medications will be authorized once you are discharged, as it is imperative that you return to your primary care physician (or establish a relationship with a primary care physician if you do not have one) for your post hospital discharge needs so that they can reassess your need for medications and monitor your lab values  Time coordinating discharge: Over 30 minutes  SIGNED:   Fredia Skeeter, MD  [facility] 02/22/2024, 4:40 PM *Please note that this is a verbal dictation therefore any spelling or grammatical errors  are due to the Dragon [employer] One system interpretation. If 7PM-7AM, please contact night-coverage [email or web address]

## 2024-02-22 NOTE — Progress Notes (Signed)
  Echocardiogram 2D Echocardiogram has been performed.  Tinnie FORBES Gosling RDCS 02/22/2024, 11:20 AM

## 2024-02-22 NOTE — Evaluation (Signed)
 Occupational Therapy Evaluation Patient Details Name: Brandi Mccormick MRN: [id number] DOB: 1940/10/20 Today's Date: 02/22/2024   History of Present Illness   Patient is a 83 year old female who presented on 6/21 after passing out while resting after bringing in groceries. Patient was admitted with acute on chronic anemia, HTN, and near syncope.   PMH: CVA, CKD III, DM II, hyperlipidemia.     Clinical Impressions Patient is a 83 year old female who presented for above. Patient was independent at home living with husband. Other than Bps noted below patient is supervision for ADLs for safety at this time. No physical A needed. Patient endorsed not needing further OT at this time. Patient asked about tremors in RUE with education on using weights. OT to sign off.  Blood pressures: 141/10mmhg at rest in bed,  101/72 mmhg sitting EOB 108/58 mmhg standing EOB.  127/65 mmhg at end of therapy session      If plan is discharge home, recommend the following:   Assistance with cooking/housework;Direct supervision/assist for medications management;Assist for transportation;Help with stairs or ramp for entrance;Direct supervision/assist for financial management     Functional Status Assessment   Patient has had a recent decline in their functional status and demonstrates the ability to make significant improvements in function in a reasonable and predictable amount of time.     Equipment Recommendations   None recommended by OT      Precautions/Restrictions   Precautions Precautions: Fall Precaution/Restrictions Comments: orthostatic Restrictions Weight Bearing Restrictions Per Provider Order: No     Mobility Bed Mobility Overal bed mobility: Needs Assistance Bed Mobility: Supine to Sit     Supine to sit: Supervision                  Balance Overall balance assessment: Mild deficits observed, not formally tested       ADL either performed or assessed with  clinical judgement   ADL Overall ADL's : Needs assistance/impaired Eating/Feeding: Modified independent;Sitting   Grooming: Standing;Oral care;Wash/dry face;Supervision/safety   Upper Body Bathing: Sitting;Set up   Lower Body Bathing: Sitting/lateral leans;Set up;Supervison/ safety   Upper Body Dressing : Sitting;Set up;Supervision/safety   Lower Body Dressing: Sitting/lateral leans;Supervision/safety   Toilet Transfer: Electronics engineer Details (indicate cue type and reason): patient noted to reach out to door frame and funiture with functional mobility. denies using AD at home. Toileting- Clothing Manipulation and Hygiene: Sit to/from stand;Supervision/safety         General ADL Comments: patient noted to have tremors in RUE with patient reporting this had been since CVA. patient was educated on using weighted wrist cuffs of gloves to reduce tremors during ADLs. patient verbalized understanding and reported that she would look into it.     Vision   Vision Assessment?: No apparent visual deficits            Pertinent Vitals/Pain Pain Assessment Pain Assessment: No/denies pain     Extremity/Trunk Assessment Upper Extremity Assessment Upper Extremity Assessment: Overall WFL for tasks assessed   Lower Extremity Assessment Lower Extremity Assessment: Defer to PT evaluation   Cervical / Trunk Assessment Cervical / Trunk Assessment: Kyphotic   Communication     Cognition Arousal: Alert Behavior During Therapy: WFL for tasks assessed/performed Cognition: No apparent impairments                          Home Living Family/patient expects to be discharged to:: Private residence Living  Arrangements: Spouse/significant other Available Help at Discharge: Family;Available 24 hours/day Type of Home: House Home Access: Stairs to enter [employer] of Steps: 2 Entrance Stairs-Rails: None Home Layout: One level      Bathroom Shower/Tub: Tub/shower unit         Home Equipment: Hand held shower head          Prior Functioning/Environment Prior Level of Function : Independent/Modified Independent                    OT Problem List: Decreased safety awareness;Decreased knowledge of precautions;Cardiopulmonary status limiting activity   OT Treatment/Interventions: Therapeutic activities;Patient/family education;Self-care/ADL training      OT Goals(Current goals can be found in the care plan section)   Acute Rehab OT Goals Patient Stated Goal: to go home OT Goal Formulation: All assessment and education complete, DC therapy   OT Frequency:          AM-PAC OT 6 Clicks Daily Activity     Outcome Measure Help from another person eating meals?: None Help from another person taking care of personal grooming?: None Help from another person toileting, which includes using toliet, bedpan, or urinal?: A Little Help from another person bathing (including washing, rinsing, drying)?: A Little Help from another person to put on and taking off regular upper body clothing?: None Help from another person to put on and taking off regular lower body clothing?: A Little 6 Click Score: 21   End of Session Equipment Utilized During Treatment: Gait belt Nurse Communication: Mobility status;Other (comment) (BP during session)  Activity Tolerance: Patient tolerated treatment well Patient left: in chair;with call bell/phone within reach;with chair alarm set  OT Visit Diagnosis: Unsteadiness on feet (R26.81);Other abnormalities of gait and mobility (R26.89);Muscle weakness (generalized) (M62.81)                Time: 9167-9144 OT Time Calculation (min): 23 min Charges:  OT General Charges $OT Visit: 1 Visit OT Evaluation $OT Eval Low Complexity: 1 Low OT Treatments $Self Care/Home Management : 8-22 mins  Geofm LEYLAND, MS Acute Rehabilitation Department Office# [phone]   Geofm CHRISTELLA Dance 02/22/2024, 9:03 AM

## 2024-02-22 NOTE — Progress Notes (Signed)
 Prior-To-Admission Oral Chemotherapy for Treatment of Oncologic Disease   Order noted from Dr. Vernon to continue prior-to-admission oral chemotherapy regimen of Hydroxyurea  500 mg po qday.  Procedure Per Pharmacy & Therapeutics Committee Policy: Orders for continuation of home oral chemotherapy for treatment of an oncologic disease will be held unless approved by an oncologist during current admission.    For patients receiving oncology care at [facility] [place], inpatient pharmacist contacts patient's oncologist during regular office hours to review. If earlier review is medically necessary, attending physician consults [facility] on-call oncologist   For patients receiving oncology care outside of [facility], attending physician consults patient's oncologist to review. If this oncologist or their coverage cannot be reached, attending physician consults [facility] on-call oncologist   Oral chemotherapy continuation order is on hold pending oncologist review, [facility] oncologist Cloretta MATSU will be notified by inpatient pharmacy during office hours      Eleanor Agent, PharmD, BCPS 02/22/2024, 7:17 AM

## 2024-02-23 ENCOUNTER — Other Ambulatory Visit: Payer: Self-pay | Admitting: Family Medicine

## 2024-02-23 ENCOUNTER — Telehealth: Payer: Self-pay | Admitting: Neurology

## 2024-02-23 ENCOUNTER — Telehealth: Payer: Self-pay

## 2024-02-23 DIAGNOSIS — E1159 Type 2 diabetes mellitus with other circulatory complications: Secondary | ICD-10-CM

## 2024-02-23 MED ORDER — LISINOPRIL 20 MG PO TABS: 20.0000 mg | ORAL_TABLET | Freq: Every day | ORAL | 1 refills | Status: DC

## 2024-02-23 NOTE — Telephone Encounter (Signed)
 Copied from CRM [id number]. Topic: Clinical - Medication Question >> Feb 23, 2024  8:52 AM Brandi Mccormick wrote: Reason for CRM: Patient called in stated , patient went to the emergency room and changed prescription to 20 MG instead of 40 mg, lisinopril  (ZESTRIL ) 20 MG tablet wanted that prescription to be sent in to the pharmacy

## 2024-02-23 NOTE — Transitions of Care (Post Inpatient/ED Visit) (Signed)
 02/23/2024  Name: Brandi Mccormick MRN: [id number] DOB: [birth year]  Today's TOC FU Call Status: Today's TOC FU Call Status:: Successful TOC FU Call Completed TOC FU Call Complete Date: 02/23/24 Patient's Name and Date of Birth confirmed.  Transition Care Management Follow-up Telephone Call Date of Discharge: 02/22/24 Discharge Facility: [facility]) Type of Discharge: Inpatient Admission Primary Inpatient Discharge Diagnosis:: Near syncope How have you been since you were released from the hospital?: Better Any questions or concerns?: No  Items Reviewed: Did you receive and understand the discharge instructions provided?: Yes Medications obtained,verified, and reconciled?: Yes (Medications Reviewed) Any new allergies since your discharge?: No Dietary orders reviewed?: NA Do you have support at home?: Yes People in Home [RPT]: spouse  Medications Reviewed Today: Medications Reviewed Today     Reviewed by Lang Avelina PARAS, CMA (Certified Medical Assistant) on 02/23/24 at 1444  Med List Status: <None>   Medication Order Taking? Sig Documenting Provider Last Dose Status Informant  atorvastatin  (LIPITOR) 80 MG tablet [id number] Yes Take 1 tablet by mouth once daily Antonio Meth, Yvonne R, DO  Active Self, Pharmacy Records  Blood Glucose Monitoring Suppl (TRUE METRIX METER) w/Device PRESSLEY [id number] Yes Use as advised Trixie File, MD  Active Self, Pharmacy Records  empagliflozin  (JARDIANCE ) 10 MG TABS tablet [id number] Yes Take 1 tablet (10 mg total) by mouth daily before breakfast. Antonio Meth, Yvonne R, DO  Active Self, Pharmacy Records  ezetimibe  (ZETIA ) 10 MG tablet [id number] Yes Take 1 tablet (10 mg total) by mouth daily. Antonio Meth [name], DO  Active Self, Pharmacy Records  glipiZIDE  (GLUCOTROL  XL) 5 MG 24 hr tablet [id number] Yes Take 2 tablets (10 mg total) by mouth daily. Cyndi Shaver, PA-C  Active Self, Pharmacy Records  glucose blood (TRUE METRIX BLOOD GLUCOSE  TEST) test strip [id number] Yes Use as instructed 2x a day Trixie File, MD  Active Self, Pharmacy Records  hydroxyurea  (HYDREA ) 500 MG capsule [id number] Yes Take 1 capsule (500 mg total) by mouth daily. May take with food to minimize GI side effects. Cloretta Arley NOVAK, MD  Active Self, Pharmacy Records  lisinopril  (ZESTRIL ) 20 MG tablet [id number] Yes Take 1 tablet (20 mg total) by mouth daily. Antonio Meth, Yvonne R, DO  Active   metoprolol  succinate (TOPROL -XL) 25 MG 24 hr tablet [id number] Yes TAKE 1 TABLET BY MOUTH ONCE DAILY WITH MEAL Lowne Chase, Yvonne R, DO  Active Self, Pharmacy Records  pantoprazole  (PROTONIX ) 40 MG tablet [id number] Yes Take 1 tablet (40 mg total) by mouth daily. Antonio Meth [name], DO  Active Self, Pharmacy Records  polyethylene glycol (MIRALAX ) 17 g packet [id number] Yes Take 17 g by mouth daily as needed. Leigh Elspeth SQUIBB, MD  Active Self, Pharmacy Records            Home Care and Equipment/Supplies: Were Home Health Services Ordered?: NA Any new equipment or medical supplies ordered?: NA  Functional Questionnaire: Do you need assistance with bathing/showering or dressing?: No Do you need assistance with meal preparation?: No Do you need assistance with eating?: No Do you have difficulty maintaining continence: No Do you need assistance with getting out of bed/getting out of a chair/moving?: No Do you have difficulty managing or taking your medications?: No  Follow up appointments reviewed: PCP Follow-up appointment confirmed?: Yes Date of PCP follow-up appointment?: 02/26/24 Follow-up Provider: Antonio Meth, [name], DO Specialist Hospital Follow-up appointment confirmed?: NA Do you need transportation to your follow-up appointment?: No Do you understand care  options if your condition(s) worsen?: Yes-patient verbalized understanding    SIGNATURE Avelina Essex, CMA (AAMA)  [facility] [phone]

## 2024-02-25 ENCOUNTER — Ambulatory Visit: Payer: Medicare HMO | Admitting: Podiatry

## 2024-02-25 ENCOUNTER — Encounter: Payer: Self-pay | Admitting: Podiatry

## 2024-02-25 DIAGNOSIS — E119 Type 2 diabetes mellitus without complications: Secondary | ICD-10-CM

## 2024-02-25 DIAGNOSIS — M79675 Pain in left toe(s): Secondary | ICD-10-CM

## 2024-02-25 DIAGNOSIS — B351 Tinea unguium: Secondary | ICD-10-CM | POA: Diagnosis not present

## 2024-02-25 DIAGNOSIS — M79674 Pain in right toe(s): Secondary | ICD-10-CM | POA: Diagnosis not present

## 2024-02-25 NOTE — Progress Notes (Signed)
 Subjective:  Patient ID: Brandi Mccormick, female    DOB: 1941-03-10,  MRN: [id number]  Brandi Mccormick presents to clinic today for preventative diabetic foot care and painful thick toenails that are difficult to trim. Pain interferes with ambulation. Aggravating factors include wearing enclosed shoe gear. Pain is relieved with periodic professional debridement.  Chief Complaint  Patient presents with   [facility]    RM#17 DFC blood sugar this am was 95. Appointment scheduled with pcp 02/27/2024 for diabetes management.   New problem(s): None.   PCP is Antonio Meth, [place] R, DO.  Allergies  Allergen Reactions   Penicillins Nausea And Vomiting    Has patient had a PCN reaction causing immediate rash, facial/tongue/throat swelling, SOB or lightheadedness with hypotension: no Has patient had a PCN reaction causing severe rash involving mucus membranes or skin necrosis: no Has patient had a PCN reaction that required hospitalization: no Has patient had a PCN reaction occurring within the last 10 years: no If all of the above answers are NO, then may proceed with Cephalosporin use.     Review of Systems: Negative except as noted in the HPI.  Objective: No changes noted in today's physical examination. There were no vitals filed for this visit. Brandi Mccormick is a pleasant 83 y.o. female WD, WN in NAD. AAO x 3.  Vascular Examination: CFT <3 seconds b/l. DP/PT pulses faintly palpable b/l. Skin temperature gradient warm to warm b/l. No pain with calf compression. No ischemia or gangrene. No cyanosis or clubbing noted b/l. No edema noted b/l LE.   Neurological Examination: Sensation grossly intact b/l with 10 gram monofilament. Vibratory sensation intact b/l.   Dermatological Examination: Pedal skin warm and supple b/l.   No open wounds. No interdigital macerations.  Toenails 1-5 b/l thick, discolored, elongated with subungual debris and pain on dorsal palpation.    No corns,  calluses nor porokeratotic lesions noted.  Musculoskeletal Examination: Muscle strength 5/5 to all lower extremity muscle groups bilaterally. No pain, crepitus or joint limitation noted with ROM b/l LE. Hammertoe deformity noted 2-5 b/l. Pes planus deformity noted bilateral LE.SABRA Patient ambulates with cane assistance.  Radiographs: None  Last A1c:      Latest Ref Rng & Units 02/21/2024    6:37 PM 02/10/2024   10:06 AM 11/04/2023    9:04 AM 07/04/2023   12:00 AM 02/27/2023    4:04 PM  Hemoglobin A1C  Hemoglobin-A1c 4.8 - 5.6 % 11.9  12.7  11.2  9.3     9.0      This result is from an external source.    Assessment/Plan: 1. Pain due to onychomycosis of toenails of both feet   2. Controlled type 2 diabetes mellitus without complication, without long-term current use of insulin  [facility])     Consent given for treatment. Patient examined. All patient's and/or POA's questions/concerns addressed on today's visit. Toenails 1-5 debrided in length and girth without incident. Continue foot and shoe inspections daily. Monitor blood glucose per PCP/Endocrinologist's recommendations. Continue soft, supportive shoe gear daily. Report any pedal injuries to medical professional. Call office if there are any questions/concerns. -Patient/POA to call should there be question/concern in the interim.   Return in about 3 months (around 05/27/2024).  Brandi Mccormick, DPM      [facility] [place] LOCATION: [address]  [place], [place] [address]                   Office [phone]   [facility] LOCATION: [address] [place], [place] [address] Office [phone]

## 2024-02-26 ENCOUNTER — Encounter: Payer: Self-pay | Admitting: Family Medicine

## 2024-02-26 ENCOUNTER — Ambulatory Visit: Payer: Self-pay | Admitting: Gastroenterology

## 2024-02-26 ENCOUNTER — Ambulatory Visit (INDEPENDENT_AMBULATORY_CARE_PROVIDER_SITE_OTHER): Payer: Medicare HMO | Admitting: Family Medicine

## 2024-02-26 ENCOUNTER — Ambulatory Visit (INDEPENDENT_AMBULATORY_CARE_PROVIDER_SITE_OTHER)

## 2024-02-26 VITALS — BP 118/68 | HR 74 | Temp 97.8°F | Resp 18 | Ht 64.0 in | Wt 111.8 lb

## 2024-02-26 DIAGNOSIS — Z794 Long term (current) use of insulin: Secondary | ICD-10-CM

## 2024-02-26 DIAGNOSIS — I959 Hypotension, unspecified: Secondary | ICD-10-CM

## 2024-02-26 DIAGNOSIS — R197 Diarrhea, unspecified: Secondary | ICD-10-CM

## 2024-02-26 DIAGNOSIS — E1165 Type 2 diabetes mellitus with hyperglycemia: Secondary | ICD-10-CM

## 2024-02-26 DIAGNOSIS — R195 Other fecal abnormalities: Secondary | ICD-10-CM | POA: Diagnosis not present

## 2024-02-26 LAB — HEMOCCULT SLIDES (X 3 CARDS)
Fecal Occult Blood: NEGATIVE
OCCULT 1: NEGATIVE
OCCULT 2: NEGATIVE
OCCULT 3: NEGATIVE
OCCULT 4: NEGATIVE
OCCULT 5: NEGATIVE

## 2024-02-26 LAB — CBC WITH DIFFERENTIAL/PLATELET
Basophils Absolute: 0 10*3/uL (ref 0.0–0.1)
Basophils Relative: 0.5 % (ref 0.0–3.0)
Eosinophils Absolute: 0 10*3/uL (ref 0.0–0.7)
Eosinophils Relative: 1.1 % (ref 0.0–5.0)
HCT: 30.2 % — ABNORMAL LOW (ref 36.0–46.0)
Hemoglobin: 10.6 g/dL — ABNORMAL LOW (ref 12.0–15.0)
Lymphocytes Relative: 37.4 % (ref 12.0–46.0)
Lymphs Abs: 1 10*3/uL (ref 0.7–4.0)
MCHC: 34.9 g/dL (ref 30.0–36.0)
MCV: 115.4 fl — ABNORMAL HIGH (ref 78.0–100.0)
Monocytes Absolute: 0.2 10*3/uL (ref 0.1–1.0)
Monocytes Relative: 7.3 % (ref 3.0–12.0)
Neutro Abs: 1.5 10*3/uL (ref 1.4–7.7)
Neutrophils Relative %: 53.7 % (ref 43.0–77.0)
Platelets: 186 10*3/uL (ref 150.0–400.0)
RBC: 2.62 Mil/uL — ABNORMAL LOW (ref 3.87–5.11)
RDW: 16.1 % — ABNORMAL HIGH (ref 11.5–15.5)
WBC: 2.7 10*3/uL — ABNORMAL LOW (ref 4.0–10.5)

## 2024-02-26 LAB — BASIC METABOLIC PANEL WITH GFR
BUN: 26 mg/dL — ABNORMAL HIGH (ref 6–23)
CO2: 24 meq/L (ref 19–32)
Calcium: 9 mg/dL (ref 8.4–10.5)
Chloride: 107 meq/L (ref 96–112)
Creatinine, Ser: 1.65 mg/dL — ABNORMAL HIGH (ref 0.40–1.20)
GFR: 28.79 mL/min — ABNORMAL LOW (ref >60.00)
Glucose, Bld: 213 mg/dL — ABNORMAL HIGH (ref 70–99)
Potassium: 3.7 meq/L (ref 3.5–5.1)
Sodium: 140 meq/L (ref 135–145)

## 2024-02-26 MED ORDER — LANTUS SOLOSTAR 100 UNIT/ML ~~LOC~~ SOPN: PEN_INJECTOR | SUBCUTANEOUS | 99 refills | Status: DC

## 2024-02-26 NOTE — Assessment & Plan Note (Signed)
 D/w pt importance of taking meds---- she agreed to start lantus  and has app with new endo in July

## 2024-02-26 NOTE — Progress Notes (Signed)
 *           *                                     Established Patient Office Visit  Subjective   Patient ID: Brandi Mccormick, female    DOB: 15-Jul-1941  Age: 83 y.o. MRN: [id number]  Chief Complaint  Patient presents with   Hypertension   Diabetes   Hyperlipidemia   Follow-up    HPI Discussed the use of AI scribe software for clinical note transcription with the patient, who gave verbal consent to proceed.  History of Present Illness Brandi Mccormick is an 83 year old female with uncontrolled diabetes and gastrointestinal bleeding who presents with episodes of dizziness and near syncope.  She experiences episodes of feeling extremely hot, akin to intense hot flashes, particularly when standing or walking. These episodes have led to near syncope, notably after prolonged walking in a grocery store. She feels better after using the restroom following these episodes.  She has not observed blood in her stool but was informed that testing detected blood. She mailed stool samples for further testing but has not yet received results, having mailed them on a Friday after an unsuccessful attempt on a Thursday.  Her diabetes is poorly controlled, with a recent A1c of 11.9. Blood sugar readings are variable, with a recent morning reading of 84 and an evening reading of 140 after eating a ham biscuit. She was previously hospitalized and received insulin  injections but was not discharged with insulin .  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 She experiences hand shakiness, which she attributes to the stroke.  Due to her symptoms, she fears driving and relies on her husband for transportation and assistance with activities such as taking her grandchild to practice.   Patient Active Problem List   Diagnosis Date Noted   Orthostatic hypotension 02/22/2024   Near syncope 02/21/2024    Myelodysplastic syndrome, unspecified (HCC) 08/13/2021   Vaginal discharge 08/13/2021   Abdominal pressure 08/13/2021   Anemia 02/09/2021   Cerebrovascular accident (CVA) (HCC) 10/06/2020   Preventative health care 05/06/2019   Uncontrolled type 2 diabetes mellitus with hyperglycemia (HCC) 05/06/2019   Dizziness 11/05/2018   DM (diabetes mellitus) type II uncontrolled, periph vascular disorder 09/11/2018   Hyperlipidemia associated with type 2 diabetes mellitus (HCC) 04/02/2018   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   Pneumonia    Hypertension associated with diabetes (HCC) 01/14/2017   Hyperlipidemia 11/15/2016   Polycythemia vera (HCC) 07/01/2014   Protein-calorie malnutrition, severe (HCC) 06/25/2014   Protein-calorie malnutrition (HCC) 06/24/2014   Hypokalemia 06/24/2014   CAP (community acquired pneumonia) 06/24/2014   H/O hyperthyroidism 12/21/2013   Rash 01/24/2013   Past Medical History:  Diagnosis Date   CVA (cerebral vascular accident) (HCC) 10/2020   Diabetes (HCC)    GERD (gastroesophageal reflux disease)    Hyperlipidemia    Hypertension    Hyperthyroidism    Pneumonia    Polycythemia    Shingles    Past Surgical History:  Procedure Laterality Date   CATARACT EXTRACTION Left 2020   COLONOSCOPY     DENTAL SURGERY     IR ANGIO INTRA EXTRACRAN SEL COM CAROTID INNOMINATE BILAT MOD SED  10/09/2020   IR ANGIO VERTEBRAL SEL VERTEBRAL BILAT MOD SED  10/09/2020   Social History  Tobacco Use   Smoking status: Never   Smokeless tobacco: Never  Vaping Use   Vaping status: Never Used  Substance Use Topics   Alcohol use: Yes    Alcohol/week: 0.0 standard drinks of alcohol    Comment: occasional wine cooler   Drug use: No   Social History   Socioeconomic History   Marital status: Married    Spouse name: Not on file   Number of children: Not on file   Years of education: Not on file   Highest education level: Not on file  Occupational History   Occupation: [employer]--     Comment: retired  Tobacco Use   Smoking status: Never   Smokeless tobacco: Never  Vaping Use   Vaping status: Never Used  Substance and Sexual Activity   Alcohol use: Yes    Alcohol/week: 0.0 standard drinks of alcohol    Comment: occasional wine cooler   Drug use: No   Sexual activity: Not Currently    Partners: Male  Other Topics Concern   Not on file  Social History Narrative   Exercise-- yard work,  Walking in [employer]   03/15/21 lives with husband   Social Drivers of Corporate investment banker Strain: Low Risk  (04/21/2023)   Overall Financial Resource Strain (CARDIA)    Difficulty of Paying Living Expenses: Not hard at all  Food Insecurity: No Food Insecurity (02/21/2024)   Hunger Vital Sign    Worried About Running Out of Food in the Last Year: Never true    Ran Out of Food in the Last Year: Never true  Transportation Needs: No Transportation Needs (02/21/2024)   PRAPARE - Administrator, Civil Service (Medical): No    Lack of Transportation (Non-Medical): No  Physical Activity: Inactive (04/21/2023)   Exercise Vital Sign    Days of Exercise per Week: 0 days    Minutes of Exercise per Session: 0 min  Stress: No Stress Concern Present (04/21/2023)   [employer] of Occupational Health - Occupational Stress Questionnaire    Feeling of Stress : Not at all  Social Connections: Moderately Isolated (02/21/2024)   Social Connection and Isolation Panel    Frequency of Communication with Friends and Family: Twice a week    Frequency of Social Gatherings with Friends and Family: Once a week    Attends Religious Services: Never    Database administrator or Organizations: No    Attends Banker Meetings: Never    Marital Status: Married  Catering manager Violence: Not At Risk (02/21/2024)   Humiliation, Afraid, Rape, and Kick questionnaire    Fear of Current or Ex-Partner: No    Emotionally Abused: No    Physically Abused: No    Sexually Abused: No    Family Status  Relation Name Status   Mother  Deceased   Father  Deceased   Sister  Alive   Sister  Alive   Brother  Deceased   Neg Hx  (Not Specified)  No partnership data on file   Family History  Problem Relation Age of Onset   Diabetes Mother        and on mother's side of family   Colon cancer Mother    Stroke Mother    Diabetes Father        and on father's side of family   Diabetes Sister    Diabetes Sister    Diabetes Brother    Heart disease Neg Hx  Rectal cancer Neg Hx    Stomach cancer Neg Hx    Allergies  Allergen Reactions   Penicillins Nausea And Vomiting    Has patient had a PCN reaction causing immediate rash, facial/tongue/throat swelling, SOB or lightheadedness with hypotension: no Has patient had a PCN reaction causing severe rash involving mucus membranes or skin necrosis: no Has patient had a PCN reaction that required hospitalization: no Has patient had a PCN reaction occurring within the last 10 years: no If all of the above answers are NO, then may proceed with Cephalosporin use.       Review of Systems  Constitutional:  Negative for chills, fever and malaise/fatigue.  HENT:  Negative for congestion and hearing loss.   Eyes:  Negative for blurred vision and discharge.  Respiratory:  Negative for cough, sputum production and shortness of breath.   Cardiovascular:  Negative for chest pain, palpitations and leg swelling.  Gastrointestinal:  Negative for abdominal pain, blood in stool, constipation, diarrhea, heartburn, nausea and vomiting.  Genitourinary:  Negative for dysuria, frequency, hematuria and urgency.  Musculoskeletal:  Negative for back pain, falls and myalgias.  Skin:  Negative for rash.  Neurological:  Negative for dizziness, sensory change, loss of consciousness, weakness and headaches.  Endo/Heme/Allergies:  Negative for environmental allergies. Does not bruise/bleed easily.  Psychiatric/Behavioral:  Negative for depression  and suicidal ideas. The patient is not nervous/anxious and does not have insomnia.       Objective:     BP 118/68 (BP Location: Left Arm, Patient Position: Sitting, Cuff Size: Normal)   Pulse 74   Temp 97.8 F (36.6 C) (Oral)   Resp 18   Ht 5' 4 (1.626 m)   Wt 111 lb 12.8 oz (50.7 kg)   SpO2 100%   BMI 19.19 kg/m  BP Readings from Last 3 Encounters:  02/26/24 118/68  02/22/24 120/66  02/17/24 110/68   Wt Readings from Last 3 Encounters:  02/26/24 111 lb 12.8 oz (50.7 kg)  02/21/24 114 lb (51.7 kg)  02/17/24 112 lb 8 oz (51 kg)   SpO2 Readings from Last 3 Encounters:  02/26/24 100%  02/22/24 100%  02/17/24 95%      Physical Exam Vitals and nursing note reviewed.  Constitutional:      General: She is not in acute distress.    Appearance: Normal appearance. She is well-developed.  HENT:     Head: Normocephalic and atraumatic.   Eyes:     General: No scleral icterus.       Right eye: No discharge.        Left eye: No discharge.    Cardiovascular:     Rate and Rhythm: Normal rate and regular rhythm.     Heart sounds: No murmur heard. Pulmonary:     Effort: Pulmonary effort is normal. No respiratory distress.     Breath sounds: Normal breath sounds.   Musculoskeletal:        General: Normal range of motion.     Cervical back: Normal range of motion and neck supple.     Right lower leg: No edema.     Left lower leg: No edema.   Skin:    General: Skin is warm and dry.   Neurological:     Mental Status: She is alert and oriented to person, place, and time.   Psychiatric:        Mood and Affect: Mood normal.        Behavior: Behavior normal.  Thought Content: Thought content normal.        Judgment: Judgment normal.      No results found for any visits on 02/26/24.  Last CBC Lab Results  Component Value Date   WBC 2.9 (L) 02/22/2024   HGB 10.0 (L) 02/22/2024   HCT 28.4 (L) 02/22/2024   MCV 114.1 (H) 02/22/2024   MCH 40.2 (H) 02/22/2024    RDW 14.6 02/22/2024   PLT 127 (L) 02/22/2024   Last metabolic panel Lab Results  Component Value Date   GLUCOSE 85 02/22/2024   NA 141 02/22/2024   K 3.2 (L) 02/22/2024   CL 113 (H) 02/22/2024   CO2 19 (L) 02/22/2024   BUN 23 02/22/2024   CREATININE 1.40 (H) 02/22/2024   GFRNONAA 38 (L) 02/22/2024   CALCIUM  8.8 (L) 02/22/2024   PROT 6.5 02/21/2024   ALBUMIN 3.5 02/21/2024   BILITOT 1.2 02/21/2024   ALKPHOS 49 02/21/2024   AST 28 02/21/2024   ALT 25 02/21/2024   ANIONGAP 9 02/22/2024   Last lipids Lab Results  Component Value Date   CHOL 142 02/10/2024   HDL 47.40 02/10/2024   LDLCALC 75 02/10/2024   TRIG 97.0 02/10/2024   CHOLHDL 3 02/10/2024   Last hemoglobin A1c Lab Results  Component Value Date   HGBA1C 11.9 (H) 02/21/2024   Last thyroid  functions Lab Results  Component Value Date   TSH 0.73 08/28/2023   Last vitamin D No results found for: 25OHVITD2, 25OHVITD3, VD25OH Last vitamin B12 and Folate No results found for: VITAMINB12, FOLATE    The ASCVD Risk score (Arnett DK, et al., 2019) failed to calculate for the following reasons:   The 2019 ASCVD risk score is only valid for ages 5 to 2   Risk score cannot be calculated because patient has a medical history suggesting prior/existing ASCVD    Assessment & Plan:   Problem List Items Addressed This Visit       Unprioritized   Uncontrolled type 2 diabetes mellitus with hyperglycemia (HCC)   D/w pt importance of taking meds---- she agreed to start lantus  and has app with new endo in July      Relevant Medications   insulin  glargine (LANTUS  SOLOSTAR) 100 UNIT/ML Solostar Pen   Other Visit Diagnoses       Guaiac + stool    -  Primary   Relevant Orders   Basic metabolic panel with GFR   CBC with Differential/Platelet     Hypotension, unspecified hypotension type       Relevant Orders   Basic metabolic panel with GFR   CBC with Differential/Platelet     Assessment and  Plan Assessment & Plan Dizziness and Near Syncope   She experiences severe hot flashes leading to near syncope, causing fear and avoidance of activities like driving. These symptoms may relate to gastrointestinal bleeding, indicated by blood in the stool and a potential drop in hemoglobin levels. Uncontrolled diabetes may exacerbate dizziness due to high blood sugar levels. Recheck hemoglobin levels and encourage monitoring of blood sugar levels to assess any correlation with dizziness.  Gastrointestinal Bleeding   Blood in the stool is confirmed by testing, but the bleeding source is unclear, requiring further evaluation. She is referred to a gastroenterologist and has completed stool sample cards for analysis. Await results to determine the next management steps. Instruct her to call the gastroenterologist if no results are received within the upcoming week.  Uncontrolled Type 2 Diabetes Mellitus   Her diabetes  is poorly controlled with an A1c of 11.9%, indicating high blood sugar levels over the past three months. Although previously resistant to insulin  therapy, it is necessary to improve glycemic control. She is referred to an endocrinologist for further management. A prescription for Lantus  8 units at bedtime is sent to the pharmacy. Ensure her appointment with endocrinologist Dr. Adel on July 8th. Instruct her to use a new glucose meter for monitoring blood sugar levels and discuss potential continuous glucose monitoring with the endocrinologist.  Stroke   She has difficulties with writing due to hand shakiness from a stroke. She is not currently seeing a neurologist. A recent MRI of the brain was conducted, but no follow-up with neurology is planned at this time.  Follow-up   Multiple follow-up appointments and tests are scheduled to address her medical conditions. Ensure attendance at the endocrinologist appointment with Dr. Adel on July 8th. Instruct her to call the gastroenterologist if  no results are received from stool sample cards within the upcoming week.  r  Return in about 3 months (around 05/28/2024) for hypertension, hyperlipidemia, diabetes II.    Donie Lemelin R Lowne Chase, DO

## 2024-02-27 ENCOUNTER — Ambulatory Visit: Payer: Self-pay | Admitting: Oncology

## 2024-02-27 ENCOUNTER — Other Ambulatory Visit: Payer: Self-pay

## 2024-02-27 ENCOUNTER — Inpatient Hospital Stay: Attending: Oncology

## 2024-02-27 DIAGNOSIS — D45 Polycythemia vera: Secondary | ICD-10-CM | POA: Diagnosis not present

## 2024-02-27 DIAGNOSIS — D649 Anemia, unspecified: Secondary | ICD-10-CM

## 2024-02-27 LAB — CBC WITH DIFFERENTIAL (CANCER CENTER ONLY)
Abs Immature Granulocytes: 0.01 10*3/uL (ref 0.00–0.07)
Basophils Absolute: 0 10*3/uL (ref 0.0–0.1)
Basophils Relative: 0 %
Eosinophils Absolute: 0 10*3/uL (ref 0.0–0.5)
Eosinophils Relative: 1 %
HCT: 27.7 % — ABNORMAL LOW (ref 36.0–46.0)
Hemoglobin: 9.8 g/dL — ABNORMAL LOW (ref 12.0–15.0)
Immature Granulocytes: 0 %
Lymphocytes Relative: 39 %
Lymphs Abs: 1.3 10*3/uL (ref 0.7–4.0)
MCH: 40 pg — ABNORMAL HIGH (ref 26.0–34.0)
MCHC: 35.4 g/dL (ref 30.0–36.0)
MCV: 113.1 fL — ABNORMAL HIGH (ref 80.0–100.0)
Monocytes Absolute: 0.2 10*3/uL (ref 0.1–1.0)
Monocytes Relative: 7 %
Neutro Abs: 1.8 10*3/uL (ref 1.7–7.7)
Neutrophils Relative %: 53 %
Platelet Count: 182 10*3/uL (ref 150–400)
RBC: 2.45 MIL/uL — ABNORMAL LOW (ref 3.87–5.11)
RDW: 14.8 % (ref 11.5–15.5)
WBC Count: 3.4 10*3/uL — ABNORMAL LOW (ref 4.0–10.5)
nRBC: 0 % (ref 0.0–0.2)

## 2024-02-27 LAB — BASIC METABOLIC PANEL - CANCER CENTER ONLY
Anion gap: 11 (ref 5–15)
BUN: 25 mg/dL — ABNORMAL HIGH (ref 8–23)
CO2: 20 mmol/L — ABNORMAL LOW (ref 22–32)
Calcium: 9.2 mg/dL (ref 8.9–10.3)
Chloride: 110 mmol/L (ref 98–111)
Creatinine: 1.74 mg/dL — ABNORMAL HIGH (ref 0.44–1.00)
GFR, Estimated: 29 mL/min — ABNORMAL LOW (ref >60)
Glucose, Bld: 141 mg/dL — ABNORMAL HIGH (ref 70–99)
Potassium: 3.7 mmol/L (ref 3.5–5.1)
Sodium: 140 mmol/L (ref 135–145)

## 2024-02-27 NOTE — Telephone Encounter (Signed)
-----   Message from Arley Hof sent at 02/27/2024  2:20 PM EDT ----- Please call patient, the hemoglobin is low, could be secondary to recent bleeding, hold hydroxyurea , office visit with Olam or Hof next 2 to 3 weeks, CBC, blood bank  ----- Message ----- From: Rebecka, Lab [facility] [place] Sent: 02/27/2024   9:37 AM EDT To: Arley KATHEE Hof, MD

## 2024-02-27 NOTE — Telephone Encounter (Signed)
 Patient gave verbal understanding and had no further questions or concerns

## 2024-02-28 ENCOUNTER — Encounter (HOSPITAL_COMMUNITY): Payer: Self-pay | Admitting: Interventional Radiology

## 2024-02-29 ENCOUNTER — Ambulatory Visit: Payer: Self-pay | Admitting: Family Medicine

## 2024-03-04 NOTE — Addendum Note (Signed)
 Addended by: ANTONIO CYNDEE ROCKERS R on: 03/04/2024 04:59 PM   Modules accepted: Orders

## 2024-03-09 DIAGNOSIS — E1165 Type 2 diabetes mellitus with hyperglycemia: Secondary | ICD-10-CM | POA: Diagnosis not present

## 2024-03-16 ENCOUNTER — Inpatient Hospital Stay (HOSPITAL_BASED_OUTPATIENT_CLINIC_OR_DEPARTMENT_OTHER): Admitting: Nurse Practitioner

## 2024-03-16 ENCOUNTER — Inpatient Hospital Stay: Attending: Oncology

## 2024-03-16 ENCOUNTER — Encounter: Payer: Self-pay | Admitting: Nurse Practitioner

## 2024-03-16 VITALS — BP 131/67 | HR 73 | Temp 98.1°F | Ht 64.0 in | Wt 112.9 lb

## 2024-03-16 DIAGNOSIS — E119 Type 2 diabetes mellitus without complications: Secondary | ICD-10-CM | POA: Insufficient documentation

## 2024-03-16 DIAGNOSIS — D45 Polycythemia vera: Secondary | ICD-10-CM | POA: Diagnosis not present

## 2024-03-16 DIAGNOSIS — I10 Essential (primary) hypertension: Secondary | ICD-10-CM | POA: Insufficient documentation

## 2024-03-16 DIAGNOSIS — D649 Anemia, unspecified: Secondary | ICD-10-CM | POA: Diagnosis not present

## 2024-03-16 DIAGNOSIS — D709 Neutropenia, unspecified: Secondary | ICD-10-CM | POA: Diagnosis not present

## 2024-03-16 DIAGNOSIS — Z8673 Personal history of transient ischemic attack (TIA), and cerebral infarction without residual deficits: Secondary | ICD-10-CM | POA: Diagnosis not present

## 2024-03-16 DIAGNOSIS — D471 Chronic myeloproliferative disease: Secondary | ICD-10-CM | POA: Diagnosis not present

## 2024-03-16 LAB — CBC WITH DIFFERENTIAL (CANCER CENTER ONLY)
Abs Immature Granulocytes: 0 K/uL (ref 0.00–0.07)
Basophils Absolute: 0 K/uL (ref 0.0–0.1)
Basophils Relative: 0 %
Eosinophils Absolute: 0 K/uL (ref 0.0–0.5)
Eosinophils Relative: 1 %
HCT: 29.8 % — ABNORMAL LOW (ref 36.0–46.0)
Hemoglobin: 10 g/dL — ABNORMAL LOW (ref 12.0–15.0)
Immature Granulocytes: 0 %
Lymphocytes Relative: 39 %
Lymphs Abs: 1.2 K/uL (ref 0.7–4.0)
MCH: 40.3 pg — ABNORMAL HIGH (ref 26.0–34.0)
MCHC: 33.6 g/dL (ref 30.0–36.0)
MCV: 120.2 fL — ABNORMAL HIGH (ref 80.0–100.0)
Monocytes Absolute: 0.3 K/uL (ref 0.1–1.0)
Monocytes Relative: 11 %
Neutro Abs: 1.5 K/uL — ABNORMAL LOW (ref 1.7–7.7)
Neutrophils Relative %: 49 %
Platelet Count: 220 K/uL (ref 150–400)
RBC: 2.48 MIL/uL — ABNORMAL LOW (ref 3.87–5.11)
RDW: 15.5 % (ref 11.5–15.5)
WBC Count: 3.1 K/uL — ABNORMAL LOW (ref 4.0–10.5)
nRBC: 0 % (ref 0.0–0.2)

## 2024-03-16 LAB — SAMPLE TO BLOOD BANK

## 2024-03-16 NOTE — Progress Notes (Signed)
  [facility] OFFICE PROGRESS NOTE   Diagnosis: Polycythemia vera  INTERVAL HISTORY:   Brandi Mccormick returns for follow-up.  Hydrea  was placed on hold on 02/27/2024 due to anemia.    She was hospitalized 02/21/2024 - 02/22/2024 following an episode of near syncope/syncope.  She was found to have orthostatic hypotension.  This improved with hydration.  Labs on admission showed a hemoglobin of 10.2, down from 12.2 on 02/10/2024 at which time a stool was also positive for occult blood.  Hemoglobin remained stable in the hospital.  She was seen by GI on 02/17/2024 due to recent diarrhea, weight loss.  She declined endoscopic evaluation, agreed to repeat a series of stool cards and if positive go ahead with GI evaluation.  Multiple stool cards were negative on 02/26/2024.  She is no longer having diarrhea.  She is not aware of any bleeding.  Appetite is improving.  No lightheadedness or dizziness.  Only complaint is having a headache prior to a bowel movement.  Objective:  Vital signs in last 24 hours:  Blood pressure 131/67, pulse 73, temperature 98.1 F (36.7 C), temperature source Oral, height 5' 4 (1.626 m), weight 112 lb 14.4 oz (51.2 kg), SpO2 100%.   She is frail-appearing.  Resp: Lungs clear bilaterally. Cardio: Regular rate and rhythm. GI: No hepatosplenomegaly. Vascular: No leg edema.   Lab Results:  Lab Results  Component Value Date   WBC 3.1 (L) 03/16/2024   HGB 10.0 (L) 03/16/2024   HCT 29.8 (L) 03/16/2024   MCV 120.2 (H) 03/16/2024   PLT 220 03/16/2024   NEUTROABS 1.5 (L) 03/16/2024    Imaging:  No results found.  Medications: I have reviewed the patient's current medications.  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) Hydroxyurea  500 mg twice daily 06/26/2018 Hydroxyurea  decreased to 500 mg daily beginning 07/27/2018 Hydroxyurea  placed on hold 08/17/2018 Hydroxyurea  resumed at a dose of 500 mg every other day beginning  08/31/2018 Hydrea  increased to 500 mg daily beginning 09/21/2018 Hydrea  dose adjusted to 500 mg Monday through Thursday, off Friday Saturday Sunday due to anemia beginning 01/23/2021 Hydroxyurea  adjusted to 500 mg daily 06/13/2023 secondary to a rise in the hemoglobin and platelet count Hydroxyurea  placed on hold 02/27/2024 due to anemia Hydrea  remains on hold 03/16/2024 due to persistent anemia Type 2 diabetes Hypertension 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  She has polycythemia vera.  Hydroxyurea  was placed on hold about 2 weeks ago due to anemia.  CBC from today shows persistent anemia and mild neutropenia.  She will continue to hold hydroxyurea .  She will return for lab and follow-up in 2 weeks.  We are available to see her sooner if needed.    Olam Ned ANP/GNP-BC   03/16/2024  2:06 PM

## 2024-03-30 DIAGNOSIS — E1165 Type 2 diabetes mellitus with hyperglycemia: Secondary | ICD-10-CM | POA: Diagnosis not present

## 2024-03-30 DIAGNOSIS — Z8673 Personal history of transient ischemic attack (TIA), and cerebral infarction without residual deficits: Secondary | ICD-10-CM | POA: Diagnosis not present

## 2024-03-30 DIAGNOSIS — E785 Hyperlipidemia, unspecified: Secondary | ICD-10-CM | POA: Diagnosis not present

## 2024-03-30 DIAGNOSIS — I10 Essential (primary) hypertension: Secondary | ICD-10-CM | POA: Diagnosis not present

## 2024-03-31 ENCOUNTER — Inpatient Hospital Stay (HOSPITAL_BASED_OUTPATIENT_CLINIC_OR_DEPARTMENT_OTHER): Admitting: Nurse Practitioner

## 2024-03-31 ENCOUNTER — Telehealth: Payer: Self-pay

## 2024-03-31 ENCOUNTER — Encounter: Payer: Self-pay | Admitting: Nurse Practitioner

## 2024-03-31 ENCOUNTER — Inpatient Hospital Stay

## 2024-03-31 VITALS — BP 127/67 | HR 76 | Temp 97.7°F | Resp 18 | Ht 64.0 in | Wt 113.6 lb

## 2024-03-31 DIAGNOSIS — D649 Anemia, unspecified: Secondary | ICD-10-CM | POA: Diagnosis not present

## 2024-03-31 DIAGNOSIS — D45 Polycythemia vera: Secondary | ICD-10-CM | POA: Diagnosis not present

## 2024-03-31 DIAGNOSIS — I10 Essential (primary) hypertension: Secondary | ICD-10-CM | POA: Diagnosis not present

## 2024-03-31 DIAGNOSIS — E119 Type 2 diabetes mellitus without complications: Secondary | ICD-10-CM | POA: Diagnosis not present

## 2024-03-31 DIAGNOSIS — D471 Chronic myeloproliferative disease: Secondary | ICD-10-CM

## 2024-03-31 DIAGNOSIS — Z8673 Personal history of transient ischemic attack (TIA), and cerebral infarction without residual deficits: Secondary | ICD-10-CM | POA: Diagnosis not present

## 2024-03-31 DIAGNOSIS — D709 Neutropenia, unspecified: Secondary | ICD-10-CM | POA: Diagnosis not present

## 2024-03-31 LAB — CBC WITH DIFFERENTIAL (CANCER CENTER ONLY)
Abs Immature Granulocytes: 0 K/uL (ref 0.00–0.07)
Basophils Absolute: 0 K/uL (ref 0.0–0.1)
Basophils Relative: 0 %
Eosinophils Absolute: 0.1 K/uL (ref 0.0–0.5)
Eosinophils Relative: 1 %
HCT: 33.3 % — ABNORMAL LOW (ref 36.0–46.0)
Hemoglobin: 11.1 g/dL — ABNORMAL LOW (ref 12.0–15.0)
Immature Granulocytes: 0 %
Lymphocytes Relative: 29 %
Lymphs Abs: 1.1 K/uL (ref 0.7–4.0)
MCH: 39.2 pg — ABNORMAL HIGH (ref 26.0–34.0)
MCHC: 33.3 g/dL (ref 30.0–36.0)
MCV: 117.7 fL — ABNORMAL HIGH (ref 80.0–100.0)
Monocytes Absolute: 0.4 K/uL (ref 0.1–1.0)
Monocytes Relative: 10 %
Neutro Abs: 2.4 K/uL (ref 1.7–7.7)
Neutrophils Relative %: 60 %
Platelet Count: 345 K/uL (ref 150–400)
RBC: 2.83 MIL/uL — ABNORMAL LOW (ref 3.87–5.11)
RDW: 13.1 % (ref 11.5–15.5)
WBC Count: 3.9 K/uL — ABNORMAL LOW (ref 4.0–10.5)
nRBC: 0 % (ref 0.0–0.2)

## 2024-03-31 LAB — SAMPLE TO BLOOD BANK

## 2024-03-31 NOTE — Progress Notes (Signed)
  [facility] OFFICE PROGRESS NOTE   Diagnosis: Polycythemia vera  INTERVAL HISTORY:   Brandi Mccormick returns as scheduled.  Hydrea  is on hold.  She did not bleeding.  No symptom of thrombosis.  No recurrent symptoms of presyncope/syncope.  Objective:  Vital signs in last 24 hours:  Blood pressure 127/67, pulse 76, temperature 97.7 F (36.5 C), temperature source Temporal, resp. rate 18, height 5' 4 (1.626 m), weight 113 lb 9.6 oz (51.5 kg), SpO2 100%.    HEENT: No thrush or ulcers. Resp: Lungs clear bilaterally. Cardio: Regular rate and rhythm. GI: No hepatosplenomegaly. Vascular: No leg edema. Neuro: Alert and oriented.    Lab Results:  Lab Results  Component Value Date   WBC 3.9 (L) 03/31/2024   HGB 11.1 (L) 03/31/2024   HCT 33.3 (L) 03/31/2024   MCV 117.7 (H) 03/31/2024   PLT 345 03/31/2024   NEUTROABS 2.4 03/31/2024    Imaging:  No results found.  Medications: I have reviewed the patient's current medications.  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) Hydroxyurea  500 mg twice daily 06/26/2018 Hydroxyurea  decreased to 500 mg daily beginning 07/27/2018 Hydroxyurea  placed on hold 08/17/2018 Hydroxyurea  resumed at a dose of 500 mg every other day beginning 08/31/2018 Hydrea  increased to 500 mg daily beginning 09/21/2018 Hydrea  dose adjusted to 500 mg Monday through Thursday, off Friday Saturday Sunday due to anemia beginning 01/23/2021 Hydroxyurea  adjusted to 500 mg daily 06/13/2023 secondary to a rise in the hemoglobin and platelet count Hydroxyurea  placed on hold 02/27/2024 due to anemia Hydrea  remains on hold 03/16/2024 due to persistent anemia Hydrea  remains on hold 03/31/2024 due to persistent anemia Type 2 diabetes Hypertension 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  She had orthostatic hypotension, improved with hydration.  Progressive anemia.  Hemoglobin remained stable in the hospital.    Disposition: Brandi Mccormick appears stable.  Hydrea  has been on hold due to anemia.  Hemoglobin is better but she remains anemic.  She will return for lab and follow-up in 2 weeks.  She reports she is no longer taking Plavix  or aspirin .  We will ask Dr. Antonio about resuming daily aspirin .    Olam Ned ANP/GNP-BC   03/31/2024  11:22 AM

## 2024-03-31 NOTE — Telephone Encounter (Signed)
 Patient was identified as falling into the [place] Measure - Diabetes.   Patient was: Appointment already scheduled for:  F/U with PCP. Completed f/u with Endo on 03/30/24.

## 2024-04-01 DIAGNOSIS — E785 Hyperlipidemia, unspecified: Secondary | ICD-10-CM | POA: Diagnosis not present

## 2024-04-01 DIAGNOSIS — N184 Chronic kidney disease, stage 4 (severe): Secondary | ICD-10-CM | POA: Diagnosis not present

## 2024-04-01 DIAGNOSIS — I10 Essential (primary) hypertension: Secondary | ICD-10-CM | POA: Diagnosis not present

## 2024-04-01 DIAGNOSIS — E1122 Type 2 diabetes mellitus with diabetic chronic kidney disease: Secondary | ICD-10-CM | POA: Diagnosis not present

## 2024-04-01 DIAGNOSIS — E1129 Type 2 diabetes mellitus with other diabetic kidney complication: Secondary | ICD-10-CM | POA: Diagnosis not present

## 2024-04-01 DIAGNOSIS — I129 Hypertensive chronic kidney disease with stage 1 through stage 4 chronic kidney disease, or unspecified chronic kidney disease: Secondary | ICD-10-CM | POA: Diagnosis not present

## 2024-04-01 DIAGNOSIS — N051 Unspecified nephritic syndrome with focal and segmental glomerular lesions: Secondary | ICD-10-CM | POA: Diagnosis not present

## 2024-04-01 DIAGNOSIS — D75839 Thrombocytosis, unspecified: Secondary | ICD-10-CM | POA: Diagnosis not present

## 2024-04-05 ENCOUNTER — Other Ambulatory Visit: Payer: Self-pay | Admitting: Family Medicine

## 2024-04-06 ENCOUNTER — Telehealth: Payer: Self-pay

## 2024-04-06 NOTE — Telephone Encounter (Signed)
 Patient gave verbal understanding and had no further questions or concerns

## 2024-04-06 NOTE — Addendum Note (Signed)
 Addended by: Ida Milbrath S on: 04/06/2024 03:18 PM   Modules accepted: Orders

## 2024-04-06 NOTE — Telephone Encounter (Signed)
-----   Message from Olam Ned sent at 04/06/2024  1:23 PM EDT ----- Please let Ms. Maguire know I heard back from Dr. Antonio.  We would like for her to resume daily aspirin .

## 2024-04-07 ENCOUNTER — Telehealth: Payer: Self-pay | Admitting: Neurology

## 2024-04-07 ENCOUNTER — Telehealth: Payer: Self-pay | Admitting: Oncology

## 2024-04-07 NOTE — Telephone Encounter (Signed)
 PT called to reschedule 8/25 appt since she will be taking her grand daughter to school on her first day.

## 2024-04-07 NOTE — Telephone Encounter (Signed)
 Copied from CRM [id number]. Topic: Clinical - Medication Question >> Apr 07, 2024  9:52 AM Harlene ORN wrote: Reason for CRM: Patient needs more information about taking her aspirin . Please call back and advise patient.

## 2024-04-07 NOTE — Telephone Encounter (Signed)
 Spoke with patient. Pt advised when to take medication

## 2024-04-13 ENCOUNTER — Other Ambulatory Visit: Payer: Self-pay | Admitting: Family Medicine

## 2024-04-16 ENCOUNTER — Inpatient Hospital Stay: Attending: Oncology

## 2024-04-16 ENCOUNTER — Inpatient Hospital Stay (HOSPITAL_BASED_OUTPATIENT_CLINIC_OR_DEPARTMENT_OTHER): Admitting: Oncology

## 2024-04-16 VITALS — BP 133/69 | HR 71 | Temp 97.8°F | Resp 18 | Ht 64.0 in | Wt 112.1 lb

## 2024-04-16 DIAGNOSIS — I10 Essential (primary) hypertension: Secondary | ICD-10-CM | POA: Insufficient documentation

## 2024-04-16 DIAGNOSIS — D45 Polycythemia vera: Secondary | ICD-10-CM | POA: Insufficient documentation

## 2024-04-16 DIAGNOSIS — E119 Type 2 diabetes mellitus without complications: Secondary | ICD-10-CM | POA: Insufficient documentation

## 2024-04-16 DIAGNOSIS — Z8673 Personal history of transient ischemic attack (TIA), and cerebral infarction without residual deficits: Secondary | ICD-10-CM | POA: Diagnosis not present

## 2024-04-16 DIAGNOSIS — D649 Anemia, unspecified: Secondary | ICD-10-CM | POA: Diagnosis not present

## 2024-04-16 DIAGNOSIS — Z79899 Other long term (current) drug therapy: Secondary | ICD-10-CM | POA: Insufficient documentation

## 2024-04-16 LAB — CBC WITH DIFFERENTIAL (CANCER CENTER ONLY)
Abs Immature Granulocytes: 0.02 K/uL (ref 0.00–0.07)
Basophils Absolute: 0 K/uL (ref 0.0–0.1)
Basophils Relative: 0 %
Eosinophils Absolute: 0.1 K/uL (ref 0.0–0.5)
Eosinophils Relative: 2 %
HCT: 35.5 % — ABNORMAL LOW (ref 36.0–46.0)
Hemoglobin: 11.7 g/dL — ABNORMAL LOW (ref 12.0–15.0)
Immature Granulocytes: 0 %
Lymphocytes Relative: 29 %
Lymphs Abs: 1.6 K/uL (ref 0.7–4.0)
MCH: 37.1 pg — ABNORMAL HIGH (ref 26.0–34.0)
MCHC: 33 g/dL (ref 30.0–36.0)
MCV: 112.7 fL — ABNORMAL HIGH (ref 80.0–100.0)
Monocytes Absolute: 0.5 K/uL (ref 0.1–1.0)
Monocytes Relative: 9 %
Neutro Abs: 3.3 K/uL (ref 1.7–7.7)
Neutrophils Relative %: 60 %
Platelet Count: 446 K/uL — ABNORMAL HIGH (ref 150–400)
RBC: 3.15 MIL/uL — ABNORMAL LOW (ref 3.87–5.11)
RDW: 13.2 % (ref 11.5–15.5)
WBC Count: 5.5 K/uL (ref 4.0–10.5)
nRBC: 0 % (ref 0.0–0.2)

## 2024-04-16 NOTE — Progress Notes (Signed)
  [facility] OFFICE PROGRESS NOTE   Diagnosis: Polycythemia vera  INTERVAL HISTORY:   [name] returns as scheduled.  She feels well.  No bleeding or symptom of thrombosis.  She scraped her leg on a trash can last week.  She is using peroxide on the lesion.  She remains off of hydroxyurea .  She is taking aspirin .  Objective:  Vital signs in last 24 hours:  Blood pressure 133/69, pulse 71, temperature 97.8 F (36.6 C), temperature source Temporal, resp. rate 18, height 5' 4 (1.626 m), weight 112 lb 1.6 oz (50.8 kg), SpO2 100%.   Resp: Lungs clear bilaterally Cardio: Regular rate and rhythm GI: No hepatosplenomegaly Vascular: No leg edema  Skin: 1.5 cm superficial abrasion at the right lower leg.  The lesion appears to be healing  Lab Results:  Lab Results  Component Value Date   WBC 5.5 04/16/2024   HGB 11.7 (L) 04/16/2024   HCT 35.5 (L) 04/16/2024   MCV 112.7 (H) 04/16/2024   PLT 446 (H) 04/16/2024   NEUTROABS 3.3 04/16/2024    CMP  Lab Results  Component Value Date   NA 140 02/27/2024   K 3.7 02/27/2024   CL 110 02/27/2024   CO2 20 (L) 02/27/2024   GLUCOSE 141 (H) 02/27/2024   BUN 25 (H) 02/27/2024   CREATININE 1.74 (H) 02/27/2024   CALCIUM  9.2 02/27/2024   PROT 6.5 02/21/2024   ALBUMIN 3.5 02/21/2024   AST 28 02/21/2024   ALT 25 02/21/2024   ALKPHOS 49 02/21/2024   BILITOT 1.2 02/21/2024   GFRNONAA 29 (L) 02/27/2024   GFRAA 24 (L) 06/10/2019    Medications: I have reviewed the patient's current medications.   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) Hydroxyurea  500 mg twice daily 06/26/2018 Hydroxyurea  decreased to 500 mg daily beginning 07/27/2018 Hydroxyurea  placed on hold 08/17/2018 Hydroxyurea  resumed at a dose of 500 mg every other day beginning 08/31/2018 Hydrea  increased to 500 mg daily beginning 09/21/2018 Hydrea  dose adjusted to 500 mg Monday through Thursday, off Friday Saturday Sunday due to anemia  beginning 01/23/2021 Hydroxyurea  adjusted to 500 mg daily 06/13/2023 secondary to a rise in the hemoglobin and platelet count Hydroxyurea  placed on hold 02/27/2024 due to anemia Hydrea  remains on hold 03/16/2024 due to persistent anemia Hydrea  remains on hold 03/31/2024 due to persistent anemia Hydrea  resumed at a dose of 500 mg Monday, Wednesday, and Friday, 04/19/2024 Type 2 diabetes Hypertension 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  She had orthostatic hypotension, improved with hydration.  Progressive anemia.  Hemoglobin remained stable in the hospital.      Disposition: [name] appears well.  The hemoglobin is higher and the platelet count is mildly elevated.  She will resume hydroxyurea  at a dose of 500 mg on Monday, Wednesday, and Friday.  She will return for an office and lab visit in 4-5 weeks.  The superficial right lower extremity abrasion appears to be healing.  She will call if the lesion does not heal.  Arley Hof, MD  04/16/2024  12:11 PM

## 2024-04-21 ENCOUNTER — Ambulatory Visit (INDEPENDENT_AMBULATORY_CARE_PROVIDER_SITE_OTHER)

## 2024-04-21 VITALS — BP 133/69 | Ht 64.0 in | Wt 112.0 lb

## 2024-04-21 DIAGNOSIS — Z2821 Immunization not carried out because of patient refusal: Secondary | ICD-10-CM

## 2024-04-21 DIAGNOSIS — Z0000 Encounter for general adult medical examination without abnormal findings: Secondary | ICD-10-CM | POA: Diagnosis not present

## 2024-04-21 NOTE — Progress Notes (Signed)
 Because this visit was a virtual/telehealth visit,  certain criteria was not obtained, such a blood pressure, CBG if applicable, and timed get up and go. Any medications not marked as taking were not mentioned during the medication reconciliation part of the visit. Any vitals not documented were not able to be obtained due to this being a telehealth visit or patient was unable to self-report a recent blood pressure reading due to a lack of equipment at home via telehealth. Vitals that have been documented are verbally provided by the patient.  This visit was performed by a medical professional under my direct supervision. I was immediately available for consultation/collaboration. I have reviewed and agree with the Annual Wellness Visit documentation.  Subjective:   Brandi Mccormick is a 83 y.o. who presents for a Medicare Wellness preventive visit.  As a reminder, Annual Wellness Visits don't include a physical exam, and some assessments may be limited, especially if this visit is performed virtually. We may recommend an in-person follow-up visit with your provider if needed.  Visit Complete: Virtual I connected with  Brandi Mccormick on 04/21/24 by a audio enabled telemedicine application and verified that I am speaking with the correct person using two identifiers.  Patient Location: Home  Provider Location: Home Office  I discussed the limitations of evaluation and management by telemedicine. The patient expressed understanding and agreed to proceed.  Vital Signs: Because this visit was a virtual/telehealth visit, some criteria may be missing or patient reported. Any vitals not documented were not able to be obtained and vitals that have been documented are patient reported.  VideoDeclined- This patient declined Librarian, academic. Therefore the visit was completed with audio only.  Persons Participating in Visit: Patient.  AWV Questionnaire: No: Patient  Medicare AWV questionnaire was not completed prior to this visit.  Cardiac Risk Factors include: advanced age (>77men, >42 women);hypertension;diabetes mellitus;dyslipidemia     Objective:    Today's Vitals   04/21/24 1120  BP: 133/69  Weight: 112 lb (50.8 kg)  Height: 5' 4 (1.626 m)  PainSc: 0-No pain   Body mass index is 19.22 kg/m.     04/21/2024   11:19 AM 04/16/2024   11:47 AM 03/31/2024   10:53 AM 03/16/2024    1:35 PM 02/21/2024    2:02 PM 10/28/2023    9:40 AM 06/13/2023   11:07 AM  Advanced Directives  Does Patient Have a Medical Advance Directive? Yes Yes Yes No No No No  Type of Advance Directive Living will;Healthcare Power of Attorney Living will;Healthcare Power of Attorney Living will;Healthcare Power of Attorney      Does patient want to make changes to medical advance directive? No - Patient declined  No - Patient declined      Copy of Healthcare Power of Attorney in Chart? No - copy requested No - copy requested No - copy requested      Would patient like information on creating a medical advance directive?    No - Patient declined No - Patient declined No - Patient declined No - Patient declined    Current Medications (verified) Outpatient Encounter Medications as of 04/21/2024  Medication Sig   aspirin  EC 81 MG tablet Take 81 mg by mouth daily. Swallow whole.   atorvastatin  (LIPITOR) 80 MG tablet Take 1 tablet by mouth once daily   Blood Glucose Monitoring Suppl (TRUE METRIX METER) w/Device KIT Use as advised   empagliflozin  (JARDIANCE ) 10 MG TABS tablet Take 1 tablet (10  mg total) by mouth daily before breakfast.   ezetimibe  (ZETIA ) 10 MG tablet Take 1 tablet by mouth once daily   glipiZIDE  (GLUCOTROL  XL) 5 MG 24 hr tablet Take 2 tablets (10 mg total) by mouth daily.   glucose blood (TRUE METRIX BLOOD GLUCOSE TEST) test strip Use as instructed 2x a day   hydroxyurea  (HYDREA ) 500 MG capsule Take 1 capsule (500 mg total) by mouth daily. May take with food to  minimize GI side effects. (Patient taking differently: Take 500 mg by mouth every Monday, Wednesday, and Friday. May take with food to minimize GI side effects.)   lisinopril  (ZESTRIL ) 20 MG tablet Take 1 tablet (20 mg total) by mouth daily.   metoprolol  succinate (TOPROL -XL) 25 MG 24 hr tablet TAKE 1 TABLET BY MOUTH ONCE DAILY WITH MEALS   pantoprazole  (PROTONIX ) 40 MG tablet Take 1 tablet (40 mg total) by mouth daily.   No facility-administered encounter medications on file as of 04/21/2024.    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   Diabetes (HCC)    GERD (gastroesophageal reflux disease)    Hyperlipidemia    Hypertension    Hyperthyroidism    Pneumonia    Polycythemia    Shingles    Past Surgical History:  Procedure Laterality Date   CATARACT EXTRACTION Left 2020   COLONOSCOPY     DENTAL SURGERY     IR ANGIO INTRA EXTRACRAN SEL COM CAROTID INNOMINATE BILAT MOD SED  10/09/2020   IR ANGIO VERTEBRAL SEL VERTEBRAL BILAT MOD SED  10/09/2020   Family History  Problem Relation Age of Onset   Diabetes Mother        and on mother's side of family   Colon cancer Mother    Stroke Mother    Diabetes Father        and on father's side of family   Diabetes Sister    Diabetes Sister    Diabetes Brother    Heart disease Neg Hx    Rectal cancer Neg Hx    Stomach cancer Neg Hx    Social History   Socioeconomic History   Marital status: Married    Spouse name: Not on file   Number of children: Not on file   Years of education: Not on file   Highest education level: Not on file  Occupational History   Occupation: [employer]--    Comment: retired  Tobacco Use   Smoking status: Never   Smokeless tobacco: Never  Vaping Use   Vaping status: Never Used  Substance and Sexual Activity   Alcohol use: Yes    Alcohol/week: 0.0 standard drinks of alcohol    Comment: occasional wine cooler   Drug  use: No   Sexual activity: Not Currently    Partners: Male  Other Topics Concern   Not on file  Social History Narrative   Exercise-- yard work,  Walking in [employer]   03/15/21 lives with husband   Social Drivers of Corporate investment banker Strain: Low Risk  (04/21/2024)   Overall Financial Resource Strain (CARDIA)    Difficulty of Paying Living Expenses: Not hard at all  Food Insecurity: No Food Insecurity (04/21/2024)   Hunger Vital Sign    Worried About Running Out of Food in the Last Year: Never true    Ran Out of Food in the Last Year: Never true  Transportation Needs: No Transportation Needs (  04/21/2024)   PRAPARE - Administrator, Civil Service (Medical): No    Lack of Transportation (Non-Medical): No  Physical Activity: Insufficiently Active (04/21/2024)   Exercise Vital Sign    Days of Exercise per Week: 2 days    Minutes of Exercise per Session: 30 min  Stress: No Stress Concern Present (04/21/2024)   [employer] of Occupational Health - Occupational Stress Questionnaire    Feeling of Stress: Not at all  Social Connections: Moderately Isolated (04/21/2024)   Social Connection and Isolation Panel    Frequency of Communication with Friends and Family: Three times a week    Frequency of Social Gatherings with Friends and Family: Once a week    Attends Religious Services: Never    Database administrator or Organizations: No    Attends Engineer, structural: Never    Marital Status: Married    Tobacco Counseling Counseling given: Not Answered    Clinical Intake:  Pre-visit preparation completed: Yes  Pain : No/denies pain Pain Score: 0-No pain     BMI - recorded: 19.22 Nutritional Status: BMI of 19-24  Normal Nutritional Risks: None Diabetes: Yes CBG done?: No Did pt. bring in CBG monitor from home?: No  Lab Results  Component Value Date   HGBA1C 11.9 (H) 02/21/2024   HGBA1C 12.7 (H) 02/10/2024   HGBA1C 11.2 (A) 11/04/2023      How often do you need to have someone help you when you read instructions, pamphlets, or other written materials from your doctor or pharmacy?: 1 - Never  Interpreter Needed?: No  Information entered by :: Season Astacio,CMA   Activities of Daily Living     04/21/2024   11:22 AM 02/21/2024    6:03 PM  In your present state of health, do you have any difficulty performing the following activities:  Hearing? 0   Vision? 0   Difficulty concentrating or making decisions? 0   Walking or climbing stairs? 0   Dressing or bathing? 0   Doing errands, shopping? 0 0  Preparing Food and eating ? N   Using the Toilet? N   In the past six months, have you accidently leaked urine? N   Do you have problems with loss of bowel control? N   Managing your Medications? N   Managing your Finances? N   Housekeeping or managing your Housekeeping? N     Patient Care Team: Antonio Meth, Jamee SAUNDERS, DO as PCP - General (Family Medicine) Trixie File, MD as Consulting Physician (Internal Medicine) [facility] (Inactive) as Consulting Physician (Podiatry) Norine Manuelita LABOR, MD as Attending Physician (Nephrology) Stover, Titorya, DPM as Consulting Physician (Podiatry) Leslee Reusing, MD as Consulting Physician (Ophthalmology) Gaynel Delon CROME, DPM as Consulting Physician (Podiatry)  I have updated your Care Teams any recent Medical Services you may have received from other providers in the past year.     Assessment:   This is a routine wellness examination for Saleen.  Hearing/Vision screen Hearing Screening - Comments:: No difficulties  Vision Screening - Comments:: Patient wears readers   Goals Addressed             This Visit's Progress    DIET - INCREASE WATER INTAKE   On track      Depression Screen     04/21/2024   11:24 AM 04/16/2024   11:47 AM 03/16/2024    1:00 PM 02/26/2024    9:35 AM 08/28/2023  9:45 AM 04/21/2023   10:20 AM  08/19/2022   10:05 AM  PHQ 2/9 Scores  PHQ - 2 Score 0 0 0 0 0 0 0  PHQ- 9 Score 1    0      Fall Risk     04/21/2024   11:22 AM 08/28/2023    9:45 AM 04/21/2023   10:16 AM 08/19/2022   10:05 AM 04/16/2022   10:04 AM  Fall Risk   Falls in the past year? 0 0 0 0 0  Number falls in past yr: 0 0 0 0 0  Injury with Fall? 0 0 0 0 0  Risk for fall due to : No Fall Risks No Fall Risks No Fall Risks  History of fall(s)  Follow up Falls evaluation completed Falls evaluation completed Falls evaluation completed Falls evaluation completed  Falls evaluation completed      Data saved with a previous flowsheet row definition    MEDICARE RISK AT HOME:  Medicare Risk at Home Any stairs in or around the home?: Yes If so, are there any without handrails?: No Home free of loose throw rugs in walkways, pet beds, electrical cords, etc?: Yes Adequate lighting in your home to reduce risk of falls?: Yes Life alert?: No Use of a cane, walker or w/c?: No Grab bars in the bathroom?: No Shower chair or bench in shower?: No Elevated toilet seat or a handicapped toilet?: No  TIMED UP AND GO:  Was the test performed?  No  Cognitive Function: 6CIT completed    04/06/2020    9:32 AM 04/02/2018    8:05 AM 03/21/2017    8:11 AM 04/24/2015   11:09 AM  MMSE - Mini Mental State Exam  Not completed: Refused     Orientation to time  5 5  5    Orientation to Place  5 5  5    Registration  3 3  3    Attention/ Calculation  4 3  4    Recall  2 2  2    Language- name 2 objects  2 2  2    Language- repeat  1 1 1   Language- follow 3 step command  3 3  3    Language- read & follow direction  1 1  1    Write a sentence  1 1  1    Copy design  1 1  1    Total score  28 27  28       Data saved with a previous flowsheet row definition        04/21/2024   11:24 AM 04/21/2023   10:21 AM 04/16/2022   10:12 AM 04/10/2021   10:35 AM  6CIT Screen  What Year? 0 points 0 points 0 points 0 points  What month? 0 points 0 points  0 points 0 points  What time? 0 points 0 points 0 points 0 points  Count back from 20 0 points 2 points 2 points 0 points  Months in reverse 0 points 0 points 4 points 4 points  Repeat phrase 0 points 4 points 2 points 0 points  Total Score 0 points 6 points 8 points 4 points    Immunizations Immunization History  Administered Date(s) Administered   PFIZER(Purple Top)SARS-COV-2 Vaccination 10/31/2019, 11/24/2019, 08/01/2020   Pneumococcal Polysaccharide-23 11/16/2013    Screening Tests Health Maintenance  Topic Date Due   OPHTHALMOLOGY EXAM  09/14/2020   COVID-19 Vaccine (4 - 2024-25 season) 05/04/2023   INFLUENZA VACCINE  05/03/2024 (Originally 04/02/2024)  Zoster Vaccines- Shingrix (1 of 2) 05/12/2024 (Originally 10/02/1959)   DTaP/Tdap/Td (1 - Tdap) 08/27/2024 (Originally 10/02/1959)   Pneumococcal Vaccine: 50+ Years (2 of 2 - PCV) 02/09/2025 (Originally 11/17/2014)   HEMOGLOBIN A1C  08/22/2024   FOOT EXAM  08/27/2024   Diabetic kidney evaluation - Urine ACR  02/09/2025   Diabetic kidney evaluation - eGFR measurement  02/26/2025   Medicare Annual Wellness (AWV)  04/21/2025   DEXA SCAN  Addressed   HPV VACCINES  Aged Out   Meningococcal B Vaccine  Aged Out   Colonoscopy  Discontinued   Hepatitis C Screening  Discontinued    Health Maintenance  Health Maintenance Due  Topic Date Due   OPHTHALMOLOGY EXAM  09/14/2020   COVID-19 Vaccine (4 - 2024-25 season) 05/04/2023   Health Maintenance Items Addressed:patient declined vaccination  Additional Screening:  Vision Screening: Recommended annual ophthalmology exams for early detection of glaucoma and other disorders of the eye. Would you like a referral to an eye doctor? No    Dental Screening: Recommended annual dental exams for proper oral hygiene  Community Resource Referral / Chronic Care Management: CRR required this visit?  No   CCM required this visit?  No   Plan:    I have personally reviewed and noted the  following in the patient's chart:   Medical and social history Use of alcohol, tobacco or illicit drugs  Current medications and supplements including opioid prescriptions. Patient is not currently taking opioid prescriptions. Functional ability and status Nutritional status Physical activity Advanced directives List of other physicians Hospitalizations, surgeries, and ER visits in previous 12 months Vitals Screenings to include cognitive, depression, and falls Referrals and appointments  In addition, I have reviewed and discussed with patient certain preventive protocols, quality metrics, and best practice recommendations. A written personalized care plan for preventive services as well as general preventive health recommendations were provided to patient.   Lyle MARLA Right, [place]   04/21/2024   After Visit Summary: (MyChart) Due to this being a telephonic visit, the after visit summary with patients personalized plan was offered to patient via MyChart   Notes: Nothing significant to report at this time.

## 2024-04-21 NOTE — Patient Instructions (Signed)
 [name] , Thank you for taking time out of your busy schedule to complete your Annual Wellness Visit with me. I enjoyed our conversation and look forward to speaking with you again next year. I, as well as your care team,  appreciate your ongoing commitment to your health goals. Please review the following plan we discussed and let me know if I can assist you in the future. Your Game plan/ To Do List    Referrals: If you haven't heard from the office you've been referred to, please reach out to them at the phone provided.   Follow up Visits: We will see or speak with you next year for your Next Medicare AWV with our clinical staff Have you seen your provider in the last 6 months (3 months if uncontrolled diabetes)? No  Clinician Recommendations:  Aim for 30 minutes of exercise or brisk walking, 6-8 glasses of water, and 5 servings of fruits and vegetables each day.       This is a list of the screenings recommended for you:  Health Maintenance  Topic Date Due   Eye exam for diabetics  09/14/2020   COVID-19 Vaccine (4 - 2024-25 season) 05/04/2023   Flu Shot  05/03/2024*   Zoster (Shingles) Vaccine (1 of 2) 05/12/2024*   DTaP/Tdap/Td vaccine (1 - Tdap) 08/27/2024*   Pneumococcal Vaccine for age over 84 (2 of 2 - PCV) 02/09/2025*   Hemoglobin A1C  08/22/2024   Complete foot exam   08/27/2024   Yearly kidney health urinalysis for diabetes  02/09/2025   Yearly kidney function blood test for diabetes  02/26/2025   Medicare Annual Wellness Visit  04/21/2025   DEXA scan (bone density measurement)  Addressed   HPV Vaccine  Aged Out   Meningitis B Vaccine  Aged Out   Colon Cancer Screening  Discontinued   Hepatitis C Screening  Discontinued  *Topic was postponed. The date shown is not the original due date.    Advanced directives: (Declined) Advance directive discussed with you today. Even though you declined this today, please call our office should you change your mind, and we can  give you the proper paperwork for you to fill out. Advance Care Planning is important because it:  [x]  Makes sure you receive the medical care that is consistent with your values, goals, and preferences  [x]  It provides guidance to your family and loved ones and reduces their decisional burden about whether or not they are making the right decisions based on your wishes.  Follow the link provided in your after visit summary or read over the paperwork we have mailed to you to help you started getting your Advance Directives in place. If you need assistance in completing these, please reach out to us  so that we can help you!  See attachments for Preventive Care and Fall Prevention Tips.

## 2024-04-26 ENCOUNTER — Other Ambulatory Visit: Payer: Self-pay | Admitting: Family Medicine

## 2024-04-26 ENCOUNTER — Other Ambulatory Visit: Payer: Medicare HMO

## 2024-04-26 ENCOUNTER — Ambulatory Visit: Payer: Medicare HMO | Admitting: Oncology

## 2024-04-27 ENCOUNTER — Inpatient Hospital Stay (HOSPITAL_BASED_OUTPATIENT_CLINIC_OR_DEPARTMENT_OTHER): Admitting: Nurse Practitioner

## 2024-04-27 ENCOUNTER — Inpatient Hospital Stay

## 2024-04-27 ENCOUNTER — Encounter: Payer: Self-pay | Admitting: Nurse Practitioner

## 2024-04-27 VITALS — BP 142/77 | HR 70 | Temp 97.8°F | Resp 18 | Ht 64.0 in | Wt 111.9 lb

## 2024-04-27 DIAGNOSIS — D649 Anemia, unspecified: Secondary | ICD-10-CM | POA: Diagnosis not present

## 2024-04-27 DIAGNOSIS — D471 Chronic myeloproliferative disease: Secondary | ICD-10-CM

## 2024-04-27 DIAGNOSIS — D45 Polycythemia vera: Secondary | ICD-10-CM

## 2024-04-27 DIAGNOSIS — E119 Type 2 diabetes mellitus without complications: Secondary | ICD-10-CM | POA: Diagnosis not present

## 2024-04-27 DIAGNOSIS — Z8673 Personal history of transient ischemic attack (TIA), and cerebral infarction without residual deficits: Secondary | ICD-10-CM | POA: Diagnosis not present

## 2024-04-27 DIAGNOSIS — I10 Essential (primary) hypertension: Secondary | ICD-10-CM | POA: Diagnosis not present

## 2024-04-27 DIAGNOSIS — Z79899 Other long term (current) drug therapy: Secondary | ICD-10-CM | POA: Diagnosis not present

## 2024-04-27 LAB — CBC WITH DIFFERENTIAL (CANCER CENTER ONLY)
Abs Immature Granulocytes: 0.02 K/uL (ref 0.00–0.07)
Basophils Absolute: 0 K/uL (ref 0.0–0.1)
Basophils Relative: 0 %
Eosinophils Absolute: 0.1 K/uL (ref 0.0–0.5)
Eosinophils Relative: 2 %
HCT: 37.7 % (ref 36.0–46.0)
Hemoglobin: 12.4 g/dL (ref 12.0–15.0)
Immature Granulocytes: 0 %
Lymphocytes Relative: 28 %
Lymphs Abs: 1.6 K/uL (ref 0.7–4.0)
MCH: 35.9 pg — ABNORMAL HIGH (ref 26.0–34.0)
MCHC: 32.9 g/dL (ref 30.0–36.0)
MCV: 109.3 fL — ABNORMAL HIGH (ref 80.0–100.0)
Monocytes Absolute: 0.5 K/uL (ref 0.1–1.0)
Monocytes Relative: 8 %
Neutro Abs: 3.5 K/uL (ref 1.7–7.7)
Neutrophils Relative %: 62 %
Platelet Count: 436 K/uL — ABNORMAL HIGH (ref 150–400)
RBC: 3.45 MIL/uL — ABNORMAL LOW (ref 3.87–5.11)
RDW: 13.9 % (ref 11.5–15.5)
WBC Count: 5.7 K/uL (ref 4.0–10.5)
nRBC: 0 % (ref 0.0–0.2)

## 2024-04-27 NOTE — Progress Notes (Signed)
  [facility] OFFICE PROGRESS NOTE   Diagnosis: Polycythemia vera  INTERVAL HISTORY:   Brandi Mccormick returns for follow-up.  She resumed Hydrea  500 mg on a Monday Wednesday Friday schedule following her last visit.  No bleeding.  No symptom of thrombosis.  She denies nausea/vomiting.  No mouth sores.  No diarrhea.  No rash.  Objective:  Vital signs in last 24 hours:  Blood pressure (!) 142/77, pulse 70, temperature 97.8 F (36.6 C), temperature source Temporal, resp. rate 18, height 5' 4 (1.626 m), weight 111 lb 14.4 oz (50.8 kg), SpO2 100%.    HEENT: No thrush or ulcers. Resp: Lungs clear bilaterally. Cardio: Regular rate and rhythm. GI: No hepatosplenomegaly. Vascular: No leg edema.  Skin: No rash.   Lab Results:  Lab Results  Component Value Date   WBC 5.7 04/27/2024   HGB 12.4 04/27/2024   HCT 37.7 04/27/2024   MCV 109.3 (H) 04/27/2024   PLT 436 (H) 04/27/2024   NEUTROABS 3.5 04/27/2024    Imaging:  No results found.  Medications: I have reviewed the patient's current medications.  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) Hydroxyurea  500 mg twice daily 06/26/2018 Hydroxyurea  decreased to 500 mg daily beginning 07/27/2018 Hydroxyurea  placed on hold 08/17/2018 Hydroxyurea  resumed at a dose of 500 mg every other day beginning 08/31/2018 Hydrea  increased to 500 mg daily beginning 09/21/2018 Hydrea  dose adjusted to 500 mg Monday through Thursday, off Friday Saturday Sunday due to anemia beginning 01/23/2021 Hydroxyurea  adjusted to 500 mg daily 06/13/2023 secondary to a rise in the hemoglobin and platelet count Hydroxyurea  placed on hold 02/27/2024 due to anemia Hydrea  remains on hold 03/16/2024 due to persistent anemia Hydrea  remains on hold 03/31/2024 due to persistent anemia Hydrea  resumed at a dose of 500 mg Monday, Wednesday, and Friday, 04/19/2024 Type 2 diabetes Hypertension 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  She had orthostatic hypotension, improved with hydration.  Progressive anemia.  Hemoglobin remained stable in the hospital.  Disposition: Brandi Mccormick appears stable.  She resumed Hydrea  500 mg Monday Wednesday Friday a little over a week ago.  We reviewed the CBC from today.  Counts are stable.  She will continue Hydrea  at the current dose.  She will return for an office visit and lab the week of 05/17/2024.    Olam Ned ANP/GNP-BC   04/27/2024  1:20 PM

## 2024-05-13 ENCOUNTER — Telehealth: Payer: Self-pay | Admitting: Oncology

## 2024-05-13 NOTE — Telephone Encounter (Signed)
 Confirming appt for 9/18

## 2024-05-16 ENCOUNTER — Other Ambulatory Visit: Payer: Self-pay | Admitting: Family Medicine

## 2024-05-16 DIAGNOSIS — E1165 Type 2 diabetes mellitus with hyperglycemia: Secondary | ICD-10-CM

## 2024-05-20 ENCOUNTER — Inpatient Hospital Stay (HOSPITAL_BASED_OUTPATIENT_CLINIC_OR_DEPARTMENT_OTHER): Admitting: Nurse Practitioner

## 2024-05-20 ENCOUNTER — Inpatient Hospital Stay: Attending: Oncology

## 2024-05-20 ENCOUNTER — Encounter: Payer: Self-pay | Admitting: Nurse Practitioner

## 2024-05-20 VITALS — BP 139/74 | HR 68 | Temp 97.6°F | Resp 16 | Wt 112.5 lb

## 2024-05-20 DIAGNOSIS — D45 Polycythemia vera: Secondary | ICD-10-CM

## 2024-05-20 DIAGNOSIS — Z8673 Personal history of transient ischemic attack (TIA), and cerebral infarction without residual deficits: Secondary | ICD-10-CM | POA: Diagnosis not present

## 2024-05-20 DIAGNOSIS — D649 Anemia, unspecified: Secondary | ICD-10-CM | POA: Insufficient documentation

## 2024-05-20 DIAGNOSIS — E119 Type 2 diabetes mellitus without complications: Secondary | ICD-10-CM | POA: Diagnosis not present

## 2024-05-20 DIAGNOSIS — I10 Essential (primary) hypertension: Secondary | ICD-10-CM | POA: Diagnosis not present

## 2024-05-20 LAB — CBC WITH DIFFERENTIAL (CANCER CENTER ONLY)
Abs Immature Granulocytes: 0.02 K/uL (ref 0.00–0.07)
Basophils Absolute: 0 K/uL (ref 0.0–0.1)
Basophils Relative: 1 %
Eosinophils Absolute: 0.2 K/uL (ref 0.0–0.5)
Eosinophils Relative: 3 %
HCT: 40.9 % (ref 36.0–46.0)
Hemoglobin: 13.3 g/dL (ref 12.0–15.0)
Immature Granulocytes: 0 %
Lymphocytes Relative: 25 %
Lymphs Abs: 1.6 K/uL (ref 0.7–4.0)
MCH: 34.3 pg — ABNORMAL HIGH (ref 26.0–34.0)
MCHC: 32.5 g/dL (ref 30.0–36.0)
MCV: 105.4 fL — ABNORMAL HIGH (ref 80.0–100.0)
Monocytes Absolute: 0.6 K/uL (ref 0.1–1.0)
Monocytes Relative: 9 %
Neutro Abs: 3.9 K/uL (ref 1.7–7.7)
Neutrophils Relative %: 62 %
Platelet Count: 349 K/uL (ref 150–400)
RBC: 3.88 MIL/uL (ref 3.87–5.11)
RDW: 14.6 % (ref 11.5–15.5)
WBC Count: 6.3 K/uL (ref 4.0–10.5)
nRBC: 0 % (ref 0.0–0.2)

## 2024-05-20 NOTE — Progress Notes (Signed)
  [facility] OFFICE PROGRESS NOTE   Diagnosis: Polycythemia vera  INTERVAL HISTORY:   Ms. Alarid returns for follow-up.  She continues Hydrea  500 mg Monday/Wednesday/Friday.  She denies nausea/vomiting.  No mouth sores.  No diarrhea.  No rash.  No bleeding.  No symptom of thrombosis.  She occasionally feels hot.  No fever or sweats.  Objective:  Vital signs in last 24 hours:  Blood pressure 139/74, pulse 68, temperature 97.6 F (36.4 C), temperature source Temporal, resp. rate 16, weight 112 lb 8 oz (51 kg), SpO2 100%.    HEENT: No thrush or ulcers. Resp: Lungs clear bilaterally. Cardio: Regular rate and rhythm. GI: No hepatosplenomegaly. Vascular: No leg edema.  Calves soft and nontender. Neuro: Alert and oriented. Skin: No rash.   Lab Results:  Lab Results  Component Value Date   WBC 6.3 05/20/2024   HGB 13.3 05/20/2024   HCT 40.9 05/20/2024   MCV 105.4 (H) 05/20/2024   PLT 349 05/20/2024   NEUTROABS 3.9 05/20/2024    Imaging:  No results found.  Medications: I have reviewed the patient's current medications.  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) Hydroxyurea  500 mg twice daily 06/26/2018 Hydroxyurea  decreased to 500 mg daily beginning 07/27/2018 Hydroxyurea  placed on hold 08/17/2018 Hydroxyurea  resumed at a dose of 500 mg every other day beginning 08/31/2018 Hydrea  increased to 500 mg daily beginning 09/21/2018 Hydrea  dose adjusted to 500 mg Monday through Thursday, off Friday Saturday Sunday due to anemia beginning 01/23/2021 Hydroxyurea  adjusted to 500 mg daily 06/13/2023 secondary to a rise in the hemoglobin and platelet count Hydroxyurea  placed on hold 02/27/2024 due to anemia Hydrea  remains on hold 03/16/2024 due to persistent anemia Hydrea  remains on hold 03/31/2024 due to persistent anemia Hydrea  resumed at a dose of 500 mg Monday, Wednesday, and Friday, 04/19/2024 Type 2 diabetes Hypertension Focal/segmental  glomerular sclerosis Admission 10/06/2020 with a posterior circulation CVA, started on full dose aspirin  and Plavix  Admission 02/21/2024 - 02/22/2024 following episode of near syncope/syncope.  She had orthostatic hypotension, improved with hydration.  Progressive anemia.  Hemoglobin remained stable in the hospital.    Disposition: Ms. Probert appears stable.  She continues hydroxyurea  500 mg on a Monday, Wednesday, Friday schedule.  She is tolerating well.  We reviewed the CBC from today.  Platelet count is in goal range, hemoglobin slightly higher.  She will continue the current dose of hydroxyurea  for now return for repeat CBC in 4 weeks.  Lab and office visit in 8 weeks.  We are available to see her sooner if needed.    Olam Ned ANP/GNP-BC   05/20/2024  10:41 AM

## 2024-05-28 ENCOUNTER — Ambulatory Visit (INDEPENDENT_AMBULATORY_CARE_PROVIDER_SITE_OTHER): Admitting: Family Medicine

## 2024-05-28 ENCOUNTER — Encounter: Payer: Self-pay | Admitting: Family Medicine

## 2024-05-28 VITALS — BP 100/80 | HR 66 | Temp 97.9°F | Resp 18 | Ht 64.0 in | Wt 113.6 lb

## 2024-05-28 DIAGNOSIS — E1165 Type 2 diabetes mellitus with hyperglycemia: Secondary | ICD-10-CM

## 2024-05-28 DIAGNOSIS — E785 Hyperlipidemia, unspecified: Secondary | ICD-10-CM | POA: Diagnosis not present

## 2024-05-28 DIAGNOSIS — E1159 Type 2 diabetes mellitus with other circulatory complications: Secondary | ICD-10-CM | POA: Diagnosis not present

## 2024-05-28 DIAGNOSIS — I152 Hypertension secondary to endocrine disorders: Secondary | ICD-10-CM

## 2024-05-28 DIAGNOSIS — Z7984 Long term (current) use of oral hypoglycemic drugs: Secondary | ICD-10-CM | POA: Diagnosis not present

## 2024-05-28 DIAGNOSIS — Z8673 Personal history of transient ischemic attack (TIA), and cerebral infarction without residual deficits: Secondary | ICD-10-CM

## 2024-05-28 DIAGNOSIS — N1832 Chronic kidney disease, stage 3b: Secondary | ICD-10-CM

## 2024-05-28 DIAGNOSIS — I10 Essential (primary) hypertension: Secondary | ICD-10-CM | POA: Diagnosis not present

## 2024-05-28 DIAGNOSIS — E1169 Type 2 diabetes mellitus with other specified complication: Secondary | ICD-10-CM | POA: Diagnosis not present

## 2024-05-28 LAB — MICROALBUMIN / CREATININE URINE RATIO
Creatinine,U: 73.4 mg/dL
Microalb Creat Ratio: 90.3 mg/g — ABNORMAL HIGH (ref 0.0–30.0)
Microalb, Ur: 6.6 mg/dL — ABNORMAL HIGH (ref 0.0–1.9)

## 2024-05-28 LAB — LIPID PANEL
Cholesterol: 127 mg/dL (ref 0–200)
HDL: 54.4 mg/dL (ref >39.00)
LDL Cholesterol: 61 mg/dL (ref 0–99)
NonHDL: 72.13
Total CHOL/HDL Ratio: 2
Triglycerides: 56 mg/dL (ref 0.0–149.0)
VLDL: 11.2 mg/dL (ref 0.0–40.0)

## 2024-05-28 LAB — COMPREHENSIVE METABOLIC PANEL WITH GFR
ALT: 21 U/L (ref 0–35)
AST: 20 U/L (ref 0–37)
Albumin: 4.1 g/dL (ref 3.5–5.2)
Alkaline Phosphatase: 60 U/L (ref 39–117)
BUN: 25 mg/dL — ABNORMAL HIGH (ref 6–23)
CO2: 28 meq/L (ref 19–32)
Calcium: 9.3 mg/dL (ref 8.4–10.5)
Chloride: 109 meq/L (ref 96–112)
Creatinine, Ser: 1.59 mg/dL — ABNORMAL HIGH (ref 0.40–1.20)
GFR: 30.04 mL/min — ABNORMAL LOW (ref >60.00)
Glucose, Bld: 107 mg/dL — ABNORMAL HIGH (ref 70–99)
Potassium: 4.1 meq/L (ref 3.5–5.1)
Sodium: 142 meq/L (ref 135–145)
Total Bilirubin: 0.7 mg/dL (ref 0.2–1.2)
Total Protein: 6.5 g/dL (ref 6.0–8.3)

## 2024-05-28 LAB — CBC WITH DIFFERENTIAL/PLATELET
Basophils Absolute: 0 K/uL (ref 0.0–0.1)
Basophils Relative: 0.3 % (ref 0.0–3.0)
Eosinophils Absolute: 0.1 K/uL (ref 0.0–0.7)
Eosinophils Relative: 2.8 % (ref 0.0–5.0)
HCT: 43.1 % (ref 36.0–46.0)
Hemoglobin: 14 g/dL (ref 12.0–15.0)
Lymphocytes Relative: 27 % (ref 12.0–46.0)
Lymphs Abs: 1.3 K/uL (ref 0.7–4.0)
MCHC: 32.4 g/dL (ref 30.0–36.0)
MCV: 102.5 fl — ABNORMAL HIGH (ref 78.0–100.0)
Monocytes Absolute: 0.5 K/uL (ref 0.1–1.0)
Monocytes Relative: 9.1 % (ref 3.0–12.0)
Neutro Abs: 3 K/uL (ref 1.4–7.7)
Neutrophils Relative %: 60.8 % (ref 43.0–77.0)
Platelets: 344 K/uL (ref 150.0–400.0)
RBC: 4.2 Mil/uL (ref 3.87–5.11)
RDW: 16.7 % — ABNORMAL HIGH (ref 11.5–15.5)
WBC: 4.9 K/uL (ref 4.0–10.5)

## 2024-05-28 LAB — HEMOGLOBIN A1C: Hgb A1c MFr Bld: 6.7 % — ABNORMAL HIGH (ref 4.6–6.5)

## 2024-05-28 NOTE — Assessment & Plan Note (Addendum)
 Stable  No new symptoms  On aspirin 

## 2024-05-28 NOTE — Progress Notes (Signed)
 +   Subjective:    Patient ID: Brandi Mccormick, female    DOB: 1941-07-16, 83 y.o.   MRN: [id number]  Chief Complaint  Patient presents with   Hypertension   Hyperlipidemia   Diabetes   Follow-up    HPI Patient is in today for f/u dm, chol and htn.   Discussed the use of AI scribe software for clinical note transcription with the patient, who gave verbal consent to proceed.  History of Present Illness Brandi Mccormick is an 83 year old female who presents for a routine follow-up visit.  Her blood sugars are well-controlled, and she checks them regularly. She continues to see a hematologist at [facility].  She experiences occasional hot flashes, which she describes as 'hot trash'. These episodes are brief and not severe.  She declines receiving a flu shot and a shingles shot, noting she received the old version of the vaccine many years ago. She has not been to the eye doctor recently and does not currently get mammograms or bone density tests.   Past Medical History:  Diagnosis Date   CVA (cerebral vascular accident) (HCC) 10/2020   Diabetes (HCC)    GERD (gastroesophageal reflux disease)    Hyperlipidemia    Hypertension    Hyperthyroidism    Pneumonia    Polycythemia    Shingles     Past Surgical History:  Procedure Laterality Date   CATARACT EXTRACTION Left 2020   COLONOSCOPY     DENTAL SURGERY     IR ANGIO INTRA EXTRACRAN SEL COM CAROTID INNOMINATE BILAT MOD SED  10/09/2020   IR ANGIO VERTEBRAL SEL VERTEBRAL BILAT MOD SED  10/09/2020    Family History  Problem Relation Age of Onset   Diabetes Mother        and on mother's side of family   Colon cancer Mother    Stroke Mother    Diabetes Father        and on father's side of family   Diabetes Sister    Diabetes Sister    Diabetes Brother    Heart disease Neg Hx    Rectal cancer Neg Hx    Stomach cancer Neg Hx     Social History   Socioeconomic History   Marital status: Married    Spouse name: Not  on file   Number of children: Not on file   Years of education: Not on file   Highest education level: Not on file  Occupational History   Occupation: [employer]--    Comment: retired  Tobacco Use   Smoking status: Never   Smokeless tobacco: Never  Vaping Use   Vaping status: Never Used  Substance and Sexual Activity   Alcohol use: Yes    Alcohol/week: 0.0 standard drinks of alcohol    Comment: occasional wine cooler   Drug use: No   Sexual activity: Not Currently    Partners: Male  Other Topics Concern   Not on file  Social History Narrative   Exercise-- yard work,  Walking in [employer]   03/15/21 lives with husband   Social Drivers of Corporate investment banker Strain: Low Risk  (04/21/2024)   Overall Financial Resource Strain (CARDIA)    Difficulty of Paying Living Expenses: Not hard at all  Food Insecurity: No Food Insecurity (04/21/2024)   Hunger Vital Sign    Worried About Running Out of Food in the Last Year: Never true    Ran Out of Food in the Last  Year: Never true  Transportation Needs: No Transportation Needs (04/21/2024)   PRAPARE - Administrator, Civil Service (Medical): No    Lack of Transportation (Non-Medical): No  Physical Activity: Insufficiently Active (04/21/2024)   Exercise Vital Sign    Days of Exercise per Week: 2 days    Minutes of Exercise per Session: 30 min  Stress: No Stress Concern Present (04/21/2024)   [employer] of Occupational Health - Occupational Stress Questionnaire    Feeling of Stress: Not at all  Social Connections: Moderately Isolated (04/21/2024)   Social Connection and Isolation Panel    Frequency of Communication with Friends and Family: Three times a week    Frequency of Social Gatherings with Friends and Family: Once a week    Attends Religious Services: Never    Database administrator or Organizations: No    Attends Banker Meetings: Never    Marital Status: Married  Catering manager Violence: Not At  Risk (04/21/2024)   Humiliation, Afraid, Rape, and Kick questionnaire    Fear of Current or Ex-Partner: No    Emotionally Abused: No    Physically Abused: No    Sexually Abused: No    Outpatient Medications Prior to Visit  Medication Sig Dispense Refill   aspirin  EC 81 MG tablet Take 81 mg by mouth daily. Swallow whole.     atorvastatin  (LIPITOR) 80 MG tablet Take 1 tablet by mouth once daily 90 tablet 0   Blood Glucose Monitoring Suppl (TRUE METRIX METER) w/Device KIT Use as advised 1 kit 0   empagliflozin  (JARDIANCE ) 10 MG TABS tablet Take 1 tablet (10 mg total) by mouth daily before breakfast. 90 tablet 0   ezetimibe  (ZETIA ) 10 MG tablet Take 1 tablet by mouth once daily 90 tablet 1   glipiZIDE  (GLUCOTROL  XL) 5 MG 24 hr tablet Take 2 tablets (10 mg total) by mouth daily. 180 tablet 0   glucose blood (TRUE METRIX BLOOD GLUCOSE TEST) test strip Use as instructed 2x a day 200 each 3   hydroxyurea  (HYDREA ) 500 MG capsule Take 1 capsule (500 mg total) by mouth daily. May take with food to minimize GI side effects. (Patient taking differently: Take 500 mg by mouth every Monday, Wednesday, and Friday. May take with food to minimize GI side effects.) 90 capsule 1   lisinopril  (ZESTRIL ) 20 MG tablet Take 1 tablet (20 mg total) by mouth daily. 90 tablet 1   metoprolol  succinate (TOPROL -XL) 25 MG 24 hr tablet TAKE 1 TABLET BY MOUTH ONCE DAILY WITH MEALS 90 tablet 0   pantoprazole  (PROTONIX ) 40 MG tablet Take 1 tablet by mouth once daily 90 tablet 0   No facility-administered medications prior to visit.    Allergies  Allergen Reactions   Penicillins Nausea And Vomiting    Review of Systems  Constitutional:  Negative for chills, fever and malaise/fatigue.  HENT:  Negative for congestion and hearing loss.   Eyes:  Negative for blurred vision and discharge.  Respiratory:  Negative for cough, sputum production and shortness of breath.   Cardiovascular:  Negative for chest pain, palpitations and  leg swelling.  Gastrointestinal:  Negative for abdominal pain, blood in stool, constipation, diarrhea, heartburn, nausea and vomiting.  Genitourinary:  Negative for dysuria, frequency, hematuria and urgency.  Musculoskeletal:  Negative for back pain, falls and myalgias.  Skin:  Negative for rash.  Neurological:  Negative for dizziness, sensory change, loss of consciousness, weakness and headaches.  Endo/Heme/Allergies:  Negative for  environmental allergies. Does not bruise/bleed easily.  Psychiatric/Behavioral:  Negative for depression and suicidal ideas. The patient is not nervous/anxious and does not have insomnia.        Objective:    Physical Exam Vitals and nursing note reviewed.  Constitutional:      General: She is not in acute distress.    Appearance: Normal appearance. She is well-developed.  HENT:     Head: Normocephalic and atraumatic.  Eyes:     General: No scleral icterus.       Right eye: No discharge.        Left eye: No discharge.  Cardiovascular:     Rate and Rhythm: Normal rate and regular rhythm.     Heart sounds: No murmur heard. Pulmonary:     Effort: Pulmonary effort is normal. No respiratory distress.     Breath sounds: Normal breath sounds.  Musculoskeletal:        General: Normal range of motion.     Cervical back: Normal range of motion and neck supple.     Right lower leg: No edema.     Left lower leg: No edema.  Skin:    General: Skin is warm and dry.  Neurological:     Mental Status: She is alert and oriented to person, place, and time.  Psychiatric:        Mood and Affect: Mood normal.        Behavior: Behavior normal.        Thought Content: Thought content normal.        Judgment: Judgment normal.     BP 100/80 (BP Location: Left Arm, Patient Position: Sitting, Cuff Size: Normal)   Pulse 66   Temp 97.9 F (36.6 C) (Oral)   Resp 18   Ht 5' 4 (1.626 m)   Wt 113 lb 9.6 oz (51.5 kg)   SpO2 96%   BMI 19.50 kg/m  Wt Readings from  Last 3 Encounters:  05/28/24 113 lb 9.6 oz (51.5 kg)  05/20/24 112 lb 8 oz (51 kg)  04/27/24 111 lb 14.4 oz (50.8 kg)    Diabetic Foot Exam - Simple   No data filed    Lab Results  Component Value Date   WBC 6.3 05/20/2024   HGB 13.3 05/20/2024   HCT 40.9 05/20/2024   PLT 349 05/20/2024   GLUCOSE 141 (H) 02/27/2024   CHOL 142 02/10/2024   TRIG 97.0 02/10/2024   HDL 47.40 02/10/2024   LDLCALC 75 02/10/2024   ALT 25 02/21/2024   AST 28 02/21/2024   NA 140 02/27/2024   K 3.7 02/27/2024   CL 110 02/27/2024   CREATININE 1.74 (H) 02/27/2024   BUN 25 (H) 02/27/2024   CO2 20 (L) 02/27/2024   TSH 0.73 08/28/2023   INR 1.0 02/21/2024   HGBA1C 11.9 (H) 02/21/2024   MICROALBUR 4.7 (H) 02/10/2024    Lab Results  Component Value Date   TSH 0.73 08/28/2023   Lab Results  Component Value Date   WBC 6.3 05/20/2024   HGB 13.3 05/20/2024   HCT 40.9 05/20/2024   MCV 105.4 (H) 05/20/2024   PLT 349 05/20/2024   Lab Results  Component Value Date   NA 140 02/27/2024   K 3.7 02/27/2024   CO2 20 (L) 02/27/2024   GLUCOSE 141 (H) 02/27/2024   BUN 25 (H) 02/27/2024   CREATININE 1.74 (H) 02/27/2024   BILITOT 1.2 02/21/2024   ALKPHOS 49 02/21/2024   AST 28 02/21/2024  ALT 25 02/21/2024   PROT 6.5 02/21/2024   ALBUMIN 3.5 02/21/2024   CALCIUM  9.2 02/27/2024   ANIONGAP 11 02/27/2024   GFR 28.79 (L) 02/26/2024   Lab Results  Component Value Date   CHOL 142 02/10/2024   Lab Results  Component Value Date   HDL 47.40 02/10/2024   Lab Results  Component Value Date   LDLCALC 75 02/10/2024   Lab Results  Component Value Date   TRIG 97.0 02/10/2024   Lab Results  Component Value Date   CHOLHDL 3 02/10/2024   Lab Results  Component Value Date   HGBA1C 11.9 (H) 02/21/2024       Assessment & Plan:  Uncontrolled type 2 diabetes mellitus with hyperglycemia (HCC) Assessment & Plan: Check labs today Lab Results  Component Value Date   HGBA1C 11.9 (H) 02/21/2024    Cont jardiance  and glipizide   Orders: -     Hemoglobin A1c -     Microalbumin / creatinine urine ratio  Hypertension associated with diabetes (HCC) Assessment & Plan: Well controlled, no changes to meds. Encouraged heart healthy diet such as the DASH diet and exercise as tolerated.    Orders: -     Comprehensive metabolic panel with GFR  Hyperlipidemia associated with type 2 diabetes mellitus (HCC) Assessment & Plan: Tolerating statin, encouraged heart healthy diet, avoid trans fats, minimize simple carbs and saturated fats. Increase exercise as tolerated   Orders: -     Lipid panel -     CBC with Differential/Platelet  Essential hypertension -     CBC with Differential/Platelet -     Comprehensive metabolic panel with GFR  Chronic kidney disease, stage 3b (HCC) Assessment & Plan: Check labs Per [facility]   History of cerebrovascular accident (CVA) in adulthood Assessment & Plan: Stable  No new symptoms  On aspirin    Assessment and Plan Assessment & Plan Type 2 diabetes mellitus   Type 2 diabetes mellitus is well-managed with good blood sugar levels.  Essential hypertension  General Health Maintenance   She declined the flu shot and shingles vaccine. She has not had recent eye exams, mammograms, or bone density tests.    Harlee Eckroth R Lowne Chase, DO

## 2024-05-28 NOTE — Assessment & Plan Note (Signed)
 Check labs today Lab Results  Component Value Date   HGBA1C 11.9 (H) 02/21/2024   Cont jardiance  and glipizide 

## 2024-05-28 NOTE — Assessment & Plan Note (Signed)
 Well controlled, no changes to meds. Encouraged heart healthy diet such as the DASH diet and exercise as tolerated.

## 2024-05-28 NOTE — Assessment & Plan Note (Signed)
 Tolerating statin, encouraged heart healthy diet, avoid trans fats, minimize simple carbs and saturated fats. Increase exercise as tolerated

## 2024-05-28 NOTE — Assessment & Plan Note (Signed)
 Check labs Per [facility]

## 2024-05-28 NOTE — Patient Instructions (Signed)

## 2024-05-30 ENCOUNTER — Ambulatory Visit: Payer: Self-pay | Admitting: Family Medicine

## 2024-06-01 DIAGNOSIS — E1165 Type 2 diabetes mellitus with hyperglycemia: Secondary | ICD-10-CM | POA: Diagnosis not present

## 2024-06-08 DIAGNOSIS — E785 Hyperlipidemia, unspecified: Secondary | ICD-10-CM | POA: Diagnosis not present

## 2024-06-08 DIAGNOSIS — N1832 Chronic kidney disease, stage 3b: Secondary | ICD-10-CM | POA: Diagnosis not present

## 2024-06-08 DIAGNOSIS — Z8673 Personal history of transient ischemic attack (TIA), and cerebral infarction without residual deficits: Secondary | ICD-10-CM | POA: Diagnosis not present

## 2024-06-08 DIAGNOSIS — E1165 Type 2 diabetes mellitus with hyperglycemia: Secondary | ICD-10-CM | POA: Diagnosis not present

## 2024-06-08 DIAGNOSIS — I10 Essential (primary) hypertension: Secondary | ICD-10-CM | POA: Diagnosis not present

## 2024-06-09 ENCOUNTER — Ambulatory Visit: Admitting: Podiatry

## 2024-06-09 ENCOUNTER — Encounter: Payer: Self-pay | Admitting: Podiatry

## 2024-06-09 DIAGNOSIS — M79675 Pain in left toe(s): Secondary | ICD-10-CM | POA: Diagnosis not present

## 2024-06-09 DIAGNOSIS — M79674 Pain in right toe(s): Secondary | ICD-10-CM

## 2024-06-09 DIAGNOSIS — B351 Tinea unguium: Secondary | ICD-10-CM | POA: Diagnosis not present

## 2024-06-09 DIAGNOSIS — E119 Type 2 diabetes mellitus without complications: Secondary | ICD-10-CM | POA: Diagnosis not present

## 2024-06-09 NOTE — Progress Notes (Signed)
  Subjective:  Patient ID: Brandi Mccormick, female    DOB: [birth year],  MRN: [id number]  Brandi Mccormick presents to clinic today for preventative diabetic foot care for painful mycotic toenails x 10 which interfere with daily activities. Pain is relieved with periodic professional debridement.  Chief Complaint  Patient presents with   Diabetes    DFC NIDDM A1C 6. Toenail trim. LOV with PCP 05/28/24.   New problem(s): None.   PCP is Brandi Mccormick, [place] R, DO.  Allergies  Allergen Reactions   Penicillins Nausea And Vomiting    Review of Systems: Negative except as noted in the HPI.  Objective: No changes noted in today's physical examination. There were no vitals filed for this visit. Brandi Mccormick is a pleasant 83 y.o. female WD, WN in NAD. AAO x 3.  Vascular Examination: CFT <3 seconds b/l. DP/PT pulses faintly palpable b/l. Digital hair absent.  Skin temperature gradient warm to warm b/l. No pain with calf compression. No ischemia or gangrene. No cyanosis or clubbing noted b/l. No edema noted b/l LE.   Neurological Examination: Sensation grossly intact b/l with 10 gram monofilament. Vibratory sensation intact b/l.   Dermatological Examination: Pedal skin warm and supple b/l.   No open wounds. No interdigital macerations.  Toenails 1-5 b/l thick, discolored, elongated with subungual debris and pain on dorsal palpation.    No hyperkeratotic nor porokeratotic lesions.  Musculoskeletal Examination: Muscle strength 5/5 to all lower extremity muscle groups bilaterally. Hammertoe(s) 2-5 b/l. Pes planus deformity noted bilateral LE.  Radiographs: None  Last A1c:      Latest Ref Rng & Units 05/28/2024    9:39 AM 02/21/2024    6:37 PM 02/10/2024   10:06 AM 11/04/2023    9:04 AM 07/04/2023   12:00 AM  Hemoglobin A1C  Hemoglobin-A1c 4.6 - 6.5 % 6.7  11.9  12.7  11.2  9.3         This result is from an external source.    Assessment/Plan: 1. Pain due to onychomycosis of  toenails of both feet   2. Controlled type 2 diabetes mellitus without complication, without long-term current use of insulin  [facility])     Patient was evaluated and treated. All patient's and/or POA's questions/concerns addressed on today's visit. Mycotic toenails 1-5 debrided in length and girth without incident.  Continue daily foot inspections and monitor blood glucose per PCP/Endocrinologist's recommendations.Continue soft, supportive shoe gear daily. Report any pedal injuries to medical professional. Call office if there are any quesitons/concerns. -Patient/POA to call should there be question/concern in the interim.   Return in about 3 months (around 09/09/2024).  Delon LITTIE Merlin, DPM      [facility] LOCATION: [address] [place] [address]                   Office [phone]   [facility] LOCATION: [address] [place], [place] [address] Office [phone]

## 2024-06-13 ENCOUNTER — Encounter: Payer: Self-pay | Admitting: Podiatry

## 2024-06-18 ENCOUNTER — Inpatient Hospital Stay: Attending: Oncology

## 2024-06-18 ENCOUNTER — Telehealth: Payer: Self-pay

## 2024-06-18 DIAGNOSIS — D45 Polycythemia vera: Secondary | ICD-10-CM | POA: Insufficient documentation

## 2024-06-18 LAB — CBC WITH DIFFERENTIAL (CANCER CENTER ONLY)
Abs Immature Granulocytes: 0.01 K/uL (ref 0.00–0.07)
Basophils Absolute: 0 K/uL (ref 0.0–0.1)
Basophils Relative: 1 %
Eosinophils Absolute: 0.2 K/uL (ref 0.0–0.5)
Eosinophils Relative: 4 %
HCT: 42.8 % (ref 36.0–46.0)
Hemoglobin: 13.7 g/dL (ref 12.0–15.0)
Immature Granulocytes: 0 %
Lymphocytes Relative: 24 %
Lymphs Abs: 1.4 K/uL (ref 0.7–4.0)
MCH: 31.9 pg (ref 26.0–34.0)
MCHC: 32 g/dL (ref 30.0–36.0)
MCV: 99.8 fL (ref 80.0–100.0)
Monocytes Absolute: 0.5 K/uL (ref 0.1–1.0)
Monocytes Relative: 9 %
Neutro Abs: 3.6 K/uL (ref 1.7–7.7)
Neutrophils Relative %: 62 %
Platelet Count: 326 K/uL (ref 150–400)
RBC: 4.29 MIL/uL (ref 3.87–5.11)
RDW: 14.4 % (ref 11.5–15.5)
WBC Count: 5.8 K/uL (ref 4.0–10.5)
nRBC: 0 % (ref 0.0–0.2)

## 2024-06-18 NOTE — Telephone Encounter (Signed)
 Patient gave verbal understanding and had no further questions or concerns

## 2024-06-18 NOTE — Telephone Encounter (Signed)
-----   Message from Olam Ned sent at 06/18/2024 11:46 AM EDT ----- Please let her know labs look good.  Continue Hydrea  as she is currently taking.  Follow-up as scheduled.

## 2024-06-22 ENCOUNTER — Other Ambulatory Visit: Payer: Self-pay | Admitting: *Deleted

## 2024-06-22 DIAGNOSIS — E1165 Type 2 diabetes mellitus with hyperglycemia: Secondary | ICD-10-CM

## 2024-06-22 MED ORDER — GLIPIZIDE ER 5 MG PO TB24
10.0000 mg | ORAL_TABLET | Freq: Every day | ORAL | 1 refills | Status: AC
Start: 2024-06-22 — End: ?

## 2024-06-23 DIAGNOSIS — Z008 Encounter for other general examination: Secondary | ICD-10-CM | POA: Diagnosis not present

## 2024-06-28 ENCOUNTER — Ambulatory Visit: Admitting: Neurology

## 2024-06-28 ENCOUNTER — Encounter: Payer: Self-pay | Admitting: Neurology

## 2024-06-28 VITALS — BP 152/79 | HR 70 | Ht 64.0 in | Wt 114.8 lb

## 2024-06-28 DIAGNOSIS — I63219 Cerebral infarction due to unspecified occlusion or stenosis of unspecified vertebral arteries: Secondary | ICD-10-CM | POA: Diagnosis not present

## 2024-06-28 DIAGNOSIS — R251 Tremor, unspecified: Secondary | ICD-10-CM | POA: Diagnosis not present

## 2024-06-28 DIAGNOSIS — G969 Disorder of central nervous system, unspecified: Secondary | ICD-10-CM

## 2024-06-28 DIAGNOSIS — I639 Cerebral infarction, unspecified: Secondary | ICD-10-CM

## 2024-06-28 DIAGNOSIS — Z8673 Personal history of transient ischemic attack (TIA), and cerebral infarction without residual deficits: Secondary | ICD-10-CM

## 2024-06-28 NOTE — Progress Notes (Addendum)
 [facility] [address] [place]. [place] [address] [phone]       OFFICE FOLLOW-UP NOTE  [name] Date of Birth:  December 06, 1940 Medical Record Number:  [id number]   HPI: [name] is a 83 year old pleasant African-American lady seen today for office follow-up visit following last office visit with Harlene nurse practitioner on 09/24/2021.  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  She is referred today for evaluation by her primary care physician Dr. Cyndee for eval evaluation for new complaints of right hand tremor.  Patient states that she has noticed new tremor involving mostly the right upper extremity for the last 3 months or so.  The tremor is absent at rest.  It is noticed mostly when she is trying to use her right hand to do activities like holding a cup of water or holding a magazine or newspaper.  The tremor is frustrating but she thinks it is not severe enough and not interfering with her daily routine.  She does not want to try medications at this time.  She is able to suppress her tremor if she supports her hand.  She is still able to sign checks and write.  She is unable to manage her specific triggers for these.  She is not sure if alcohol can calm this down or not.  She denies any family history of essential tremor.  She denies any drooling of saliva, bradykinesia, stooped posture, difficulty with walking or frequent falls.  She was recently admitted for 2 days on 02/21/2024 for episode of passing out.  She returned home from grocery store and after walking into the house in the carport when she sat down and husband noticed that she slumped forward with eyes rolling back.  She never closed her eyes fully.  This lasted only a few seconds according to the husband.  The patient was not able to recall the events  after this.  Upon evaluation in the emergency room she was found to be hemodynamically stable with only mild hyperglycemia.  Troponins were negative and EKG showed no acute changes.  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  Patient is presently on aspirin  she is tolerating well without bruising or bleeding and Lipitor 80 mg daily.  Lab work on 05/28/2024 showed LDL cholesterol to be optimal at 61 mg percent hemoglobin A1c 6.7.  She denies any recurrent stroke or TIA symptoms. ROS:   14 system review of systems is positive for tremor, bruising, difficulty using hand, near passing out, lightheadedness, dizziness and all other systems negative  PMH:  Past Medical History:  Diagnosis Date   CVA (cerebral vascular accident) (HCC) 10/2020   Diabetes (HCC)    GERD (gastroesophageal reflux disease)    Hyperlipidemia    Hypertension    Hyperthyroidism    Pneumonia    Polycythemia    Shingles  Social History:  Social History   Socioeconomic History   Marital status: Married    Spouse name: Not on file   Number of children: Not on file   Years of education: Not on file   Highest education level: Not on file  Occupational History   Occupation: [employer]--    Comment: retired  Tobacco Use   Smoking status: Never   Smokeless tobacco: Never  Vaping Use   Vaping status: Never Used  Substance and Sexual Activity   Alcohol use:  Yes    Alcohol/week: 0.0 standard drinks of alcohol    Comment: occasional wine cooler   Drug use: No   Sexual activity: Not Currently    Partners: Male  Other Topics Concern   Not on file  Social History Narrative   Exercise-- yard work,  Walking in [employer]   03/15/21 lives with husband   Social Drivers of Corporate Investment Banker Strain: Low Risk  (04/21/2024)   Overall Financial Resource Strain (CARDIA)    Difficulty of Paying Living Expenses: Not hard at all  Food Insecurity: No Food Insecurity (04/21/2024)   Hunger Vital Sign    Worried About Running Out of Food in the Last Year: Never true    Ran Out of Food in the Last Year: Never true  Transportation Needs: No Transportation Needs (04/21/2024)   PRAPARE - Administrator, Civil Service (Medical): No    Lack of Transportation (Non-Medical): No  Physical Activity: Insufficiently Active (04/21/2024)   Exercise Vital Sign    Days of Exercise per Week: 2 days    Minutes of Exercise per Session: 30 min  Stress: No Stress Concern Present (04/21/2024)   [employer] of Occupational Health - Occupational Stress Questionnaire    Feeling of Stress: Not at all  Social Connections: Moderately Isolated (04/21/2024)   Social Connection and Isolation Panel    Frequency of Communication with Friends and Family: Three times a week    Frequency of Social Gatherings with Friends and Family: Once a week    Attends Religious Services: Never    Database Administrator or Organizations: No    Attends Banker Meetings: Never    Marital Status: Married  Catering Manager Violence: Not At Risk (04/21/2024)   Humiliation, Afraid, Rape, and Kick questionnaire    Fear of Current or Ex-Partner: No    Emotionally Abused: No    Physically Abused: No    Sexually Abused: No    Medications:   Current Outpatient Medications on File Prior to Visit  Medication Sig Dispense Refill   aspirin  EC 81 MG tablet Take 81 mg by mouth  daily. Swallow whole.     atorvastatin  (LIPITOR) 80 MG tablet Take 1 tablet by mouth once daily 90 tablet 0   Blood Glucose Monitoring Suppl (TRUE METRIX METER) w/Device KIT Use as advised 1 kit 0   empagliflozin  (JARDIANCE ) 10 MG TABS tablet Take 1 tablet (10 mg total) by mouth daily before breakfast. 90 tablet 0   ezetimibe  (ZETIA ) 10 MG tablet Take 1 tablet by mouth once daily 90 tablet 1   glipiZIDE  (GLUCOTROL  XL) 5 MG 24 hr tablet Take 2 tablets (10 mg total) by mouth daily. 180 tablet 1   glucose blood (TRUE METRIX BLOOD GLUCOSE TEST) test strip Use as instructed 2x a day 200 each 3   hydroxyurea  (HYDREA ) 500 MG capsule Take 1 capsule (500 mg total) by mouth daily. May take with food to  minimize GI side effects. (Patient taking differently: Take 500 mg by mouth every Monday, Wednesday, and Friday. May take with food to minimize GI side effects.) 90 capsule 1   lisinopril  (ZESTRIL ) 20 MG tablet Take 1 tablet (20 mg total) by mouth daily. 90 tablet 1   metoprolol  succinate (TOPROL -XL) 25 MG 24 hr tablet TAKE 1 TABLET BY MOUTH ONCE DAILY WITH MEALS 90 tablet 0   pantoprazole  (PROTONIX ) 40 MG tablet Take 1 tablet by mouth once daily 90 tablet 0   No current facility-administered medications on file prior to visit.    Allergies:   Allergies  Allergen Reactions   Penicillins Nausea And Vomiting    Physical Exam General: well developed, well nourished, seated, in no evident distress Head: head normocephalic and atraumatic.  Neck: supple with no carotid or supraclavicular bruits Cardiovascular: regular rate and rhythm, no murmurs Musculoskeletal: no deformity Skin:  no rash/petichiae Vascular:  Normal pulses all extremities Vitals:   06/28/24 0918  BP: (!) 152/79  Pulse: 70  SpO2: 99%   Neurologic Exam Mental Status: Awake and fully alert. Oriented to place and time. Recent and remote memory intact. Attention span, concentration and fund of knowledge appropriate. Mood and affect  appropriate.  Glabellar tap is negative.  Good facial expression.  MMSE 26/30.  Clock drawing 4/4.  Able to name 5 animals which can walk on forelegs.  Able to copy intersecting pentagons.  Geriatric depression scale only 1 not depressed. Cranial Nerves: Fundoscopic exam reveals sharp disc margins. Pupils equal, briskly reactive to light. Extraocular movements full without nystagmus. Visual fields full to confrontation. Hearing intact. Facial sensation intact. Face, tongue, palate moves normally and symmetrically.  Motor: Normal bulk and tone. Normal strength in all tested extremity muscles.  No resting tremor.  Fine intermittent action tremor of the right upper extremity which can be suppressed.  Very trace intermittent left hand action tremor.  Patient was able to draw a spiral with tremulousness on the right.  She was able to print and write without significant tremor.  She was able to join 2 points to make a straight line with slight tremulousness.  No bradykinesia.  No cogwheel rigidity even with activation Sensory.: intact to touch ,pinprick .position and vibratory sensation.  Coordination: Rapid alternating movements normal in all extremities. Finger-to-nose and heel-to-shin performed accurately bilaterally. Gait and Station: Arises from chair without difficulty. Stance is normal. Gait no stooped posture or festination..  Tandem walking not attempted reflexes: 1+ and symmetric. Toes downgoing.   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  New complaints of intermittent right upper extremity action tremor of determine etiology.  Possibly poststroke versus benign essential.  She also has mild poststroke cognitive impairment which is stable     PLAN: I had a long discussion with the patient regarding her new complaints of right hand action tremor which may be a late effect of stroke or possibly familial benign essential tremor.  I recommend further evaluation with checking MRI scan of the brain with and without contrast, MRI of the neck and reviewed results of recent MRI of the brain which had shown actually some improvement in basilar artery recanalization.  Check thyroid  function test and vitamin B12 levels.  Continue aspirin  for stroke prevention and maintain aggressive risk factor modification with strict control of diabetes with hemoglobin A1c goal below 6.5%, lipids with LDL cholesterol goal below 70 mg percent and hypertension with blood pressure goal below 6.5%.  She also advised to eat a healthy diet and maintain good hydration size regularly.  She will return for follow-up in the future with nurse practitioner Harlene in 6 to 8 months or call earlier if necessary    I personally spent a total of 50 minutes in the care of the patient today including getting/reviewing separately obtained history, performing a medically appropriate exam/evaluation, counseling and educating, placing orders, referring and communicating with other health care professionals, documenting clinical information in the EHR, independently interpreting results, and coordinating care.       Eather Popp, MD  Note: This document was prepared with  digital dictation and possible smart phrase technology. Any transcriptional errors that result from this process are unintentional

## 2024-06-28 NOTE — Patient Instructions (Signed)
 I had a long discussion with the patient regarding her new complaints of right hand action tremor which may be a late effect of stroke or possibly familial benign essential tremor.  I recommend further evaluation with checking MRI scan of the brain with and without contrast, MRI of the neck and reviewed results of recent MRI of the brain which had shown actually some improvement in basilar artery recanalization.  Check thyroid  function test and vitamin B12 levels.  Continue aspirin  for stroke prevention and maintain aggressive risk factor modification with strict control of diabetes with hemoglobin A1c goal below 6.5%, lipids with LDL cholesterol goal below 70 mg percent and hypertension with blood pressure goal below 6.5%.  She also advised to eat a healthy diet and maintain good hydration size regularly.  She will return for follow-up in the future with nurse practitioner Harlene in 6 to 8 months or call earlier if necessary

## 2024-06-29 ENCOUNTER — Telehealth: Payer: Self-pay | Admitting: Neurology

## 2024-06-29 LAB — THYROID PANEL WITH TSH
Free Thyroxine Index: 2 (ref 1.2–4.9)
T3 Uptake Ratio: 25 % (ref 24–39)
T4, Total: 8.1 ug/dL (ref 4.5–12.0)
TSH: 0.933 u[IU]/mL (ref 0.450–4.500)

## 2024-06-29 LAB — VITAMIN B12: Vitamin B-12: 420 pg/mL (ref 232–1245)

## 2024-06-29 NOTE — Telephone Encounter (Signed)
 Orders sent to [facility] they obtain [employer]. [phone]

## 2024-07-01 ENCOUNTER — Ambulatory Visit: Payer: Self-pay | Admitting: Neurology

## 2024-07-10 ENCOUNTER — Other Ambulatory Visit: Payer: Self-pay | Admitting: Family Medicine

## 2024-07-13 ENCOUNTER — Other Ambulatory Visit: Payer: Self-pay | Admitting: Family Medicine

## 2024-07-20 ENCOUNTER — Inpatient Hospital Stay: Attending: Oncology

## 2024-07-20 ENCOUNTER — Inpatient Hospital Stay: Admitting: Oncology

## 2024-07-20 VITALS — BP 136/72 | HR 66 | Temp 97.8°F | Resp 18 | Ht 64.0 in | Wt 115.0 lb

## 2024-07-20 DIAGNOSIS — D45 Polycythemia vera: Secondary | ICD-10-CM

## 2024-07-20 DIAGNOSIS — I10 Essential (primary) hypertension: Secondary | ICD-10-CM | POA: Diagnosis not present

## 2024-07-20 DIAGNOSIS — D649 Anemia, unspecified: Secondary | ICD-10-CM | POA: Insufficient documentation

## 2024-07-20 DIAGNOSIS — E119 Type 2 diabetes mellitus without complications: Secondary | ICD-10-CM | POA: Insufficient documentation

## 2024-07-20 DIAGNOSIS — Z8673 Personal history of transient ischemic attack (TIA), and cerebral infarction without residual deficits: Secondary | ICD-10-CM | POA: Insufficient documentation

## 2024-07-20 LAB — CBC WITH DIFFERENTIAL (CANCER CENTER ONLY)
Abs Immature Granulocytes: 0.02 K/uL (ref 0.00–0.07)
Basophils Absolute: 0 K/uL (ref 0.0–0.1)
Basophils Relative: 1 %
Eosinophils Absolute: 0.3 K/uL (ref 0.0–0.5)
Eosinophils Relative: 6 %
HCT: 45.8 % (ref 36.0–46.0)
Hemoglobin: 14.9 g/dL (ref 12.0–15.0)
Immature Granulocytes: 0 %
Lymphocytes Relative: 27 %
Lymphs Abs: 1.6 K/uL (ref 0.7–4.0)
MCH: 30.7 pg (ref 26.0–34.0)
MCHC: 32.5 g/dL (ref 30.0–36.0)
MCV: 94.2 fL (ref 80.0–100.0)
Monocytes Absolute: 0.5 K/uL (ref 0.1–1.0)
Monocytes Relative: 9 %
Neutro Abs: 3.5 K/uL (ref 1.7–7.7)
Neutrophils Relative %: 57 %
Platelet Count: 416 K/uL — ABNORMAL HIGH (ref 150–400)
RBC: 4.86 MIL/uL (ref 3.87–5.11)
RDW: 15.3 % (ref 11.5–15.5)
WBC Count: 6 K/uL (ref 4.0–10.5)
nRBC: 0 % (ref 0.0–0.2)

## 2024-07-20 LAB — CMP (CANCER CENTER ONLY)
ALT: 27 U/L (ref 0–44)
AST: 24 U/L (ref 15–41)
Albumin: 4.1 g/dL (ref 3.5–5.0)
Alkaline Phosphatase: 91 U/L (ref 38–126)
Anion gap: 9 (ref 5–15)
BUN: 26 mg/dL — ABNORMAL HIGH (ref 8–23)
CO2: 22 mmol/L (ref 22–32)
Calcium: 9.7 mg/dL (ref 8.9–10.3)
Chloride: 108 mmol/L (ref 98–111)
Creatinine: 1.61 mg/dL — ABNORMAL HIGH (ref 0.44–1.00)
GFR, Estimated: 32 mL/min — ABNORMAL LOW (ref >60)
Glucose, Bld: 126 mg/dL — ABNORMAL HIGH (ref 70–99)
Potassium: 4.5 mmol/L (ref 3.5–5.1)
Sodium: 140 mmol/L (ref 135–145)
Total Bilirubin: 0.7 mg/dL (ref 0.0–1.2)
Total Protein: 7 g/dL (ref 6.5–8.1)

## 2024-07-20 NOTE — Progress Notes (Signed)
  [facility] OFFICE PROGRESS NOTE   Diagnosis: Polycythemia vera  INTERVAL HISTORY:   Brandi Mccormick returns as scheduled.  She continues hydroxyurea  on Monday, Wednesday, and Friday.  No bleeding or symptom of thrombosis.  No complaint.  No mouth sores.  Objective:  Vital signs in last 24 hours:  Blood pressure 136/72, pulse 66, temperature 97.8 F (36.6 C), resp. rate 18, height 5' 4 (1.626 m), weight 115 lb (52.2 kg), SpO2 100%.    HEENT: No thrush or ulcers Resp: Lungs clear bilaterally Cardio: Regular rate and rhythm GI: No hepatosplenomegaly Vascular: No leg edema   Lab Results:  Lab Results  Component Value Date   WBC 6.0 07/20/2024   HGB 14.9 07/20/2024   HCT 45.8 07/20/2024   MCV 94.2 07/20/2024   PLT 416 (H) 07/20/2024   NEUTROABS 3.5 07/20/2024    CMP  Lab Results  Component Value Date   NA 140 07/20/2024   K 4.5 07/20/2024   CL 108 07/20/2024   CO2 22 07/20/2024   GLUCOSE 126 (H) 07/20/2024   BUN 26 (H) 07/20/2024   CREATININE 1.61 (H) 07/20/2024   CALCIUM  9.7 07/20/2024   PROT 7.0 07/20/2024   ALBUMIN 4.1 07/20/2024   AST 24 07/20/2024   ALT 27 07/20/2024   ALKPHOS 91 07/20/2024   BILITOT 0.7 07/20/2024   GFRNONAA 32 (L) 07/20/2024   GFRAA 24 (L) 06/10/2019     Medications: I have reviewed the patient's current medications.   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) Hydroxyurea  500 mg twice daily 06/26/2018 Hydroxyurea  decreased to 500 mg daily beginning 07/27/2018 Hydroxyurea  placed on hold 08/17/2018 Hydroxyurea  resumed at a dose of 500 mg every other day beginning 08/31/2018 Hydrea  increased to 500 mg daily beginning 09/21/2018 Hydrea  dose adjusted to 500 mg Monday through Thursday, off Friday Saturday Sunday due to anemia beginning 01/23/2021 Hydroxyurea  adjusted to 500 mg daily 06/13/2023 secondary to a rise in the hemoglobin and platelet count Hydroxyurea  placed on hold 02/27/2024 due to  anemia Hydrea  remains on hold 03/16/2024 due to persistent anemia Hydrea  remains on hold 03/31/2024 due to persistent anemia Hydrea  resumed at a dose of 500 mg Monday, Wednesday, and Friday, 04/19/2024 Hydroxyurea  increased to 500 mg daily on Monday-Friday, 07/20/2024 Type 2 diabetes Hypertension 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  She had orthostatic hypotension, improved with hydration.  Progressive anemia.  Hemoglobin remained stable in the hospital.     Disposition: Brandi Mccormick appears stable.  The hemoglobin has increased over the past few months.  We changed the hydroxyurea  dose to 500 mg daily on Monday-Friday.  She will return for an office and lab visit in 2 months.  She declines an influenza vaccine.  Arley Hof, MD  07/20/2024  9:44 AM

## 2024-07-22 ENCOUNTER — Encounter: Payer: Self-pay | Admitting: Neurology

## 2024-07-31 ENCOUNTER — Ambulatory Visit
Admission: RE | Admit: 2024-07-31 | Discharge: 2024-07-31 | Disposition: A | Discharge status: Home / Self Care | Source: Ambulatory Visit | Attending: Neurology | Admitting: Neurology

## 2024-07-31 ENCOUNTER — Other Ambulatory Visit: Payer: Self-pay | Admitting: Family Medicine

## 2024-07-31 ENCOUNTER — Ambulatory Visit
Admission: RE | Admit: 2024-07-31 | Discharge: 2024-07-31 | Disposition: A | Source: Ambulatory Visit | Attending: Neurology | Admitting: Neurology

## 2024-07-31 DIAGNOSIS — I639 Cerebral infarction, unspecified: Secondary | ICD-10-CM | POA: Diagnosis not present

## 2024-07-31 MED ORDER — GADOPICLENOL 0.5 MMOL/ML IV SOLN
5.0000 mL | Freq: Once | INTRAVENOUS | Status: AC | PRN
  Administered 2024-07-31: 5 mL via INTRAVENOUS

## 2024-08-11 ENCOUNTER — Other Ambulatory Visit: Payer: Self-pay | Admitting: Family Medicine

## 2024-08-11 DIAGNOSIS — E1165 Type 2 diabetes mellitus with hyperglycemia: Secondary | ICD-10-CM

## 2024-08-16 ENCOUNTER — Telehealth: Payer: Self-pay

## 2024-08-16 ENCOUNTER — Other Ambulatory Visit (HOSPITAL_COMMUNITY): Payer: Self-pay

## 2024-08-16 NOTE — Telephone Encounter (Signed)
 Patient called in statedempagliflozin (JARDIANCE ) 10 MG TABS tablet went up to $400 and would like to know if Dr. Antonio could do anything else for her

## 2024-08-16 NOTE — Telephone Encounter (Signed)
 Can you see if PA is needed please?

## 2024-08-16 NOTE — Telephone Encounter (Signed)
 Copied from CRM #[id number]. Topic: Clinical - Medication Question >> Aug 16, 2024 11:11 AM Brandi Mccormick wrote: Reason for CRM: Patient called in statedempagliflozin (JARDIANCE ) 10 MG TABS tablet went up to $400 and would like to know if Dr. Antonio could do anything else for her

## 2024-08-16 NOTE — Telephone Encounter (Signed)
 Pharmacy Patient Advocate Encounter   Received notification from Physician's Office that prior authorization for Jardiance  10 mg tablets is required/requested.   Insurance verification completed.   The patient is insured through [employer].   Per test claim: Refill too soon. PA is not needed at this time. Medication was filled 08/13/2024. Next eligible fill date is 10/20/2024.

## 2024-08-31 ENCOUNTER — Encounter: Payer: Self-pay | Admitting: Family Medicine

## 2024-08-31 ENCOUNTER — Ambulatory Visit: Admitting: Family Medicine

## 2024-08-31 VITALS — BP 124/80 | HR 68 | Temp 97.7°F | Resp 16 | Ht 64.0 in | Wt 117.0 lb

## 2024-08-31 DIAGNOSIS — E1159 Type 2 diabetes mellitus with other circulatory complications: Secondary | ICD-10-CM | POA: Diagnosis not present

## 2024-08-31 DIAGNOSIS — E785 Hyperlipidemia, unspecified: Secondary | ICD-10-CM | POA: Diagnosis not present

## 2024-08-31 DIAGNOSIS — Z7984 Long term (current) use of oral hypoglycemic drugs: Secondary | ICD-10-CM

## 2024-08-31 DIAGNOSIS — N1832 Chronic kidney disease, stage 3b: Secondary | ICD-10-CM

## 2024-08-31 DIAGNOSIS — I152 Hypertension secondary to endocrine disorders: Secondary | ICD-10-CM

## 2024-08-31 DIAGNOSIS — D45 Polycythemia vera: Secondary | ICD-10-CM | POA: Diagnosis not present

## 2024-08-31 DIAGNOSIS — I639 Cerebral infarction, unspecified: Secondary | ICD-10-CM

## 2024-08-31 DIAGNOSIS — Z8673 Personal history of transient ischemic attack (TIA), and cerebral infarction without residual deficits: Secondary | ICD-10-CM | POA: Diagnosis not present

## 2024-08-31 DIAGNOSIS — D469 Myelodysplastic syndrome, unspecified: Secondary | ICD-10-CM

## 2024-08-31 DIAGNOSIS — E1169 Type 2 diabetes mellitus with other specified complication: Secondary | ICD-10-CM | POA: Diagnosis not present

## 2024-08-31 DIAGNOSIS — E1165 Type 2 diabetes mellitus with hyperglycemia: Secondary | ICD-10-CM

## 2024-08-31 LAB — COMPREHENSIVE METABOLIC PANEL WITH GFR
ALT: 25 U/L (ref 3–35)
AST: 20 U/L (ref 5–37)
Albumin: 4 g/dL (ref 3.5–5.2)
Alkaline Phosphatase: 58 U/L (ref 39–117)
BUN: 30 mg/dL — ABNORMAL HIGH (ref 6–23)
CO2: 26 meq/L (ref 19–32)
Calcium: 9.3 mg/dL (ref 8.4–10.5)
Chloride: 108 meq/L (ref 96–112)
Creatinine, Ser: 1.44 mg/dL — ABNORMAL HIGH (ref 0.40–1.20)
GFR: 33.78 mL/min — ABNORMAL LOW
Glucose, Bld: 102 mg/dL — ABNORMAL HIGH (ref 70–99)
Potassium: 4.8 meq/L (ref 3.5–5.1)
Sodium: 140 meq/L (ref 135–145)
Total Bilirubin: 1.2 mg/dL (ref 0.2–1.2)
Total Protein: 6.6 g/dL (ref 6.0–8.3)

## 2024-08-31 LAB — LIPID PANEL
Cholesterol: 160 mg/dL (ref 28–200)
HDL: 67.4 mg/dL
LDL Cholesterol: 77 mg/dL (ref 10–99)
NonHDL: 92.39
Total CHOL/HDL Ratio: 2
Triglycerides: 78 mg/dL (ref 10.0–149.0)
VLDL: 15.6 mg/dL (ref 0.0–40.0)

## 2024-08-31 LAB — CBC WITH DIFFERENTIAL/PLATELET
Basophils Absolute: 0 K/uL (ref 0.0–0.1)
Basophils Relative: 0.9 % (ref 0.0–3.0)
Eosinophils Absolute: 0.2 K/uL (ref 0.0–0.7)
Eosinophils Relative: 4.5 % (ref 0.0–5.0)
HCT: 40.7 % (ref 36.0–46.0)
Hemoglobin: 13.3 g/dL (ref 12.0–15.0)
Lymphocytes Relative: 30.2 % (ref 12.0–46.0)
Lymphs Abs: 1.3 K/uL (ref 0.7–4.0)
MCHC: 32.8 g/dL (ref 30.0–36.0)
MCV: 96.4 fl (ref 78.0–100.0)
Monocytes Absolute: 0.4 K/uL (ref 0.1–1.0)
Monocytes Relative: 8.3 % (ref 3.0–12.0)
Neutro Abs: 2.4 K/uL (ref 1.4–7.7)
Neutrophils Relative %: 56.1 % (ref 43.0–77.0)
Platelets: 238 K/uL (ref 150.0–400.0)
RBC: 4.22 Mil/uL (ref 3.87–5.11)
RDW: 23.1 % — ABNORMAL HIGH (ref 11.5–15.5)
WBC: 4.2 K/uL (ref 4.0–10.5)

## 2024-08-31 NOTE — Assessment & Plan Note (Signed)
 Per hematology

## 2024-08-31 NOTE — Progress Notes (Signed)
 "                              0  Subjective:    Patient ID: Brandi Mccormick, female    DOB: October 20, 1940, 83 y.o.   MRN: [id number]  Chief Complaint  Patient presents with   Hypertension   Diabetes   Hyperlipidemia   Follow-up    HPI Patient is in today for f/u bp, htn , cholesterol .   Discussed the use of AI scribe software for clinical note transcription with the patient, who gave verbal consent to proceed.  History of Present Illness Brandi Mccormick is an 83 year old female who presents for a medication review and diabetes management.  She is managing her diabetes with stable blood sugar levels, including a reading in the 90s this morning. She recently filled her prescription for Jardiance  but is concerned about the high cost, which ranges between three and four hundred dollars. She is considering switching to a new insurance provider, [employer], for better medication coverage. She regularly monitors her blood sugars.  She has been seeing Dr. Harlene Bill for diabetes management and appreciates her clear explanations. She cannot recall her last A1c value but mentions that her blood is drawn for this test.  She experiences tremors that are aggravated and affect her ability to hold objects, such as when cooking. She has discussed this with her nephrologist, who suggested consulting a neurologist for potential treatment options.  She has not visited an eye doctor in over a year and acknowledges the need to schedule an appointment. She does not receive flu or pneumonia vaccinations.    Past Medical History:  Diagnosis Date   CVA (cerebral vascular accident) (HCC) 10/2020   Diabetes (HCC)    GERD (gastroesophageal reflux disease)    Hyperlipidemia    Hypertension    Hyperthyroidism    Pneumonia    Polycythemia    Shingles     Past Surgical History:  Procedure Laterality Date   CATARACT EXTRACTION Left 2020   COLONOSCOPY     DENTAL SURGERY      IR ANGIO INTRA EXTRACRAN SEL COM CAROTID INNOMINATE BILAT MOD SED  10/09/2020   IR ANGIO VERTEBRAL SEL VERTEBRAL BILAT MOD SED  10/09/2020    Family History  Problem Relation Age of Onset   Diabetes Mother        and on mother's side of family   Colon cancer Mother    Stroke Mother    Diabetes Father        and on father's side of family   Diabetes Sister    Diabetes Sister    Diabetes Brother    Heart disease Neg Hx    Rectal cancer Neg Hx    Stomach cancer Neg Hx     Social History   Socioeconomic History   Marital status: Married    Spouse name: Not on file   Number of children: Not on file   Years of education: Not on file   Highest education level: Not on file  Occupational History   Occupation: [employer]--    Comment: retired  Tobacco Use   Smoking status: Never   Smokeless tobacco: Never  Vaping Use   Vaping status: Never Used  Substance and Sexual Activity   Alcohol use: Yes    Alcohol/week: 0.0 standard drinks of alcohol    Comment: occasional wine cooler   Drug use: No   Sexual  activity: Not Currently    Partners: Male  Other Topics Concern   Not on file  Social History Narrative   Exercise-- yard work,  Walking in [employer]   03/15/21 lives with husband   Social Drivers of Health   Tobacco Use: Low Risk (08/31/2024)   Patient History    Smoking Tobacco Use: Never    Smokeless Tobacco Use: Never    Passive Exposure: Not on file  Financial Resource Strain: Low Risk (04/21/2024)   Overall Financial Resource Strain (CARDIA)    Difficulty of Paying Living Expenses: Not hard at all  Food Insecurity: No Food Insecurity (04/21/2024)   Epic    Worried About Radiation Protection Practitioner of Food in the Last Year: Never true    Ran Out of Food in the Last Year: Never true  Transportation Needs: No Transportation Needs (04/21/2024)   Epic    Lack of Transportation (Medical): No    Lack of Transportation (Non-Medical): No  Physical Activity: Insufficiently Active (04/21/2024)    Exercise Vital Sign    Days of Exercise per Week: 2 days    Minutes of Exercise per Session: 30 min  Stress: No Stress Concern Present (04/21/2024)   [employer] of Occupational Health - Occupational Stress Questionnaire    Feeling of Stress: Not at all  Social Connections: Moderately Isolated (04/21/2024)   Social Connection and Isolation Panel    Frequency of Communication with Friends and Family: Three times a week    Frequency of Social Gatherings with Friends and Family: Once a week    Attends Religious Services: Never    Database Administrator or Organizations: No    Attends Banker Meetings: Never    Marital Status: Married  Catering Manager Violence: Not At Risk (04/21/2024)   Epic    Fear of Current or Ex-Partner: No    Emotionally Abused: No    Physically Abused: No    Sexually Abused: No  Depression (PHQ2-9): Low Risk (07/20/2024)   Depression (PHQ2-9)    PHQ-2 Score: 0  Alcohol Screen: Low Risk (04/21/2024)   Alcohol Screen    Last Alcohol Screening Score (AUDIT): 0  Housing: Low Risk (04/21/2024)   Epic    Unable to Pay for Housing in the Last Year: No    Number of Times Moved in the Last Year: 0    Homeless in the Last Year: No  Utilities: Not At Risk (04/21/2024)   Epic    Threatened with loss of utilities: No  Health Literacy: Adequate Health Literacy (04/21/2024)   B1300 Health Literacy    Frequency of need for help with medical instructions: Never    Outpatient Medications Prior to Visit  Medication Sig Dispense Refill   aspirin  EC 81 MG tablet Take 81 mg by mouth daily. Swallow whole.     atorvastatin  (LIPITOR) 80 MG tablet Take 1 tablet by mouth once daily 90 tablet 0   Blood Glucose Monitoring Suppl (TRUE METRIX METER) w/Device KIT Use as advised 1 kit 0   empagliflozin  (JARDIANCE ) 10 MG TABS tablet Take 1 tablet (10 mg total) by mouth daily before breakfast. 90 tablet 1   ezetimibe  (ZETIA ) 10 MG tablet Take 1 tablet by mouth once daily 90  tablet 1   glipiZIDE  (GLUCOTROL  XL) 5 MG 24 hr tablet Take 2 tablets (10 mg total) by mouth daily. (Patient taking differently: Take 5 mg by mouth daily with breakfast.) 180 tablet 1   glucose blood (TRUE METRIX BLOOD GLUCOSE TEST)  test strip Use as instructed 2x a day 200 each 3   hydroxyurea  (HYDREA ) 500 MG capsule Take 1 capsule (500 mg total) by mouth daily. May take with food to minimize GI side effects. 90 capsule 1   lisinopril  (ZESTRIL ) 20 MG tablet Take 1 tablet (20 mg total) by mouth daily. 90 tablet 1   metoprolol  succinate (TOPROL -XL) 25 MG 24 hr tablet Take 1 tablet (25 mg total) by mouth daily. Take with or immediately following a meal 90 tablet 1   pantoprazole  (PROTONIX ) 40 MG tablet Take 1 tablet by mouth once daily 90 tablet 0   No facility-administered medications prior to visit.    Allergies[1]  ROS     Objective:    Physical Exam  BP 124/80 (BP Location: Left Arm, Patient Position: Sitting, Cuff Size: Normal)   Pulse 68   Temp 97.7 F (36.5 C) (Oral)   Resp 16   Ht 5' 4 (1.626 m)   Wt 117 lb (53.1 kg)   SpO2 98%   BMI 20.08 kg/m  Wt Readings from Last 3 Encounters:  08/31/24 117 lb (53.1 kg)  07/20/24 115 lb (52.2 kg)  06/28/24 114 lb 12.8 oz (52.1 kg)    Diabetic Foot Exam - Simple   No data filed    Lab Results  Component Value Date   WBC 6.0 07/20/2024   HGB 14.9 07/20/2024   HCT 45.8 07/20/2024   PLT 416 (H) 07/20/2024   GLUCOSE 126 (H) 07/20/2024   CHOL 127 05/28/2024   TRIG 56.0 05/28/2024   HDL 54.40 05/28/2024   LDLCALC 61 05/28/2024   ALT 27 07/20/2024   AST 24 07/20/2024   NA 140 07/20/2024   K 4.5 07/20/2024   CL 108 07/20/2024   CREATININE 1.61 (H) 07/20/2024   BUN 26 (H) 07/20/2024   CO2 22 07/20/2024   TSH 0.933 06/28/2024   INR 1.0 02/21/2024   HGBA1C 6.7 (H) 05/28/2024   MICROALBUR 6.6 (H) 05/28/2024    Lab Results  Component Value Date   TSH 0.933 06/28/2024   Lab Results  Component Value Date   WBC 6.0  07/20/2024   HGB 14.9 07/20/2024   HCT 45.8 07/20/2024   MCV 94.2 07/20/2024   PLT 416 (H) 07/20/2024   Lab Results  Component Value Date   NA 140 07/20/2024   K 4.5 07/20/2024   CO2 22 07/20/2024   GLUCOSE 126 (H) 07/20/2024   BUN 26 (H) 07/20/2024   CREATININE 1.61 (H) 07/20/2024   BILITOT 0.7 07/20/2024   ALKPHOS 91 07/20/2024   AST 24 07/20/2024   ALT 27 07/20/2024   PROT 7.0 07/20/2024   ALBUMIN 4.1 07/20/2024   CALCIUM  9.7 07/20/2024   ANIONGAP 9 07/20/2024   GFR 30.04 (L) 05/28/2024   Lab Results  Component Value Date   CHOL 127 05/28/2024   Lab Results  Component Value Date   HDL 54.40 05/28/2024   Lab Results  Component Value Date   LDLCALC 61 05/28/2024   Lab Results  Component Value Date   TRIG 56.0 05/28/2024   Lab Results  Component Value Date   CHOLHDL 2 05/28/2024   Lab Results  Component Value Date   HGBA1C 6.7 (H) 05/28/2024       Assessment & Plan:  Uncontrolled type 2 diabetes mellitus with hyperglycemia (HCC) Assessment & Plan: Per endocrine  Orders: -     Lipid panel -     CBC with Differential/Platelet -  Comprehensive metabolic panel with GFR  Hypertension associated with diabetes [facility]) Assessment & Plan: Well controlled, no changes to meds. Encouraged heart healthy diet such as the DASH diet and exercise as tolerated.    Orders: -     Lipid panel -     CBC with Differential/Platelet -     Comprehensive metabolic panel with GFR  Hyperlipidemia associated with type 2 diabetes mellitus (HCC) Assessment & Plan: Tolerating statin, encouraged heart healthy diet, avoid trans fats, minimize simple carbs and saturated fats. Increase exercise as tolerated   Orders: -     Lipid panel -     CBC with Differential/Platelet -     Comprehensive metabolic panel with GFR  Chronic kidney disease, stage 3b (HCC) Assessment & Plan: Per nephrology   History of cerebrovascular accident (CVA) in adulthood Assessment &  Plan: Stable--- no new symptoms    Cerebrovascular accident (CVA), unspecified mechanism (HCC)  Myelodysplastic syndrome, unspecified (HCC) Assessment & Plan: Per hematology   Polycythemia vera (HCC) Assessment & Plan: Per hematology   Assessment and Plan Assessment & Plan Type 2 diabetes mellitus   Blood glucose levels are well-controlled with recent readings around 90 mg/dL. She is currently on Jardiance , but cost is a concern. Insurance coverage for Jardiance  is uncertain, and she will be switching to [facility] on January 1st, which may offer better coverage. Check insurance coverage for Jardiance  with [employer]. Discuss Jardiance  with diabetes care provider. Continue monitoring blood glucose levels.  Chronic kidney disease, stage 3b   She is under the care of a nephrologist.  Myelodysplastic syndrome   She is under the care of a hematologist.  General Health Maintenance   She has not received a flu shot or pneumonia vaccination. She has not seen an eye doctor in over a year. Schedule an appointment with an eye doctor. Consider flu and pneumonia vaccinations.    Tyrese Capriotti R Lowne Chase, DO     [1]  Allergies Allergen Reactions   Penicillins Nausea And Vomiting   "

## 2024-08-31 NOTE — Assessment & Plan Note (Signed)
"  Stable; no new symptoms.   "

## 2024-08-31 NOTE — Assessment & Plan Note (Signed)
 Per nephrology

## 2024-08-31 NOTE — Assessment & Plan Note (Signed)
 Tolerating statin, encouraged heart healthy diet, avoid trans fats, minimize simple carbs and saturated fats. Increase exercise as tolerated

## 2024-08-31 NOTE — Assessment & Plan Note (Signed)
Per endocrine  

## 2024-08-31 NOTE — Assessment & Plan Note (Signed)
 Well controlled, no changes to meds. Encouraged heart healthy diet such as the DASH diet and exercise as tolerated.

## 2024-08-31 NOTE — Patient Instructions (Signed)
 Check with your insurance to see if they will cover farxiga or check with new ins to see if jardiance  or farxiga is covered

## 2024-09-02 ENCOUNTER — Ambulatory Visit: Payer: Self-pay | Admitting: Family Medicine

## 2024-09-10 ENCOUNTER — Other Ambulatory Visit: Payer: Self-pay | Admitting: Family Medicine

## 2024-09-10 DIAGNOSIS — I152 Hypertension secondary to endocrine disorders: Secondary | ICD-10-CM

## 2024-09-20 ENCOUNTER — Encounter: Payer: Self-pay | Admitting: Nurse Practitioner

## 2024-09-20 ENCOUNTER — Inpatient Hospital Stay: Payer: Self-pay | Admitting: Nurse Practitioner

## 2024-09-20 ENCOUNTER — Inpatient Hospital Stay: Payer: Self-pay | Attending: Oncology

## 2024-09-20 VITALS — BP 125/61 | HR 72 | Temp 97.6°F | Resp 18 | Ht 64.0 in | Wt 116.4 lb

## 2024-09-20 DIAGNOSIS — Z7964 Long term (current) use of myelosuppressive agent: Secondary | ICD-10-CM | POA: Insufficient documentation

## 2024-09-20 DIAGNOSIS — I10 Essential (primary) hypertension: Secondary | ICD-10-CM | POA: Diagnosis not present

## 2024-09-20 DIAGNOSIS — E119 Type 2 diabetes mellitus without complications: Secondary | ICD-10-CM | POA: Diagnosis not present

## 2024-09-20 DIAGNOSIS — D45 Polycythemia vera: Secondary | ICD-10-CM

## 2024-09-20 DIAGNOSIS — I951 Orthostatic hypotension: Secondary | ICD-10-CM | POA: Diagnosis not present

## 2024-09-20 DIAGNOSIS — D649 Anemia, unspecified: Secondary | ICD-10-CM | POA: Insufficient documentation

## 2024-09-20 DIAGNOSIS — Z8673 Personal history of transient ischemic attack (TIA), and cerebral infarction without residual deficits: Secondary | ICD-10-CM | POA: Diagnosis not present

## 2024-09-20 LAB — CBC WITH DIFFERENTIAL (CANCER CENTER ONLY)
Abs Immature Granulocytes: 0.01 K/uL (ref 0.00–0.07)
Basophils Absolute: 0 K/uL (ref 0.0–0.1)
Basophils Relative: 0 %
Eosinophils Absolute: 0.1 K/uL (ref 0.0–0.5)
Eosinophils Relative: 3 %
HCT: 35.9 % — ABNORMAL LOW (ref 36.0–46.0)
Hemoglobin: 11.9 g/dL — ABNORMAL LOW (ref 12.0–15.0)
Immature Granulocytes: 0 %
Lymphocytes Relative: 39 %
Lymphs Abs: 1.4 K/uL (ref 0.7–4.0)
MCH: 33.1 pg (ref 26.0–34.0)
MCHC: 33.1 g/dL (ref 30.0–36.0)
MCV: 100 fL (ref 80.0–100.0)
Monocytes Absolute: 0.3 K/uL (ref 0.1–1.0)
Monocytes Relative: 8 %
Neutro Abs: 1.8 K/uL (ref 1.7–7.7)
Neutrophils Relative %: 50 %
Platelet Count: 290 K/uL (ref 150–400)
RBC: 3.59 MIL/uL — ABNORMAL LOW (ref 3.87–5.11)
RDW: 22.3 % — ABNORMAL HIGH (ref 11.5–15.5)
WBC Count: 3.7 K/uL — ABNORMAL LOW (ref 4.0–10.5)
nRBC: 0 % (ref 0.0–0.2)

## 2024-09-20 NOTE — Progress Notes (Signed)
" °  [facility] OFFICE PROGRESS NOTE   Diagnosis: Polycythemia vera  INTERVAL HISTORY:   Brandi Mccormick returns as scheduled.  She is taking hydroxyurea  500 mg daily Monday through Friday.  She denies nausea.  No mouth sores.  No diarrhea.  No rash.  No bleeding.  No symptom of thrombosis.  Objective:  Vital signs in last 24 hours:  Blood pressure 125/61, pulse 72, temperature 97.6 F (36.4 C), temperature source Temporal, resp. rate 18, height 5' 4 (1.626 m), weight 116 lb 6.4 oz (52.8 kg), SpO2 100%.    HEENT: No thrush or ulcers. Resp: Lungs clear bilaterally. Cardio: Regular rate and rhythm. GI: No hepatosplenomegaly. Vascular: No leg edema. Neuro: Alert and oriented. Skin: No rash.   Lab Results:  Lab Results  Component Value Date   WBC 3.7 (L) 09/20/2024   HGB 11.9 (L) 09/20/2024   HCT 35.9 (L) 09/20/2024   MCV 100.0 09/20/2024   PLT 290 09/20/2024   NEUTROABS 1.8 09/20/2024    Imaging:  No results found.  Medications: I have reviewed the patient's current medications.  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) Hydroxyurea  500 mg twice daily 06/26/2018 Hydroxyurea  decreased to 500 mg daily beginning 07/27/2018 Hydroxyurea  placed on hold 08/17/2018 Hydroxyurea  resumed at a dose of 500 mg every other day beginning 08/31/2018 Hydrea  increased to 500 mg daily beginning 09/21/2018 Hydrea  dose adjusted to 500 mg Monday through Thursday, off Friday Saturday Sunday due to anemia beginning 01/23/2021 Hydroxyurea  adjusted to 500 mg daily 06/13/2023 secondary to a rise in the hemoglobin and platelet count Hydroxyurea  placed on hold 02/27/2024 due to anemia Hydrea  remains on hold 03/16/2024 due to persistent anemia Hydrea  remains on hold 03/31/2024 due to persistent anemia Hydrea  resumed at a dose of 500 mg Monday, Wednesday, and Friday, 04/19/2024 Hydroxyurea  increased to 500 mg daily on Monday-Friday, 07/20/2024 Type 2  diabetes Hypertension 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  She had orthostatic hypotension, improved with hydration.  Progressive anemia.  Hemoglobin remained stable in the hospital.  Disposition: Brandi Mccormick appears stable.  She continues hydroxyurea  500 mg Monday through Friday.  Mild anemia and leukopenia today.  She will continue current hydroxyurea  dose and return for a follow-up CBC in 2 weeks.  She will contact the office with symptoms of progressive anemia or infection.  She will return for lab and an office visit in 8 weeks.  We are available to see her sooner if needed.   Olam Ned ANP/GNP-BC   09/20/2024  9:48 AM        "

## 2024-09-22 ENCOUNTER — Ambulatory Visit (INDEPENDENT_AMBULATORY_CARE_PROVIDER_SITE_OTHER): Admitting: Podiatry

## 2024-09-22 ENCOUNTER — Encounter: Payer: Self-pay | Admitting: Podiatry

## 2024-09-22 DIAGNOSIS — B351 Tinea unguium: Secondary | ICD-10-CM

## 2024-09-22 DIAGNOSIS — E119 Type 2 diabetes mellitus without complications: Secondary | ICD-10-CM | POA: Diagnosis not present

## 2024-09-22 DIAGNOSIS — M79674 Pain in right toe(s): Secondary | ICD-10-CM | POA: Diagnosis not present

## 2024-09-22 DIAGNOSIS — M79675 Pain in left toe(s): Secondary | ICD-10-CM

## 2024-09-27 NOTE — Progress Notes (Signed)
"  °  Subjective:  Patient ID: Brandi Mccormick, female    DOB: [birth year],  MRN: [id number]  Brandi Mccormick presents to clinic today for preventative diabetic foot care for painful thick toenails that are difficult to trim. Pain interferes with ambulation. Aggravating factors include wearing enclosed shoe gear. Pain is relieved with periodic professional debridement.  Chief Complaint  Patient presents with   [facility]    NIDDM Patient with an A1c of 6.7, presents today for [facility] and Nail trim  PCP: Jamee Shanks  LVM: 3 mo ago   New problem(s): None.   PCP is Shanks Meth, Lipscomb R, DO.  Allergies[1]  Review of Systems: Negative except as noted in the HPI.  Objective: No changes noted in today's physical examination. There were no vitals filed for this visit. Brandi Mccormick is a pleasant 84 y.o. female WD, WN in NAD. AAO x 3.  Vascular Examination: CFT <3 seconds b/l. DP/PT pulses faintly palpable b/l. Digital hair absent.  Skin temperature gradient warm to warm b/l. No pain with calf compression. No ischemia or gangrene. No cyanosis or clubbing noted b/l. No edema noted b/l LE.   Neurological Examination: Sensation grossly intact b/l with 10 gram monofilament. Vibratory sensation intact b/l.   Dermatological Examination: Pedal skin warm and supple b/l.   No open wounds. No interdigital macerations.  Toenails 1-5 b/l thick, discolored, elongated with subungual debris and pain on dorsal palpation.    No hyperkeratotic nor porokeratotic lesions.  Musculoskeletal Examination: Muscle strength 5/5 to all lower extremity muscle groups bilaterally. Hammertoe(s) 2-5 b/l. Pes planus deformity noted bilateral LE.  Radiographs: None  Assessment/Plan: 1. Pain due to onychomycosis of toenails of both feet   2. Controlled type 2 diabetes mellitus without complication, without long-term current use of insulin  [facility])   Consent given for treatment. Patient examined. All patient's and/or POA's  questions/concerns addressed on today's visit. Toenails 1-5 b/l debrided in length and girth without incident. Continue foot and shoe inspections daily. Monitor blood glucose per PCP/Endocrinologist's recommendations. Continue soft, supportive shoe gear daily. Report any pedal injuries to medical professional. Call office if there are any questions/concerns. -Patient/POA to call should there be question/concern in the interim.   Return in about 3 months (around 12/21/2024).  Delon LITTIE Merlin, DPM      [place] LOCATION: [address], [place] [address]                   Office [phone]   [place] LOCATION: [address] [place], [place] [address] Office [phone]     [1]  Allergies Allergen Reactions   Penicillins Nausea And Vomiting   "

## 2024-10-03 ENCOUNTER — Telehealth: Payer: Self-pay

## 2024-10-03 ENCOUNTER — Other Ambulatory Visit: Payer: Self-pay | Admitting: Family Medicine

## 2024-10-03 NOTE — Telephone Encounter (Signed)
 Spoke directly with patient advising of new appointment date and time, she requested the lab appointment be canceled and she will call to reschedule at a later time because her street has not been plowed and she will be unable to make the appointment. Appointment canceled as requested.

## 2024-10-04 ENCOUNTER — Inpatient Hospital Stay

## 2024-10-05 ENCOUNTER — Inpatient Hospital Stay

## 2024-11-15 ENCOUNTER — Inpatient Hospital Stay

## 2024-11-15 ENCOUNTER — Inpatient Hospital Stay: Admitting: Oncology

## 2024-12-28 ENCOUNTER — Ambulatory Visit: Admitting: Podiatry

## 2025-02-28 ENCOUNTER — Encounter: Admitting: Family Medicine

## 2025-04-18 ENCOUNTER — Ambulatory Visit: Admitting: Neurology

## 2025-04-28 ENCOUNTER — Ambulatory Visit
